# Patient Record
Sex: Female | Born: 1945 | Race: Black or African American | Hispanic: No | Marital: Married | Smoking: Former smoker
Health system: Southern US, Community
[De-identification: ages and names within clinical notes are randomized; demographics above are authoritative.]

## PROBLEM LIST (undated history)

## (undated) DIAGNOSIS — T8859XA Other complications of anesthesia, initial encounter: Secondary | ICD-10-CM

## (undated) DIAGNOSIS — R3129 Other microscopic hematuria: Secondary | ICD-10-CM

## (undated) DIAGNOSIS — C801 Malignant (primary) neoplasm, unspecified: Secondary | ICD-10-CM

## (undated) DIAGNOSIS — K635 Polyp of colon: Secondary | ICD-10-CM

## (undated) DIAGNOSIS — D3502 Benign neoplasm of left adrenal gland: Secondary | ICD-10-CM

## (undated) DIAGNOSIS — K769 Liver disease, unspecified: Secondary | ICD-10-CM

## (undated) DIAGNOSIS — I639 Cerebral infarction, unspecified: Secondary | ICD-10-CM

## (undated) DIAGNOSIS — Z87442 Personal history of urinary calculi: Secondary | ICD-10-CM

## (undated) DIAGNOSIS — N200 Calculus of kidney: Secondary | ICD-10-CM

## (undated) DIAGNOSIS — K219 Gastro-esophageal reflux disease without esophagitis: Secondary | ICD-10-CM

## (undated) HISTORY — DX: Polyp of colon: K63.5

## (undated) HISTORY — DX: Liver disease, unspecified: K76.9

## (undated) HISTORY — PX: ABDOMINAL HYSTERECTOMY: SHX81

## (undated) HISTORY — DX: Benign neoplasm of left adrenal gland: D35.02

## (undated) HISTORY — DX: Calculus of kidney: N20.0

## (undated) HISTORY — PX: APPENDECTOMY: SHX54

## (undated) HISTORY — DX: Other microscopic hematuria: R31.29

---
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
## 1998-06-21 ENCOUNTER — Ambulatory Visit (HOSPITAL_COMMUNITY): Admission: RE | Admit: 1998-06-21 | Discharge: 1998-06-21 | Payer: Self-pay | Admitting: Family Medicine

## 1998-07-05 ENCOUNTER — Encounter: Payer: Self-pay | Admitting: Family Medicine

## 1998-07-05 ENCOUNTER — Ambulatory Visit (HOSPITAL_COMMUNITY): Admission: RE | Admit: 1998-07-05 | Discharge: 1998-07-05 | Payer: Self-pay | Admitting: Family Medicine

## 1998-09-23 ENCOUNTER — Other Ambulatory Visit: Admission: RE | Admit: 1998-09-23 | Discharge: 1998-09-23 | Payer: Self-pay | Admitting: *Deleted

## 1999-12-12 ENCOUNTER — Other Ambulatory Visit: Admission: RE | Admit: 1999-12-12 | Discharge: 1999-12-12 | Payer: Self-pay | Admitting: *Deleted

## 2000-02-26 ENCOUNTER — Ambulatory Visit (HOSPITAL_COMMUNITY): Admission: RE | Admit: 2000-02-26 | Discharge: 2000-02-26 | Payer: Self-pay | Admitting: *Deleted

## 2000-02-26 ENCOUNTER — Encounter: Payer: Self-pay | Admitting: *Deleted

## 2000-03-13 ENCOUNTER — Encounter (INDEPENDENT_AMBULATORY_CARE_PROVIDER_SITE_OTHER): Payer: Self-pay

## 2000-03-13 ENCOUNTER — Ambulatory Visit (HOSPITAL_COMMUNITY): Admission: RE | Admit: 2000-03-13 | Discharge: 2000-03-13 | Payer: Self-pay | Admitting: *Deleted

## 2001-01-26 ENCOUNTER — Other Ambulatory Visit: Admission: RE | Admit: 2001-01-26 | Discharge: 2001-01-26 | Payer: Self-pay | Admitting: *Deleted

## 2001-02-03 ENCOUNTER — Encounter: Payer: Self-pay | Admitting: Emergency Medicine

## 2001-02-03 ENCOUNTER — Emergency Department (HOSPITAL_COMMUNITY): Admission: EM | Admit: 2001-02-03 | Discharge: 2001-02-03 | Payer: Self-pay | Admitting: Emergency Medicine

## 2001-09-05 ENCOUNTER — Encounter: Admission: RE | Admit: 2001-09-05 | Discharge: 2001-09-05 | Payer: Self-pay | Admitting: *Deleted

## 2002-02-09 ENCOUNTER — Other Ambulatory Visit: Admission: RE | Admit: 2002-02-09 | Discharge: 2002-02-09 | Payer: Self-pay | Admitting: *Deleted

## 2002-02-14 ENCOUNTER — Ambulatory Visit (HOSPITAL_COMMUNITY): Admission: RE | Admit: 2002-02-14 | Discharge: 2002-02-14 | Payer: Self-pay | Admitting: *Deleted

## 2002-02-14 ENCOUNTER — Encounter: Payer: Self-pay | Admitting: *Deleted

## 2002-03-27 ENCOUNTER — Encounter (INDEPENDENT_AMBULATORY_CARE_PROVIDER_SITE_OTHER): Payer: Self-pay | Admitting: Specialist

## 2002-03-27 ENCOUNTER — Inpatient Hospital Stay (HOSPITAL_COMMUNITY): Admission: RE | Admit: 2002-03-27 | Discharge: 2002-03-31 | Payer: Self-pay | Admitting: *Deleted

## 2003-03-27 ENCOUNTER — Ambulatory Visit (HOSPITAL_COMMUNITY): Admission: RE | Admit: 2003-03-27 | Discharge: 2003-03-27 | Payer: Self-pay | Admitting: Family Medicine

## 2003-03-29 ENCOUNTER — Other Ambulatory Visit: Admission: RE | Admit: 2003-03-29 | Discharge: 2003-03-29 | Payer: Self-pay | Admitting: *Deleted

## 2004-01-10 ENCOUNTER — Encounter: Admission: RE | Admit: 2004-01-10 | Discharge: 2004-01-10 | Payer: Self-pay | Admitting: Family Medicine

## 2004-07-16 ENCOUNTER — Other Ambulatory Visit: Admission: RE | Admit: 2004-07-16 | Discharge: 2004-07-16 | Payer: Self-pay | Admitting: Obstetrics and Gynecology

## 2006-01-06 ENCOUNTER — Encounter: Payer: Self-pay | Admitting: Family Medicine

## 2007-04-23 ENCOUNTER — Emergency Department (HOSPITAL_COMMUNITY): Admission: EM | Admit: 2007-04-23 | Discharge: 2007-04-23 | Payer: Self-pay | Admitting: Emergency Medicine

## 2007-09-30 ENCOUNTER — Encounter: Admission: RE | Admit: 2007-09-30 | Discharge: 2007-09-30 | Payer: Self-pay | Admitting: Obstetrics and Gynecology

## 2008-07-30 ENCOUNTER — Ambulatory Visit: Payer: Self-pay | Admitting: Internal Medicine

## 2008-08-13 ENCOUNTER — Encounter: Payer: Self-pay | Admitting: Internal Medicine

## 2008-08-13 ENCOUNTER — Ambulatory Visit: Payer: Self-pay | Admitting: Internal Medicine

## 2008-08-15 ENCOUNTER — Encounter: Payer: Self-pay | Admitting: Internal Medicine

## 2011-01-23 NOTE — Op Note (Signed)
[facility]  Patient:    Tonya [name], Tonya [name] Visit Number: [id number] MRN: [id number]          Service Type: GYN Location: 9300 [id number] Attending Physician:  Donne Hazel Dictated by:   Willey Blade, M.D. Proc. Date: 03/27/02 Admit Date:  03/27/2002                             Operative Report  PREOPERATIVE DIAGNOSIS:       Left ovarian cyst.  POSTOPERATIVE DIAGNOSIS:      Left ovarian cyst.  PROCEDURES:                   1. Laparotomy.                               2. Left salpingo-oophorectomy.                               3. Lysis of adhesions.  SURGEON:                      Willey Blade, M.D.  ASSISTANT:                    Duke Salvia. Marcelle Overlie, M.D.  ANESTHESIA:                   General.  ESTIMATED BLOOD LOSS:         100 cc.  COMPLICATIONS:                None.  FINDINGS:                     At the time of laparotomy, pelvic adhesions were identified.  The greater omentum was densely adherent to the anterior abdominal wall.  The left ovary was enlarged to about 6 cm in size and densely adherent to the left pelvic side wall and the pelvic floor.  Dense adhesions were noted between the sigmoid colon and the left ovary.  The ovarian cyst was 6 cm in size, clear and simple in appearance.  This was removed intact.  Otherwise, the uterus and right adnexa was surgical absent.  No known abnormality was identified.  DESCRIPTION OF PROCEDURE:     The patient was taken to the operating room, where general endotracheal anesthetic was administered.  The patient was placed on the operating table in the supine position.  The abdomen was prepped and draped in the usual sterile fashion with Betadine and sterile drapes.  A Foley catheter was inserted.  The patient received a preoperative antibiotic. The abdomen was entered through a Pfannenstiel incision and carried down sharply in the usual fashion.  The peritoneum was atraumatically  entered.  A self-retaining retractor was then placed and the bowel was packed away with laparotomy packs.  The retroperitoneal space was entered sharply on the left and the ovary was dissected free from the left pelvic side wall, the pelvic floor and the colon.  Careful surgical technique was undertaken with this dissection.  The infundibulopelvic ligament was isolated, clamped with a Heaney clamp and doubly ligated with two free ties of 0 Vicryl suture.  The ovary was then dissected from the pelvic floor and submitted for pathologic examination.  This did appear to  be a simple ovarian cyst.  The pelvic adhesions were quite dense between this and the pelvic side wall.  The ureter was identified during this dissection.  Hemostasis was then achieved.  The pelvis was thoroughly irrigated with copious amounts of irrigant.  Hemostasis was noted.  Attention was then turned to closure.  All abdominal instruments and packs were removed from the pelvis.  Good hemostasis was again noted.  The rectus muscle and anterior peritoneum was closed with three interrupted sutures of 0 Vicryl.  The muscle fascia was then closed with two sutures of #1 Vicryl in a running fashion.  Two sutures were placed at the periphery of the incision and, with running stitches, meeting in the midline.    The subcutaneous tissue was irrigated and made hemostatic using the Bovie cautery. The skin was reapproximated with staples and a sterile dressing applied.  Final sponge, needle and instrument counts were correct x3.  There were no perioperative complications.  The surgery went well. Dictated by:   Willey Blade, M.D. Attending Physician:  Donne Hazel DD:  03/27/02 TD:  03/29/02 Job: [id number]

## 2011-01-23 NOTE — Discharge Summary (Signed)
   NAMESTEPHAN, Tonya [name]                        ACCOUNT NO.:  [id number]   MEDICAL RECORD NO.:  [id number]                   PATIENT TYPE:   LOCATION:                                       FACILITY:   PHYSICIAN:  Tracie Harrier, M.D.              DATE OF BIRTH:   DATE OF ADMISSION:  03/27/2002  DATE OF DISCHARGE:                                 DISCHARGE SUMMARY   HISTORY OF PRESENT ILLNESS:  The patient is a 65 year old female admitted  for laparotomy and left salpingo-oophorectomy for a persistent left ovarian  cyst which has caused her great discomfort.  She is a smoker but otherwise  very healthy.  Informed consent was given on this ovarian cyst which is  simple in nature and no evidence of carcinoma.   PAST MEDICAL HISTORY:  1. History of smoking.  2. History of GERD.   PAST SURGICAL HISTORY:  1. Status post TAH/RSO.  2. Vaginal delivery x3.  3. Appendectomy.  4. Status post laparoscopy with left ovarian cystectomy.   OBSTETRICAL HISTORY:  Normal spontaneous vaginal delivery x3 at term.   CURRENT MEDICATIONS:  None.   ALLERGIES:  PENICILLIN.   PHYSICAL EXAMINATION:  Please seen clinic admission History and Physical.   ADMISSION DIAGNOSES:  1. Left ovarian cyst.  2. Pelvic pain.   HOSPITAL COURSE:  The patient was admitted on the same day of surgery, March 27, 2002, where she underwent a left salpingo-oophorectomy.  Pelvic  adhesions were encountered but the surgery went well and uneventfully.  The  patient had a slow postoperative course but it otherwise went well.  She did  have a low-grade fever during her hospitalization which was felt secondary  to atelectasis.  She resumed normal bowel and bladder function and is  discharged routinely postoperative day #4 in stable condition.  Pathology  came back benign.  She had no real complications and was discharged  routinely in stable condition on postop day #4 as mentioned.  Her hemoglobin  stabilized at 9.8.   DISCHARGE DIAGNOSIS:  Status post laparotomy with left salpingo-oophorectomy-  -stable.   PLAN:  1. Home.  2.     Routine instructions given.  3. Percocet dispense #40.  4. Followup in the office in one week for a routine postop and incision     check.                                               Tracie Harrier, M.D.    REG/MEDQ  D:  05/06/2002  T:  05/06/2002  Job:  [id number]

## 2011-01-23 NOTE — Op Note (Signed)
[facility]  Patient:    Tonya [name], Tonya [name]                     MRN: [id number] Proc. Date: 03/13/00 Adm. Date:  [id number] Attending:  Donne Hazel                           Operative Report  PREOPERATIVE DIAGNOSIS:       Symptomatic left ovarian cyst.  POSTOPERATIVE DIAGNOSIS:      Symptomatic left ovarian cyst with benign appearance.  PROCEDURE:                    1. Laparoscopic left ovarian cystectomy.                               2. Lysis of adhesions.  SURGEON:                      Dr. Alan Mulder  ANESTHESIA:                   General endotracheal.  ESTIMATED BLOOD LOSS:         Less than 20 cc.  COMPLICATIONS:                None.  FINDINGS:                     At time of surgery, a large left ovarian cyst was encountered.  This was approximately 7 cm in size.  It was benign in appearance.  The cyst was fluid filled and smooth walled without worrisome areas of malignancy.  This did appear to be completely benign.  The uterus and right adnexa were surgically absent.  The left ovary was densely adherent to the sigmoid colon.  DESCRIPTION OF PROCEDURE:     The patient was taken to the operating room where general endotracheal anesthetic was administered.  The patient was placed on the operating table in the dorsal lithotomy position.  The abdomen, perineum and vagina was prepped and draped in the usual sterile fashion with Betadine and sterile drapes.  The bladder was emptied with a red rubber catheter.  A sponge forceps was placed in the vagina to identify the vaginal cuff.  Next, a small vertical infraumbilical skin incision was made through which a Veresss needle was inserted atraumatically.  The Veres needle was then used to create a pneumoperitoneum by insufflating 2.2 liters of carbon dioxide gas.  This was done atraumatically.  The Veress needle was removed and a disposable laparoscopic trocar was inserted atraumatically  into the abdominal cavity.  Next, an accessory incision was made two fingerbreadths above the pubic symphysis and in the midline.  Through this a 5 mm trocar was placed under direct, continuous laparoscopic guidance.  Inspection of the abdomen was undertaken.  The left ovarian cyst was encountered.  This was grasped and elevated into the operative field.  The ovarian cortex was cauterized with bipolar cautery after a biopsy of the cyst wall was obtained.  The cyst wall was removed and approximately 1/3 and this was removed through the laparoscopic sheath.  The base of the cyst wall was then cauterized.  Good hemostasis was noted.  There were some pelvic adhesions encountered and these were lysed.  These were most notably between the left ovary and the  left pelvic side wall.  As mentioned, the ovary was densely adherent to the sigmoid colon.  If this ovary was to be removed, it would be best approached through an open procedure.  At the end of the cystectomy and lysis of adhesions, the pelvis was visualized with good hemostasis noted.  Attention was then turned to closure.  All the gas was allowed to escape from the abdomen and all abdominal instruments were removed.  The infraumbilical skin incision was closed, first with two interrupted sutures of #0 Vicryl on the muscle fascia, then the skin was reapproximated with interrupted sutures of 3-0 Vicryl ______.  The patient was awakened, extubated and taken to the recovery room in good condition. There were no preoperative complications.  DD:  03/13/00 TD:  03/13/00 Job: [id number]

## 2011-01-23 NOTE — H&P (Signed)
[facility]  Patient:    Tonya [name], Tonya [name] Visit Number: [id number] MRN: [id number]          Service Type: GYN Location: [phone] Attending Physician:  Donne Hazel Dictated by:   Willey Blade, M.D. Admit Date:  03/27/2002                           History and Physical  HISTORY OF PRESENT ILLNESS:   The patient is a 65 year old postmenopausal female status post TAH and RSO.  She is admitted for laparotomy with left salpingo-oophorectomy for a persistent left ovarian cyst which has caused her great discomfort.  She is otherwise pretty healthy.  She is a smoker.  The patient was given informed consent.  This large ovarian cyst is extremely simple in nature and the patient has a history of a similar right ovarian cyst.  MEDICAL HISTORY:              History of GERD.  SURGICAL HISTORY:             1. Status post TAH/RSO.                               2. Vaginal delivery x3.                               3. Appendectomy.                               4. Status post laparoscopy with left ovarian                                  cystectomy.  OBSTETRICAL HISTORY:          Normal spontaneous vaginal delivery x3 at term.  CURRENT MEDICATIONS:          None.  ALLERGIES:                    PENICILLIN.  PHYSICAL EXAMINATION:  VITAL SIGNS:                  Stable, temperature 98, pulse 79, respirations 16, blood pressure 105/68.  GENERAL:                      She is a well-developed, well-nourished female in no acute distress.  HEENT:                        Within normal limits.  NECK:                         Supple without adenopathy or thyromegaly.  HEART:                        Regular rate and rhythm without murmur, gallop, or rub.  LUNGS:                        Clear.  BREASTS:                      No masses, nipple  discharge, or axillary adenopathy.  ABDOMEN:                      Soft and benign.  PELVIC:                        Normal external female genitalia, vagina clear. The uterus and right adnexa are surgically absent.  The left adnexa is full with a left adnexal fullness appreciated.  This is tender to palpation.  The rectal confirms.  EXTREMITIES AND NEUROLOGIC:   Grossly normal.  LABORATORY DATA:              Ultrasound reveals a 6 cm left ovarian cyst which is simple in nature.  ADMITTING DIAGNOSIS:          Left ovarian cyst - simple in nature.  PLAN:                         Laparotomy with left salpingo-oophorectomy.  DISCUSSION:                   The risks and benefits of this surgery explained to the patient.  I have done laparoscopy on this patient in the past and pelvic adhesions were noted.  I have recommended laparotomy with left salpingo-oophorectomy in this situation.  The patient wishes to proceed. Questions were answered. Dictated by:   Willey Blade, M.D. Attending Physician:  Donne Hazel DD:  03/27/02 TD:  03/27/02 Job: [id number]

## 2011-02-12 ENCOUNTER — Ambulatory Visit (HOSPITAL_BASED_OUTPATIENT_CLINIC_OR_DEPARTMENT_OTHER)
Admission: RE | Admit: 2011-02-12 | Discharge: 2011-02-12 | Disposition: A | Discharge status: Home / Self Care | Payer: BC Managed Care – PPO | Source: Ambulatory Visit | Attending: Orthopedic Surgery | Admitting: Orthopedic Surgery

## 2011-02-12 DIAGNOSIS — G5620 Lesion of ulnar nerve, unspecified upper limb: Secondary | ICD-10-CM | POA: Insufficient documentation

## 2011-02-12 DIAGNOSIS — Z01812 Encounter for preprocedural laboratory examination: Secondary | ICD-10-CM | POA: Insufficient documentation

## 2011-02-12 DIAGNOSIS — F17200 Nicotine dependence, unspecified, uncomplicated: Secondary | ICD-10-CM | POA: Insufficient documentation

## 2011-02-17 NOTE — Op Note (Signed)
Tonya [name], [name]            ACCOUNT NO.:  [id number]  MEDICAL RECORD NO.:  [id number]  LOCATION:                                 FACILITY:  PHYSICIAN:  Katy Fitch. Duaa Stelzner, M.D.      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  OPERATION:  Anterior subcutaneous transposition of right ulnar nerve.  OPERATING SURGEON:  Katy Fitch. Verdis Bassette, MD  ASSISTANT:  Marveen Reeks Dasnoit, PA  ANESTHESIA:  General by LMA supplemented by a right plexus block.  SUPERVISING ANESTHESIOLOGIST:  Janetta Hora. Gelene Mink, MD  INDICATIONS:  Tonya [name] is a 65 year old woman employed as a Merchandiser, retail at [employer].  She has a history of increasing numbness and weakness of her right hand.  She was referred by her primary care physician, Alwyn Pea, for evaluation and management. Clinical examination revealed signs of profound ulnar intrinsic muscle atrophy and loss of sensibility in the ulnar nerve distribution of the right upper extremity distal to the elbow.  She was noted have a markedly positive Tinel sign at the cubital tunnel and positive elbow hyperflexion test.  Electrodiagnostic study was completed on Jan 19, 2011, documented profound ulnar neuropathy with a conduction velocity of only 22 meters per second on the right versus a normal conduction velocity on the left.  We recommended that Tonya [name] undergo exploration of her ulnar nerve anticipating an anterior transposition.  We discussed the various techniques with her.  Our first choice  will be anterior subcutaneous transposition.  After informed consent, she was brought to the operating room at this time.  Preoperatively, questions were invited and answered in detail. She has been seen by Dr. Gelene Mink in the holding area.  After informed anesthesia consent, a right plexus block was placed with ultrasound control.  This was well tolerated, leading to excellent anesthesia of the right upper extremity.  PROCEDURE:  Tonya [name] was brought to room one of the [facility] and placed in supine position on the operating table.  Following the induction of general anesthesia by LMA technique under Dr. Thornton Dales direct supervision, the right arm was prepped with Betadine soap and solution and sterilely draped.  A 1 g of vancomycin was administered as an IV prophylactic antibiotic due to a PENICILLIN allergy.  After routine surgical time-out, the right arm was exsanguinated with Esmarch bandage and arterial tourniquet to the proximal brachium inflated to 220 mmHg.  Procedure commenced with a 4-cm curvilinear incision paralleling the path of the ulnar nerve posterior to the medial epicondyle. Subcutaneous tissues were divided taking care to identify and spare the posterior branch of the medial antebrachial cutaneous nerve.  The ulnar nerve was identified approximately 6 cm above the medial epicondyle.  This was dissected and decompressed distally 4 cm below the epicondyle.  The flexor carpi ulnaris  was resected.  The medial brachial intermuscular septum was identified, neurovascular structures protected, and septum excised with cutting cautery.  With great care using a silicone vessel loop, the nerve was mobilized with a vascular pedicle transposing it anterior to the medial epicondyle into a subcutaneous pocket.  Care was taken to preserve all of the epineural vessels.  Great care was taken to assure that there no kinking vessels or other fascial loops  proximal and distally.  After the nerve was completely transposed to a proper bed of fascia and fat, a flap was created incorporating the superficial fascia which was sewn to the epicondyle creating a retaining wall not allowing the nerve to sublux posteriorly.  The ulnar nerve was noted to be swollen to approximately 60% greater diameter directly at the epicondyle.  Prior to transposition, we noted that the nerve was shifting anteriorly with double flexion beyond 90 degrees through an amplitude of approximately 1 cm.  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  After completion of transposition, bleeding points were electrocauterized with bipolar current followed by repair of the skin with subcutaneous suture of 3-0 Vicryl and intradermal 3-0 Prolene. Compressive dressing was applied with Steri-Strips, sterile gauze, Tegaderm dressing, and Ace wrap.  For aftercare, Tonya [name] will be placed in a sling.  Once her block was off, she will begin immediate range of motion and excise to the elbow and hand.  There were no apparent complications.     Katy Fitch Sala Tague, M.D.     RVS/MEDQ  D:  02/12/2011  T:  02/12/2011  Job:  [id number]  cc:   Tonya [name], M.D.  Electronically Signed by Josephine Igo M.D. on 02/17/2011 02:27:07 PM

## 2011-06-19 LAB — COMPREHENSIVE METABOLIC PANEL
AST: 14
Albumin: 3.5
BUN: 7
Chloride: 108
Creatinine, Ser: 0.55
GFR calc non Af Amer: >60

## 2011-06-19 LAB — CBC
HCT: 35.4 — ABNORMAL LOW
Hemoglobin: 12.1
MCV: 97.7
Platelets: 290
RBC: 3.63 — ABNORMAL LOW
RDW: 12.7

## 2011-06-19 LAB — POCT CARDIAC MARKERS
Myoglobin, poc: 37.7
Operator id: 196461
Troponin i, poc: <0.05

## 2011-06-19 LAB — URINALYSIS, ROUTINE W REFLEX MICROSCOPIC
Bilirubin Urine: NEGATIVE
Nitrite: NEGATIVE
Specific Gravity, Urine: 1.015
Urobilinogen, UA: 0.2

## 2011-06-19 LAB — DIFFERENTIAL
Eosinophils Relative: 2
Lymphocytes Relative: 39
Lymphs Abs: 2.9

## 2012-06-30 ENCOUNTER — Encounter (HOSPITAL_COMMUNITY): Payer: Self-pay | Admitting: *Deleted

## 2012-06-30 ENCOUNTER — Emergency Department (HOSPITAL_COMMUNITY): Payer: BC Managed Care – PPO

## 2012-06-30 ENCOUNTER — Inpatient Hospital Stay (HOSPITAL_COMMUNITY): Payer: BC Managed Care – PPO

## 2012-06-30 ENCOUNTER — Inpatient Hospital Stay (HOSPITAL_COMMUNITY)
Admission: EM | Admit: 2012-06-30 | Discharge: 2012-07-01 | DRG: 035 | Disposition: A | Discharge status: Home / Self Care | Payer: BC Managed Care – PPO | Attending: Internal Medicine | Admitting: Internal Medicine

## 2012-06-30 DIAGNOSIS — R079 Chest pain, unspecified: Secondary | ICD-10-CM

## 2012-06-30 DIAGNOSIS — K219 Gastro-esophageal reflux disease without esophagitis: Secondary | ICD-10-CM | POA: Diagnosis present

## 2012-06-30 DIAGNOSIS — Z79899 Other long term (current) drug therapy: Secondary | ICD-10-CM

## 2012-06-30 DIAGNOSIS — I6350 Cerebral infarction due to unspecified occlusion or stenosis of unspecified cerebral artery: Secondary | ICD-10-CM

## 2012-06-30 DIAGNOSIS — R0789 Other chest pain: Secondary | ICD-10-CM | POA: Diagnosis present

## 2012-06-30 DIAGNOSIS — I6340 Cerebral infarction due to embolism of unspecified cerebral artery: Secondary | ICD-10-CM

## 2012-06-30 DIAGNOSIS — G8190 Hemiplegia, unspecified affecting unspecified side: Secondary | ICD-10-CM

## 2012-06-30 DIAGNOSIS — I498 Other specified cardiac arrhythmias: Secondary | ICD-10-CM | POA: Diagnosis present

## 2012-06-30 DIAGNOSIS — E785 Hyperlipidemia, unspecified: Secondary | ICD-10-CM

## 2012-06-30 DIAGNOSIS — R202 Paresthesia of skin: Secondary | ICD-10-CM | POA: Diagnosis present

## 2012-06-30 DIAGNOSIS — R209 Unspecified disturbances of skin sensation: Principal | ICD-10-CM | POA: Diagnosis present

## 2012-06-30 DIAGNOSIS — F17200 Nicotine dependence, unspecified, uncomplicated: Secondary | ICD-10-CM | POA: Diagnosis present

## 2012-06-30 DIAGNOSIS — G459 Transient cerebral ischemic attack, unspecified: Secondary | ICD-10-CM | POA: Diagnosis present

## 2012-06-30 DIAGNOSIS — R200 Anesthesia of skin: Secondary | ICD-10-CM | POA: Diagnosis present

## 2012-06-30 HISTORY — DX: Gastro-esophageal reflux disease without esophagitis: K21.9

## 2012-06-30 LAB — RAPID URINE DRUG SCREEN, HOSP PERFORMED
Amphetamines: NOT DETECTED
Barbiturates: NOT DETECTED
Benzodiazepines: NOT DETECTED
Tetrahydrocannabinol: NOT DETECTED

## 2012-06-30 LAB — TROPONIN I: Troponin I: <0.3 ng/mL (ref <0.30)

## 2012-06-30 LAB — CBC
HCT: 37.8 % (ref 36.0–46.0)
Hemoglobin: 12.8 g/dL (ref 12.0–15.0)
MCV: 97.2 fL (ref 78.0–100.0)
RBC: 3.89 MIL/uL (ref 3.87–5.11)
WBC: 6.5 10*3/uL (ref 4.0–10.5)

## 2012-06-30 LAB — LIPID PANEL
Cholesterol: 243 mg/dL — ABNORMAL HIGH (ref 0–200)
HDL: 53 mg/dL (ref >39)
Triglycerides: 109 mg/dL (ref <150)

## 2012-06-30 LAB — BASIC METABOLIC PANEL
BUN: 9 mg/dL (ref 6–23)
Calcium: 9.2 mg/dL (ref 8.4–10.5)

## 2012-06-30 MED ORDER — ASPIRIN EC 81 MG PO TBEC
81.0000 mg | DELAYED_RELEASE_TABLET | Freq: Every day | ORAL | Status: DC
  Administered 2012-06-30 – 2012-07-01 (×2): 81 mg via ORAL
  Filled 2012-06-30 (×3): qty 1

## 2012-06-30 MED ORDER — OXYCODONE-ACETAMINOPHEN 5-325 MG PO TABS
1.0000 | ORAL_TABLET | ORAL | Status: DC | PRN
  Administered 2012-06-30 – 2012-07-01 (×4): 1 via ORAL
  Filled 2012-06-30 (×4): qty 1

## 2012-06-30 MED ORDER — ASPIRIN 81 MG PO CHEW
324.0000 mg | CHEWABLE_TABLET | Freq: Once | ORAL | Status: DC
  Filled 2012-06-30: qty 3

## 2012-06-30 MED ORDER — PANTOPRAZOLE SODIUM 40 MG PO TBEC
40.0000 mg | DELAYED_RELEASE_TABLET | Freq: Every day | ORAL | Status: DC
  Administered 2012-07-01: 40 mg via ORAL
  Filled 2012-06-30: qty 1

## 2012-06-30 MED ORDER — ATORVASTATIN CALCIUM 80 MG PO TABS
80.0000 mg | ORAL_TABLET | Freq: Every day | ORAL | Status: DC
  Administered 2012-07-01: 80 mg via ORAL
  Filled 2012-06-30: qty 1

## 2012-06-30 MED ORDER — ACETAMINOPHEN 325 MG PO TABS
650.0000 mg | ORAL_TABLET | ORAL | Status: DC | PRN
  Administered 2012-06-30: 650 mg via ORAL
  Filled 2012-06-30: qty 2

## 2012-06-30 MED ORDER — NITROGLYCERIN 0.4 MG SL SUBL
0.4000 mg | SUBLINGUAL_TABLET | SUBLINGUAL | Status: DC | PRN
  Administered 2012-06-30: 0.4 mg via SUBLINGUAL
  Filled 2012-06-30: qty 25

## 2012-06-30 NOTE — ED Notes (Signed)
Pt denies chest pain, shortness of breath, dizziness, nausea, lightheadedness. Pt c/o slight headache from not eating and from Nitro. Pt is mentating appropriately at this time.

## 2012-06-30 NOTE — ED Provider Notes (Signed)
History     CSN: [id number]  Arrival date & time 06/30/12  1350   None     Chief Complaint  Patient presents with  . Numbness    (Consider location/radiation/quality/duration/timing/severity/associated sxs/prior treatment) HPI Comments: Patient presents today with a chief complaint of numbness and tingling of the left side of her body.  She states that last evening around 11 pm she developed numbness and tingling of her left arm.  When she woke up this morning around 6 AM she had numbness and tingling of her left leg, left side of her tongue, and left side of her lips.  The numbness and tingling have been constant since that time.  She also states that approximately 7 hours ago she developed pressure of the left anterior chest.  Pain not associated with any SOB, nausea, vomiting, or diaphoresis.  No dizziness, lightheadedness, or syncope.  She denies any weakness.  Denies slurred speech.  Denies dysphagia.  Denies facial asymmetry.  Patient denies prior history of CVA, TIA, or cardiac disease.  She does have a history of Hyperlipidemia.  She also smokes daily.  No prior history of HTN or DM.    The history is provided by the patient.    History reviewed. No pertinent past medical history.  Past Surgical History  Procedure Date  . Abdominal hysterectomy   . Appendectomy     No family history on file.  History  Substance Use Topics  . Smoking status: Current Every Day Smoker  . Smokeless tobacco: Not on file  . Alcohol Use: Yes    OB History    Grav Para Term Preterm Abortions TAB SAB Ect Mult Living                  Review of Systems  Constitutional: Negative for fever and chills.  HENT: Negative for neck pain and neck stiffness.   Eyes: Negative for visual disturbance.  Respiratory: Negative for shortness of breath.   Cardiovascular: Positive for chest pain.  Gastrointestinal: Negative for nausea and vomiting.  Musculoskeletal: Negative for back pain and gait  problem.  Neurological: Positive for numbness. Negative for dizziness, syncope, facial asymmetry, speech difficulty, weakness, light-headedness and headaches.  Psychiatric/Behavioral: Negative for confusion.    Allergies  Penicillins  Home Medications   Current Outpatient Rx  Name Route Sig Dispense Refill  . CALCIUM + D PO Oral Take 1 tablet by mouth daily.    . ADULT MULTIVITAMIN W/MINERALS CH Oral Take 1 tablet by mouth daily.    . OXYCODONE-ACETAMINOPHEN 7.5-325 MG PO TABS Oral Take 1 tablet by mouth every 4 (four) hours as needed.    Marland Kitchen RABEPRAZOLE SODIUM 20 MG PO TBEC Oral Take 20 mg by mouth daily.    Marland Kitchen ROSUVASTATIN CALCIUM 10 MG PO TABS Oral Take 10 mg by mouth daily.      BP 98/70  Pulse 56  Temp 97.8 F (36.6 C) (Oral)  Resp 20  Ht 5\' 4"  (1.626 m)  Wt 148 lb (67.132 kg)  BMI 25.40 kg/m2  SpO2 98%  Physical Exam  Nursing note and vitals reviewed. Constitutional: She is oriented to person, place, and time. She appears well-developed and well-nourished. No distress.  HENT:  Head: Normocephalic and atraumatic.  Mouth/Throat: Oropharynx is clear and moist.  Eyes: EOM are normal. Pupils are equal, round, and reactive to light.  Neck: Normal range of motion. Neck supple.  Cardiovascular: Normal rate, regular rhythm and normal heart sounds.   Pulmonary/Chest: Effort normal  and breath sounds normal. No respiratory distress. She has no wheezes. She has no rales. She exhibits no tenderness.  Neurological: She is alert and oriented to person, place, and time. She has normal strength. No cranial nerve deficit or sensory deficit. Coordination and gait normal.       No pronator drift No visual field defect No ataxia Normal rapid alternating movements Normal finger to nose testing  Skin: Skin is warm and dry. She is not diaphoretic.  Psychiatric: She has a normal mood and affect.    ED Course  Procedures (including critical care time)   Labs Reviewed  CBC  POCT I-STAT  TROPONIN I  BASIC METABOLIC PANEL   No results found.   No diagnosis found.   Date: 06/30/2012  Rate: 59  Rhythm: sinus bradycardia  QRS Axis: normal  Intervals: normal  ST/T Wave abnormalities: nonspecific T wave changes  Conduction Disutrbances:none  Narrative Interpretation:   Old EKG Reviewed: changes present, new t wave inversion in leads II and III  Discussed patient with Dr. Rhunette Croft.  He recommends consulting Neurology.  3:46 PM Discussed with Dr. Thad Ranger who reports that she will come see the patient in the ED.  5:59 PM Discussed with Hospitalist.  They report that they will come see the patient in the ED.  MDM  Patient presents to the ED today with a chief complaint of numbness/tingling of the left side of her body.  Onset of symptoms last evening.  No weakness, facial asymmetry, or dysphagia.  Consulted Neurology who recommended the patient having a MRI, MRA, Echo, and Carotid Doppler.  Patient admitted to Internal Medicine service for further management and work up.  Patient also presenting with chest pain.  Patient given Aspirin and NG while in the ED, which helped with the pain somewhat.  Initial and 3 hour troponin negative.          Pascal Lux [place], PA-C 07/01/12 1255

## 2012-06-30 NOTE — Consult Note (Signed)
Referring Physician: Dr. Rhunette Croft    Chief Complaint: left sided numbness/weakness  HPI: Tonya [name] is an 66 y.o. female who noted left sided weakness/numbness of extremities around 2230 on 06/29/2012. She went to bed and awoke with the same. She then went to work and around 0800 today (06/30/2012) she noted that her symptoms were worse and that her face was now included. She did not have problems swallowing or speaking. She did not have difficulty with gait. She came home at lunch and asked her husband to bring her to the hospital. She did take a ASA 325mg  at bedtime last night and again this morning. She does not take aspirin usually.  LSN: [id number] on 06/29/2012 tPA Given: No: Out of window.  Past Medical History: Hyperlipidemia  Past Surgical History  Procedure Date  . Abdominal hysterectomy   . Appendectomy     Family history:  Maternal aunt with stroke  Social History:  reports that she has been smoking.  She does not have any smokeless tobacco history on file. She reports that she drinks alcohol. She reports that she does not use illicit drugs.  Allergies:  Allergies  Allergen Reactions  . Penicillins     REACTION: hives    Current Facility-Administered Medications  Medication Dose Route Frequency Provider Last Rate Last Dose  . aspirin chewable tablet 324 mg  324 mg Oral Once Magnus Sinning, PA-C       Current Outpatient Prescriptions  Medication Sig Dispense Refill  . Calcium Carbonate-Vitamin D (CALCIUM + D PO) Take 1 tablet by mouth daily.      . Multiple Vitamin (MULTIVITAMIN WITH MINERALS) TABS Take 1 tablet by mouth daily.      Marland Kitchen oxyCODONE-acetaminophen (PERCOCET) 7.5-325 MG per tablet Take 1 tablet by mouth every 4 (four) hours as needed.      . RABEprazole (ACIPHEX) 20 MG tablet Take 20 mg by mouth daily.      . rosuvastatin (CRESTOR) 10 MG tablet Take 10 mg by mouth daily.        ROS: History obtained from spouse and chart review  General ROS:  negative for - chills, fatigue, fever, night sweats, weight gain or weight loss Psychological ROS: negative for - behavioral disorder, hallucinations, memory difficulties, mood swings or suicidal ideation Ophthalmic ROS: negative for - blurry vision, double vision, eye pain or loss of vision ENT ROS: negative for - epistaxis, nasal discharge, oral lesions, sore throat, tinnitus or vertigo Allergy and Immunology ROS: negative for - hives or itchy/watery eyes Hematological and Lymphatic ROS: negative for - bleeding problems, bruising or swollen lymph nodes Endocrine ROS: negative for - galactorrhea, hair pattern changes, polydipsia/polyuria or temperature intolerance Respiratory ROS: negative for - cough, hemoptysis, shortness of breath or wheezing Cardiovascular ROS: negative for - chest pain, dyspnea on exertion, edema or irregular heartbeat Gastrointestinal ROS: negative for - abdominal pain, diarrhea, hematemesis, nausea/vomiting or stool incontinence Genito-Urinary ROS: negative for - dysuria, hematuria, incontinence or urinary frequency/urgency Musculoskeletal ROS: negative for - joint swelling, +muscular weakness (left body) with numbness including face Neurological ROS: as noted in HPI Dermatological ROS: negative for rash and skin lesion changes    Physical Examination: Blood pressure 98/70, pulse 56, temperature 97.8 F (36.6 C), temperature source Oral, resp. rate 20, height 5\' 4"  (1.626 m), weight 67.132 kg (148 lb), SpO2 98.00%.  Neurologic Examination: Mental Status: Alert, oriented, thought content appropriate.  Speech fluent without evidence of aphasia.  Able to follow 3 step commands without difficulty.  Cranial Nerves: II: visual fields grossly normal, pupils equal, round, reactive to light and accommodation III,IV, VI: ptosis not present, extra-ocular motions intact bilaterally V,VII: smile symmetric, facial light touch sensation abnormal, describes feeling numb on left,  splits midline. VIII: hearing normal bilaterally IX,X: gag reflex present XI: trapezius strength/neck flexion strength normal bilaterally XII: tongue strength normal  Motor: Right : Upper extremity   5/5    Left:     Upper extremity   4+/5  Lower extremity   5/5     Lower extremity   4+/5 Tone and bulk:normal tone throughout; no atrophy noted Sensory: Pinprick and light touch diminished left body including the trunk Deep Tendon Reflexes: 2+ and symmetric throughout Plantars: Right: downgoing   Left: downgoing Cerebellar: normal finger-to-nose, normal heel-to-shin test.    Results for orders placed during the hospital encounter of 06/30/12 (from the past 48 hour(s))  CBC     Status: Normal   Collection Time   06/30/12  2:12 PM      Component Value Range Comment   WBC 6.5  4.0 - 10.5 K/uL    RBC 3.89  3.87 - 5.11 MIL/uL    Hemoglobin 12.8  12.0 - 15.0 g/dL    HCT 16.1  09.6 - 04.5 %    MCV 97.2  78.0 - 100.0 fL    MCH 32.9  26.0 - 34.0 pg    MCHC 33.9  30.0 - 36.0 g/dL    RDW 40.9  81.1 - 91.4 %    Platelets 277  150 - 400 K/uL   BASIC METABOLIC PANEL     Status: Abnormal   Collection Time   06/30/12  2:12 PM      Component Value Range Comment   Sodium 137  135 - 145 mEq/L    Potassium 4.0  3.5 - 5.1 mEq/L    Chloride 103  96 - 112 mEq/L    CO2 23  19 - 32 mEq/L    Glucose, Bld 100 (*) 70 - 99 mg/dL    BUN 9  6 - 23 mg/dL    Creatinine, Ser 7.82  0.50 - 1.10 mg/dL    Calcium 9.2  8.4 - 95.6 mg/dL    GFR calc non Af Amer >90  >90 mL/min    GFR calc Af Amer >90  >90 mL/min   POCT I-STAT TROPONIN I     Status: Normal   Collection Time   06/30/12  2:28 PM      Component Value Range Comment   Troponin i, poc 0.01  0.00 - 0.08 ng/mL    Comment 3             Dg Chest 2 View 06/30/2012  Enlargement of cardiac silhouette. Minimal bronchitic and interstitial changes of uncertain acuity. No definite focal infiltrate identified.   Original Report Authenticated By: Lollie Marrow,  M.D.    Job Founds, MBA, MHA [facility] Pager [phone]   Patient seen and examined.  Clinical course and management discussed.  Necessary edits performed.  I agree with the above.   Assessment and plan discussed below.   Assessment: 66 y.o. female with left sided weakness and numbness now for 18 hours.  CT unremarkable.  Suspect right brain infarct.  Further work up recommended.  Patient on no antiplatelet therapy at home.    Stroke Risk Factors - hyperlipidemia  Plan: 1. HgbA1c, fasting lipid panel 2. MRI, MRA  of the brain without contrast 3. PT  consult, OT consult 4. Echocardiogram 5. Carotid dopplers 6. Prophylactic therapy-Antiplatelet med: Aspirin - dose 325mg  daily 7. Risk factor modification.  Smoking cessation couseling 8. Telemetry monitoring 9. Frequent neuro checks   Thana Farr, MD [facility] [phone] 06/30/2012  7:08 PM

## 2012-06-30 NOTE — H&P (Signed)
Hospital Admission Note Date: 06/30/2012  Patient name: Tonya [name] Medical record number: [id number] Date of birth: 23-Oct-1945 Age: 66 y.o. Gender: female PCP: No primary provider on file.  Medical Service: Internal Medicine Teaching Service  Attending physician:  Lars Mage, MD     1st Contact: Dr Dow Adolph   Pager: [phone] 2nd Contact: Dr Elberta Fortis   Pager:[phone] After 5 pm or weekends: 1st Contact:      Pager: [phone] 2nd Contact:      Pager: [phone]  Chief Complaint: Reduced left sided for about 18hrs and chest pain.  History of Present Illness: [name] is a 66 year old woman, with past medical history only significant for hyperlipidemia, and GERD , who presents with left-sided tingling. She reports that last night at about 10 PM, she started experiencing some tingling from the left side of her face, left and left leg. In addition, she has also noticed numbness in her tongue, but she did notice any difficulties moving her tongue or swallowing. She went to bed and when she woke up at around 6 AM, she continued to experience the same symptoms and she believed they had only slightly worsened. However, she still felt that it would improve as the day goes on. She went to work, but the symptoms did not improve. She drove home and requested her her husband to bring her to the ED. She thinks she could have driven herself. She denies history of weakness, imbalance, dizziness, visual changes, or hearing deficits. She denies history of falls. She does not report any history of slurred speech or difficulty finding words. She denies previous history of similar symptoms. She denies any symptoms on the right side - ' my body feels like is split into two; with the right side completely normal' Her only medications include Crestor started 6 months ago, and antiacids.   She also complained of chest discomfort, which she describes as pressure and tightness, that is mainly located in  the central chest and to the left side of her chest as well. The chest pain started in the morning and it progressively got worse. It did not improve with aspirin she took at home. Her pain does not radiate to the left shoulder or jaw. However, she has some area of burning sensation on the left side of her neck which started after she has taken Nitroglycerin in the ED. The Nitroglycerin improved her chest pain. She denies history of palpitations, nausea, vomiting, or shortness of breath. She denies history of orthopnea, paroxysmal dyspnea or edema. This is the first episode of such chest tightness in her life. Of note, she was surprised that her blood pressure was a bit elevated in the range of 120s over 70. Her usual BP is 90s over 60s.  She frequently sees her doctor, who is Dr. Alwyn Pea for annual physicals who is her PCP. The last time she saw her last month.   She has a remote surgical history of hysterectomy and appendicectomy. Otherwise, she has been healthy.   She smokes about 6-7 cigarettes per day since she was a teen. She denies history of recreational drug use. She drinks alcohol only about 3 times in a year.  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 She lives with her husband. She does not have health insurance. She has 3 children of 48, 45 and 44, all in good health.There is a history of a stroke in the maternal auntie. Her father passed away  at age 3 and he had a pacemaker and the mother has heart disease, and she is still living at the age of 42.   Meds: Current Outpatient Rx  Name Route Sig Dispense Refill  . CALCIUM + D PO Oral Take 1 tablet by mouth daily.    . ADULT MULTIVITAMIN W/MINERALS CH Oral Take 1 tablet by mouth daily.    . OXYCODONE-ACETAMINOPHEN 7.5-325 MG PO TABS Oral Take 1 tablet by mouth every 4 (four) hours as needed.    Marland Kitchen RABEPRAZOLE SODIUM 20 MG PO TBEC Oral Take 20 mg by mouth daily.    Marland Kitchen ROSUVASTATIN CALCIUM 10 MG PO TABS Oral Take 10 mg  by mouth daily.      Allergies: Allergies as of 06/30/2012 - Review Complete 06/30/2012  Allergen Reaction Noted  . Penicillins  07/30/2008   Past Medical History  Diagnosis Date  . GERD (gastroesophageal reflux disease)    Past Surgical History  Procedure Date  . Abdominal hysterectomy   . Appendectomy    No family history on file. History   Social History  . Marital Status: Married    Spouse Name: N/A    Number of Children: N/A  . Years of Education: N/A   Occupational History  . Not on file.   Social History Main Topics  . Smoking status: Current Every Day Smoker -- 0.3 packs/day for 60 years    Types: Cigarettes  . Smokeless tobacco: Not on file  . Alcohol Use: Yes  . Drug Use: No  . Sexually Active: Not on file   Other Topics Concern  . 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     Review of Systems: Constitutional: negative for anorexia, chills, fatigue, fevers, malaise, night sweats, sweats and weight loss Eyes: positive for contacts/glasses and visual disturbance, she has been using glasses since 66 years of age Ears, nose, mouth, throat, and face: negative for ear drainage, earaches, epistaxis, hearing loss, hoarseness, nasal congestion, sore mouth, sore throat, tinnitus and voice change Respiratory: positive for pleurisy/chest pain and she describes her chest pain as pressure and mainly located on the left side Cardiovascular: positive for chest pressure/discomfort and left sided, negative for claudication, dyspnea, exertional chest pressure/discomfort, irregular heart beat, near-syncope, orthopnea, palpitations, paroxysmal nocturnal dyspnea, syncope and tachypnea Gastrointestinal: negative Genitourinary:negative Integument/breast: negative Hematologic/lymphatic: negative for bleeding and easy bruising Musculoskeletal:positive for tingling and reduced sensation on the left side of her body from face to the toe., negative for  arthralgias, bone pain, muscle weakness, myalgias, neck pain and stiff joints Neurological: negative for seizures, speech problems, tremors, vertigo, weakness and she descibes some lightheadedness Behavioral/Psych: negative for bad mood, behavior problems, decreased appetite, depression, fatigue, good mood, loss of interest in favorite activities and sexual difficulty Endocrine: negative for temperature intolerance Allergic/Immunologic: negative  Physical Exam: Blood pressure 120/62, pulse 60, temperature 97.8 F (36.6 C), temperature source Oral, resp. rate 16, height 5\' 4"  (1.626 m), weight 148 lb (67.132 kg), SpO2 98.00%. General appearance: alert, cooperative, no distress and she is pleasant through out the interview Head: Normocephalic, without obvious abnormality, atraumatic Eyes: conjunctivae/corneas clear. PERRL, EOM's intact. Fundi benign. Throat: lips, mucosa, and tongue normal; teeth and gums normal Neck: no carotid bruit, no JVD and thyroid not enlarged, symmetric, no tenderness/mass/nodules Back: negative, symmetric, no curvature. ROM normal. No CVA tenderness. Lungs: clear to auscultation bilaterally Abdomen: soft, non-tender; bowel sounds normal; no masses,  no organomegaly Extremities: extremities normal, atraumatic,  no cyanosis or edema Pulses: 2+ and symmetric Skin: Skin color, texture, turgor normal. No rashes or lesions Neurologic: Alert and oriented X 3, normal strength and tone. Normal symmetric reflexes. Normal coordination and gait Sensory: she reports reduced sensation on the left from face to toes. She says that it feels less.  Motor: grossly normal Reflexes: 2+ and symmetric Coordination: Normal on both sides. Gait: Not tested  Lab results: Basic Metabolic Panel:  [facility] 06/30/12 1412  NA 137  K 4.0  CL 103  CO2 23  GLUCOSE 100*  BUN 9  CREATININE 0.56  CALCIUM 9.2  MG --  PHOS --   CBC:  Basename 06/30/12 1412  WBC 6.5  NEUTROABS --  HGB  12.8  HCT 37.8  MCV 97.2  PLT 277   Cardiac Enzymes:  Basename 06/30/12 1713  CKTOTAL --  CKMB --  CKMBINDEX --  TROPONINI <0.30     Imaging results:  Dg Chest 2 View  06/30/2012  *RADIOLOGY REPORT*  Clinical Data: Chest pressure, left side body numbness and tingling, smoker  CHEST - 2 VIEW  Comparison: None Correlation:  CT chest 01/10/2004  Findings: Enlargement of cardiac silhouette. Calcified aorta. Mediastinal contours and pulmonary vascularity normal. Minimal bronchitic changes and diffuse interstitial prominence. Faint curvilinear density is seen in the right upper lobe, suspect EKG lead. No definite pulmonary infiltrate, pleural effusion or pneumothorax. Levoconvex thoracic scoliosis. Osseous demineralization.  IMPRESSION: Enlargement of cardiac silhouette. Minimal bronchitic and interstitial changes of uncertain acuity. No definite focal infiltrate identified.   Original Report Authenticated By: Lollie Marrow, M.D.    Ct Head Wo Contrast  06/30/2012  *RADIOLOGY REPORT*  Clinical Data: 66 year old female numbness and tingling on the left side the body.  CT HEAD WITHOUT CONTRAST  Technique:  Contiguous axial images were obtained from the base of the skull through the vertex without contrast.  Comparison: 04/23/2007.  Findings: Visualized paranasal sinuses and mastoids are clear. Visualized orbit soft tissues are within normal limits.  Visualized scalp soft tissues are within normal limits.  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 No acute osseous abnormality identified.  Cerebral volume is stable and within normal limits for age.  No ventriculomegaly.  Mild Calcified atherosclerosis at the skull base.  No suspicious intracranial vascular hyperdensity. No evidence of cortically based acute infarction identified.  No midline shift, mass effect, or evidence of mass lesion.  No acute intracranial hemorrhage identified.  IMPRESSION: Stable and normal for age noncontrast CT appearance the  brain.   Original Report Authenticated By: Harley Hallmark, M.D.     Other results: EKG: there are no previous tracings available for comparison, nonspecific ST and T waves changes, sinus bradycardia, ST depression in V3, V4, V5, I, II, and aVF, together with IVCD  Assessment & Plan by Problem: 66 year old woman, with past medical history of hyper-lipidemia, who presents with left-sided tingling, and chest pressure.  Stroke: Ms Herbin's presentation with a history of tingling involving her left lower and upper extremity is concerning for a history of a stroke. Given this picture of pure hemianesthesia is suggestive of a lacunar stroke. Her risk factors include age, hyperlipidemia, and cigarette smoking. Physical examination is marked for on the reduced sensation on the left without associated weakness. A head CT scan was negative. Differential diagnoses include anxiety especially with history of associated chest pain. There is not enough evidence for seizures, syncope or migraine in this patient which make them very low in differential. She presented at least  18 hours since last seen normal and therefore did not qualify for any TPA. Plan - Aspirin 324mg  stat was given. To continue with Aspirin at 81mg  - Brain MRI will be obtained as this has a higher sensitity ischemic stroke compared to CT.  - MR angiongram Head to evaluate blood supply to the brain  - Other rest carotid Dopplers to assess for carotid stenosis - Echocardiogram for cardio-embolic stroke. However, she does not have a known arhythmia and cardiac exam is normal. - Lipitor. - Other tests:HbA1c, TSH, lipid profile - admit to telemetry for further monitoring. - Regular neuro checks - Swallowing evaluation - Close monitoring of her vitals.  Chest pain: The patient's description of her chest pain, being like, pressure or tightness in her chest, associated with some pain around the left side of her neck is very concerning for this being  unstable angina. Initial EKG showing nonspecific ST-T and T wave changes with sinus bradycardia, with depressions in multiple leads. There are no previous EKGs to compare with. In addition, TWI sometimes can occur in acute stroke. Troponins x1, were obtained and negative. Her risk factors for cardiac chest pain include cigarette smoking, and hyperlipidemia and age of 52. Chest x-ray was negative for any cardiopulmonary process. Other differential include GERD even though she says that her chest pain is different. Plan -Obtained cardiac enzymes. -Continue with cardiac monitor with EKG in AM - I have a low threshold for consulting cardiology for further evaluation if her chest pain continues.  - will contact her PCP for previous EKGs to compare  Hyperlipidemia: Patient does not have baseline lipid panel available here. She says, that she's been taking Crestor at 10 mg once daily for the last 6 months.  Plan. - Lipitor 80mg  once daily - Do lipid profile tomorrow   Signed: Dow Adolph 06/30/2012, 6:56 PM

## 2012-06-30 NOTE — ED Notes (Signed)
Pt to XR

## 2012-06-30 NOTE — ED Notes (Addendum)
Patient states she has tingling and numbness in the left side of her body since 11pm last night.  She denies weakness.  She denies headache.  Patient had a similar event 2 yrs ago.   She also reports onset of chest pressure/tightness for 2 hours

## 2012-06-30 NOTE — ED Notes (Signed)
Pt transported to MRI 

## 2012-06-30 NOTE — ED Notes (Signed)
Pt returned from MRI. Preparing pt for transport to floor.

## 2012-07-01 ENCOUNTER — Inpatient Hospital Stay (HOSPITAL_COMMUNITY): Payer: BC Managed Care – PPO

## 2012-07-01 DIAGNOSIS — E785 Hyperlipidemia, unspecified: Secondary | ICD-10-CM | POA: Diagnosis present

## 2012-07-01 DIAGNOSIS — R200 Anesthesia of skin: Secondary | ICD-10-CM | POA: Diagnosis present

## 2012-07-01 DIAGNOSIS — K219 Gastro-esophageal reflux disease without esophagitis: Secondary | ICD-10-CM

## 2012-07-01 DIAGNOSIS — R0789 Other chest pain: Secondary | ICD-10-CM

## 2012-07-01 DIAGNOSIS — G459 Transient cerebral ischemic attack, unspecified: Secondary | ICD-10-CM

## 2012-07-01 DIAGNOSIS — R209 Unspecified disturbances of skin sensation: Principal | ICD-10-CM

## 2012-07-01 DIAGNOSIS — R55 Syncope and collapse: Secondary | ICD-10-CM

## 2012-07-01 LAB — GLUCOSE, CAPILLARY
Glucose-Capillary: 164 mg/dL — ABNORMAL HIGH (ref 70–99)
Glucose-Capillary: 92 mg/dL (ref 70–99)
Glucose-Capillary: 133 mg/dL — ABNORMAL HIGH (ref 70–99)

## 2012-07-01 LAB — HEMOGLOBIN A1C
Hgb A1c MFr Bld: 5.6 % (ref <5.7)
Mean Plasma Glucose: 114 mg/dL (ref <117)

## 2012-07-01 LAB — TSH: TSH: 0.504 u[IU]/mL (ref 0.350–4.500)

## 2012-07-01 LAB — TROPONIN I: Troponin I: <0.3 ng/mL (ref <0.30)

## 2012-07-01 MED ORDER — ASPIRIN EC 325 MG PO TBEC: 325.0000 mg | DELAYED_RELEASE_TABLET | Freq: Every day | ORAL | Status: DC

## 2012-07-01 MED ORDER — ASPIRIN 81 MG PO TABS: 81.0000 mg | ORAL_TABLET | Freq: Every day | ORAL | Status: DC

## 2012-07-01 MED ORDER — ROSUVASTATIN CALCIUM 20 MG PO TABS: 20.0000 mg | ORAL_TABLET | Freq: Every day | ORAL | Status: DC

## 2012-07-01 MED ORDER — GADOBENATE DIMEGLUMINE 529 MG/ML IV SOLN
15.0000 mL | Freq: Once | INTRAVENOUS | Status: AC | PRN
  Administered 2012-07-01: 15 mL via INTRAVENOUS

## 2012-07-01 NOTE — Progress Notes (Signed)
Stroke Team Progress Note  HISTORY  Tonya [name] is an 66 y.o. female who noted left sided weakness/numbness of extremities around 2230 on 06/29/2012. She went to bed and awoke with the same. She then went to work and around 0800 today (06/30/2012) she noted that her symptoms were worse and that her face was now included. She did not have problems swallowing or speaking. She did not have difficulty with gait. She came home at lunch and asked her husband to bring her to the hospital. She did take a ASA 325mg  at bedtime last night and again this morning. She does not take aspirin usually.  She was admitted to the neuro floor for further evaluation and treatment.  LSN: [id number] on 06/29/2012  tPA Given: No: Out of window   SUBJECTIVE  Feels better. Feels like her strength is better and numbness is improved. She wants to go home.  OBJECTIVE Most recent Vital Signs: Filed Vitals:   06/30/12 1903 06/30/12 2024 07/01/12 0240 07/01/12 0619  BP: 148/73 119/69 97/61 108/61  Pulse: 63 99 59 61  Temp:  98.6 F (37 C) 98.3 F (36.8 C) 98.2 F (36.8 C)  TempSrc:  Oral Oral Oral  Resp: 15 18 20 18   Height:  5\' 4"  (1.626 m)    Weight:  69.8 kg (153 lb 14.1 oz)    SpO2: 100% 100% 100% 100%   CBG (last 3)   Basename 06/30/12 2212  GLUCAP 164*    IV Fluid Intake:     MEDICATIONS    . aspirin  324 mg Oral Once  . aspirin EC  81 mg Oral Daily  . atorvastatin  80 mg Oral q1800  . pantoprazole  40 mg Oral Q1200   PRN:  acetaminophen, nitroGLYCERIN, oxyCODONE-acetaminophen  Diet:  Cardiac thin liquids Activity:  Bathroom privileges with assist DVT Prophylaxis:  SCD  CLINICALLY SIGNIFICANT STUDIES Basic Metabolic Panel:  Lab 06/30/12 1610  NA 137  K 4.0  CL 103  CO2 23  GLUCOSE 100*  BUN 9  CREATININE 0.56  CALCIUM 9.2  MG --  PHOS --    CBC:  Lab 06/30/12 1412  WBC 6.5  NEUTROABS --  HGB 12.8  HCT 37.8  MCV 97.2  PLT 277   Cardiac Enzymes:  Lab 07/01/12 0630  06/30/12 2331 06/30/12 1713  CKTOTAL -- -- --  CKMB -- -- --  CKMBINDEX -- -- --  TROPONINI <0.30 <0.30 <0.30    Lipid Panel    Component Value Date/Time   CHOL 243* 06/30/2012 2058   TRIG 109 06/30/2012 2058   HDL 53 06/30/2012 2058   CHOLHDL 4.6 06/30/2012 2058   VLDL 22 06/30/2012 2058   LDLCALC 168* 06/30/2012 2058   HgbA1C  Lab Results  Component Value Date   HGBA1C 5.6 06/30/2012    Urine Drug Screen:     Component Value Date/Time   LABOPIA NONE DETECTED 06/30/2012 2218   COCAINSCRNUR NONE DETECTED 06/30/2012 2218   LABBENZ NONE DETECTED 06/30/2012 2218   AMPHETMU NONE DETECTED 06/30/2012 2218   THCU NONE DETECTED 06/30/2012 2218   LABBARB NONE DETECTED 06/30/2012 2218     Dg Chest 2 View 06/30/2012  Enlargement of cardiac silhouette. Minimal bronchitic and interstitial changes of uncertain acuity. No definite focal infiltrate identified.    Ct Head Wo Contrast 06/30/2012  Stable and normal for age noncontrast CT appearance the brain.   MRI BRAIN No acute infarct.   MRA HEAD Slightly motion degraded examination without large  vessel significant stenosis or occlusion.    2D Echocardiogram  EF 60%, systolic fxn normal. RA/LA normal size.  Carotid Doppler  No evidence of hemodynamically significant internal carotid artery stenosis. Vertebral artery flow is antegrade. Right vertebral demonstrates atypical flow with loss of diastolic component suggestive of distal obstruction.  CXR  Enlargement of cardiac silhouette. No definite focal infiltrate identified.  EKG  sinus bradycardia.   Therapy Recommendations PT - ; OT - ; ST -   Physical Exam   GENERAL EXAM: Patient is in no distress  CARDIOVASCULAR: Regular rate and rhythm, no murmurs, no carotid bruits  NEUROLOGIC: MENTAL STATUS: awake, alert, language fluent, comprehension intact, naming intact CRANIAL NERVE: pupils equal and reactive to light, visual fields full to confrontation, extraocular muscles  intact, no nystagmus, facial sensation and strength symmetric, uvula midline, shoulder shrug symmetric, tongue midline. MOTOR: normal bulk and tone, full strength in the BUE, BLE SENSORY: SUBTLE DECR SENS ON LET FACE, ARM, LEG COORDINATION: finger-nose-finger, fine finger movements normal REFLEXES: deep tendon reflexes present and symmetric GAIT/STATION: SITTING AT BEDSIDE.    ASSESSMENT [name] is a 66 y.o. female presenting with left sided weakness/numbness . Work up underway. On no anticoagulants prior to admission. Now on aspirin 325 mg orally every day for secondary stroke prevention. Patient with resultant left hemiparesis.   Left sided weakness/numbness (MRI negative stroke vs. TIA)  Hyperlipidemia, LDL 168  Long term medication use  HGB A1C  5.6   Abnormal carotid study showing right vertebral demonstrates atypical flow with loss of diastolic component suggestive of distal obstruction. MRA ordered.  Hospital day # 1  TREATMENT/PLAN  Continue aspirin 325 mg orally every day for secondary stroke prevention.  Risk Factor Modification  Goal LDL < 100, on lipitor (statin)--was on crestor before  Ambulate patient with therapy  Order MRA to review neck vessels. If this is stable, may be able to be discharged this afternoon.  Followup with primary MD in 2 weeks. (Dr. Alwyn Pea)  Guy Franco, [facility],  MBA, [facility] Pager: [phone] 07/01/2012 9:25 AM  [facility] - Stroke Team Joycelyn Schmid, MD 07/01/2012 11:00 AM  Please refer to [email or web address] for on-call Stroke MD

## 2012-07-01 NOTE — Evaluation (Signed)
Physical Therapy Evaluation Patient Details Name: Tonya [name] MRN: [id number] DOB: [birth year] Today's Date: 07/01/2012 Time: 4098-1191 PT Time Calculation (min): 7 min  PT Assessment / Plan / Recommendation Clinical Impression  Pt admitted with L sided numbness and weakness. Pt currently with no residual symptoms. Pt is at her baseline functional mobility, no acute PT needs, will not follow.    PT Assessment  Patent does not need any further PT services    Follow Up Recommendations  No PT follow up    Does the patient have the potential to tolerate intense rehabilitation      Barriers to Discharge        Equipment Recommendations  None recommended by PT    Recommendations for Other Services     Frequency      Precautions / Restrictions Precautions Precautions: None Restrictions Weight Bearing Restrictions: No   Pertinent Vitals/Pain None      Mobility  Bed Mobility Bed Mobility: Supine to Sit;Sitting - Scoot to Edge of Bed Supine to Sit: 7: Independent Sitting - Scoot to [place] of Bed: 7: Independent Transfers Transfers: Sit to Stand;Stand to Sit Sit to Stand: 7: Independent Stand to Sit: 7: Independent Ambulation/Gait Ambulation/Gait Assistance: 7: Independent Ambulation Distance (Feet): 50 Feet Assistive device: None Ambulation/Gait Assistance Details: Pt gait WFL. Pt standing on one leg, bending over to pick up objects, stepping over objects all without any loss of balance Gait Pattern: Within Functional Limits Gait velocity: normal gait speed Modified Rankin (Stroke Patients Only) Pre-Morbid Rankin Score: No symptoms Modified Rankin: No significant disability      Visit Information  Last PT Received On: 07/01/12 Assistance Needed: +1    Subjective Data  Patient Stated Goal: to go home as soon as possible   Prior Functioning  Home Living Lives With: Spouse Available Help at Discharge: Family;Available 24 hours/day (works during the  day) Type of Home: House Home Access: Stairs to enter [employer] of Steps: 4 Entrance Stairs-Rails: Left Home Layout: One level Bathroom Shower/Tub: Tub/shower unit;Door [employer] Toilet: Handicapped height Bathroom Accessibility: Yes How Accessible: Accessible via walker Home Adaptive Equipment: None Prior Function Level of Independence: Independent Able to Take Stairs?: Yes Driving: Yes Vocation: Full time employment Comments: Production designer, 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 Communication Communication: No difficulties Dominant Hand: Right    Cognition  Overall Cognitive Status: Appears within functional limits for tasks assessed/performed Arousal/Alertness: Awake/alert Orientation Level: Appears intact for tasks assessed Behavior During Session: [facility] for tasks performed    Extremity/Trunk Assessment Right Lower Extremity Assessment RLE ROM/Strength/Tone: Within functional levels RLE Sensation: WFL - Light Touch RLE Coordination: WFL - gross/fine motor Left Lower Extremity Assessment LLE ROM/Strength/Tone: Within functional levels LLE Sensation: WFL - Light Touch LLE Coordination: WFL - gross/fine motor   Balance Balance Balance Assessed:  (see above gait)  End of Session PT - End of Session Equipment Utilized During Treatment: Gait belt Activity Tolerance: Patient tolerated treatment well Patient left: Other (comment) (with transport and RN in [facility]) Nurse Communication: Mobility status    Milana Kidney 07/01/2012, 6:00 PM

## 2012-07-01 NOTE — H&P (Signed)
Internal Medicine Attending Admission Note Date: 07/01/2012  Patient name: Tonya [name] Medical record number: [id number] Date of birth: 05/31/1946 Age: 66 y.o. Gender: female  I saw and evaluated the patient. I reviewed the resident's note and I agree with the resident's findings and plan as documented in the resident's note.  Chief Complaint(s): Left-sided weakness  History - key components related to admission: Patient is a relatively healthy 66 year old woman with past medical history significant for hyperlipidemia, current smoker and reflux disease who presented with tingling and numbness in her entire left side from the face to feet. She also noticed tingling in her tongue and difficulty swallowing. Symptoms started the night prior to admission but she ignored the symptoms and went to bed. Patient was able to sleep well but noticed that she continue to experience the same symptoms when she woke up in the morning. She went on to walk but when symptoms continued without any improvement She decided to come to ER.  Patient has never had such symptoms in the past. Review of systems is also positive for chest discomfort which she describes as pressure and tightness. Patient is otherwise fairly healthy and can walk without getting short of breath or having chest pain.  She denies palpitations, nausea, vomiting, shortness of breath, orthopnea, PND, abdominal pain, lower extremity edema, change in her sleeping pattern, change in bowel movements or urinary habits.  The patient's symptoms have completely resolved at this time.    Physical Exam - key components related to admission:  Filed Vitals:   07/01/12 0240 07/01/12 0619 07/01/12 1010 07/01/12 1418  BP: 97/61 108/61 109/60 131/73  Pulse: 59 61 61 63  Temp: 98.3 F (36.8 C) 98.2 F (36.8 C) 98.3 F (36.8 C) 97.8 F (36.6 C)  TempSrc: Oral Oral Oral Oral  Resp: 20 18 18 18   Height:      Weight:      SpO2: 100% 100% 100% 100%   very pleasant and cooperative to examination. Alert oriented x3 Cardiovascular: S1-S2 normal, no murmurs rubs or gallops, Respiratory: Clear to auscultation bilaterally Abdomen: Soft nontender nondistended and no organomegaly Extremities: No edema noted Neurological exam: Nonfocal, normal strength and tone, cranial nerves II through XII intact, normal coordination, normal sensations throughout, deep tendon reflexes normal and symmetric  Lab results:   Basic Metabolic Panel:  Basename 06/30/12 1412  NA 137  K 4.0  CL 103  CO2 23  GLUCOSE 100*  BUN 9  CREATININE 0.56  CALCIUM 9.2  MG --  PHOS --   CBC:  Basename 06/30/12 1412  WBC 6.5  NEUTROABS --  HGB 12.8  HCT 37.8  MCV 97.2  PLT 277   Cardiac Enzymes:  Basename 07/01/12 0630 06/30/12 2331 06/30/12 1713  CKTOTAL -- -- --  CKMB -- -- --  CKMBINDEX -- -- --  TROPONINI <0.30 <0.30 <0.30   CBG:  Basename 07/01/12 1203 07/01/12 0617 06/30/12 2212  GLUCAP 134* 92 164*   Hemoglobin A1C:  Basename 06/30/12 2058  HGBA1C 5.6   Fasting Lipid Panel:  Basename 06/30/12 2058  CHOL 243*  HDL 53  LDLCALC 168*  TRIG 109  CHOLHDL 4.6  LDLDIRECT --   Thyroid Function Tests:  Basename 06/30/12 2058  TSH 0.504  T4TOTAL --  FREET4 --  T3FREE --  THYROIDAB --     Imaging results:  Dg Chest 2 View  06/30/2012   IMPRESSION: Enlargement of cardiac silhouette. Minimal bronchitic and interstitial changes of uncertain acuity. No definite focal  infiltrate identified.   Original Report Authenticated By: Lollie Marrow, M.D.    Ct Head Wo Contrast  06/30/2012 IMPRESSION: Stable and normal for age noncontrast CT appearance the brain.   Original Report Authenticated By: Harley Hallmark, M.D.    Mr Angiogram Head Wo Contrast  07/01/2012  IMPRESSION: Slightly motion degraded examination without large vessel significant stenosis or occlusion.   Original Report Authenticated By: Fuller Canada, M.D.    Mr Brain Wo  Contrast  07/01/2012 IMPRESSION: Slightly motion degraded examination without large vessel significant stenosis or occlusion.   Original Report Authenticated By: Fuller Canada, M.D.     Other results: EKG: Normal sinus rhythm, T-wave inversions present diffusely throughout precordial leads and inferior leads, sinus bradycardia noted, no previous EKG at this time to compare.  Assessment & Plan by Problem:  Principal Problem:  *TIA (transient ischemic attack) Active Problems:  Chest pressure  GERD (gastroesophageal reflux disease)  Cerebral embolism with cerebral infarction  Hemiplegia, unspecified, affecting nondominant side  Patient does have several risk factors which needs to be modified for secondary prevention of stroke. LDL was found to be 168 and patient is a current smoker. Patient was also not on aspirin for primary prevention of stroke. Patient needs secondary stroke prevention with aspirin, statins, smoking cessation and blood pressure reduction. I discussed in detail the benefits of each and every measure noted above. Patient promised to stop smoking at this time. She will followup with her primary care physician and neurology. Patient is ready to discharged at this time with no imaging abnormalities found on MRI and MRA. Patient also had carotid Dopplers and 2-D echocardiogram, the full results of which are pending at this time. According to neurology patient can be safely discharged home pending the full results of above tests.  Lars Mage MD Pager [phone]

## 2012-07-01 NOTE — Progress Notes (Signed)
*  PRELIMINARY RESULTS* Vascular Ultrasound Carotid Duplex (Doppler) has been completed.  Preliminary findings: Bilateral:  No evidence of hemodynamically significant internal carotid artery stenosis.   Vertebral artery flow is antegrade.  Right vertebral demonstrates atypical flow with loss of diastolic component suggestive of distal obstruction.    Farrel Demark, RDMS, RVT 07/01/2012, 10:38 AM

## 2012-07-01 NOTE — Discharge Summary (Signed)
Internal Medicine Teaching [facility] Discharge Note  Name: Tonya [name] MRN: [id number] DOB: Sep 04, 1946 66 y.o.  Date of Admission: 06/30/2012  2:10 PM Date of Discharge: 07/01/2012 Attending Physician: Lars Mage, MD  Discharge Diagnosis: Active Problems:  Numbness and tingling of left arm and leg  Chest pressure  Hyperlipidemia LDL goal < 100  GERD (gastroesophageal reflux disease)   Discharge Medications:   Medication List     As of 07/01/2012  6:02 PM    TAKE these medications         aspirin 81 MG tablet   Take 1 tablet (81 mg total) by mouth daily.      CALCIUM + D PO   Take 1 tablet by mouth daily.      multivitamin with minerals Tabs   Take 1 tablet by mouth daily.      oxyCODONE-acetaminophen 7.5-325 MG per tablet   Commonly known as: PERCOCET   Take 1 tablet by mouth every 4 (four) hours as needed.      RABEprazole 20 MG tablet   Commonly known as: ACIPHEX   Take 20 mg by mouth daily.      rosuvastatin 20 MG tablet   Commonly known as: CRESTOR   Take 1 tablet (20 mg total) by mouth daily.          Disposition and follow-up:   Tonya [name] was discharged from [facility] in stable condition.  At the hospital follow up visit please address   1. Please address the patient's risk factors for stroke, including cigarette smoking and hyperlipidemia. She is willing to quit smoking however, she was not ready to start using nicotine patches. She indicated that she would be interested in trying them in the future. 2. Please note, the patient was started on aspirin for secondary prevention , and her rosuvastatin was increased to 20 mg once daily. 3. Please evaluate this patient for cardiac disease, specifically, comparing her EKG with any previous ones that you might have in your office. The one that was performed in the ED during this admission indicated unspecific T-wave inversions, and ST segment depressions together with  sinus bradycardia. Please refer to the chest pain section in the hospital course note for further details.  Follow-up Appointments: Follow-up Information    Follow up with [place], MD. Call on 07/04/2012. (Please call the office on Monday October 28th, 2013)    Contact information:   [address] [place] [address] [phone]         Discharge Orders    Future Orders Please Complete By Expires   Diet - low sodium heart healthy      Diet - low sodium heart healthy      Increase activity slowly      Call MD for:  temperature >100.4      Call MD for:  persistant nausea and vomiting      Call MD for:  severe uncontrolled pain      Call MD for:  redness, tenderness, or signs of infection (pain, swelling, redness, odor or green/yellow discharge around incision site)      Call MD for:  persistant dizziness or light-headedness      Call MD for:  difficulty breathing, headache or visual disturbances      Increase activity slowly      Discharge instructions      Comments:   Please call Dr Shelbie Proctor office for an appointment as soon as possible  Please start taking Aspirin at 81mg  once daily Please continue with Crestor at 20mg  once daily   Call MD for:  temperature >100.4      Call MD for:  persistant dizziness or light-headedness         Consultations: Treatment Team:  Md Stroke, MD  Procedures Performed:  Dg Chest 2 View  06/30/2012  *RADIOLOGY REPORT*  Clinical Data: Chest pressure, left side body numbness and tingling, smoker  CHEST - 2 VIEW  Comparison: None Correlation:  CT chest 01/10/2004  Findings: Enlargement of cardiac silhouette. Calcified aorta. Mediastinal contours and pulmonary vascularity normal. Minimal bronchitic changes and diffuse interstitial prominence. Faint curvilinear density is seen in the right upper lobe, suspect EKG lead. No definite pulmonary infiltrate, pleural effusion or pneumothorax. Levoconvex thoracic scoliosis. Osseous  demineralization.  IMPRESSION: Enlargement of cardiac silhouette. Minimal bronchitic and interstitial changes of uncertain acuity. No definite focal infiltrate identified.   Original Report Authenticated By: Lollie Marrow, M.D.    Ct Head Wo Contrast  06/30/2012  *RADIOLOGY REPORT*  Clinical Data: 66 year old female numbness and tingling on the left side the body.  CT HEAD WITHOUT CONTRAST  Technique:  Contiguous axial images were obtained from the base of the skull through the vertex without contrast.  Comparison: 04/23/2007.  Findings: Visualized paranasal sinuses and mastoids are clear. Visualized orbit soft tissues are within normal limits.  Visualized scalp soft tissues are within normal limits.  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 No acute osseous abnormality identified.  Cerebral volume is stable and within normal limits for age.  No ventriculomegaly.  Mild Calcified atherosclerosis at the skull base.  No suspicious intracranial vascular hyperdensity. No evidence of cortically based acute infarction identified.  No midline shift, mass effect, or evidence of mass lesion.  No acute intracranial hemorrhage identified.  IMPRESSION: Stable and normal for age noncontrast CT appearance the brain.   Original Report Authenticated By: Harley Hallmark, M.D.    Mr Angiogram Head Wo Contrast  07/01/2012  *RADIOLOGY REPORT*  Clinical Data:  Left sided numbness.  Symptoms worsening.  MRI BRAIN WITHOUT CONTRAST MRA HEAD WITHOUT CONTRAST  Technique: Multiplanar, multiecho pulse sequences of the brain and surrounding structures were obtained according to standard protocol without intravenous contrast.  Angiographic images of the head were obtained using MRA technique without contrast.  Comparison: 06/30/2012 CT.  No comparison MR.  MRI HEAD  Findings:  No acute infarct.  Cerebellar tonsils minimally low-lying at the upper limits of normal.  No intracranial hemorrhage.  No hydrocephalus.  No intracranial mass  lesion detected on this unenhanced exam.  Partial opacification right mastoid air cells.  No obstructing lesion noted in the region of the posterior-superior nasopharynx causing eustachian tube dysfunction.  Mild exophthalmos.  Major intracranial vascular structures are patent.  IMPRESSION: No acute infarct.  Partial opacification right mastoid air cells.  Please see above.  MRA HEAD  Findings: Motion degraded exam.  Ectatic internal carotid arteries.  Anterior circulation without medium or large size vessel significant stenosis or occlusion.  Irregularity of medium and small size vessels may be related to motion rather than atherosclerotic type changes.  Ectatic vertebral arteries and basilar artery.  Mild narrowing distal right vertebral arteries.  Nonvisualization right AICA.  Fetal type origin of the right posterior cerebral artery.  No obvious aneurysm or vascular malformation.  Evaluation limited by motion.  IMPRESSION: Slightly motion degraded examination without large vessel significant stenosis or occlusion.   Original Report Authenticated By: Almedia Balls  Constance Goltz, M.D.    Mr Brain Wo Contrast  07/01/2012  *RADIOLOGY REPORT*  Clinical Data:  Left sided numbness.  Symptoms worsening.  MRI BRAIN WITHOUT CONTRAST MRA HEAD WITHOUT CONTRAST  Technique: Multiplanar, multiecho pulse sequences of the brain and surrounding structures were obtained according to standard protocol without intravenous contrast.  Angiographic images of the head were obtained using MRA technique without contrast.  Comparison: 06/30/2012 CT.  No comparison MR.  MRI HEAD  Findings:  No acute infarct.  Cerebellar tonsils minimally low-lying at the upper limits of normal.  No intracranial hemorrhage.  No hydrocephalus.  No intracranial mass lesion detected on this unenhanced exam.  Partial opacification right mastoid air cells.  No obstructing lesion noted in the region of the posterior-superior nasopharynx causing eustachian tube dysfunction.   Mild exophthalmos.  Major intracranial vascular structures are patent.  IMPRESSION: No acute infarct.  Partial opacification right mastoid air cells.  Please see above.  MRA HEAD  Findings: Motion degraded exam.  Ectatic internal carotid arteries.  Anterior circulation without medium or large size vessel significant stenosis or occlusion.  Irregularity of medium and small size vessels may be related to motion rather than atherosclerotic type changes.  Ectatic vertebral arteries and basilar artery.  Mild narrowing distal right vertebral arteries.  Nonvisualization right AICA.  Fetal type origin of the right posterior cerebral artery.  No obvious aneurysm or vascular malformation.  Evaluation limited by motion.  IMPRESSION: Slightly motion degraded examination without large vessel significant stenosis or occlusion.   Original Report Authenticated By: Fuller Canada, M.D.     2D Echo: Left ventricle: The cavity size was normal. Wall thickness was normal. Systolic function was normal. The estimated ejection fraction was in the range of 60% to 65%.   Admission HPI: Tonya. Tonya [name] is a 66 year old woman, with past medical history only significant for hyperlipidemia, and GERD , who presented with left-sided tingling and numbness. Symptoms started the night prior to admission but she ignored the symptoms and went to bed. Patient was able to sleep well but noticed that she continue to experience the same symptoms when she woke up in the morning. She went on to walk but when symptoms continued without any improvement She decided to come to ER. She denied history of weakness, imbalance, dizziness, visual changes, or hearing deficits. She denied history of falls. She does not report any history of slurred speech or difficulty finding words. She denied previous history of similar symptoms. She denies any symptoms on the right side - ' my body feels like is split into two; with the right side completely normal' Her only  medications include Crestor started 6 months ago, and antiacids. She also complained of chest discomfort, which she described as pressure and tightness, that is mainly located in the central chest and to the left side of her chest as well. The chest pain started in the morning and it progressively got worse. It did not improve with aspirin she took at home. Her pain did not radiate to the left shoulder or jaw. However, she had some area of burning sensation on the left side of her neck which started after she had taken Nitroglycerin in the ED. The Nitroglycerin improved her chest pain. She denied history of palpitations, nausea, vomiting, or shortness of breath. She denied history of orthopnea, paroxysmal dyspnea or edema. This was the first episode of such chest tightness in her life. She smokes about 6-7 cigarettes per day since she was a teen. She  denies history of recreational drug use. She drinks alcohol only about 3 times in a year. She was not taking aspirin at home.   Hospital Course by problem list:  Left-sided numbness and tingling: Tonya [name]'s presentation with a history of tingling and numbness involving her left lower and upper extremities was concerning for a history of a stroke, specifically, a lacunar stroke with this picture of pure hemianesthesia. Her risk factors for stroke include age, hyperlipidemia, and cigarette smoking. Physical examination was marked for educed sensation on the left without associated weakness. However, a head CT scan, brain, MRI, and MR Angiogram, are all negative. Differential diagnoses considered included anxiety especially with history of associated chest pain accompanying the numbness. There was not enough evidence for seizures, syncope or migraine in this patient which make them very low in differential. Echocardiogram showed an ejection fraction 65%. There is no evidence of cardiac thrombus. However, carotid Dopplers indicated that the right vertebral demonstrated  atypical flow with loss of diastolic component suggestive of distal obstruction. After one day admission, she reported that the symptoms have completely resolved.  She was discharged on Aspirin at 81mg  for secondary prevention. The results of MRA 2 view neck vessels was recommended by neurology but results were pending at the time of discharge. She was advised to followup with her primary care physician in 2 weeks.  Chest pain: Tonya. Tonya [name] on admission described experiencing chest tight and pressure which was mainly on the left side. It had lasted about 3-4 hours before she presented to the ED. She also reported a sensation of heat on the left side of her neck. This was initially very concerning for this being unstable angina. Initial EKG showing nonspecific ST-T and T wave changes with sinus bradycardia, with depressions in multiple leads. There are no previous EKGs to compare with. In addition, TWI sometimes can occur in acute stroke. Troponins x3, were obtained and negative. Her risk factors for cardiac chest pain include cigarette smoking, and hyperlipidemia and age of 4. Chest x-ray was negative for any cardiopulmonary process. Other differential include GERD even though she says that her chest pain is different. Overnight cardiac monitoring was uneventful. Today, the patient does not report any chest pain, and she says that in fact, she last had chest pain before taking the nitroglycerin. At this time, the possibility of this chest pain being angina is minimal. This is most likely a typical chest pains, which might be resulting from GERD. The patient will be evaluated by her primary care physician in 2 weeks to address her risk factors for coronary artery disease including smoking and hyperlipidemia.  Hyperlipidemia: Patient does not have baseline lipid panel available here. She says, that she's been taking Crestor at 10 mg once daily for the last 6 months. The dose was increased to 20mg  once daily with a  LDL goal of 100 Smoking Ceasation: She was counseled about about smoking cessation. She understands the complications of cigarette smoking. However, she was not ready to start on any nicotine patches to help her quit. This will be addressed by her primary care physician as outpatient.      Discharge Vitals:  BP 131/73  Pulse 63  Temp 97.8 F (36.6 C) (Oral)  Resp 18  Ht 5\' 4"  (1.626 m)  Wt 153 lb 14.1 oz (69.8 kg)  BMI 26.41 kg/m2  SpO2 100%  Discharge Labs:  Results for orders placed during the hospital encounter of 06/30/12 (from the past 24 hour(s))  HEMOGLOBIN A1C  Status: Normal   Collection Time   06/30/12  8:58 PM      Component Value Range   Hemoglobin A1C 5.6  <5.7 %   Mean Plasma Glucose 114  <117 mg/dL  LIPID PANEL     Status: Abnormal   Collection Time   06/30/12  8:58 PM      Component Value Range   Cholesterol 243 (*) 0 - 200 mg/dL   Triglycerides 161  <096 mg/dL   HDL 53  >04 mg/dL   Total CHOL/HDL Ratio 4.6     VLDL 22  0 - 40 mg/dL   LDL Cholesterol 540 (*) 0 - 99 mg/dL  TSH     Status: Normal   Collection Time   06/30/12  8:58 PM      Component Value Range   TSH 0.504  0.350 - 4.500 uIU/mL  GLUCOSE, CAPILLARY     Status: Abnormal   Collection Time   06/30/12 10:12 PM      Component Value Range   Glucose-Capillary 164 (*) 70 - 99 mg/dL   Comment 1 Documented in Chart     Comment 2 Notify RN    URINE RAPID DRUG SCREEN (HOSP PERFORMED)     Status: Normal   Collection Time   06/30/12 10:18 PM      Component Value Range   Opiates NONE DETECTED  NONE DETECTED   Cocaine NONE DETECTED  NONE DETECTED   Benzodiazepines NONE DETECTED  NONE DETECTED   Amphetamines NONE DETECTED  NONE DETECTED   Tetrahydrocannabinol NONE DETECTED  NONE DETECTED   Barbiturates NONE DETECTED  NONE DETECTED  TROPONIN I     Status: Normal   Collection Time   06/30/12 11:31 PM      Component Value Range   Troponin I <0.30  <0.30 ng/mL  GLUCOSE, CAPILLARY     Status:  Normal   Collection Time   07/01/12  6:17 AM      Component Value Range   Glucose-Capillary 92  70 - 99 mg/dL   Comment 1 Documented in Chart     Comment 2 Notify RN    TROPONIN I     Status: Normal   Collection Time   07/01/12  6:30 AM      Component Value Range   Troponin I <0.30  <0.30 ng/mL  GLUCOSE, CAPILLARY     Status: Abnormal   Collection Time   07/01/12 12:03 PM      Component Value Range   Glucose-Capillary 134 (*) 70 - 99 mg/dL   Comment 1 Documented in Chart     Comment 2 Notify RN    GLUCOSE, CAPILLARY     Status: Abnormal   Collection Time   07/01/12  4:57 PM      Component Value Range   Glucose-Capillary 133 (*) 70 - 99 mg/dL   Comment 1 Documented in Chart     Comment 2 Notify RN      Signed: Dow Adolph 07/01/2012, 6:02 PM   Time Spent on Discharge: 

## 2012-07-01 NOTE — Progress Notes (Signed)
Nsg Discharge Note  Admit Date:  06/30/2012 Discharge date: 07/01/2012   Tonya [name] Herbin to be D/C'd Home per MD order.  AVS completed.  Copy for chart, and copy for patient signed, and dated. Patient/caregiver able to verbalize understanding.  Discharge Medication:  [name], [name]  Home Medication Instructions [id number]   Printed on:07/01/12 1900  Medication Information                    RABEprazole (ACIPHEX) 20 MG tablet Take 20 mg by mouth daily.           oxyCODONE-acetaminophen (PERCOCET) 7.5-325 MG per tablet Take 1 tablet by mouth every 4 (four) hours as needed.           Multiple Vitamin (MULTIVITAMIN WITH MINERALS) TABS Take 1 tablet by mouth daily.           Calcium Carbonate-Vitamin D (CALCIUM + D PO) Take 1 tablet by mouth daily.           rosuvastatin (CRESTOR) 20 MG tablet Take 1 tablet (20 mg total) by mouth daily.           aspirin 81 MG tablet Take 1 tablet (81 mg total) by mouth daily.             Discharge Assessment: Filed Vitals:   07/01/12 1418  BP: 131/73  Pulse: 63  Temp: 97.8 F (36.6 C)  Resp: 18   Skin clean, dry and intact without evidence of skin break down, no evidence of skin tears noted. IV catheter discontinued intact. Site without signs and symptoms of complications - no redness or edema noted at insertion site, patient denies c/o pain - only slight tenderness at site.  Dressing with slight pressure applied.  D/c Instructions-Education: Discharge instructions given to patient/family with verbalized understanding. D/c education completed with patient/family including follow up instructions, medication list, d/c activities limitations if indicated, with other d/c instructions as indicated by MD - patient able to verbalize understanding, all questions fully answered. Patient instructed to return to ED, call 911, or call MD for any changes in condition.  Patient escorted via WC, and D/C home via private auto.  [name],  [name] [name], RN 07/01/2012 7:00 PM

## 2012-07-01 NOTE — Progress Notes (Signed)
Subjective:    Interval Events:  The patient reports that her symptoms of greatly improved. She feels that her sensation on the left side is now about 90% normal. She does not have any new complaints. No overnight events.     Objective:    Vital Signs:   Temp:  [97.8 F (36.6 C)-98.6 F (37 C)] 97.8 F (36.6 C) (10/25 1418) Pulse Rate:  [59-99] 63  (10/25 1418) Resp:  [15-20] 18  (10/25 1418) BP: (97-148)/(60-73) 131/73 mmHg (10/25 1418) SpO2:  [98 %-100 %] 100 % (10/25 1418) Weight:  [153 lb 14.1 oz (69.8 kg)] 153 lb 14.1 oz (69.8 kg) (10/24 2024) Last BM Date: 06/29/12   Physical Exam: General appearance: alert, cooperative, no distress and she is pleasant through out the interview  Head: Normocephalic, without obvious abnormality, atraumatic  Eyes: conjunctivae/corneas clear. PERRL, EOM's intact. Fundi benign.  Throat: lips, mucosa, and tongue normal; teeth and gums normal  Neck: no carotid bruit, no JVD and thyroid not enlarged, symmetric, no tenderness/mass/nodules  Back: negative, symmetric, no curvature. ROM normal. No CVA tenderness.  Lungs: clear to auscultation bilaterally  Abdomen: soft, non-tender; bowel sounds normal; no masses, no organomegaly  Extremities: extremities normal, atraumatic, no cyanosis or edema  Pulses: 2+ and symmetric  Skin: Skin color, texture, turgor normal. No rashes or lesions  Neurologic: Alert and oriented X 3, normal strength and tone. Normal symmetric reflexes. Normal coordination and gait  Sensory: she reports reduced sensation on the left from face to toes. She says that it feels less.  Motor: grossly normal  Reflexes: 2+ and symmetric  Coordination: Normal on both sides.   Labs: Basic Metabolic Panel:  Lab 06/30/12 1610  NA 137  K 4.0  CL 103  CO2 23  GLUCOSE 100*  BUN 9  CREATININE 0.56  CALCIUM 9.2  MG --  PHOS --   CBC:  Lab 06/30/12 1412  WBC 6.5  NEUTROABS --  HGB 12.8  HCT 37.8  MCV 97.2  PLT 277     Cardiac Enzymes:  Lab 07/01/12 0630 06/30/12 2331 06/30/12 1713  CKTOTAL -- -- --  CKMB -- -- --  CKMBINDEX -- -- --  TROPONINI <0.30 <0.30 <0.30     CBG:  Lab 07/01/12 1203 07/01/12 0617 06/30/12 2212  GLUCAP 134* 92 164*     Other results: EKG Results: Unchanged from yesterday with nonspecific ST and T waves changes, sinus bradycardia, ST depression in V3, V4, V5, I, II, and aVF, together with IVCD  Imaging: Dg Chest 2 View  06/30/2012  *RADIOLOGY REPORT*  Clinical Data: Chest pressure, left side body numbness and tingling, smoker  CHEST - 2 VIEW  Comparison: None Correlation:  CT chest 01/10/2004  Findings: Enlargement of cardiac silhouette. Calcified aorta. Mediastinal contours and pulmonary vascularity normal. Minimal bronchitic changes and diffuse interstitial prominence. Faint curvilinear density is seen in the right upper lobe, suspect EKG lead. No definite pulmonary infiltrate, pleural effusion or pneumothorax. Levoconvex thoracic scoliosis. Osseous demineralization.  IMPRESSION: Enlargement of cardiac silhouette. Minimal bronchitic and interstitial changes of uncertain acuity. No definite focal infiltrate identified.   Original Report Authenticated By: Lollie Marrow, M.D.    Ct Head Wo Contrast  06/30/2012  *RADIOLOGY REPORT*  Clinical Data: 66 year old female numbness and tingling on the left side the body.  CT HEAD WITHOUT CONTRAST  Technique:  Contiguous axial images were obtained from the base of the skull through the vertex without contrast.  Comparison: 04/23/2007.  Findings: Visualized  paranasal sinuses and mastoids are clear. Visualized orbit soft tissues are within normal limits.  Visualized scalp soft tissues are within normal limits.  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 No acute osseous abnormality identified.  Cerebral volume is stable and within normal limits for age.  No ventriculomegaly.  Mild Calcified atherosclerosis at the skull base.  No  suspicious intracranial vascular hyperdensity. No evidence of cortically based acute infarction identified.  No midline shift, mass effect, or evidence of mass lesion.  No acute intracranial hemorrhage identified.  IMPRESSION: Stable and normal for age noncontrast CT appearance the brain.   Original Report Authenticated By: Harley Hallmark, M.D.    Mr Angiogram Head Wo Contrast  07/01/2012  *RADIOLOGY REPORT*  Clinical Data:  Left sided numbness.  Symptoms worsening.  MRI BRAIN WITHOUT CONTRAST MRA HEAD WITHOUT CONTRAST  Technique: Multiplanar, multiecho pulse sequences of the brain and surrounding structures were obtained according to standard protocol without intravenous contrast.  Angiographic images of the head were obtained using MRA technique without contrast.  Comparison: 06/30/2012 CT.  No comparison MR.  MRI HEAD  Findings:  No acute infarct.  Cerebellar tonsils minimally low-lying at the upper limits of normal.  No intracranial hemorrhage.  No hydrocephalus.  No intracranial mass lesion detected on this unenhanced exam.  Partial opacification right mastoid air cells.  No obstructing lesion noted in the region of the posterior-superior nasopharynx causing eustachian tube dysfunction.  Mild exophthalmos.  Major intracranial vascular structures are patent.  IMPRESSION: No acute infarct.  Partial opacification right mastoid air cells.  Please see above.  MRA HEAD  Findings: Motion degraded exam.  Ectatic internal carotid arteries.  Anterior circulation without medium or large size vessel significant stenosis or occlusion.  Irregularity of medium and small size vessels may be related to motion rather than atherosclerotic type changes.  Ectatic vertebral arteries and basilar artery.  Mild narrowing distal right vertebral arteries.  Nonvisualization right AICA.  Fetal type origin of the right posterior cerebral artery.  No obvious aneurysm or vascular malformation.  Evaluation limited by motion.  IMPRESSION:  Slightly motion degraded examination without large vessel significant stenosis or occlusion.   Original Report Authenticated By: Fuller Canada, M.D.    Mr Brain Wo Contrast  07/01/2012  *RADIOLOGY REPORT*  Clinical Data:  Left sided numbness.  Symptoms worsening.  MRI BRAIN WITHOUT CONTRAST MRA HEAD WITHOUT CONTRAST  Technique: Multiplanar, multiecho pulse sequences of the brain and surrounding structures were obtained according to standard protocol without intravenous contrast.  Angiographic images of the head were obtained using MRA technique without contrast.  Comparison: 06/30/2012 CT.  No comparison MR.  MRI HEAD  Findings:  No acute infarct.  Cerebellar tonsils minimally low-lying at the upper limits of normal.  No intracranial hemorrhage.  No hydrocephalus.  No intracranial mass lesion detected on this unenhanced exam.  Partial opacification right mastoid air cells.  No obstructing lesion noted in the region of the posterior-superior nasopharynx causing eustachian tube dysfunction.  Mild exophthalmos.  Major intracranial vascular structures are patent.  IMPRESSION: No acute infarct.  Partial opacification right mastoid air cells.  Please see above.  MRA HEAD  Findings: Motion degraded exam.  Ectatic internal carotid arteries.  Anterior circulation without medium or large size vessel significant stenosis or occlusion.  Irregularity of medium and small size vessels may be related to motion rather than atherosclerotic type changes.  Ectatic vertebral arteries and basilar artery.  Mild narrowing distal right vertebral arteries.  Nonvisualization right  AICA.  Fetal type origin of the right posterior cerebral artery.  No obvious aneurysm or vascular malformation.  Evaluation limited by motion.  IMPRESSION: Slightly motion degraded examination without large vessel significant stenosis or occlusion.   Original Report Authenticated By: Fuller Canada, M.D.       Medications:    Infusions:      Scheduled Medications:    . aspirin EC  325 mg Oral Daily  . atorvastatin  80 mg Oral q1800  . pantoprazole  40 mg Oral Q1200  . DISCONTD: aspirin  324 mg Oral Once  . DISCONTD: aspirin EC  81 mg Oral Daily     PRN Medications: acetaminophen, nitroGLYCERIN, oxyCODONE-acetaminophen   Assessment/ Plan:   66 year old woman, with past medical history of hyper-lipidemia, who presents with left-sided tingling, and chest pressure.   Left-sided numbness and tingling: Ms Herbin's presentation with a history of tingling and numbness involving her left lower and upper extremities. Her presentation was concerning for a history of a stroke, specifically, a lacunar stroke this picture of pure hemianesthesia. Her risk factors for stroke include age, hyperlipidemia, and cigarette smoking. Physical examination is marked for on the reduced sensation on the left without associated weakness. However, a head CT scan, brain, MRI, and MR Angiogram, are all negative. Differential diagnoses considered included anxiety especially with history of associated chest pain accompanying the numbness. There was not enough evidence for seizures, syncope or migraine in this patient which make them very low in differential. Echocardiogram has been performed and I ejection fraction 65%. There is no evidence of cardiac thrombus. However, carotid Dopplers indicated that the right vertebral demonstrated atypical flow with loss of diastolic component suggestive of distal obstruction. After one day admission, she reported that the symptoms have completely resolved.  Plan  - To continue with Aspirin at 81mg  for secondary prevention. - MRA 2 view neck vessels was recommended by neurology. - Patient will followup with her primary care physician in 2 weeks.  Chest pain: [name] on admission described experiencing chest tight and pressure which was mainly on the left side. It had lasted about 3-4 hours before she presented to the  ED. She also reported a sensation of heat on the left side of her neck. This was initially very concerning for this being unstable angina. Initial EKG showing nonspecific ST-T and T wave changes with sinus bradycardia, with depressions in multiple leads. There are no previous EKGs to compare with. In addition, TWI sometimes can occur in acute stroke. Troponins x3, were obtained and negative. Her risk factors for cardiac chest pain include cigarette smoking, and hyperlipidemia and age of 62. Chest x-ray was negative for any cardiopulmonary process. Other differential include GERD even though she says that her chest pain is different. Overnight cardiac monitoring was uneventful. Today, the patient does not report any chest pain, and she says that in fact, she last had chest pain before taking the nitroglycerin. At this time, the possibility of this chest pain being angina is minimal. This is most likely a typical chest pains, which might be resulting from GERD. Her PCP office does not have a previous EKGs. Plan  -The patient will be evaluated by her primary care physician in 2 weeks - Her PCP will to address her risk factors for coronary artery disease including smoking and hyperlipidemia.  Hyperlipidemia: Patient does not have baseline lipid panel available here. She says, that she's been taking Crestor at 10 mg once daily for the last 6 months.  Plan.  - will be discharged on Crestor at 20mg  once daily Smoking Ceasation: I have counseled her about smoking cessation. The patient understands the complications of cigarette smoking. However, she is not ready to start on any nicotine patches to help her quit. This will be addressed by her primary care physician as outpatient.  Length of Stay: 1 days   Signed by:  Dow Adolph PGY-I, Internal Medicine Pager [phone] 07/01/2012, 4:13 PM

## 2012-07-01 NOTE — ED Provider Notes (Signed)
Medical screening examination/treatment/procedure(s) were conducted as a shared visit with non-physician practitioner(s) and myself.  I personally evaluated the patient during the encounter.  Agree with the history, exam and plan outlined by Magnus Sinning.  Derwood Kaplan, MD 07/01/12 2328

## 2012-07-01 NOTE — Progress Notes (Signed)
Echocardiogram 2D Echocardiogram has been performed.  Tonya [name] 07/01/2012, 10:53 AM

## 2012-07-04 NOTE — Care Management Note (Signed)
    Page 1 of 1   07/04/2012     8:11:59 AM   CARE MANAGEMENT NOTE 07/04/2012  Patient:  Tonya [name], Tonya [name]   Account Number:  [id number]  Date Initiated:  07/01/2012  Documentation initiated by:  [facility]  Subjective/Objective Assessment:   Admitted with TIA.     Action/Plan:   PT/OTevals   Anticipated DC Date:  07/01/2012   Anticipated DC Plan:  HOME/SELF CARE      DC Planning Services  CM consult      Choice offered to / List presented to:             Status of service:  Completed, signed off Medicare Important Message given?   (If response is "NO", the following Medicare IM given date fields will be blank) Date Medicare IM given:   Date Additional Medicare IM given:    Discharge Disposition:  HOME/SELF CARE  Per UR Regulation:  Reviewed for med. necessity/level of care/duration of stay  If discussed at Long Length of Stay Meetings, dates discussed:    Comments:

## 2012-09-14 ENCOUNTER — Other Ambulatory Visit: Payer: Self-pay | Admitting: Obstetrics and Gynecology

## 2012-09-14 DIAGNOSIS — R928 Other abnormal and inconclusive findings on diagnostic imaging of breast: Secondary | ICD-10-CM

## 2012-09-15 ENCOUNTER — Ambulatory Visit
Admission: RE | Admit: 2012-09-15 | Discharge: 2012-09-15 | Disposition: A | Discharge status: Home / Self Care | Payer: BC Managed Care – PPO | Source: Ambulatory Visit | Attending: Obstetrics and Gynecology | Admitting: Obstetrics and Gynecology

## 2012-09-15 DIAGNOSIS — R928 Other abnormal and inconclusive findings on diagnostic imaging of breast: Secondary | ICD-10-CM

## 2012-09-20 ENCOUNTER — Other Ambulatory Visit: Payer: BC Managed Care – PPO

## 2013-06-22 ENCOUNTER — Encounter: Payer: Self-pay | Admitting: Internal Medicine

## 2013-07-25 ENCOUNTER — Encounter: Payer: Self-pay | Admitting: Internal Medicine

## 2013-09-14 ENCOUNTER — Ambulatory Visit (AMBULATORY_SURGERY_CENTER): Payer: Self-pay

## 2013-09-14 VITALS — Ht 64.0 in | Wt 170.0 lb

## 2013-09-14 DIAGNOSIS — Z860100 Personal history of colon polyps, unspecified: Secondary | ICD-10-CM

## 2013-09-14 MED ORDER — MOVIPREP 100 G PO SOLR: 1.0000 | Freq: Once | ORAL | Status: DC

## 2013-09-15 ENCOUNTER — Encounter: Payer: Self-pay | Admitting: Internal Medicine

## 2013-09-28 ENCOUNTER — Encounter: Payer: BC Managed Care – PPO | Admitting: Internal Medicine

## 2013-10-11 ENCOUNTER — Ambulatory Visit (AMBULATORY_SURGERY_CENTER): Payer: 59 | Admitting: Internal Medicine

## 2013-10-11 ENCOUNTER — Encounter: Payer: Self-pay | Admitting: Internal Medicine

## 2013-10-11 VITALS — BP 101/53 | HR 61 | Temp 96.8°F | Resp 16 | Ht 64.0 in | Wt 170.0 lb

## 2013-10-11 DIAGNOSIS — Z860100 Personal history of colon polyps, unspecified: Secondary | ICD-10-CM

## 2013-10-11 DIAGNOSIS — D126 Benign neoplasm of colon, unspecified: Secondary | ICD-10-CM

## 2013-10-11 MED ORDER — SODIUM CHLORIDE 0.9 % IV SOLN: 500.0000 mL | INTRAVENOUS | Status: DC

## 2013-10-11 NOTE — Progress Notes (Signed)
Report to pacu rn, vss, bbs=clear 

## 2013-10-11 NOTE — Op Note (Signed)
[place]  [address] [place], [id number]   COLONOSCOPY PROCEDURE REPORT  PATIENT: Tonya [name], Tonya [name]  MR#: [id number] BIRTHDATE: 09/14/1945 , 67  yrs. old GENDER: Female ENDOSCOPIST: Eustace Quail, MD REFERRED [id number] Program Recall PROCEDURE DATE:  10/11/2013 PROCEDURE:   Colonoscopy with snare polypectomy x 1 First Screening Colonoscopy - Avg.  risk and is 50 yrs.  old or older - No.  Prior Negative Screening - Now for repeat screening. N/A  History of Adenoma - Now for follow-up colonoscopy & has been > or = to 3 yrs.  Yes hx of adenoma.  Has been 3 or more years since last colonoscopy.  Polyps Removed Today? Yes. ASA CLASS:   Class II INDICATIONS:Patient's personal history of adenomatous colon polyps. Index 08-2008 w/ Small TA MEDICATIONS: MAC sedation, administered by CRNA and propofol (Diprivan) 300mg  IV  DESCRIPTION OF PROCEDURE:   After the risks benefits and alternatives of the procedure were thoroughly explained, informed consent was obtained.  A digital rectal exam revealed no abnormalities of the rectum.   The LB [id number]  endoscope was introduced through the anus and advanced to the cecum, which was identified by both the appendix and ileocecal valve. No adverse events experienced.   The quality of the prep was good, using MoviPrep  The instrument was then slowly withdrawn as the colon was fully examined.  COLON FINDINGS: A diminutive polyp was found in the sigmoid colon. A polypectomy was performed with a cold snare.  The resection was complete and the polyp tissue was completely retrieved.   The colon was otherwise normal.  There was no diverticulosis, inflammation, other polyps or cancers .  Retroflexed views revealed no abnormalities. The time to cecum=7 minutes 58 seconds.  Withdrawal time=10 minutes 03 seconds.  The scope was withdrawn and the procedure completed.  COMPLICATIONS: There were no  complications.  ENDOSCOPIC IMPRESSION: 1.   Diminutive polyp was found in the sigmoid colon; polypectomy was performed with a cold snare 2.   The colon was otherwise normal  RECOMMENDATIONS: 1. Repeat colonoscopy in 5 years if polyp adenomatous; otherwise 10 years   eSigned:  Eustace Quail, MD 10/11/2013 12:23 PM   cc: Larina Earthly, MD and The Patient

## 2013-10-11 NOTE — Progress Notes (Signed)
Called to room to assist during endoscopic procedure.  Patient ID and intended procedure confirmed with present staff. Received instructions for my participation in the procedure from the performing physician.  

## 2013-10-11 NOTE — Patient Instructions (Signed)
YOU HAD AN ENDOSCOPIC PROCEDURE TODAY AT THE [facility]: Refer to the procedure report that was given to you for any specific questions about what was found during the examination.  If the procedure report does not answer your questions, please call your gastroenterologist to clarify.  If you requested that your care partner not be given the details of your procedure findings, then the procedure report has been included in a sealed envelope for you to review at your convenience later.  YOU SHOULD EXPECT: Some feelings of bloating in the abdomen. Passage of more gas than usual.  Walking can help get rid of the air that was put into your GI tract during the procedure and reduce the bloating. If you had a lower endoscopy (such as a colonoscopy or flexible sigmoidoscopy) you may notice spotting of blood in your stool or on the toilet paper. If you underwent a bowel prep for your procedure, then you may not have a normal bowel movement for a few days.  DIET: Your first meal following the procedure should be a light meal and then it is ok to progress to your normal diet.  A half-sandwich or bowl of soup is an example of a good first meal.  Heavy or fried foods are harder to digest and may make you feel nauseous or bloated.  Likewise meals heavy in dairy and vegetables can cause extra gas to form and this can also increase the bloating.  Drink plenty of fluids but you should avoid alcoholic beverages for 24 hours.  ACTIVITY: Your care partner should take you home directly after the procedure.  You should plan to take it easy, moving slowly for the rest of the day.  You can resume normal activity the day after the procedure however you should NOT DRIVE or use heavy machinery for 24 hours (because of the sedation medicines used during the test).    SYMPTOMS TO REPORT IMMEDIATELY: A gastroenterologist can be reached at any hour.  During normal business hours, 8:30 AM to 5:00 PM Monday through Friday,  call [phone].  After hours and on weekends, please call the GI answering service at [phone] who will take a message and have the physician on call contact you.   Following lower endoscopy (colonoscopy or flexible sigmoidoscopy):  Excessive amounts of blood in the stool  Significant tenderness or worsening of abdominal pains  Swelling of the abdomen that is new, acute  Fever of 100F or higher    FOLLOW UP: If any biopsies were taken you will be contacted by phone or by letter within the next 1-3 weeks.  Call your gastroenterologist if you have not heard about the biopsies in 3 weeks.  Our staff will call the home number listed on your records the next business day following your procedure to check on you and address any questions or concerns that you may have at that time regarding the information given to you following your procedure. This is a courtesy call and so if there is no answer at the home number and we have not heard from you through the emergency physician on call, we will assume that you have returned to your regular daily activities without incident.  SIGNATURES/CONFIDENTIALITY: You and/or your care partner have signed paperwork which will be entered into your electronic medical record.  These signatures attest to the fact that that the information above on your After Visit Summary has been reviewed and is understood.  Full responsibility of the confidentiality   of this discharge information lies with you and/or your care-partner.     

## 2013-10-12 ENCOUNTER — Telehealth: Payer: Self-pay

## 2013-10-12 NOTE — Telephone Encounter (Signed)
  Follow up Call-  Call back number 10/11/2013  Post procedure Call Back phone  # [phone]  Permission to leave phone message Yes     Patient questions:  Do you have a fever, pain , or abdominal swelling? no Pain Score  0 *  Have you tolerated food without any problems? yes  Have you been able to return to your normal activities? yes  Do you have any questions about your discharge instructions: Diet   no Medications  no Follow up visit  no  Do you have questions or concerns about your Care? no  Actions: * If pain score is 4 or above: No action needed, pain <4.

## 2013-10-16 ENCOUNTER — Encounter: Payer: Self-pay | Admitting: Internal Medicine

## 2015-03-20 ENCOUNTER — Encounter: Payer: Self-pay | Admitting: Internal Medicine

## 2015-03-20 ENCOUNTER — Ambulatory Visit (INDEPENDENT_AMBULATORY_CARE_PROVIDER_SITE_OTHER): Payer: 59 | Admitting: Internal Medicine

## 2015-03-20 ENCOUNTER — Other Ambulatory Visit (INDEPENDENT_AMBULATORY_CARE_PROVIDER_SITE_OTHER): Payer: 59

## 2015-03-20 VITALS — BP 110/60 | HR 64 | Ht 64.0 in | Wt 154.2 lb

## 2015-03-20 DIAGNOSIS — K839 Disease of biliary tract, unspecified: Secondary | ICD-10-CM | POA: Diagnosis not present

## 2015-03-20 DIAGNOSIS — R935 Abnormal findings on diagnostic imaging of other abdominal regions, including retroperitoneum: Secondary | ICD-10-CM | POA: Diagnosis not present

## 2015-03-20 DIAGNOSIS — K219 Gastro-esophageal reflux disease without esophagitis: Secondary | ICD-10-CM | POA: Diagnosis not present

## 2015-03-20 DIAGNOSIS — Z86010 Personal history of colonic polyps: Secondary | ICD-10-CM

## 2015-03-20 LAB — BASIC METABOLIC PANEL
BUN: 11 mg/dL (ref 6–23)
CHLORIDE: 108 meq/L (ref 96–112)
CO2: 28 mEq/L (ref 19–32)
CREATININE: 0.71 mg/dL (ref 0.40–1.20)
Calcium: 9.7 mg/dL (ref 8.4–10.5)
GFR: 105.63 mL/min (ref >60.00)
GLUCOSE: 91 mg/dL (ref 70–99)
POTASSIUM: 4.2 meq/L (ref 3.5–5.1)
Sodium: 142 mEq/L (ref 135–145)

## 2015-03-20 LAB — HEPATIC FUNCTION PANEL
ALK PHOS: 67 U/L (ref 39–117)
ALT: 12 U/L (ref 0–35)
AST: 15 U/L (ref 0–37)
Albumin: 4.1 g/dL (ref 3.5–5.2)
Bilirubin, Direct: 0 mg/dL (ref 0.0–0.3)
Total Bilirubin: 0.3 mg/dL (ref 0.2–1.2)
Total Protein: 7.1 g/dL (ref 6.0–8.3)

## 2015-03-20 NOTE — Patient Instructions (Signed)
Your physician has requested that you go to the basement for the following lab work before leaving today: BMET, LFTs  You have been scheduled for a MRCP at [facility] on 03/27/2015.   Your appointment time is 4:00pm. Please arrive 15 minutes prior to your appointment time for registration purposes. Please make certain not to have anything to eat or drink 6 hours prior to your test. In addition, if you have any metal in your body, have a pacemaker or defibrillator, please be sure to let your ordering physician know. This test typically takes 45 minutes to 1 hour to complete.

## 2015-03-20 NOTE — Progress Notes (Signed)
HISTORY OF PRESENT ILLNESS:  Tonya [name] is a 69 y.o. female with the below listed medical history who is referred by her urologist Dr. Jeffie Pollock regarding abnormal CT scan of the biliary tree. I have seen the patient previously for colonoscopy and a history of adenomatous colon polyps. Index colonoscopy 2009 with small tubular adenoma. Most recent colonoscopy February 2015 with diminutive hyperplastic colon polyp. Her current history is that of microscopic hematuria found by her primary care provider. She was subsequently referred to urology for workup (outside notes reviewed). She reports negative cystoscopy. Subsequently had a CT scan with and without contrast. This was said to show intra-and extrahepatic biliary dilation with the common bile duct measuring a maximum of 9.5 mm. There was no obvious cause for the biliary dilation such as pancreatic head mass. A small duodenal diverticulum near the pancreatic head was noted the pancreatic duct was felt to be normal. She is now referred. The patient denies abdominal pain, significant weight loss, jaundice, steatorrhea, change in stool color, or pruritus. No family history of biliary or pancreatic cancer. GI review of systems is remarkable for GERD which is managed with omeprazole 20 mg daily. She does have some slightly alternating bowel habits and trivial rectal bleeding due to hemorrhoids.  REVIEW OF SYSTEMS:  All non-GI ROS negative except for sinus and allergy, hematuria, visual change, night sweats, sleeping problems  Past Medical History  Diagnosis Date  . GERD (gastroesophageal reflux disease)   . Kidney stone   . Microhematuria   . Colon polyps     hyperplastic  . Adenoma of left adrenal gland   . Liver lesion     Past Surgical History  Procedure Laterality Date  . Abdominal hysterectomy    . Appendectomy      Social History Tonya [name]  reports that she has been smoking Cigarettes.  She has a 18 pack-year smoking history.  She has never used smokeless tobacco. She reports that she drinks alcohol. She reports that she does not use illicit drugs.  family history includes Arrhythmia in her mother; Heart attack in her father. There is no history of Colon cancer.  Allergies  Allergen Reactions  . Penicillins     REACTION: hives       PHYSICAL EXAMINATION: Vital signs: BP 110/60 mmHg  Pulse 64  Ht '5\' 4"'$  (1.626 m)  Wt 154 lb 4 oz (69.967 kg)  BMI 26.46 kg/m2  Constitutional: generally well-appearing, no acute distress Psychiatric: alert and oriented x3, cooperative Eyes: extraocular movements intact, anicteric, conjunctiva pink Mouth: oral pharynx moist, no lesions Neck: supple no lymphadenopathy Cardiovascular: heart regular rate and rhythm, no murmur Lungs: clear to auscultation bilaterally Abdomen: soft, nontender, nondistended, no obvious ascites, no peritoneal signs, normal bowel sounds, no organomegaly Rectal: Not repeated. Normal last year Extremities: no clubbing cyanosis or lower extremity edema bilaterally Skin: no lesions on visible extremities Neuro: No focal deficits. No asterixis.   ASSESSMENT:  #1. Biliary ductal dilation on CT. Felt to be abnormal. No radiographic or clinical cause apparent #2. History of adenomatous colon polyps. Surveillance up-to-date #3. Chronic GERD. Controlled with omeprazole   PLAN:  #1. Obtain liver function test today. #2. Schedule MRCP to evaluate the biliary system for obstructive lesions #3. Reflux precautions #4. Continue omeprazole #5. Surveillance colonoscopy 10 years (2025) #6. Will contact patient with the results when available and schedule follow-up if needed  ADDENDUM Liver tests today returned normal. They're unchanged from 7 years ago. Await MRCP  A  copy of this consultation on has been sent to Dr. Jeffie Pollock. Dr. Radene Knee and Dr. Ayesha Rumpf

## 2015-03-27 ENCOUNTER — Other Ambulatory Visit: Payer: Self-pay | Admitting: Internal Medicine

## 2015-03-27 ENCOUNTER — Ambulatory Visit (HOSPITAL_COMMUNITY)
Admission: RE | Admit: 2015-03-27 | Discharge: 2015-03-27 | Disposition: A | Discharge status: Home / Self Care | Payer: 59 | Source: Ambulatory Visit | Attending: Internal Medicine | Admitting: Internal Medicine

## 2015-03-27 DIAGNOSIS — R935 Abnormal findings on diagnostic imaging of other abdominal regions, including retroperitoneum: Secondary | ICD-10-CM

## 2015-03-27 DIAGNOSIS — R7989 Other specified abnormal findings of blood chemistry: Secondary | ICD-10-CM | POA: Diagnosis not present

## 2015-03-27 DIAGNOSIS — D1803 Hemangioma of intra-abdominal structures: Secondary | ICD-10-CM | POA: Diagnosis not present

## 2015-03-27 DIAGNOSIS — D3502 Benign neoplasm of left adrenal gland: Secondary | ICD-10-CM | POA: Diagnosis not present

## 2015-03-27 DIAGNOSIS — K839 Disease of biliary tract, unspecified: Secondary | ICD-10-CM

## 2015-03-27 MED ORDER — GADOBENATE DIMEGLUMINE 529 MG/ML IV SOLN
15.0000 mL | Freq: Once | INTRAVENOUS | Status: AC | PRN
  Administered 2015-03-27: 13 mL via INTRAVENOUS

## 2015-03-29 ENCOUNTER — Telehealth: Payer: Self-pay | Admitting: Internal Medicine

## 2015-03-29 NOTE — Telephone Encounter (Signed)
Results given to patient

## 2015-04-23 ENCOUNTER — Encounter: Payer: Self-pay | Admitting: Internal Medicine

## 2015-05-20 ENCOUNTER — Other Ambulatory Visit: Payer: Self-pay | Admitting: Obstetrics and Gynecology

## 2015-05-21 LAB — CYTOLOGY - PAP

## 2016-09-03 ENCOUNTER — Emergency Department (HOSPITAL_COMMUNITY): Payer: 59

## 2016-09-03 ENCOUNTER — Encounter (HOSPITAL_COMMUNITY): Payer: Self-pay | Admitting: Emergency Medicine

## 2016-09-03 ENCOUNTER — Emergency Department (HOSPITAL_COMMUNITY)
Admission: EM | Admit: 2016-09-03 | Discharge: 2016-09-03 | Disposition: A | Discharge status: Home / Self Care | Payer: 59 | Attending: Emergency Medicine | Admitting: Emergency Medicine

## 2016-09-03 DIAGNOSIS — Z7982 Long term (current) use of aspirin: Secondary | ICD-10-CM | POA: Insufficient documentation

## 2016-09-03 DIAGNOSIS — R0602 Shortness of breath: Secondary | ICD-10-CM | POA: Diagnosis not present

## 2016-09-03 DIAGNOSIS — F17210 Nicotine dependence, cigarettes, uncomplicated: Secondary | ICD-10-CM | POA: Insufficient documentation

## 2016-09-03 LAB — CBC WITH DIFFERENTIAL/PLATELET
BASOS PCT: 0 %
Basophils Absolute: 0 10*3/uL (ref 0.0–0.1)
EOS PCT: 1 %
Eosinophils Absolute: 0.1 10*3/uL (ref 0.0–0.7)
HCT: 39.8 % (ref 36.0–46.0)
HEMOGLOBIN: 13 g/dL (ref 12.0–15.0)
LYMPHS PCT: 54 %
Lymphs Abs: 6.4 10*3/uL — ABNORMAL HIGH (ref 0.7–4.0)
MCH: 32.4 pg (ref 26.0–34.0)
MCHC: 32.7 g/dL (ref 30.0–36.0)
MCV: 99.3 fL (ref 78.0–100.0)
MONOS PCT: 8 %
Monocytes Absolute: 0.9 10*3/uL (ref 0.1–1.0)
NEUTROS PCT: 37 %
Neutro Abs: 4.3 10*3/uL (ref 1.7–7.7)
Platelets: 293 10*3/uL (ref 150–400)
RBC: 4.01 MIL/uL (ref 3.87–5.11)
RDW: 13.6 % (ref 11.5–15.5)
WBC: 11.7 10*3/uL — ABNORMAL HIGH (ref 4.0–10.5)

## 2016-09-03 LAB — I-STAT CHEM 8, ED
BUN: 15 mg/dL (ref 6–20)
CALCIUM ION: 1.16 mmol/L (ref 1.15–1.40)
CREATININE: 0.9 mg/dL (ref 0.44–1.00)
Chloride: 104 mmol/L (ref 101–111)
Glucose, Bld: 100 mg/dL — ABNORMAL HIGH (ref 65–99)
HEMATOCRIT: 39 % (ref 36.0–46.0)
HEMOGLOBIN: 13.3 g/dL (ref 12.0–15.0)
Potassium: 3.9 mmol/L (ref 3.5–5.1)
SODIUM: 142 mmol/L (ref 135–145)
TCO2: 27 mmol/L (ref 0–100)

## 2016-09-03 LAB — COMPREHENSIVE METABOLIC PANEL
ALK PHOS: 67 U/L (ref 38–126)
ALT: 18 U/L (ref 14–54)
AST: 21 U/L (ref 15–41)
Albumin: 3.8 g/dL (ref 3.5–5.0)
Anion gap: 10 (ref 5–15)
BUN: 13 mg/dL (ref 6–20)
CALCIUM: 9.2 mg/dL (ref 8.9–10.3)
CO2: 25 mmol/L (ref 22–32)
CREATININE: 0.86 mg/dL (ref 0.44–1.00)
Chloride: 106 mmol/L (ref 101–111)
GFR calc Af Amer: >60 mL/min (ref >60)
Glucose, Bld: 100 mg/dL — ABNORMAL HIGH (ref 65–99)
Potassium: 3.9 mmol/L (ref 3.5–5.1)
Sodium: 141 mmol/L (ref 135–145)
Total Bilirubin: 0.3 mg/dL (ref 0.3–1.2)
Total Protein: 6.5 g/dL (ref 6.5–8.1)

## 2016-09-03 MED ORDER — IOPAMIDOL (ISOVUE-370) INJECTION 76%
INTRAVENOUS | Status: AC
Start: 2016-09-03 — End: 2016-09-03
  Administered 2016-09-03: 100 mL
  Filled 2016-09-03: qty 100

## 2016-09-03 NOTE — ED Triage Notes (Signed)
Patient received a call from [facility] urgent care and advised her to go to ER for CT angio - chest due to elevated D-dimer result from her blood test done last Friday 08/28/16 . Denies SOB at triage .

## 2016-09-03 NOTE — ED Notes (Signed)
Dr. Maryruth Bun notified on pt.'s condition , advised nurse to order blood tests .

## 2016-09-03 NOTE — ED Provider Notes (Signed)
[place] DEPT Provider Note   CSN: [id number] Arrival date & time: 09/03/16  1902     History   Chief Complaint Chief Complaint  Patient presents with  . Elevated D-dimer    HPI Tonya [name] is a 70 y.o. female.  70 yo F with a chief complaint of shortness of breath. This occurred with cough and congestion. She went to urgent care where she was found to have a URI and was sent home. They obtained a d-dimer at this time which was elevated. She was called back today and sent to the ED for a CT angios of the chest. The patient feels that her symptoms are significantly improved. She felt that her shortness of breath was mostly related to her nasal congestion. She did smoke in the past. Denies lower extremity edema. Denies history of PE or DVT.    The history is provided by the patient and the spouse.  Illness  This is a new problem. The current episode started more than 2 days ago. The problem occurs constantly. The problem has been gradually improving. Associated symptoms include shortness of breath. Pertinent negatives include no chest pain and no headaches. Nothing aggravates the symptoms. Nothing relieves the symptoms. She has tried nothing for the symptoms. The treatment provided no relief.    Past Medical History:  Diagnosis Date  . Adenoma of left adrenal gland   . Colon polyps    hyperplastic  . GERD (gastroesophageal reflux disease)   . Kidney stone   . Liver lesion   . Microhematuria     Patient Active Problem List   Diagnosis Date Noted  . Numbness and tingling of left arm and leg 07/01/2012  . Hyperlipidemia LDL goal < 100 07/01/2012  . Chest pressure 06/30/2012  . GERD (gastroesophageal reflux disease) 06/30/2012    Past Surgical History:  Procedure Laterality Date  . ABDOMINAL HYSTERECTOMY    . APPENDECTOMY      OB History    No data available       Home Medications    Prior to Admission medications   Medication Sig Start Date  End Date Taking? Authorizing Provider  aspirin 81 MG tablet Take 1 tablet (81 mg total) by mouth daily. 07/01/12   Jessee Avers, MD  Calcium Carbonate-Vitamin D (CALCIUM + D PO) Take 1 tablet by mouth daily.    Historical Provider, MD  Multiple Vitamin (MULTIVITAMIN WITH MINERALS) TABS Take 1 tablet by mouth daily.    Historical Provider, MD  omeprazole (PRILOSEC) 20 MG capsule Take 20 mg by mouth daily.    Historical Provider, MD  oxyCODONE-acetaminophen (PERCOCET) 7.5-325 MG per tablet Take 1 tablet by mouth every 4 (four) hours as needed.    Historical Provider, MD  rosuvastatin (CRESTOR) 20 MG tablet Take 1 tablet (20 mg total) by mouth daily. 07/01/12   Rosalia Hammers, MD    Family History Family History  Problem Relation Age of Onset  . Arrhythmia Mother     s/p Pacer   . Heart attack Father   . Colon cancer Neg Hx     Social History Social History  Substance Use Topics  . Smoking status: Current Every Day Smoker    Packs/day: 0.30    Years: 60.00    Types: Cigarettes  . Smokeless tobacco: Never Used  . Alcohol use Yes     Comment: 0.5 drink per month     Allergies   Penicillins   Review of Systems Review of Systems  Constitutional: Negative for chills and fever.  HENT: Positive for congestion. Negative for rhinorrhea.   Eyes: Negative for redness and visual disturbance.  Respiratory: Positive for cough and shortness of breath. Negative for wheezing.   Cardiovascular: Negative for chest pain and palpitations.  Gastrointestinal: Negative for nausea and vomiting.  Genitourinary: Negative for dysuria and urgency.  Musculoskeletal: Negative for arthralgias and myalgias.  Skin: Negative for pallor and wound.  Neurological: Negative for dizziness and headaches.     Physical Exam Updated Vital Signs BP 130/73 (BP Location: Right Arm)   Pulse (!) 55   Temp 98 F (36.7 C) (Oral)   Resp 18   Ht '5\' 4"'$  (1.626 m)   Wt 150 lb (68 kg)   SpO2 99%   BMI 25.75 kg/m    Physical Exam  Constitutional: She is oriented to person, place, and time. She appears well-developed and well-nourished. No distress.  HENT:  Head: Normocephalic and atraumatic.  Eyes: EOM are normal. Pupils are equal, round, and reactive to light.  Neck: Normal range of motion. Neck supple.  Cardiovascular: Normal rate and regular rhythm.  Exam reveals no gallop and no friction rub.   No murmur heard. Pulmonary/Chest: Effort normal. She has no wheezes. She has no rales.  Abdominal: Soft. She exhibits no distension and no mass. There is no tenderness. There is no guarding.  Musculoskeletal: She exhibits no edema or tenderness.  Neurological: She is alert and oriented to person, place, and time.  Skin: Skin is warm and dry. She is not diaphoretic.  Psychiatric: She has a normal mood and affect. Her behavior is normal.  Nursing note and vitals reviewed.    ED Treatments / Results  Labs (all labs ordered are listed, but only abnormal results are displayed) Labs Reviewed  CBC WITH DIFFERENTIAL/PLATELET - Abnormal; Notable for the following:       Result Value   WBC 11.7 (*)    Lymphs Abs 6.4 (*)    All other components within normal limits  COMPREHENSIVE METABOLIC PANEL - Abnormal; Notable for the following:    Glucose, Bld 100 (*)    All other components within normal limits  I-STAT CHEM 8, ED - Abnormal; Notable for the following:    Glucose, Bld 100 (*)    All other components within normal limits    EKG  EKG Interpretation None       Radiology Ct Angio Chest Pe W And/or Wo Contrast  Result Date: 09/03/2016 CLINICAL DATA:  Chest pressure, elevated D-dimer. EXAM: CT ANGIOGRAPHY CHEST WITH CONTRAST TECHNIQUE: Multidetector CT imaging of the chest was performed using the standard protocol during bolus administration of intravenous contrast. Multiplanar CT image reconstructions and MIPs were obtained to evaluate the vascular anatomy. CONTRAST:  100 cc Isovue 370 IV  COMPARISON:  CT from 01/10/2004 FINDINGS: Cardiovascular: The study is of quality for the evaluation of pulmonary embolism. There are no filling defects in the central, lobar, segmental or subsegmental pulmonary artery branches to suggest acute pulmonary embolism. Great vessels are normal in course and caliber. Mild cardiomegaly. No significant pericardial fluid/thickening. Aortic atherosclerosis noted without aneurysm or dissection. Mediastinum/Nodes: Thyromegaly is again noted extending to the level of the clavicular heads without focal mass. Unremarkable esophagus. No pathologically enlarged axillary, mediastinal or hilar lymph nodes. Lungs/Pleura: No pneumothorax. No pleural effusion. Mild centrilobular emphysema predominantly upper lobe distribution with scattered noncalcified pulmonary nodules noted bilaterally, the largest in the right lower lobe measuring 6.7 mm avg (Ser 6/image 60), right middle  lobe nodule measuring 5.4 mm average (6/37), left upper lobe 4.4 mm avg (6/15), and left lower lobe 3.4 mm avg (6/52). Tiny subpleural left upper lobe nodular densities also seen on the order of 3.5 mm to 3.7 mm (6/26). Right upper lobe subsegmental atelectasis noted in the periphery. Upper abdomen: 3 mm nonobstructing right upper pole renal calculus. Partially visualized left renal cyst measuring 2.8 cm. No adrenal mass. Visualized liver, spleen and pancreas are unremarkable. Musculoskeletal: No aggressive appearing focal osseous lesions. S-shaped scoliosis of the dorsal spine. Review of the MIP images confirms the above findings. IMPRESSION: 1. No acute pulmonary embolus. 2. Mild centrilobular emphysema with scattered noncalcified pulmonary nodules the largest approximately 6.7 mm in average the right lower lobe not definitely visualized on old remote CT. Non-contrast chest CT at 3-6 months is recommended. If the nodules are stable at time of repeat CT, then future CT at 18-24 months (from today's scan) is  considered optional for low-risk patients, but is recommended for high-risk patients. This recommendation follows the consensus statement: Guidelines for Management of Incidental Pulmonary Nodules Detected on CT Images: From the Fleischner Society 2017; Radiology 2017; 284:228-243. 3. Stable thyromegaly. 4. Aortic atherosclerosis. 5. S-shaped scoliotic curvature of the dorsal spine as above. 6. Right sided nephrolithiasis and partially visualized left 2.8 cm renal cyst. Electronically Signed   By: Ashley Royalty M.D.   On: 09/03/2016 21:13    Procedures Procedures (including critical care time)  Medications Ordered in ED Medications  iopamidol (ISOVUE-370) 76 % injection (100 mLs  Contrast Given 09/03/16 2038)     Initial Impression / Assessment and Plan / ED Course  I have reviewed the triage vital signs and the nursing notes.  Pertinent labs & imaging results that were available during my care of the patient were reviewed by me and considered in my medical decision making (see chart for details).  Clinical Course     70 yo F she went to urgent care and had an elevated ddimer a week ago. She was called and suggested she come to the ED.  Symptoms are significantly improved, CT angio negative for PE.  Incidental pulmonary nodules noted. Patient will follow up with their PCP with hx of smoking repeat CT in 3-6 mos.    11:21 PM:  I have discussed the diagnosis/risks/treatment options with the patient and family and believe the pt to be eligible for discharge home to follow-up with PCP. We also discussed returning to the ED immediately if new or worsening sx occur. We discussed the sx which are most concerning (e.g., sudden worsening pain, fever, inability to tolerate by mouth) that necessitate immediate return. Medications administered to the patient during their visit and any new prescriptions provided to the patient are listed below.  Medications given during this visit Medications  iopamidol  (ISOVUE-370) 76 % injection (100 mLs  Contrast Given 09/03/16 2038)     The patient appears reasonably screen and/or stabilized for discharge and I doubt any other medical condition or other [facility] requiring further screening, evaluation, or treatment in the ED at this time prior to discharge.    Final Clinical Impressions(s) / ED Diagnoses   Final diagnoses:  Shortness of breath    New Prescriptions Discharge Medication List as of 09/03/2016 10:37 PM       Deno Etienne, DO 09/03/16 2321

## 2016-09-03 NOTE — ED Notes (Signed)
Nurse will cancel d-dimer

## 2016-09-03 NOTE — Discharge Instructions (Signed)
Follow up with your family doc.  You need a repeat CT of the chest to evaluate a nodule seen.  This needs to be done in 3-6 months.

## 2016-09-04 LAB — PATHOLOGIST SMEAR REVIEW: Path Review: REACTIVE

## 2017-11-22 ENCOUNTER — Other Ambulatory Visit: Payer: Self-pay | Admitting: Family Medicine

## 2017-11-22 DIAGNOSIS — M5416 Radiculopathy, lumbar region: Secondary | ICD-10-CM

## 2017-11-24 ENCOUNTER — Other Ambulatory Visit: Payer: Self-pay | Admitting: Family Medicine

## 2017-11-24 ENCOUNTER — Ambulatory Visit: Payer: 59

## 2017-11-24 DIAGNOSIS — M5416 Radiculopathy, lumbar region: Secondary | ICD-10-CM

## 2017-11-28 ENCOUNTER — Ambulatory Visit
Admission: RE | Admit: 2017-11-28 | Discharge: 2017-11-28 | Disposition: A | Discharge status: Home / Self Care | Payer: 59 | Source: Ambulatory Visit | Attending: Family Medicine | Admitting: Family Medicine

## 2017-11-28 DIAGNOSIS — M5416 Radiculopathy, lumbar region: Secondary | ICD-10-CM

## 2019-11-09 ENCOUNTER — Ambulatory Visit: Payer: Medicare HMO | Attending: Internal Medicine

## 2019-11-09 DIAGNOSIS — Z23 Encounter for immunization: Secondary | ICD-10-CM | POA: Insufficient documentation

## 2019-11-09 NOTE — Progress Notes (Signed)
   Covid-19 Vaccination Clinic  Name:  Tonya [name]    MRN: [id number] DOB: Feb 25, 1946  11/09/2019  Tonya [name] was observed post Covid-19 immunization for 15 minutes without incident. She was provided with Vaccine Information Sheet and instruction to access the V-Safe system.   Tonya [name] was instructed to call 911 with any severe reactions post vaccine: Marland Kitchen Difficulty breathing  . Swelling of face and throat  . A fast heartbeat  . A bad rash all over body  . Dizziness and weakness   Immunizations Administered    Name Date Dose VIS Date Route   Pfizer COVID-19 Vaccine 11/09/2019  8:36 AM 0.3 mL 08/18/2019 Intramuscular   Manufacturer: [place]   Lot: [id number]   [place]: [id number]

## 2019-12-05 ENCOUNTER — Ambulatory Visit: Payer: Medicare HMO | Attending: Internal Medicine

## 2019-12-05 DIAGNOSIS — Z23 Encounter for immunization: Secondary | ICD-10-CM

## 2019-12-05 NOTE — Progress Notes (Signed)
   Covid-19 Vaccination Clinic  Name:  Tonya [name]    MRN: [id number] DOB: 1946-04-15  12/05/2019  Tonya [name] was observed post Covid-19 immunization for 15 minutes without incident. She was provided with Vaccine Information Sheet and instruction to access the V-Safe system.   Tonya [name] was instructed to call 911 with any severe reactions post vaccine: Marland Kitchen Difficulty breathing  . Swelling of face and throat  . A fast heartbeat  . A bad rash all over body  . Dizziness and weakness   Immunizations Administered    Name Date Dose VIS Date Route   Pfizer COVID-19 Vaccine 12/05/2019  1:22 PM 0.3 mL 08/18/2019 Intramuscular   Manufacturer: [place]   Lot: [id number]   [place]: [id number]

## 2020-04-01 ENCOUNTER — Emergency Department (HOSPITAL_COMMUNITY): Payer: Medicare HMO

## 2020-04-01 ENCOUNTER — Emergency Department (HOSPITAL_COMMUNITY)
Admission: EM | Admit: 2020-04-01 | Discharge: 2020-04-02 | Disposition: A | Discharge status: Home / Self Care | Payer: Medicare HMO | Attending: Emergency Medicine | Admitting: Emergency Medicine

## 2020-04-01 DIAGNOSIS — Z79899 Other long term (current) drug therapy: Secondary | ICD-10-CM | POA: Diagnosis not present

## 2020-04-01 DIAGNOSIS — F17210 Nicotine dependence, cigarettes, uncomplicated: Secondary | ICD-10-CM | POA: Diagnosis not present

## 2020-04-01 DIAGNOSIS — M542 Cervicalgia: Secondary | ICD-10-CM | POA: Diagnosis not present

## 2020-04-01 DIAGNOSIS — D3500 Benign neoplasm of unspecified adrenal gland: Secondary | ICD-10-CM | POA: Diagnosis not present

## 2020-04-01 LAB — BASIC METABOLIC PANEL
Anion gap: 9 (ref 5–15)
BUN: 6 mg/dL — ABNORMAL LOW (ref 8–23)
CO2: 22 mmol/L (ref 22–32)
Calcium: 9.3 mg/dL (ref 8.9–10.3)
Chloride: 105 mmol/L (ref 98–111)
Creatinine, Ser: 0.65 mg/dL (ref 0.44–1.00)
GFR calc Af Amer: >60 mL/min (ref >60)
GFR calc non Af Amer: >60 mL/min (ref >60)
Glucose, Bld: 98 mg/dL (ref 70–99)
Potassium: 4.1 mmol/L (ref 3.5–5.1)
Sodium: 136 mmol/L (ref 135–145)

## 2020-04-01 LAB — CBC
HCT: 41.6 % (ref 36.0–46.0)
Hemoglobin: 13.3 g/dL (ref 12.0–15.0)
MCH: 32.4 pg (ref 26.0–34.0)
MCHC: 32 g/dL (ref 30.0–36.0)
MCV: 101.5 fL — ABNORMAL HIGH (ref 80.0–100.0)
Platelets: 245 10*3/uL (ref 150–400)
RBC: 4.1 MIL/uL (ref 3.87–5.11)
RDW: 11.9 % (ref 11.5–15.5)
WBC: 5.6 10*3/uL (ref 4.0–10.5)
nRBC: 0 % (ref 0.0–0.2)

## 2020-04-01 LAB — TROPONIN I (HIGH SENSITIVITY)
Troponin I (High Sensitivity): 3 ng/L (ref <18)
Troponin I (High Sensitivity): 4 ng/L (ref <18)

## 2020-04-01 MED ORDER — SODIUM CHLORIDE 0.9% FLUSH: 3.0000 mL | Freq: Once | INTRAVENOUS | Status: DC

## 2020-04-01 NOTE — ED Triage Notes (Signed)
Pt arrives pov with reports of L sided headache, Chest pressure radiating to her L arm with some shortness of breath. Pt states initial onset 3 days ago with worsening yesterday.

## 2020-04-02 ENCOUNTER — Encounter (HOSPITAL_COMMUNITY): Payer: Self-pay | Admitting: Student

## 2020-04-02 MED ORDER — LIDOCAINE 5 % EX PTCH: 1.0000 | MEDICATED_PATCH | Freq: Every day | CUTANEOUS | 0 refills | Status: DC | PRN

## 2020-04-02 MED ORDER — NAPROXEN 375 MG PO TABS: 375.0000 mg | ORAL_TABLET | Freq: Two times a day (BID) | ORAL | 0 refills | Status: DC | PRN

## 2020-04-02 NOTE — Discharge Instructions (Addendum)
You were seen in the emergency department today for chest pain. Your work-up in the emergency department has been overall reassuring. Your labs have been fairly normal and or similar to previous blood work you have had done. Your EKG and the enzyme we use to check your heart did not show an acute heart attack at this time. Your chest x-ray did not show significant abnormalities.   We suspect that your pain may be due to a musculoskeletal issue, we are sending home with Lidoderm patches and naproxen.  Please apply Lidoderm patch to your area most significant pain in the neck once per day, remove within 12 hours.  This should help numb/soothe the muscle. Naproxen is a nonsteroidal anti-inflammatory medication that will help with pain and swelling. Be sure to take this medication as prescribed with food, 1 pill every 12 hours,  It should be taken with food, as it can cause stomach upset, and more seriously, stomach bleeding. Do not take other nonsteroidal anti-inflammatory medications with this such as Advil, Motrin, Aleve, Mobic, Goodie Powder, or Motrin.    You make take Tylenol per over the counter dosing with these medications.   We have prescribed you new medication(s) today. Discuss the medications prescribed today with your pharmacist as they can have adverse effects and interactions with your other medicines including over the counter and prescribed medications. Seek medical evaluation if you start to experience new or abnormal symptoms after taking one of these medicines, seek care immediately if you start to experience difficulty breathing, feeling of your throat closing, facial swelling, or rash as these could be indications of a more serious allergic reaction   We would like you to follow up closely with your primary care provider as well as the cardiologist provided in your discharge instructions within 1-3 days. Return to the ER immediately should you experience any new or worsening symptoms  including but not limited to return of pain, worsened pain, vomiting, shortness of breath, dizziness, lightheadedness, passing out, new numbness, weakness, or any other concerns that you may have.

## 2020-04-02 NOTE — ED Provider Notes (Signed)
Medical screening examination/treatment/procedure(s) were conducted as a shared visit with non-physician practitioner(s) and myself.  I personally evaluated the patient during the encounter. Briefly, the patient is a 74 y.o. female with history of high cholesterol who presents to the ED with neck pain.  Patient overall unremarkable vitals.  Upon my evaluation patient already with 2 troponins that are normal.  EKG does show some T wave inversions but have been there in the past.  No active chest pain.  Has been having some intermittent chest pain but mostly, from neck.  Doubt ACS.  No PE risk factors.  Chest x-ray shows no signs of pneumonia or pneumothorax.  Lab work sent shows no significant anemia, electrolyte abnormality, kidney injury.  It appears that her main concern is left-sided neck pain.  This seems musculoskeletal in origin.  She has normal neurological exam.  Normal strength and sensation of the left upper extremity.  No concern for stroke.  Recommend anti-inflammatories and follow-up with primary care doctor.  Given return precautions.  This chart was dictated using voice recognition software.  Despite best efforts to proofread,  errors can occur which can change the documentation meaning.     EKG Interpretation  Date/Time:  Monday April 01 2020 17:08:18 EDT Ventricular Rate:  55 PR Interval:  126 QRS Duration: 74 QT Interval:  400 QTC Calculation: 382 R Axis:   20 Text Interpretation: Sinus bradycardia ST & T wave abnormality, consider inferior ischemia ST & T wave abnormality, consider anterolateral ischemia Abnormal ECG Nonspecific T wave abnormality Confirmed by Ezequiel Essex [phone]) on 04/02/2020 6:22:34 AM           Lennice Sites, DO 04/02/20 9509

## 2020-04-02 NOTE — ED Provider Notes (Signed)
[facility] EMERGENCY DEPARTMENT Provider Note   CSN: [id number] Arrival date & time: 04/01/20  1638     History Chief Complaint  Patient presents with   Neck Pain    Tonya [name] is a 74 y.o. female with a history of kidney stones, GERD, & hyperlipidemia who presents to the ED with complaints of neck pain for the past few days. Patient states the pain is located to the left side of the neck/base of the skull & upper back, it is constant, feels like pressure, at time has some intermittent pressure that occurs in the chest as well also has some intermittent LUE paresthesias- chest pressure & paresthesias do not occur concomitantly. She has no active chest pain at this time. She takes oxycodone for chronic back pain which does alleviated the pain, no other alleviating/aggravating factors to her sxs. Denies visual disturbance, other areas of paresthesias, weakness, dizziness, gait disturbance, slurred speech, dyspnea, vomiting, diaphoresis, syncope, or recent injury. Patient has had similar sxs intermittently for a few months, more prominent over past 3 days  HPI     Past Medical History:  Diagnosis Date   Adenoma of left adrenal gland    Colon polyps    hyperplastic   GERD (gastroesophageal reflux disease)    Kidney stone    Liver lesion    Microhematuria     Patient Active Problem List   Diagnosis Date Noted   Numbness and tingling of left arm and leg 07/01/2012   Hyperlipidemia LDL goal < 100 07/01/2012   Chest pressure 06/30/2012   GERD (gastroesophageal reflux disease) 06/30/2012    Past Surgical History:  Procedure Laterality Date   ABDOMINAL HYSTERECTOMY     APPENDECTOMY       OB History   No obstetric history on file.     Family History  Problem Relation Age of Onset   Arrhythmia Mother        s/p Pacer    Heart attack Father    Colon cancer Neg Hx     Social History   Tobacco Use   Smoking status:  Current Every Day Smoker    Packs/day: 0.30    Years: 60.00    Pack years: 18.00    Types: Cigarettes   Smokeless tobacco: Never Used  Substance Use Topics   Alcohol use: Yes    Comment: 0.5 drink per month   Drug use: No    Home Medications Prior to Admission medications   Medication Sig Start Date End Date Taking? Authorizing Provider  aspirin 81 MG tablet Take 1 tablet (81 mg total) by mouth daily. 07/01/12   Jessee Avers, MD  Calcium Carbonate-Vitamin D (CALCIUM + D PO) Take 1 tablet by mouth daily.    [provider]  Multiple Vitamin (MULTIVITAMIN WITH MINERALS) TABS Take 1 tablet by mouth daily.    [provider]  omeprazole (PRILOSEC) 20 MG capsule Take 20 mg by mouth daily.    [provider]  oxyCODONE-acetaminophen (PERCOCET) 7.5-325 MG per tablet Take 1 tablet by mouth every 4 (four) hours as needed.    [provider]  rosuvastatin (CRESTOR) 20 MG tablet Take 1 tablet (20 mg total) by mouth daily. 07/01/12   Rosalia Hammers, MD    Allergies    Penicillins  Review of Systems   Review of Systems  Constitutional: Negative for chills and fever.  Eyes: Negative for visual disturbance.  Respiratory: Negative for shortness of breath.   Cardiovascular: Positive  for chest pain.  Gastrointestinal: Negative for abdominal pain, constipation and vomiting.  Musculoskeletal: Positive for back pain and neck pain.  Neurological: Positive for headaches (base of the skull/neck). Negative for dizziness, facial asymmetry, weakness and numbness.       Positive for intermittent paresthesias to the LUE- not at present.   All other systems reviewed and are negative.   Physical Exam Updated Vital Signs BP (!) 137/71    Pulse 57    Temp 98.5 F (36.9 C) (Oral)    Resp 20    SpO2 98%   Physical Exam Vitals and nursing note reviewed.  Constitutional:      General: She is not in acute distress.    Appearance: She is well-developed. She is not  toxic-appearing.  HENT:     Head: Normocephalic and atraumatic.  Eyes:     General: Vision grossly intact. Gaze aligned appropriately.        Right eye: No discharge.        Left eye: No discharge.     Extraocular Movements: Extraocular movements intact.     Conjunctiva/sclera: Conjunctivae normal.     Comments: PERRL. No proptosis.   Cardiovascular:     Rate and Rhythm: Normal rate and regular rhythm.     Comments: 2+ symmetric radial pulses.  Pulmonary:     Effort: Pulmonary effort is normal. No respiratory distress.     Breath sounds: Normal breath sounds. No wheezing, rhonchi or rales.  Abdominal:     General: There is no distension.     Palpations: Abdomen is soft.     Tenderness: There is no abdominal tenderness. There is no guarding or rebound.  Musculoskeletal:     Cervical back: Normal range of motion and neck supple. No rigidity. Muscular tenderness (left paraspinal muscles ) present. No spinous process tenderness.     Comments: No edema, erythema, pallor, or abnormal temperature noted x 4  Skin:    General: Skin is warm and dry.     Findings: No rash.  Neurological:     Comments: Alert. Clear speech. No facial droop. CNIII-XII grossly intact. Bilateral upper and lower extremities' sensation grossly intact. 5/5 symmetric strength with grip strength and strength with elbow/shoulder flexion/extension and with plantar and dorsi flexion bilaterally . Normal finger to nose bilaterally. Negative pronator drift. Gait is steady and intact.   Psychiatric:        Behavior: Behavior normal.    ED Results / Procedures / Treatments   Labs (all labs ordered are listed, but only abnormal results are displayed) Labs Reviewed  BASIC METABOLIC PANEL - Abnormal; Notable for the following components:      Result Value   BUN 6 (*)    All other components within normal limits  CBC - Abnormal; Notable for the following components:   MCV 101.5 (*)    All other components within normal  limits  TROPONIN I (HIGH SENSITIVITY)  TROPONIN I (HIGH SENSITIVITY)    EKG EKG Interpretation  Date/Time:  Monday April 01 2020 17:08:18 EDT Ventricular Rate:  55 PR Interval:  126 QRS Duration: 74 QT Interval:  400 QTC Calculation: 382 R Axis:   20 Text Interpretation: Sinus bradycardia ST & T wave abnormality, consider inferior ischemia ST & T wave abnormality, consider anterolateral ischemia Abnormal ECG Nonspecific T wave abnormality Confirmed by Ezequiel Essex [phone]) on 04/02/2020 6:22:34 AM   Radiology DG Chest 2 View  Result Date: 04/01/2020 CLINICAL DATA:  74 year old female with chest  pain and pressure radiating to the left arm with shortness of breath. EXAM: CHEST - 2 VIEW COMPARISON:  CTA chest 09/03/2016 and earlier. FINDINGS: Stable lung volumes and mediastinal contours with chronic tortuosity of the thoracic aorta. Visualized tracheal air column is within normal limits. Chronic increased pulmonary interstitial markings in both lungs, stable since 2013. No pneumothorax, pleural effusion or acute pulmonary opacity. No acute osseous abnormality identified. Abdominal Calcified aortic atherosclerosis. Negative visible bowel gas pattern. IMPRESSION: 1. No acute cardiopulmonary abnormality. 2.  Aortic Atherosclerosis (ICD10-I70.0). Electronically Signed   By: Genevie Ann M.D.   On: 04/01/2020 18:00    Procedures Procedures (including critical care time)  Medications Ordered in ED Medications  sodium chloride flush (NS) 0.9 % injection 3 mL (has no administration in time range)    ED Course  I have reviewed the triage vital signs and the nursing notes.  Pertinent labs & imaging results that were available during my care of the patient were reviewed by me and considered in my medical decision making (see chart for details).    MDM Rules/Calculators/A&P                          Patient presents to the ED with complaints of neck pain primarily, has had some intermittent LUE  paresthesias as well as intermittent chest pressure as well.  Additional history obtained:  Additional history obtained from chart & nursing note review.   EKG: Some t wave inversions present, have been there some in the past.  Lab Tests:  I Ordered, reviewed, and interpreted labs, which included:  CBC: No significant anemia or leukocytosis.  BMP: No significant electrolyte derangement. Renal function preserved.  Troponins: No significant elevation, flat.  Imaging Studies ordered:  I ordered imaging studies which included CXR, I independently visualized and interpreted imaging which showed  1. No acute cardiopulmonary abnormality. 2.  Aortic Atherosclerosis   Patient is nontoxic, resting comfortably, vitals WNL. No active chest pain. Troponins are flat, low suspicion for ACS. No PE risk factors. Symmetric pulses, no tearing sensation, normtensive, no widened mediastinum on CXR- doubt dissection. Neck/head pain is reproducible with left cervical paraspinal muscle palpation. Her paresthesias are intermittent and localized to LUE therefore doubt ischemic/hemorrhagic CVA. She has no current focal neuro deficits- do not suspect cord compression. Unclear definitive etiology, however given some reproducibility with left cervical paraspinal muscles been palpated suspect this is musculoskeletal in origin, will trial Lidoderm patches as well as anti-inflammatories given her renal function is preserved.  PCP follow-up. I discussed results, treatment plan, need for follow-up, and return precautions with the patient. Provided opportunity for questions, patient confirmed understanding and is in agreement with plan.   Findings and plan of care discussed with supervising physician Dr. Ronnald Nian who has evaluated the patient & is in agreement.   Portions of this note were generated with [employer]. Dictation errors may occur despite best attempts at proofreading.  Final Clinical Impression(s) / ED  Diagnoses Final diagnoses:  Neck pain    Rx / DC Orders ED Discharge Orders         Ordered    naproxen (NAPROSYN) 375 MG tablet  2 times daily PRN     Discontinue  Reprint     04/02/20 0736    lidocaine (LIDODERM) 5 %  Daily PRN     Discontinue  Reprint     04/02/20 0736  Amaryllis Dyke, PA-C 04/02/20 [place], [place], DO 04/02/20 [phone]

## 2020-05-28 DIAGNOSIS — R079 Chest pain, unspecified: Secondary | ICD-10-CM | POA: Insufficient documentation

## 2020-05-28 NOTE — Progress Notes (Signed)
Patient referred by Lin Landsman, MD for chest pain  Subjective:   Tonya [name], female    DOB: [birth year], 74 y.o.   MRN: [id number]   Chief Complaint  Patient presents with  . Chest Pain  . New Patient (Initial Visit)    HPI  74 y.o. African American female with tobacco dependence, controlled hyperlipidemia, chest pain.  Patient was seen twice in 2 days and was continuing to be ruled in July 2021 with complaints of chest pain.  Work-up on both occasions without ACS.  PE was thought less likely.  There was no pneumonia or pneumothorax on chest x-ray.  Patient describes the chest pain as pressure, radiating to left arm worse with exertion.  After her episode of chest pain retrosternal, pressure like, radiating to left arm, worse with exertion. She continues to smoke 1 stabilizing cigarettes a day, and is willing to quit.  Past Medical History:  Diagnosis Date  . Adenoma of left adrenal gland   . Colon polyps    hyperplastic  . GERD (gastroesophageal reflux disease)   . Kidney stone   . Liver lesion   . Microhematuria      Past Surgical History:  Procedure Laterality Date  . ABDOMINAL HYSTERECTOMY    . APPENDECTOMY       Social History   Tobacco Use  Smoking Status Current Every Day Smoker  . Packs/day: 0.30  . Years: 60.00  . Pack years: 18.00  . Types: Cigarettes  Smokeless Tobacco Never Used    Social History   Substance and Sexual Activity  Alcohol Use Yes   Comment: 0.5 drink per month     Family History  Problem Relation Age of Onset  . Arrhythmia Mother        s/p Pacer   . Heart attack Father   . Colon cancer Neg Hx      Current Outpatient Medications on File Prior to Visit  Medication Sig Dispense Refill  . aspirin 81 MG tablet Take 1 tablet (81 mg total) by mouth daily. 30 tablet 3  . Calcium Carbonate-Vitamin D (CALCIUM + D PO) Take 1 tablet by mouth daily.    Marland Kitchen lidocaine (LIDODERM) 5 % Place 1 patch onto the skin  daily as needed. Apply patch to area most significant pain once per day.  Remove and discard patch within 12 hours of application. 30 patch 0  . Multiple Vitamin (MULTIVITAMIN WITH MINERALS) TABS Take 1 tablet by mouth daily.    . naproxen (NAPROSYN) 375 MG tablet Take 1 tablet (375 mg total) by mouth 2 (two) times daily as needed for moderate pain. 8 tablet 0  . omeprazole (PRILOSEC) 20 MG capsule Take 20 mg by mouth daily.    Marland Kitchen oxyCODONE-acetaminophen (PERCOCET) 7.5-325 MG per tablet Take 1 tablet by mouth every 4 (four) hours as needed.    . rosuvastatin (CRESTOR) 20 MG tablet Take 1 tablet (20 mg total) by mouth daily. 30 tablet 0   No current facility-administered medications on file prior to visit.    Cardiovascular and other pertinent studies:  EKG 05/29/2020: Sinus rhythm 69 bpm Old anteroseptal infarct Anterolateral T wave inversion, consider ischemia  Echocardiogram 2013: Left ventricle: The cavity size was normal. Wall thickness  was normal. Systolic function was normal. The estimated  ejection fraction was in the range of 60% to 65%.    Recent labs: 04/01/2020: Glucose 98, BUN/Cr 6/0.65. EGFR >60. Na/K 136/4.1.  Trop HS 3-->4 H/H 13/41. MCV 101.  Platelets 245   01/11/2020: Glucose 133, BUN/Cr 11/0.66. EGFR 103. Na/K 139/4.1. Rest of the CMP normal H/H 13.5/40.3. MCV 99.0. Platelets 228 Chol 135, TG 69, HDL 47, LDL 73 TSH 1.27 normal    Review of Systems  Cardiovascular: Positive for chest pain. Negative for dyspnea on exertion, leg swelling, palpitations and syncope.        Vitals:   05/29/20 0939  BP: 121/67  Pulse: 73  Resp: 16  SpO2: 98%     Body mass index is 23.34 kg/m. Filed Weights   05/29/20 0939  Weight: 136 lb (61.7 kg)     Objective:   Physical Exam Vitals and nursing note reviewed.  Constitutional:      General: She is not in acute distress. Neck:     Vascular: No JVD.  Cardiovascular:     Rate and Rhythm: Normal rate and  regular rhythm.     Heart sounds: Normal heart sounds. No murmur heard.   Pulmonary:     Effort: Pulmonary effort is normal.     Breath sounds: Normal breath sounds. No wheezing or rales.         Assessment & Recommendations:   74 y.o. African American female with tobacco dependence, controlled hyperlipidemia, chest pain.  Chest pain: Possibly stable angina.  ACS ruled out during ED visit in July 2021.  She has baseline T wave inversions in anterolateral leads, which are unchanged compared to previous EKG several years apart.  Recommend exercise nuclear stress test.  In absence of bleeding, okay to continue aspirin, given her history of hyperlipidemia and tobacco dependence.  I will also obtain calcium score scan, given her family h/o early CAD.   Tobacco dependence: Tobacco cessation counseling:  - Currently smoking 6 cigarettes/day   - Patient was informed of the dangers of tobacco abuse including stroke, cancer, and MI, as well as benefits of tobacco cessation. - Patient is willing to quit at this time. - Approximately 5 mins were spent counseling patient cessation techniques. We discussed various methods to help quit smoking, including deciding on a date to quit, joining a support group, pharmacological agents. Patient would like to use nicotine gum. - I will reassess her progress at the next follow-up visit   Thank you for referring the patient to [place]. Please feel free to contact with any questions.   Nigel Mormon, MD Pager: [phone] Office: [phone]

## 2020-05-29 ENCOUNTER — Other Ambulatory Visit: Payer: Self-pay

## 2020-05-29 ENCOUNTER — Ambulatory Visit: Payer: Medicare HMO | Admitting: Cardiology

## 2020-05-29 ENCOUNTER — Encounter: Payer: Self-pay | Admitting: Cardiology

## 2020-05-29 VITALS — BP 121/67 | HR 73 | Resp 16 | Ht 64.0 in | Wt 136.0 lb

## 2020-05-29 DIAGNOSIS — F17200 Nicotine dependence, unspecified, uncomplicated: Secondary | ICD-10-CM | POA: Insufficient documentation

## 2020-05-29 DIAGNOSIS — R079 Chest pain, unspecified: Secondary | ICD-10-CM

## 2020-06-05 ENCOUNTER — Other Ambulatory Visit (HOSPITAL_COMMUNITY)
Admission: RE | Admit: 2020-06-05 | Discharge: 2020-06-05 | Disposition: A | Discharge status: Home / Self Care | Payer: Medicare HMO | Source: Ambulatory Visit | Attending: Cardiology | Admitting: Cardiology

## 2020-06-05 DIAGNOSIS — Z20822 Contact with and (suspected) exposure to covid-19: Secondary | ICD-10-CM | POA: Insufficient documentation

## 2020-06-05 LAB — SARS CORONAVIRUS 2 (TAT 6-24 HRS): SARS Coronavirus 2: NEGATIVE

## 2020-06-10 ENCOUNTER — Ambulatory Visit: Payer: Medicare HMO

## 2020-06-10 ENCOUNTER — Other Ambulatory Visit: Payer: Self-pay

## 2020-06-10 ENCOUNTER — Other Ambulatory Visit: Payer: Medicare HMO

## 2020-06-10 DIAGNOSIS — R079 Chest pain, unspecified: Secondary | ICD-10-CM

## 2020-06-11 NOTE — Progress Notes (Signed)
Does she have a f/u appt w/me anytime?  Thanks MJP

## 2020-06-13 ENCOUNTER — Telehealth: Payer: Self-pay

## 2020-06-13 NOTE — Telephone Encounter (Signed)
error 

## 2020-06-14 ENCOUNTER — Encounter: Payer: Self-pay | Admitting: Cardiology

## 2020-06-14 ENCOUNTER — Ambulatory Visit: Payer: Medicare HMO | Admitting: Cardiology

## 2020-06-14 ENCOUNTER — Other Ambulatory Visit: Payer: Self-pay

## 2020-06-14 VITALS — BP 129/64 | HR 69 | Resp 16 | Ht 64.0 in | Wt 135.0 lb

## 2020-06-14 DIAGNOSIS — F17200 Nicotine dependence, unspecified, uncomplicated: Secondary | ICD-10-CM

## 2020-06-14 DIAGNOSIS — I498 Other specified cardiac arrhythmias: Secondary | ICD-10-CM

## 2020-06-14 DIAGNOSIS — R911 Solitary pulmonary nodule: Secondary | ICD-10-CM | POA: Insufficient documentation

## 2020-06-14 NOTE — Progress Notes (Signed)
Patient referred by Lin Landsman, MD for chest pain  Subjective:   Tonya [name], female    DOB: [birth year], 74 y.o.   MRN: [id number]   Chief Complaint  Patient presents with  . Chest Pain  . Results  . Follow-up    HPI  74 y.o. African American female with tobacco dependence, controlled hyperlipidemia, chest pain, ventricular bigeminy, lung nodule  Patient underwent CT cardiac scoring, and exercise nuclear stress test, details below.  She is not had any recurrent chest pain.  However, there were significant findings on the above testing.  Both stress test did not show ischemia, she had ventricular bigeminy with exercise.  CT cardiac scoring showed calcium score of 0.  However, it showed incidental finding of 1.8 cm spiculated nodule in the right upper lobe lung.  Patient has quit smoking as of 3 days ago.   Current Outpatient Medications on File Prior to Visit  Medication Sig Dispense Refill  . albuterol (VENTOLIN HFA) 108 (90 Base) MCG/ACT inhaler as needed.    . ALPRAZolam (XANAX) 1 MG tablet Take 1 tablet by mouth daily.    Marland Kitchen aspirin 81 MG tablet Take 1 tablet (81 mg total) by mouth daily. 30 tablet 3  . Calcium Carbonate-Vitamin D (CALCIUM + D PO) Take 1 tablet by mouth daily.    Marland Kitchen lidocaine (LIDODERM) 5 % Place 1 patch onto the skin daily as needed. Apply patch to area most significant pain once per day.  Remove and discard patch within 12 hours of application. 30 patch 0  . Multiple Vitamin (MULTIVITAMIN WITH MINERALS) TABS Take 1 tablet by mouth daily.    . naproxen (NAPROSYN) 375 MG tablet Take 1 tablet (375 mg total) by mouth 2 (two) times daily as needed for moderate pain. 8 tablet 0  . omeprazole (PRILOSEC) 40 MG capsule Take 20 mg by mouth 2 (two) times daily before a meal.     . oxyCODONE-acetaminophen (PERCOCET) 7.5-325 MG per tablet Take 1 tablet by mouth every 4 (four) hours as needed.    . rosuvastatin (CRESTOR) 20 MG tablet Take 1 tablet (20 mg  total) by mouth daily. 30 tablet 0   No current facility-administered medications on file prior to visit.    Cardiovascular and other pertinent studies:  CT Cardiac scoring 06/11/2020: IMPRESSION: The total coronary calcium score is 0.  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 Recommend full CT scan of the chest with contrast to better evaluate.    EKG 05/29/2020: Sinus rhythm 69 bpm Old anteroseptal infarct Anterolateral T wave inversion, consider ischemia  Echocardiogram 2013: Left ventricle: The cavity size was normal. Wall thickness  was normal. Systolic function was normal. The estimated  ejection fraction was in the range of 60% to 65%.    Recent labs: 04/01/2020: Glucose 98, BUN/Cr 6/0.65. EGFR >60. Na/K 136/4.1.  Trop HS 3-->4 H/H 13/41. MCV 101. Platelets 245   01/11/2020: Glucose 133, BUN/Cr 11/0.66. EGFR 103. Na/K 139/4.1. Rest of the CMP normal H/H 13.5/40.3. MCV 99.0. Platelets 228 Chol 135, TG 69, HDL 47, LDL 73 TSH 1.27 normal    Review of Systems  Cardiovascular: Positive for chest pain. Negative for dyspnea on exertion, leg swelling, palpitations and syncope.        Vitals:   06/14/20 1139  BP: 129/64  Pulse: 69  Resp: 16  SpO2: 97%     Body mass index  is 23.17 kg/m. Filed Weights   06/14/20 1139  Weight: 135 lb (61.2 kg)     Objective:   Physical Exam Vitals and nursing note reviewed.  Constitutional:      General: She is not in acute distress. Neck:     Vascular: No JVD.  Cardiovascular:     Rate and Rhythm: Normal rate and regular rhythm.     Heart sounds: Normal heart sounds. No murmur heard.   Pulmonary:     Effort: Pulmonary effort is normal.     Breath sounds: Normal breath sounds. No wheezing or rales.         Assessment & Recommendations:   74 y.o. African American female with tobacco dependence, controlled hyperlipidemia,  chest pain, ventricular bigeminy, lung nodule  Chest pain: Likely nonanginal. No ischemia on stress testing.  Calcium score 0.  Continue statin, as it has controlled her lipids well.  Ventricular bigeminy: Present during exercise nuclear stress test, asymptomatic. Will obtain echocardiogram to rule out structural abnormality.  No other treatment necessary at this time.  Lung nodule: Incidental finding of 1.8 cm spiculated lung nodule in RUL, concerning for primary malignancy.  I made a stat referral to [facility] pulmonology.  Patient will see Dr. Katy Fitch next week.  Congratulated on quitting smoking.  I hope she is able to sustain this.  I will see her on as-needed basis, unless echocardiogram shows any structural abnormality.   Nigel Mormon, MD Pager: [phone] Office: [phone]

## 2020-06-18 ENCOUNTER — Other Ambulatory Visit: Payer: Self-pay

## 2020-06-18 ENCOUNTER — Ambulatory Visit: Payer: Medicare HMO

## 2020-06-18 DIAGNOSIS — I498 Other specified cardiac arrhythmias: Secondary | ICD-10-CM

## 2020-06-19 ENCOUNTER — Encounter: Payer: Self-pay | Admitting: Pulmonary Disease

## 2020-06-19 ENCOUNTER — Other Ambulatory Visit: Payer: Self-pay

## 2020-06-19 ENCOUNTER — Ambulatory Visit: Payer: Medicare HMO | Admitting: Pulmonary Disease

## 2020-06-19 VITALS — BP 110/62 | HR 64 | Temp 98.0°F | Ht 64.0 in | Wt 132.8 lb

## 2020-06-19 DIAGNOSIS — Z87891 Personal history of nicotine dependence: Secondary | ICD-10-CM | POA: Diagnosis not present

## 2020-06-19 DIAGNOSIS — R911 Solitary pulmonary nodule: Secondary | ICD-10-CM | POA: Diagnosis not present

## 2020-06-19 DIAGNOSIS — J432 Centrilobular emphysema: Secondary | ICD-10-CM | POA: Diagnosis not present

## 2020-06-19 MED ORDER — STIOLTO RESPIMAT 2.5-2.5 MCG/ACT IN AERS: 2.0000 | INHALATION_SPRAY | Freq: Every day | RESPIRATORY_TRACT | 0 refills | Status: DC

## 2020-06-19 NOTE — Progress Notes (Signed)
Synopsis: Referred in Oct 2021 for lung nodule by Lin Landsman, MD  Subjective:   PATIENT ID: Tonya [name] GENDER: female DOB: [birth year], MRN: [id number]  Chief Complaint  Patient presents with  . Consult    referred for abnormal CT, RUL nodule    This is a 75 year old female, former smoker, quit last week, smoked for 60+ years at approximately 1/3 to 1/2 pack/day.  She recently went to the emergency department for evaluation for chest pain.  She was referred to cardiology.  She had a coronary calcium CT scan that was completed at [facility] imaging.  Record available for review in epic care everywhere.  This was completed on 06/11/2020.  This revealed a 1.8 cm spiculated nodule in the right upper lobe concerning for primary bronchogenic carcinoma.  I am unable to review the images today but I can review the report.  I discussed this with the patient today in the office in detail.  As for her symptoms her chest pain has since resolved.  She also had an echocardiogram completed recently with grade 1 diastolic dysfunction.  She is following with Dr. Fraser Din from [facility] cardiology.  From a respiratory standpoint she does have some dyspnea on exertion.  She is not had any previous maintenance inhalers for COPD.  She does have evidence emphysema on her coronary CT scan as per her report.  No family history of lung cancer.   Past Medical History:  Diagnosis Date  . Adenoma of left adrenal gland   . Colon polyps    hyperplastic  . GERD (gastroesophageal reflux disease)   . Kidney stone   . Liver lesion   . Microhematuria      Family History  Problem Relation Age of Onset  . Arrhythmia Mother        s/p Pacer   . Heart attack Father   . Heart disease Sister   . Colon cancer Neg Hx      Past Surgical History:  Procedure Laterality Date  . ABDOMINAL HYSTERECTOMY    . APPENDECTOMY      Social History   Socioeconomic History  . Marital status: Married    Spouse name: Not on  file  . Number of children: 3  . Years of education: Not on file  . Highest education level: Not on file  Occupational History  . Not on file  Tobacco Use  . Smoking status: Former Smoker    Packs/day: 0.30    Years: 60.00    Pack years: 18.00    Types: Cigarettes    Quit date: 06/11/2020    Years since quitting: 0.0  . Smokeless tobacco: Never Used  Vaping Use  . Vaping Use: Never used  Substance and Sexual Activity  . Alcohol use: Yes    Comment: 0.5 drink per month  . Drug use: No  . Sexual activity: Not on file  Other Topics Concern  . 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    Social Determinants of Health   Financial Resource Strain:   . Difficulty of Paying Living Expenses: Not on file  Food Insecurity:   . Worried About Charity fundraiser in the Last Year: Not on file  . Ran Out of Food in the Last Year: Not on file  Transportation Needs:   . Lack of Transportation (Medical): Not on file  . Lack of Transportation (Non-Medical): Not on file  Physical Activity:   . Days  of Exercise per Week: Not on file  . Minutes of Exercise per Session: Not on file  Stress:   . Feeling of Stress : Not on file  Social Connections:   . Frequency of Communication with Friends and Family: Not on file  . Frequency of Social Gatherings with Friends and Family: Not on file  . Attends Religious Services: Not on file  . Active Member of Clubs or Organizations: Not on file  . Attends Archivist Meetings: Not on file  . Marital Status: Not on file  Intimate Partner Violence:   . Fear of Current or Ex-Partner: Not on file  . Emotionally Abused: Not on file  . Physically Abused: Not on file  . Sexually Abused: Not on file     Allergies  Allergen Reactions  . Penicillins     REACTION: hives     Outpatient Medications Prior to Visit  Medication Sig Dispense Refill  . albuterol (VENTOLIN HFA) 108 (90 Base) MCG/ACT inhaler as needed.    .  ALPRAZolam (XANAX) 1 MG tablet Take 1 tablet by mouth daily.    Marland Kitchen aspirin 81 MG tablet Take 1 tablet (81 mg total) by mouth daily. 30 tablet 3  . lidocaine (LIDODERM) 5 % Place 1 patch onto the skin daily as needed. Apply patch to area most significant pain once per day.  Remove and discard patch within 12 hours of application. 30 patch 0  . Multiple Vitamin (MULTIVITAMIN WITH MINERALS) TABS Take 1 tablet by mouth daily.    . naproxen (NAPROSYN) 375 MG tablet Take 1 tablet (375 mg total) by mouth 2 (two) times daily as needed for moderate pain. 8 tablet 0  . omeprazole (PRILOSEC) 40 MG capsule Take 20 mg by mouth 2 (two) times daily before a meal.     . oxyCODONE-acetaminophen (PERCOCET) 7.5-325 MG per tablet Take 1 tablet by mouth every 4 (four) hours as needed.    . rosuvastatin (CRESTOR) 20 MG tablet Take 1 tablet (20 mg total) by mouth daily. 30 tablet 0  . Calcium Carbonate-Vitamin D (CALCIUM + D PO) Take 1 tablet by mouth daily. (Patient not taking: Reported on 06/19/2020)     No facility-administered medications prior to visit.    Review of Systems  Constitutional: Negative for chills, fever, malaise/fatigue and weight loss.  HENT: Negative for hearing loss, sore throat and tinnitus.   Eyes: Negative for blurred vision and double vision.  Respiratory: Positive for shortness of breath. Negative for cough, hemoptysis, sputum production, wheezing and stridor.   Cardiovascular: Negative for chest pain, palpitations, orthopnea, leg swelling and PND.  Gastrointestinal: Negative for abdominal pain, constipation, diarrhea, heartburn, nausea and vomiting.  Genitourinary: Negative for dysuria, hematuria and urgency.  Musculoskeletal: Negative for joint pain and myalgias.  Skin: Negative for itching and rash.  Neurological: Negative for dizziness, tingling, weakness and headaches.  Endo/Heme/Allergies: Negative for environmental allergies. Does not bruise/bleed easily.  Psychiatric/Behavioral:  Negative for depression. The patient is not nervous/anxious and does not have insomnia.   All other systems reviewed and are negative.    Objective:  Physical Exam Vitals reviewed.  Constitutional:      General: She is not in acute distress.    Appearance: She is well-developed.  HENT:     Head: Normocephalic and atraumatic.     Mouth/Throat:     Pharynx: No oropharyngeal exudate.  Eyes:     Conjunctiva/sclera: Conjunctivae normal.     Pupils: Pupils are equal, round, and reactive  to light.  Neck:     Vascular: No JVD.     Trachea: No tracheal deviation.     Comments: Loss of supraclavicular fat Cardiovascular:     Rate and Rhythm: Normal rate and regular rhythm.     Heart sounds: S1 normal and S2 normal.     Comments: Distant heart tones Pulmonary:     Effort: No tachypnea or accessory muscle usage.     Breath sounds: No stridor. Decreased breath sounds (throughout all lung fields) present. No wheezing, rhonchi or rales.  Abdominal:     General: Bowel sounds are normal. There is no distension.     Palpations: Abdomen is soft.     Tenderness: There is no abdominal tenderness.  Musculoskeletal:        General: No deformity.  Skin:    General: Skin is warm and dry.     Capillary Refill: Capillary refill takes less than 2 seconds.     Findings: No rash.  Neurological:     Mental Status: She is alert and oriented to person, place, and time.  Psychiatric:        Behavior: Behavior normal.      Vitals:   06/19/20 1505  BP: 110/62  Pulse: 64  Temp: 98 F (36.7 C)  TempSrc: Skin  SpO2: 97%  Weight: 132 lb 12.8 oz (60.2 kg)  Height: 5\' 4"  (1.626 m)   97% on RA BMI Readings from Last 3 Encounters:  06/19/20 22.80 kg/m  06/14/20 23.17 kg/m  05/29/20 23.34 kg/m   Wt Readings from Last 3 Encounters:  06/19/20 132 lb 12.8 oz (60.2 kg)  06/14/20 135 lb (61.2 kg)  05/29/20 136 lb (61.7 kg)     CBC    Component Value Date/Time   WBC 5.6 04/01/2020 1706    RBC 4.10 04/01/2020 1706   HGB 13.3 04/01/2020 1706   HCT 41.6 04/01/2020 1706   PLT 245 04/01/2020 1706   MCV 101.5 (H) 04/01/2020 1706   MCH 32.4 04/01/2020 1706   MCHC 32.0 04/01/2020 1706   RDW 11.9 04/01/2020 1706   LYMPHSABS 6.4 (H) 09/03/2016 1950   MONOABS 0.9 09/03/2016 1950   EOSABS 0.1 09/03/2016 1950   BASOSABS 0.0 09/03/2016 1950     Chest Imaging: 06/11/2020 coronary calcium CT scan images: Report reviewed in epic Care Everywhere A right upper lobe 1.8 cm lung nodule concerning for primary bronchogenic carcinoma. The patient's images have been independently reviewed by me.    Pulmonary Functions Testing Results: No flowsheet data found.  FeNO:   Pathology:   Echocardiogram:   Heart Catheterization:     Assessment & Plan:     ICD-10-CM   1. Right upper lobe pulmonary nodule  R91.1 CT Super D Chest Wo Contrast    NM PET Image Initial (PI) Skull Base To Thigh    Pulmonary Function Test  2. Former smoker  Z87.891   3. Centrilobular emphysema ([place])  J43.2     Discussion:  This is a 74 year old female, former smoker quit last week, 60+ years of smoking at 1/3 to 1/2 pack/day.  She had a coronary calcium CT scan with evidence of centrilobular emphysema and a right upper lobe pulmonary nodule 1.8 cm concerning for primary bronchogenic carcinoma.  Per patient's age size and location and associated smoking history carries approximately a 70% chance of malignancy per risk calculator.  Plan: We discussed all of the imaging results today in detail. Recommend a dedicated noncontrasted CT of the chest  for evaluation of the entire lung parenchyma. I have ordered a super D noncontrasted CT in case we need to plan for navigational bronchoscopy. We also discussed the risk benefits and alternatives of proceeding with tissue sampling today in the office if that was needed versus direct referral to cardiothoracic surgery for resection. If a candidate patient would like to  have resection for a lesion if possible. This can be determined with work-up. We will recommend a PET scan as well, orders have been placed She also need full pulmonary function test.  Orders have been placed.  We will need to try to coordinate getting this done ASAP so we can expedite her decision for resection. She also needs to remain smoke-free.  She says there is nothing to worry about this. As for her centrilobular emphysema and likely COPD, PFTs pending We will start Stiolto samples today to see if this helps her breathing.  She can continue use of albuterol as needed as needed  Return to clinic in 3 weeks once testing results are complete to discuss next steps   Current Outpatient Medications:  .  albuterol (VENTOLIN HFA) 108 (90 Base) MCG/ACT inhaler, as needed., Disp: , Rfl:  .  ALPRAZolam (XANAX) 1 MG tablet, Take 1 tablet by mouth daily., Disp: , Rfl:  .  aspirin 81 MG tablet, Take 1 tablet (81 mg total) by mouth daily., Disp: 30 tablet, Rfl: 3 .  lidocaine (LIDODERM) 5 %, Place 1 patch onto the skin daily as needed. Apply patch to area most significant pain once per day.  Remove and discard patch within 12 hours of application., Disp: 30 patch, Rfl: 0 .  Multiple Vitamin (MULTIVITAMIN WITH MINERALS) TABS, Take 1 tablet by mouth daily., Disp: , Rfl:  .  naproxen (NAPROSYN) 375 MG tablet, Take 1 tablet (375 mg total) by mouth 2 (two) times daily as needed for moderate pain., Disp: 8 tablet, Rfl: 0 .  omeprazole (PRILOSEC) 40 MG capsule, Take 20 mg by mouth 2 (two) times daily before a meal. , Disp: , Rfl:  .  oxyCODONE-acetaminophen (PERCOCET) 7.5-325 MG per tablet, Take 1 tablet by mouth every 4 (four) hours as needed., Disp: , Rfl:  .  rosuvastatin (CRESTOR) 20 MG tablet, Take 1 tablet (20 mg total) by mouth daily., Disp: 30 tablet, Rfl: 0 .  Calcium Carbonate-Vitamin D (CALCIUM + D PO), Take 1 tablet by mouth daily. (Patient not taking: Reported on 06/19/2020), Disp: , Rfl:   I  spent 62 minutes dedicated to the care of this patient on the date of this encounter to include pre-visit review of records, face-to-face time with the patient discussing conditions above, post visit ordering of testing, clinical documentation with the electronic health record, making appropriate referrals as documented, and communicating necessary findings to members of the patients care team.   [name], DO [facility] 06/19/2020 3:38 PM

## 2020-06-19 NOTE — Patient Instructions (Addendum)
Thank you for visiting Dr. Valeta Harms at [facility]. Today we recommend the following:  Orders Placed This Encounter  Procedures  . CT Super D Chest Wo Contrast  . NM PET Image Initial (PI) Skull Base To Thigh  . Pulmonary Function Test   Samples of stioloto inhaler  Albuterol as needed.   Return in about 3 weeks (around 07/10/2020).    Please do your part to reduce the spread of COVID-19.

## 2020-06-21 ENCOUNTER — Other Ambulatory Visit (HOSPITAL_COMMUNITY)
Admission: RE | Admit: 2020-06-21 | Discharge: 2020-06-21 | Disposition: A | Discharge status: Home / Self Care | Payer: Medicare HMO | Source: Ambulatory Visit | Attending: Pulmonary Disease | Admitting: Pulmonary Disease

## 2020-06-21 DIAGNOSIS — Z20822 Contact with and (suspected) exposure to covid-19: Secondary | ICD-10-CM | POA: Diagnosis not present

## 2020-06-21 DIAGNOSIS — Z01812 Encounter for preprocedural laboratory examination: Secondary | ICD-10-CM | POA: Insufficient documentation

## 2020-06-21 LAB — SARS CORONAVIRUS 2 (TAT 6-24 HRS): SARS Coronavirus 2: NEGATIVE

## 2020-06-24 ENCOUNTER — Other Ambulatory Visit: Payer: Self-pay

## 2020-06-24 ENCOUNTER — Ambulatory Visit (HOSPITAL_COMMUNITY)
Admission: RE | Admit: 2020-06-24 | Discharge: 2020-06-24 | Disposition: A | Discharge status: Home / Self Care | Payer: Medicare HMO | Source: Ambulatory Visit | Attending: Pulmonary Disease | Admitting: Pulmonary Disease

## 2020-06-24 DIAGNOSIS — J449 Chronic obstructive pulmonary disease, unspecified: Secondary | ICD-10-CM | POA: Insufficient documentation

## 2020-06-24 DIAGNOSIS — R911 Solitary pulmonary nodule: Secondary | ICD-10-CM | POA: Insufficient documentation

## 2020-06-24 LAB — PULMONARY FUNCTION TEST
DL/VA % pred: 85 %
DL/VA: 3.52 ml/min/mmHg/L
DLCO unc % pred: 53 %
DLCO unc: 10.29 ml/min/mmHg
FEF 25-75 Post: 1.11 L/sec
FEF 25-75 Pre: 0.63 L/sec
FEF2575-%Change-Post: 75 %
FEF2575-%Pred-Post: 68 %
FEF2575-%Pred-Pre: 39 %
FEV1-%Change-Post: 21 %
FEV1-%Pred-Post: 71 %
FEV1-%Pred-Pre: 59 %
FEV1-Post: 1.28 L
FEV1-Pre: 1.06 L
FEV1FVC-%Change-Post: 1 %
FEV1FVC-%Pred-Pre: 86 %
FEV6-%Change-Post: 17 %
FEV6-%Pred-Post: 84 %
FEV6-%Pred-Pre: 72 %
FEV6-Post: 1.87 L
FEV6-Pre: 1.59 L
FEV6FVC-%Change-Post: -1 %
FEV6FVC-%Pred-Post: 103 %
FEV6FVC-%Pred-Pre: 104 %
FVC-%Change-Post: 18 %
FVC-%Pred-Post: 82 %
FVC-%Pred-Pre: 69 %
FVC-Post: 1.89 L
FVC-Pre: 1.59 L
Post FEV1/FVC ratio: 68 %
Post FEV6/FVC ratio: 99 %
Pre FEV1/FVC ratio: 67 %
Pre FEV6/FVC Ratio: 100 %
RV % pred: 142 %
RV: 3.18 L
TLC % pred: 94 %
TLC: 4.78 L

## 2020-06-24 MED ORDER — ALBUTEROL SULFATE (2.5 MG/3ML) 0.083% IN NEBU
2.5000 mg | INHALATION_SOLUTION | Freq: Once | RESPIRATORY_TRACT | Status: AC
  Administered 2020-06-24: 2.5 mg via RESPIRATORY_TRACT

## 2020-06-25 ENCOUNTER — Ambulatory Visit (HOSPITAL_COMMUNITY): Payer: Medicare HMO

## 2020-06-25 ENCOUNTER — Telehealth: Payer: Self-pay | Admitting: Pulmonary Disease

## 2020-06-25 NOTE — Telephone Encounter (Signed)
Spoke to [employer] is approved and ct is pending they will notify us as soon as they get it finished and I spoke to pt and she is aware of this Tonya [name]

## 2020-06-26 NOTE — Telephone Encounter (Signed)
Tonya [name] has been received PET [id number] same auth# for the CHEST CT pt is aware Tonya [name]

## 2020-07-03 ENCOUNTER — Telehealth: Payer: Self-pay | Admitting: Pulmonary Disease

## 2020-07-03 ENCOUNTER — Ambulatory Visit (HOSPITAL_COMMUNITY)
Admission: RE | Admit: 2020-07-03 | Discharge: 2020-07-03 | Disposition: A | Discharge status: Home / Self Care | Payer: Medicare HMO | Source: Ambulatory Visit | Attending: Pulmonary Disease | Admitting: Pulmonary Disease

## 2020-07-03 ENCOUNTER — Encounter (HOSPITAL_COMMUNITY): Payer: Self-pay

## 2020-07-03 ENCOUNTER — Encounter (HOSPITAL_COMMUNITY)
Admission: RE | Admit: 2020-07-03 | Discharge: 2020-07-03 | Disposition: A | Discharge status: Home / Self Care | Payer: Medicare HMO | Source: Ambulatory Visit | Attending: Pulmonary Disease | Admitting: Pulmonary Disease

## 2020-07-03 ENCOUNTER — Other Ambulatory Visit: Payer: Self-pay

## 2020-07-03 DIAGNOSIS — K8020 Calculus of gallbladder without cholecystitis without obstruction: Secondary | ICD-10-CM | POA: Diagnosis not present

## 2020-07-03 DIAGNOSIS — R911 Solitary pulmonary nodule: Secondary | ICD-10-CM

## 2020-07-03 DIAGNOSIS — N200 Calculus of kidney: Secondary | ICD-10-CM | POA: Diagnosis not present

## 2020-07-03 DIAGNOSIS — J439 Emphysema, unspecified: Secondary | ICD-10-CM | POA: Diagnosis not present

## 2020-07-03 DIAGNOSIS — I700 Atherosclerosis of aorta: Secondary | ICD-10-CM | POA: Diagnosis not present

## 2020-07-03 LAB — GLUCOSE, CAPILLARY: Glucose-Capillary: 91 mg/dL (ref 70–99)

## 2020-07-03 MED ORDER — FLUDEOXYGLUCOSE F - 18 (FDG) INJECTION
6.6000 | Freq: Once | INTRAVENOUS | Status: AC
  Administered 2020-07-03: 6.6 via INTRAVENOUS

## 2020-07-03 NOTE — Telephone Encounter (Signed)
Thank you. She has an appt with me next week to discuss next steps.  [name], DO [place] 07/03/2020 12:26 PM

## 2020-07-03 NOTE — Telephone Encounter (Signed)
[facility] and spoke with Malachy Mood in regards to the results of pt's CT Super D Chest.  IMPRESSION: 1. Enlarging spiculated solid nodule in the left upper lobe and enlarging part solid nodules in the right upper lobe, suspicious for malignancy. Referral to thoracic multi disciplinary clinic for further evaluation recommended. PET-CT may be helpful for further characterization of these nodules. 2. No evidence of metastatic disease. 3. Aortic Atherosclerosis (ICD10-I70.0) and Emphysema (ICD10-J43.9). 4. These results will be called to the ordering clinician or representative by the Radiologist Assistant, and communication documented in the PACS or [employer].  Stated to Henrietta that I was able to see the results from the impression and also stated to her that we were able to see that pt was currently having PET performed and she verbalized understanding. Routing this to Dr. Valeta Harms as an Juluis Rainier as it was a call report.

## 2020-07-03 NOTE — Telephone Encounter (Signed)
Noted. Nothing further needed. 

## 2020-07-11 ENCOUNTER — Encounter: Payer: Self-pay | Admitting: Pulmonary Disease

## 2020-07-11 ENCOUNTER — Other Ambulatory Visit: Payer: Self-pay

## 2020-07-11 ENCOUNTER — Ambulatory Visit: Payer: Medicare HMO | Admitting: Pulmonary Disease

## 2020-07-11 ENCOUNTER — Telehealth: Payer: Self-pay | Admitting: Pulmonary Disease

## 2020-07-11 VITALS — BP 104/60 | HR 54 | Wt 135.5 lb

## 2020-07-11 DIAGNOSIS — Z87891 Personal history of nicotine dependence: Secondary | ICD-10-CM | POA: Diagnosis not present

## 2020-07-11 DIAGNOSIS — R911 Solitary pulmonary nodule: Secondary | ICD-10-CM | POA: Diagnosis not present

## 2020-07-11 DIAGNOSIS — R942 Abnormal results of pulmonary function studies: Secondary | ICD-10-CM | POA: Diagnosis not present

## 2020-07-11 DIAGNOSIS — R918 Other nonspecific abnormal finding of lung field: Secondary | ICD-10-CM | POA: Diagnosis not present

## 2020-07-11 LAB — BASIC METABOLIC PANEL
BUN: 10 mg/dL (ref 6–23)
CO2: 27 mEq/L (ref 19–32)
Calcium: 9.3 mg/dL (ref 8.4–10.5)
Chloride: 106 mEq/L (ref 96–112)
Creatinine, Ser: 0.68 mg/dL (ref 0.40–1.20)
GFR: 87.14 mL/min (ref >60.00)
Glucose, Bld: 77 mg/dL (ref 70–99)
Potassium: 3.9 mEq/L (ref 3.5–5.1)
Sodium: 141 mEq/L (ref 135–145)

## 2020-07-11 LAB — CBC
HCT: 38 % (ref 36.0–46.0)
Hemoglobin: 12.6 g/dL (ref 12.0–15.0)
MCHC: 33.2 g/dL (ref 30.0–36.0)
MCV: 98.7 fl (ref 78.0–100.0)
Platelets: 200 10*3/uL (ref 150.0–400.0)
RBC: 3.85 Mil/uL — ABNORMAL LOW (ref 3.87–5.11)
RDW: 13.1 % (ref 11.5–15.5)
WBC: 4.9 10*3/uL (ref 4.0–10.5)

## 2020-07-11 NOTE — Patient Instructions (Addendum)
Thank you for visiting Dr. Valeta Harms at [facility]. Today we recommend the following:  Orders Placed This Encounter  Procedures  . CBC  . Basic Metabolic Panel (BMET)  . Ambulatory referral to Pulmonology   Please expect a call from pre-operative services.  Planning for your bronchoscopy on 07/18/2020  Return in about 6 weeks (around 08/22/2020) for Dr. Valeta Harms .    Please do your part to reduce the spread of COVID-19.

## 2020-07-11 NOTE — H&P (View-Only) (Signed)
Synopsis: Referred in Oct 2021 for lung nodule by Lin Landsman, MD  Subjective:   PATIENT ID: Tonya [name] GENDER: female DOB: 1946-03-01, MRN: [id number]  Chief Complaint  Patient presents with   Follow-up    follow up after CT and PET scan     This is a 74 year old female, former smoker, quit last week, smoked for 60+ years at approximately 1/3 to 1/2 pack/day.  She recently went to the emergency department for evaluation for chest pain.  She was referred to cardiology.  She had a coronary calcium CT scan that was completed at [facility] imaging.  Record available for review in epic care everywhere.  This was completed on 06/11/2020.  This revealed a 1.8 cm spiculated nodule in the right upper lobe concerning for primary bronchogenic carcinoma.  I am unable to review the images today but I can review the report.  I discussed this with the patient today in the office in detail.  As for her symptoms her chest pain has since resolved.  She also had an echocardiogram completed recently with grade 1 diastolic dysfunction.  She is following with Dr. Fraser Din from [facility] cardiology.  From a respiratory standpoint she does have some dyspnea on exertion.  She is not had any previous maintenance inhalers for COPD.  She does have evidence emphysema on her coronary CT scan as per her report.  No family history of lung cancer..  OV 07/11/2020: Patient here today for follow-up after recent nuclear medicine pet imaging.  As well as super D CT imaging.  Patient found to have a new left upper lobe lesion as well as a right upper lobe lesion.  The new left upper lobe lesion was found in this process of work-up.  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  We discussed this today in the office.  Patient is very anxious about all of this new information.  Denies fevers night sweats weight loss.   Past Medical History:  Diagnosis Date   Adenoma of left adrenal gland    Colon polyps    hyperplastic    GERD (gastroesophageal reflux disease)    Kidney stone    Liver lesion    Microhematuria      Family History  Problem Relation Age of Onset   Arrhythmia Mother        s/p Pacer    Heart attack Father    Heart disease Sister    Colon cancer Neg Hx      Past Surgical History:  Procedure Laterality Date   ABDOMINAL HYSTERECTOMY     APPENDECTOMY      Social History   Socioeconomic History   Marital status: Married    Spouse name: Not on file   Number of children: 3   Years of education: Not on file   Highest education level: Not on file  Occupational History   Not on file  Tobacco Use   Smoking status: Former Smoker    Packs/day: 0.30    Years: 60.00    Pack years: 18.00    Types: Cigarettes    Quit date: 06/11/2020    Years since quitting: 0.0   Smokeless tobacco: Never Used  Vaping Use   Vaping Use: Never used  Substance and Sexual Activity   Alcohol use: Yes    Comment: 0.5 drink per month   Drug use: No   Sexual activity: Not on file  Other Topics Concern   Not on file  Social History Narrative  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    Social Determinants of Health   Financial Resource Strain:    Difficulty of Paying Living Expenses: Not on file  Food Insecurity:    Worried About Charity fundraiser in the Last Year: Not on file   [employer] of Food in the Last Year: Not on file  Transportation Needs:    Lack of Transportation (Medical): Not on file   Lack of Transportation (Non-Medical): Not on file  Physical Activity:    Days of Exercise per Week: Not on file   Minutes of Exercise per Session: Not on file  Stress:    Feeling of Stress : Not on file  Social Connections:    Frequency of Communication with Friends and Family: Not on file   Frequency of Social Gatherings with Friends and Family: Not on file   Attends Religious Services: Not on file   Active Member of Clubs or Organizations: Not on file   Attends Theatre manager Meetings: Not on file   Marital Status: Not on file  Intimate Partner Violence:    Fear of Current or Ex-Partner: Not on file   Emotionally Abused: Not on file   Physically Abused: Not on file   Sexually Abused: Not on file     Allergies  Allergen Reactions   Penicillins     REACTION: hives     Outpatient Medications Prior to Visit  Medication Sig Dispense Refill   albuterol (VENTOLIN HFA) 108 (90 Base) MCG/ACT inhaler as needed.     alendronate (FOSAMAX) 70 MG tablet      ALPRAZolam (XANAX) 1 MG tablet Take 1 tablet by mouth daily.     aspirin 81 MG tablet Take 1 tablet (81 mg total) by mouth daily. 30 tablet 3   Calcium Carbonate-Vitamin D (CALCIUM + D PO) Take 1 tablet by mouth daily.      cyclobenzaprine (FLEXERIL) 5 MG tablet      lidocaine (LIDODERM) 5 % Place 1 patch onto the skin daily as needed. Apply patch to area most significant pain once per day.  Remove and discard patch within 12 hours of application. 30 patch 0   Multiple Vitamin (MULTIVITAMIN WITH MINERALS) TABS Take 1 tablet by mouth daily.     naproxen (NAPROSYN) 375 MG tablet Take 1 tablet (375 mg total) by mouth 2 (two) times daily as needed for moderate pain. 8 tablet 0   omeprazole (PRILOSEC) 40 MG capsule Take 20 mg by mouth 2 (two) times daily before a meal.      oxyCODONE-acetaminophen (PERCOCET) 7.5-325 MG per tablet Take 1 tablet by mouth every 4 (four) hours as needed.     rosuvastatin (CRESTOR) 20 MG tablet Take 1 tablet (20 mg total) by mouth daily. 30 tablet 0   Tiotropium Bromide-Olodaterol (STIOLTO RESPIMAT) 2.5-2.5 MCG/ACT AERS Inhale 2 puffs into the lungs daily. 4 g 0   No facility-administered medications prior to visit.    Review of Systems  Constitutional: Negative for chills, fever, malaise/fatigue and weight loss.  HENT: Negative for hearing loss, sore throat and tinnitus.   Eyes: Negative for blurred vision and double vision.  Respiratory: Negative for  cough, hemoptysis, sputum production, shortness of breath, wheezing and stridor.   Cardiovascular: Negative for chest pain, palpitations, orthopnea, leg swelling and PND.  Gastrointestinal: Negative for abdominal pain, constipation, diarrhea, heartburn, nausea and vomiting.  Genitourinary: Negative for dysuria, hematuria and urgency.  Musculoskeletal: Negative for joint pain and myalgias.  Skin:  Negative for itching and rash.  Neurological: Negative for dizziness, tingling, weakness and headaches.  Endo/Heme/Allergies: Negative for environmental allergies. Does not bruise/bleed easily.  Psychiatric/Behavioral: Negative for depression. The patient is not nervous/anxious and does not have insomnia.   All other systems reviewed and are negative.    Objective:  Physical Exam Vitals reviewed.  Constitutional:      General: She is not in acute distress.    Appearance: She is well-developed.  HENT:     Head: Normocephalic and atraumatic.  Eyes:     General: No scleral icterus.    Conjunctiva/sclera: Conjunctivae normal.     Pupils: Pupils are equal, round, and reactive to light.  Neck:     Vascular: No JVD.     Trachea: No tracheal deviation.  Cardiovascular:     Rate and Rhythm: Normal rate and regular rhythm.     Heart sounds: Normal heart sounds. No murmur heard.   Pulmonary:     Effort: Pulmonary effort is normal. No tachypnea, accessory muscle usage or respiratory distress.     Breath sounds: No stridor. No wheezing, rhonchi or rales.  Abdominal:     General: Bowel sounds are normal. There is no distension.     Palpations: Abdomen is soft.     Tenderness: There is no abdominal tenderness.  Musculoskeletal:        General: No tenderness.     Cervical back: Neck supple.  Lymphadenopathy:     Cervical: No cervical adenopathy.  Skin:    General: Skin is warm and dry.     Capillary Refill: Capillary refill takes less than 2 seconds.     Findings: No rash.  Neurological:      Mental Status: She is alert and oriented to person, place, and time.  Psychiatric:        Behavior: Behavior normal.      Vitals:   07/11/20 0910  BP: 104/60  Pulse: (!) 54  SpO2: 99%  Weight: 135 lb 8 oz (61.5 kg)   99% on RA BMI Readings from Last 3 Encounters:  07/11/20 23.26 kg/m  06/19/20 22.80 kg/m  06/14/20 23.17 kg/m   Wt Readings from Last 3 Encounters:  07/11/20 135 lb 8 oz (61.5 kg)  06/19/20 132 lb 12.8 oz (60.2 kg)  06/14/20 135 lb (61.2 kg)     CBC    Component Value Date/Time   WBC 5.6 04/01/2020 1706   RBC 4.10 04/01/2020 1706   HGB 13.3 04/01/2020 1706   HCT 41.6 04/01/2020 1706   PLT 245 04/01/2020 1706   MCV 101.5 (H) 04/01/2020 1706   MCH 32.4 04/01/2020 1706   MCHC 32.0 04/01/2020 1706   RDW 11.9 04/01/2020 1706   LYMPHSABS 6.4 (H) 09/03/2016 1950   MONOABS 0.9 09/03/2016 1950   EOSABS 0.1 09/03/2016 1950   BASOSABS 0.0 09/03/2016 1950     Chest Imaging: 06/11/2020 coronary calcium CT scan images: Report reviewed in epic Care Everywhere A right upper lobe 1.8 cm lung nodule concerning for primary bronchogenic carcinoma. The patient's images have been independently reviewed by me.    Pulmonary Functions Testing Results: PFT Results Latest Ref Rng & Units 06/24/2020  FVC-Pre L 1.59  FVC-Predicted Pre % 69  FVC-Post L 1.89  FVC-Predicted Post % 82  Pre FEV1/FVC % % 67  Post FEV1/FCV % % 68  FEV1-Pre L 1.06  FEV1-Predicted Pre % 59  FEV1-Post L 1.28  DLCO uncorrected ml/min/mmHg 10.29  DLCO UNC% % 53  DLVA  Predicted % 85  TLC L 4.78  TLC % Predicted % 94  RV % Predicted % 142    FeNO:   Pathology:   Echocardiogram:   Heart Catheterization:     Assessment & Plan:     ICD-10-CM   1. Multiple lung nodules  R91.8 Ambulatory referral to Pulmonology    CBC    Basic Metabolic Panel (BMET)    Basic Metabolic Panel (BMET)    CBC  2. Right upper lobe pulmonary nodule  R91.1   3. Nodule of upper lobe of left lung  R91.1     4. Abnormal PET scan of lung  R94.2   5. Former smoker  Z87.891     Discussion:  This is a 74 year old female, former smoker, quit at the beginning of the month, 60+ pack year history.  She had a initial coronary calcium CT which revealed centrilobular emphysema and a right upper lobe pulmonary nodule that was 1.8 cm in size concerning for primary bronchogenic carcinoma.  She underwent additional work-up to include nuclear medicine pet imaging which revealed an additional new left upper lobe pulmonary nodule.  Both locations were PET avid on recent nuclear medicine pet imaging.  The nuclear medicine pet imaging revealed a SUV of 2.7 in the 0.8 cm left upper lobe nodule and an SUV of 3.3 in the right upper lobe nodule.  Plan: In addition she had a super D CT already completed. We discussed risk benefits and alternatives of proceeding with navigational bronchoscopy and tissue sampling since she has 2 separate locations. We discussed alternatives to include percutaneous needle biopsy. Patient is agreeable to proceed with scheduling of navigational bronchoscopy. We will also plan for fiducial placement. She may be a candidate for consider of wedge resection or lobectomy on 1 side versus radiation for the other. I will reach out to one of our thoracic surgery colleagues to discuss. In the meantime we will tentatively plan for her bronchoscopy to be completed on 07/18/2020.  Case discussed via phone with Dr. Kipp Brood today from thoracic surgery.    Current Outpatient Medications:    albuterol (VENTOLIN HFA) 108 (90 Base) MCG/ACT inhaler, as needed., Disp: , Rfl:    alendronate (FOSAMAX) 70 MG tablet, , Disp: , Rfl:    ALPRAZolam (XANAX) 1 MG tablet, Take 1 tablet by mouth daily., Disp: , Rfl:    aspirin 81 MG tablet, Take 1 tablet (81 mg total) by mouth daily., Disp: 30 tablet, Rfl: 3   Calcium Carbonate-Vitamin D (CALCIUM + D PO), Take 1 tablet by mouth daily. , Disp: , Rfl:     cyclobenzaprine (FLEXERIL) 5 MG tablet, , Disp: , Rfl:    lidocaine (LIDODERM) 5 %, Place 1 patch onto the skin daily as needed. Apply patch to area most significant pain once per day.  Remove and discard patch within 12 hours of application., Disp: 30 patch, Rfl: 0   Multiple Vitamin (MULTIVITAMIN WITH MINERALS) TABS, Take 1 tablet by mouth daily., Disp: , Rfl:    naproxen (NAPROSYN) 375 MG tablet, Take 1 tablet (375 mg total) by mouth 2 (two) times daily as needed for moderate pain., Disp: 8 tablet, Rfl: 0   omeprazole (PRILOSEC) 40 MG capsule, Take 20 mg by mouth 2 (two) times daily before a meal. , Disp: , Rfl:    oxyCODONE-acetaminophen (PERCOCET) 7.5-325 MG per tablet, Take 1 tablet by mouth every 4 (four) hours as needed., Disp: , Rfl:    rosuvastatin (CRESTOR) 20 MG tablet, Take  1 tablet (20 mg total) by mouth daily., Disp: 30 tablet, Rfl: 0   Tiotropium Bromide-Olodaterol (STIOLTO RESPIMAT) 2.5-2.5 MCG/ACT AERS, Inhale 2 puffs into the lungs daily., Disp: 4 g, Rfl: 0  I spent 30 minutes dedicated to the care of this patient on the date of this encounter to include pre-visit review of records, face-to-face time with the patient discussing conditions above, post visit ordering of testing, clinical documentation with the electronic health record, making appropriate referrals as documented, and communicating necessary findings to members of the patients care team.    [name], DO [facility] 07/11/2020 9:25 AM

## 2020-07-11 NOTE — Progress Notes (Signed)
Synopsis: Referred in Oct 2021 for lung nodule by Lin Landsman, MD  Subjective:   PATIENT ID: Tonya [name] GENDER: female DOB: 1946-02-22, MRN: [id number]  Chief Complaint  Patient presents with  . Follow-up    follow up after CT and PET scan     This is a 74 year old female, former smoker, quit last week, smoked for 60+ years at approximately 1/3 to 1/2 pack/day.  She recently went to the emergency department for evaluation for chest pain.  She was referred to cardiology.  She had a coronary calcium CT scan that was completed at [facility] imaging.  Record available for review in epic care everywhere.  This was completed on 06/11/2020.  This revealed a 1.8 cm spiculated nodule in the right upper lobe concerning for primary bronchogenic carcinoma.  I am unable to review the images today but I can review the report.  I discussed this with the patient today in the office in detail.  As for her symptoms her chest pain has since resolved.  She also had an echocardiogram completed recently with grade 1 diastolic dysfunction.  She is following with Dr. Fraser Din from [facility] cardiology.  From a respiratory standpoint she does have some dyspnea on exertion.  She is not had any previous maintenance inhalers for COPD.  She does have evidence emphysema on her coronary CT scan as per her report.  No family history of lung cancer..  OV 07/11/2020: Patient here today for follow-up after recent nuclear medicine pet imaging.  As well as super D CT imaging.  Patient found to have a new left upper lobe lesion as well as a right upper lobe lesion.  The new left upper lobe lesion was found in this process of work-up.  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  We discussed this today in the office.  Patient is very anxious about all of this new information.  Denies fevers night sweats weight loss.   Past Medical History:  Diagnosis Date  . Adenoma of left adrenal gland   . Colon polyps    hyperplastic  .  GERD (gastroesophageal reflux disease)   . Kidney stone   . Liver lesion   . Microhematuria      Family History  Problem Relation Age of Onset  . Arrhythmia Mother        s/p Pacer   . Heart attack Father   . Heart disease Sister   . Colon cancer Neg Hx      Past Surgical History:  Procedure Laterality Date  . ABDOMINAL HYSTERECTOMY    . APPENDECTOMY      Social History   Socioeconomic History  . Marital status: Married    Spouse name: Not on file  . Number of children: 3  . Years of education: Not on file  . Highest education level: Not on file  Occupational History  . Not on file  Tobacco Use  . Smoking status: Former Smoker    Packs/day: 0.30    Years: 60.00    Pack years: 18.00    Types: Cigarettes    Quit date: 06/11/2020    Years since quitting: 0.0  . Smokeless tobacco: Never Used  Vaping Use  . Vaping Use: Never used  Substance and Sexual Activity  . Alcohol use: Yes    Comment: 0.5 drink per month  . Drug use: No  . Sexual activity: Not on file  Other Topics Concern  . 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    Social Determinants of Health   Financial Resource Strain:   . Difficulty of Paying Living Expenses: Not on file  Food Insecurity:   . Worried About Charity fundraiser in the Last Year: Not on file  . Ran Out of Food in the Last Year: Not on file  Transportation Needs:   . Lack of Transportation (Medical): Not on file  . Lack of Transportation (Non-Medical): Not on file  Physical Activity:   . Days of Exercise per Week: Not on file  . Minutes of Exercise per Session: Not on file  Stress:   . Feeling of Stress : Not on file  Social Connections:   . Frequency of Communication with Friends and Family: Not on file  . Frequency of Social Gatherings with Friends and Family: Not on file  . Attends Religious Services: Not on file  . Active Member of Clubs or Organizations: Not on file  . Attends Theatre manager Meetings: Not on file  . Marital Status: Not on file  Intimate Partner Violence:   . Fear of Current or Ex-Partner: Not on file  . Emotionally Abused: Not on file  . Physically Abused: Not on file  . Sexually Abused: Not on file     Allergies  Allergen Reactions  . Penicillins     REACTION: hives     Outpatient Medications Prior to Visit  Medication Sig Dispense Refill  . albuterol (VENTOLIN HFA) 108 (90 Base) MCG/ACT inhaler as needed.    Marland Kitchen alendronate (FOSAMAX) 70 MG tablet     . ALPRAZolam (XANAX) 1 MG tablet Take 1 tablet by mouth daily.    Marland Kitchen aspirin 81 MG tablet Take 1 tablet (81 mg total) by mouth daily. 30 tablet 3  . Calcium Carbonate-Vitamin D (CALCIUM + D PO) Take 1 tablet by mouth daily.     . cyclobenzaprine (FLEXERIL) 5 MG tablet     . lidocaine (LIDODERM) 5 % Place 1 patch onto the skin daily as needed. Apply patch to area most significant pain once per day.  Remove and discard patch within 12 hours of application. 30 patch 0  . Multiple Vitamin (MULTIVITAMIN WITH MINERALS) TABS Take 1 tablet by mouth daily.    . naproxen (NAPROSYN) 375 MG tablet Take 1 tablet (375 mg total) by mouth 2 (two) times daily as needed for moderate pain. 8 tablet 0  . omeprazole (PRILOSEC) 40 MG capsule Take 20 mg by mouth 2 (two) times daily before a meal.     . oxyCODONE-acetaminophen (PERCOCET) 7.5-325 MG per tablet Take 1 tablet by mouth every 4 (four) hours as needed.    . rosuvastatin (CRESTOR) 20 MG tablet Take 1 tablet (20 mg total) by mouth daily. 30 tablet 0  . Tiotropium Bromide-Olodaterol (STIOLTO RESPIMAT) 2.5-2.5 MCG/ACT AERS Inhale 2 puffs into the lungs daily. 4 g 0   No facility-administered medications prior to visit.    Review of Systems  Constitutional: Negative for chills, fever, malaise/fatigue and weight loss.  HENT: Negative for hearing loss, sore throat and tinnitus.   Eyes: Negative for blurred vision and double vision.  Respiratory: Negative for  cough, hemoptysis, sputum production, shortness of breath, wheezing and stridor.   Cardiovascular: Negative for chest pain, palpitations, orthopnea, leg swelling and PND.  Gastrointestinal: Negative for abdominal pain, constipation, diarrhea, heartburn, nausea and vomiting.  Genitourinary: Negative for dysuria, hematuria and urgency.  Musculoskeletal: Negative for joint pain and myalgias.  Skin:  Negative for itching and rash.  Neurological: Negative for dizziness, tingling, weakness and headaches.  Endo/Heme/Allergies: Negative for environmental allergies. Does not bruise/bleed easily.  Psychiatric/Behavioral: Negative for depression. The patient is not nervous/anxious and does not have insomnia.   All other systems reviewed and are negative.    Objective:  Physical Exam Vitals reviewed.  Constitutional:      General: She is not in acute distress.    Appearance: She is well-developed.  HENT:     Head: Normocephalic and atraumatic.  Eyes:     General: No scleral icterus.    Conjunctiva/sclera: Conjunctivae normal.     Pupils: Pupils are equal, round, and reactive to light.  Neck:     Vascular: No JVD.     Trachea: No tracheal deviation.  Cardiovascular:     Rate and Rhythm: Normal rate and regular rhythm.     Heart sounds: Normal heart sounds. No murmur heard.   Pulmonary:     Effort: Pulmonary effort is normal. No tachypnea, accessory muscle usage or respiratory distress.     Breath sounds: No stridor. No wheezing, rhonchi or rales.  Abdominal:     General: Bowel sounds are normal. There is no distension.     Palpations: Abdomen is soft.     Tenderness: There is no abdominal tenderness.  Musculoskeletal:        General: No tenderness.     Cervical back: Neck supple.  Lymphadenopathy:     Cervical: No cervical adenopathy.  Skin:    General: Skin is warm and dry.     Capillary Refill: Capillary refill takes less than 2 seconds.     Findings: No rash.  Neurological:      Mental Status: She is alert and oriented to person, place, and time.  Psychiatric:        Behavior: Behavior normal.      Vitals:   07/11/20 0910  BP: 104/60  Pulse: (!) 54  SpO2: 99%  Weight: 135 lb 8 oz (61.5 kg)   99% on RA BMI Readings from Last 3 Encounters:  07/11/20 23.26 kg/m  06/19/20 22.80 kg/m  06/14/20 23.17 kg/m   Wt Readings from Last 3 Encounters:  07/11/20 135 lb 8 oz (61.5 kg)  06/19/20 132 lb 12.8 oz (60.2 kg)  06/14/20 135 lb (61.2 kg)     CBC    Component Value Date/Time   WBC 5.6 04/01/2020 1706   RBC 4.10 04/01/2020 1706   HGB 13.3 04/01/2020 1706   HCT 41.6 04/01/2020 1706   PLT 245 04/01/2020 1706   MCV 101.5 (H) 04/01/2020 1706   MCH 32.4 04/01/2020 1706   MCHC 32.0 04/01/2020 1706   RDW 11.9 04/01/2020 1706   LYMPHSABS 6.4 (H) 09/03/2016 1950   MONOABS 0.9 09/03/2016 1950   EOSABS 0.1 09/03/2016 1950   BASOSABS 0.0 09/03/2016 1950     Chest Imaging: 06/11/2020 coronary calcium CT scan images: Report reviewed in epic Care Everywhere A right upper lobe 1.8 cm lung nodule concerning for primary bronchogenic carcinoma. The patient's images have been independently reviewed by me.    Pulmonary Functions Testing Results: PFT Results Latest Ref Rng & Units 06/24/2020  FVC-Pre L 1.59  FVC-Predicted Pre % 69  FVC-Post L 1.89  FVC-Predicted Post % 82  Pre FEV1/FVC % % 67  Post FEV1/FCV % % 68  FEV1-Pre L 1.06  FEV1-Predicted Pre % 59  FEV1-Post L 1.28  DLCO uncorrected ml/min/mmHg 10.29  DLCO UNC% % 53  DLVA  Predicted % 85  TLC L 4.78  TLC % Predicted % 94  RV % Predicted % 142    FeNO:   Pathology:   Echocardiogram:   Heart Catheterization:     Assessment & Plan:     ICD-10-CM   1. Multiple lung nodules  R91.8 Ambulatory referral to Pulmonology    CBC    Basic Metabolic Panel (BMET)    Basic Metabolic Panel (BMET)    CBC  2. Right upper lobe pulmonary nodule  R91.1   3. Nodule of upper lobe of left lung  R91.1    4. Abnormal PET scan of lung  R94.2   5. Former smoker  Z87.891     Discussion:  This is a 74 year old female, former smoker, quit at the beginning of the month, 60+ pack year history.  She had a initial coronary calcium CT which revealed centrilobular emphysema and a right upper lobe pulmonary nodule that was 1.8 cm in size concerning for primary bronchogenic carcinoma.  She underwent additional work-up to include nuclear medicine pet imaging which revealed an additional new left upper lobe pulmonary nodule.  Both locations were PET avid on recent nuclear medicine pet imaging.  The nuclear medicine pet imaging revealed a SUV of 2.7 in the 0.8 cm left upper lobe nodule and an SUV of 3.3 in the right upper lobe nodule.  Plan: In addition she had a super D CT already completed. We discussed risk benefits and alternatives of proceeding with navigational bronchoscopy and tissue sampling since she has 2 separate locations. We discussed alternatives to include percutaneous needle biopsy. Patient is agreeable to proceed with scheduling of navigational bronchoscopy. We will also plan for fiducial placement. She may be a candidate for consider of wedge resection or lobectomy on 1 side versus radiation for the other. I will reach out to one of our thoracic surgery colleagues to discuss. In the meantime we will tentatively plan for her bronchoscopy to be completed on 07/18/2020.  Case discussed via phone with Dr. Kipp Brood today from thoracic surgery.    Current Outpatient Medications:  .  albuterol (VENTOLIN HFA) 108 (90 Base) MCG/ACT inhaler, as needed., Disp: , Rfl:  .  alendronate (FOSAMAX) 70 MG tablet, , Disp: , Rfl:  .  ALPRAZolam (XANAX) 1 MG tablet, Take 1 tablet by mouth daily., Disp: , Rfl:  .  aspirin 81 MG tablet, Take 1 tablet (81 mg total) by mouth daily., Disp: 30 tablet, Rfl: 3 .  Calcium Carbonate-Vitamin D (CALCIUM + D PO), Take 1 tablet by mouth daily. , Disp: , Rfl:  .   cyclobenzaprine (FLEXERIL) 5 MG tablet, , Disp: , Rfl:  .  lidocaine (LIDODERM) 5 %, Place 1 patch onto the skin daily as needed. Apply patch to area most significant pain once per day.  Remove and discard patch within 12 hours of application., Disp: 30 patch, Rfl: 0 .  Multiple Vitamin (MULTIVITAMIN WITH MINERALS) TABS, Take 1 tablet by mouth daily., Disp: , Rfl:  .  naproxen (NAPROSYN) 375 MG tablet, Take 1 tablet (375 mg total) by mouth 2 (two) times daily as needed for moderate pain., Disp: 8 tablet, Rfl: 0 .  omeprazole (PRILOSEC) 40 MG capsule, Take 20 mg by mouth 2 (two) times daily before a meal. , Disp: , Rfl:  .  oxyCODONE-acetaminophen (PERCOCET) 7.5-325 MG per tablet, Take 1 tablet by mouth every 4 (four) hours as needed., Disp: , Rfl:  .  rosuvastatin (CRESTOR) 20 MG tablet, Take 1  tablet (20 mg total) by mouth daily., Disp: 30 tablet, Rfl: 0 .  Tiotropium Bromide-Olodaterol (STIOLTO RESPIMAT) 2.5-2.5 MCG/ACT AERS, Inhale 2 puffs into the lungs daily., Disp: 4 g, Rfl: 0  I spent 30 minutes dedicated to the care of this patient on the date of this encounter to include pre-visit review of records, face-to-face time with the patient discussing conditions above, post visit ordering of testing, clinical documentation with the electronic health record, making appropriate referrals as documented, and communicating necessary findings to members of the patients care team.    [name], DO [facility] 07/11/2020 9:25 AM

## 2020-07-11 NOTE — Telephone Encounter (Signed)
Pt has been scheduled for ENB on 11/11at 11:20 at [facility], covid test 11/8 at 11:10.  Pt had Super D at [facility] on 10/27 and Jinny Blossom is going to have disk sent to [facility].  Gave appt info to pt.

## 2020-07-15 ENCOUNTER — Other Ambulatory Visit (HOSPITAL_COMMUNITY)
Admission: RE | Admit: 2020-07-15 | Discharge: 2020-07-15 | Disposition: A | Discharge status: Home / Self Care | Payer: Medicare HMO | Source: Ambulatory Visit | Attending: Pulmonary Disease | Admitting: Pulmonary Disease

## 2020-07-15 DIAGNOSIS — Z20822 Contact with and (suspected) exposure to covid-19: Secondary | ICD-10-CM | POA: Diagnosis not present

## 2020-07-15 DIAGNOSIS — Z01812 Encounter for preprocedural laboratory examination: Secondary | ICD-10-CM | POA: Diagnosis present

## 2020-07-15 LAB — SARS CORONAVIRUS 2 (TAT 6-24 HRS): SARS Coronavirus 2: NEGATIVE

## 2020-07-17 ENCOUNTER — Encounter (HOSPITAL_COMMUNITY): Payer: Self-pay | Admitting: Pulmonary Disease

## 2020-07-17 NOTE — Progress Notes (Signed)
Patient denies shortness of breath, fever, cough or chest pain.  PCP - Dr Lin Landsman Cardiologist - Dr Virgina Jock  Chest x-ray - 04/01/20 (2V) EKG - 05/29/20 Stress Test - 06/10/20 ECHO - 06/18/20 Cardiac Cath - n/a  Anesthesia review: Yes  STOP now taking any Aspirin (unless otherwise instructed by your surgeon), Aleve, Naproxen, Ibuprofen, Motrin, Advil, Goody's, BC's, all herbal medications, fish oil, and all vitamins.   Coronavirus Screening Covid test on 07/15/20 was negative.  Patient verbalized understanding of instructions that were given via phone.

## 2020-07-17 NOTE — Progress Notes (Signed)
Anesthesia Chart Review: SAME DAY WORK-UP    Case: [id number] Date/Time: 07/18/20 1100   Procedure: VIDEO BRONCHOSCOPY WITH ENDOBRONCHIAL NAVIGATION (N/A )   Anesthesia type: General   Pre-op diagnosis: MULTIPLE LUNG NODULES   Location: [place] [facility] [place] ENDOSCOPY   Surgeons: [name], DO      DISCUSSION: Patient is a 74 year old female scheduled for the above procedure.  History includes former smoker (quit 06/11/20), GERD, lung nodules.  Last evaluation by cardiologist Dr. Virgina Jock on 06/14/20 for follow-up chest pain with recent intermediate risk stress test followed by limited Cardiac CT with Calcium score of 0. There was in incidental finding of spiculated RUL lung nodule. Given ventricular bigeminy, he ordered an echo to rule out structural abnormality, with "No other treatment necessary at this time." Echo showed normal LVEF with normal wall motion, mild MR. He referred patient urgently to [facility] for work-up of lung lesion, and recommended as needed cardiology follow-up.    Second Pfizer COVID-19 vaccine 12/05/19.  07/15/2020 presurgical COVID-19 test negative.  Anesthesia team to evaluate on the day of surgery.   VS:  BP Readings from Last 3 Encounters:  07/11/20 104/60  06/19/20 110/62  06/14/20 129/64   Pulse Readings from Last 3 Encounters:  07/11/20 (!) 54  06/19/20 64  06/14/20 69    PROVIDERS: Lin Landsman, MD is PCP  Vernell Leep, MD is cardiologist   LABS: Lab results on 07/11/20 included:  Lab Results  Component Value Date   WBC 4.9 07/11/2020   HGB 12.6 07/11/2020   HCT 38.0 07/11/2020   PLT 200.0 07/11/2020   GLUCOSE 77 07/11/2020   NA 141 07/11/2020   K 3.9 07/11/2020   CL 106 07/11/2020   CREATININE 0.68 07/11/2020   BUN 10 07/11/2020   CO2 27 07/11/2020     PFTs 06/24/20: PFT Results Latest Ref Rng & Units 06/24/2020  FVC-Pre L 1.59  FVC-Predicted Pre % 69  FVC-Post L 1.89  FVC-Predicted Post % 82  Pre FEV1/FVC %  % 67  Post FEV1/FCV % % 68  FEV1-Pre L 1.06  FEV1-Predicted Pre % 59  FEV1-Post L 1.28  DLCO uncorrected ml/min/mmHg 10.29  DLCO UNC% % 53  DLVA Predicted % 85  TLC L 4.78  TLC % Predicted % 94  RV % Predicted % 142     IMAGES: PET Scan 07/03/20: IMPRESSION: 1. Abnormal hypermetabolic activity in the 8 mm left upper lobe pulmonary nodule and in the 1.7 by 1.0 cm sub solid right pulmonary nodule, concerning for multifocal malignancy. The other pulmonary nodules are not appreciably hypermetabolic, but are below sensitive PET-CT size thresholds. 2. Small but borderline hypermetabolic mediastinal lymph nodes along the left hilum and anterior to the SVC. 3. Other imaging findings of potential clinical significance: Aortic Atherosclerosis (ICD10-I70.0) and Emphysema (ICD10-J43.9). Sludge or gallstones in the gallbladder. Nonobstructive left nephrolithiasis.  CT Super D Chest 07/03/20: IMPRESSION: 1. Enlarging spiculated solid nodule in the left upper lobe and enlarging part solid nodules in the right upper lobe, suspicious for malignancy. Referral to thoracic multi disciplinary clinic for further evaluation recommended. PET-CT may be helpful for further characterization of these nodules. 2. No evidence of metastatic disease. 3. Aortic Atherosclerosis (ICD10-I70.0) and Emphysema (ICD10-J43.9).   EKG: EKG 05/29/2020: Sinus rhythm 69 bpm Old anteroseptal infarct Anterolateral T wave inversion, consider ischemia   CV: Echocardiogram 06/18/2020:  Left ventricle cavity is normal in size. Mild concentric hypertrophy of  the left ventricle. Normal LV systolic function  with EF 57%. Normal global  wall motion. Normal diastolic filling pattern.  Left atrial cavity is mildly dilated.  Mild (Grade I) mitral regurgitation.Estimated RA pressure 8 mmHg.   CT Cardiac Calium Scoring 06/11/2020 ([facility]): EXAM: Limited Cardiac CT for calcium scoring.  FINDINGS:  TOTAL CALCIUM SCORE  :  0  CARDIAC ANATOMY:Normal.  VISUALIZED THORAX:Emphysema. There is a 1.8 cm spiculated nodule in the lateral right upper lobe.  A punctate nonobstructing left renal calyceal calculus is noted. Partially imaged left renal cyst also seen.  IMPRESSION:  - The total coronary calcium score is 0.  - Suspicious 1.8 cm spiculated nodule in the right upper lobe is concerning for primary lung malignancy in the setting of emphysema. Recommend full CT scan of the chest with contrast to better evaluate.     Exercise Myoview stress test 06/10/2020: Exercise nuclear stress test was performed using Bruce protocol. Patient reached 7 METS, and 103% of age predicted maximum heart rate. Exercise capacity was fair. Chest pain not reported. Heart rate and hemodynamic response were normal. Stress EKG showed sinus tachycardia, ventricular bigeminy for 28 sec in stage 1/2, 1.5-2 mm horizontal ST depression in leads II, V4-V6, normalizing 2 min into recovery.   SPECT Images showed small sized mild intensity, reversible perfusion defect in basal anteroseptal myocardium. Stress LVEF 74%.  Intermediate risk study.    Carotid US 07/01/2012: Summary:  No significant extracranial carotid artery stenosis  demonstrated. Vertebrals demonstrate antegrade flow. Right  vertebral is atypical with loss of diastolic component,  suggestive of distal stenosis.    Past Medical History:  Diagnosis Date  . Adenoma of left adrenal gland   . Colon polyps    hyperplastic  . GERD (gastroesophageal reflux disease)   . Kidney stone   . Liver lesion   . Microhematuria     Past Surgical History:  Procedure Laterality Date  . ABDOMINAL HYSTERECTOMY    . APPENDECTOMY      MEDICATIONS: No current facility-administered medications for this encounter.   Marland Kitchen albuterol (VENTOLIN HFA) 108 (90 Base) MCG/ACT inhaler  . alendronate (FOSAMAX) 70 MG tablet  . ALPRAZolam (XANAX) 1 MG tablet  . Calcium Carbonate-Vitamin D (CALCIUM + D PO)   . cyclobenzaprine (FLEXERIL) 5 MG tablet  . lidocaine (LIDODERM) 5 %  . Multiple Vitamin (MULTIVITAMIN WITH MINERALS) TABS  . naproxen (NAPROSYN) 375 MG tablet  . omeprazole (PRILOSEC) 40 MG capsule  . oxyCODONE-acetaminophen (PERCOCET) 10-325 MG tablet  . rosuvastatin (CRESTOR) 20 MG tablet  . Tiotropium Bromide-Olodaterol (STIOLTO RESPIMAT) 2.5-2.5 MCG/ACT AERS  . gabapentin (NEURONTIN) 100 MG capsule    Myra Gianotti, PA-C Surgical Short Stay/Anesthesiology [facility] Phone [phone] [facility] Phone [phone] 07/17/2020 10:33 AM

## 2020-07-17 NOTE — Anesthesia Preprocedure Evaluation (Addendum)
Anesthesia Evaluation  Patient identified by MRN, date of birth, ID band Patient awake    Reviewed: Allergy & Precautions, NPO status , Patient's Chart, lab work & pertinent test results  Airway Mallampati: II  TM Distance: >3 FB     Dental   Pulmonary former smoker,    breath sounds clear to auscultation       Cardiovascular negative cardio ROS   Rhythm:Regular Rate:Normal     Neuro/Psych negative psych ROS   GI/Hepatic Neg liver ROS, GERD  ,  Endo/Other    Renal/GU Renal disease     Musculoskeletal   Abdominal   Peds  Hematology   Anesthesia Other Findings   Reproductive/Obstetrics                             Anesthesia Physical Anesthesia Plan  ASA: III  Anesthesia Plan: General   Post-op Pain Management:    Induction: Intravenous  PONV Risk Score and Plan: 3 and Ondansetron, Midazolam and Dexamethasone  Airway Management Planned: Oral ETT  Additional Equipment:   Intra-op Plan:   Post-operative Plan: Possible Post-op intubation/ventilation  Informed Consent: I have reviewed the patients History and Physical, chart, labs and discussed the procedure including the risks, benefits and alternatives for the proposed anesthesia with the patient or authorized representative who has indicated his/her understanding and acceptance.     Dental advisory given  Plan Discussed with: CRNA and Anesthesiologist  Anesthesia Plan Comments: (PAT note written 07/17/2020 by Myra Gianotti, PA-C. )       Anesthesia Quick Evaluation

## 2020-07-18 ENCOUNTER — Ambulatory Visit (HOSPITAL_COMMUNITY): Payer: Medicare HMO | Admitting: Vascular Surgery

## 2020-07-18 ENCOUNTER — Other Ambulatory Visit: Payer: Self-pay

## 2020-07-18 ENCOUNTER — Encounter (HOSPITAL_COMMUNITY): Payer: Self-pay | Admitting: Pulmonary Disease

## 2020-07-18 ENCOUNTER — Ambulatory Visit (HOSPITAL_COMMUNITY)
Admission: RE | Admit: 2020-07-18 | Discharge: 2020-07-18 | Disposition: A | Discharge status: Home / Self Care | Payer: Medicare HMO | Attending: Pulmonary Disease | Admitting: Pulmonary Disease

## 2020-07-18 ENCOUNTER — Ambulatory Visit (HOSPITAL_COMMUNITY): Payer: Medicare HMO

## 2020-07-18 ENCOUNTER — Encounter (HOSPITAL_COMMUNITY)
Admission: RE | Disposition: A | Discharge status: Home / Self Care | Payer: Self-pay | Source: Home / Self Care | Attending: Pulmonary Disease

## 2020-07-18 DIAGNOSIS — Z7983 Long term (current) use of bisphosphonates: Secondary | ICD-10-CM | POA: Insufficient documentation

## 2020-07-18 DIAGNOSIS — R590 Localized enlarged lymph nodes: Secondary | ICD-10-CM | POA: Diagnosis not present

## 2020-07-18 DIAGNOSIS — C3412 Malignant neoplasm of upper lobe, left bronchus or lung: Secondary | ICD-10-CM | POA: Insufficient documentation

## 2020-07-18 DIAGNOSIS — Z79899 Other long term (current) drug therapy: Secondary | ICD-10-CM | POA: Diagnosis not present

## 2020-07-18 DIAGNOSIS — R911 Solitary pulmonary nodule: Secondary | ICD-10-CM | POA: Diagnosis present

## 2020-07-18 DIAGNOSIS — Z87891 Personal history of nicotine dependence: Secondary | ICD-10-CM | POA: Diagnosis not present

## 2020-07-18 DIAGNOSIS — R918 Other nonspecific abnormal finding of lung field: Secondary | ICD-10-CM

## 2020-07-18 DIAGNOSIS — J432 Centrilobular emphysema: Secondary | ICD-10-CM | POA: Insufficient documentation

## 2020-07-18 DIAGNOSIS — Z7982 Long term (current) use of aspirin: Secondary | ICD-10-CM | POA: Diagnosis not present

## 2020-07-18 DIAGNOSIS — Z9889 Other specified postprocedural states: Secondary | ICD-10-CM

## 2020-07-18 DIAGNOSIS — C3411 Malignant neoplasm of upper lobe, right bronchus or lung: Secondary | ICD-10-CM | POA: Diagnosis not present

## 2020-07-18 DIAGNOSIS — Z01818 Encounter for other preprocedural examination: Secondary | ICD-10-CM

## 2020-07-18 HISTORY — PX: BRONCHIAL NEEDLE ASPIRATION BIOPSY: SHX5106

## 2020-07-18 HISTORY — PX: FIDUCIAL MARKER PLACEMENT: SHX6858

## 2020-07-18 HISTORY — PX: BRONCHIAL BIOPSY: SHX5109

## 2020-07-18 HISTORY — PX: VIDEO BRONCHOSCOPY WITH ENDOBRONCHIAL ULTRASOUND: SHX6177

## 2020-07-18 HISTORY — PX: VIDEO BRONCHOSCOPY WITH ENDOBRONCHIAL NAVIGATION: SHX6175

## 2020-07-18 HISTORY — PX: BRONCHIAL BRUSHINGS: SHX5108

## 2020-07-18 HISTORY — PX: BRONCHIAL WASHINGS: SHX5105

## 2020-07-18 SURGERY — VIDEO BRONCHOSCOPY WITH ENDOBRONCHIAL NAVIGATION
Anesthesia: General

## 2020-07-18 MED ORDER — FENTANYL CITRATE (PF) 250 MCG/5ML IJ SOLN
INTRAMUSCULAR | Status: DC | PRN
  Administered 2020-07-18: 100 ug via INTRAVENOUS

## 2020-07-18 MED ORDER — ROCURONIUM BROMIDE 10 MG/ML (PF) SYRINGE
PREFILLED_SYRINGE | INTRAVENOUS | Status: DC | PRN
  Administered 2020-07-18: 20 mg via INTRAVENOUS
  Administered 2020-07-18: 50 mg via INTRAVENOUS

## 2020-07-18 MED ORDER — PROPOFOL 10 MG/ML IV BOLUS
INTRAVENOUS | Status: DC | PRN
  Administered 2020-07-18: 30 mg via INTRAVENOUS
  Administered 2020-07-18: 150 mg via INTRAVENOUS

## 2020-07-18 MED ORDER — SUGAMMADEX SODIUM 200 MG/2ML IV SOLN
INTRAVENOUS | Status: DC | PRN
  Administered 2020-07-18: 80 mg via INTRAVENOUS
  Administered 2020-07-18: 120 mg via INTRAVENOUS

## 2020-07-18 MED ORDER — PHENYLEPHRINE HCL-NACL 10-0.9 MG/250ML-% IV SOLN
INTRAVENOUS | Status: DC | PRN
  Administered 2020-07-18: 25 ug/min via INTRAVENOUS

## 2020-07-18 MED ORDER — CHLORHEXIDINE GLUCONATE 0.12 % MT SOLN
15.0000 mL | Freq: Once | OROMUCOSAL | Status: AC
  Administered 2020-07-18: 15 mL via OROMUCOSAL
  Filled 2020-07-18: qty 15

## 2020-07-18 MED ORDER — CHLORHEXIDINE GLUCONATE 0.12 % MT SOLN
OROMUCOSAL | Status: AC
  Filled 2020-07-18: qty 15

## 2020-07-18 MED ORDER — DEXAMETHASONE SODIUM PHOSPHATE 10 MG/ML IJ SOLN
INTRAMUSCULAR | Status: DC | PRN
  Administered 2020-07-18: 5 mg via INTRAVENOUS

## 2020-07-18 MED ORDER — LACTATED RINGERS IV SOLN: INTRAVENOUS | Status: DC

## 2020-07-18 MED ORDER — LIDOCAINE 2% (20 MG/ML) 5 ML SYRINGE
INTRAMUSCULAR | Status: DC | PRN
  Administered 2020-07-18: 100 mg via INTRAVENOUS

## 2020-07-18 MED ORDER — ONDANSETRON HCL 4 MG/2ML IJ SOLN
INTRAMUSCULAR | Status: DC | PRN
  Administered 2020-07-18 (×2): 4 mg via INTRAVENOUS

## 2020-07-18 SURGICAL SUPPLY — 48 items

## 2020-07-18 NOTE — Transfer of Care (Signed)
Immediate Anesthesia Transfer of Care Note  Patient: Tonya [name]  Procedure(s) Performed: VIDEO BRONCHOSCOPY WITH ENDOBRONCHIAL NAVIGATION (N/A ) VIDEO BRONCHOSCOPY WITH ENDOBRONCHIAL ULTRASOUND BRONCHIAL BIOPSIES BRONCHIAL WASHINGS BRONCHIAL NEEDLE ASPIRATION BIOPSIES BRONCHIAL BRUSHINGS FIDUCIAL MARKER PLACEMENT  Patient Location: Endoscopy Unit  Anesthesia Type:General  Level of Consciousness: awake, alert , oriented and drowsy  Airway & Oxygen Therapy: Patient Spontanous Breathing and Patient connected to face mask oxygen  Post-op Assessment: Report given to RN, Post -op Vital signs reviewed and stable and Patient moving all extremities X 4  Post vital signs: Reviewed and stable  Last Vitals:  Vitals Value Taken Time  BP 158/141 07/18/20 1241  Temp    Pulse 68 07/18/20 1242  Resp 15 07/18/20 1242  SpO2 86 % 07/18/20 1242  Vitals shown include unvalidated device data.  Last Pain:  Vitals:   07/18/20 0939  TempSrc:   PainSc: 0-No pain         Complications: No complications documented.

## 2020-07-18 NOTE — Op Note (Addendum)
Video Bronchoscopy with Electromagnetic Navigation, fiducial marking, endobronchial ultrasound procedure Note  Date of Operation: 07/18/2020  Pre-op Diagnosis: Bilateral pulmonary nodules  Post-op Diagnosis: Bilateral pulmonary nodules  Surgeon: [name], DO   Assistants: None   Anesthesia: General endotracheal anesthesia  Operation: Flexible video fiberoptic bronchoscopy with electromagnetic navigation and biopsies.  Estimated Blood Loss: Minimal, <5HG  Complications: None   Indications and History: Tonya [name] is a 74 y.o. female with bilateral right upper lobe and left upper lobe pulmonary nodules, small hypermetabolic focus anterior to the SVC and small focus within the left hilum.  The risks, benefits, complications, treatment options and expected outcomes were discussed with the patient.  The possibilities of pneumothorax, pneumonia, reaction to medication, pulmonary aspiration, perforation of a viscus, bleeding, failure to diagnose a condition and creating a complication requiring transfusion or operation were discussed with the patient who freely signed the consent.    Description of Procedure: The patient was seen in the Preoperative Area, was examined and was deemed appropriate to proceed.  The patient was taken to [facility] endoscopy room 2, identified as [name] and the procedure verified as Flexible Video Fiberoptic Bronchoscopy.  A Time Out was held and the above information confirmed.   Prior to the date of the procedure a high-resolution CT scan of the chest was performed. Utilizing [place] a virtual tracheobronchial tree was generated to allow the creation of distinct navigation pathways to the patient's parenchymal abnormalities. After being taken to the operating room general anesthesia was initiated and the patient  was orally intubated. The video fiberoptic bronchoscope was introduced via the endotracheal tube and a general  inspection was performed which showed normal right and left lung anatomy, distal scattered pitting and striations.  No evidence of endobronchial lesion.   Target #1 right upper lobe: The extendable working channel and locator guide were introduced into the bronchoscope.  A full fluoroscopic sweep from RAO 25 degrees to LAO 25 degrees was completed with an inspiratory breath-hold with APL at 20 cm water.  This was completed for local registration and enhanced fluoroscopic navigation. The distinct navigation pathways prepared prior to this procedure were then utilized to navigate to within 0.8 cm of patient's lesion(s) identified on CT scan. The extendable working channel was secured into place and the locator guide was withdrawn. Under fluoroscopic guidance transbronchial needle brushings, transbronchial Wang needle biopsies, and transbronchial forceps biopsies were performed to be sent for cytology and pathology.  At the end of specimen collection 3 fiducial markers were placed in separate axial planes using the fiducial guide delivery kit and wire advancement. A bronchioalveolar lavage was performed in the right upper lobe and sent for cytology.  Target #2 left upper lobe: The extendable working channel and locator guide were introduced into the bronchoscope.  A full fluoroscopic sweep from RAO 25 degrees to LAO 25 degrees was completed with an inspiratory breath-hold with APL at 20 cm water.  This was completed for local registration and enhanced fluoroscopic navigation. The distinct navigation pathways prepared prior to this procedure were then utilized to navigate to within 1.8 cm of patient's lesion(s) identified on CT scan. The extendable working channel was secured into place and the locator guide was withdrawn. Under fluoroscopic guidance transbronchial needle brushings, transbronchial Wang needle biopsies, and transbronchial forceps biopsies were performed to be sent for cytology and pathology.  At  the end of specimen collection 2 fiducial markers were placed in separate axial planes using the fiducial guide  delivery kit and wire advancement. A bronchioalveolar lavage was performed in the left upper lobe and sent for cytology.  Endobronchial ultrasound: At the completion of the navigational portion of the procedure the endobronchial ultrasound Olympus scope was inserted into the patient's airway.  Each station pretracheal bilateral hilar and subcarinal regions were ultrasounded.  Special attention was focused to the area along the right anterior mainstem to see if we could find anything that correlated with the lesion found on PET scan.  Per pet imaging was only documented at 4 mm.  There was no identified lesion.  Additional time spent looking within the left hilum.  There was a small section of nodal structure within the left hilum with its largest cross-section at being 5 mm.  Due to its size and adjacent to the pulmonary vasculature this was not sampled.  There was only visible connective tissue present within the subcarinal space.  At the end of the procedure a general airway inspection was performed and there was no evidence of active bleeding.  Therapeutic bronchoscope was inserted into the patient's airway and aspiration of the bilateral mainstem's for removal of any blood clots, debris or secretions.  Both bilateral mainstem's and distal airways were patent.  There was no evidence of active bleeding and the scope was brought to just above the main carina.  The bronchoscope was removed.  The patient tolerated the procedure well. There was no significant blood loss and there were no obvious complications. A post-procedural chest x-ray is pending.  Samples target #1 right upper lobe: 1. Transbronchial needle brushings from right upper lobe 2. Transbronchial Wang needle biopsies from right upper lobe 3. Transbronchial forceps biopsies from right upper lobe 4. Bronchoalveolar lavage from right  upper lobe  Samples target #2 left upper lobe: 1. Transbronchial needle brushings from left upper lobe 2. Transbronchial Wang needle biopsies from left upper lobe 3. Transbronchial forceps biopsies from left upper lobe 4. Bronchoalveolar lavage from left upper lobe  Plans:  The patient will be discharged from the PACU to home when recovered from anesthesia and after chest x-ray is reviewed. We will review the cytology, pathology and microbiology results with the patient when they become available. Outpatient followup will be with [name], DO.   [name], DO [facility] 07/18/2020 12:51 PM

## 2020-07-18 NOTE — Interval H&P Note (Signed)
History and Physical Interval Note:  07/18/2020 9:00 AM  Tonya [name]  has presented today for surgery, with the diagnosis of Dean.  The various methods of treatment have been discussed with the patient and family. After consideration of risks, benefits and other options for treatment, the patient has consented to  Procedure(s): [place] (N/A) as a surgical intervention.  The patient's history has been reviewed, patient examined, no change in status, stable for surgery.  I have reviewed the patient's chart and labs.  Questions were answered to the patient's satisfaction.    We discussed possible need for endobronchial ultrasound. We discussed risks, benefits and alternatives to procedure. We dicussed risks of bleeding and pneumothorax.   [place]

## 2020-07-18 NOTE — Anesthesia Postprocedure Evaluation (Signed)
Anesthesia Post Note  Patient: Tonya [name]  Procedure(s) Performed: VIDEO BRONCHOSCOPY WITH ENDOBRONCHIAL NAVIGATION (N/A ) VIDEO BRONCHOSCOPY WITH ENDOBRONCHIAL ULTRASOUND BRONCHIAL BIOPSIES BRONCHIAL WASHINGS BRONCHIAL NEEDLE ASPIRATION BIOPSIES BRONCHIAL BRUSHINGS FIDUCIAL MARKER PLACEMENT     Patient location during evaluation: PACU Anesthesia Type: General Level of consciousness: awake Pain management: pain level controlled Vital Signs Assessment: post-procedure vital signs reviewed and stable Respiratory status: spontaneous breathing Cardiovascular status: stable Postop Assessment: no apparent nausea or vomiting Anesthetic complications: no   No complications documented.  Last Vitals:  Vitals:   07/18/20 1330 07/18/20 1345  BP: (!) 145/68   Pulse: 69 79  Resp: 15 12  Temp:    SpO2: 93% 97%    Last Pain:  Vitals:   07/18/20 1315  TempSrc:   PainSc: 3                  [name]

## 2020-07-18 NOTE — Discharge Instructions (Signed)
Flexible Bronchoscopy, Care After This sheet gives you information about how to care for yourself after your procedure. Your health care provider may also give you more specific instructions. If you have problems or questions, contact your health care provider. What can I expect after the procedure? After the procedure, it is common to have the following symptoms for 24-48 hours:  A cough that is worse than it was before the procedure.  A low-grade fever.  A sore throat or hoarse voice.  Small streaks of blood in the mucus from your lungs (sputum), if tissue samples were removed (biopsy). Follow these instructions at home: Eating and drinking  Do not eat or drink anything (including water) for 2 hours after your procedure, or until your numbing medicine (local anesthetic) has worn off. Having a numb throat increases your risk of burning yourself or choking.  After your numbness is gone and your cough and gag reflexes have returned, you may start eating only soft foods and slowly drinking liquids.  The day after the procedure, return to your normal diet. Driving  Do not drive for 24 hours if you were given a medicine to help you relax (sedative).  Do not drive or use heavy machinery while taking prescription pain medicine. General instructions   Take over-the-counter and prescription medicines only as told by your health care provider.  Return to your normal activities as told by your health care provider. Ask your health care provider what activities are safe for you.  Do not use any products that contain nicotine or tobacco, such as cigarettes and e-cigarettes. If you need help quitting, ask your health care provider.  Keep all follow-up visits as told by your health care provider. This is important, especially if you had a biopsy taken. Get help right away if:  You have shortness of breath that gets worse.  You become light-headed or feel like you might faint.  You have  chest pain.  You cough up more than a small amount of blood.  The amount of blood you cough up increases. Summary  Common symptoms in the 24-48 hours following a flexible bronchoscopy include cough, low-grade fever, sore throat or hoarse voice, and blood-streaked mucus from the lungs (if you had a biopsy).  Do not eat or drink anything (including water) for 2 hours after your procedure, or until your local anesthetic has worn off. You can return to your normal diet the day after the procedure.  Get help right away if you develop worsening shortness of breath, have chest pain, become light-headed, or cough up more than a small amount of blood. This information is not intended to replace advice given to you by your health care provider. Make sure you discuss any questions you have with your health care provider. Document Revised: 08/06/2017 Document Reviewed: 09/11/2016 Elsevier Patient Education  2020 [employer].

## 2020-07-18 NOTE — Anesthesia Procedure Notes (Signed)
Procedure Name: Intubation Date/Time: 07/18/2020 10:42 AM Performed by: Lance Coon, CRNA Pre-anesthesia Checklist: Patient identified, Emergency Drugs available, Suction available, Patient being monitored and Timeout performed Patient Re-evaluated:Patient Re-evaluated prior to induction Oxygen Delivery Method: Circle system utilized Preoxygenation: Pre-oxygenation with 100% oxygen Induction Type: IV induction Ventilation: Mask ventilation without difficulty Laryngoscope Size: Miller and 2 Grade View: Grade I Tube type: Oral Tube size: 8.5 mm Number of attempts: 1 Airway Equipment and Method: Stylet Placement Confirmation: ETT inserted through vocal cords under direct vision,  positive ETCO2 and breath sounds checked- equal and bilateral Secured at: 21 cm Tube secured with: Tape Dental Injury: Teeth and Oropharynx as per pre-operative assessment

## 2020-07-21 ENCOUNTER — Encounter (HOSPITAL_COMMUNITY): Payer: Self-pay | Admitting: Pulmonary Disease

## 2020-07-23 LAB — CYTOLOGY - NON PAP

## 2020-07-24 ENCOUNTER — Telehealth: Payer: Self-pay | Admitting: Pulmonary Disease

## 2020-07-24 NOTE — Telephone Encounter (Signed)
PCCM:  I attempted to call the patient regarding her recent bronchoscopy results. Both lesions within the right and left lung were positive for malignancy.  I left a voicemail for her to give [place] a call back.  If she calls back tell her that I will call her tomorrow on 07/25/2020.  If she calls back midmorning we can always add her on as a telemetry visit tomorrow to discuss next steps.  [name], DO [facility] 07/24/2020 5:49 PM

## 2020-07-25 ENCOUNTER — Telehealth: Payer: Self-pay | Admitting: Pulmonary Disease

## 2020-07-25 DIAGNOSIS — C3491 Malignant neoplasm of unspecified part of right bronchus or lung: Secondary | ICD-10-CM

## 2020-07-25 DIAGNOSIS — C3492 Malignant neoplasm of unspecified part of left bronchus or lung: Secondary | ICD-10-CM

## 2020-07-25 NOTE — Telephone Encounter (Signed)
PCCM:  I spoke with patient regarding path results.   Case discussed in [place] this morning.   Referral to [facility] to discuss wedge resections.   [name], DO [facility] 07/25/2020 8:02 AM

## 2020-08-08 ENCOUNTER — Other Ambulatory Visit: Payer: Self-pay | Admitting: *Deleted

## 2020-08-08 NOTE — Progress Notes (Signed)
The proposed treatment discussed in cancer conference 12/2 is for discussion purpose only and is not a binding recommendation.  The patient was not physically examined nor present for their treatment options.  Therefore, final treatment plans cannot be decided.

## 2020-08-09 ENCOUNTER — Institutional Professional Consult (permissible substitution): Payer: Medicare HMO | Admitting: Thoracic Surgery (Cardiothoracic Vascular Surgery)

## 2020-08-09 ENCOUNTER — Other Ambulatory Visit: Payer: Self-pay

## 2020-08-09 ENCOUNTER — Encounter: Payer: Self-pay | Admitting: Thoracic Surgery (Cardiothoracic Vascular Surgery)

## 2020-08-09 VITALS — BP 118/65 | HR 72 | Resp 18 | Ht 64.0 in | Wt 135.0 lb

## 2020-08-09 DIAGNOSIS — R918 Other nonspecific abnormal finding of lung field: Secondary | ICD-10-CM

## 2020-08-09 NOTE — Progress Notes (Signed)
[address]       [place],Tonya [name]             [phone]                    Tonya [name] Medical Record #[id number] Date of Birth: December 12, 1945  Referring: [name], DO Primary Care: Lin Landsman, MD Primary Cardiologist: No primary care provider on file.  Chief Complaint:    Chief Complaint  Patient presents with  . Lung Mass    Initial surgical consult to discuss surgical options, s/p bronch nav 11/11    History of Present Illness:    Tonya [name] 74 y.o. female referred by Dr. Valeta Harms for surgical evaluation of bilateral upper lobe pulmonary nodules. These were found incidentally when she presented to the emergency department in the summer 2021 with left-sided chest pain with radiation down her left arm.  She was ruled out for an MI and followed up with her cardiologist. She underwent a calcium scoring CT which identified the right upper lobe nodule. She subsequently followed up with Dr. Valeta Harms for further evaluation. She underwent a navigational bronchoscopy with biopsy of bilateral nodules, and both were consistent with non-small cell lung cancer.  She admits to a 35 pound weight loss over the past year but attributes most of this to stress and depression after caring for her ailing mother who died last year. She denies any neurologic symptoms.    Smoking Hx: Quit smoking 5 months ago. Previously smoked about a half a pack a day   Zubrod Score: At the time of surgery this patient's most appropriate activity status/level should be described as: [x]     0    Normal activity, no symptoms []     1    Restricted in physical strenuous activity but ambulatory, able to do out light work []     2    Ambulatory and capable of self care, unable to do work activities, up and about               >50 % of waking hours                              []     3    Only limited self care, in bed greater than 50% of waking hours []      4    Completely disabled, no self care, confined to bed or chair []     5    Moribund   Past Medical History:  Diagnosis Date  . Adenoma of left adrenal gland   . Colon polyps    hyperplastic  . GERD (gastroesophageal reflux disease)   . History of kidney stones   . Kidney stone   . Liver lesion   . Microhematuria     Past Surgical History:  Procedure Laterality Date  . ABDOMINAL HYSTERECTOMY    . APPENDECTOMY    . BRONCHIAL BIOPSY  07/18/2020   Procedure: BRONCHIAL BIOPSIES;  Surgeon: [name], DO;  Location: [facility];  Service: Pulmonary;;  . BRONCHIAL BRUSHINGS  07/18/2020   Procedure: BRONCHIAL BRUSHINGS;  Surgeon: [name], DO;  Location: [facility];  Service: Pulmonary;;  . BRONCHIAL NEEDLE ASPIRATION BIOPSY  07/18/2020   Procedure: BRONCHIAL NEEDLE ASPIRATION BIOPSIES;  Surgeon: [name], DO;  Location: [facility];  Service: Pulmonary;;  . BRONCHIAL WASHINGS  07/18/2020   Procedure: BRONCHIAL WASHINGS;  Surgeon: [name], DO;  Location: [facility];  Service: Pulmonary;;  . FIDUCIAL MARKER PLACEMENT  07/18/2020   Procedure: FIDUCIAL MARKER PLACEMENT;  Surgeon: [name], DO;  Location: [facility];  Service: Pulmonary;;  . VIDEO BRONCHOSCOPY WITH ENDOBRONCHIAL NAVIGATION N/A 07/18/2020   Procedure: VIDEO BRONCHOSCOPY WITH ENDOBRONCHIAL NAVIGATION;  Surgeon: [name], DO;  Location: [facility];  Service: Pulmonary;  Laterality: N/A;  . VIDEO BRONCHOSCOPY WITH ENDOBRONCHIAL ULTRASOUND  07/18/2020   Procedure: VIDEO BRONCHOSCOPY WITH ENDOBRONCHIAL ULTRASOUND;  Surgeon: [name], DO;  Location: [facility];  Service: Pulmonary;;    Family History  Problem Relation Age of Onset  . Arrhythmia Mother        s/p Pacer   . Heart attack Father   . Heart disease Sister   . Colon cancer Neg Hx      Social History   Tobacco Use  Smoking Status Former Smoker  . Packs/day: 0.30  . Years: 60.00  . Pack  years: 18.00  . Types: Cigarettes  . Quit date: 06/11/2020  . Years since quitting: 0.1  Smokeless Tobacco Never Used    Social History   Substance and Sexual Activity  Alcohol Use Yes   Comment: 0.5 drink per month     Allergies  Allergen Reactions  . Penicillins     REACTION: hives    Current Outpatient Medications  Medication Sig Dispense Refill  . albuterol (VENTOLIN HFA) 108 (90 Base) MCG/ACT inhaler Inhale 1-2 puffs into the lungs every 4 (four) hours as needed for wheezing or shortness of breath.     Marland Kitchen alendronate (FOSAMAX) 70 MG tablet Take 70 mg by mouth once a week.     . ALPRAZolam (XANAX) 1 MG tablet Take 1 tablet by mouth at bedtime. Can be used TID but only takes at night to sleep    . Calcium Carbonate-Vitamin D (CALCIUM + D PO) Take 600 mg by mouth daily.     . cyclobenzaprine (FLEXERIL) 5 MG tablet Take 5 mg by mouth 3 (three) times daily as needed for muscle spasms (sciatic nerve).     . gabapentin (NEURONTIN) 100 MG capsule Take 100 mg by mouth at bedtime.    . lidocaine (LIDODERM) 5 % Place 1 patch onto the skin daily as needed. Apply patch to area most significant pain once per day.  Remove and discard patch within 12 hours of application. (Patient taking differently: Place 1 patch onto the skin daily as needed (Pain). Apply patch to area most significant pain once per day.  Remove and discard patch within 12 hours of application.) 30 patch 0  . Multiple Vitamin (MULTIVITAMIN WITH MINERALS) TABS Take 1 tablet by mouth daily.    . naproxen (NAPROSYN) 375 MG tablet Take 1 tablet (375 mg total) by mouth 2 (two) times daily as needed for moderate pain. 8 tablet 0  . omeprazole (PRILOSEC) 40 MG capsule Take 40 mg by mouth at bedtime.     Marland Kitchen oxyCODONE-acetaminophen (PERCOCET) 10-325 MG tablet Take 1 tablet by mouth every 4 (four) hours as needed for moderate pain.     . rosuvastatin (CRESTOR) 20 MG tablet Take 1 tablet (20 mg total) by mouth daily. (Patient taking  differently: Take 10 mg by mouth at bedtime. ) 30 tablet 0  . Tiotropium Bromide-Olodaterol (STIOLTO RESPIMAT) 2.5-2.5 MCG/ACT AERS Inhale 2 puffs into the lungs daily. (Patient taking differently: Inhale 2 puffs into the lungs daily as  needed (Shortness of breath). ) 4 g 0   No current facility-administered medications for this visit.    Review of Systems  Constitutional: Positive for malaise/fatigue and weight loss.  Respiratory: Negative.   Cardiovascular: Negative.   Musculoskeletal: Positive for back pain, joint pain and myalgias.  Neurological: Negative.      PHYSICAL EXAMINATION: BP 118/65 (BP Location: Right Arm, Patient Position: Sitting)   Pulse 72   Resp 18   Ht 5\' 4"  (1.626 m)   Wt 135 lb (61.2 kg)   SpO2 93% Comment: RA with mask on  BMI 23.17 kg/m  Physical Exam Constitutional:      General: She is not in acute distress.    Appearance: Normal appearance. She is normal weight. She is not ill-appearing.  HENT:     Head: Normocephalic and atraumatic.  Eyes:     Extraocular Movements: Extraocular movements intact.  Cardiovascular:     Rate and Rhythm: Normal rate.  Pulmonary:     Effort: Pulmonary effort is normal. No respiratory distress.  Abdominal:     General: Abdomen is flat. There is no distension.  Musculoskeletal:        General: Normal range of motion.     Cervical back: Normal range of motion.  Skin:    General: Skin is warm and dry.  Neurological:     General: No focal deficit present.     Mental Status: She is alert and oriented to person, place, and time.     Diagnostic Studies & Laboratory data:     Recent Radiology Findings:   DG CHEST PORT 1 VIEW  Result Date: 07/18/2020 CLINICAL DATA:  Post bronchoscopy with BILATERAL lung biopsy. EXAM: PORTABLE CHEST 1 VIEW COMPARISON:  CT chest 07/03/2020.  Chest x-rays 04/01/2020. FINDINGS: No evidence of pneumothorax or pneumomediastinum post bronchoscopy. Opaque tissue markers in the upper lobes  bilaterally. Patchy opacities in the upper lobes, likely due to bronchial washings obtained at the time of bronchoscopy. Lungs otherwise clear. Heart size normal for AP portable technique. IMPRESSION: 1. No evidence of pneumothorax or pneumomediastinum post bronchoscopy. 2. Patchy opacities involving the upper lobes, likely due to bronchial washings obtained at the time of bronchoscopy. Electronically Signed   By: Evangeline Dakin M.D.   On: 07/18/2020 13:11   DG C-ARM BRONCHOSCOPY  Result Date: 07/18/2020 C-ARM BRONCHOSCOPY: Fluoroscopy was utilized by the requesting physician.  No radiographic interpretation.       I have independently reviewed the above radiology studies  and reviewed the findings with the patient.   Recent Lab Findings: Lab Results  Component Value Date   WBC 4.9 07/11/2020   HGB 12.6 07/11/2020   HCT 38.0 07/11/2020   PLT 200.0 07/11/2020   GLUCOSE 77 07/11/2020   CHOL 243 (H) 06/30/2012   TRIG 109 06/30/2012   HDL 53 06/30/2012   LDLCALC 168 (H) 06/30/2012   ALT 18 09/03/2016   AST 21 09/03/2016   NA 141 07/11/2020   K 3.9 07/11/2020   CL 106 07/11/2020   CREATININE 0.68 07/11/2020   BUN 10 07/11/2020   CO2 27 07/11/2020   TSH 0.504 06/30/2012   HGBA1C 5.6 06/30/2012     PFTs: - FVC: 69% - FEV1: 59% -DLCO: 53%  Problem List: Left upper lobe 0.8 cm pulmonary nodule with SUV of 2.7 Right upper lobe 1.7 cm pulmonary nodule with SUV of 3.3 Right upper lobe 0.7 cm pulmonary nodule with SUV of 0.8 concerning for satellite lesion. Faint uptake and  a 0.4 cm lymph node anterior to the SVC. SUV 3.9 Small left hilar lymph node with SUV of 3.6 Marginal pulmonary function   Assessment / Plan:   This is a 73 year old female that presents with biopsy-proven  non-small cell lung cancers in bilateral upper lobes. She also has a 0.7 cm right upper lobe nodule concerning for a satellite lesion, and faint uptake in a left hilar lymph node, and mediastinal lymph  node on the right. Based off of her imaging I think this is more consistent with synchronous primaries as opposed to stage IV disease. She does not have significant uptake in either the mediastinal lymph nodes or either hilar nodes.   We discussed several options for management of these lung cancers which included SBRT to both lesions versus surgical resection of the right upper lobe and SBRT to left upper lobe. Based off of her lung function I think that she can tolerate the right upper lobectomy, but she would not be able to tolerate bilateral upper lobe resections. She is agreeable to proceed with this plan. I will obtain an MRI brain for completion, and speak with her cardiologist in regards to preop clearance. She is tentatively scheduled for August 29, 2020. She will undergo a right robotic assisted right upper lobectomy.   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     I  spent 55 minutes with  the patient face to face and greater then 50% of the time was spent in counseling and coordination of care.    Tonya [name] 08/09/2020 4:59 PM

## 2020-08-12 ENCOUNTER — Encounter: Payer: Self-pay | Admitting: *Deleted

## 2020-08-12 ENCOUNTER — Other Ambulatory Visit: Payer: Self-pay | Admitting: *Deleted

## 2020-08-12 ENCOUNTER — Encounter: Payer: Self-pay | Admitting: Thoracic Surgery (Cardiothoracic Vascular Surgery)

## 2020-08-12 ENCOUNTER — Telehealth (INDEPENDENT_AMBULATORY_CARE_PROVIDER_SITE_OTHER): Payer: Medicare HMO | Admitting: Thoracic Surgery (Cardiothoracic Vascular Surgery)

## 2020-08-12 ENCOUNTER — Other Ambulatory Visit: Payer: Self-pay

## 2020-08-12 DIAGNOSIS — C3411 Malignant neoplasm of upper lobe, right bronchus or lung: Secondary | ICD-10-CM

## 2020-08-12 NOTE — Progress Notes (Signed)
     [address]       [place] [address]             [phone]       Virtual phone visit lasting lasting 6min I had a conversation with Tonya [name] and her husband about the clinical plan.  They are agreeable to proceed.  Aureliano Oshields Bary Leriche

## 2020-08-12 NOTE — H&P (View-Only) (Signed)
     [address]       [place] [address]             [phone]       Virtual phone visit lasting lasting 19min I had a conversation with [name] and her husband about the clinical plan.  They are agreeable to proceed.  Tonya [name] Bary Leriche

## 2020-08-15 ENCOUNTER — Telehealth: Payer: Self-pay

## 2020-08-15 NOTE — Telephone Encounter (Signed)
Hey Dr. Kipp Brood,  I reviewed her chart and stress test again. Very small perfusion defect seen on stress test likely attenuation. Calcium score is 0, making obstructive CAD exceedingly unlikely. Echocardiogram is unremarkable. I feel her perioperative cardiac risk is low. Okay to proceed with surgery as scheduled. Feel free to call me if you have any questions.  Thanks [employer] [phone]

## 2020-08-15 NOTE — Telephone Encounter (Signed)
Tonya [name] called from [facility] wanting to know due to scan results/lexi, does she need any further workup, card clearance prior to procedure on 12/20. Would like for you to call him

## 2020-08-16 ENCOUNTER — Other Ambulatory Visit: Payer: Self-pay

## 2020-08-16 ENCOUNTER — Ambulatory Visit (HOSPITAL_COMMUNITY)
Admission: RE | Admit: 2020-08-16 | Discharge: 2020-08-16 | Disposition: A | Discharge status: Home / Self Care | Payer: Medicare HMO | Source: Ambulatory Visit | Attending: Thoracic Surgery (Cardiothoracic Vascular Surgery) | Admitting: Thoracic Surgery (Cardiothoracic Vascular Surgery)

## 2020-08-16 DIAGNOSIS — R918 Other nonspecific abnormal finding of lung field: Secondary | ICD-10-CM

## 2020-08-16 MED ORDER — GADOBUTROL 1 MMOL/ML IV SOLN
6.0000 mL | Freq: Once | INTRAVENOUS | Status: AC | PRN
  Administered 2020-08-16: 6 mL via INTRAVENOUS

## 2020-08-22 ENCOUNTER — Other Ambulatory Visit (HOSPITAL_COMMUNITY)
Admission: RE | Admit: 2020-08-22 | Discharge: 2020-08-22 | Disposition: A | Discharge status: Home / Self Care | Payer: Medicare HMO | Source: Ambulatory Visit | Attending: Thoracic Surgery (Cardiothoracic Vascular Surgery) | Admitting: Thoracic Surgery (Cardiothoracic Vascular Surgery)

## 2020-08-22 ENCOUNTER — Encounter: Payer: Self-pay | Admitting: Pulmonary Disease

## 2020-08-22 ENCOUNTER — Encounter (HOSPITAL_COMMUNITY)
Admission: RE | Admit: 2020-08-22 | Discharge: 2020-08-22 | Disposition: A | Discharge status: Home / Self Care | Payer: Medicare HMO | Source: Ambulatory Visit | Attending: Thoracic Surgery (Cardiothoracic Vascular Surgery) | Admitting: Thoracic Surgery (Cardiothoracic Vascular Surgery)

## 2020-08-22 ENCOUNTER — Ambulatory Visit: Payer: Medicare HMO | Admitting: Pulmonary Disease

## 2020-08-22 ENCOUNTER — Other Ambulatory Visit: Payer: Self-pay

## 2020-08-22 ENCOUNTER — Encounter (HOSPITAL_COMMUNITY): Payer: Self-pay

## 2020-08-22 VITALS — BP 110/62 | HR 61 | Temp 98.0°F | Ht 64.0 in | Wt 135.5 lb

## 2020-08-22 DIAGNOSIS — C3411 Malignant neoplasm of upper lobe, right bronchus or lung: Secondary | ICD-10-CM

## 2020-08-22 DIAGNOSIS — Z79899 Other long term (current) drug therapy: Secondary | ICD-10-CM | POA: Diagnosis not present

## 2020-08-22 DIAGNOSIS — R918 Other nonspecific abnormal finding of lung field: Secondary | ICD-10-CM | POA: Diagnosis not present

## 2020-08-22 DIAGNOSIS — C3491 Malignant neoplasm of unspecified part of right bronchus or lung: Secondary | ICD-10-CM | POA: Diagnosis not present

## 2020-08-22 DIAGNOSIS — Z87891 Personal history of nicotine dependence: Secondary | ICD-10-CM | POA: Diagnosis not present

## 2020-08-22 DIAGNOSIS — C3492 Malignant neoplasm of unspecified part of left bronchus or lung: Secondary | ICD-10-CM

## 2020-08-22 DIAGNOSIS — R942 Abnormal results of pulmonary function studies: Secondary | ICD-10-CM | POA: Diagnosis not present

## 2020-08-22 DIAGNOSIS — J432 Centrilobular emphysema: Secondary | ICD-10-CM | POA: Diagnosis not present

## 2020-08-22 DIAGNOSIS — Z01818 Encounter for other preprocedural examination: Secondary | ICD-10-CM | POA: Diagnosis not present

## 2020-08-22 DIAGNOSIS — K219 Gastro-esophageal reflux disease without esophagitis: Secondary | ICD-10-CM | POA: Diagnosis not present

## 2020-08-22 DIAGNOSIS — Z20822 Contact with and (suspected) exposure to covid-19: Secondary | ICD-10-CM | POA: Insufficient documentation

## 2020-08-22 DIAGNOSIS — Z01812 Encounter for preprocedural laboratory examination: Secondary | ICD-10-CM | POA: Insufficient documentation

## 2020-08-22 HISTORY — DX: Malignant (primary) neoplasm, unspecified: C80.1

## 2020-08-22 HISTORY — DX: Other complications of anesthesia, initial encounter: T88.59XA

## 2020-08-22 LAB — COMPREHENSIVE METABOLIC PANEL
ALT: 11 U/L (ref 0–44)
AST: 17 U/L (ref 15–41)
Albumin: 3.7 g/dL (ref 3.5–5.0)
Alkaline Phosphatase: 73 U/L (ref 38–126)
Anion gap: 12 (ref 5–15)
BUN: 8 mg/dL (ref 8–23)
CO2: 22 mmol/L (ref 22–32)
Calcium: 9.3 mg/dL (ref 8.9–10.3)
Chloride: 105 mmol/L (ref 98–111)
Creatinine, Ser: 0.66 mg/dL (ref 0.44–1.00)
GFR, Estimated: >60 mL/min (ref >60)
Glucose, Bld: 90 mg/dL (ref 70–99)
Potassium: 4.2 mmol/L (ref 3.5–5.1)
Sodium: 139 mmol/L (ref 135–145)
Total Bilirubin: 0.6 mg/dL (ref 0.3–1.2)
Total Protein: 6.6 g/dL (ref 6.5–8.1)

## 2020-08-22 LAB — BLOOD GAS, ARTERIAL
Acid-base deficit: 0.4 mmol/L (ref 0.0–2.0)
Bicarbonate: 23.9 mmol/L (ref 20.0–28.0)
Drawn by: 602861
FIO2: 21
O2 Saturation: 96.8 %
Patient temperature: 37
pCO2 arterial: 40.1 mmHg (ref 32.0–48.0)
pH, Arterial: 7.393 (ref 7.350–7.450)
pO2, Arterial: 87.4 mmHg (ref 83.0–108.0)

## 2020-08-22 LAB — CBC
HCT: 37.7 % (ref 36.0–46.0)
Hemoglobin: 12.5 g/dL (ref 12.0–15.0)
MCH: 33.3 pg (ref 26.0–34.0)
MCHC: 33.2 g/dL (ref 30.0–36.0)
MCV: 100.5 fL — ABNORMAL HIGH (ref 80.0–100.0)
Platelets: 242 10*3/uL (ref 150–400)
RBC: 3.75 MIL/uL — ABNORMAL LOW (ref 3.87–5.11)
RDW: 12.2 % (ref 11.5–15.5)
WBC: 5.5 10*3/uL (ref 4.0–10.5)
nRBC: 0 % (ref 0.0–0.2)

## 2020-08-22 LAB — URINALYSIS, ROUTINE W REFLEX MICROSCOPIC
Bacteria, UA: NONE SEEN
Bilirubin Urine: NEGATIVE
Glucose, UA: NEGATIVE mg/dL
Ketones, ur: NEGATIVE mg/dL
Leukocytes,Ua: NEGATIVE
Nitrite: NEGATIVE
Protein, ur: NEGATIVE mg/dL
Specific Gravity, Urine: 1.02 (ref 1.005–1.030)
pH: 5 (ref 5.0–8.0)

## 2020-08-22 LAB — SURGICAL PCR SCREEN
MRSA, PCR: NEGATIVE
Staphylococcus aureus: NEGATIVE

## 2020-08-22 LAB — TYPE AND SCREEN
ABO/RH(D): O POS
Antibody Screen: NEGATIVE

## 2020-08-22 LAB — PROTIME-INR
INR: 1 (ref 0.8–1.2)
Prothrombin Time: 13 seconds (ref 11.4–15.2)

## 2020-08-22 LAB — SARS CORONAVIRUS 2 (TAT 6-24 HRS): SARS Coronavirus 2: NEGATIVE

## 2020-08-22 LAB — APTT: aPTT: 24 seconds (ref 24–36)

## 2020-08-22 NOTE — Progress Notes (Addendum)
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 She is s/p video bronchoscopy 07/18/20 with RUL FNA positive for non-small call carcinoma and LUL FNA consistent with adenocarcinoma. She was referred to CT surgery for lung cancer in bilateral upper lobes. Based on her lung function, Dr. Kipp Brood did not think she would tolerate bilateral upper lobectomies, so discussed RU lobectomy and SBRT to LUL.   History includes former smoker (quit 06/11/20), GERD, lung nodules.  Last evaluation by cardiologist Dr. Virgina Jock on 06/14/20 for follow-up chest pain with recent intermediate risk stress test followed by limited Cardiac CT with Calcium score of 0. There was in incidental finding of spiculated RUL lung nodule. Given ventricular bigeminy, he ordered an echo to rule out structural abnormality, with "No other treatment necessary at this time." Echo showed normal LVEF with normal wall motion, mild MR. He referred patient urgently to [place] for work-up of lung lesion, and recommended as needed cardiology follow-up.   - Given plans for lobectomy, Dr. Kipp Brood reached out to Dr. Virgina Jock. On 08/15/20 he wrote, "I reviewed her chart and stress test again. Very small perfusion defect seen on stress test likely attenuation. Calcium score is 0, making obstructive CAD exceedingly unlikely. Echocardiogram is unremarkable. I feel her perioperative cardiac risk is low. Okay to proceed with surgery as scheduled. Feel free to call me if you have any questions."  Second Pfizer COVID-19 vaccine 12/05/19.  08/22/20 presurgical COVID-19 test  negative.  Anesthesia team to evaluate on the day of surgery.  She is for chest x-ray on the day of surgery.   VS: BP 129/65   Pulse (!) 55   Temp 36.9 C (Oral)   Resp 18   Ht 5\' 4"  (1.626 m)   Wt 61.2 kg   SpO2 100%   BMI 23.17 kg/m   PROVIDERS: Lin Landsman, MD is PCP  June Leap, DO is pulmonologist   LABS: Labs reviewed: Acceptable for surgery. (all labs ordered are listed, but only abnormal results are displayed)  Labs Reviewed  CBC - Abnormal; Notable for the following components:      Result Value   RBC 3.75 (*)    MCV 100.5 (*)    All other components within normal limits  URINALYSIS, ROUTINE W REFLEX MICROSCOPIC - Abnormal; Notable for the following components:   Hgb urine dipstick SMALL (*)    All other components within normal limits  SURGICAL PCR SCREEN  COMPREHENSIVE METABOLIC PANEL  BLOOD GAS, ARTERIAL  PROTIME-INR  APTT  TYPE AND SCREEN    Pulmonary Functions Testing Results: PFT Results Latest Ref Rng & Units 06/24/2020  FVC-Pre L 1.59  FVC-Predicted Pre % 69  FVC-Post L 1.89  FVC-Predicted Post % 82  Pre FEV1/FVC % % 67  Post FEV1/FCV % % 68  FEV1-Pre L 1.06  FEV1-Predicted Pre % 59  FEV1-Post L 1.28  DLCO uncorrected ml/min/mmHg 10.29  DLCO UNC% % 53  DLVA Predicted % 85  TLC L 4.78  TLC % Predicted % 94  RV % Predicted % 142     IMAGES: For CXR on the day of surgery.  MRI Brain 08/16/20: IMPRESSION: Unremarkable appearance of the brain. No evidence of intracranial metastases.  PET Scan 07/03/20: IMPRESSION: 1. Abnormal hypermetabolic activity in the 8 mm left upper lobe pulmonary nodule and in the 1.7 by 1.0 cm sub solid right pulmonary nodule, concerning for multifocal malignancy. The other pulmonary nodules are not appreciably hypermetabolic, but are below sensitive PET-CT size thresholds. 2. Small but borderline hypermetabolic mediastinal lymph nodes along the left hilum and anterior to the SVC. 3. Other imaging  findings of potential clinical significance: Aortic Atherosclerosis (ICD10-I70.0) and Emphysema (ICD10-J43.9). Sludge or gallstones in the gallbladder. Nonobstructive left nephrolithiasis.  CT Super D Chest 07/03/20: IMPRESSION: 1. Enlarging spiculated solid nodule in the left upper lobe and enlarging part solid nodules in the right upper lobe, suspicious for malignancy. Referral to thoracic multi disciplinary clinic for further evaluation recommended. PET-CT may be helpful for further characterization of these nodules. 2. No evidence of metastatic disease. 3. Aortic Atherosclerosis (ICD10-I70.0) and Emphysema (ICD10-J43.9).   EKG: EKG 08/22/2020: Sinus bradycardia at 59 bpm ST & T wave abnormality, consider anterolateral ischemia Abnormal ECG - EKG appears stable when compared to 05/29/2020 tracing  EKG 05/29/2020: Sinus rhythm 69 bpm Old anteroseptal infarct Anterolateral T wave inversion, consider ischemia   CV: Echocardiogram 06/18/2020:  Left ventricle cavity is normal in size. Mild concentric hypertrophy of  the left ventricle. Normal LV systolic function with EF 57%. Normal global  wall motion. Normal diastolic filling pattern.  Left atrial cavity is mildly dilated.  Mild (Grade I) mitral regurgitation.Estimated RA pressure 8 mmHg.   CT Cardiac Calium Scoring 06/11/2020 ([facility]): EXAM: Limited Cardiac CT for calcium scoring.  FINDINGS:  TOTAL CALCIUM SCORE :  0  CARDIAC ANATOMY:Normal.  VISUALIZED THORAX:Emphysema. There is a 1.8 cm spiculated nodule in the lateral right upper lobe.  A punctate nonobstructing left renal calyceal calculus is noted. Partially imaged left renal cyst also seen.  IMPRESSION:  - The total coronary calcium score is 0.  - Suspicious 1.8 cm spiculated nodule in the right upper lobe is concerning for primary lung malignancy in the setting of emphysema. Recommend full CT scan of the chest with contrast to better evaluate.     Exercise Myoview stress test 06/10/2020: Exercise nuclear stress test was performed using Bruce protocol. Patient reached 7 METS, and 103% of age predicted maximum heart rate. Exercise capacity was fair. Chest pain not reported. Heart rate and hemodynamic response were normal. Stress EKG showed sinus tachycardia, ventricular bigeminy for 28 sec in stage 1/2, 1.5-2 mm horizontal ST depression in leads II, V4-V6, normalizing 2 min into recovery.  SPECT Images showed small sized mild intensity, reversible perfusion defect in basal anteroseptal myocardium. Stress LVEF 74%.  Intermediate risk study.    Carotid US 07/01/2012: Summary:  No significant extracranial carotid artery stenosis  demonstrated. Vertebrals demonstrate antegrade flow. Right  vertebral is atypical with loss of diastolic component,  suggestive of distal stenosis.    Past Medical History:  Diagnosis Date  . Adenoma of left adrenal gland   . Cancer ([place])    Lung  . Colon polyps    hyperplastic  . Complication of anesthesia    Very emotional and cries after anesthesia  . GERD (gastroesophageal reflux disease)   . Kidney stone   . Liver lesion   . Microhematuria     Past Surgical History:  Procedure Laterality Date  . ABDOMINAL HYSTERECTOMY    . APPENDECTOMY    . BRONCHIAL BIOPSY  07/18/2020   Procedure:  BRONCHIAL BIOPSIES;  Surgeon: [name], DO;  Location: [facility];  Service: Pulmonary;;  . BRONCHIAL BRUSHINGS  07/18/2020   Procedure: BRONCHIAL BRUSHINGS;  Surgeon: [name], DO;  Location: [facility];  Service: Pulmonary;;  . BRONCHIAL NEEDLE ASPIRATION BIOPSY  07/18/2020   Procedure: BRONCHIAL NEEDLE ASPIRATION BIOPSIES;  Surgeon: [name], DO;  Location: [facility];  Service: Pulmonary;;  . BRONCHIAL WASHINGS  07/18/2020   Procedure: BRONCHIAL WASHINGS;  Surgeon: [name], DO;  Location: [facility];  Service: Pulmonary;;  . FIDUCIAL MARKER PLACEMENT   07/18/2020   Procedure: FIDUCIAL MARKER PLACEMENT;  Surgeon: [name], DO;  Location: [facility];  Service: Pulmonary;;  . VIDEO BRONCHOSCOPY WITH ENDOBRONCHIAL NAVIGATION N/A 07/18/2020   Procedure: VIDEO BRONCHOSCOPY WITH ENDOBRONCHIAL NAVIGATION;  Surgeon: [name], DO;  Location: [facility];  Service: Pulmonary;  Laterality: N/A;  . VIDEO BRONCHOSCOPY WITH ENDOBRONCHIAL ULTRASOUND  07/18/2020   Procedure: VIDEO BRONCHOSCOPY WITH ENDOBRONCHIAL ULTRASOUND;  Surgeon: [name], DO;  Location: [facility];  Service: Pulmonary;;    MEDICATIONS: . albuterol (VENTOLIN HFA) 108 (90 Base) MCG/ACT inhaler  . alendronate (FOSAMAX) 70 MG tablet  . ALPRAZolam (XANAX) 1 MG tablet  . Calcium Carbonate-Vitamin D (CALCIUM + D PO)  . cyclobenzaprine (FLEXERIL) 5 MG tablet  . gabapentin (NEURONTIN) 100 MG capsule  . lidocaine (LIDODERM) 5 %  . Multiple Vitamin (MULTIVITAMIN WITH MINERALS) TABS  . naproxen (NAPROSYN) 375 MG tablet  . omeprazole (PRILOSEC) 40 MG capsule  . oxyCODONE-acetaminophen (PERCOCET) 10-325 MG tablet  . rosuvastatin (CRESTOR) 20 MG tablet  . Tiotropium Bromide-Olodaterol (STIOLTO RESPIMAT) 2.5-2.5 MCG/ACT AERS   No current facility-administered medications for this encounter.    Myra Gianotti, PA-C Surgical Short Stay/Anesthesiology [facility] Phone [phone] [facility] Phone [phone] 08/22/2020 6:41 PM

## 2020-08-22 NOTE — Progress Notes (Signed)
PCP - Dr. Lin Landsman  Cardiologist - Dr. Eyvonne Left. 08/15/20  Chest x-ray - DOS- 08/26/20  EKG - 08/22/20  Stress Test - 06/10/20 (E)  ECHO - 06/18/20 (E)  Cardiac Cath - Denies  AICD-na PM-na LOOP-na  Sleep Study - Denies CPAP - Denies  LABS- 08/22/20: CBC, CMP, PT, PTT, ABG, T/S, UA, PCR, COVID  ASA- Denies  ERAS- No  HA1C- Denies  Anesthesia- Yes- C.C. 08/15/20  Pt denies having chest pain, sob, or fever at this time. All instructions explained to the pt, with a verbal understanding of the material. Pt agrees to go over the instructions while at home for a better understanding. Pt also instructed to self quarantine after being tested for COVID-19. The opportunity to ask questions was provided.   Coronavirus Screening  Have you experienced the following symptoms:  Cough yes/no: No Fever (>100.80F)  yes/no: No Runny nose yes/no: No Sore throat yes/no: No Difficulty breathing/shortness of breath  yes/no: No  Have you or a family member traveled in the last 14 days and where? yes/no: No   If the patient indicates "YES" to the above questions, their PAT will be rescheduled to limit the exposure to others and, the surgeon will be notified. THE PATIENT WILL NEED TO BE ASYMPTOMATIC FOR 14 DAYS.   If the patient is not experiencing any of these symptoms, the PAT nurse will instruct them to NOT bring anyone with them to their appointment since they may have these symptoms or traveled as well.   Please remind your patients and families that hospital visitation restrictions are in effect and the importance of the restrictions.

## 2020-08-22 NOTE — Progress Notes (Signed)
Synopsis: Referred in Oct 2021 for lung nodule by Lin Landsman, MD  Subjective:   PATIENT ID: Tonya [name] GENDER: female DOB: 1945/10/17, MRN: [id number]  Chief Complaint  Patient presents with  . Follow-up    Pt is scheduled for surgery on 12/20.  Pt stated that she is doing ok    This is a 74 year old female, former smoker, quit last week, smoked for 60+ years at approximately 1/3 to 1/2 pack/day.  She recently went to the emergency department for evaluation for chest pain.  She was referred to cardiology.  She had a coronary calcium CT scan that was completed at [facility] imaging.  Record available for review in epic care everywhere.  This was completed on 06/11/2020.  This revealed a 1.8 cm spiculated nodule in the right upper lobe concerning for primary bronchogenic carcinoma.  I am unable to review the images today but I can review the report.  I discussed this with the patient today in the office in detail.  As for her symptoms her chest pain has since resolved.  She also had an echocardiogram completed recently with grade 1 diastolic dysfunction.  She is following with Dr. Fraser Din from [facility] cardiology.  From a respiratory standpoint she does have some dyspnea on exertion.  She is not had any previous maintenance inhalers for COPD.  She does have evidence emphysema on her coronary CT scan as per her report.  No family history of lung cancer..  OV 07/11/2020: Patient here today for follow-up after recent nuclear medicine pet imaging.  As well as super D CT imaging.  Patient found to have a new left upper lobe lesion as well as a right upper lobe lesion.  The new left upper lobe lesion was found in this process of work-up.  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  We discussed this today in the office.  Patient is very anxious about all of this new information.  Denies fevers night sweats weight loss.  OV 08/22/2020: Patient here today for follow-up after bronchoscopy and  surgery consultation.  Surgery consultation reviewed and discussed with Dr. Kipp Brood.  Actually spoke with Dr. Kipp Brood this morning to discuss case.  Patient has plans for resection of right-sided malignancy.  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  MRI of the brain negative results reviewed today in the office.  Patient has plans for resection of the right side and likely SBRT to the left.  Patient is somewhat anxious for her planned procedure but is ready to get this process moving forward and over with.   Past Medical History:  Diagnosis Date  . Adenoma of left adrenal gland   . Colon polyps    hyperplastic  . GERD (gastroesophageal reflux disease)   . History of kidney stones   . Kidney stone   . Liver lesion   . Microhematuria      Family History  Problem Relation Age of Onset  . Arrhythmia Mother        s/p Pacer   . Heart attack Father   . Heart disease Sister   . Colon cancer Neg Hx      Past Surgical History:  Procedure Laterality Date  . ABDOMINAL HYSTERECTOMY    . APPENDECTOMY    . BRONCHIAL BIOPSY  07/18/2020   Procedure: BRONCHIAL BIOPSIES;  Surgeon: [name], DO;  Location: [facility];  Service: Pulmonary;;  . BRONCHIAL BRUSHINGS  07/18/2020   Procedure: BRONCHIAL BRUSHINGS;  Surgeon: [name],  DO;  Location: [facility];  Service: Pulmonary;;  . BRONCHIAL NEEDLE ASPIRATION BIOPSY  07/18/2020   Procedure: BRONCHIAL NEEDLE ASPIRATION BIOPSIES;  Surgeon: [name], DO;  Location: [facility];  Service: Pulmonary;;  . BRONCHIAL WASHINGS  07/18/2020   Procedure: BRONCHIAL WASHINGS;  Surgeon: [name], DO;  Location: [facility];  Service: Pulmonary;;  . FIDUCIAL MARKER PLACEMENT  07/18/2020   Procedure: FIDUCIAL MARKER PLACEMENT;  Surgeon: [name], DO;  Location: [facility];  Service: Pulmonary;;  . VIDEO BRONCHOSCOPY WITH ENDOBRONCHIAL NAVIGATION N/A  07/18/2020   Procedure: VIDEO BRONCHOSCOPY WITH ENDOBRONCHIAL NAVIGATION;  Surgeon: [name], DO;  Location: [facility];  Service: Pulmonary;  Laterality: N/A;  . VIDEO BRONCHOSCOPY WITH ENDOBRONCHIAL ULTRASOUND  07/18/2020   Procedure: VIDEO BRONCHOSCOPY WITH ENDOBRONCHIAL ULTRASOUND;  Surgeon: [name], DO;  Location: [place];  Service: Pulmonary;;    Social History   Socioeconomic History  . Marital status: Married    Spouse name: Not on file  . Number of children: 3  . Years of education: Not on file  . Highest education level: Not on file  Occupational History  . Not on file  Tobacco Use  . Smoking status: Former Smoker    Packs/day: 0.30    Years: 60.00    Pack years: 18.00    Types: Cigarettes    Quit date: 06/11/2020    Years since quitting: 0.1  . Smokeless tobacco: Never Used  Vaping Use  . Vaping Use: Never used  Substance and Sexual Activity  . Alcohol use: Yes    Comment: 0.5 drink per month  . Drug use: No  . Sexual activity: Not Currently    Birth control/protection: Surgical    Comment: Hysterectomy  Other Topics Concern  . 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    Social Determinants of Health   Financial Resource Strain: Not on file  Food Insecurity: Not on file  Transportation Needs: Not on file  Physical Activity: Not on file  Stress: Not on file  Social Connections: Not on file  Intimate Partner Violence: Not on file     Allergies  Allergen Reactions  . Penicillins Hives    REACTION: 20 years     Outpatient Medications Prior to Visit  Medication Sig Dispense Refill  . albuterol (VENTOLIN HFA) 108 (90 Base) MCG/ACT inhaler Inhale 1-2 puffs into the lungs every 4 (four) hours as needed for wheezing or shortness of breath.     Marland Kitchen alendronate (FOSAMAX) 70 MG tablet Take 70 mg by mouth every Saturday.    . ALPRAZolam (XANAX) 1 MG tablet Take 1 mg by mouth 3 (three) times daily as needed for  anxiety or sleep.    . Calcium Carbonate-Vitamin D (CALCIUM + D PO) Take 600 mg by mouth 2 (two) times daily.    . cyclobenzaprine (FLEXERIL) 5 MG tablet Take 5 mg by mouth 3 (three) times daily as needed for muscle spasms (sciatic nerve).     . gabapentin (NEURONTIN) 100 MG capsule Take 100 mg by mouth at bedtime.    . lidocaine (LIDODERM) 5 % Place 1 patch onto the skin daily as needed. Apply patch to area most significant pain once per day.  Remove and discard patch within 12 hours of application. (Patient taking differently: Place 1 patch onto the skin daily as needed (Pain). Apply patch to area most significant pain once per day.  Remove and discard patch  within 12 hours of application.) 30 patch 0  . Multiple Vitamin (MULTIVITAMIN WITH MINERALS) TABS Take 1 tablet by mouth daily.    . naproxen (NAPROSYN) 375 MG tablet Take 1 tablet (375 mg total) by mouth 2 (two) times daily as needed for moderate pain. 8 tablet 0  . omeprazole (PRILOSEC) 40 MG capsule Take 40 mg by mouth 2 (two) times daily.    Marland Kitchen oxyCODONE-acetaminophen (PERCOCET) 10-325 MG tablet Take 1 tablet by mouth every 4 (four) hours as needed for moderate pain.     . rosuvastatin (CRESTOR) 20 MG tablet Take 1 tablet (20 mg total) by mouth daily. (Patient taking differently: Take 10 mg by mouth at bedtime.) 30 tablet 0  . Tiotropium Bromide-Olodaterol (STIOLTO RESPIMAT) 2.5-2.5 MCG/ACT AERS Inhale 2 puffs into the lungs daily. (Patient taking differently: Inhale 2 puffs into the lungs daily as needed (Shortness of breath).) 4 g 0   No facility-administered medications prior to visit.    Review of Systems  Constitutional: Negative for chills, fever, malaise/fatigue and weight loss.  HENT: Negative for hearing loss, sore throat and tinnitus.   Eyes: Negative for blurred vision and double vision.  Respiratory: Negative for cough, hemoptysis, sputum production, shortness of breath, wheezing and stridor.   Cardiovascular: Negative for  chest pain, palpitations, orthopnea, leg swelling and PND.  Gastrointestinal: Negative for abdominal pain, constipation, diarrhea, heartburn, nausea and vomiting.  Genitourinary: Negative for dysuria, hematuria and urgency.  Musculoskeletal: Negative for joint pain and myalgias.  Skin: Negative for itching and rash.  Neurological: Negative for dizziness, tingling, weakness and headaches.  Endo/Heme/Allergies: Negative for environmental allergies. Does not bruise/bleed easily.  Psychiatric/Behavioral: Negative for depression. The patient is not nervous/anxious and does not have insomnia.   All other systems reviewed and are negative.    Objective:  Physical Exam Vitals reviewed.  Constitutional:      General: She is not in acute distress.    Appearance: She is well-developed and well-nourished.  HENT:     Head: Normocephalic and atraumatic.     Mouth/Throat:     Mouth: Oropharynx is clear and moist.  Eyes:     General: No scleral icterus.    Conjunctiva/sclera: Conjunctivae normal.     Pupils: Pupils are equal, round, and reactive to light.  Neck:     Vascular: No JVD.     Trachea: No tracheal deviation.  Cardiovascular:     Rate and Rhythm: Normal rate and regular rhythm.     Pulses: Intact distal pulses.     Heart sounds: Normal heart sounds. No murmur heard.   Pulmonary:     Effort: Pulmonary effort is normal. No tachypnea, accessory muscle usage or respiratory distress.     Breath sounds: Normal breath sounds. No stridor. No wheezing, rhonchi or rales.  Abdominal:     General: Bowel sounds are normal. There is no distension.     Palpations: Abdomen is soft.     Tenderness: There is no abdominal tenderness.  Musculoskeletal:        General: No tenderness or edema.     Cervical back: Neck supple.  Lymphadenopathy:     Cervical: No cervical adenopathy.  Skin:    General: Skin is warm and dry.     Capillary Refill: Capillary refill takes less than 2 seconds.      Findings: No rash.  Neurological:     Mental Status: She is alert and oriented to person, place, and time.  Psychiatric:  Mood and Affect: Mood and affect normal.        Behavior: Behavior normal.      Vitals:   08/22/20 0940  BP: 110/62  Pulse: 61  Temp: 98 F (36.7 C)  TempSrc: Tympanic  SpO2: 95%  Weight: 135 lb 8 oz (61.5 kg)  Height: _0  (1.626 m)   95% on RA BMI Readings from Last 3 Encounters:  08/22/20 23.26 kg/m  08/09/20 23.17 kg/m  07/17/20 22.83 kg/m   Wt Readings from Last 3 Encounters:  08/22/20 135 lb 8 oz (61.5 kg)  08/09/20 135 lb (61.2 kg)  07/17/20 133 lb (60.3 kg)     CBC    Component Value Date/Time   WBC 4.9 07/11/2020 0958   RBC 3.85 (L) 07/11/2020 0958   HGB 12.6 07/11/2020 0958   HCT 38.0 07/11/2020 0958   PLT 200.0 07/11/2020 0958   MCV 98.7 07/11/2020 0958   MCH 32.4 04/01/2020 1706   MCHC 33.2 07/11/2020 0958   RDW 13.1 07/11/2020 0958   LYMPHSABS 6.4 (H) 09/03/2016 1950   MONOABS 0.9 09/03/2016 1950   EOSABS 0.1 09/03/2016 1950   BASOSABS 0.0 09/03/2016 1950     Chest Imaging: 06/11/2020 coronary calcium CT scan images: Report reviewed in epic Care Everywhere A right upper lobe 1.8 cm lung nodule concerning for primary bronchogenic carcinoma. The patient's images have been independently reviewed by me.    Pulmonary Functions Testing Results: PFT Results Latest Ref Rng & Units 06/24/2020  FVC-Pre L 1.59  FVC-Predicted Pre % 69  FVC-Post L 1.89  FVC-Predicted Post % 82  Pre FEV1/FVC % % 67  Post FEV1/FCV % % 68  FEV1-Pre L 1.06  FEV1-Predicted Pre % 59  FEV1-Post L 1.28  DLCO uncorrected ml/min/mmHg 10.29  DLCO UNC% % 53  DLVA Predicted % 85  TLC L 4.78  TLC % Predicted % 94  RV % Predicted % 142    FeNO:   Pathology:   Echocardiogram:   Heart Catheterization:     Assessment & Plan:     ICD-10-CM   1. Adenocarcinoma of lung, stage 1, right (HCC)  C34.91   2. Abnormal PET scan of lung  R94.2    3. Former smoker  Z87.891   4. Centrilobular emphysema ([place])  J43.2   5. Adenocarcinoma of left lung, stage 1 (HCC)  C34.92     Discussion:  This is a 74 year old female, former smoker, 60+ pack year history, bilateral right upper lobe and left upper lobe abnormal pet imaging, recent navigational bronchoscopy with positive tissue diagnosis for adenocarcinoma.  Both with stage I malignancies in the bilateral upper lobes.  Plan: Patient has recently met with thoracic surgery Dr. Kipp Brood with plans for resection of the right side. This is planned for Monday of next week.  Patient is little bit anxious regarding this but overall looking forward to having this process started and over with.  Referral placed to radiation oncology for SBRT to left adenocarcinoma.  We will see patient postoperatively. Continue bronchodilators postoperatively for COPD management. Continue as needed albuterol for shortness of breath and wheezing.     Current Outpatient Medications:  .  albuterol (VENTOLIN HFA) 108 (90 Base) MCG/ACT inhaler, Inhale 1-2 puffs into the lungs every 4 (four) hours as needed for wheezing or shortness of breath. , Disp: , Rfl:  .  alendronate (FOSAMAX) 70 MG tablet, Take 70 mg by mouth every Saturday., Disp: , Rfl:  .  ALPRAZolam (XANAX) 1 MG tablet,  Take 1 mg by mouth 3 (three) times daily as needed for anxiety or sleep., Disp: , Rfl:  .  Calcium Carbonate-Vitamin D (CALCIUM + D PO), Take 600 mg by mouth 2 (two) times daily., Disp: , Rfl:  .  cyclobenzaprine (FLEXERIL) 5 MG tablet, Take 5 mg by mouth 3 (three) times daily as needed for muscle spasms (sciatic nerve). , Disp: , Rfl:  .  gabapentin (NEURONTIN) 100 MG capsule, Take 100 mg by mouth at bedtime., Disp: , Rfl:  .  lidocaine (LIDODERM) 5 %, Place 1 patch onto the skin daily as needed. Apply patch to area most significant pain once per day.  Remove and discard patch within 12 hours of application. (Patient taking  differently: Place 1 patch onto the skin daily as needed (Pain). Apply patch to area most significant pain once per day.  Remove and discard patch within 12 hours of application.), Disp: 30 patch, Rfl: 0 .  Multiple Vitamin (MULTIVITAMIN WITH MINERALS) TABS, Take 1 tablet by mouth daily., Disp: , Rfl:  .  naproxen (NAPROSYN) 375 MG tablet, Take 1 tablet (375 mg total) by mouth 2 (two) times daily as needed for moderate pain., Disp: 8 tablet, Rfl: 0 .  omeprazole (PRILOSEC) 40 MG capsule, Take 40 mg by mouth 2 (two) times daily., Disp: , Rfl:  .  oxyCODONE-acetaminophen (PERCOCET) 10-325 MG tablet, Take 1 tablet by mouth every 4 (four) hours as needed for moderate pain. , Disp: , Rfl:  .  rosuvastatin (CRESTOR) 20 MG tablet, Take 1 tablet (20 mg total) by mouth daily. (Patient taking differently: Take 10 mg by mouth at bedtime.), Disp: 30 tablet, Rfl: 0 .  Tiotropium Bromide-Olodaterol (STIOLTO RESPIMAT) 2.5-2.5 MCG/ACT AERS, Inhale 2 puffs into the lungs daily. (Patient taking differently: Inhale 2 puffs into the lungs daily as needed (Shortness of breath).), Disp: 4 g, Rfl: 0    [name], DO [facility] 08/22/2020 9:54 AM

## 2020-08-22 NOTE — Anesthesia Preprocedure Evaluation (Addendum)
Anesthesia Evaluation  Patient identified by MRN, date of birth, ID band Patient awake    Reviewed: Allergy & Precautions, NPO status , Patient's Chart, lab work & pertinent test results  Airway Mallampati: II  TM Distance: >3 FB Neck ROM: Full    Dental  (+) Partial Upper, Partial Lower   Pulmonary former smoker,  RUL Cancer    Pulmonary exam normal breath sounds clear to auscultation       Cardiovascular negative cardio ROS Normal cardiovascular exam Rhythm:Regular Rate:Normal  Echocardiogram 06/18/2020: Left ventricle cavity is normal in size. Mild concentric hypertrophy of  the left ventricle. Normal LV systolic function with EF 57%. Normal global  wall motion. Normal diastolic filling pattern.  Left atrial cavity is mildly dilated.  Mild (Grade I) mitral regurgitation.Estimated RA pressure 8 mmHg.   Neuro/Psych Sciatica requiring narcotic use negative neurological ROS  negative psych ROS   GI/Hepatic Neg liver ROS, GERD  Medicated,  Endo/Other  negative endocrine ROS  Renal/GU negative Renal ROS     Musculoskeletal negative musculoskeletal ROS (+)   Abdominal   Peds  Hematology negative hematology ROS (+)   Anesthesia Other Findings   Reproductive/Obstetrics                          Anesthesia Physical Anesthesia Plan  ASA: II  Anesthesia Plan: General   Post-op Pain Management:    Induction: Intravenous  PONV Risk Score and Plan: 3 and Midazolam, Dexamethasone and Ondansetron  Airway Management Planned: Double Lumen EBT  Additional Equipment: Arterial line, CVP and Ultrasound Guidance Line Placement  Intra-op Plan:   Post-operative Plan: Extubation in OR  Informed Consent: I have reviewed the patients History and Physical, chart, labs and discussed the procedure including the risks, benefits and alternatives for the proposed anesthesia with the patient or authorized  representative who has indicated his/her understanding and acceptance.       Plan Discussed with: CRNA  Anesthesia Plan Comments: (PAT note written 08/22/2020 by Myra Gianotti, PA-C. )     Anesthesia Quick Evaluation

## 2020-08-22 NOTE — Patient Instructions (Signed)
Thank you for visiting Dr. Valeta Harms at [facility]. Today we recommend the following:  Goodluck on your surgery next week!   Return in about 8 weeks (around 10/17/2020) for w/ Dr. Valeta Harms .    Please do your part to reduce the spread of COVID-19.

## 2020-08-22 NOTE — Pre-Procedure Instructions (Signed)
Your procedure is scheduled on Mon., Dec. 20, 2021 from 7:30AM-11:00AM.  Report to [facility] Entrance "A" at 5:30AM  Call this number if you have problems the morning of surgery:  [phone]   Remember:  Do not eat or drink after midnight on Dec. 19th    Take these medicines the morning of surgery with A SIP OF WATER: Omeprazole (PRILOSEC)   If Needed:  ALPRAZolam Duanne Moron) OxyCODONE-acetaminophen (PERCOCET) Albuterol (VENTOLIN HFA)- bring day of surgery Tiotropium Bromide-Olodaterol (STIOLTO RESPIMAT)-  bring day of surgery  As of today, STOP taking all Aspirin (unless instructed by your doctor) and Other Aspirin containing products, Vitamins, Fish oils, and Herbal medications. Also stop all NSAIDS i.e. Advil, Ibuprofen, Motrin, Aleve, Anaprox, Naproxen, BC, Goody Powders, and all Supplements.   No Smoking of any kind, Tobacco/Vaping, or Alcohol products 24 hours prior to your procedure. If you use a Cpap at night, you may bring all equipment for your overnight stay.   Special instructions:   [facility]- Preparing For Surgery  Before surgery, you can play an important role. Because skin is not sterile, your skin needs to be as free of germs as possible. You can reduce the number of germs on your skin by washing with CHG (chlorahexidine gluconate) Soap before surgery.  CHG is an antiseptic cleaner which kills germs and bonds with the skin to continue killing germs even after washing.    Please do not use if you have an allergy to CHG or antibacterial soaps. If your skin becomes reddened/irritated stop using the CHG.  Do not shave (including legs and underarms) for at least 48 hours prior to first CHG shower. It is OK to shave your face.  Please follow these instructions carefully.   1. Shower the NIGHT BEFORE SURGERY and the MORNING OF SURGERY with CHG.   2. If you chose to wash your hair, wash your hair first as usual with your normal shampoo.  3. After you  shampoo, rinse your hair and body thoroughly to remove the shampoo.  4. Use CHG as you would any other liquid soap. You can apply CHG directly to the skin and wash gently with a scrungie or a clean washcloth.   5. Apply the CHG Soap to your body ONLY FROM THE NECK DOWN.  Do not use on open wounds or open sores. Avoid contact with your eyes, ears, mouth and genitals (private parts). Wash Face and genitals (private parts)  with your normal soap.  6. Wash thoroughly, paying special attention to the area where your surgery will be performed.  7. Thoroughly rinse your body with warm water from the neck down.  8. DO NOT shower/wash with your normal soap after using and rinsing off the CHG Soap.  9. Pat yourself dry with a CLEAN TOWEL.  10. Wear CLEAN PAJAMAS to bed the night before surgery, wear comfortable clothes the morning of surgery  11. Place CLEAN SHEETS on your bed the night of your first shower and DO NOT SLEEP WITH PETS.   Day of Surgery:             Remember to brush your teeth WITH YOUR REGULAR TOOTHPASTE.  Do not wear jewelry, make-up or nail polish.  Do not wear lotions, powders, or perfumes, or deodorant.  Do not shave 48 hours prior to surgery.    Do not bring valuables to the hospital.  [facility] is not responsible for any belongings or valuables.  Contacts, dentures or bridgework may not  be worn into surgery.    For patients admitted to the hospital, discharge time will be determined by your treatment team.  Patients discharged the day of surgery will not be allowed to drive home, and someone age 72 and over needs to stay with them for 24 hours.  Please wear clean clothes to the hospital/surgery center.    Please read over the following fact sheets that you were given.

## 2020-08-23 ENCOUNTER — Encounter: Payer: Self-pay | Admitting: *Deleted

## 2020-08-23 NOTE — Progress Notes (Signed)
I received a call from rad onc scheduling team.  They were confused about when to schedule due to her surgery on 12/20.  I explained and asked that they call and schedule her to be seen approximately 3 weeks after surgery.

## 2020-08-26 ENCOUNTER — Inpatient Hospital Stay (HOSPITAL_COMMUNITY): Payer: Medicare HMO

## 2020-08-26 ENCOUNTER — Inpatient Hospital Stay (HOSPITAL_COMMUNITY): Payer: Medicare HMO | Admitting: Vascular Surgery

## 2020-08-26 ENCOUNTER — Encounter (HOSPITAL_COMMUNITY)
Admission: RE | Disposition: A | Discharge status: Home / Self Care | Payer: Self-pay | Source: Home / Self Care | Attending: Thoracic Surgery (Cardiothoracic Vascular Surgery)

## 2020-08-26 ENCOUNTER — Inpatient Hospital Stay (HOSPITAL_COMMUNITY): Payer: Medicare HMO | Admitting: Anesthesiology

## 2020-08-26 ENCOUNTER — Inpatient Hospital Stay (HOSPITAL_COMMUNITY)
Admission: RE | Admit: 2020-08-26 | Discharge: 2020-08-29 | DRG: 164 | Disposition: A | Discharge status: Home / Self Care | Payer: Medicare HMO | Attending: Thoracic Surgery (Cardiothoracic Vascular Surgery) | Admitting: Thoracic Surgery (Cardiothoracic Vascular Surgery)

## 2020-08-26 DIAGNOSIS — I4891 Unspecified atrial fibrillation: Secondary | ICD-10-CM | POA: Diagnosis not present

## 2020-08-26 DIAGNOSIS — Z7983 Long term (current) use of bisphosphonates: Secondary | ICD-10-CM

## 2020-08-26 DIAGNOSIS — Z8249 Family history of ischemic heart disease and other diseases of the circulatory system: Secondary | ICD-10-CM | POA: Diagnosis not present

## 2020-08-26 DIAGNOSIS — Z880 Allergy status to penicillin: Secondary | ICD-10-CM

## 2020-08-26 DIAGNOSIS — K219 Gastro-esophageal reflux disease without esophagitis: Secondary | ICD-10-CM | POA: Diagnosis present

## 2020-08-26 DIAGNOSIS — Z87891 Personal history of nicotine dependence: Secondary | ICD-10-CM | POA: Diagnosis not present

## 2020-08-26 DIAGNOSIS — J90 Pleural effusion, not elsewhere classified: Secondary | ICD-10-CM | POA: Diagnosis not present

## 2020-08-26 DIAGNOSIS — Z902 Acquired absence of lung [part of]: Secondary | ICD-10-CM

## 2020-08-26 DIAGNOSIS — Z01818 Encounter for other preprocedural examination: Secondary | ICD-10-CM

## 2020-08-26 DIAGNOSIS — C3412 Malignant neoplasm of upper lobe, left bronchus or lung: Secondary | ICD-10-CM | POA: Diagnosis present

## 2020-08-26 DIAGNOSIS — Z4682 Encounter for fitting and adjustment of non-vascular catheter: Secondary | ICD-10-CM

## 2020-08-26 DIAGNOSIS — R918 Other nonspecific abnormal finding of lung field: Secondary | ICD-10-CM | POA: Insufficient documentation

## 2020-08-26 DIAGNOSIS — Z79899 Other long term (current) drug therapy: Secondary | ICD-10-CM

## 2020-08-26 DIAGNOSIS — C3411 Malignant neoplasm of upper lobe, right bronchus or lung: Secondary | ICD-10-CM | POA: Diagnosis present

## 2020-08-26 HISTORY — PX: NODE DISSECTION: SHX5269

## 2020-08-26 HISTORY — PX: INTERCOSTAL NERVE BLOCK: SHX5021

## 2020-08-26 LAB — ABO/RH: ABO/RH(D): O POS

## 2020-08-26 SURGERY — LOBECTOMY, LUNG, ROBOT-ASSISTED, USING VATS
Anesthesia: General | Site: Chest | Laterality: Right

## 2020-08-26 MED ORDER — KETOROLAC TROMETHAMINE 30 MG/ML IJ SOLN
30.0000 mg | Freq: Once | INTRAMUSCULAR | Status: AC
  Administered 2020-08-26: 30 mg via INTRAVENOUS

## 2020-08-26 MED ORDER — ROCURONIUM BROMIDE 10 MG/ML (PF) SYRINGE
PREFILLED_SYRINGE | INTRAVENOUS | Status: AC
  Filled 2020-08-26: qty 10

## 2020-08-26 MED ORDER — ACETAMINOPHEN 500 MG PO TABS
1000.0000 mg | ORAL_TABLET | Freq: Once | ORAL | Status: AC
  Administered 2020-08-26: 1000 mg via ORAL
  Filled 2020-08-26: qty 2

## 2020-08-26 MED ORDER — HEMOSTATIC AGENTS (NO CHARGE) OPTIME
TOPICAL | Status: DC | PRN
  Administered 2020-08-26: 1 via TOPICAL

## 2020-08-26 MED ORDER — LIDOCAINE 2% (20 MG/ML) 5 ML SYRINGE
INTRAMUSCULAR | Status: DC | PRN
  Administered 2020-08-26: 60 mg via INTRAVENOUS

## 2020-08-26 MED ORDER — ROSUVASTATIN CALCIUM 5 MG PO TABS
10.0000 mg | ORAL_TABLET | Freq: Every day | ORAL | Status: DC
  Administered 2020-08-26 – 2020-08-28 (×3): 10 mg via ORAL
  Filled 2020-08-26 (×3): qty 2

## 2020-08-26 MED ORDER — KETOROLAC TROMETHAMINE 30 MG/ML IJ SOLN
30.0000 mg | Freq: Four times a day (QID) | INTRAMUSCULAR | Status: DC | PRN
  Administered 2020-08-26 – 2020-08-27 (×2): 30 mg via INTRAVENOUS
  Filled 2020-08-26 (×2): qty 1

## 2020-08-26 MED ORDER — VANCOMYCIN HCL IN DEXTROSE 1-5 GM/200ML-% IV SOLN
1000.0000 mg | INTRAVENOUS | Status: AC
  Administered 2020-08-26: 1000 mg via INTRAVENOUS
  Filled 2020-08-26: qty 200

## 2020-08-26 MED ORDER — ALPRAZOLAM 0.5 MG PO TABS
1.0000 mg | ORAL_TABLET | Freq: Three times a day (TID) | ORAL | Status: DC | PRN
  Administered 2020-08-26 – 2020-08-28 (×3): 1 mg via ORAL
  Filled 2020-08-26 (×3): qty 2

## 2020-08-26 MED ORDER — FENTANYL CITRATE (PF) 100 MCG/2ML IJ SOLN
25.0000 ug | INTRAMUSCULAR | Status: DC | PRN
  Administered 2020-08-26: 25 ug via INTRAVENOUS

## 2020-08-26 MED ORDER — FENTANYL CITRATE (PF) 250 MCG/5ML IJ SOLN
INTRAMUSCULAR | Status: AC
  Filled 2020-08-26: qty 5

## 2020-08-26 MED ORDER — VANCOMYCIN HCL IN DEXTROSE 1-5 GM/200ML-% IV SOLN
1000.0000 mg | Freq: Two times a day (BID) | INTRAVENOUS | Status: AC
  Administered 2020-08-26: 1000 mg via INTRAVENOUS
  Filled 2020-08-26: qty 200

## 2020-08-26 MED ORDER — 0.9 % SODIUM CHLORIDE (POUR BTL) OPTIME
TOPICAL | Status: DC | PRN
  Administered 2020-08-26: 2000 mL

## 2020-08-26 MED ORDER — CHLORHEXIDINE GLUCONATE 0.12 % MT SOLN
15.0000 mL | Freq: Once | OROMUCOSAL | Status: AC
  Administered 2020-08-26: 15 mL via OROMUCOSAL
  Filled 2020-08-26: qty 15

## 2020-08-26 MED ORDER — METOCLOPRAMIDE HCL 5 MG/ML IJ SOLN
10.0000 mg | Freq: Four times a day (QID) | INTRAMUSCULAR | Status: AC
  Administered 2020-08-26 – 2020-08-27 (×4): 10 mg via INTRAVENOUS
  Filled 2020-08-26 (×3): qty 2

## 2020-08-26 MED ORDER — MIDAZOLAM HCL 2 MG/2ML IJ SOLN
INTRAMUSCULAR | Status: DC | PRN
  Administered 2020-08-26: 2 mg via INTRAVENOUS

## 2020-08-26 MED ORDER — PANTOPRAZOLE SODIUM 40 MG PO TBEC
80.0000 mg | DELAYED_RELEASE_TABLET | Freq: Every day | ORAL | Status: DC
  Administered 2020-08-27 – 2020-08-28 (×2): 80 mg via ORAL
  Filled 2020-08-26 (×2): qty 2

## 2020-08-26 MED ORDER — DEXAMETHASONE SODIUM PHOSPHATE 10 MG/ML IJ SOLN
INTRAMUSCULAR | Status: DC | PRN
  Administered 2020-08-26: 5 mg via INTRAVENOUS

## 2020-08-26 MED ORDER — LACTATED RINGERS IV SOLN: INTRAVENOUS | Status: DC | PRN

## 2020-08-26 MED ORDER — BISACODYL 5 MG PO TBEC
10.0000 mg | DELAYED_RELEASE_TABLET | Freq: Every day | ORAL | Status: DC
  Filled 2020-08-26: qty 2

## 2020-08-26 MED ORDER — SODIUM CHLORIDE FLUSH 0.9 % IV SOLN
INTRAVENOUS | Status: DC | PRN
  Administered 2020-08-26: 100 mL

## 2020-08-26 MED ORDER — BUPIVACAINE LIPOSOME 1.3 % IJ SUSP
20.0000 mL | Freq: Once | INTRAMUSCULAR | Status: DC
  Filled 2020-08-26: qty 20

## 2020-08-26 MED ORDER — ACETAMINOPHEN 500 MG PO TABS
1000.0000 mg | ORAL_TABLET | Freq: Four times a day (QID) | ORAL | Status: DC
  Administered 2020-08-26 – 2020-08-28 (×5): 1000 mg via ORAL
  Filled 2020-08-26 (×6): qty 2

## 2020-08-26 MED ORDER — GABAPENTIN 100 MG PO CAPS
100.0000 mg | ORAL_CAPSULE | Freq: Every day | ORAL | Status: DC
  Administered 2020-08-26 – 2020-08-28 (×3): 100 mg via ORAL
  Filled 2020-08-26 (×3): qty 1

## 2020-08-26 MED ORDER — KETOROLAC TROMETHAMINE 30 MG/ML IJ SOLN
INTRAMUSCULAR | Status: AC
  Filled 2020-08-26: qty 1

## 2020-08-26 MED ORDER — ROCURONIUM BROMIDE 10 MG/ML (PF) SYRINGE
PREFILLED_SYRINGE | INTRAVENOUS | Status: DC | PRN
  Administered 2020-08-26: 30 mg via INTRAVENOUS
  Administered 2020-08-26: 20 mg via INTRAVENOUS
  Administered 2020-08-26: 60 mg via INTRAVENOUS

## 2020-08-26 MED ORDER — ENOXAPARIN SODIUM 30 MG/0.3ML ~~LOC~~ SOLN
30.0000 mg | SUBCUTANEOUS | Status: DC
  Administered 2020-08-26 – 2020-08-28 (×3): 30 mg via SUBCUTANEOUS
  Filled 2020-08-26 (×3): qty 0.3

## 2020-08-26 MED ORDER — ALBUTEROL SULFATE HFA 108 (90 BASE) MCG/ACT IN AERS
1.0000 | INHALATION_SPRAY | RESPIRATORY_TRACT | Status: DC | PRN
  Filled 2020-08-26: qty 6.7

## 2020-08-26 MED ORDER — OXYCODONE-ACETAMINOPHEN 5-325 MG PO TABS
1.0000 | ORAL_TABLET | ORAL | Status: DC | PRN
  Administered 2020-08-26 – 2020-08-29 (×6): 1 via ORAL
  Filled 2020-08-26 (×6): qty 1

## 2020-08-26 MED ORDER — ONDANSETRON HCL 4 MG/2ML IJ SOLN: 4.0000 mg | Freq: Once | INTRAMUSCULAR | Status: DC | PRN

## 2020-08-26 MED ORDER — SENNOSIDES-DOCUSATE SODIUM 8.6-50 MG PO TABS
1.0000 | ORAL_TABLET | Freq: Every day | ORAL | Status: DC
  Administered 2020-08-26 – 2020-08-28 (×3): 1 via ORAL
  Filled 2020-08-26 (×3): qty 1

## 2020-08-26 MED ORDER — ORAL CARE MOUTH RINSE: 15.0000 mL | Freq: Once | OROMUCOSAL | Status: AC

## 2020-08-26 MED ORDER — CYCLOBENZAPRINE HCL 10 MG PO TABS
5.0000 mg | ORAL_TABLET | Freq: Three times a day (TID) | ORAL | Status: DC | PRN
  Administered 2020-08-26 – 2020-08-28 (×4): 5 mg via ORAL
  Filled 2020-08-26 (×5): qty 1

## 2020-08-26 MED ORDER — ONDANSETRON HCL 4 MG/2ML IJ SOLN: 4.0000 mg | Freq: Four times a day (QID) | INTRAMUSCULAR | Status: DC | PRN

## 2020-08-26 MED ORDER — PHENYLEPHRINE 40 MCG/ML (10ML) SYRINGE FOR IV PUSH (FOR BLOOD PRESSURE SUPPORT)
PREFILLED_SYRINGE | INTRAVENOUS | Status: DC | PRN
  Administered 2020-08-26: 120 ug via INTRAVENOUS
  Administered 2020-08-26: 80 ug via INTRAVENOUS

## 2020-08-26 MED ORDER — PROPOFOL 10 MG/ML IV BOLUS
INTRAVENOUS | Status: AC
  Filled 2020-08-26: qty 20

## 2020-08-26 MED ORDER — OXYCODONE-ACETAMINOPHEN 10-325 MG PO TABS: 1.0000 | ORAL_TABLET | ORAL | Status: DC | PRN

## 2020-08-26 MED ORDER — ONDANSETRON HCL 4 MG/2ML IJ SOLN
INTRAMUSCULAR | Status: DC | PRN
  Administered 2020-08-26: 4 mg via INTRAVENOUS

## 2020-08-26 MED ORDER — SUGAMMADEX SODIUM 200 MG/2ML IV SOLN
INTRAVENOUS | Status: DC | PRN
  Administered 2020-08-26: 200 mg via INTRAVENOUS

## 2020-08-26 MED ORDER — FENTANYL CITRATE (PF) 100 MCG/2ML IJ SOLN
INTRAMUSCULAR | Status: AC
  Filled 2020-08-26: qty 2

## 2020-08-26 MED ORDER — OXYCODONE HCL 5 MG PO TABS
5.0000 mg | ORAL_TABLET | ORAL | Status: DC | PRN
  Administered 2020-08-26 – 2020-08-29 (×8): 5 mg via ORAL
  Filled 2020-08-26 (×8): qty 1

## 2020-08-26 MED ORDER — ACETAMINOPHEN 160 MG/5ML PO SOLN
1000.0000 mg | Freq: Four times a day (QID) | ORAL | Status: DC
  Administered 2020-08-27 (×2): 1000 mg via ORAL
  Filled 2020-08-26 (×2): qty 40.6

## 2020-08-26 MED ORDER — FENTANYL CITRATE (PF) 250 MCG/5ML IJ SOLN
INTRAMUSCULAR | Status: DC | PRN
  Administered 2020-08-26: 50 ug via INTRAVENOUS
  Administered 2020-08-26: 100 ug via INTRAVENOUS
  Administered 2020-08-26 (×4): 50 ug via INTRAVENOUS

## 2020-08-26 MED ORDER — LACTATED RINGERS IV SOLN: INTRAVENOUS | Status: DC

## 2020-08-26 MED ORDER — PROPOFOL 10 MG/ML IV BOLUS
INTRAVENOUS | Status: DC | PRN
  Administered 2020-08-26: 120 mg via INTRAVENOUS

## 2020-08-26 MED ORDER — PANTOPRAZOLE SODIUM 40 MG PO TBEC: 80.0000 mg | DELAYED_RELEASE_TABLET | Freq: Every day | ORAL | Status: DC

## 2020-08-26 MED ORDER — MIDAZOLAM HCL 2 MG/2ML IJ SOLN
INTRAMUSCULAR | Status: AC
  Filled 2020-08-26: qty 2

## 2020-08-26 SURGICAL SUPPLY — 136 items
ADH SKN CLS APL DERMABOND .7 (GAUZE/BANDAGES/DRESSINGS) ×1
APL PRP STRL LF DISP 70% ISPRP (MISCELLANEOUS) ×1
BAG SPEC RTRVL C1550 15 (MISCELLANEOUS) ×1
BAG SPEC RTRVL LRG 6X4 10 (ENDOMECHANICALS)
BLADE CLIPPER SURG (BLADE) ×1 IMPLANT
BLADE SURG 11 STRL SS (BLADE) ×3 IMPLANT
BNDG COHESIVE 6X5 TAN STRL LF (GAUZE/BANDAGES/DRESSINGS) ×1 IMPLANT
CANISTER SUCT 3000ML PPV (MISCELLANEOUS) ×6 IMPLANT
CANNULA REDUC XI 12-8 STAPL (CANNULA) ×4
CANNULA REDUC XI 12-8MM STAPL (CANNULA) ×2
CANNULA REDUCER 12-8 DVNC XI (CANNULA) ×2 IMPLANT
CATH THORACIC 28FR (CATHETERS) IMPLANT
CATH THORACIC 28FR RT ANG (CATHETERS) IMPLANT
CATH THORACIC 36FR (CATHETERS) IMPLANT
CATH THORACIC 36FR RT ANG (CATHETERS) IMPLANT
CATH TROCAR 20FR (CATHETERS) IMPLANT
CHLORAPREP W/TINT 26 (MISCELLANEOUS) ×3 IMPLANT
CLIP VESOCCLUDE MED 6/CT (CLIP) IMPLANT
CNTNR URN SCR LID CUP LEK RST (MISCELLANEOUS) ×5 IMPLANT
CONN ST 1/4X3/8  BEN (MISCELLANEOUS)
CONN ST 1/4X3/8 BEN (MISCELLANEOUS) IMPLANT
CONN Y 3/8X3/8X3/8  BEN (MISCELLANEOUS)
CONN Y 3/8X3/8X3/8 BEN (MISCELLANEOUS) IMPLANT
CONT SPEC 4OZ STRL OR WHT (MISCELLANEOUS) ×36
COVER SURGICAL LIGHT HANDLE (MISCELLANEOUS) IMPLANT
DEFOGGER SCOPE WARMER CLEARIFY (MISCELLANEOUS) ×3 IMPLANT
DERMABOND ADVANCED (GAUZE/BANDAGES/DRESSINGS) ×2
DERMABOND ADVANCED .7 DNX12 (GAUZE/BANDAGES/DRESSINGS) ×1 IMPLANT
DISSECTOR BLUNT TIP ENDO 5MM (MISCELLANEOUS) IMPLANT
DRAIN CHANNEL 28F RND 3/8 FF (WOUND CARE) IMPLANT
DRAIN CHANNEL 32F RND 10.7 FF (WOUND CARE) IMPLANT
DRAPE ARM DVNC X/XI (DISPOSABLE) ×4 IMPLANT
DRAPE COLUMN DVNC XI (DISPOSABLE) ×1 IMPLANT
DRAPE CV SPLIT W-CLR ANES SCRN (DRAPES) ×3 IMPLANT
DRAPE DA VINCI XI ARM (DISPOSABLE) ×12
DRAPE DA VINCI XI COLUMN (DISPOSABLE) ×3
DRAPE ORTHO SPLIT 77X108 STRL (DRAPES) ×3
DRAPE SURG ORHT 6 SPLT 77X108 (DRAPES) ×1 IMPLANT
DRAPE WARM FLUID 44X44 (DRAPES) ×3 IMPLANT
ELECT BLADE 6.5 EXT (BLADE) IMPLANT
ELECT REM PT RETURN 9FT ADLT (ELECTROSURGICAL) ×3
ELECTRODE REM PT RTRN 9FT ADLT (ELECTROSURGICAL) ×1 IMPLANT
GAUZE KITTNER 4X10 (MISCELLANEOUS) ×3 IMPLANT
GAUZE KITTNER 4X5 RF (MISCELLANEOUS) ×4 IMPLANT
GAUZE KITTNER 4X8 (MISCELLANEOUS) IMPLANT
GAUZE SPONGE 4X4 12PLY STRL (GAUZE/BANDAGES/DRESSINGS) ×3 IMPLANT
GLOVE BIO SURGEON STRL SZ7 (GLOVE) ×2 IMPLANT
GLOVE BIO SURGEON STRL SZ7.5 (GLOVE) ×6 IMPLANT
GLOVE SURG SS PI 8.0 STRL IVOR (GLOVE) ×2 IMPLANT
GLOVE SURG UNDER POLY LF SZ7 (GLOVE) ×2 IMPLANT
GOWN STRL REUS W/ TWL LRG LVL3 (GOWN DISPOSABLE) ×2 IMPLANT
GOWN STRL REUS W/ TWL XL LVL3 (GOWN DISPOSABLE) ×3 IMPLANT
GOWN STRL REUS W/TWL 2XL LVL3 (GOWN DISPOSABLE) ×3 IMPLANT
GOWN STRL REUS W/TWL LRG LVL3 (GOWN DISPOSABLE) ×3
GOWN STRL REUS W/TWL XL LVL3 (GOWN DISPOSABLE) ×15
HEMOSTAT SURGICEL 2X14 (HEMOSTASIS) ×5 IMPLANT
IRRIGATION STRYKERFLOW (MISCELLANEOUS) IMPLANT
IRRIGATOR STRYKERFLOW (MISCELLANEOUS)
KIT BASIN OR (CUSTOM PROCEDURE TRAY) ×3 IMPLANT
KIT SUCTION CATH 14FR (SUCTIONS) IMPLANT
KIT TURNOVER KIT B (KITS) ×3 IMPLANT
LOOP VESSEL SUPERMAXI WHITE (MISCELLANEOUS) IMPLANT
NEEDLE 22X1 1/2 (OR ONLY) (NEEDLE) ×3 IMPLANT
NS IRRIG 1000ML POUR BTL (IV SOLUTION) ×9 IMPLANT
PACK CHEST (CUSTOM PROCEDURE TRAY) ×3 IMPLANT
PAD ARMBOARD 7.5X6 YLW CONV (MISCELLANEOUS) ×15 IMPLANT
PORT ACCESS TROCAR AIRSEAL 12 (TROCAR) ×1 IMPLANT
PORT ACCESS TROCAR AIRSEAL 5M (TROCAR) ×2
POUCH ENDO CATCH II 15MM (MISCELLANEOUS) IMPLANT
POUCH SPECIMEN RETRIEVAL 10MM (ENDOMECHANICALS) IMPLANT
RELOAD STAPLE 45 2.0 GRY DVNC (STAPLE) IMPLANT
RELOAD STAPLE 45 2.5 WHT DVNC (STAPLE) IMPLANT
RELOAD STAPLE 45 3.5 BLU DVNC (STAPLE) IMPLANT
RELOAD STAPLE 45 4.3 GRN DVNC (STAPLE) IMPLANT
RELOAD STAPLER 2.5X45 WHT DVNC (STAPLE) ×2 IMPLANT
RELOAD STAPLER 3.5X45 BLU DVNC (STAPLE) ×8 IMPLANT
RELOAD STAPLER 4.3X45 GRN DVNC (STAPLE) ×1 IMPLANT
RETRACTOR WOUND ALXS 19CM XSML (INSTRUMENTS) IMPLANT
RTRCTR WOUND ALEXIS 19CM XSML (INSTRUMENTS)
SCISSORS LAP 5X35 DISP (ENDOMECHANICALS) IMPLANT
SEAL CANN UNIV 5-8 DVNC XI (MISCELLANEOUS) ×2 IMPLANT
SEAL XI 5MM-8MM UNIVERSAL (MISCELLANEOUS) ×6
SEALANT PROGEL (MISCELLANEOUS) IMPLANT
SEALANT SURG COSEAL 4ML (VASCULAR PRODUCTS) IMPLANT
SEALANT SURG COSEAL 8ML (VASCULAR PRODUCTS) IMPLANT
SEALER LIGASURE MARYLAND 30 (ELECTROSURGICAL) IMPLANT
SET TRI-LUMEN FLTR TB AIRSEAL (TUBING) ×3 IMPLANT
SOLUTION ELECTROLUBE (MISCELLANEOUS) ×2 IMPLANT
SPONGE INTESTINAL PEANUT (DISPOSABLE) IMPLANT
SPONGE TONSIL TAPE 1 RFD (DISPOSABLE) IMPLANT
STAPLE RELOAD 45 2.0 GRAY (STAPLE) ×3
STAPLE RELOAD 45 2.0 GRAY DVNC (STAPLE) ×1 IMPLANT
STAPLER 45 SUREFORM CVD (STAPLE) ×3
STAPLER 45 SUREFORM CVD DVNC (STAPLE) IMPLANT
STAPLER CANNULA SEAL DVNC XI (STAPLE) ×2 IMPLANT
STAPLER CANNULA SEAL XI (STAPLE) ×6
STAPLER RELOAD 2.5X45 WHITE (STAPLE) ×6
STAPLER RELOAD 2.5X45 WHT DVNC (STAPLE) ×2
STAPLER RELOAD 3.5X45 BLU DVNC (STAPLE) ×8
STAPLER RELOAD 3.5X45 BLUE (STAPLE) ×24
STAPLER RELOAD 4.3X45 GREEN (STAPLE) ×3
STAPLER RELOAD 4.3X45 GRN DVNC (STAPLE) ×1
STOPCOCK 4 WAY LG BORE MALE ST (IV SETS) ×3 IMPLANT
SUT MNCRL AB 3-0 PS2 18 (SUTURE) IMPLANT
SUT MON AB 2-0 CT1 36 (SUTURE) IMPLANT
SUT PDS AB 1 CTX 36 (SUTURE) IMPLANT
SUT PROLENE 4 0 RB 1 (SUTURE)
SUT PROLENE 4-0 RB1 .5 CRCL 36 (SUTURE) IMPLANT
SUT SILK  1 MH (SUTURE) ×6
SUT SILK 1 MH (SUTURE) ×1 IMPLANT
SUT SILK 1 TIES 10X30 (SUTURE) IMPLANT
SUT SILK 2 0 SH (SUTURE) ×2 IMPLANT
SUT SILK 2 0SH CR/8 30 (SUTURE) IMPLANT
SUT VIC AB 1 CTX 36 (SUTURE)
SUT VIC AB 1 CTX36XBRD ANBCTR (SUTURE) IMPLANT
SUT VIC AB 2-0 CT1 27 (SUTURE) ×6
SUT VIC AB 2-0 CT1 TAPERPNT 27 (SUTURE) ×1 IMPLANT
SUT VIC AB 3-0 SH 27 (SUTURE) ×9
SUT VIC AB 3-0 SH 27X BRD (SUTURE) ×2 IMPLANT
SUT VICRYL 0 TIES 12 18 (SUTURE) ×3 IMPLANT
SUT VICRYL 0 UR6 27IN ABS (SUTURE) ×6 IMPLANT
SUT VICRYL 2 TP 1 (SUTURE) IMPLANT
SYR 10ML LL (SYRINGE) ×3 IMPLANT
SYR 20ML LL LF (SYRINGE) ×3 IMPLANT
SYR 50ML LL SCALE MARK (SYRINGE) ×3 IMPLANT
SYR BULB IRRIG 60ML STRL (SYRINGE) ×2 IMPLANT
SYSTEM RETRIEVAL ANCHOR 15 (MISCELLANEOUS) ×2 IMPLANT
SYSTEM SAHARA CHEST DRAIN ATS (WOUND CARE) ×3 IMPLANT
TAPE CLOTH 4X10 WHT NS (GAUZE/BANDAGES/DRESSINGS) ×3 IMPLANT
TAPE CLOTH SURG 4X10 WHT LF (GAUZE/BANDAGES/DRESSINGS) ×2 IMPLANT
TIP APPLICATOR SPRAY EXTEND 16 (VASCULAR PRODUCTS) IMPLANT
TOWEL GREEN STERILE (TOWEL DISPOSABLE) ×3 IMPLANT
TRAY FOL W/BAG SLVR 16FR STRL (SET/KITS/TRAYS/PACK) IMPLANT
TRAY FOLEY MTR SLVR 16FR STAT (SET/KITS/TRAYS/PACK) ×1 IMPLANT
TRAY FOLEY W/BAG SLVR 16FR LF (SET/KITS/TRAYS/PACK) ×3
TUBING EXTENTION W/L.L. (IV SETS) ×3 IMPLANT

## 2020-08-26 NOTE — Hospital Course (Signed)
History of Present Illness:  Mrs. Tonya [name] is a 74 yo AA female referred to Dr. Kipp Brood for evaluation of bilateral upper lobe pulmonary nodules.  The patient presented to the ED this past summer with complaints of chest pain with radiation down her left arm.  Workup was negative for MI.  She followed up with her Cardiologist who felt calcium scoring would be beneficial.  This was performed and she was found to have a right upper lobe nodule.  She was referred to Dr. Valeta Harms for further evaluation.  PET CT scan was obtained and showed the nodules to be hypermetabolic.  She underwent navigational bronchoscopy with biopsy of bilateral nodules, both of which were found to be non-small cell lung cancer.  Upon evaluation with Dr. Kipp Brood she admitted to a half pack a day smoking history.  However she quit earlier this year.  She admits to a 35 lbs weight loss which she attributed to the stress of caring for her ailing mother over the past year.  She denied neurologic symptoms.  It was felt the patient should undergo surgical resection of one of the lung nodules with SBRT on the other.  It was not felt she would tolerate bilateral lobectomies based on her PFTs.  It was decided to proceed with a Right Upper Lobectomy with SBRT to the left upper lobe nodule.  The risks and benefits of the procedure were explained to the patient and she was agreeable to proceed.   Hospital Course:   Mrs. Tonya [name] presented to [facility] on [facility]/26/2020.  She was taken to the operating room and underwent Robotic Assisted Video Thoracoscopy with Right Upper Lobectomy, Lymph Node Dissection, and Intercostal Nerve Block.  She tolerated the procedure without difficulty, was extubated and taken to the PACU in stable condition.

## 2020-08-26 NOTE — Anesthesia Postprocedure Evaluation (Signed)
Anesthesia Post Note  Patient: Tonya [name]  Procedure(s) Performed: XI ROBOTIC ASSISTED THORASCOPY-LOBECTOMY Right Upper Lobectomy (Right Chest) INTERCOSTAL NERVE BLOCK (Right Chest) NODE DISSECTION (Right Chest)     Patient location during evaluation: PACU Anesthesia Type: General Level of consciousness: awake and alert Pain management: pain level controlled Vital Signs Assessment: post-procedure vital signs reviewed and stable Respiratory status: spontaneous breathing, nonlabored ventilation, respiratory function stable and patient connected to nasal cannula oxygen Cardiovascular status: blood pressure returned to baseline and stable Postop Assessment: no apparent nausea or vomiting Anesthetic complications: no   No complications documented.  Last Vitals:  Vitals:   08/26/20 1200 08/26/20 1207  BP:  126/68  Pulse: 61 62  Resp: 17 (!) 22  Temp:    SpO2: 93% 92%    Last Pain:  Vitals:   08/26/20 1200  TempSrc:   PainSc: Asleep                 Catalina Gravel

## 2020-08-26 NOTE — Transfer of Care (Signed)
Immediate Anesthesia Transfer of Care Note  Patient: ANARELY NICHOLLS  Procedure(s) Performed: XI ROBOTIC ASSISTED THORASCOPY-LOBECTOMY Right Upper Lobectomy (Right Chest) INTERCOSTAL NERVE BLOCK (Right Chest) NODE DISSECTION (Right Chest)  Patient Location: PACU  Anesthesia Type:General  Level of Consciousness: drowsy, patient cooperative and responds to stimulation  Airway & Oxygen Therapy: Patient connected to face mask oxygen  Post-op Assessment: Report given to RN and Post -op Vital signs reviewed and stable  Post vital signs: Reviewed and stable  Last Vitals:  Vitals Value Taken Time  BP    Temp    Pulse 67 08/26/20 1052  Resp 28 08/26/20 1052  SpO2 100 % 08/26/20 1052  Vitals shown include unvalidated device data.  Last Pain:  Vitals:   08/26/20 0633  TempSrc:   PainSc: 4       Patients Stated Pain Goal: 3 (78/71/83 6725)  Complications: No complications documented.

## 2020-08-26 NOTE — Brief Op Note (Signed)
08/26/2020  10:32 AM  PATIENT:  Tonya [name]  74 y.o. female  PRE-OPERATIVE DIAGNOSIS:  Right upper lobe lung cancer  POST-OPERATIVE DIAGNOSIS:  Right upper lobe lung cancer  PROCEDURE:  Procedure(s):  XI ROBOTIC ASSISTED THORASCOPY-LOBECTOMY Right Upper Lobectomy (Right) INTERCOSTAL NERVE BLOCK (Right) NODE DISSECTION (Right)  SURGEON:  Surgeon(s) and Role:    * Lightfoot, Lucile Crater, MD - Primary  PHYSICIAN ASSISTANT: Ellwood Handler PA-C  ANESTHESIA:   general  EBL: minimal  BLOOD ADMINISTERED:none  DRAINS: 28 Straight Chest Tube   LOCAL MEDICATIONS USED:  BUPIVICAINE   SPECIMEN:  Source of Specimen:  Right Upper Lobe Lung, Lymph Nodes  DISPOSITION OF SPECIMEN:  PATHOLOGY  COUNTS:  YES  TOURNIQUET:  * No tourniquets in log *  DICTATION: .Dragon Dictation  PLAN OF CARE: Admit to inpatient   PATIENT DISPOSITION:  PACU - hemodynamically stable.   Delay start of Pharmacological VTE agent (>24hrs) due to surgical blood loss or risk of bleeding: no

## 2020-08-26 NOTE — Anesthesia Procedure Notes (Signed)
Arterial Line Insertion Start/End12/20/2021 7:00 AM, 08/26/2020 7:10 AM Performed by: Catalina Gravel, MD, anesthesiologist  Patient location: Pre-op. Preanesthetic checklist: patient identified, IV checked, site marked, risks and benefits discussed, surgical consent, monitors and equipment checked, pre-op evaluation, timeout performed and anesthesia consent Lidocaine 1% used for infiltration Right, radial was placed Catheter size: 20 Fr Hand hygiene performed  and maximum sterile barriers used   Attempts: 1 Procedure performed using ultrasound guided technique. Ultrasound Notes:anatomy identified, needle tip was noted to be adjacent to the nerve/plexus identified, no ultrasound evidence of intravascular and/or intraneural injection and image(s) printed for medical record Following insertion, dressing applied and Biopatch. Post procedure assessment: normal and unchanged    Patient tolerated the procedure well with no immediate complications.

## 2020-08-26 NOTE — Op Note (Signed)
[address]       [place] [address]             [phone]        08/26/2020  Patient:  Tonya [name] Pre-Op Dx: Right upper lobe NSCLC   Post-op Dx:  same Procedure: - Robotic assisted right video thoracoscopy - Right upper lobectomy - Mediastinal lymph node sampling - Intercostal nerve block  Surgeon and Role:      * Makeila Yamaguchi, Lucile Crater, MD - Primary    * Karsten Ro, PA-C - assisting Anesthesia  general EBL:  50 ml Blood Administration: none Specimen:  Right upper lobe.  Level 7, 9, and hilar lymph node  Drains: 28 F argyle chest tube in right chest Counts: correct   Indications: This is a 74 year old female that presents with biopsy-proven  non-small cell lung cancers in bilateral upper lobes. She also has a 0.7 cm right upper lobe nodule concerning for a satellite lesion, and faint uptake in a left hilar lymph node, and mediastinal lymph node on the right. Based off of her imaging I think this is more consistent with synchronous primaries as opposed to stage IV disease. She does not have significant uptake in either the mediastinal lymph nodes or either hilar nodes.   We discussed several options for management of these lung cancers which included SBRT to both lesions versus surgical resection of the right upper lobe and SBRT to left upper lobe. Based off of her lung function I think that she can tolerate the right upper lobectomy, but she would not be able to tolerate bilateral upper lobe resections. She is agreeable to proceed with this plan. I will obtain an MRI brain for completion, and speak with her cardiologist in regards to preop clearance. She is tentatively scheduled for August 29, 2020. She will undergo a right robotic assisted right upper lobectomy  Findings: Large level 9 and 7 lymph node.  No obvious nodes at level 4.    Operative Technique: After the risks, benefits and alternatives were thoroughly discussed, the  patient was brought to the operative theatre.  Anesthesia was induced, and the patient was then placed in a left lateral decubitus position and was prepped and draped in normal sterile fashion.  An appropriate surgical pause was performed, and pre-operative antibiotics were dosed accordingly.  We began by placing our 4 robotic ports in the the 7th intercostal space targeting the hilum of the lung.  A 15mm assistant port was placed in the 9th intercostal space in the anterior axillary line.  The robot was then docked and all instruments were passed under direct visualization.    The lung was then retracted superiorly, and the inferior pulmonary ligament was divided.  The hilum was mobilized anteriorly and posteriorly.  We identified the upper lobe pulmonary vein, and after careful isolation, it was divided with a vascular stapler.  We next moved to the  anterior truncal branches.  The artery was then divided with a vascular load stapler.  The bronchus to the upper lobe was then isolated.  After a test clamp, with good ventilation of the middle and lower lobes, the bronchus was then divided.  The fissure was completed, and the middle lobe was pexied to the lower lobe with another firing of the stappler.  The specimen was passed into an endocatch bag.  It was removed from the anterior access site.    Lymph nodes were then sampled at levels 9  and 7.  The chest was irrigated, and an air leak test was performed.  An intercostal nerve block was performed under direct visualization.  A 5F chest with then placed, and we watch the remaining lobes re-expand.  The skin and soft tissue were closed with absorbable suture    The patient tolerated the procedure without any immediate complications, and was transferred to the PACU in stable condition.  Cielle Aguila Bary Leriche

## 2020-08-26 NOTE — Interval H&P Note (Signed)
History and Physical Interval Note:  08/26/2020 7:32 AM  Tonya [name]  has presented today for surgery, with the diagnosis of Right upper lobe lung cancer.  The various methods of treatment have been discussed with the patient and family. After consideration of risks, benefits and other options for treatment, the patient has consented to  Procedure(s): XI ROBOTIC ASSISTED THORASCOPY-LOBECTOMY Right Upper Lobectomy (Right) as a surgical intervention.  The patient's history has been reviewed, patient examined, no change in status, stable for surgery.  I have reviewed the patient's chart and labs.  Questions were answered to the patient's satisfaction.     [name]

## 2020-08-26 NOTE — Progress Notes (Signed)
Received patient from PACU to room 4E09.  VSS, CT to waterseal, oriented to unit.  Telemetry set up and CHG completed.

## 2020-08-26 NOTE — Anesthesia Procedure Notes (Addendum)
Procedure Name: Intubation Date/Time: 08/26/2020 7:48 AM Performed by: Catalina Gravel, MD Pre-anesthesia Checklist: Patient identified, Emergency Drugs available, Suction available and Patient being monitored Patient Re-evaluated:Patient Re-evaluated prior to induction Oxygen Delivery Method: Circle system utilized Preoxygenation: Pre-oxygenation with 100% oxygen Induction Type: IV induction Ventilation: Mask ventilation without difficulty Laryngoscope Size: Mac and 4 Grade View: Grade I Tube type: Oral Endobronchial tube: Double lumen EBT, EBT position confirmed by auscultation and EBT position confirmed by fiberoptic bronchoscope and 37 Fr Number of attempts: 2 (x1 by SRNA, x1 by MDA with successful placement) Airway Equipment and Method: Stylet and Oral airway Placement Confirmation: ETT inserted through vocal cords under direct vision,  positive ETCO2 and breath sounds checked- equal and bilateral Secured at: 29 cm Tube secured with: Tape Dental Injury: Teeth and Oropharynx as per pre-operative assessment  Comments: Dl x 1 by SRNA, unable to pass through cords. DL x2 by Dr. Gifford Shave.

## 2020-08-26 NOTE — Discharge Instructions (Signed)
Robot-Assisted Thoracic Surgery, Care After This sheet gives you information about how to care for yourself after your procedure. Your health care provider may also give you more specific instructions. If you have problems or questions, contact your health care provider. What can I expect after the procedure? After the procedure, it is common to have:  Some pain and aches in the area of your surgical cuts (incisions).  Pain when breathing in (inhaling) and coughing.  Tiredness (fatigue).  Trouble sleeping.  Constipation. Follow these instructions at home: Medicines  Take over-the-counter and prescription medicines only as told by your health care provider.  If you were prescribed an antibiotic medicine, take it as told by your health care provider. Do not stop taking the antibiotic even if you start to feel better.  Talk with your health care provider about safe and effective ways to manage pain after your procedure. Pain management should fit your specific health needs.  Take prescription pain medicine before pain becomes severe. Relieving and controlling your pain will make breathing easier for you. Activity  Return to your normal activities as told by your health care provider. Ask your health care provider what activities are safe for you.  Do not lift anything that is heavier than 10 lb (4.5 kg), or the limit that you are told, until your health care provider says that it is safe.  Avoid sitting for a long time without moving. Get up and move around one or more times every few hours. Bathing  Do not take baths, swim, or use a hot tub until your health care provider approves. You may take showers. Incision care  Follow instructions from your health care provider about how to take care of your incision(s). Make sure you: ? Wash your hands with soap and water before you change your bandage (dressing). If soap and water are not available, use hand sanitizer. ? Change your  dressing as told by your health care provider. ? Leave stitches (sutures), skin glue, or adhesive strips in place. These skin closures may need to stay in place for 2 weeks or longer. If adhesive strip edges start to loosen and curl up, you may trim the loose edges. Do not remove adhesive strips completely unless your health care provider tells you to do that.  Check your incision area every day for signs of infection. Check for: ? Redness, swelling, or pain. ? Fluid or blood. ? Warmth. ? Pus or a bad smell. Driving  Ask your health care provider when it is safe for you to drive.  Do not drive or use heavy machinery while taking prescription pain medicine. Eating and drinking  Follow instructions from your health care provider about eating or drinking restrictions. These will vary depending on what procedure you had. Your health care provider may recommend: ? A liquid diet or soft diet for the first few days. ? Meals that are smaller and more frequent. ? A diet of fruits, vegetables, whole grains, and low-fat proteins. ? Limiting foods that are high in processed sugar and fat, including fried and sweet foods. Pneumonia prevention   Do not use any products that contain nicotine or tobacco, such as cigarettes and e-cigarettes. If you need help quitting, ask your health care provider.  Avoid secondhand smoke.  Do deep breathing exercises and cough regularly as directed. This helps to clear mucus and prevent pneumonia. If it hurts to cough, try one of these methods to ease your pain when you cough: ? Hold a  pillow against your chest. ? Place the palms of both hands over your incisions (use splinting).  Use an incentive spirometer as directed. This device measures how much air your lungs are getting with each breath. Using this will improve your breathing.  Do pulmonary rehabilitation as directed. This is a program that includes exercise, education, and support. General  instructions  Wear compression stockings as told by your health care provider. These stockings help to prevent blood clots and reduce swelling in your legs.  If you have a drainage tube: ? Follow instructions from your health care provider about how to take care of it. ? Do not travel by airplane after your tube is removed until your health care provider tells you it is safe.  To prevent or treat constipation while you are taking prescription pain medicine, your health care provider may recommend that you: ? Drink enough fluid to keep your urine pale yellow. ? Take over-the-counter or prescription medicines. ? Eat foods that are high in fiber, such as fresh fruits and vegetables, whole grains, and beans. ? Limit foods that are high in fat and processed sugars, such as fried and sweet foods.  Keep all follow-up visits as told by your health care provider. This is important. Contact a health care provider if:  You have redness, swelling, or pain around an incision.  You have fluid or blood coming from an incision.  An incision feels warm to the touch.  You have pus or a bad smell coming from an incision.  You have a fever.  You cannot eat or drink without vomiting.  Your prescription pain medicine is not controlling your pain. Get help right away if:  You have chest pain.  Your heart is beating quickly.  You have trouble breathing.  You have trouble speaking.  You are confused.  You feel weak or dizzy, or you faint. These symptoms may represent a serious problem that is an emergency. Do not wait to see if the symptoms will go away. Get medical help right away. Call your local emergency services (911 in the [place] Do not drive yourself to the hospital. Summary  Talk with your health care provider about safe and effective ways to manage pain after your procedure. Pain management should fit your specific health needs.  Return to your normal activities as told by your health  care provider. Ask your health care provider what activities are safe for you.  Do deep breathing exercises and cough regularly as directed. This helps to clear mucus and prevent pneumonia. If it hurts to cough, ease pain by holding a pillow against your chest or by placing the palms of both hands over your incisions (splinting). This information is not intended to replace advice given to you by your health care provider. Make sure you discuss any questions you have with your health care provider. Document Revised: 06/23/2019 Document Reviewed: 12/28/2016 Elsevier Patient Education  [place].

## 2020-08-26 NOTE — Anesthesia Procedure Notes (Addendum)
Central Venous Catheter Insertion Performed by: Catalina Gravel, MD, anesthesiologist Start/End12/20/2021 6:50 AM, 08/26/2020 7:00 AM Preanesthetic checklist: patient identified, IV checked, site marked, risks and benefits discussed, surgical consent, monitors and equipment checked, pre-op evaluation, timeout performed and anesthesia consent Position: Trendelenburg Lidocaine 1% used for infiltration and patient sedated Hand hygiene performed , maximum sterile barriers used  and Seldinger technique used Catheter size: 8 Fr Total catheter length 16. Central line was placed.Double lumen Procedure performed using ultrasound guided technique. Ultrasound Notes:anatomy identified, needle tip was noted to be adjacent to the nerve/plexus identified, no ultrasound evidence of intravascular and/or intraneural injection and image(s) printed for medical record Attempts: 1 Following insertion, line sutured, dressing applied and Biopatch. Post procedure assessment: blood return through all ports, free fluid flow and no air  Patient tolerated the procedure well with no immediate complications.

## 2020-08-27 ENCOUNTER — Inpatient Hospital Stay (HOSPITAL_COMMUNITY): Payer: Medicare HMO

## 2020-08-27 ENCOUNTER — Encounter (HOSPITAL_COMMUNITY): Payer: Self-pay | Admitting: Thoracic Surgery (Cardiothoracic Vascular Surgery)

## 2020-08-27 LAB — BASIC METABOLIC PANEL
Anion gap: 10 (ref 5–15)
BUN: 10 mg/dL (ref 8–23)
CO2: 23 mmol/L (ref 22–32)
Calcium: 8.7 mg/dL — ABNORMAL LOW (ref 8.9–10.3)
Chloride: 107 mmol/L (ref 98–111)
Creatinine, Ser: 0.83 mg/dL (ref 0.44–1.00)
GFR, Estimated: >60 mL/min (ref >60)
Glucose, Bld: 129 mg/dL — ABNORMAL HIGH (ref 70–99)
Potassium: 3.6 mmol/L (ref 3.5–5.1)
Sodium: 140 mmol/L (ref 135–145)

## 2020-08-27 LAB — CBC
HCT: 33.6 % — ABNORMAL LOW (ref 36.0–46.0)
Hemoglobin: 10.7 g/dL — ABNORMAL LOW (ref 12.0–15.0)
MCH: 32.1 pg (ref 26.0–34.0)
MCHC: 31.8 g/dL (ref 30.0–36.0)
MCV: 100.9 fL — ABNORMAL HIGH (ref 80.0–100.0)
Platelets: 197 10*3/uL (ref 150–400)
RBC: 3.33 MIL/uL — ABNORMAL LOW (ref 3.87–5.11)
RDW: 12.1 % (ref 11.5–15.5)
WBC: 9.5 10*3/uL (ref 4.0–10.5)
nRBC: 0 % (ref 0.0–0.2)

## 2020-08-27 LAB — SURGICAL PATHOLOGY

## 2020-08-27 MED ORDER — KETOROLAC TROMETHAMINE 30 MG/ML IJ SOLN
30.0000 mg | Freq: Four times a day (QID) | INTRAMUSCULAR | Status: DC
  Administered 2020-08-27 (×2): 30 mg via INTRAVENOUS
  Filled 2020-08-27 (×2): qty 1

## 2020-08-27 MED ORDER — ACETAMINOPHEN 500 MG PO TABS: 1000.0000 mg | ORAL_TABLET | Freq: Four times a day (QID) | ORAL | 0 refills | Status: DC | PRN

## 2020-08-27 NOTE — Progress Notes (Signed)
Pt asked if she would allow [place] to replace PIV to allow for medication administration. She stated no. She said she would just take oral medications for pain. She stated she does not want to be stuck again.

## 2020-08-27 NOTE — Progress Notes (Signed)
Pt IV infiltrated and was removed. Pt request not to have a new one placed.Pt educated on IV scheduled medication pt stated she will do without she doesn't want to get stuck again if she may be going home tomorrow.   Phoebe Sharps, RN

## 2020-08-27 NOTE — Progress Notes (Addendum)
      [address]       [place] [address]             [phone]      1 Day Post-Op Procedure(s) (LRB): XI ROBOTIC ASSISTED THORASCOPY-LOBECTOMY Right Upper Lobectomy (Right) INTERCOSTAL NERVE BLOCK (Right) NODE DISSECTION (Right)   Subjective: The patient is doing okay.  She states her pain is controlled.  Denies N/V  Objective: Vital signs in last 24 hours: Temp:  [97 F (36.1 C)-99.8 F (37.7 C)] 99 F (37.2 C) (12/21 0444) Pulse Rate:  [36-69] 58 (12/21 0400) Cardiac Rhythm: Normal sinus rhythm (12/20 2100) Resp:  [17-29] 20 (12/21 0444) BP: (101-162)/(56-87) 101/87 (12/21 0400) SpO2:  [92 %-100 %] 96 % (12/21 0400) Arterial Line BP: (143-161)/(61-79) 146/61 (12/20 1145)  Intake/Output from previous day: 12/20 0701 - 12/21 0700 In: 1100 [I.V.:900; IV Piggyback:200] Out: 785 [Urine:350; Blood:25; Chest Tube:410]  General appearance: alert, cooperative and no distress Heart: regular rate and rhythm Lungs: clear to auscultation bilaterally Abdomen: soft, non-tender; bowel sounds normal; no masses,  no organomegaly Extremities: extremities normal, atraumatic, no cyanosis or edema Wound: clean and dry  Lab Results: No results for input(s): WBC, HGB, HCT, PLT in the last 72 hours. BMET: No results for input(s): NA, K, CL, CO2, GLUCOSE, BUN, CREATININE, CALCIUM in the last 72 hours.  PT/INR: No results for input(s): LABPROT, INR in the last 72 hours. ABG    Component Value Date/Time   PHART 7.393 08/22/2020 1142   HCO3 23.9 08/22/2020 1142   TCO2 27 09/03/2016 2007   ACIDBASEDEF 0.4 08/22/2020 1142   O2SAT 96.8 08/22/2020 1142   CBG (last 3)  No results for input(s): GLUCAP in the last 72 hours.  Assessment/Plan: S/P Procedure(s) (LRB): XI ROBOTIC ASSISTED THORASCOPY-LOBECTOMY Right Upper Lobectomy (Right) INTERCOSTAL NERVE BLOCK (Right) NODE DISSECTION (Right)  1. CV- Some A. Fib overnight, brief and intermittent, currently in NSR 2.  Pulm- no air leak on water seal, CXR free from pneumothorax... 500 cc output since surgery... will discuss removal with Dr. Kipp Brood, output may be too high 3. D/C Central Line 4. Lovenox for DVT prophylaxis 5. dispo- patient stable, no air leak on water seal, CXR free from pneumothorax, will discuss CT removal with Dr. Kipp Brood as output was 500 since surgery, possibly for d/c in AM   LOS: 1 day    Ellwood Handler, PA-C 08/27/2020  Agree with above Will schedule toradol Ambulation and pulm toilet Will keep chest tube today Likely home tomorrow  [name]

## 2020-08-27 NOTE — Progress Notes (Signed)
Mobility Specialist: Progress Note   08/27/20 1402  Mobility  Activity Ambulated in hall  Level of Assistance Modified independent, requires aide device or extra time  Assistive Device Front wheel walker  Distance Ambulated (ft) 270 ft  Mobility Response Tolerated well  Mobility performed by Mobility specialist  Bed Position Chair  $Mobility charge 1 Mobility   Pre-Mobility: 74 HR, 118/68 BP, 97% SpO2 During Mobility:            After BR: 123 HR           During hallway ambulation: 99 HR Post-Mobility: 78 HR, 135/70 BP, 98% SpO2  Pt to BR upon entering room. Pt asx during ambulation. Encouraged pt to continue ambulation with staff.   [facility] Tonya [name] Mobility Specialist

## 2020-08-27 NOTE — Discharge Summary (Signed)
Physician Discharge Summary  Patient ID: Tonya [name] MRN: [id number] DOB/AGE: 1946/03/15 74 y.o.  Admit date: 08/26/2020 Discharge date: 08/28/2020  Admission Diagnoses:  Patient Active Problem List   Diagnosis Date Noted  . Mediastinal adenopathy 07/18/2020  . Lung nodules   . Lung nodule 06/14/2020  . 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  . Tobacco dependence 05/29/2020  . Exertional chest pain 05/28/2020  . Numbness and tingling of left arm and leg 07/01/2012  . Hyperlipidemia LDL goal < 100 07/01/2012  . Chest pressure 06/30/2012  . GERD (gastroesophageal reflux disease) 06/30/2012    Discharge Diagnoses:  Patient Active Problem List   Diagnosis Date Noted  . S/P Robotic Assited Video Thoracoscopy with Right Upper Lobectomy Lung 08/26/2020  . Mediastinal adenopathy 07/18/2020  . Lung nodules   . Lung nodule 06/14/2020  . 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  . Tobacco dependence 05/29/2020  . Exertional chest pain 05/28/2020  . Numbness and tingling of left arm and leg 07/01/2012  . Hyperlipidemia LDL goal < 100 07/01/2012  . Chest pressure 06/30/2012  . GERD (gastroesophageal reflux disease) 06/30/2012   Discharged Condition: good  History of Present Illness:  Tonya [name] is a 74 yo AA female referred to Dr. Kipp Brood for evaluation of bilateral upper lobe pulmonary nodules.  The patient presented to the ED this past summer with complaints of chest pain with radiation down her left arm.  Workup was negative for MI.  She followed up with her Cardiologist who felt calcium scoring would be beneficial.  This was performed and she was found to have a right upper lobe nodule.  She was referred to Dr. Valeta Harms for further evaluation.  PET CT scan was obtained and showed the nodules to be hypermetabolic.  She underwent navigational bronchoscopy with biopsy of bilateral nodules, both of which were found to be non-small cell lung cancer.  Upon evaluation with Dr. Kipp Brood  she admitted to a half pack a day smoking history.  However she quit earlier this year.  She admits to a 35 lbs weight loss which she attributed to the stress of caring for her ailing mother over the past year.  She denied neurologic symptoms.  It was felt the patient should undergo surgical resection of one of the lung nodules with SBRT on the other.  It was not felt she would tolerate bilateral lobectomies based on her PFTs.  It was decided to proceed with a Right Upper Lobectomy with SBRT to the left upper lobe nodule.  The risks and benefits of the procedure were explained to the patient and she was agreeable to proceed.   Hospital Course:   Tonya [name] presented to [facility] on 08/26/2020.  She was taken to the operating room and underwent Robotic Assisted Video Thoracoscopy with Right Upper Lobectomy, Lymph Node Dissection, and Intercostal Nerve Block.  She tolerated the procedure without difficulty, was extubated and taken to the PACU in stable condition.  The patient did well post operatively.  Her chest tube showed no evidence of air leak on water seal.  CXR was free from pneumothorax.  Her chest tube output was 500 cc since surgery and due to this her chest tube was left in place.  She was instructed to get up and move around the unit.  There was no further significant chest tube output and her chest tube was removed on 08/28/2020. Follow up CXR showed a right sided pleural effusion/elevation of her diaphragm.  There was no pneumothorax.  Her central line was  removed without difficulty.  Her pain was well controlled during her stay on her home regimen of chronic pain medications.  Due to home use of Percocet and last fill of earlier this month she will not be given a prescription for pain medication at discharge.  Her surgical incisions are healing without evidence of infection.  She is felt to be medically stable for discharge home today.  Significant Diagnostic Studies: nuclear  medicine:   1. Abnormal hypermetabolic activity in the 8 mm left upper lobe pulmonary nodule and in the 1.7 by 1.0 cm sub solid right pulmonary nodule, concerning for multifocal malignancy. The other pulmonary nodules are not appreciably hypermetabolic, but are below sensitive PET-CT size thresholds. 2. Small but borderline hypermetabolic mediastinal lymph nodes along the left hilum and anterior to the SVC. 3. Other imaging findings of potential clinical significance: Aortic Atherosclerosis (ICD10-I70.0) and Emphysema (ICD10-J43.9). Sludge or gallstones in the gallbladder. Nonobstructive left nephrolithiasis.  Treatments: surgery:   Pre-Op Dx: Right upper lobe NSCLC   Post-op Dx:  same Procedure: - Robotic assisted right video thoracoscopy - Right upper lobectomy - Mediastinal lymph node sampling - Intercostal nerve block  Surgeon and Role:      * Lightfoot, Lucile Crater, MD - Primary    * E. Barret, PA-C - assisting   PATHOLOGY: Remains pending  Discharge Exam: Blood pressure 127/70, pulse 81, temperature 98.5 F (36.9 C), temperature source Oral, resp. rate 20, height 5\' 4"  (1.626 m), weight 61.2 kg, SpO2 94 %.  General appearance: alert, cooperative and no distress Heart: regular rate and rhythm Lungs: diminished breath sounds on right Abdomen: soft, non-tender; bowel sounds normal; no masses,  no organomegaly Extremities: extremities normal, atraumatic, no cyanosis or edema Wound: clean and dry   Allergies as of 08/28/2020      Reactions   Penicillins Hives   REACTION: 20 years      Medication List    TAKE these medications   acetaminophen 500 MG tablet Commonly known as: TYLENOL Take 2 tablets (1,000 mg total) by mouth every 6 (six) hours as needed.   albuterol 108 (90 Base) MCG/ACT inhaler Commonly known as: VENTOLIN HFA Inhale 1-2 puffs into the lungs every 4 (four) hours as needed for wheezing or shortness of breath.   alendronate 70 MG  tablet Commonly known as: FOSAMAX Take 70 mg by mouth every Saturday.   ALPRAZolam 1 MG tablet Commonly known as: XANAX Take 1 mg by mouth 3 (three) times daily as needed for anxiety or sleep.   CALCIUM + D PO Take 600 mg by mouth 2 (two) times daily.   cyclobenzaprine 5 MG tablet Commonly known as: FLEXERIL Take 5 mg by mouth 3 (three) times daily as needed for muscle spasms (sciatic nerve).   gabapentin 100 MG capsule Commonly known as: NEURONTIN Take 100 mg by mouth at bedtime.   lidocaine 5 % Commonly known as: Lidoderm Place 1 patch onto the skin daily as needed. Apply patch to area most significant pain once per day.  Remove and discard patch within 12 hours of application. What changed: reasons to take this   multivitamin with minerals Tabs tablet Take 1 tablet by mouth daily.   naproxen 375 MG tablet Commonly known as: NAPROSYN Take 1 tablet (375 mg total) by mouth 2 (two) times daily as needed for moderate pain.   omeprazole 40 MG capsule Commonly known as: PRILOSEC Take 40 mg by mouth 2 (two) times daily.   oxyCODONE-acetaminophen 10-325 MG tablet Commonly  known as: PERCOCET Take 1 tablet by mouth every 4 (four) hours as needed for moderate pain.   rosuvastatin 20 MG tablet Commonly known as: CRESTOR Take 1 tablet (20 mg total) by mouth daily. What changed:   how much to take  when to take this   Stiolto Respimat 2.5-2.5 MCG/ACT Aers Generic drug: Tiotropium Bromide-Olodaterol Inhale 2 puffs into the lungs daily. What changed:   when to take this  reasons to take this       Follow-up Information    [name], MD Follow up on 09/03/2020.   Specialty: Cardiothoracic Surgery Why: Appointment is at 2:30 Contact information: [address] [place] [address] [phone]               Signed: Ellwood Handler, PA-C  08/28/2020, 8:10 AM

## 2020-08-28 ENCOUNTER — Inpatient Hospital Stay (HOSPITAL_COMMUNITY): Payer: Medicare HMO

## 2020-08-28 LAB — CBC
HCT: 36.8 % (ref 36.0–46.0)
Hemoglobin: 11.5 g/dL — ABNORMAL LOW (ref 12.0–15.0)
MCH: 32.4 pg (ref 26.0–34.0)
MCHC: 31.3 g/dL (ref 30.0–36.0)
MCV: 103.7 fL — ABNORMAL HIGH (ref 80.0–100.0)
Platelets: 189 10*3/uL (ref 150–400)
RBC: 3.55 MIL/uL — ABNORMAL LOW (ref 3.87–5.11)
RDW: 12.1 % (ref 11.5–15.5)
WBC: 10.8 10*3/uL — ABNORMAL HIGH (ref 4.0–10.5)
nRBC: 0 % (ref 0.0–0.2)

## 2020-08-28 LAB — COMPREHENSIVE METABOLIC PANEL
ALT: 17 U/L (ref 0–44)
AST: 24 U/L (ref 15–41)
Albumin: 3.1 g/dL — ABNORMAL LOW (ref 3.5–5.0)
Alkaline Phosphatase: 62 U/L (ref 38–126)
Anion gap: 10 (ref 5–15)
BUN: 11 mg/dL (ref 8–23)
CO2: 21 mmol/L — ABNORMAL LOW (ref 22–32)
Calcium: 9 mg/dL (ref 8.9–10.3)
Chloride: 107 mmol/L (ref 98–111)
Creatinine, Ser: 0.66 mg/dL (ref 0.44–1.00)
GFR, Estimated: >60 mL/min (ref >60)
Glucose, Bld: 98 mg/dL (ref 70–99)
Potassium: 4 mmol/L (ref 3.5–5.1)
Sodium: 138 mmol/L (ref 135–145)
Total Bilirubin: 0.7 mg/dL (ref 0.3–1.2)
Total Protein: 6 g/dL — ABNORMAL LOW (ref 6.5–8.1)

## 2020-08-28 MED ORDER — KETOROLAC TROMETHAMINE 10 MG PO TABS
10.0000 mg | ORAL_TABLET | Freq: Four times a day (QID) | ORAL | Status: DC | PRN
  Administered 2020-08-28 (×2): 10 mg via ORAL
  Filled 2020-08-28 (×4): qty 1

## 2020-08-28 NOTE — Progress Notes (Addendum)
      [address]       [name],Tonya [address]             [phone]      2 Days Post-Op Procedure(s) (LRB): XI ROBOTIC ASSISTED THORASCOPY-LOBECTOMY Right Upper Lobectomy (Right) INTERCOSTAL NERVE BLOCK (Right) NODE DISSECTION (Right)   Subjective:  Patient states she isn't feeling great this morning. She states she didn't sleep last night.  She also continues to have some discomfort at her incision sites and hasn't been able to move her bowels yet, + passing gas  Objective: Vital signs in last 24 hours: Temp:  [97.5 F (36.4 C)-98.9 F (37.2 C)] 98.5 F (36.9 C) (12/22 0415) Pulse Rate:  [63-84] 81 (12/22 0415) Cardiac Rhythm: Supraventricular tachycardia (12/22 0128) Resp:  [18-27] 20 (12/22 0415) BP: (108-129)/(55-77) 127/70 (12/22 0415) SpO2:  [94 %-98 %] 94 % (12/22 0415)  Intake/Output from previous day: 12/21 0701 - 12/22 0700 In: 120 [P.O.:120] Out: 290 [Chest Tube:290]  General appearance: alert, cooperative and no distress Heart: regular rate and rhythm Lungs: clear to auscultation bilaterally Abdomen: soft, non-tender; bowel sounds normal; no masses,  no organomegaly Extremities: extremities normal, atraumatic, no cyanosis or edema Wound: clean and dry  Lab Results: Recent Labs    08/27/20 0500 08/28/20 0140  WBC 9.5 10.8*  HGB 10.7* 11.5*  HCT 33.6* 36.8  PLT 197 189   BMET:  Recent Labs    08/27/20 0500 08/28/20 0140  NA 140 138  K 3.6 4.0  CL 107 107  CO2 23 21*  GLUCOSE 129* 98  BUN 10 11  CREATININE 0.83 0.66  CALCIUM 8.7* 9.0    PT/INR: No results for input(s): LABPROT, INR in the last 72 hours. ABG    Component Value Date/Time   PHART 7.393 08/22/2020 1142   HCO3 23.9 08/22/2020 1142   TCO2 27 09/03/2016 2007   ACIDBASEDEF 0.4 08/22/2020 1142   O2SAT 96.8 08/22/2020 1142   CBG (last 3)  No results for input(s): GLUCAP in the last 72 hours.  Assessment/Plan: S/P Procedure(s) (LRB): XI ROBOTIC ASSISTED  THORASCOPY-LOBECTOMY Right Upper Lobectomy (Right) INTERCOSTAL NERVE BLOCK (Right) NODE DISSECTION (Right)  1. CV- NSR, stable 2. Pulm- no air leak on water seal, CXR remains stable- will d/c chest tube 3. GI- no BM, continue stool softners, add miralax daily 4. Dispo- patient stable, will d/c chest tube this morning, check on patient this afternoon if she is feeling better will plan to d/c today, however she doesn't know if she will feel up to it   LOS: 2 days    Ellwood Handler, PA-C 08/28/2020   Agree with above Will remove CT today Ok for discharge if she feels well today  [name]

## 2020-08-28 NOTE — Progress Notes (Signed)
Pt chest tube removed . Pt tolerated well . Will continue to monitor   Phoebe Sharps, RN

## 2020-08-29 ENCOUNTER — Other Ambulatory Visit: Payer: Self-pay

## 2020-08-29 ENCOUNTER — Inpatient Hospital Stay (HOSPITAL_COMMUNITY): Payer: Medicare HMO

## 2020-08-29 ENCOUNTER — Encounter (HOSPITAL_COMMUNITY): Payer: Self-pay | Admitting: Thoracic Surgery (Cardiothoracic Vascular Surgery)

## 2020-08-29 MED ORDER — GUAIFENESIN ER 600 MG PO TB12: 1200.0000 mg | ORAL_TABLET | Freq: Two times a day (BID) | ORAL | Status: DC | PRN

## 2020-08-29 MED ORDER — IBUPROFEN 800 MG PO TABS: 800.0000 mg | ORAL_TABLET | Freq: Three times a day (TID) | ORAL | 0 refills | Status: DC | PRN

## 2020-08-29 NOTE — Progress Notes (Signed)
Patient discharged, AVS given with patient education provided.  All questions addressed, transported by wheelchair to [facility].

## 2020-08-29 NOTE — Progress Notes (Signed)
      [address]       [place] [address]             [phone]      3 Days Post-Op Procedure(s) (LRB): XI ROBOTIC ASSISTED THORASCOPY-LOBECTOMY Right Upper Lobectomy (Right) INTERCOSTAL NERVE BLOCK (Right) NODE DISSECTION (Right)   Subjective:  The patient is doing well.  She is having some trouble coughing up sputum.  She gets some up but no all of it.  She had several questions in regards to care/showering at home.  All addressed with patient.   Objective: Vital signs in last 24 hours: Temp:  [98.5 F (36.9 C)-99.5 F (37.5 C)] 98.5 F (36.9 C) (12/23 0358) Pulse Rate:  [67-81] 76 (12/23 0358) Cardiac Rhythm: Normal sinus rhythm (12/22 1900) Resp:  [16-24] 16 (12/23 0358) BP: (107-119)/(57-71) 119/67 (12/23 0358) SpO2:  [93 %-98 %] 96 % (12/23 0358)  Intake/Output from previous day: 12/22 0701 - 12/23 0700 In: 480 [P.O.:480] Out: -   General appearance: alert, cooperative and no distress Heart: regular rate and rhythm Lungs: diminished breath sounds right base Abdomen: soft, non-tender; bowel sounds normal; no masses,  no organomegaly Extremities: extremities normal, atraumatic, no cyanosis or edema Wound: clean and dry  Lab Results: Recent Labs    08/27/20 0500 08/28/20 0140  WBC 9.5 10.8*  HGB 10.7* 11.5*  HCT 33.6* 36.8  PLT 197 189   BMET:  Recent Labs    08/27/20 0500 08/28/20 0140  NA 140 138  K 3.6 4.0  CL 107 107  CO2 23 21*  GLUCOSE 129* 98  BUN 10 11  CREATININE 0.83 0.66  CALCIUM 8.7* 9.0    PT/INR: No results for input(s): LABPROT, INR in the last 72 hours. ABG    Component Value Date/Time   PHART 7.393 08/22/2020 1142   HCO3 23.9 08/22/2020 1142   TCO2 27 09/03/2016 2007   ACIDBASEDEF 0.4 08/22/2020 1142   O2SAT 96.8 08/22/2020 1142   CBG (last 3)  No results for input(s): GLUCAP in the last 72 hours.  Assessment/Plan: S/P Procedure(s) (LRB): XI ROBOTIC ASSISTED THORASCOPY-LOBECTOMY Right Upper  Lobectomy (Right) INTERCOSTAL NERVE BLOCK (Right) NODE DISSECTION (Right)  1. CV- hemodynamically stable 2. Pulm- no acute issues, CT removed yesterday, CXR with right sided pleural effusion, some elevation of diaphragm.. instructed patient to use Mucinex to help with sputum removal 3. Dispo- patient stable, pleural effusion/mild elevation of right side diaphragm, will plan to d/c patient home today   LOS: 3 days    Ellwood Handler, PA-C 08/29/2020

## 2020-09-03 ENCOUNTER — Ambulatory Visit (INDEPENDENT_AMBULATORY_CARE_PROVIDER_SITE_OTHER): Payer: Self-pay | Admitting: Thoracic Surgery (Cardiothoracic Vascular Surgery)

## 2020-09-03 ENCOUNTER — Other Ambulatory Visit: Payer: Self-pay

## 2020-09-03 ENCOUNTER — Encounter: Payer: Self-pay | Admitting: Thoracic Surgery (Cardiothoracic Vascular Surgery)

## 2020-09-03 VITALS — BP 127/81 | HR 82 | Resp 20 | Ht 64.0 in | Wt 137.0 lb

## 2020-09-03 DIAGNOSIS — Z902 Acquired absence of lung [part of]: Secondary | ICD-10-CM

## 2020-09-03 NOTE — Progress Notes (Signed)
[address]       [place] [address]             [phone]        Tonya [name] [place] Medical Record #[id number] Date of Birth: 15-Oct-1945  Referring: [name], DO Primary Care: Lin Landsman, MD Primary Cardiologist:No primary care provider on file.  Reason for visit:   follow-up  History of Present Illness:     [name] comes in for her 1 week follow-up appointment.  Overall she is doing well.  She does complain of some incisional pain along with some paresthesias along the access incision.  She continues using her incentive spirometer.  Physical Exam: BP 127/81 (BP Location: Left Arm, Patient Position: Sitting)   Pulse 82   Resp 20   Ht 5\' 4"  (1.626 m)   Wt 137 lb (62.1 kg)   SpO2 91% Comment: RA with mask on  BMI 23.52 kg/m   Alert NAD Incision clean.  Abdomen soft, ND No peripheral edema   Diagnostic Studies & Laboratory data:  Path: FINAL MICROSCOPIC DIAGNOSIS:   A. LYMPH NODE, LEVEL 9, EXCISION:  - No carcinoma identified in one lymph node (0/1)   B. LYMPH NODE, LEVEL 7, EXCISION:  - No carcinoma identified in one lymph node (0/1)   C. LYMPH NODE, LEVEL 7 #2, EXCISION:  - No carcinoma identified in one lymph node (0/1)   D. LYMPH NODE, HILAR, EXCISION:  - No carcinoma identified in one lymph node (0/1)   E. LYMPH NODE, HILAR #2, EXCISION:  - No carcinoma identified in one lymph node (0/1)   F. LYMPH NODE, HILAR #3, EXCISION:  - No carcinoma identified in one lymph node (0/1)   G. LUNG, RIGHT UPPER LOBE, LOBECTOMY:  - Multifocal invasive adenocarcinoma, moderately differentiated, 1.4 cm  - Carcinoma involves the pleura  - Margins uninvolved by adenocarcinoma  - See oncology table and comment below   ONCOLOGY TABLE:   LUNG: Resection   Synchronous Tumors: Not applicable  Total Number of Primary Tumors: 1  Procedure: Lobectomy  Specimen Laterality: Right  Tumor Focality: Separate  tumor nodules in same lobe  Tumor Site: Right upper  Tumor Size:    Total Tumor Size: 1.4 cm    Invasive Tumor Size (applies only to invasive nonmucinous  adenocarcinoma with a lepidic       component): 1.2 cm  Histologic Type: Invasive acinar adenocarcinoma (acinar 75%; lepidic  25%)  Visceral Pleura Invasion: Present  Direct Invasion of Adjacent Structures: Not identified  Lymphovascular Invasion: Not identified  Margins: All margins negative for invasive carcinoma    Closest Margin(s) to Invasive Carcinoma: Bronchial resection margin  Treatment Effect: No known presurgical therapy  Regional Lymph Nodes:    Number of Lymph Nodes Involved: 0               Nodal Sites with Tumor: 0    Number of Lymph Nodes Examined: 7            Nodal Sites Examined: 3  Distant Metastasis:    Distant Site(s) Involved: Not applicable  Pathologic Stage Classification (pTNM, AJCC 8th Edition): pT3, pN0    Assessment / Plan:   74 year old female with a 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  She additionally has a left upper lobe adenocarcinoma and is awaiting treatment.  Prior to surgery we discussed the the need for radiation to the left  upper lobe lesion.  I will also make referral to medical oncology for adjuvant therapy.  I will see her back in 1 month with a chest x-ray.   Tonya [name] 09/03/2020 3:50 PM

## 2020-09-04 ENCOUNTER — Encounter: Payer: Self-pay | Admitting: *Deleted

## 2020-09-04 DIAGNOSIS — R590 Localized enlarged lymph nodes: Secondary | ICD-10-CM

## 2020-09-04 NOTE — Progress Notes (Signed)
I received referral on Tonya [name] yesterday.  I updated new patient coordinator to call and schedule her to be seen on 09/11/20 labs at 1:45 and to see Dr. Julien Nordmann at 2:15.

## 2020-09-05 ENCOUNTER — Telehealth: Payer: Self-pay | Admitting: Internal Medicine

## 2020-09-05 NOTE — Telephone Encounter (Signed)
Our office has received a new pt referral from Dr. Kipp Brood for lung cancer. Mrs. Tonya [name] has been cld and scheduled to see Dr. Julien Nordmann on 1/5 at 2:15pm w/labs at 1:45pm. Pt aware to arrive 30 minutes early.

## 2020-09-09 ENCOUNTER — Telehealth: Payer: Self-pay | Admitting: *Deleted

## 2020-09-09 NOTE — Telephone Encounter (Signed)
The patient called the office stating she has not had a BM in 2 weeks s/p right robotic lobectomy 08/26/20 with Dr. Kipp Brood. Pt states she has taken Miralax three times in addition to a stool softener daily for four days. Pt states her appetite has increased and she is consuming water throughout the day. Pt states she has been increasing her physical activity daily and is currently walking 30 mins a day. Pt denies any abd pain or bloating. Pt states she is passing gas. Advised pt to continue physical activity as tolerated, eat fiber rich foods, and consume plenty of water. If unable to move bowels advised pt to reach out to her PCP. No further questions at this time.

## 2020-09-11 ENCOUNTER — Inpatient Hospital Stay: Payer: Medicare HMO | Attending: Internal Medicine | Admitting: Internal Medicine

## 2020-09-11 ENCOUNTER — Telehealth: Payer: Self-pay | Admitting: Internal Medicine

## 2020-09-11 ENCOUNTER — Encounter: Payer: Self-pay | Admitting: Emergency Medicine

## 2020-09-11 ENCOUNTER — Other Ambulatory Visit: Payer: Self-pay

## 2020-09-11 ENCOUNTER — Encounter: Payer: Self-pay | Admitting: Internal Medicine

## 2020-09-11 ENCOUNTER — Inpatient Hospital Stay: Payer: Medicare HMO

## 2020-09-11 VITALS — BP 123/73 | HR 62 | Temp 97.8°F | Resp 18 | Ht 64.0 in | Wt 134.2 lb

## 2020-09-11 DIAGNOSIS — C3491 Malignant neoplasm of unspecified part of right bronchus or lung: Secondary | ICD-10-CM

## 2020-09-11 DIAGNOSIS — C3412 Malignant neoplasm of upper lobe, left bronchus or lung: Secondary | ICD-10-CM | POA: Insufficient documentation

## 2020-09-11 DIAGNOSIS — F17200 Nicotine dependence, unspecified, uncomplicated: Secondary | ICD-10-CM

## 2020-09-11 DIAGNOSIS — C3411 Malignant neoplasm of upper lobe, right bronchus or lung: Secondary | ICD-10-CM | POA: Diagnosis not present

## 2020-09-11 DIAGNOSIS — R590 Localized enlarged lymph nodes: Secondary | ICD-10-CM

## 2020-09-11 DIAGNOSIS — Z5111 Encounter for antineoplastic chemotherapy: Secondary | ICD-10-CM | POA: Insufficient documentation

## 2020-09-11 LAB — CBC WITH DIFFERENTIAL (CANCER CENTER ONLY)
Abs Immature Granulocytes: 0.01 10*3/uL (ref 0.00–0.07)
Basophils Absolute: 0.1 10*3/uL (ref 0.0–0.1)
Basophils Relative: 1 %
Eosinophils Absolute: 0.5 10*3/uL (ref 0.0–0.5)
Eosinophils Relative: 7 %
HCT: 33.8 % — ABNORMAL LOW (ref 36.0–46.0)
Hemoglobin: 10.8 g/dL — ABNORMAL LOW (ref 12.0–15.0)
Immature Granulocytes: 0 %
Lymphocytes Relative: 25 %
Lymphs Abs: 1.7 10*3/uL (ref 0.7–4.0)
MCH: 32.1 pg (ref 26.0–34.0)
MCHC: 32 g/dL (ref 30.0–36.0)
MCV: 100.6 fL — ABNORMAL HIGH (ref 80.0–100.0)
Monocytes Absolute: 0.7 10*3/uL (ref 0.1–1.0)
Monocytes Relative: 10 %
Neutro Abs: 3.8 10*3/uL (ref 1.7–7.7)
Neutrophils Relative %: 57 %
Platelet Count: 339 10*3/uL (ref 150–400)
RBC: 3.36 MIL/uL — ABNORMAL LOW (ref 3.87–5.11)
RDW: 12 % (ref 11.5–15.5)
WBC Count: 6.6 10*3/uL (ref 4.0–10.5)
nRBC: 0 % (ref 0.0–0.2)

## 2020-09-11 LAB — CMP (CANCER CENTER ONLY)
ALT: 11 U/L (ref 0–44)
AST: 19 U/L (ref 15–41)
Albumin: 3.3 g/dL — ABNORMAL LOW (ref 3.5–5.0)
Alkaline Phosphatase: 86 U/L (ref 38–126)
Anion gap: 9 (ref 5–15)
BUN: 8 mg/dL (ref 8–23)
CO2: 25 mmol/L (ref 22–32)
Calcium: 9.5 mg/dL (ref 8.9–10.3)
Chloride: 108 mmol/L (ref 98–111)
Creatinine: 0.73 mg/dL (ref 0.44–1.00)
GFR, Estimated: >60 mL/min (ref >60)
Glucose, Bld: 93 mg/dL (ref 70–99)
Potassium: 4.1 mmol/L (ref 3.5–5.1)
Sodium: 142 mmol/L (ref 135–145)
Total Bilirubin: 0.4 mg/dL (ref 0.3–1.2)
Total Protein: 6.9 g/dL (ref 6.5–8.1)

## 2020-09-11 NOTE — Research (Signed)
[id number] - Customer service manager for the Discovery and Validation of Biomarkers for the Prediction, Diagnosis, and Management of Disease  09/11/20  Study Introduction:  [name] presented to the clinic for a new patient appointment. This Research officer, political party met withthe patientin a private exam room. She waspresented with the informed consent (version 2, revised 03/12/2020) and HIPAA (version 5, revised 08/23/2019) forms for the [facility] NSCLC specimen collection study. The patient stated she would be interested in the study, but did not want to make a decision today.    She agreed to be contacted via phone on Monday of next week.    Plan: I will plan to call her on Monday to follow up concerning the research study.    The patient was thanked for her time.  Clabe Seal Clinical Research Coordinator I  09/11/20 4:39 PM

## 2020-09-11 NOTE — Telephone Encounter (Signed)
Scheduled appointments per 1/5 los. Spoke to patient who is aware of appointments date and times.

## 2020-09-11 NOTE — Progress Notes (Signed)
[place] [phone]   [phone]  CONSULT NOTE  REFERRING PHYSICIAN: Dr. Melodie Bouillon  REASON FOR CONSULTATION:  75 years old African-American female recently diagnosed with lung cancer.  HPI Tonya [name] is a 75 y.o. female with past medical history significant for GERD, dyslipidemia, kidney stone and long history of smoking. The patient mentioned that in July 2021 she was complaining of chest pain. She was seen at the emergency department at that time and no concerning findings were found. She was referred by her primary care physician to cardiology for further evaluation and she had CT calcium score performed on June 11, 2020 and it showed suspicious nodule in the right upper lobe measuring 1.8 cm. The patient was referred to Dr. Valeta Harms and she had CT super D of the chest as well as PET scan on July 03, 2020. The scan of the chest showed multiple pulmonary nodules present. There is an enlarging spiculated solid nodule in the left upper lobe measuring 1.2 x 1.1 cm suspicious for malignancy. There was also enlarging partially solid nodule in the right upper lobe measuring 1.8 x 1.1 cm. There was another enlarging partially solid nodule more inferiorly in the right upper lobe as well as other scattered small solid and groundglass nodules measuring less than 5 mm in diameter that did not significantly change. The PET scan showed abnormal hypermetabolic activity in the 0.8 cm left upper lobe pulmonary nodule as well as a 1.7 x 1.1 cm subsolid right pulmonary nodule concerning for multifocal malignancy. The other pulmonary nodules were not appreciably hypermetabolic but they were below the sensitive PET CT size thresholds. There was also small but borderline hypermetabolic mediastinal lymph nodes along the left hilum and anterior to the SVC. There was no evidence for extrathoracic metastatic disease. On July 18, 2020 the patient underwent  bronchoscopy with endobronchial ultrasound as well as electromagnetic navigational bronchoscopy under the care of Dr. Valeta Harms. The biopsy of the right upper lobe lung nodule as well as the left upper lobe lung nodule ([id number]) and ([id number]) showed malignant cells consistent with non-small cell carcinoma, adenocarcinoma. The patient had MRI of the brain on August 16, 2020 and it was negative for malignancy. She was referred to Dr. Kipp Brood and on August 26, 2020 she underwent right upper lobectomy with lymph node sampling. The final pathology [id number]) showed multifocal invasive adenocarcinoma moderately differentiated measuring 1.4 cm invasive nonmucinous adenocarcinoma with lipidic component of 1.2 cm. There was evidence for visceral pleural invasion but the resection margins were negative for malignancy. Dr. Kipp Brood kindly referred the patient to me today for evaluation and recommendation regarding treatment of her condition. When seen today she continues to have soreness on the right side of the chest. She is currently on oxycodone, Tylenol and ibuprofen by Dr. Kipp Brood. She denied having any current shortness of breath, cough or hemoptysis. She denied having any nausea, vomiting, diarrhea but has constipation for more than a week. She denied having any headache or visual changes. Family history significant for mother with heart disease and had pacemaker. Father had heart disease. Sister also had heart disease and another sister had breast cancer. The patient is married and has 3 children. She was accompanied today by her husband [name]. 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 She has a history for smoking 1 pack/day for around 40 years and quit in July 2021. She drinks alcohol occasionally and no history of drug abuse. HPI  Past Medical History:  Diagnosis Date  . Adenoma of left adrenal gland   . Cancer (HCC)    Lung  . Colon polyps    hyperplastic  .  Complication of anesthesia    Very emotional and cries after anesthesia  . GERD (gastroesophageal reflux disease)   . Kidney stone   . Liver lesion   . Microhematuria     Past Surgical History:  Procedure Laterality Date  . ABDOMINAL HYSTERECTOMY    . APPENDECTOMY    . BRONCHIAL BIOPSY  07/18/2020   Procedure: BRONCHIAL BIOPSIES;  Surgeon: Josephine Igo, DO;  Location: [facility];  Service: Pulmonary;;  . BRONCHIAL BRUSHINGS  07/18/2020   Procedure: BRONCHIAL BRUSHINGS;  Surgeon: Josephine Igo, DO;  Location: [facility];  Service: Pulmonary;;  . BRONCHIAL NEEDLE ASPIRATION BIOPSY  07/18/2020   Procedure: BRONCHIAL NEEDLE ASPIRATION BIOPSIES;  Surgeon: Josephine Igo, DO;  Location: [facility];  Service: Pulmonary;;  . BRONCHIAL WASHINGS  07/18/2020   Procedure: BRONCHIAL WASHINGS;  Surgeon: Josephine Igo, DO;  Location: [facility];  Service: Pulmonary;;  . FIDUCIAL MARKER PLACEMENT  07/18/2020   Procedure: FIDUCIAL MARKER PLACEMENT;  Surgeon: Josephine Igo, DO;  Location: [facility];  Service: Pulmonary;;  . INTERCOSTAL NERVE BLOCK Right 08/26/2020   Procedure: INTERCOSTAL NERVE BLOCK;  Surgeon: Corliss Skains, MD;  Location: [facility];  Service: Thoracic;  Laterality: Right;  . NODE DISSECTION Right 08/26/2020   Procedure: NODE DISSECTION;  Surgeon: Corliss Skains, MD;  Location: [facility];  Service: Thoracic;  Laterality: Right;  Marland Kitchen VIDEO BRONCHOSCOPY WITH ENDOBRONCHIAL NAVIGATION N/A 07/18/2020   Procedure: VIDEO BRONCHOSCOPY WITH ENDOBRONCHIAL NAVIGATION;  Surgeon: Josephine Igo, DO;  Location: [facility];  Service: Pulmonary;  Laterality: N/A;  . VIDEO BRONCHOSCOPY WITH ENDOBRONCHIAL ULTRASOUND  07/18/2020   Procedure: VIDEO BRONCHOSCOPY WITH ENDOBRONCHIAL ULTRASOUND;  Surgeon: Josephine Igo, DO;  Location: [facility];  Service: Pulmonary;;    Family History  Problem Relation Age of Onset  . Arrhythmia Mother        s/p Pacer   . Heart  attack Father   . Heart disease Sister   . Colon cancer Neg Hx     Social History Social History   Tobacco Use  . Smoking status: Former Smoker    Packs/day: 0.30    Years: 60.00    Pack years: 18.00    Types: Cigarettes    Quit date: 06/11/2020    Years since quitting: 0.2  . Smokeless tobacco: Never Used  Vaping Use  . Vaping Use: Never used  Substance Use Topics  . Alcohol use: Yes    Comment: 0.5 drink per month  . Drug use: No    Allergies  Allergen Reactions  . Penicillins Hives    REACTION: 20 years    Current Outpatient Medications  Medication Sig Dispense Refill  . acetaminophen (TYLENOL) 500 MG tablet Take 2 tablets (1,000 mg total) by mouth every 6 (six) hours as needed. 30 tablet 0  . albuterol (VENTOLIN HFA) 108 (90 Base) MCG/ACT inhaler Inhale 1-2 puffs into the lungs every 4 (four) hours as needed for wheezing or shortness of breath.     Marland Kitchen alendronate (FOSAMAX) 70 MG tablet Take 70 mg by mouth every Saturday.    . ALPRAZolam (XANAX) 1 MG tablet Take 1 mg by mouth 3 (three) times daily as needed for anxiety or sleep.    . Calcium Carbonate-Vitamin D (CALCIUM + D PO) Take 600 mg  by mouth 2 (two) times daily.    . cyclobenzaprine (FLEXERIL) 5 MG tablet Take 5 mg by mouth 3 (three) times daily as needed for muscle spasms (sciatic nerve).     . gabapentin (NEURONTIN) 100 MG capsule Take 100 mg by mouth at bedtime.    Marland Kitchen guaiFENesin (MUCINEX) 600 MG 12 hr tablet Take 2 tablets (1,200 mg total) by mouth 2 (two) times daily as needed.    Marland Kitchen ibuprofen (ADVIL) 800 MG tablet Take 1 tablet (800 mg total) by mouth every 8 (eight) hours as needed for moderate pain. 30 tablet 0  . lidocaine (LIDODERM) 5 % Place 1 patch onto the skin daily as needed. Apply patch to area most significant pain once per day.  Remove and discard patch within 12 hours of application. (Patient taking differently: Place 1 patch onto the skin daily as needed (Pain). Apply patch to area most significant  pain once per day.  Remove and discard patch within 12 hours of application.) 30 patch 0  . Multiple Vitamin (MULTIVITAMIN WITH MINERALS) TABS Take 1 tablet by mouth daily.    . naproxen (NAPROSYN) 375 MG tablet Take 1 tablet (375 mg total) by mouth 2 (two) times daily as needed for moderate pain. 8 tablet 0  . omeprazole (PRILOSEC) 40 MG capsule Take 40 mg by mouth 2 (two) times daily.    Marland Kitchen oxyCODONE-acetaminophen (PERCOCET) 10-325 MG tablet Take 1 tablet by mouth every 4 (four) hours as needed for moderate pain.     . rosuvastatin (CRESTOR) 20 MG tablet Take 1 tablet (20 mg total) by mouth daily. (Patient taking differently: Take 10 mg by mouth at bedtime.) 30 tablet 0  . Tiotropium Bromide-Olodaterol (STIOLTO RESPIMAT) 2.5-2.5 MCG/ACT AERS Inhale 2 puffs into the lungs daily. (Patient taking differently: Inhale 2 puffs into the lungs daily as needed (Shortness of breath).) 4 g 0   No current facility-administered medications for this visit.    Review of Systems  Constitutional: positive for fatigue Eyes: negative Ears, nose, mouth, throat, and face: negative Respiratory: positive for pleurisy/chest pain Cardiovascular: negative Gastrointestinal: positive for constipation Genitourinary:negative Integument/breast: negative Hematologic/lymphatic: negative Musculoskeletal:negative Neurological: negative Behavioral/Psych: negative Endocrine: negative Allergic/Immunologic: negative  Physical Exam  ZCK:ICHTV, healthy, no distress, well nourished, well developed and anxious SKIN: skin color, texture, turgor are normal, no rashes or significant lesions HEAD: Normocephalic, No masses, lesions, tenderness or abnormalities EYES: normal, PERRLA, Conjunctiva are pink and non-injected EARS: External ears normal, Canals clear OROPHARYNX:no exudate, no erythema and lips, buccal mucosa, and tongue normal  NECK: supple, no adenopathy, no JVD LYMPH:  no palpable lymphadenopathy, no  hepatosplenomegaly BREAST:not examined LUNGS: clear to auscultation , and palpation HEART: regular rate & rhythm, no murmurs and no gallops ABDOMEN:abdomen soft, non-tender, normal bowel sounds and no masses or organomegaly BACK: No CVA tenderness, Range of motion is normal EXTREMITIES:no joint deformities, effusion, or inflammation, no edema  NEURO: alert & oriented x 3 with fluent speech, no focal motor/sensory deficits  PERFORMANCE STATUS: ECOG 1  LABORATORY DATA: Lab Results  Component Value Date   WBC 6.6 09/11/2020   HGB 10.8 (L) 09/11/2020   HCT 33.8 (L) 09/11/2020   MCV 100.6 (H) 09/11/2020   PLT 339 09/11/2020      Chemistry      Component Value Date/Time   NA 142 09/11/2020 1401   K 4.1 09/11/2020 1401   CL 108 09/11/2020 1401   CO2 25 09/11/2020 1401   BUN 8 09/11/2020 1401   CREATININE  0.73 09/11/2020 1401      Component Value Date/Time   CALCIUM 9.5 09/11/2020 1401   ALKPHOS 86 09/11/2020 1401   AST 19 09/11/2020 1401   ALT 11 09/11/2020 1401   BILITOT 0.4 09/11/2020 1401       RADIOGRAPHIC STUDIES: DG Chest 2 View  Result Date: 08/29/2020 CLINICAL DATA:  Follow-up chest tube removal EXAM: CHEST - 2 VIEW COMPARISON:  Radiograph 08/28/2020 FINDINGS: Normal cardiac silhouette. Interval removal of RIGHT chest tube. Trace RIGHT apical pneumothorax is present measuring 2 mm from the superolateral RIGHT chest wall. Surgical clips about the RIGHT hilum. RIGHT basilar atelectasis. LEFT lung clear. Fiducial markers noted in the LEFT upper lobe. IMPRESSION: 1. Trace RIGHT apical pneumothorax following chest tube removal. 2. Postsurgical change in the RIGHT hemithorax. These results will be called to the ordering clinician or representative by the Radiologist Assistant, and communication documented in the PACS or [employer]. Electronically Signed   By: Suzy Bouchard M.D.   On: 08/29/2020 09:21   DG Chest 2 View  Result Date: 08/26/2020 CLINICAL DATA:   Preop for lobectomy EXAM: CHEST - 2 VIEW COMPARISON:  07/18/2020 FINDINGS: Fiducial markers over the bilateral upper lobes. There is no edema, consolidation, effusion, or pneumothorax. Aortic tortuosity. Normal heart size. Scoliosis of the thoracic and lumbar spine. IMPRESSION: No evidence of acute disease. Electronically Signed   By: Monte Fantasia M.D.   On: 08/26/2020 06:41   MR Brain W Wo Contrast  Result Date: 08/16/2020 CLINICAL DATA:  Non-small cell lung cancer. EXAM: MRI HEAD WITHOUT AND WITH CONTRAST TECHNIQUE: Multiplanar, multiecho pulse sequences of the brain and surrounding structures were obtained without and with intravenous contrast. CONTRAST:  42mL GADAVIST GADOBUTROL 1 MMOL/ML IV SOLN COMPARISON:  06/30/2012 FINDINGS: Brain: There is no evidence of an acute infarct, intracranial hemorrhage, mass, midline shift, or extra-axial fluid collection. The ventricles and sulci are normal. The brain is normal in signal. No abnormal enhancement is identified. Vascular: Major intracranial vascular flow voids are preserved. Skull and upper cervical spine: Unremarkable bone marrow signal. Sinuses/Orbits: Unremarkable orbits. Clear paranasal sinuses. Small chronic right mastoid effusion. Other: None. IMPRESSION: Unremarkable appearance of the brain. No evidence of intracranial metastases. Electronically Signed   By: Logan Bores M.D.   On: 08/16/2020 08:47   DG CHEST PORT 1 VIEW  Result Date: 08/28/2020 CLINICAL DATA:  Status post right lobectomy with chest tube in place EXAM: PORTABLE CHEST 1 VIEW COMPARISON:  August 27, 2020 FINDINGS: Postoperative change noted on the right with volume loss. Chest tube present on the right without pneumothorax. Central catheter no longer present. There is atelectatic change in the right mid lung and right base regions. Left lung clear. Heart is mildly enlarged with pulmonary vascularity normal, stable. There is aortic atherosclerosis. No adenopathy. There is lower  thoracic levoscoliosis. IMPRESSION: Postoperative change on the right with volume loss in the right mid and lower lung atelectatic change. No pneumothorax. Chest tube position unchanged. Left lung clear.  Stable cardiac silhouette. Aortic Atherosclerosis (ICD10-I70.0). Electronically Signed   By: Lowella Grip III M.D.   On: 08/28/2020 08:11   DG CHEST PORT 1 VIEW  Result Date: 08/27/2020 CLINICAL DATA:  Lobectomy and chest tube on the right EXAM: PORTABLE CHEST 1 VIEW COMPARISON:  Yesterday FINDINGS: Stable postoperative changes on the right with volume loss. Unchanged right-sided chest tube with tip at the apex. Right IJ line with tip at the upper cavoatrial junction. Stable heart size and aortic contours. Clear left  lung with fiducial markers over the apex. IMPRESSION: Stable postoperative chest.  No visible pneumothorax. Electronically Signed   By: Monte Fantasia M.D.   On: 08/27/2020 06:18   DG Chest Port 1 View  Result Date: 08/26/2020 CLINICAL DATA:  RIGHT upper lobectomy EXAM: PORTABLE CHEST 1 VIEW COMPARISON:  Radiograph 08/26/2020 FINDINGS: RIGHT chest tube in place without pneumothorax. Volume loss in the RIGHT hemithorax. RIGHT hilar density following surgery is favored atelectasis. Central venous line with tip in distal SVC. IMPRESSION: RIGHT upper lobectomy without complication. Electronically Signed   By: Suzy Bouchard M.D.   On: 08/26/2020 11:18    ASSESSMENT: This is a very pleasant 75 years old African-American female recently diagnosed with at least stage IIB (T3, N0, M0) non-small cell lung cancer, adenocarcinoma involving the right upper lobe. The patient also has evidence for adenocarcinoma of the left upper lobe lung nodule as well as subcentimeter pulmonary nodules in the right lung concerning for stage IV lung cancer with multifocal disease bilaterally. She is status post right upper lobectomy with lymph node sampling on August 26, 2020 under the care of Dr.  Kipp Brood.   PLAN: I had a lengthy discussion with the patient and her husband today about her current condition as well as treatment options. I personally and independently reviewed the scan images and discussed the result and showed the images to the patient and her husband. Unfortunately the patient has multifocal disease and she likely has a stage IV lung cancer involving the right upper lobe, right middle and lower lobes as well as left upper lobe. She has surgical resection with right upper lobectomy that showed at least stage IIb non-small cell lung cancer, adenocarcinoma. I recommended for the patient to send her tissue block for molecular studies to see if the patient has any actionable mutations. If the patient has an actionable mutations, I would consider her for treatment with targeted therapy with close monitoring of the other pulmonary nodules. If the patient has no actionable mutations I would consider referring her to radiation oncology for SBRT of the left upper lobe lung nodule that has been confirmed to be non-small cell lung cancer, adenocarcinoma. I would also consider the patient for adjuvant systemic chemotherapy with or without immunotherapy depending on the PD-L1 expression. I will see the patient back for follow-up visit in around 3 weeks for evaluation and discussion of her treatment options in more details based on the molecular studies. I advised the patient to continue with the smoking cessation. She was advised to call immediately if she has any other concerning symptoms in the interval.  The patient voices understanding of current disease status and treatment options and is in agreement with the current care plan.  All questions were answered. The patient knows to call the clinic with any problems, questions or concerns. We can certainly see the patient much sooner if necessary.  Thank you so much for allowing me to participate in the care of Avni Traore Brown-Herbin.  I will continue to follow up the patient with you and assist in her care.  The total time spent in the appointment was 90 minutes.  Disclaimer: This note was dictated with voice recognition software. Similar sounding words can inadvertently be transcribed and may not be corrected upon review.   Eilleen Kempf September 11, 2020, 2:53 PM

## 2020-09-13 ENCOUNTER — Encounter: Payer: Self-pay | Admitting: *Deleted

## 2020-09-13 NOTE — Progress Notes (Signed)
Per Dr. Julien Nordmann, I notified pathology dept to send molecular and PDL 1 testing on Mr. Coletta recent surgery to [facility].

## 2020-09-16 ENCOUNTER — Telehealth: Payer: Self-pay | Admitting: Emergency Medicine

## 2020-09-16 NOTE — Telephone Encounter (Signed)
[id number] - Customer service manager for the Discovery and Validation of Biomarkers for the Prediction, Diagnosis, and Management of Disease  09/16/20   Called patient to follow up concerning the research study. The patient states she has not had time to look over the forms she was given.  She requested I call back on Thursday or Friday of this week.  She was thanked for her time.  Clabe Seal Clinical Research Coordinator I  09/16/20  4:46 PM

## 2020-09-18 ENCOUNTER — Encounter: Payer: Self-pay | Admitting: *Deleted

## 2020-09-18 NOTE — Progress Notes (Signed)
I followed up on Tonya [name] [facility] report. I was unable to find they have received tissue.  I contacted pathology dept and waiting for response.

## 2020-09-19 NOTE — Progress Notes (Incomplete)
Thoracic Location of Tumor / Histology: Right Lung  Patient presented in October 2021 after workup for chest pain by cardiologist.  Incidental finding of nodule on right lung.  Biopsies of right lung and lymph nodes 08/28/4113  (if applicable) revealed:     Tobacco/Marijuana/Snuff/ETOH use: {:18581}  Past/Anticipated interventions by cardiothoracic surgery, if any: Bronchoscopy, bronchial washings/biopsy/ fiducial markers placed and node dissection.  Past/Anticipated interventions by medical oncology, if any: Per Dr. Julien Nordmann consider the patient for adjuvant systemic chemotherapy with or without immunotherapy depending on the PD-L1 expression.  Signs/Symptoms  Weight changes, if any: {:18581}  Respiratory complaints, if any: {:18581}  Hemoptysis, if any: {:18581}  Pain issues, if any:  {:18581}  SAFETY ISSUES:  Prior radiation? {:18581}  Pacemaker/ICD? {:18581}   Possible current pregnancy?{:18581}  Is the patient on methotrexate? {:18581}  Current Complaints / other details:  ***

## 2020-09-20 ENCOUNTER — Telehealth: Payer: Self-pay | Admitting: Emergency Medicine

## 2020-09-20 NOTE — Telephone Encounter (Signed)
[id number] - Customer service manager for the Discovery and Validation of Biomarkers for the Prediction, Diagnosis, and Management of Disease  09/20/20   Outgoing Call: Patient states she has had time to ready the informed consent form and HIPAA authorization for this study.  She is interested in participating, but does not want to schedule an extra lab appointment for research labs only.  Will plan to follow up with her on Monday after her radiation oncology appointment to review the consent form.     She was thanked for her time.  Clabe Seal Clinical Research Coordinator I  09/20/20  2:28 PM

## 2020-09-23 ENCOUNTER — Ambulatory Visit
Admission: RE | Admit: 2020-09-23 | Discharge: 2020-09-23 | Disposition: A | Discharge status: Home / Self Care | Payer: Medicare HMO | Source: Ambulatory Visit | Attending: Radiation Oncology | Admitting: Radiation Oncology

## 2020-09-23 ENCOUNTER — Ambulatory Visit
Admit: 2020-09-23 | Discharge: 2020-09-23 | Disposition: A | Discharge status: Home / Self Care | Payer: Medicare HMO | Attending: Radiation Oncology | Admitting: Radiation Oncology

## 2020-09-23 ENCOUNTER — Encounter: Payer: Self-pay | Admitting: Emergency Medicine

## 2020-09-23 ENCOUNTER — Other Ambulatory Visit: Payer: Self-pay

## 2020-09-23 VITALS — BP 115/68 | HR 55 | Temp 96.8°F | Resp 18 | Ht 64.0 in | Wt 132.5 lb

## 2020-09-23 DIAGNOSIS — C3491 Malignant neoplasm of unspecified part of right bronchus or lung: Secondary | ICD-10-CM

## 2020-09-23 NOTE — Progress Notes (Signed)
Radiation Oncology         [phone] ________________________________  Initial Outpatient Consultation  Name: Tonya [name] MRN: [id number]  Date: 09/23/2020  DOB: 1946/05/11  NW:GNFAO, Zadie Cleverly, MD  [name], DO   REFERRING PHYSICIAN: [name], DO  DIAGNOSIS: The encounter diagnosis was Adenocarcinoma of right lung, stage 2 ([place]).  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 The patient also has evidence of adenocarcinoma of a left upper lobe lung nodule in addition to subcentimeter pulmonary nodules in the right lung, concerning for stage IV lung cancer with multifocal disease bilaterally.  HISTORY OF PRESENT ILLNESS::Tonya [name] is a 75 y.o. female who is seen as a courtesy of Dr. Valeta Harms for an opinion concerning radiation therapy as part of management for her recently diagnosed lung cancer. The patient underwent a chest CT scan (CT cardiac calcium scoring) on 06/12/2020 for chest pain and increased cholesterol levels. Results showed a suspicious 1.8 cm spiculated nodule in the right upper lobe that was concerning for primary lung malignancy in the setting of emphysema.     Super D chest CT scan on 07/03/2020 showed an enlarging spiculated solid nodule in the left upper lobe and enlarging part solid nodules in the right upper lobe, suspicious for malignancy. There was no evidence of metastatic disease.  PET scan on 07/03/2020 showed abnormal hypermetabolic activity in the 8 mm left upper lobe pulmonary nodule and in the 1.7 x 1.0 cm sub solid right pulmonary nodule, concerning for multifocal malignancy. The other pulmonary nodules were not appreciably hypermetabolic, but were below sensitive PET-CT size thresholds. There were also noted to be small, but borderline, hypermetabolic mediastinal lymph nodes along the left hilum and anterior to the SVC.  Subsequently, the patient was seen by Dr. Valeta Harms, who  performed a video bronchoscopy with electromagnetic navigation on 07/18/2020. Cytology from the procedure revealed malignant cells consistent with non-small cell carcinoma of the right upper lobe brushings and needle aspiration. Left upper lobe needle aspirations showed malignant cells consistent with adenocarcinoma and left upper lobe brushing was suspicious for malignancy.  MRI of brain on 08/16/2020 did not show any evidence of intracranial metastases.  The patient underwent a robotic assisted right video thoracoscopy, right upper lobectomy, and mediastinal lymph node sampling on 08/26/2020 under the care of Dr. Kipp Brood. Pathology from the procedure revealed multifocal invasive adenocarcinoma, moderately differentiated, 1.4 cm. Carcinoma involved the pleura, but margins were uninvolved by adenocarcinoma. Six lymph nodes were excised in total, all of which were negative for carcinoma.  The patient was seen by Dr. Julien Nordmann on 09/21/2020. It was recommended that tissue be sent for molecular studies. Results would determinate whether the patient would undergo treatment with targeted therapy and close monitoring vs SBRT of the confirmed left upper lobe nodule. Adjuvant systemic therapy with or without immunotherapy was also discussed.  PREVIOUS RADIATION THERAPY: No  PAST MEDICAL HISTORY:  Past Medical History:  Diagnosis Date  . Adenoma of left adrenal gland   . Cancer ([place])    Lung  . Colon polyps    hyperplastic  . Complication of anesthesia    Very emotional and cries after anesthesia  . GERD (gastroesophageal reflux disease)   . Kidney stone   . Liver lesion   . Microhematuria     PAST SURGICAL HISTORY: Past Surgical History:  Procedure Laterality Date  . ABDOMINAL HYSTERECTOMY    . APPENDECTOMY    . BRONCHIAL BIOPSY  07/18/2020   Procedure: BRONCHIAL BIOPSIES;  Surgeon: [name], DO;  Location: [facility];  Service: Pulmonary;;  . BRONCHIAL BRUSHINGS  07/18/2020    Procedure: BRONCHIAL BRUSHINGS;  Surgeon: [name], DO;  Location: [facility];  Service: Pulmonary;;  . BRONCHIAL NEEDLE ASPIRATION BIOPSY  07/18/2020   Procedure: BRONCHIAL NEEDLE ASPIRATION BIOPSIES;  Surgeon: [name], DO;  Location: [facility];  Service: Pulmonary;;  . BRONCHIAL WASHINGS  07/18/2020   Procedure: BRONCHIAL WASHINGS;  Surgeon: [name], DO;  Location: [facility];  Service: Pulmonary;;  . FIDUCIAL MARKER PLACEMENT  07/18/2020   Procedure: FIDUCIAL MARKER PLACEMENT;  Surgeon: [name], DO;  Location: [facility];  Service: Pulmonary;;  . INTERCOSTAL NERVE BLOCK Right 08/26/2020   Procedure: INTERCOSTAL NERVE BLOCK;  Surgeon: [name], MD;  Location: [facility];  Service: Thoracic;  Laterality: Right;  . NODE DISSECTION Right 08/26/2020   Procedure: NODE DISSECTION;  Surgeon: [name], MD;  Location: [facility];  Service: Thoracic;  Laterality: Right;  Marland Kitchen VIDEO BRONCHOSCOPY WITH ENDOBRONCHIAL NAVIGATION N/A 07/18/2020   Procedure: VIDEO BRONCHOSCOPY WITH ENDOBRONCHIAL NAVIGATION;  Surgeon: [name], DO;  Location: [facility];  Service: Pulmonary;  Laterality: N/A;  . VIDEO BRONCHOSCOPY WITH ENDOBRONCHIAL ULTRASOUND  07/18/2020   Procedure: VIDEO BRONCHOSCOPY WITH ENDOBRONCHIAL ULTRASOUND;  Surgeon: [name], DO;  Location: [facility];  Service: Pulmonary;;    FAMILY HISTORY:  Family History  Problem Relation Age of Onset  . Arrhythmia Mother        s/p Pacer   . Heart attack Father   . Heart disease Sister   . Colon cancer Neg Hx     SOCIAL HISTORY:  Social History   Tobacco Use  . Smoking status: Former Smoker    Packs/day: 0.30    Years: 60.00    Pack years: 18.00    Types: Cigarettes    Quit date: 06/11/2020    Years since quitting: 0.2  . Smokeless tobacco: Never Used  Vaping Use  . Vaping Use: Never used  Substance Use Topics  . Alcohol use: Yes    Comment: 0.5 drink per month  .  Drug use: No    ALLERGIES:  Allergies  Allergen Reactions  . Penicillins Hives    REACTION: 20 years    MEDICATIONS:  Current Outpatient Medications  Medication Sig Dispense Refill  . acetaminophen (TYLENOL) 500 MG tablet Take 2 tablets (1,000 mg total) by mouth every 6 (six) hours as needed. 30 tablet 0  . albuterol (VENTOLIN HFA) 108 (90 Base) MCG/ACT inhaler Inhale 1-2 puffs into the lungs every 4 (four) hours as needed for wheezing or shortness of breath.     Marland Kitchen alendronate (FOSAMAX) 70 MG tablet Take 70 mg by mouth every Saturday.    . ALPRAZolam (XANAX) 1 MG tablet Take 1 mg by mouth 3 (three) times daily as needed for anxiety or sleep.    . Calcium Carbonate-Vitamin D (CALCIUM + D PO) Take 600 mg by mouth 2 (two) times daily.    . cyclobenzaprine (FLEXERIL) 5 MG tablet Take 5 mg by mouth 3 (three) times daily as needed for muscle spasms (sciatic nerve).     . gabapentin (NEURONTIN) 100 MG capsule Take 100 mg by mouth at bedtime.    Marland Kitchen guaiFENesin (MUCINEX) 600 MG 12 hr tablet Take 2 tablets (1,200 mg total) by mouth 2 (two) times daily as needed.    Marland Kitchen ibuprofen (ADVIL) 800 MG tablet Take  1 tablet (800 mg total) by mouth every 8 (eight) hours as needed for moderate pain. 30 tablet 0  . lidocaine (LIDODERM) 5 % Place 1 patch onto the skin daily as needed. Apply patch to area most significant pain once per day.  Remove and discard patch within 12 hours of application. (Patient taking differently: Place 1 patch onto the skin daily as needed (Pain). Apply patch to area most significant pain once per day.  Remove and discard patch within 12 hours of application.) 30 patch 0  . Multiple Vitamin (MULTIVITAMIN WITH MINERALS) TABS Take 1 tablet by mouth daily.    . naproxen (NAPROSYN) 375 MG tablet Take 1 tablet (375 mg total) by mouth 2 (two) times daily as needed for moderate pain. 8 tablet 0  . omeprazole (PRILOSEC) 40 MG capsule Take 40 mg by mouth 2 (two) times daily.    Marland Kitchen  oxyCODONE-acetaminophen (PERCOCET) 10-325 MG tablet Take 1 tablet by mouth every 4 (four) hours as needed for moderate pain.     . rosuvastatin (CRESTOR) 20 MG tablet Take 1 tablet (20 mg total) by mouth daily. (Patient taking differently: Take 10 mg by mouth at bedtime.) 30 tablet 0  . Tiotropium Bromide-Olodaterol (STIOLTO RESPIMAT) 2.5-2.5 MCG/ACT AERS Inhale 2 puffs into the lungs daily. (Patient taking differently: Inhale 2 puffs into the lungs daily as needed (Shortness of breath).) 4 g 0   No current facility-administered medications for this encounter.    REVIEW OF SYSTEMS:  A 10+ POINT REVIEW OF SYSTEMS WAS OBTAINED including neurology, dermatology, psychiatry, cardiac, respiratory, lymph, extremities, GI, GU, musculoskeletal, constitutional, reproductive, HEENT.  Patient has soreness along the right chest wall area from her surgery and some fatigue but overall is making good progress since her thoracotomy.   PHYSICAL EXAM:  height is 5\' 4"  (1.626 m) and weight is 132 lb 8 oz (60.1 kg). Her temporal temperature is 96.8 F (36 C) (abnormal). Her blood pressure is 115/68 and her pulse is 55 (abnormal). Her respiration is 18 and oxygen saturation is 97%.   General: Alert and oriented, in no acute distress HEENT: Head is normocephalic. Extraocular movements are intact.  Neck: Neck is supple, no palpable cervical or supraclavicular lymphadenopathy. Heart: Regular in rate and rhythm with no murmurs, rubs, or gallops. Chest: Clear to auscultation bilaterally, with no rhonchi, wheezes, or rales.  Small thoracotomy scar present along the right lower lateral chest region without signs of drainage or infection.  The patient also has some smaller scars from her drain sites all healing well Abdomen: Soft, nontender, nondistended, with no rigidity or guarding. Extremities: No cyanosis or edema. Lymphatics: see Neck Exam Skin: No concerning lesions. Musculoskeletal: symmetric strength and muscle tone  throughout. Neurologic: Cranial nerves II through XII are grossly intact. No obvious focalities. Speech is fluent. Coordination is intact. Psychiatric: Judgment and insight are intact. Affect is appropriate.   ECOG = 1  0 - Asymptomatic (Fully active, able to carry on all predisease activities without restriction)  1 - Symptomatic but completely ambulatory (Restricted in physically strenuous activity but ambulatory and able to carry out work of a light or sedentary nature. For example, light housework, office work)  2 - Symptomatic, <50% in bed during the day (Ambulatory and capable of all self care but unable to carry out any work activities. Up and about more than 50% of waking hours)  3 - Symptomatic, >50% in bed, but not bedbound (Capable of only limited self-care, confined to bed or chair 50%  or more of waking hours)  4 - Bedbound (Completely disabled. Cannot carry on any self-care. Totally confined to bed or chair)  5 - Death   Eustace Pen MM, Creech RH, Tormey DC, et al. [phone]). "Toxicity and response criteria of the [facility]". [place] Oncol. 5 (6): 649-55  LABORATORY DATA:  Lab Results  Component Value Date   WBC 6.6 09/11/2020   HGB 10.8 (L) 09/11/2020   HCT 33.8 (L) 09/11/2020   MCV 100.6 (H) 09/11/2020   PLT 339 09/11/2020   NEUTROABS 3.8 09/11/2020   Lab Results  Component Value Date   NA 142 09/11/2020   K 4.1 09/11/2020   CL 108 09/11/2020   CO2 25 09/11/2020   GLUCOSE 93 09/11/2020   CREATININE 0.73 09/11/2020   CALCIUM 9.5 09/11/2020      RADIOGRAPHY: DG Chest 2 View  Result Date: 08/29/2020 CLINICAL DATA:  Follow-up chest tube removal EXAM: CHEST - 2 VIEW COMPARISON:  Radiograph 08/28/2020 FINDINGS: Normal cardiac silhouette. Interval removal of RIGHT chest tube. Trace RIGHT apical pneumothorax is present measuring 2 mm from the superolateral RIGHT chest wall. Surgical clips about the RIGHT hilum. RIGHT basilar atelectasis. LEFT  lung clear. Fiducial markers noted in the LEFT upper lobe. IMPRESSION: 1. Trace RIGHT apical pneumothorax following chest tube removal. 2. Postsurgical change in the RIGHT hemithorax. These results will be called to the ordering clinician or representative by the Radiologist Assistant, and communication documented in the PACS or [employer]. Electronically Signed   By: Suzy Bouchard M.D.   On: 08/29/2020 09:21   DG Chest 2 View  Result Date: 08/26/2020 CLINICAL DATA:  Preop for lobectomy EXAM: CHEST - 2 VIEW COMPARISON:  07/18/2020 FINDINGS: Fiducial markers over the bilateral upper lobes. There is no edema, consolidation, effusion, or pneumothorax. Aortic tortuosity. Normal heart size. Scoliosis of the thoracic and lumbar spine. IMPRESSION: No evidence of acute disease. Electronically Signed   By: Monte Fantasia M.D.   On: 08/26/2020 06:41   DG CHEST PORT 1 VIEW  Result Date: 08/28/2020 CLINICAL DATA:  Status post right lobectomy with chest tube in place EXAM: PORTABLE CHEST 1 VIEW COMPARISON:  August 27, 2020 FINDINGS: Postoperative change noted on the right with volume loss. Chest tube present on the right without pneumothorax. Central catheter no longer present. There is atelectatic change in the right mid lung and right base regions. Left lung clear. Heart is mildly enlarged with pulmonary vascularity normal, stable. There is aortic atherosclerosis. No adenopathy. There is lower thoracic levoscoliosis. IMPRESSION: Postoperative change on the right with volume loss in the right mid and lower lung atelectatic change. No pneumothorax. Chest tube position unchanged. Left lung clear.  Stable cardiac silhouette. Aortic Atherosclerosis (ICD10-I70.0). Electronically Signed   By: Lowella Grip III M.D.   On: 08/28/2020 08:11   DG CHEST PORT 1 VIEW  Result Date: 08/27/2020 CLINICAL DATA:  Lobectomy and chest tube on the right EXAM: PORTABLE CHEST 1 VIEW COMPARISON:  Yesterday FINDINGS:  Stable postoperative changes on the right with volume loss. Unchanged right-sided chest tube with tip at the apex. Right IJ line with tip at the upper cavoatrial junction. Stable heart size and aortic contours. Clear left lung with fiducial markers over the apex. IMPRESSION: Stable postoperative chest.  No visible pneumothorax. Electronically Signed   By: Monte Fantasia M.D.   On: 08/27/2020 06:18   DG Chest Port 1 View  Result Date: 08/26/2020 CLINICAL DATA:  RIGHT upper lobectomy EXAM: PORTABLE CHEST  1 VIEW COMPARISON:  Radiograph 08/26/2020 FINDINGS: RIGHT chest tube in place without pneumothorax. Volume loss in the RIGHT hemithorax. RIGHT hilar density following surgery is favored atelectasis. Central venous line with tip in distal SVC. IMPRESSION: RIGHT upper lobectomy without complication. Electronically Signed   By: Suzy Bouchard M.D.   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  She denies being on oxygen after her surgery or at home.  The patient has at least stage IIb adenocarcinoma of the lung.  I spoke with Dr. Julien Nordmann today and results from targeted testing are pending at this time.  If the patient is a candidate for targeted therapy (has actionable mutations) then she will likely proceed with this therapy and we will hold off on SBRT  to the biopsy-proven left upper lobe lesion.  If however the patient is not a candidate for targeted therapy then we will plan on treating the left upper lobe lesion with stereotactic body radiation therapy followed by observation, immunotherapy or possibly systemic chemotherapy.  Today, I talked to the patient  about the findings and work-up thus far.  We discussed the natural history of lung cancer and general treatment, highlighting the role of radiotherapy (SBRT) in the management.  We discussed the  available radiation techniques, and focused on the details of logistics and delivery.  We reviewed the anticipated acute and late sequelae associated with radiation in this setting.  The patient was encouraged to ask questions that I answered to the best of my ability.   PLAN: Overall treatment plan is pending results from targeted therapy evaluation.  I have spoken with Dr. Julien Nordmann and he will let me know results of this testing.  As above she will undergo SBRT directed to the left upper lobe lesion if the patient does not have any actionable mutations.  Total time spent in this encounter was 60 minutes which included reviewing the patient's most recent chest CT scans, PET scan, brain MRI, bronchoscopy, lobectomy, thoracoscopy, physical examination, cytology/pathology reports, consultations, follow-ups, and documentation.  ------------------------------------------------  Blair Promise, PhD, MD  This document serves as a record of services personally performed by Gery Pray, MD. It was created on his behalf by Clerance Lav, a trained medical scribe. The creation of this record is based on the scribe's personal observations and the provider's statements to them. This document has been checked and approved by the attending provider.

## 2020-09-23 NOTE — Research (Signed)
Tonya [name] 1694 NSCLC - Customer service manager for the Discovery and Validation of Biomarkers for the Prediction, Diagnosis, and Management of Disease  09/23/20   [name] presented to the clinic for a consult with Dr. Sondra Come in radiation oncology. This Research officer, political party met withthe patientin a private exam room. The patient had some questions about the informed consent form and HIPAA authorization which were answered by this research coordinator.  The patient stated she was still interested in the study, but wanted to wait to sign the forms until the day she was having labs drawn.  She wanted to have research labs drawn only on a day that she is already having routine labs.  She agreed to come in at 8:45am on Thursday 10/10/2020 prior to her lab appointment at 9:15am to review and sign the forms.  Plan: Will call patient on Monday 10/07/2020 to confirm she is still interested, or sooner if patient's schedule changes.  She is aware that research labs would need to be drawn prior to any cancer treatment except for surgery.  The patient was thanked for her time.  Clabe Seal Clinical Research Coordinator I  09/23/20 1:22 PM

## 2020-10-02 ENCOUNTER — Encounter (HOSPITAL_COMMUNITY): Payer: Self-pay

## 2020-10-03 ENCOUNTER — Other Ambulatory Visit: Payer: Self-pay | Admitting: Thoracic Surgery (Cardiothoracic Vascular Surgery)

## 2020-10-03 DIAGNOSIS — C3491 Malignant neoplasm of unspecified part of right bronchus or lung: Secondary | ICD-10-CM

## 2020-10-04 ENCOUNTER — Encounter: Payer: Self-pay | Admitting: Thoracic Surgery (Cardiothoracic Vascular Surgery)

## 2020-10-04 ENCOUNTER — Ambulatory Visit
Admission: RE | Admit: 2020-10-04 | Discharge: 2020-10-04 | Disposition: A | Discharge status: Home / Self Care | Payer: Medicare HMO | Source: Ambulatory Visit | Attending: Thoracic Surgery (Cardiothoracic Vascular Surgery) | Admitting: Thoracic Surgery (Cardiothoracic Vascular Surgery)

## 2020-10-04 ENCOUNTER — Other Ambulatory Visit: Payer: Self-pay | Admitting: *Deleted

## 2020-10-04 ENCOUNTER — Other Ambulatory Visit: Payer: Self-pay

## 2020-10-04 ENCOUNTER — Ambulatory Visit (INDEPENDENT_AMBULATORY_CARE_PROVIDER_SITE_OTHER): Payer: Self-pay | Admitting: Thoracic Surgery (Cardiothoracic Vascular Surgery)

## 2020-10-04 VITALS — BP 119/78 | HR 57 | Temp 97.6°F | Resp 20 | Ht 64.0 in | Wt 136.0 lb

## 2020-10-04 DIAGNOSIS — C3491 Malignant neoplasm of unspecified part of right bronchus or lung: Secondary | ICD-10-CM

## 2020-10-04 DIAGNOSIS — J90 Pleural effusion, not elsewhere classified: Secondary | ICD-10-CM

## 2020-10-04 DIAGNOSIS — Z902 Acquired absence of lung [part of]: Secondary | ICD-10-CM

## 2020-10-04 NOTE — Progress Notes (Signed)
      [address]       [place] [address]             [phone]        Tonya [name] [facility] Medical Record #[id number] Date of Birth: 1946/05/08  Referring: [name], DO Primary Care: Lin Landsman, MD Primary Cardiologist:No primary care provider on file.  Reason for visit:   follow-up  History of Present Illness:     Mrs. Tonya [name] presents for her 1 month follow-up.  She complains of band-like discomfort along her right costal margin.  She denies any shortness of breath, but states that she has been unable to get past 1244mls on her spirometer.  Physical Exam: BP 119/78   Pulse (!) 57   Temp 97.6 F (36.4 C) (Skin)   Resp 20   Ht 5\' 4"  (1.626 m)   Wt 136 lb (61.7 kg)   SpO2 97% Comment: RA  BMI 23.34 kg/m   Alert NAD Incision clean.   Abdomen soft, ND trace peripheral edema   Diagnostic Studies & Laboratory data: CXR: Left effusion     Assessment / Plan:   75 year old female with a 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  She additionally has a left upper lobe biopsy proven adenocarcinoma which is likely a synchronous primary.  She is awaiting tumor marker results to see if she is a candidate for targeted therapy.    In regards to her effusion, I have orders a US guided thoracentesis I will see her back in 1 months with a CXR    Tonya [name] 10/04/2020 12:32 PM

## 2020-10-07 ENCOUNTER — Ambulatory Visit (HOSPITAL_COMMUNITY)
Admission: RE | Admit: 2020-10-07 | Discharge: 2020-10-07 | Disposition: A | Discharge status: Home / Self Care | Payer: Medicare HMO | Source: Ambulatory Visit | Attending: Thoracic Surgery (Cardiothoracic Vascular Surgery) | Admitting: Thoracic Surgery (Cardiothoracic Vascular Surgery)

## 2020-10-07 ENCOUNTER — Telehealth: Payer: Self-pay | Admitting: Emergency Medicine

## 2020-10-07 ENCOUNTER — Encounter (HOSPITAL_COMMUNITY): Payer: Self-pay | Admitting: Internal Medicine

## 2020-10-07 DIAGNOSIS — Z01812 Encounter for preprocedural laboratory examination: Secondary | ICD-10-CM | POA: Insufficient documentation

## 2020-10-07 DIAGNOSIS — Z20822 Contact with and (suspected) exposure to covid-19: Secondary | ICD-10-CM | POA: Insufficient documentation

## 2020-10-07 LAB — SARS CORONAVIRUS 2 (TAT 6-24 HRS): SARS Coronavirus 2: NEGATIVE

## 2020-10-07 NOTE — Telephone Encounter (Signed)
[facility] [facility] - Customer service manager for the Discovery and Validation of Biomarkers for the Prediction, Diagnosis, and Management of Disease  10/07/20   Outgoing Call: Called to confirm research appointment for this Thursday 10/10/2020 at 8:45am.  Patient stated she was still interested in participating in the study.  She denied any questions at this time.   Clabe Seal Clinical Research Coordinator I  10/07/20  10:48 AM

## 2020-10-09 ENCOUNTER — Other Ambulatory Visit: Payer: Self-pay | Admitting: Physician Assistant

## 2020-10-09 DIAGNOSIS — C3491 Malignant neoplasm of unspecified part of right bronchus or lung: Secondary | ICD-10-CM

## 2020-10-10 ENCOUNTER — Encounter: Payer: Self-pay | Admitting: Internal Medicine

## 2020-10-10 ENCOUNTER — Other Ambulatory Visit (HOSPITAL_COMMUNITY): Payer: Self-pay | Admitting: Radiology

## 2020-10-10 ENCOUNTER — Inpatient Hospital Stay: Payer: Medicare HMO | Attending: Internal Medicine

## 2020-10-10 ENCOUNTER — Inpatient Hospital Stay: Payer: Medicare HMO | Admitting: Internal Medicine

## 2020-10-10 ENCOUNTER — Other Ambulatory Visit: Payer: Self-pay

## 2020-10-10 ENCOUNTER — Other Ambulatory Visit: Payer: Self-pay | Admitting: Radiology

## 2020-10-10 ENCOUNTER — Encounter: Payer: Self-pay | Admitting: *Deleted

## 2020-10-10 ENCOUNTER — Ambulatory Visit (HOSPITAL_COMMUNITY)
Admission: RE | Admit: 2020-10-10 | Discharge: 2020-10-10 | Disposition: A | Discharge status: Home / Self Care | Payer: Medicare HMO | Source: Ambulatory Visit | Attending: Thoracic Surgery (Cardiothoracic Vascular Surgery) | Admitting: Thoracic Surgery (Cardiothoracic Vascular Surgery)

## 2020-10-10 ENCOUNTER — Inpatient Hospital Stay: Payer: Medicare HMO | Admitting: Emergency Medicine

## 2020-10-10 ENCOUNTER — Ambulatory Visit (HOSPITAL_COMMUNITY)
Admission: RE | Admit: 2020-10-10 | Discharge: 2020-10-10 | Disposition: A | Discharge status: Home / Self Care | Payer: Medicare HMO | Source: Ambulatory Visit | Attending: Radiology | Admitting: Radiology

## 2020-10-10 VITALS — BP 129/71 | HR 52 | Temp 97.7°F | Resp 18 | Ht 64.0 in | Wt 139.2 lb

## 2020-10-10 DIAGNOSIS — Z5111 Encounter for antineoplastic chemotherapy: Secondary | ICD-10-CM | POA: Insufficient documentation

## 2020-10-10 DIAGNOSIS — K769 Liver disease, unspecified: Secondary | ICD-10-CM | POA: Diagnosis not present

## 2020-10-10 DIAGNOSIS — R3129 Other microscopic hematuria: Secondary | ICD-10-CM | POA: Insufficient documentation

## 2020-10-10 DIAGNOSIS — N200 Calculus of kidney: Secondary | ICD-10-CM | POA: Insufficient documentation

## 2020-10-10 DIAGNOSIS — C3411 Malignant neoplasm of upper lobe, right bronchus or lung: Secondary | ICD-10-CM | POA: Diagnosis present

## 2020-10-10 DIAGNOSIS — K635 Polyp of colon: Secondary | ICD-10-CM | POA: Diagnosis not present

## 2020-10-10 DIAGNOSIS — K219 Gastro-esophageal reflux disease without esophagitis: Secondary | ICD-10-CM | POA: Insufficient documentation

## 2020-10-10 DIAGNOSIS — J90 Pleural effusion, not elsewhere classified: Secondary | ICD-10-CM | POA: Insufficient documentation

## 2020-10-10 DIAGNOSIS — R110 Nausea: Secondary | ICD-10-CM | POA: Insufficient documentation

## 2020-10-10 DIAGNOSIS — C3491 Malignant neoplasm of unspecified part of right bronchus or lung: Secondary | ICD-10-CM

## 2020-10-10 HISTORY — PX: IR THORACENTESIS RIGHT ASP PLEURAL SPACE W/IMG GUIDE: IMG5380

## 2020-10-10 LAB — CBC WITH DIFFERENTIAL (CANCER CENTER ONLY)
Abs Immature Granulocytes: 0.01 10*3/uL (ref 0.00–0.07)
Basophils Absolute: 0 10*3/uL (ref 0.0–0.1)
Basophils Relative: 1 %
Eosinophils Absolute: 0.3 10*3/uL (ref 0.0–0.5)
Eosinophils Relative: 7 %
HCT: 36.8 % (ref 36.0–46.0)
Hemoglobin: 11.6 g/dL — ABNORMAL LOW (ref 12.0–15.0)
Immature Granulocytes: 0 %
Lymphocytes Relative: 34 %
Lymphs Abs: 1.7 10*3/uL (ref 0.7–4.0)
MCH: 31.5 pg (ref 26.0–34.0)
MCHC: 31.5 g/dL (ref 30.0–36.0)
MCV: 100 fL (ref 80.0–100.0)
Monocytes Absolute: 0.5 10*3/uL (ref 0.1–1.0)
Monocytes Relative: 11 %
Neutro Abs: 2.4 10*3/uL (ref 1.7–7.7)
Neutrophils Relative %: 47 %
Platelet Count: 243 10*3/uL (ref 150–400)
RBC: 3.68 MIL/uL — ABNORMAL LOW (ref 3.87–5.11)
RDW: 11.7 % (ref 11.5–15.5)
WBC Count: 5.1 10*3/uL (ref 4.0–10.5)
nRBC: 0 % (ref 0.0–0.2)

## 2020-10-10 LAB — CMP (CANCER CENTER ONLY)
ALT: 22 U/L (ref 0–44)
AST: 22 U/L (ref 15–41)
Albumin: 3.5 g/dL (ref 3.5–5.0)
Alkaline Phosphatase: 91 U/L (ref 38–126)
Anion gap: 7 (ref 5–15)
BUN: 7 mg/dL — ABNORMAL LOW (ref 8–23)
CO2: 30 mmol/L (ref 22–32)
Calcium: 9.3 mg/dL (ref 8.9–10.3)
Chloride: 106 mmol/L (ref 98–111)
Creatinine: 0.76 mg/dL (ref 0.44–1.00)
GFR, Estimated: >60 mL/min (ref >60)
Glucose, Bld: 76 mg/dL (ref 70–99)
Potassium: 4.3 mmol/L (ref 3.5–5.1)
Sodium: 143 mmol/L (ref 135–145)
Total Bilirubin: 0.3 mg/dL (ref 0.3–1.2)
Total Protein: 6.8 g/dL (ref 6.5–8.1)

## 2020-10-10 LAB — RESEARCH LABS

## 2020-10-10 MED ORDER — LIDOCAINE HCL 1 % IJ SOLN
INTRAMUSCULAR | Status: DC | PRN
  Administered 2020-10-10: 10 mL

## 2020-10-10 MED ORDER — CYANOCOBALAMIN 1000 MCG/ML IJ SOLN
1000.0000 ug | Freq: Once | INTRAMUSCULAR | Status: AC
  Administered 2020-10-10: 1000 ug via INTRAMUSCULAR

## 2020-10-10 MED ORDER — LIDOCAINE HCL 1 % IJ SOLN
INTRAMUSCULAR | Status: AC
  Filled 2020-10-10: qty 20

## 2020-10-10 MED ORDER — PROCHLORPERAZINE MALEATE 10 MG PO TABS: 10.0000 mg | ORAL_TABLET | Freq: Four times a day (QID) | ORAL | 0 refills | Status: DC | PRN

## 2020-10-10 MED ORDER — DEXAMETHASONE 4 MG PO TABS: ORAL_TABLET | ORAL | 0 refills | Status: DC

## 2020-10-10 MED ORDER — FOLIC ACID 1 MG PO TABS: 1.0000 mg | ORAL_TABLET | Freq: Every day | ORAL | 4 refills | Status: DC

## 2020-10-10 MED ORDER — CYANOCOBALAMIN 1000 MCG/ML IJ SOLN
INTRAMUSCULAR | Status: AC
  Filled 2020-10-10: qty 1

## 2020-10-10 NOTE — Research (Signed)
[facility] - Customer service manager for the Discovery and Validation of Biomarkers for the Prediction, Diagnosis, and Management of Disease  10/10/20   Eligibility: Eligibility criteria for this study were reviewed by this research coordinator and the patient is eligible for this study.  Second eligibility review was performed by Hendricks Limes, Clinical Research Nurse.  Consent: Patient Tonya [name] was identified by Dr. Julien Nordmann as a potential candidate for the above listed study.  This Clinical Research Coordinator met with Tonya [name], [id number] on 10/10/20 in a manner and location that ensures patient privacy to discuss participation in the above listed research study.  Patient is Accompanied by her husband.  Patient was previously provided with informed consent documents.  Patient confirmed they have read the informed consent documents.  As outlined in the informed consent form, this Coordinator and Tonya [name] discussed the purpose of the research study, the investigational nature of the study, study procedures and requirements for study participation, potential risks and benefits of study participation, as well as alternatives to participation.  The patient understands participation is voluntary and they may withdraw from study participation at any time.  This study does not involve randomization.  This study does not involve an investigational drug or device. This study does not involve a placebo. Patient understands enrollment is pending full eligibility review.   Confidentiality and how the patient's information will be used as part of study participation were discussed.  Patient was informed there is reimbursement provided for their time and effort spent on trial participation.  The patient is encouraged to discuss research study participation with their insurance provider to determine what costs they may incur as part of study  participation, including research related injury.    All questions were answered to patient's satisfaction.  The informed consent and separate HIPAA Authorization was reviewed page by page.  The patient's mental and emotional status is appropriate to provide informed consent, and the patient verbalizes an understanding of study participation.  Patient has agreed to participate in the above listed research study and has voluntarily signed the informed consent version 2 and separate HIPAA Authorization, version 5 on 10/10/20 at 0929 AM.  The patient was provided with a copy of the signed informed consent form and separate HIPAA Authorization for their reference.  No study specific procedures were obtained prior to the signing of the informed consent document.  Approximately 20 minutes were spent with the patient reviewing the informed consent documents.  Labs: Research labs were drawn at 0950 along with her routine lab appointment.    Gift Card: A $50 gift card was given to patient after research labs were collected.  She was thanked for her time and participation and urged to call with any questions.   Clabe Seal Clinical Research Coordinator I  10/10/20 10:01 AM

## 2020-10-10 NOTE — Progress Notes (Signed)
  Oncology Nurse Navigator Documentation  Oncology Nurse Navigator Flowsheets 10/10/2020  Abnormal Finding Date 07/03/2020  Confirmed Diagnosis Date 09/17/2020  Diagnosis Status Confirmed Diagnosis Complete  Planned Course of Treatment Chemotherapy  Phase of Treatment Chemo  Surgery Actual Start Date: 08/26/2020  Navigator Follow Up Date: 10/10/2020  Navigator Follow Up Reason: Follow-up Appointment  Navigator Location [facility]  Navigator Encounter Type Clinic/MDC  Treatment Initiated Date 08/26/2020  Patient Visit Type MedOnc  Treatment Phase Pre-Tx/Tx Discussion  Barriers/Navigation Needs Education  Education Newly Diagnosed Cancer Education;Other  Interventions Education;Psycho-Social Support  Acuity Level 2-Minimal Needs (1-2 Barriers Identified)  Education Method Verbal;Written  Time Spent with Patient 30

## 2020-10-10 NOTE — Procedures (Signed)
Ultrasound-guided diagnostic and therapeutic right thoracentesis performed yielding  550 cc of hazy, amber fluid. No immediate complications. Follow-up chest x-ray pending. A portion of the fluid was sent to the lab for cytology. EBL < 2 cc.

## 2020-10-10 NOTE — Progress Notes (Signed)
[facility] [phone]) [phone]   Fax:(336) [place], [place] [address]  DIAGNOSIS: Stage IIB (T3, N0, M0) non-small cell lung cancer, adenocarcinoma involving the right upper lobe. The patient also has evidence for adenocarcinoma of the left upper lobe lung nodule as well as subcentimeter pulmonary nodules in the right lung concerning for stage IV lung cancer with multifocal disease bilaterally.  Biomarker Findings  Microsatellite status - MS-Equivocal ? Tumor Mutational Burden - Cannot Be Determined Genomic Findings For a complete list of the genes assayed, please refer to the Appendix. MET [id number]*2 AXL T343M TP53 I254V 7 Disease relevant genes with no reportable alterations: ALK, BRAF, EGFR, ERBB2, KRAS, RET, ROS1  PDL1 Expression 20 %.  PRIOR THERAPY: status post right upper lobectomy with lymph node sampling on August 26, 2020 under the care of Dr. LKipp Brood  CURRENT THERAPY: Adjuvant systemic chemotherapy with cisplatin 75 mg/M2 and Alimta 550/M2 every 3 weeks. First dose October 22, 2020.  INTERVAL HISTORY: Tonya PASSARELLI89y.o. female returns to the clinic today for follow-up visit accompanied by her husband. The patient is feeling fine today with no concerning complaints. She denied having any current chest pain except for a bandlike discomfort in the right lower rib cage and she was found recently by Dr. LKipp Broodto have right pleural effusion and expected to have ultrasound-guided thoracentesis later today. She denied having any shortness of breath, cough or hemoptysis. She denied having any fever or chills. She has no nausea, vomiting, diarrhea or constipation. She denied having any headache or visual changes. She had molecular studies by [facility] 1 that showed no actionable mutations and PD-L1 expression was 20%. The patient is here today for evaluation and discussion of  her treatment options based on the new findings.   MEDICAL HISTORY: Past Medical History:  Diagnosis Date  . Adenoma of left adrenal gland   . [place]  . Colon polyps    hyperplastic  . Complication of anesthesia    Very emotional and cries after anesthesia  . GERD (gastroesophageal reflux disease)   . Kidney stone   . Liver lesion   . Microhematuria     ALLERGIES:  is allergic to penicillins.  MEDICATIONS:  Current Outpatient Medications  Medication Sig Dispense Refill  . acetaminophen (TYLENOL) 500 MG tablet Take 2 tablets (1,000 mg total) by mouth every 6 (six) hours as needed. 30 tablet 0  . albuterol (VENTOLIN HFA) 108 (90 Base) MCG/ACT inhaler Inhale 1-2 puffs into the lungs every 4 (four) hours as needed for wheezing or shortness of breath.  (Patient not taking: Reported on 10/04/2020)    . alendronate (FOSAMAX) 70 MG tablet Take 70 mg by mouth every Saturday.    . ALPRAZolam (XANAX) 1 MG tablet Take 1 mg by mouth 3 (three) times daily as needed for anxiety or sleep.    . Calcium Carbonate-Vitamin D (CALCIUM + D PO) Take 600 mg by mouth 2 (two) times daily.    . cyclobenzaprine (FLEXERIL) 5 MG tablet Take 5 mg by mouth 3 (three) times daily as needed for muscle spasms (sciatic nerve).     . gabapentin (NEURONTIN) 100 MG capsule Take 100 mg by mouth at bedtime.    .Marland KitchenguaiFENesin (MUCINEX) 600 MG 12 hr tablet Take 2 tablets (1,200 mg total) by mouth 2 (two) times daily as needed.    .Marland Kitchenibuprofen (ADVIL)  800 MG tablet Take 1 tablet (800 mg total) by mouth every 8 (eight) hours as needed for moderate pain. 30 tablet 0  . lidocaine (LIDODERM) 5 % Place 1 patch onto the skin daily as needed. Apply patch to area most significant pain once per day.  Remove and discard patch within 12 hours of application. (Patient taking differently: Place 1 patch onto the skin daily as needed (Pain). Apply patch to area most significant pain once per day.  Remove and discard patch within 12  hours of application.) 30 patch 0  . Multiple Vitamin (MULTIVITAMIN WITH MINERALS) TABS Take 1 tablet by mouth daily.    . naproxen (NAPROSYN) 375 MG tablet Take 1 tablet (375 mg total) by mouth 2 (two) times daily as needed for moderate pain. 8 tablet 0  . omeprazole (PRILOSEC) 40 MG capsule Take 40 mg by mouth 2 (two) times daily.    Marland Kitchen oxyCODONE-acetaminophen (PERCOCET) 10-325 MG tablet Take 1 tablet by mouth every 4 (four) hours as needed for moderate pain.     . rosuvastatin (CRESTOR) 20 MG tablet Take 1 tablet (20 mg total) by mouth daily. (Patient taking differently: Take 10 mg by mouth at bedtime.) 30 tablet 0  . Tiotropium Bromide-Olodaterol (STIOLTO RESPIMAT) 2.5-2.5 MCG/ACT AERS Inhale 2 puffs into the lungs daily. (Patient not taking: Reported on 10/04/2020) 4 g 0   No current facility-administered medications for this visit.    SURGICAL HISTORY:  Past Surgical History:  Procedure Laterality Date  . ABDOMINAL HYSTERECTOMY    . APPENDECTOMY    . BRONCHIAL BIOPSY  07/18/2020   Procedure: BRONCHIAL BIOPSIES;  Surgeon: [name], DO;  Location: [facility];  Service: Pulmonary;;  . BRONCHIAL BRUSHINGS  07/18/2020   Procedure: BRONCHIAL BRUSHINGS;  Surgeon: [name], DO;  Location: [facility];  Service: Pulmonary;;  . BRONCHIAL NEEDLE ASPIRATION BIOPSY  07/18/2020   Procedure: BRONCHIAL NEEDLE ASPIRATION BIOPSIES;  Surgeon: [name], DO;  Location: [facility];  Service: Pulmonary;;  . BRONCHIAL WASHINGS  07/18/2020   Procedure: BRONCHIAL WASHINGS;  Surgeon: [name], DO;  Location: [facility];  Service: Pulmonary;;  . FIDUCIAL MARKER PLACEMENT  07/18/2020   Procedure: FIDUCIAL MARKER PLACEMENT;  Surgeon: [name], DO;  Location: [facility];  Service: Pulmonary;;  . INTERCOSTAL NERVE BLOCK Right 08/26/2020   Procedure: INTERCOSTAL NERVE BLOCK;  Surgeon: [name], MD;  Location: [facility];  Service: Thoracic;  Laterality: Right;   . NODE DISSECTION Right 08/26/2020   Procedure: NODE DISSECTION;  Surgeon: [name], MD;  Location: [facility];  Service: Thoracic;  Laterality: Right;  Marland Kitchen VIDEO BRONCHOSCOPY WITH ENDOBRONCHIAL NAVIGATION N/A 07/18/2020   Procedure: VIDEO BRONCHOSCOPY WITH ENDOBRONCHIAL NAVIGATION;  Surgeon: [name], DO;  Location: [facility];  Service: Pulmonary;  Laterality: N/A;  . VIDEO BRONCHOSCOPY WITH ENDOBRONCHIAL ULTRASOUND  07/18/2020   Procedure: VIDEO BRONCHOSCOPY WITH ENDOBRONCHIAL ULTRASOUND;  Surgeon: [name], DO;  Location: [facility];  Service: Pulmonary;;    REVIEW OF SYSTEMS:  Constitutional: positive for fatigue Eyes: negative Ears, nose, mouth, throat, and face: negative Respiratory: positive for pleurisy/chest pain Cardiovascular: negative Gastrointestinal: negative Genitourinary:negative Integument/breast: negative Hematologic/lymphatic: negative Musculoskeletal:negative Neurological: negative Behavioral/Psych: negative Endocrine: negative Allergic/Immunologic: negative   PHYSICAL EXAMINATION: General appearance: alert, cooperative and no distress Head: Normocephalic, without obvious abnormality, atraumatic Neck: no adenopathy, no JVD, supple, symmetrical, trachea midline and thyroid not enlarged, symmetric, no tenderness/mass/nodules Lymph nodes: Cervical, supraclavicular, and axillary nodes normal. Resp: diminished breath  sounds RLL and dullness to percussion RLL Back: symmetric, no curvature. ROM normal. No CVA tenderness. Cardio: regular rate and rhythm, S1, S2 normal, no murmur, click, rub or gallop GI: soft, non-tender; bowel sounds normal; no masses,  no organomegaly Extremities: extremities normal, atraumatic, no cyanosis or edema Neurologic: Alert and oriented X 3, normal strength and tone. Normal symmetric reflexes. Normal coordination and gait  ECOG PERFORMANCE STATUS: 1 - Symptomatic but completely ambulatory  Blood pressure 129/71,  pulse (!) 52, temperature 97.7 F (36.5 C), temperature source Tympanic, resp. rate 18, height 5' 4" (1.626 m), weight 139 lb 3.2 oz (63.1 kg), SpO2 100 %.  LABORATORY DATA: Lab Results  Component Value Date   WBC 6.6 09/11/2020   HGB 10.8 (L) 09/11/2020   HCT 33.8 (L) 09/11/2020   MCV 100.6 (H) 09/11/2020   PLT 339 09/11/2020      Chemistry      Component Value Date/Time   NA 142 09/11/2020 1401   K 4.1 09/11/2020 1401   CL 108 09/11/2020 1401   CO2 25 09/11/2020 1401   BUN 8 09/11/2020 1401   CREATININE 0.73 09/11/2020 1401      Component Value Date/Time   CALCIUM 9.5 09/11/2020 1401   ALKPHOS 86 09/11/2020 1401   AST 19 09/11/2020 1401   ALT 11 09/11/2020 1401   BILITOT 0.4 09/11/2020 1401       RADIOGRAPHIC STUDIES: DG Chest 2 View  Result Date: 10/04/2020 CLINICAL DATA:  Lung cancer, prior RIGHT partial lung resection in December, RIGHT upper lobectomy. EXAM: CHEST - 2 VIEW COMPARISON:  August 29, 2020 FINDINGS: Mediastinal contours are stable with changes of RIGHT upper lobectomy as on the prior study. Since the previous exam there is some obscuration of the RIGHT hemidiaphragm. No visible pneumothorax. Fiducial markers in the LEFT lung apex. Blunting of the "RIGHT costodiaphragmatic sulcus". No lobar consolidative changes. On limited assessment no acute skeletal process. IMPRESSION: 1. Interval development of small to moderate RIGHT-sided pleural effusion with elevation of the RIGHT hemidiaphragm which is otherwise similar following RIGHT upper lobectomy. 2. Fiducial markers in the area of known LEFT upper lobe lesion. Electronically Signed   By: Zetta Bills M.D.   On: 10/04/2020 09:00    ASSESSMENT AND PLAN: This is a very pleasant 75 years old African-American female recently diagnosed with a stage IIb (T3, N0, M0) non-small cell lung cancer, adenocarcinoma involving the right upper lobe with 2 nodules. The patient also has evidence for adenocarcinoma of the left  upper lobe and subcentimeter nodules in the right lung concerning for stage IV with multifocal disease bilaterally. She is status post right upper lobectomy with lymph node dissection on August 26, 2020 under the care of Dr. Kipp Brood. Her molecular studies by [facility] showed no actionable mutations. She has PD-L1 expression of 20%. I had a lengthy discussion with the patient today about her current condition and treatment options. The patient is not a candidate for target adjuvant therapy with Tagrisso in the absence of EGFR mutation. I discussed with her the option of adjuvant systemic chemotherapy and she is interested in this treatment. She will be treated with cisplatin 75 mg/M2 1 Alimta 500 mg/M2 every 3 weeks. I discussed with the patient the adverse effect of this treatment including but not limited to alopecia, myelosuppression, nausea or vomiting, diarrhea or constipation. There is also risk for peripheral neuropathy as well as hearing deficit. The patient is expected to start the first cycle of this treatment on  October 22, 2020. She may also benefit from treatment with adjuvant immunotherapy in the future with the current PD-L1 expression of 20%. For the left upper lobe nodule, I will refer the patient to radiation oncology for consideration of SBRT to this lesion. The patient will receive vitamin B12 injection today. I will call her pharmacy with prescription for Compazine 10 mg p.o. every 6 hours as needed for nausea, folic acid 1 mg p.o. daily and Decadron 4 mg p.o. twice daily the day before, day of and day after the chemotherapy every 3 weeks. She will come back for follow-up visit 1 week after the start of her treatment for evaluation and management of any adverse effect of her treatment. The patient was advised to call immediately if she has any other concerning symptoms in the interval. The patient voices understanding of current disease status and treatment options and is in  agreement with the current care plan.  All questions were answered. The patient knows to call the clinic with any problems, questions or concerns. We can certainly see the patient much sooner if necessary.  The total time spent in the appointment was 50 minutes.  Disclaimer: This note was dictated with voice recognition software. Similar sounding words can inadvertently be transcribed and may not be corrected upon review.

## 2020-10-10 NOTE — Progress Notes (Signed)
START ON PATHWAY REGIMEN - Non-Small Cell Lung     A cycle is every 21 days:     Pemetrexed      Cisplatin   **Always confirm dose/schedule in your pharmacy ordering system**  Patient Characteristics: Postoperative without Neoadjuvant Therapy (Pathologic Staging), Stage IIB, Adjuvant Chemotherapy, Nonsquamous Cell Therapeutic Status: Postoperative without Neoadjuvant Therapy (Pathologic Staging) AJCC T Category: pT3 AJCC N Category: pN0 AJCC M Category: cM0 AJCC 8 Stage Grouping: IIB Histology: Nonsquamous Cell Intent of Therapy: Curative Intent, Discussed with Patient

## 2020-10-14 ENCOUNTER — Telehealth: Payer: Self-pay | Admitting: Internal Medicine

## 2020-10-14 ENCOUNTER — Telehealth: Payer: Self-pay | Admitting: Medical Oncology

## 2020-10-14 LAB — CYTOLOGY - NON PAP

## 2020-10-14 NOTE — Telephone Encounter (Signed)
Scheduled appointments per 2/3 los. Spoke to patient who is aware of appointments dates and times. Scheduled first tx cycle a week out per patient request, okay to schedule this way per MD.

## 2020-10-14 NOTE — Telephone Encounter (Signed)
Scheduling appt-transferred to [facility].

## 2020-10-15 ENCOUNTER — Other Ambulatory Visit: Payer: Self-pay

## 2020-10-15 ENCOUNTER — Inpatient Hospital Stay: Payer: Medicare HMO

## 2020-10-17 ENCOUNTER — Other Ambulatory Visit (HOSPITAL_COMMUNITY)
Admission: RE | Admit: 2020-10-17 | Discharge: 2020-10-17 | Disposition: A | Discharge status: Home / Self Care | Payer: Medicare HMO | Source: Ambulatory Visit | Attending: Pulmonary Disease | Admitting: Pulmonary Disease

## 2020-10-17 ENCOUNTER — Ambulatory Visit (INDEPENDENT_AMBULATORY_CARE_PROVIDER_SITE_OTHER): Payer: Medicare HMO

## 2020-10-17 ENCOUNTER — Other Ambulatory Visit: Payer: Self-pay

## 2020-10-17 ENCOUNTER — Ambulatory Visit: Payer: Medicare HMO | Admitting: Pulmonary Disease

## 2020-10-17 ENCOUNTER — Telehealth: Payer: Self-pay | Admitting: Pulmonary Disease

## 2020-10-17 ENCOUNTER — Encounter: Payer: Self-pay | Admitting: Pulmonary Disease

## 2020-10-17 VITALS — BP 120/62 | HR 53 | Temp 97.4°F | Ht 64.0 in | Wt 139.1 lb

## 2020-10-17 DIAGNOSIS — C3491 Malignant neoplasm of unspecified part of right bronchus or lung: Secondary | ICD-10-CM

## 2020-10-17 DIAGNOSIS — J90 Pleural effusion, not elsewhere classified: Secondary | ICD-10-CM

## 2020-10-17 DIAGNOSIS — Z87891 Personal history of nicotine dependence: Secondary | ICD-10-CM | POA: Diagnosis not present

## 2020-10-17 DIAGNOSIS — J432 Centrilobular emphysema: Secondary | ICD-10-CM | POA: Diagnosis not present

## 2020-10-17 DIAGNOSIS — C3492 Malignant neoplasm of unspecified part of left bronchus or lung: Secondary | ICD-10-CM

## 2020-10-17 DIAGNOSIS — Z5111 Encounter for antineoplastic chemotherapy: Secondary | ICD-10-CM | POA: Diagnosis not present

## 2020-10-17 LAB — BODY FLUID CELL COUNT WITH DIFFERENTIAL
Eos, Fluid: 2 %
Lymphs, Fluid: 65 %
Monocyte-Macrophage-Serous Fluid: 29 % — ABNORMAL LOW (ref 50–90)
Neutrophil Count, Fluid: 1 % (ref 0–25)
Other Cells, Fluid: 3 %
Total Nucleated Cell Count, Fluid: 2370 cu mm — ABNORMAL HIGH (ref 0–1000)

## 2020-10-17 LAB — GLUCOSE, PLEURAL OR PERITONEAL FLUID: Glucose, Fluid: 93 mg/dL

## 2020-10-17 LAB — PROTEIN, PLEURAL OR PERITONEAL FLUID: Total protein, fluid: 3.9 g/dL

## 2020-10-17 LAB — ALBUMIN, PLEURAL OR PERITONEAL FLUID: Albumin, Fluid: 2.4 g/dL

## 2020-10-17 LAB — LACTATE DEHYDROGENASE, PLEURAL OR PERITONEAL FLUID: LD, Fluid: 115 U/L — ABNORMAL HIGH (ref 3–23)

## 2020-10-17 NOTE — Progress Notes (Signed)
Thoracentesis  Procedure Note  Tonya [name]  [id number]  [birth year]  Date:10/17/20  Time:10:46 AM   Provider Performing:Zalman Hull L [name]   Procedure: Thoracentesis with imaging guidance (15056)  Indication(s) Pleural Effusion  Consent Risks of the procedure as well as the alternatives and risks of each were explained to the patient and/or caregiver.  Consent for the procedure was obtained and is signed in the bedside chart  Anesthesia Topical only with 1% lidocaine    Time Out Verified patient identification, verified procedure, site/side was marked, verified correct patient position, special equipment/implants available, medications/allergies/relevant history reviewed, required imaging and test results available.   Sterile Technique Maximal sterile technique including full sterile barrier drape, hand hygiene, sterile gown, sterile gloves, mask, hair covering, sterile ultrasound probe cover (if used).  Procedure Description Ultrasound was used to identify appropriate pleural anatomy for placement and overlying skin marked. Please see documented image below of right pleural space under ultrasound. Area of drainage cleaned and draped in sterile fashion. Lidocaine was used to anesthetize the skin and subcutaneous tissue.  600 cc's of amber appearing fluid was drained from the right pleural space. Catheter then removed and bandaid applied to site.   Complications/Tolerance None; patient tolerated the procedure well. Chest X-ray is ordered to confirm no post-procedural complication.   EBL <1cc   Specimen(s) Pleural fluid        [name], DO [facility] 10/17/2020 10:47 AM

## 2020-10-17 NOTE — Addendum Note (Signed)
Addended by: Amado Coe on: 10/17/2020 02:09 PM   Modules accepted: Orders

## 2020-10-17 NOTE — Patient Instructions (Signed)
Thank you for visiting Dr. Valeta Harms at [facility]. Today we recommend the following:  Orders Placed This Encounter  Procedures  . DG Chest 2 View   Return in about 8 weeks (around 12/12/2020) for with APP or Dr. Valeta Harms.    Please do your part to reduce the spread of COVID-19.

## 2020-10-17 NOTE — Addendum Note (Signed)
Addended by: Elie Confer on: 10/17/2020 12:09 PM   Modules accepted: Orders

## 2020-10-17 NOTE — Telephone Encounter (Signed)
Called spoke with [facility],  Resulting agency needed to be [facility].  Orders changed And released so lab could run results Nothing further needed at this time.

## 2020-10-17 NOTE — Progress Notes (Signed)
Synopsis: Referred in Oct 2021 for lung nodule by Lin Landsman, MD  Subjective:   PATIENT ID: Tonya [name] GENDER: female DOB: 26-Jul-1946, MRN: [id number]  Chief Complaint  Patient presents with  . Follow-up    Pt stated that she has her good and bad days.  Has the thoracentesis done on 2/3    This is a 75 year old female, former smoker, quit last week, smoked for 60+ years at approximately 1/3 to 1/2 pack/day.  She recently went to the emergency department for evaluation for chest pain.  She was referred to cardiology.  She had a coronary calcium CT scan that was completed at [facility] imaging.  Record available for review in epic care everywhere.  This was completed on 06/11/2020.  This revealed a 1.8 cm spiculated nodule in the right upper lobe concerning for primary bronchogenic carcinoma.  I am unable to review the images today but I can review the report.  I discussed this with the patient today in the office in detail.  As for her symptoms her chest pain has since resolved.  She also had an echocardiogram completed recently with grade 1 diastolic dysfunction.  She is following with Dr. Fraser Din from [facility] cardiology.  From a respiratory standpoint she does have some dyspnea on exertion.  She is not had any previous maintenance inhalers for COPD.  She does have evidence emphysema on her coronary CT scan as per her report.  No family history of lung cancer..  OV 07/11/2020: Patient here today for follow-up after recent nuclear medicine pet imaging.  As well as super D CT imaging.  Patient found to have a new left upper lobe lesion as well as a right upper lobe lesion.  The new left upper lobe lesion was found in this process of work-up.  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  We discussed this today in the office.  Patient is very anxious about all of this new information.  Denies fevers night sweats weight loss.  OV 08/22/2020: Patient here today for follow-up after  bronchoscopy and surgery consultation.  Surgery consultation reviewed and discussed with Dr. Kipp Brood.  Actually spoke with Dr. Kipp Brood this morning to discuss case.  Patient has plans for resection of right-sided malignancy.  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  MRI of the brain negative results reviewed today in the office.  Patient has plans for resection of the right side and likely SBRT to the left.  Patient is somewhat anxious for her planned procedure but is ready to get this process moving forward and over with.  OV 10/17/2020: 75 year old here for follow-up.  On 08/26/2020 patient underwent right upper lobe lobectomy.  Pathology with invasive multifocal adenocarcinoma.  There was involvement of the pleura.  Patient has subsequently seen medical oncology.  She also has a lower lobe lung cancer of the left side also adenocarcinoma.  Patient had postoperative chest x-ray which revealed a pleural effusion.  Patient was referred to interventional radiology.  Had thoracentesis on 10/10/2020.  Pathology shows atypical cells there was a few cells that stained with TTF-1.  No overt call on malignancy within the pleural fluid.  Reviewed path results today in the office with patient.  Also on exam had no breath sounds in the right side and ultrasound of the right chest shows reaccumulation of the right-sided pleural effusion.  Patient does have some shortness of breath.  She is anxious about all the things that have been changing around her.  Patient  denies fever chills night sweats weight loss.   Past Medical History:  Diagnosis Date  . Adenoma of left adrenal gland   . Cancer ([place])    Lung  . Colon polyps    hyperplastic  . Complication of anesthesia    Very emotional and cries after anesthesia  . GERD (gastroesophageal reflux disease)   . Kidney stone   . Liver lesion   . Microhematuria      Family History  Problem Relation Age of  Onset  . Arrhythmia Mother        s/p Pacer   . Heart attack Father   . Heart disease Sister   . Colon cancer Neg Hx      Past Surgical History:  Procedure Laterality Date  . ABDOMINAL HYSTERECTOMY    . APPENDECTOMY    . BRONCHIAL BIOPSY  07/18/2020   Procedure: BRONCHIAL BIOPSIES;  Surgeon: [name], DO;  Location: [place] ENDOSCOPY;  Service: Pulmonary;;  . BRONCHIAL BRUSHINGS  07/18/2020   Procedure: BRONCHIAL BRUSHINGS;  Surgeon: [name], DO;  Location: [place] ENDOSCOPY;  Service: Pulmonary;;  . BRONCHIAL NEEDLE ASPIRATION BIOPSY  07/18/2020   Procedure: BRONCHIAL NEEDLE ASPIRATION BIOPSIES;  Surgeon: [name], DO;  Location: [place] ENDOSCOPY;  Service: Pulmonary;;  . BRONCHIAL WASHINGS  07/18/2020   Procedure: BRONCHIAL WASHINGS;  Surgeon: [name], DO;  Location: [place] ENDOSCOPY;  Service: Pulmonary;;  . FIDUCIAL MARKER PLACEMENT  07/18/2020   Procedure: FIDUCIAL MARKER PLACEMENT;  Surgeon: [name], DO;  Location: [place] ENDOSCOPY;  Service: Pulmonary;;  . INTERCOSTAL NERVE BLOCK Right 08/26/2020   Procedure: INTERCOSTAL NERVE BLOCK;  Surgeon: [name], MD;  Location: [place];  Service: Thoracic;  Laterality: Right;  . IR THORACENTESIS ASP PLEURAL SPACE W/IMG GUIDE  10/10/2020  . NODE DISSECTION Right 08/26/2020   Procedure: NODE DISSECTION;  Surgeon: [name], MD;  Location: [place];  Service: Thoracic;  Laterality: Right;  Marland Kitchen VIDEO BRONCHOSCOPY WITH ENDOBRONCHIAL NAVIGATION N/A 07/18/2020   Procedure: VIDEO BRONCHOSCOPY WITH ENDOBRONCHIAL NAVIGATION;  Surgeon: [name], DO;  Location: [place];  Service: Pulmonary;  Laterality: N/A;  . VIDEO BRONCHOSCOPY WITH ENDOBRONCHIAL ULTRASOUND  07/18/2020   Procedure: VIDEO BRONCHOSCOPY WITH ENDOBRONCHIAL ULTRASOUND;  Surgeon: [name], DO;  Location: [place] ENDOSCOPY;  Service: Pulmonary;;    Social History   Socioeconomic History  . Marital status: Married    Spouse name: Not  on file  . Number of children: 3  . Years of education: Not on file  . Highest education level: Not on file  Occupational History  . Not on file  Tobacco Use  . Smoking status: Former Smoker    Packs/day: 0.30    Years: 60.00    Pack years: 18.00    Types: Cigarettes    Quit date: 06/11/2020    Years since quitting: 0.3  . Smokeless tobacco: Never Used  Vaping Use  . Vaping Use: Never used  Substance and Sexual Activity  . Alcohol use: Yes    Comment: 0.5 drink per month  . Drug use: No  . Sexual activity: Not Currently    Birth control/protection: Surgical    Comment: Hysterectomy  Other Topics Concern  . 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    Social Determinants of Health   Financial Resource Strain: Not on file  Food Insecurity: Not on file  Transportation Needs: Not on file  Physical Activity: Not  on file  Stress: Not on file  Social Connections: Not on file  Intimate Partner Violence: Not on file     Allergies  Allergen Reactions  . Penicillins Hives    REACTION: 20 years     Outpatient Medications Prior to Visit  Medication Sig Dispense Refill  . acetaminophen (TYLENOL) 500 MG tablet Take 2 tablets (1,000 mg total) by mouth every 6 (six) hours as needed. 30 tablet 0  . albuterol (VENTOLIN HFA) 108 (90 Base) MCG/ACT inhaler Inhale 1-2 puffs into the lungs every 4 (four) hours as needed for wheezing or shortness of breath.    Marland Kitchen alendronate (FOSAMAX) 70 MG tablet Take 70 mg by mouth every Saturday.    . ALPRAZolam (XANAX) 1 MG tablet Take 1 mg by mouth 3 (three) times daily as needed for anxiety or sleep.    . Calcium Carbonate-Vitamin D (CALCIUM + D PO) Take 600 mg by mouth 2 (two) times daily.    . cyclobenzaprine (FLEXERIL) 5 MG tablet Take 5 mg by mouth 3 (three) times daily as needed for muscle spasms (sciatic nerve).     Marland Kitchen dexamethasone (DECADRON) 4 MG tablet 1 tablet p.o. twice daily the day before, day of and day after the  chemotherapy every 3 weeks. 40 tablet 0  . folic acid (FOLVITE) 1 MG tablet Take 1 tablet (1 mg total) by mouth daily. 30 tablet 4  . gabapentin (NEURONTIN) 100 MG capsule Take 100 mg by mouth at bedtime.    Marland Kitchen guaiFENesin (MUCINEX) 600 MG 12 hr tablet Take 2 tablets (1,200 mg total) by mouth 2 (two) times daily as needed.    Marland Kitchen ibuprofen (ADVIL) 800 MG tablet Take 1 tablet (800 mg total) by mouth every 8 (eight) hours as needed for moderate pain. 30 tablet 0  . lidocaine (LIDODERM) 5 % Place 1 patch onto the skin daily as needed. Apply patch to area most significant pain once per day.  Remove and discard patch within 12 hours of application. (Patient taking differently: Place 1 patch onto the skin daily as needed (Pain). Apply patch to area most significant pain once per day.  Remove and discard patch within 12 hours of application.) 30 patch 0  . Multiple Vitamin (MULTIVITAMIN WITH MINERALS) TABS Take 1 tablet by mouth daily.    . naproxen (NAPROSYN) 375 MG tablet Take 1 tablet (375 mg total) by mouth 2 (two) times daily as needed for moderate pain. 8 tablet 0  . omeprazole (PRILOSEC) 40 MG capsule Take 40 mg by mouth 2 (two) times daily.    Marland Kitchen oxyCODONE-acetaminophen (PERCOCET) 10-325 MG tablet Take 1 tablet by mouth every 4 (four) hours as needed for moderate pain.     Marland Kitchen prochlorperazine (COMPAZINE) 10 MG tablet Take 1 tablet (10 mg total) by mouth every 6 (six) hours as needed for nausea or vomiting. 30 tablet 0  . rosuvastatin (CRESTOR) 20 MG tablet Take 1 tablet (20 mg total) by mouth daily. (Patient taking differently: Take 10 mg by mouth at bedtime.) 30 tablet 0  . Tiotropium Bromide-Olodaterol (STIOLTO RESPIMAT) 2.5-2.5 MCG/ACT AERS Inhale 2 puffs into the lungs daily. 4 g 0   No facility-administered medications prior to visit.    Review of Systems  Constitutional: Positive for malaise/fatigue and weight loss. Negative for chills and fever.  HENT: Negative for hearing loss, sore throat and  tinnitus.   Eyes: Negative for blurred vision and double vision.  Respiratory: Positive for shortness of breath. Negative for cough, hemoptysis,  sputum production, wheezing and stridor.   Cardiovascular: Negative for chest pain, palpitations, orthopnea, leg swelling and PND.  Gastrointestinal: Negative for abdominal pain, constipation, diarrhea, heartburn, nausea and vomiting.  Genitourinary: Negative for dysuria, hematuria and urgency.  Musculoskeletal: Negative for joint pain and myalgias.  Skin: Negative for itching and rash.  Neurological: Negative for dizziness, tingling, weakness and headaches.  Endo/Heme/Allergies: Negative for environmental allergies. Does not bruise/bleed easily.  Psychiatric/Behavioral: Negative for depression. The patient is not nervous/anxious and does not have insomnia.   All other systems reviewed and are negative.    Objective:  Physical Exam Vitals reviewed.  Constitutional:      General: She is not in acute distress.    Appearance: She is well-developed and well-nourished.     Comments: Thin  HENT:     Head: Normocephalic and atraumatic.     Mouth/Throat:     Mouth: Oropharynx is clear and moist.  Eyes:     General: No scleral icterus.    Conjunctiva/sclera: Conjunctivae normal.     Pupils: Pupils are equal, round, and reactive to light.  Neck:     Vascular: No JVD.     Trachea: No tracheal deviation.  Cardiovascular:     Rate and Rhythm: Normal rate and regular rhythm.     Pulses: Intact distal pulses.     Heart sounds: Normal heart sounds. No murmur heard.   Pulmonary:     Effort: Pulmonary effort is normal. No tachypnea, accessory muscle usage or respiratory distress.     Breath sounds: No stridor. No wheezing, rhonchi or rales.     Comments: Diminished breath sounds in the right base. Abdominal:     General: Bowel sounds are normal. There is no distension.     Palpations: Abdomen is soft.     Tenderness: There is no abdominal  tenderness.  Musculoskeletal:        General: No tenderness or edema.     Cervical back: Neck supple.  Lymphadenopathy:     Cervical: No cervical adenopathy.  Skin:    General: Skin is warm and dry.     Capillary Refill: Capillary refill takes less than 2 seconds.     Findings: No rash.  Neurological:     Mental Status: She is alert and oriented to person, place, and time.  Psychiatric:        Mood and Affect: Mood and affect normal.        Behavior: Behavior normal.      Vitals:   10/17/20 0926  BP: 120/62  Pulse: (!) 53  Temp: (!) 97.4 F (36.3 C)  TempSrc: Tympanic  SpO2: 98%  Weight: 139 lb 2 oz (63.1 kg)  Height: 5\' 4"  (1.626 m)   98% on RA BMI Readings from Last 3 Encounters:  10/17/20 23.88 kg/m  10/10/20 23.89 kg/m  10/04/20 23.34 kg/m   Wt Readings from Last 3 Encounters:  10/17/20 139 lb 2 oz (63.1 kg)  10/10/20 139 lb 3.2 oz (63.1 kg)  10/04/20 136 lb (61.7 kg)     CBC    Component Value Date/Time   WBC 5.1 10/10/2020 0941   WBC 10.8 (H) 08/28/2020 0140   RBC 3.68 (L) 10/10/2020 0941   HGB 11.6 (L) 10/10/2020 0941   HCT 36.8 10/10/2020 0941   PLT 243 10/10/2020 0941   MCV 100.0 10/10/2020 0941   MCH 31.5 10/10/2020 0941   MCHC 31.5 10/10/2020 0941   RDW 11.7 10/10/2020 0941   LYMPHSABS 1.7 10/10/2020  0941   MONOABS 0.5 10/10/2020 0941   EOSABS 0.3 10/10/2020 0941   BASOSABS 0.0 10/10/2020 0941     Chest Imaging: 06/11/2020 coronary calcium CT scan images: Report reviewed in [place] A right upper lobe 1.8 cm lung nodule concerning for primary bronchogenic carcinoma. The patient's images have been independently reviewed by me.    Pulmonary Functions Testing Results: PFT Results Latest Ref Rng & Units 06/24/2020  FVC-Pre L 1.59  FVC-Predicted Pre % 69  FVC-Post L 1.89  FVC-Predicted Post % 82  Pre FEV1/FVC % % 67  Post FEV1/FCV % % 68  FEV1-Pre L 1.06  FEV1-Predicted Pre % 59  FEV1-Post L 1.28  DLCO uncorrected  ml/min/mmHg 10.29  DLCO UNC% % 53  DLVA Predicted % 85  TLC L 4.78  TLC % Predicted % 94  RV % Predicted % 142    FeNO:   Pathology:   08/26/2020 RUL Lobectomy:  . LUNG, RIGHT UPPER LOBE, LOBECTOMY:  - Multifocal invasive adenocarcinoma, moderately differentiated, 1.4 cm  - Carcinoma involves the pleura  - Margins uninvolved by adenocarcinoma  - See oncology table and comment below   10/10/2020:  Specimen Submitted: A. PLEURAL FLUID, RIGHT, THORACENTESIS:  FINAL MICROSCOPIC DIAGNOSIS:  - Atypical cells present  SPECIMEN ADEQUACY:  Satisfactory for evaluation  DIAGNOSTIC COMMENTS:  IHC for TTF-1 identifies rare cells morphologically indistinct from cell  block material. D2-40 is positive in mesothelial cells. CD163 is  positive in histiocytes. MOC31 is negative. Should fluid  re-accumulate, cytology may be helpful  Echocardiogram:   Heart Catheterization:     Assessment & Plan:     ICD-10-CM   1. Adenocarcinoma of lung, stage IIb, right (HCC)  C34.91    LUNG, RIGHT UPPER LOBE, LOBECTOMY:  - Multifocal invasive adenocarcinoma, moderately differentiated, 1.4 cm  - Carcinoma involves the pleura  2. Adenocarcinoma of left lung, stage 1 (HCC)  C34.92   3. Former smoker  Z87.891   4. Centrilobular emphysema ([place])  J43.2   5. Pleural effusion on right  J90 DG Chest 2 View    Discussion:  This is a 75 year old female, T2b, N0, M0 adenocarcinoma of the right upper lobe, stage I left lower lobe lung cancer status post right upper lobe lobectomy.  Now with recurrent right-sided pleural effusion, now since is recurred so quickly within a week concern for malignant effusion is possible.  Former smoker 60+ pack year history.  COPD currently on Stiolto  Plan: Discussed pleural fluid analysis from IR guided thoracentesis from last week.  Ultrasound imaging today with recurrent right-sided effusion Discussed risk benefits and alternatives of proceeding with office  thoracentesis today. Patient is agreeable for this due to recurrence of effusion within 1 week. We will plan to send for repeat cytology evaluation. Continue Stiolto Continue as needed albuterol.    Current Outpatient Medications:  .  acetaminophen (TYLENOL) 500 MG tablet, Take 2 tablets (1,000 mg total) by mouth every 6 (six) hours as needed., Disp: 30 tablet, Rfl: 0 .  albuterol (VENTOLIN HFA) 108 (90 Base) MCG/ACT inhaler, Inhale 1-2 puffs into the lungs every 4 (four) hours as needed for wheezing or shortness of breath., Disp: , Rfl:  .  alendronate (FOSAMAX) 70 MG tablet, Take 70 mg by mouth every Saturday., Disp: , Rfl:  .  ALPRAZolam (XANAX) 1 MG tablet, Take 1 mg by mouth 3 (three) times daily as needed for anxiety or sleep., Disp: , Rfl:  .  Calcium Carbonate-Vitamin D (CALCIUM + D  PO), Take 600 mg by mouth 2 (two) times daily., Disp: , Rfl:  .  cyclobenzaprine (FLEXERIL) 5 MG tablet, Take 5 mg by mouth 3 (three) times daily as needed for muscle spasms (sciatic nerve). , Disp: , Rfl:  .  dexamethasone (DECADRON) 4 MG tablet, 1 tablet p.o. twice daily the day before, day of and day after the chemotherapy every 3 weeks., Disp: 40 tablet, Rfl: 0 .  folic acid (FOLVITE) 1 MG tablet, Take 1 tablet (1 mg total) by mouth daily., Disp: 30 tablet, Rfl: 4 .  gabapentin (NEURONTIN) 100 MG capsule, Take 100 mg by mouth at bedtime., Disp: , Rfl:  .  guaiFENesin (MUCINEX) 600 MG 12 hr tablet, Take 2 tablets (1,200 mg total) by mouth 2 (two) times daily as needed., Disp: , Rfl:  .  ibuprofen (ADVIL) 800 MG tablet, Take 1 tablet (800 mg total) by mouth every 8 (eight) hours as needed for moderate pain., Disp: 30 tablet, Rfl: 0 .  lidocaine (LIDODERM) 5 %, Place 1 patch onto the skin daily as needed. Apply patch to area most significant pain once per day.  Remove and discard patch within 12 hours of application. (Patient taking differently: Place 1 patch onto the skin daily as needed (Pain). Apply patch  to area most significant pain once per day.  Remove and discard patch within 12 hours of application.), Disp: 30 patch, Rfl: 0 .  Multiple Vitamin (MULTIVITAMIN WITH MINERALS) TABS, Take 1 tablet by mouth daily., Disp: , Rfl:  .  naproxen (NAPROSYN) 375 MG tablet, Take 1 tablet (375 mg total) by mouth 2 (two) times daily as needed for moderate pain., Disp: 8 tablet, Rfl: 0 .  omeprazole (PRILOSEC) 40 MG capsule, Take 40 mg by mouth 2 (two) times daily., Disp: , Rfl:  .  oxyCODONE-acetaminophen (PERCOCET) 10-325 MG tablet, Take 1 tablet by mouth every 4 (four) hours as needed for moderate pain. , Disp: , Rfl:  .  prochlorperazine (COMPAZINE) 10 MG tablet, Take 1 tablet (10 mg total) by mouth every 6 (six) hours as needed for nausea or vomiting., Disp: 30 tablet, Rfl: 0 .  rosuvastatin (CRESTOR) 20 MG tablet, Take 1 tablet (20 mg total) by mouth daily. (Patient taking differently: Take 10 mg by mouth at bedtime.), Disp: 30 tablet, Rfl: 0 .  Tiotropium Bromide-Olodaterol (STIOLTO RESPIMAT) 2.5-2.5 MCG/ACT AERS, Inhale 2 puffs into the lungs daily., Disp: 4 g, Rfl: 0   I spent 40 minutes dedicated to the care of this patient on the date of this encounter to include pre-visit review of records, face-to-face time with the patient discussing conditions above, post visit ordering of testing, clinical documentation with the electronic health record, making appropriate referrals as documented, and communicating necessary findings to members of the patients care team.    [name], DO [facility] 10/17/2020 9:37 AM

## 2020-10-17 NOTE — H&P (View-Only) (Signed)
Synopsis: Referred in Oct 2021 for lung nodule by Lin Landsman, MD  Subjective:   PATIENT ID: Tonya [name] GENDER: female DOB: 25-Jan-1946, MRN: [id number]  Chief Complaint  Patient presents with  . Follow-up    Pt stated that she has her good and bad days.  Has the thoracentesis done on 2/3    This is a 75 year old female, former smoker, quit last week, smoked for 60+ years at approximately 1/3 to 1/2 pack/day.  She recently went to the emergency department for evaluation for chest pain.  She was referred to cardiology.  She had a coronary calcium CT scan that was completed at [facility] imaging.  Record available for review in epic care everywhere.  This was completed on 06/11/2020.  This revealed a 1.8 cm spiculated nodule in the right upper lobe concerning for primary bronchogenic carcinoma.  I am unable to review the images today but I can review the report.  I discussed this with the patient today in the office in detail.  As for her symptoms her chest pain has since resolved.  She also had an echocardiogram completed recently with grade 1 diastolic dysfunction.  She is following with Dr. Fraser Din from [facility] cardiology.  From a respiratory standpoint she does have some dyspnea on exertion.  She is not had any previous maintenance inhalers for COPD.  She does have evidence emphysema on her coronary CT scan as per her report.  No family history of lung cancer..  OV 07/11/2020: Patient here today for follow-up after recent nuclear medicine pet imaging.  As well as super D CT imaging.  Patient found to have a new left upper lobe lesion as well as a right upper lobe lesion.  The new left upper lobe lesion was found in this process of work-up.  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  We discussed this today in the office.  Patient is very anxious about all of this new information.  Denies fevers night sweats weight loss.  OV 08/22/2020: Patient here today for follow-up after  bronchoscopy and surgery consultation.  Surgery consultation reviewed and discussed with Dr. Kipp Brood.  Actually spoke with Dr. Kipp Brood this morning to discuss case.  Patient has plans for resection of right-sided malignancy.  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  MRI of the brain negative results reviewed today in the office.  Patient has plans for resection of the right side and likely SBRT to the left.  Patient is somewhat anxious for her planned procedure but is ready to get this process moving forward and over with.  OV 10/17/2020: 75 year old here for follow-up.  On 08/26/2020 patient underwent right upper lobe lobectomy.  Pathology with invasive multifocal adenocarcinoma.  There was involvement of the pleura.  Patient has subsequently seen medical oncology.  She also has a lower lobe lung cancer of the left side also adenocarcinoma.  Patient had postoperative chest x-ray which revealed a pleural effusion.  Patient was referred to interventional radiology.  Had thoracentesis on 10/10/2020.  Pathology shows atypical cells there was a few cells that stained with TTF-1.  No overt call on malignancy within the pleural fluid.  Reviewed path results today in the office with patient.  Also on exam had no breath sounds in the right side and ultrasound of the right chest shows reaccumulation of the right-sided pleural effusion.  Patient does have some shortness of breath.  She is anxious about all the things that have been changing around her.  Patient  denies fever chills night sweats weight loss.   Past Medical History:  Diagnosis Date  . Adenoma of left adrenal gland   . Cancer ([place])    Lung  . Colon polyps    hyperplastic  . Complication of anesthesia    Very emotional and cries after anesthesia  . GERD (gastroesophageal reflux disease)   . Kidney stone   . Liver lesion   . Microhematuria      Family History  Problem Relation Age of  Onset  . Arrhythmia Mother        s/p Pacer   . Heart attack Father   . Heart disease Sister   . Colon cancer Neg Hx      Past Surgical History:  Procedure Laterality Date  . ABDOMINAL HYSTERECTOMY    . APPENDECTOMY    . BRONCHIAL BIOPSY  07/18/2020   Procedure: BRONCHIAL BIOPSIES;  Surgeon: [name], DO;  Location: [facility];  Service: Pulmonary;;  . BRONCHIAL BRUSHINGS  07/18/2020   Procedure: BRONCHIAL BRUSHINGS;  Surgeon: [name], DO;  Location: [facility];  Service: Pulmonary;;  . BRONCHIAL NEEDLE ASPIRATION BIOPSY  07/18/2020   Procedure: BRONCHIAL NEEDLE ASPIRATION BIOPSIES;  Surgeon: [name], DO;  Location: [facility];  Service: Pulmonary;;  . BRONCHIAL WASHINGS  07/18/2020   Procedure: BRONCHIAL WASHINGS;  Surgeon: [name], DO;  Location: [facility];  Service: Pulmonary;;  . FIDUCIAL MARKER PLACEMENT  07/18/2020   Procedure: FIDUCIAL MARKER PLACEMENT;  Surgeon: [name], DO;  Location: [facility];  Service: Pulmonary;;  . INTERCOSTAL NERVE BLOCK Right 08/26/2020   Procedure: INTERCOSTAL NERVE BLOCK;  Surgeon: [name], MD;  Location: [facility];  Service: Thoracic;  Laterality: Right;  . IR THORACENTESIS ASP PLEURAL SPACE W/IMG GUIDE  10/10/2020  . NODE DISSECTION Right 08/26/2020   Procedure: NODE DISSECTION;  Surgeon: [name], MD;  Location: [facility];  Service: Thoracic;  Laterality: Right;  Marland Kitchen VIDEO BRONCHOSCOPY WITH ENDOBRONCHIAL NAVIGATION N/A 07/18/2020   Procedure: VIDEO BRONCHOSCOPY WITH ENDOBRONCHIAL NAVIGATION;  Surgeon: [name], DO;  Location: [facility];  Service: Pulmonary;  Laterality: N/A;  . VIDEO BRONCHOSCOPY WITH ENDOBRONCHIAL ULTRASOUND  07/18/2020   Procedure: VIDEO BRONCHOSCOPY WITH ENDOBRONCHIAL ULTRASOUND;  Surgeon: [name], DO;  Location: [facility];  Service: Pulmonary;;    Social History   Socioeconomic History  . Marital status: Married    Spouse name: Not  on file  . Number of children: 3  . Years of education: Not on file  . Highest education level: Not on file  Occupational History  . Not on file  Tobacco Use  . Smoking status: Former Smoker    Packs/day: 0.30    Years: 60.00    Pack years: 18.00    Types: Cigarettes    Quit date: 06/11/2020    Years since quitting: 0.3  . Smokeless tobacco: Never Used  Vaping Use  . Vaping Use: Never used  Substance and Sexual Activity  . Alcohol use: Yes    Comment: 0.5 drink per month  . Drug use: No  . Sexual activity: Not Currently    Birth control/protection: Surgical    Comment: Hysterectomy  Other Topics Concern  . 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    Social Determinants of Health   Financial Resource Strain: Not on file  Food Insecurity: Not on file  Transportation Needs: Not on file  Physical Activity: Not  on file  Stress: Not on file  Social Connections: Not on file  Intimate Partner Violence: Not on file     Allergies  Allergen Reactions  . Penicillins Hives    REACTION: 20 years     Outpatient Medications Prior to Visit  Medication Sig Dispense Refill  . acetaminophen (TYLENOL) 500 MG tablet Take 2 tablets (1,000 mg total) by mouth every 6 (six) hours as needed. 30 tablet 0  . albuterol (VENTOLIN HFA) 108 (90 Base) MCG/ACT inhaler Inhale 1-2 puffs into the lungs every 4 (four) hours as needed for wheezing or shortness of breath.    Marland Kitchen alendronate (FOSAMAX) 70 MG tablet Take 70 mg by mouth every Saturday.    . ALPRAZolam (XANAX) 1 MG tablet Take 1 mg by mouth 3 (three) times daily as needed for anxiety or sleep.    . Calcium Carbonate-Vitamin D (CALCIUM + D PO) Take 600 mg by mouth 2 (two) times daily.    . cyclobenzaprine (FLEXERIL) 5 MG tablet Take 5 mg by mouth 3 (three) times daily as needed for muscle spasms (sciatic nerve).     Marland Kitchen dexamethasone (DECADRON) 4 MG tablet 1 tablet p.o. twice daily the day before, day of and day after the  chemotherapy every 3 weeks. 40 tablet 0  . folic acid (FOLVITE) 1 MG tablet Take 1 tablet (1 mg total) by mouth daily. 30 tablet 4  . gabapentin (NEURONTIN) 100 MG capsule Take 100 mg by mouth at bedtime.    Marland Kitchen guaiFENesin (MUCINEX) 600 MG 12 hr tablet Take 2 tablets (1,200 mg total) by mouth 2 (two) times daily as needed.    Marland Kitchen ibuprofen (ADVIL) 800 MG tablet Take 1 tablet (800 mg total) by mouth every 8 (eight) hours as needed for moderate pain. 30 tablet 0  . lidocaine (LIDODERM) 5 % Place 1 patch onto the skin daily as needed. Apply patch to area most significant pain once per day.  Remove and discard patch within 12 hours of application. (Patient taking differently: Place 1 patch onto the skin daily as needed (Pain). Apply patch to area most significant pain once per day.  Remove and discard patch within 12 hours of application.) 30 patch 0  . Multiple Vitamin (MULTIVITAMIN WITH MINERALS) TABS Take 1 tablet by mouth daily.    . naproxen (NAPROSYN) 375 MG tablet Take 1 tablet (375 mg total) by mouth 2 (two) times daily as needed for moderate pain. 8 tablet 0  . omeprazole (PRILOSEC) 40 MG capsule Take 40 mg by mouth 2 (two) times daily.    Marland Kitchen oxyCODONE-acetaminophen (PERCOCET) 10-325 MG tablet Take 1 tablet by mouth every 4 (four) hours as needed for moderate pain.     Marland Kitchen prochlorperazine (COMPAZINE) 10 MG tablet Take 1 tablet (10 mg total) by mouth every 6 (six) hours as needed for nausea or vomiting. 30 tablet 0  . rosuvastatin (CRESTOR) 20 MG tablet Take 1 tablet (20 mg total) by mouth daily. (Patient taking differently: Take 10 mg by mouth at bedtime.) 30 tablet 0  . Tiotropium Bromide-Olodaterol (STIOLTO RESPIMAT) 2.5-2.5 MCG/ACT AERS Inhale 2 puffs into the lungs daily. 4 g 0   No facility-administered medications prior to visit.    Review of Systems  Constitutional: Positive for malaise/fatigue and weight loss. Negative for chills and fever.  HENT: Negative for hearing loss, sore throat and  tinnitus.   Eyes: Negative for blurred vision and double vision.  Respiratory: Positive for shortness of breath. Negative for cough, hemoptysis,  sputum production, wheezing and stridor.   Cardiovascular: Negative for chest pain, palpitations, orthopnea, leg swelling and PND.  Gastrointestinal: Negative for abdominal pain, constipation, diarrhea, heartburn, nausea and vomiting.  Genitourinary: Negative for dysuria, hematuria and urgency.  Musculoskeletal: Negative for joint pain and myalgias.  Skin: Negative for itching and rash.  Neurological: Negative for dizziness, tingling, weakness and headaches.  Endo/Heme/Allergies: Negative for environmental allergies. Does not bruise/bleed easily.  Psychiatric/Behavioral: Negative for depression. The patient is not nervous/anxious and does not have insomnia.   All other systems reviewed and are negative.    Objective:  Physical Exam Vitals reviewed.  Constitutional:      General: She is not in acute distress.    Appearance: She is well-developed and well-nourished.     Comments: Thin  HENT:     Head: Normocephalic and atraumatic.     Mouth/Throat:     Mouth: Oropharynx is clear and moist.  Eyes:     General: No scleral icterus.    Conjunctiva/sclera: Conjunctivae normal.     Pupils: Pupils are equal, round, and reactive to light.  Neck:     Vascular: No JVD.     Trachea: No tracheal deviation.  Cardiovascular:     Rate and Rhythm: Normal rate and regular rhythm.     Pulses: Intact distal pulses.     Heart sounds: Normal heart sounds. No murmur heard.   Pulmonary:     Effort: Pulmonary effort is normal. No tachypnea, accessory muscle usage or respiratory distress.     Breath sounds: No stridor. No wheezing, rhonchi or rales.     Comments: Diminished breath sounds in the right base. Abdominal:     General: Bowel sounds are normal. There is no distension.     Palpations: Abdomen is soft.     Tenderness: There is no abdominal  tenderness.  Musculoskeletal:        General: No tenderness or edema.     Cervical back: Neck supple.  Lymphadenopathy:     Cervical: No cervical adenopathy.  Skin:    General: Skin is warm and dry.     Capillary Refill: Capillary refill takes less than 2 seconds.     Findings: No rash.  Neurological:     Mental Status: She is alert and oriented to person, place, and time.  Psychiatric:        Mood and Affect: Mood and affect normal.        Behavior: Behavior normal.      Vitals:   10/17/20 0926  BP: 120/62  Pulse: (!) 53  Temp: (!) 97.4 F (36.3 C)  TempSrc: Tympanic  SpO2: 98%  Weight: 139 lb 2 oz (63.1 kg)  Height: 5\' 4"  (1.626 m)   98% on RA BMI Readings from Last 3 Encounters:  10/17/20 23.88 kg/m  10/10/20 23.89 kg/m  10/04/20 23.34 kg/m   Wt Readings from Last 3 Encounters:  10/17/20 139 lb 2 oz (63.1 kg)  10/10/20 139 lb 3.2 oz (63.1 kg)  10/04/20 136 lb (61.7 kg)     CBC    Component Value Date/Time   WBC 5.1 10/10/2020 0941   WBC 10.8 (H) 08/28/2020 0140   RBC 3.68 (L) 10/10/2020 0941   HGB 11.6 (L) 10/10/2020 0941   HCT 36.8 10/10/2020 0941   PLT 243 10/10/2020 0941   MCV 100.0 10/10/2020 0941   MCH 31.5 10/10/2020 0941   MCHC 31.5 10/10/2020 0941   RDW 11.7 10/10/2020 0941   LYMPHSABS 1.7 10/10/2020  0941   MONOABS 0.5 10/10/2020 0941   EOSABS 0.3 10/10/2020 0941   BASOSABS 0.0 10/10/2020 0941     Chest Imaging: 06/11/2020 coronary calcium CT scan images: Report reviewed in [facility] A right upper lobe 1.8 cm lung nodule concerning for primary bronchogenic carcinoma. The patient's images have been independently reviewed by me.    Pulmonary Functions Testing Results: PFT Results Latest Ref Rng & Units 06/24/2020  FVC-Pre L 1.59  FVC-Predicted Pre % 69  FVC-Post L 1.89  FVC-Predicted Post % 82  Pre FEV1/FVC % % 67  Post FEV1/FCV % % 68  FEV1-Pre L 1.06  FEV1-Predicted Pre % 59  FEV1-Post L 1.28  DLCO uncorrected  ml/min/mmHg 10.29  DLCO UNC% % 53  DLVA Predicted % 85  TLC L 4.78  TLC % Predicted % 94  RV % Predicted % 142    FeNO:   Pathology:   08/26/2020 RUL Lobectomy:  . LUNG, RIGHT UPPER LOBE, LOBECTOMY:  - Multifocal invasive adenocarcinoma, moderately differentiated, 1.4 cm  - Carcinoma involves the pleura  - Margins uninvolved by adenocarcinoma  - See oncology table and comment below   10/10/2020:  Specimen Submitted: A. PLEURAL FLUID, RIGHT, THORACENTESIS:  FINAL MICROSCOPIC DIAGNOSIS:  - Atypical cells present  SPECIMEN ADEQUACY:  Satisfactory for evaluation  DIAGNOSTIC COMMENTS:  IHC for TTF-1 identifies rare cells morphologically indistinct from cell  block material. D2-40 is positive in mesothelial cells. CD163 is  positive in histiocytes. MOC31 is negative. Should fluid  re-accumulate, cytology may be helpful  Echocardiogram:   Heart Catheterization:     Assessment & Plan:     ICD-10-CM   1. Adenocarcinoma of lung, stage IIb, right (HCC)  C34.91    LUNG, RIGHT UPPER LOBE, LOBECTOMY:  - Multifocal invasive adenocarcinoma, moderately differentiated, 1.4 cm  - Carcinoma involves the pleura  2. Adenocarcinoma of left lung, stage 1 (HCC)  C34.92   3. Former smoker  Z87.891   4. Centrilobular emphysema ([place])  J43.2   5. Pleural effusion on right  J90 DG Chest 2 View    Discussion:  This is a 75 year old female, T2b, N0, M0 adenocarcinoma of the right upper lobe, stage I left lower lobe lung cancer status post right upper lobe lobectomy.  Now with recurrent right-sided pleural effusion, now since is recurred so quickly within a week concern for malignant effusion is possible.  Former smoker 60+ pack year history.  COPD currently on Stiolto  Plan: Discussed pleural fluid analysis from IR guided thoracentesis from last week.  Ultrasound imaging today with recurrent right-sided effusion Discussed risk benefits and alternatives of proceeding with office  thoracentesis today. Patient is agreeable for this due to recurrence of effusion within 1 week. We will plan to send for repeat cytology evaluation. Continue Stiolto Continue as needed albuterol.    Current Outpatient Medications:  .  acetaminophen (TYLENOL) 500 MG tablet, Take 2 tablets (1,000 mg total) by mouth every 6 (six) hours as needed., Disp: 30 tablet, Rfl: 0 .  albuterol (VENTOLIN HFA) 108 (90 Base) MCG/ACT inhaler, Inhale 1-2 puffs into the lungs every 4 (four) hours as needed for wheezing or shortness of breath., Disp: , Rfl:  .  alendronate (FOSAMAX) 70 MG tablet, Take 70 mg by mouth every Saturday., Disp: , Rfl:  .  ALPRAZolam (XANAX) 1 MG tablet, Take 1 mg by mouth 3 (three) times daily as needed for anxiety or sleep., Disp: , Rfl:  .  Calcium Carbonate-Vitamin D (CALCIUM + D  PO), Take 600 mg by mouth 2 (two) times daily., Disp: , Rfl:  .  cyclobenzaprine (FLEXERIL) 5 MG tablet, Take 5 mg by mouth 3 (three) times daily as needed for muscle spasms (sciatic nerve). , Disp: , Rfl:  .  dexamethasone (DECADRON) 4 MG tablet, 1 tablet p.o. twice daily the day before, day of and day after the chemotherapy every 3 weeks., Disp: 40 tablet, Rfl: 0 .  folic acid (FOLVITE) 1 MG tablet, Take 1 tablet (1 mg total) by mouth daily., Disp: 30 tablet, Rfl: 4 .  gabapentin (NEURONTIN) 100 MG capsule, Take 100 mg by mouth at bedtime., Disp: , Rfl:  .  guaiFENesin (MUCINEX) 600 MG 12 hr tablet, Take 2 tablets (1,200 mg total) by mouth 2 (two) times daily as needed., Disp: , Rfl:  .  ibuprofen (ADVIL) 800 MG tablet, Take 1 tablet (800 mg total) by mouth every 8 (eight) hours as needed for moderate pain., Disp: 30 tablet, Rfl: 0 .  lidocaine (LIDODERM) 5 %, Place 1 patch onto the skin daily as needed. Apply patch to area most significant pain once per day.  Remove and discard patch within 12 hours of application. (Patient taking differently: Place 1 patch onto the skin daily as needed (Pain). Apply patch  to area most significant pain once per day.  Remove and discard patch within 12 hours of application.), Disp: 30 patch, Rfl: 0 .  Multiple Vitamin (MULTIVITAMIN WITH MINERALS) TABS, Take 1 tablet by mouth daily., Disp: , Rfl:  .  naproxen (NAPROSYN) 375 MG tablet, Take 1 tablet (375 mg total) by mouth 2 (two) times daily as needed for moderate pain., Disp: 8 tablet, Rfl: 0 .  omeprazole (PRILOSEC) 40 MG capsule, Take 40 mg by mouth 2 (two) times daily., Disp: , Rfl:  .  oxyCODONE-acetaminophen (PERCOCET) 10-325 MG tablet, Take 1 tablet by mouth every 4 (four) hours as needed for moderate pain. , Disp: , Rfl:  .  prochlorperazine (COMPAZINE) 10 MG tablet, Take 1 tablet (10 mg total) by mouth every 6 (six) hours as needed for nausea or vomiting., Disp: 30 tablet, Rfl: 0 .  rosuvastatin (CRESTOR) 20 MG tablet, Take 1 tablet (20 mg total) by mouth daily. (Patient taking differently: Take 10 mg by mouth at bedtime.), Disp: 30 tablet, Rfl: 0 .  Tiotropium Bromide-Olodaterol (STIOLTO RESPIMAT) 2.5-2.5 MCG/ACT AERS, Inhale 2 puffs into the lungs daily., Disp: 4 g, Rfl: 0   I spent 40 minutes dedicated to the care of this patient on the date of this encounter to include pre-visit review of records, face-to-face time with the patient discussing conditions above, post visit ordering of testing, clinical documentation with the electronic health record, making appropriate referrals as documented, and communicating necessary findings to members of the patients care team.    [name], DO [facility] 10/17/2020 9:37 AM

## 2020-10-18 LAB — TRIGLYCERIDES, BODY FLUIDS: Triglycerides, Fluid: 22 mg/dL

## 2020-10-19 LAB — CHOLESTEROL, BODY FLUID: Cholesterol, Fluid: 68 mg/dL

## 2020-10-21 LAB — BODY FLUID CULTURE: Culture: NO GROWTH

## 2020-10-22 NOTE — Progress Notes (Signed)
Pharmacist Chemotherapy Monitoring - Initial Assessment    Anticipated start date: 10/29/20  Regimen:  . Are orders appropriate based on the patient's diagnosis, regimen, and cycle? Yes . Does the plan date match the patient's scheduled date? Yes . Is the sequencing of drugs appropriate? Yes . Are the premedications appropriate for the patient's regimen? Yes . Prior Authorization for treatment is: Pending o If applicable, is the correct biosimilar selected based on the patient's insurance? not applicable  Organ Function and Labs: Marland Kitchen Are dose adjustments needed based on the patient's renal function, hepatic function, or hematologic function? Yes . Are appropriate labs ordered prior to the start of patient's treatment? Yes . Other organ system assessment, if indicated: N/A . The following baseline labs, if indicated, have been ordered: cisplatin: K, Mg  Dose Assessment: . Are the drug doses appropriate? Yes . Are the following correct: o Drug concentrations Yes o IV fluid compatible with drug Yes o Administration routes Yes o Timing of therapy Yes . If applicable, does the patient have documented access for treatment and/or plans for port-a-cath placement? no . If applicable, have lifetime cumulative doses been properly documented and assessed? not applicable Lifetime Dose Tracking  No doses have been documented on this patient for the following tracked chemicals: Doxorubicin, Epirubicin, Idarubicin, Daunorubicin, Mitoxantrone, Bleomycin, Oxaliplatin, Carboplatin, Liposomal Doxorubicin  o   Toxicity Monitoring/Prevention: . The patient has the following take home antiemetics prescribed: Prochlorperazine . The patient has the following take home medications prescribed: N/A . Medication allergies and previous infusion related reactions, if applicable, have been reviewed and addressed. Yes . The patient's current medication list has been assessed for drug-drug interactions with their  chemotherapy regimen. no significant drug-drug interactions were identified on review.  Order Review: . Are the treatment plan orders signed? Yes . Is the patient scheduled to see a provider prior to their treatment? No  I verify that I have reviewed each item in the above checklist and answered each question accordingly.  Philomena Course, RPH, 10/22/2020  11:14 AM

## 2020-10-23 LAB — CYTOLOGY - NON PAP

## 2020-10-24 NOTE — Telephone Encounter (Signed)
Tonya [name],   Please can let her know that her pleural fluid labs look ok. The cytology does not show malignancy. But is lymphocyte predominate which can be seen post-operatively. She is seeing Dr. Kipp Brood on the 25th. If the effusion has re-accumulated when he sees her we can always tap it dry again. Hopefully it wont reaccumulate.   CC: Dr. Kipp Brood so he can review results   Thanks,  BLI  [name], DO [facility] 10/24/2020 1:42 AM

## 2020-10-25 ENCOUNTER — Telehealth: Payer: Self-pay | Admitting: Pulmonary Disease

## 2020-10-25 NOTE — Telephone Encounter (Signed)
Please can let her know that her pleural fluid labs look ok. The cytology does not show malignancy. But is lymphocyte predominate which can be seen post-operatively. She is seeing Dr. Kipp Brood on the 25th. If the effusion has re-accumulated when he sees her we can always tap it dry again. Hopefully it wont reaccumulate.   I called the pt and she is aware of BI recs.   She stated that she was able to go away for a few days to get herself calmed down for the procedure next week.  She stated that she is not having any other symptoms and she is very thankful for BI.

## 2020-10-28 NOTE — Telephone Encounter (Signed)
Thanks BLI

## 2020-10-29 ENCOUNTER — Inpatient Hospital Stay: Payer: Medicare HMO

## 2020-10-29 ENCOUNTER — Other Ambulatory Visit: Payer: Self-pay

## 2020-10-29 VITALS — BP 133/69 | HR 63 | Temp 98.6°F | Resp 18 | Wt 137.0 lb

## 2020-10-29 DIAGNOSIS — C3491 Malignant neoplasm of unspecified part of right bronchus or lung: Secondary | ICD-10-CM

## 2020-10-29 DIAGNOSIS — Z5111 Encounter for antineoplastic chemotherapy: Secondary | ICD-10-CM | POA: Diagnosis not present

## 2020-10-29 LAB — CBC WITH DIFFERENTIAL (CANCER CENTER ONLY)
Abs Immature Granulocytes: 0.04 10*3/uL (ref 0.00–0.07)
Basophils Absolute: 0 10*3/uL (ref 0.0–0.1)
Basophils Relative: 0 %
Eosinophils Absolute: 0 10*3/uL (ref 0.0–0.5)
Eosinophils Relative: 0 %
HCT: 37 % (ref 36.0–46.0)
Hemoglobin: 12.1 g/dL (ref 12.0–15.0)
Immature Granulocytes: 1 %
Lymphocytes Relative: 17 %
Lymphs Abs: 1.1 10*3/uL (ref 0.7–4.0)
MCH: 31.2 pg (ref 26.0–34.0)
MCHC: 32.7 g/dL (ref 30.0–36.0)
MCV: 95.4 fL (ref 80.0–100.0)
Monocytes Absolute: 0.7 10*3/uL (ref 0.1–1.0)
Monocytes Relative: 10 %
Neutro Abs: 4.8 10*3/uL (ref 1.7–7.7)
Neutrophils Relative %: 72 %
Platelet Count: 320 10*3/uL (ref 150–400)
RBC: 3.88 MIL/uL (ref 3.87–5.11)
RDW: 11.5 % (ref 11.5–15.5)
WBC Count: 6.7 10*3/uL (ref 4.0–10.5)
nRBC: 0 % (ref 0.0–0.2)

## 2020-10-29 LAB — CMP (CANCER CENTER ONLY)
ALT: 8 U/L (ref 0–44)
AST: 13 U/L — ABNORMAL LOW (ref 15–41)
Albumin: 3.8 g/dL (ref 3.5–5.0)
Alkaline Phosphatase: 87 U/L (ref 38–126)
Anion gap: 10 (ref 5–15)
BUN: 10 mg/dL (ref 8–23)
CO2: 22 mmol/L (ref 22–32)
Calcium: 9.9 mg/dL (ref 8.9–10.3)
Chloride: 109 mmol/L (ref 98–111)
Creatinine: 0.74 mg/dL (ref 0.44–1.00)
GFR, Estimated: >60 mL/min (ref >60)
Glucose, Bld: 117 mg/dL — ABNORMAL HIGH (ref 70–99)
Potassium: 3.9 mmol/L (ref 3.5–5.1)
Sodium: 141 mmol/L (ref 135–145)
Total Bilirubin: 0.3 mg/dL (ref 0.3–1.2)
Total Protein: 7.6 g/dL (ref 6.5–8.1)

## 2020-10-29 LAB — MAGNESIUM: Magnesium: 1.8 mg/dL (ref 1.7–2.4)

## 2020-10-29 MED ORDER — SODIUM CHLORIDE 0.9 % IV SOLN
150.0000 mg | Freq: Once | INTRAVENOUS | Status: AC
  Administered 2020-10-29: 150 mg via INTRAVENOUS
  Filled 2020-10-29: qty 150

## 2020-10-29 MED ORDER — MAGNESIUM SULFATE 2 GM/50ML IV SOLN
INTRAVENOUS | Status: AC
  Filled 2020-10-29: qty 50

## 2020-10-29 MED ORDER — PALONOSETRON HCL INJECTION 0.25 MG/5ML
0.2500 mg | Freq: Once | INTRAVENOUS | Status: AC
  Administered 2020-10-29: 0.25 mg via INTRAVENOUS

## 2020-10-29 MED ORDER — SODIUM CHLORIDE 0.9 % IV SOLN
75.0000 mg/m2 | Freq: Once | INTRAVENOUS | Status: AC
  Administered 2020-10-29: 127 mg via INTRAVENOUS
  Filled 2020-10-29: qty 127

## 2020-10-29 MED ORDER — SODIUM CHLORIDE 0.9 % IV SOLN
Freq: Once | INTRAVENOUS | Status: AC
  Filled 2020-10-29: qty 250

## 2020-10-29 MED ORDER — MAGNESIUM SULFATE 2 GM/50ML IV SOLN
2.0000 g | Freq: Once | INTRAVENOUS | Status: AC
  Administered 2020-10-29: 2 g via INTRAVENOUS

## 2020-10-29 MED ORDER — SODIUM CHLORIDE 0.9 % IV SOLN
10.0000 mg | Freq: Once | INTRAVENOUS | Status: AC
  Administered 2020-10-29: 10 mg via INTRAVENOUS
  Filled 2020-10-29: qty 10

## 2020-10-29 MED ORDER — PEMETREXED DISODIUM CHEMO INJECTION 500 MG
500.0000 mg/m2 | Freq: Once | INTRAVENOUS | Status: AC
  Administered 2020-10-29: 850 mg via INTRAVENOUS
  Filled 2020-10-29: qty 20

## 2020-10-29 MED ORDER — PALONOSETRON HCL INJECTION 0.25 MG/5ML
INTRAVENOUS | Status: AC
  Filled 2020-10-29: qty 5

## 2020-10-29 MED ORDER — POTASSIUM CHLORIDE IN NACL 20-0.9 MEQ/L-% IV SOLN
Freq: Once | INTRAVENOUS | Status: AC
  Filled 2020-10-29: qty 1000

## 2020-10-29 NOTE — Patient Instructions (Signed)
[facility] Discharge Instructions for Patients Receiving Chemotherapy  Today you received the following chemotherapy agents Cisplatin; Alimta  To help prevent nausea and vomiting after your treatment, we encourage you to take your nausea medication as directed   If you develop nausea and vomiting that is not controlled by your nausea medication, call the clinic.   BELOW ARE SYMPTOMS THAT SHOULD BE REPORTED IMMEDIATELY:  *FEVER GREATER THAN 100.5 F  *CHILLS WITH OR WITHOUT FEVER  NAUSEA AND VOMITING THAT IS NOT CONTROLLED WITH YOUR NAUSEA MEDICATION  *UNUSUAL SHORTNESS OF BREATH  *UNUSUAL BRUISING OR BLEEDING  TENDERNESS IN MOUTH AND THROAT WITH OR WITHOUT PRESENCE OF ULCERS  *URINARY PROBLEMS  *BOWEL PROBLEMS  UNUSUAL RASH Items with * indicate a potential emergency and should be followed up as soon as possible.  Feel free to call the clinic should you have any questions or concerns. The clinic phone number is [phone].  Please show the [facility] at check-in to the Emergency Department and triage nurse.

## 2020-10-30 ENCOUNTER — Other Ambulatory Visit: Payer: Self-pay | Admitting: Thoracic Surgery (Cardiothoracic Vascular Surgery)

## 2020-10-30 ENCOUNTER — Telehealth: Payer: Self-pay | Admitting: *Deleted

## 2020-10-30 DIAGNOSIS — C3491 Malignant neoplasm of unspecified part of right bronchus or lung: Secondary | ICD-10-CM

## 2020-10-30 NOTE — Telephone Encounter (Signed)
Called pt to see how she did with her treatment.  She reports some nausea this am & some fatigue.  She reports not taking any nausea med but eating & drinking well.  Discussed not letting nausea get out of hand & she reports understanding to take if least bit of nausea.  SHe is not nauseated now.  Discuss oral fluid intake.  She reports knowing how to reach [place] if needed.

## 2020-10-30 NOTE — Telephone Encounter (Signed)
-----   Message from Willia Craze, RN sent at 10/29/2020  3:44 PM EST ----- Regarding: Dr.Mohamed, First time chemo Dr.Mohamed pt, first time pemetrexed and cisplatin on 10/29/20, pt tolerated well. Thanks!

## 2020-10-31 ENCOUNTER — Telehealth: Payer: Self-pay | Admitting: Pulmonary Disease

## 2020-10-31 NOTE — Telephone Encounter (Signed)
Called and spoke with pt and she stated that she feels like her lung is getting full again.  She had her first chemo tx Tuesday.  She stated that she feels like a band around her naval area, and this is where she felt this before.  She is not having any SHOB or cough. Pt stated that she has appt today with her medical doctor today at 1130 today and appt tomorrow with Dr. Kipp Brood at 1030.    I spoke with BI--he stated that he is working in the hospital the rest of the week.  He would like for the pt to be seen by Dr. Kipp Brood and have a CXR and if Dr. Kipp Brood feels that she needs to be drained again, then he can reach out to [facility] and they can set it up.  I called and spoke with pt and she is aware and she was advised if she gets [facility] or coughing that she should seek emergency help.  Pt voiced her understanding.   Allergies  Allergen Reactions  . Penicillins Hives    REACTION: 20 years

## 2020-11-01 ENCOUNTER — Telehealth: Payer: Self-pay | Admitting: Pulmonary Disease

## 2020-11-01 ENCOUNTER — Ambulatory Visit
Admission: RE | Admit: 2020-11-01 | Discharge: 2020-11-01 | Disposition: A | Discharge status: Home / Self Care | Payer: Medicare HMO | Source: Ambulatory Visit | Attending: Thoracic Surgery (Cardiothoracic Vascular Surgery) | Admitting: Thoracic Surgery (Cardiothoracic Vascular Surgery)

## 2020-11-01 ENCOUNTER — Other Ambulatory Visit: Payer: Self-pay

## 2020-11-01 ENCOUNTER — Ambulatory Visit (INDEPENDENT_AMBULATORY_CARE_PROVIDER_SITE_OTHER): Payer: Self-pay | Admitting: Thoracic Surgery (Cardiothoracic Vascular Surgery)

## 2020-11-01 ENCOUNTER — Other Ambulatory Visit (HOSPITAL_COMMUNITY)
Admission: RE | Admit: 2020-11-01 | Discharge: 2020-11-01 | Disposition: A | Discharge status: Home / Self Care | Payer: Medicare HMO | Source: Ambulatory Visit | Attending: Pulmonary Disease | Admitting: Pulmonary Disease

## 2020-11-01 ENCOUNTER — Encounter: Payer: Self-pay | Admitting: Thoracic Surgery (Cardiothoracic Vascular Surgery)

## 2020-11-01 VITALS — BP 130/74 | HR 60 | Resp 20 | Ht 64.0 in | Wt 142.0 lb

## 2020-11-01 DIAGNOSIS — J90 Pleural effusion, not elsewhere classified: Secondary | ICD-10-CM | POA: Diagnosis present

## 2020-11-01 DIAGNOSIS — Z01812 Encounter for preprocedural laboratory examination: Secondary | ICD-10-CM | POA: Diagnosis present

## 2020-11-01 DIAGNOSIS — C3491 Malignant neoplasm of unspecified part of right bronchus or lung: Secondary | ICD-10-CM

## 2020-11-01 DIAGNOSIS — Z902 Acquired absence of lung [part of]: Secondary | ICD-10-CM

## 2020-11-01 DIAGNOSIS — Z20822 Contact with and (suspected) exposure to covid-19: Secondary | ICD-10-CM | POA: Insufficient documentation

## 2020-11-01 LAB — SARS CORONAVIRUS 2 (TAT 6-24 HRS): SARS Coronavirus 2: NEGATIVE

## 2020-11-01 MED ORDER — PREGABALIN 25 MG PO CAPS: 25.0000 mg | ORAL_CAPSULE | Freq: Two times a day (BID) | ORAL | 0 refills | Status: DC

## 2020-11-01 NOTE — Telephone Encounter (Signed)
I have called the pt and she is going for her covid testing today.  Tonya [name] has scheduled her for [facility] on Monday at 2 pm.  Pt is aware that she will need to arrive by 130.  Pt voiced her understanding.

## 2020-11-01 NOTE — Telephone Encounter (Signed)
error 

## 2020-11-01 NOTE — Telephone Encounter (Signed)
PCCM:  I received call from Dr. Kipp Brood. Patient seen in his office today. Would like for right effusion to be drained again.   Please setup for next week in endoscopy at [facility]. Best days would be Monday afternoon or Wednesday afternoon.   Procedure: Right sided thoracentesis.  Thanks  [name], DO [facility] 11/01/2020 12:12 PM

## 2020-11-01 NOTE — Progress Notes (Signed)
      [address]       [place] [address]             [phone]        Tonya [name] Medical Record #[id number] Date of Birth: September 11, 1945  Referring: Tonya Nash, DO Primary Care: Tonya Landsman, MD Primary Cardiologist:No primary care provider on file.  Reason for visit:   follow-up  History of Present Illness:     Tonya [name] comes in for 1 month appointment.  She has had 2 thoracentesis since last seeing me.  She denies any shortness of breath but does complain of bandlike sensation, but this may be related to her access incision.  She has started her chemotherapy and has had some fatigue post infusion.  She also complains of significant reflux that began the night following her infusion.  It was managed with her regular antacid medication.  Physical Exam: BP 130/74   Pulse 60   Resp 20   Ht 5\' 4"  (1.626 m)   Wt 142 lb (64.4 kg)   SpO2 98% Comment: RA  BMI 24.37 kg/m   Alert NAD Incision clean.  Abdomen soft, ND No peripheral edema   Diagnostic Studies & Laboratory data: CXR: Elevation of the right hemidiaphragm, and small right effusion.     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  She also has a left upper lobe biopsy-proven adenocarcinoma which is likely a synchronous primary.  She has a persistently elevated right hemidiaphragm along with an effusion that has been tapped twice and surgery.  Spoke with Dr. Valeta [name], and she is scheduled to undergo another thoracentesis.  I recommended that she be very aggressive with her ambulation and incentive spirometry which she has not really been doing much of recently.  In regards to her pain I have given her prescription for Lyrica as this may be related to the access incision and stretching of the nerve.  I will see her again in 1 month as a virtual visit to assess the status of her incisional pain.   Tonya [name] 11/01/2020 11:47 AM

## 2020-11-04 ENCOUNTER — Encounter (HOSPITAL_COMMUNITY): Payer: Self-pay | Admitting: Pulmonary Disease

## 2020-11-04 ENCOUNTER — Ambulatory Visit (HOSPITAL_COMMUNITY): Payer: Medicare HMO

## 2020-11-04 ENCOUNTER — Ambulatory Visit (HOSPITAL_COMMUNITY)
Admission: RE | Admit: 2020-11-04 | Discharge: 2020-11-04 | Disposition: A | Discharge status: Home / Self Care | Payer: Medicare HMO | Attending: Pulmonary Disease | Admitting: Pulmonary Disease

## 2020-11-04 ENCOUNTER — Encounter (HOSPITAL_COMMUNITY)
Admission: RE | Disposition: A | Discharge status: Home / Self Care | Payer: Self-pay | Source: Home / Self Care | Attending: Pulmonary Disease

## 2020-11-04 DIAGNOSIS — C3432 Malignant neoplasm of lower lobe, left bronchus or lung: Secondary | ICD-10-CM | POA: Insufficient documentation

## 2020-11-04 DIAGNOSIS — Z8249 Family history of ischemic heart disease and other diseases of the circulatory system: Secondary | ICD-10-CM | POA: Diagnosis not present

## 2020-11-04 DIAGNOSIS — I5189 Other ill-defined heart diseases: Secondary | ICD-10-CM | POA: Insufficient documentation

## 2020-11-04 DIAGNOSIS — J432 Centrilobular emphysema: Secondary | ICD-10-CM | POA: Insufficient documentation

## 2020-11-04 DIAGNOSIS — C782 Secondary malignant neoplasm of pleura: Secondary | ICD-10-CM | POA: Diagnosis not present

## 2020-11-04 DIAGNOSIS — C3491 Malignant neoplasm of unspecified part of right bronchus or lung: Secondary | ICD-10-CM | POA: Insufficient documentation

## 2020-11-04 DIAGNOSIS — J90 Pleural effusion, not elsewhere classified: Secondary | ICD-10-CM | POA: Diagnosis present

## 2020-11-04 DIAGNOSIS — R918 Other nonspecific abnormal finding of lung field: Secondary | ICD-10-CM

## 2020-11-04 DIAGNOSIS — C3411 Malignant neoplasm of upper lobe, right bronchus or lung: Secondary | ICD-10-CM | POA: Diagnosis not present

## 2020-11-04 DIAGNOSIS — Z902 Acquired absence of lung [part of]: Secondary | ICD-10-CM | POA: Insufficient documentation

## 2020-11-04 DIAGNOSIS — Z9889 Other specified postprocedural states: Secondary | ICD-10-CM

## 2020-11-04 DIAGNOSIS — C3492 Malignant neoplasm of unspecified part of left bronchus or lung: Secondary | ICD-10-CM | POA: Insufficient documentation

## 2020-11-04 DIAGNOSIS — Z87891 Personal history of nicotine dependence: Secondary | ICD-10-CM | POA: Diagnosis not present

## 2020-11-04 HISTORY — PX: THORACENTESIS: SHX235

## 2020-11-04 SURGERY — THORACENTESIS
Anesthesia: Topical | Laterality: Right

## 2020-11-04 NOTE — Interval H&P Note (Signed)
History and Physical Interval Note:  11/04/2020 2:20 PM  Tonya [name]  has presented today for surgery, with the diagnosis of Pleural effusion.  The various methods of treatment have been discussed with the patient and family. After consideration of risks, benefits and other options for treatment, the patient has consented to  Procedure(s): THORACENTESIS (Right) as a surgical intervention.  The patient's history has been reviewed, patient examined, no change in status, stable for surgery.  I have reviewed the patient's chart and labs.  Questions were answered to the patient's satisfaction.     [place]

## 2020-11-04 NOTE — Progress Notes (Signed)
350 mL pleural fluid drained from right lung by Dr. Valeta Harms

## 2020-11-04 NOTE — Discharge Instructions (Signed)
Thoracentesis, Care After This sheet gives you information about how to care for yourself after your procedure. Your health care provider may also give you more specific instructions. If you have problems or questions, contact your health care provider. What can I expect after the procedure? After your procedure, it is common to have some pain at the site where the needle was inserted (puncture site). Follow these instructions at home: Care of the puncture site  Follow instructions from your health care provider about how to take care of your puncture site. Make sure you: ? Wash your hands with soap and water before you change your bandage (dressing). If soap and water are not available, use hand sanitizer. ? Change your dressing as told by your health care provider.  Check the puncture site every day for signs of infection. Check for: ? Redness, swelling, or pain. ? Fluid or blood. ? Warmth. ? Pus or a bad smell.  Do not take baths, swim, or use a hot tub until your health care provider approves. General instructions  Take over-the-counter and prescription medicines only as told by your health care provider.  Do not drive for 24 hours if you were given a medicine to help you relax (sedative) during your procedure.  Drink enough fluid to keep your urine pale yellow.  You may return to your normal diet and normal activities as told by your health care provider.  Keep all follow-up visits as told by your health care provider. This is important.   Contact a health care provider if you:  Have redness, swelling, or pain at your puncture site.  Have fluid or blood coming from your puncture site.  Notice that your puncture site feels warm to the touch.  Have pus or a bad smell coming from your puncture site.  Have a fever.  Have chills.  Have nausea or vomiting.  Have trouble breathing.  Develop a worsening cough. Get help right away if you:  Have extreme shortness of  breath.  Develop chest pain.  Faint or feel light-headed. Summary  After your procedure, it is common to have some pain at the site where the needle was inserted (puncture site).  Wash your hands with soap and water before you change your bandage (dressing).  Check your puncture site every day for signs of infection.  Take over-the-counter and prescription medicines only as told by your health care provider. This information is not intended to replace advice given to you by your health care provider. Make sure you discuss any questions you have with your health care provider. Document Revised: 04/25/2020 Document Reviewed: 04/25/2020 Elsevier Patient Education  2021 Elsevier Inc.  

## 2020-11-04 NOTE — Op Note (Signed)
Thoracentesis  Procedure Note  Tonya [name]  [id number]  14-Dec-1945  Date:11/04/20  Time:2:55 PM   Provider Performing:Chibueze Beasley L Braylinn Gulden   Procedure: Thoracentesis with imaging guidance (75170)  Indication(s) Pleural Effusion  Consent Risks of the procedure as well as the alternatives and risks of each were explained to the patient and/or caregiver.  Consent for the procedure was obtained and is signed in the bedside chart  Anesthesia Topical only with 1% lidocaine   Time Out Verified patient identification, verified procedure, site/side was marked, verified correct patient position, special equipment/implants available, medications/allergies/relevant history reviewed, required imaging and test results available.  Sterile Technique Maximal sterile technique including full sterile barrier drape, hand hygiene, sterile gown, sterile gloves, mask, hair covering, sterile ultrasound probe cover (if used).  Procedure Description Ultrasound was used to identify appropriate pleural anatomy for placement and overlying skin marked.  Area of drainage cleaned and draped in sterile fashion. Lidocaine was used to anesthetize the skin and subcutaneous tissue.  350 cc's of amber appearing fluid was drained from the right pleural space. Catheter then removed and bandaid applied to site.  Complications/Tolerance None; patient tolerated the procedure well. Chest X-ray is ordered to confirm no post-procedural complication.  EBL Minimal  Specimen(s) Pleural fluid   [name], DO [facility] 11/04/2020 2:56 PM

## 2020-11-05 ENCOUNTER — Encounter: Payer: Self-pay | Admitting: *Deleted

## 2020-11-05 ENCOUNTER — Inpatient Hospital Stay: Payer: Medicare HMO | Attending: Internal Medicine

## 2020-11-05 ENCOUNTER — Telehealth: Payer: Self-pay | Admitting: Internal Medicine

## 2020-11-05 ENCOUNTER — Other Ambulatory Visit: Payer: Self-pay

## 2020-11-05 ENCOUNTER — Encounter (HOSPITAL_COMMUNITY): Payer: Self-pay | Admitting: Pulmonary Disease

## 2020-11-05 ENCOUNTER — Encounter: Payer: Self-pay | Admitting: Internal Medicine

## 2020-11-05 ENCOUNTER — Inpatient Hospital Stay: Payer: Medicare HMO | Admitting: Internal Medicine

## 2020-11-05 VITALS — BP 121/65 | HR 58 | Temp 98.4°F | Resp 16 | Ht 64.0 in | Wt 133.6 lb

## 2020-11-05 DIAGNOSIS — C3491 Malignant neoplasm of unspecified part of right bronchus or lung: Secondary | ICD-10-CM

## 2020-11-05 DIAGNOSIS — Z5111 Encounter for antineoplastic chemotherapy: Secondary | ICD-10-CM | POA: Diagnosis not present

## 2020-11-05 DIAGNOSIS — J90 Pleural effusion, not elsewhere classified: Secondary | ICD-10-CM | POA: Diagnosis not present

## 2020-11-05 DIAGNOSIS — R110 Nausea: Secondary | ICD-10-CM | POA: Diagnosis not present

## 2020-11-05 DIAGNOSIS — C3411 Malignant neoplasm of upper lobe, right bronchus or lung: Secondary | ICD-10-CM | POA: Insufficient documentation

## 2020-11-05 DIAGNOSIS — C3412 Malignant neoplasm of upper lobe, left bronchus or lung: Secondary | ICD-10-CM | POA: Diagnosis not present

## 2020-11-05 DIAGNOSIS — R5383 Other fatigue: Secondary | ICD-10-CM | POA: Diagnosis not present

## 2020-11-05 LAB — CBC WITH DIFFERENTIAL (CANCER CENTER ONLY)
Abs Immature Granulocytes: 0.03 10*3/uL (ref 0.00–0.07)
Basophils Absolute: 0 10*3/uL (ref 0.0–0.1)
Basophils Relative: 0 %
Eosinophils Absolute: 0.2 10*3/uL (ref 0.0–0.5)
Eosinophils Relative: 3 %
HCT: 35.5 % — ABNORMAL LOW (ref 36.0–46.0)
Hemoglobin: 11.8 g/dL — ABNORMAL LOW (ref 12.0–15.0)
Immature Granulocytes: 0 %
Lymphocytes Relative: 22 %
Lymphs Abs: 1.6 10*3/uL (ref 0.7–4.0)
MCH: 31.6 pg (ref 26.0–34.0)
MCHC: 33.2 g/dL (ref 30.0–36.0)
MCV: 94.9 fL (ref 80.0–100.0)
Monocytes Absolute: 0.4 10*3/uL (ref 0.1–1.0)
Monocytes Relative: 6 %
Neutro Abs: 5 10*3/uL (ref 1.7–7.7)
Neutrophils Relative %: 69 %
Platelet Count: 212 10*3/uL (ref 150–400)
RBC: 3.74 MIL/uL — ABNORMAL LOW (ref 3.87–5.11)
RDW: 11.6 % (ref 11.5–15.5)
WBC Count: 7.2 10*3/uL (ref 4.0–10.5)
nRBC: 0 % (ref 0.0–0.2)

## 2020-11-05 LAB — CMP (CANCER CENTER ONLY)
ALT: 10 U/L (ref 0–44)
AST: 12 U/L — ABNORMAL LOW (ref 15–41)
Albumin: 3.4 g/dL — ABNORMAL LOW (ref 3.5–5.0)
Alkaline Phosphatase: 71 U/L (ref 38–126)
Anion gap: 10 (ref 5–15)
BUN: 15 mg/dL (ref 8–23)
CO2: 26 mmol/L (ref 22–32)
Calcium: 8.9 mg/dL (ref 8.9–10.3)
Chloride: 106 mmol/L (ref 98–111)
Creatinine: 0.79 mg/dL (ref 0.44–1.00)
GFR, Estimated: >60 mL/min (ref >60)
Glucose, Bld: 98 mg/dL (ref 70–99)
Potassium: 3.8 mmol/L (ref 3.5–5.1)
Sodium: 142 mmol/L (ref 135–145)
Total Bilirubin: 0.4 mg/dL (ref 0.3–1.2)
Total Protein: 6.6 g/dL (ref 6.5–8.1)

## 2020-11-05 NOTE — Progress Notes (Signed)
[facility] [phone]   Fax:([phone]) [place], [place] [address]  DIAGNOSIS: Stage IIB (T3, N0, M0) non-small cell lung cancer, adenocarcinoma involving the right upper lobe. The patient also has evidence for adenocarcinoma of the left upper lobe lung nodule as well as subcentimeter pulmonary nodules in the right lung concerning for stage IV lung cancer with multifocal disease bilaterally.  Biomarker Findings  Microsatellite status - MS-Equivocal ? Tumor Mutational Burden - Cannot Be Determined Genomic Findings For a complete list of the genes assayed, please refer to the Appendix. MET [id number]*2 AXL T343M TP53 I254V 7 Disease relevant genes with no reportable alterations: ALK, BRAF, EGFR, ERBB2, KRAS, RET, ROS1  PDL1 Expression 20 %.  PRIOR THERAPY: status post right upper lobectomy with lymph node sampling on August 26, 2020 under the care of Dr. Kipp Brood.  CURRENT THERAPY: Adjuvant systemic chemotherapy with cisplatin 75 mg/M2 and Alimta 550/M2 every 3 weeks. First dose October 22, 2020.  Status post 1 cycle.  INTERVAL HISTORY: Tonya [name] 75 y.o. female returns to the clinic today for follow-up visit accompanied by her husband.  The patient has increasing fatigue and weakness as well as nausea several days after her treatment.  She is trying with oral hydration as much as she can.  She denied having any current chest pain, shortness of breath, cough or hemoptysis.  She underwent thoracentesis under the care of Dr. Kipp Brood.  She denied having any fever or chills.  She has no diarrhea or constipation.  She has no abdominal pain.  She lost few pounds since her last visit.  The patient is here today for evaluation and repeat blood work.    MEDICAL HISTORY: Past Medical History:  Diagnosis Date  . Adenoma of left adrenal gland   . Cancer ([place])    Lung  . Colon polyps     hyperplastic  . Complication of anesthesia    Very emotional and cries after anesthesia  . GERD (gastroesophageal reflux disease)   . Kidney stone   . Liver lesion   . Microhematuria     ALLERGIES:  is allergic to penicillins.  MEDICATIONS:  Current Outpatient Medications  Medication Sig Dispense Refill  . acetaminophen (TYLENOL) 500 MG tablet Take 2 tablets (1,000 mg total) by mouth every 6 (six) hours as needed. 30 tablet 0  . albuterol (VENTOLIN HFA) 108 (90 Base) MCG/ACT inhaler Inhale 1-2 puffs into the lungs every 4 (four) hours as needed for wheezing or shortness of breath.    Marland Kitchen alendronate (FOSAMAX) 70 MG tablet Take 70 mg by mouth every Saturday.    . ALPRAZolam (XANAX) 1 MG tablet Take 1 mg by mouth 3 (three) times daily as needed for anxiety or sleep.    . Calcium Carbonate-Vitamin D (CALCIUM + D PO) Take 1 tablet by mouth in the morning and at bedtime.    . cyclobenzaprine (FLEXERIL) 5 MG tablet Take 5 mg by mouth 3 (three) times daily as needed for muscle spasms (sciatic nerve).     Marland Kitchen dexamethasone (DECADRON) 2 MG tablet Take 4 mg by mouth See admin instructions. Take 2 tablets by mouth twice daily the day before, day of and day after the chemotherapy every 3 weeks.    . folic acid (FOLVITE) 1 MG tablet Take 1 tablet (1 mg total) by mouth daily. (Patient taking differently: Take 1 mg by mouth daily at 12 noon.)  30 tablet 4  . gabapentin (NEURONTIN) 100 MG capsule Take 100 mg by mouth at bedtime.    Marland Kitchen guaiFENesin (MUCINEX) 600 MG 12 hr tablet Take 2 tablets (1,200 mg total) by mouth 2 (two) times daily as needed. (Patient taking differently: Take 1,200 mg by mouth 2 (two) times daily as needed for cough.)    . ibuprofen (ADVIL) 800 MG tablet Take 1 tablet (800 mg total) by mouth every 8 (eight) hours as needed for moderate pain. 30 tablet 0  . Lidocaine 4 % PTCH Place 1 patch onto the skin daily as needed (pain).    . naproxen (NAPROSYN) 375 MG tablet Take 1 tablet (375 mg  total) by mouth 2 (two) times daily as needed for moderate pain. 8 tablet 0  . omeprazole (PRILOSEC) 40 MG capsule Take 40 mg by mouth 2 (two) times daily.    Marland Kitchen oxyCODONE-acetaminophen (PERCOCET) 10-325 MG tablet Take 1 tablet by mouth every 4 (four) hours as needed for moderate pain.     . pregabalin (LYRICA) 25 MG capsule Take 1 capsule (25 mg total) by mouth 2 (two) times daily. 60 capsule 0  . prochlorperazine (COMPAZINE) 10 MG tablet Take 1 tablet (10 mg total) by mouth every 6 (six) hours as needed for nausea or vomiting. 30 tablet 0  . rosuvastatin (CRESTOR) 10 MG tablet Take 10 mg by mouth at bedtime.    . Tiotropium Bromide-Olodaterol (STIOLTO RESPIMAT) 2.5-2.5 MCG/ACT AERS Inhale 2 puffs into the lungs daily. (Patient taking differently: Inhale 2 puffs into the lungs daily as needed (respiratory issues).) 4 g 0   No current facility-administered medications for this visit.    SURGICAL HISTORY:  Past Surgical History:  Procedure Laterality Date  . ABDOMINAL HYSTERECTOMY    . APPENDECTOMY    . BRONCHIAL BIOPSY  07/18/2020   Procedure: BRONCHIAL BIOPSIES;  Surgeon: Josephine Igo, DO;  Location: [facility] ENDOSCOPY;  Service: Pulmonary;;  . BRONCHIAL BRUSHINGS  07/18/2020   Procedure: BRONCHIAL BRUSHINGS;  Surgeon: Josephine Igo, DO;  Location: [facility];  Service: Pulmonary;;  . BRONCHIAL NEEDLE ASPIRATION BIOPSY  07/18/2020   Procedure: BRONCHIAL NEEDLE ASPIRATION BIOPSIES;  Surgeon: Josephine Igo, DO;  Location: [facility]OSCOPY;  Service: Pulmonary;;  . BRONCHIAL WASHINGS  07/18/2020   Procedure: BRONCHIAL WASHINGS;  Surgeon: Josephine Igo, DO;  Location: [facility]OS[facility];  Service: Pulmonary;;  . FIDUCIAL MARKER PLACEMENT  07/18/2020   Procedure: FIDUCIAL MARKER PLACEMENT;  Surgeon: Josephine Igo, DO;  Location: [facility] ENDOSCOPY;  Service: Pulmonary;;  . INTERCOSTAL NERVE BLOCK Right 08/26/2020   Procedure: INTERCOSTAL NERVE BLOCK;  Surgeon: Corliss Skains, MD;  Location:  [facility] OR;  Service: Thoracic;  Laterality: Right;  . IR THORACENTESIS ASP PLEURAL SPACE W/IMG GUIDE  10/10/2020  . NODE DISSECTION Right 08/26/2020   Procedure: NODE DISSECTION;  Surgeon: Corliss Skains, MD;  Location: [facility] OR;  Service: Thoracic;  Laterality: Right;  . THORACENTESIS Right 11/04/2020   Procedure: THORACENTESIS;  Surgeon: Josephine Igo, DO;  Location: [facility] ENDOSCOPY;  Service: Pulmonary;  Laterality: Right;  Marland Kitchen VIDEO BRONCHOSCOPY WITH ENDOBRONCHIAL NAVIGATION N/A 07/18/2020   Procedure: VIDEO BRONCHOSCOPY WITH ENDOBRONCHIAL NAVIGATION;  Surgeon: Josephine Igo, DO;  Location: [facility];  Service: Pulmonary;  Laterality: N/A;  . VIDEO BRONCHOSCOPY WITH ENDOBRONCHIAL ULTRASOUND  07/18/2020   Procedure: VIDEO BRONCHOSCOPY WITH ENDOBRONCHIAL ULTRASOUND;  Surgeon: Josephine Igo, DO;  Location: [facility]OSCOPY;  Service: Pulmonary;;    REVIEW OF SYSTEMS:  Constitutional: positive for fatigue  and weight loss Eyes: negative Ears, nose, mouth, throat, and face: negative Respiratory: negative Cardiovascular: negative Gastrointestinal: positive for nausea Genitourinary:negative Integument/breast: negative Hematologic/lymphatic: negative Musculoskeletal:negative Neurological: negative Behavioral/Psych: negative Endocrine: negative Allergic/Immunologic: negative   PHYSICAL EXAMINATION: General appearance: alert, cooperative, fatigued and no distress Head: Normocephalic, without obvious abnormality, atraumatic Neck: no adenopathy, no JVD, supple, symmetrical, trachea midline and thyroid not enlarged, symmetric, no tenderness/mass/nodules Lymph nodes: Cervical, supraclavicular, and axillary nodes normal. Resp: clear to auscultation bilaterally Back: symmetric, no curvature. ROM normal. No CVA tenderness. Cardio: regular rate and rhythm, S1, S2 normal, no murmur, click, rub or gallop GI: soft, non-tender; bowel sounds normal; no masses,  no organomegaly Extremities: extremities  normal, atraumatic, no cyanosis or edema Neurologic: Alert and oriented X 3, normal strength and tone. Normal symmetric reflexes. Normal coordination and gait  ECOG PERFORMANCE STATUS: 1 - Symptomatic but completely ambulatory  Blood pressure 121/65, pulse (!) 58, temperature 98.4 F (36.9 C), temperature source Tympanic, resp. rate 16, height $RemoveBe'5\' 4"'OqtAfLZKj$  (1.626 m), weight 133 lb 9.6 oz (60.6 kg), SpO2 100 %.  LABORATORY DATA: Lab Results  Component Value Date   WBC 6.7 10/29/2020   HGB 12.1 10/29/2020   HCT 37.0 10/29/2020   MCV 95.4 10/29/2020   PLT 320 10/29/2020      Chemistry      Component Value Date/Time   NA 141 10/29/2020 0820   K 3.9 10/29/2020 0820   CL 109 10/29/2020 0820   CO2 22 10/29/2020 0820   BUN 10 10/29/2020 0820   CREATININE 0.74 10/29/2020 0820      Component Value Date/Time   CALCIUM 9.9 10/29/2020 0820   ALKPHOS 87 10/29/2020 0820   AST 13 (L) 10/29/2020 0820   ALT 8 10/29/2020 0820   BILITOT 0.3 10/29/2020 0820       RADIOGRAPHIC STUDIES: DG Chest 1 View  Result Date: 11/04/2020 CLINICAL DATA:  Post thoracentesis EXAM: CHEST  1 VIEW COMPARISON:  11/01/2020 FINDINGS: Improvement in right pleural effusion with minimal residual effusion. No pneumothorax. Surgical staples in the right lung base. Left lung is clear.  Surgical staples in the left upper lobe. Thoracic scoliosis. Negative for heart failure. Atherosclerotic calcification aorta. IMPRESSION: No complication post right thoracentesis. Electronically Signed   By: Franchot Gallo M.D.   On: 11/04/2020 16:18   DG Chest 1 View  Result Date: 10/10/2020 CLINICAL DATA:  S/p right side thoracentesis.  572ml removed. EXAM: CHEST  1 VIEW COMPARISON:  10/04/2020 FINDINGS: No pneumothorax. Improved right pleural effusion. Improved aeration at the right lung base. Persistent elevation of the right diaphragmatic leaflet. Left lung clear. Heart size and mediastinal contours are within normal limits. Metallic markers  in the left upper lung. Mild lower thoracic levoscoliosis as before. IMPRESSION: Improved right pleural effusion, no pneumothorax. Electronically Signed   By: Lucrezia Europe M.D.   On: 10/10/2020 15:02   DG Chest 2 View  Result Date: 11/01/2020 CLINICAL DATA:  Adenocarcinoma right lung stage II EXAM: CHEST - 2 VIEW COMPARISON:  October 17, 2020 FINDINGS: There is persistent elevation of the right hemidiaphragm. There is a degree of pleural effusion on the right. There is stable opacity in the right perihilar region. Left lung is clear. Heart size is normal. No adenopathy is appreciable by radiography. There is calcification in the lateral right shoulder, likely representing calcific tendinosis. IMPRESSION: New right pleural effusion. Persistent elevation right hemidiaphragm. Stable airspace opacity in the right hilum which potentially may represent atelectasis. A well-defined mass is not  seen by radiography. Stable cardiac silhouette. Left lung clear. Suspect calcific tendinosis lateral right shoulder. Electronically Signed   By: Lowella Grip III M.D.   On: 11/01/2020 11:00   DG Chest 2 View  Result Date: 10/17/2020 CLINICAL DATA:  Status post thoracentesis.  Right pleural effusion. EXAM: CHEST - 2 VIEW COMPARISON:  10/10/2020 FINDINGS: Asymmetric elevation right hemidiaphragm, similar to prior. Right base ill-defined opacity is more prominent than before, potentially reflecting scarring although infectious/inflammatory etiology not excluded. Left lung hyperexpanded with clips/fiducial markers overlying the apex. Cardiopericardial silhouette is at upper limits of normal for size. Thoracolumbar scoliosis again noted. IMPRESSION: 1. No pneumothorax status post thoracentesis. 2. Ill-defined opacity at the right lung base likely scarring although infectious/inflammatory etiology not excluded. Electronically Signed   By: Misty Stanley M.D.   On: 10/17/2020 10:58   IR THORACENTESIS ASP PLEURAL SPACE W/IMG  GUIDE  Result Date: 10/10/2020 INDICATION: Patient with history of lung adenocarcinoma with prior right upper lobectomy, right pleural effusion. Request received for diagnostic and therapeutic right thoracentesis. EXAM: ULTRASOUND GUIDED DIAGNOSTIC AND THERAPEUTIC RIGHT THORACENTESIS MEDICATIONS: 1% lidocaine to skin and subcutaneous tissue COMPLICATIONS: None immediate. PROCEDURE: An ultrasound guided thoracentesis was thoroughly discussed with the patient and questions answered. The benefits, risks, alternatives and complications were also discussed. The patient understands and wishes to proceed with the procedure. Written consent was obtained. Ultrasound was performed to localize and mark an adequate pocket of fluid in the right chest. The area was then prepped and draped in the normal sterile fashion. 1% Lidocaine was used for local anesthesia. Under ultrasound guidance a 6 Fr Safe-T-Centesis catheter was introduced. Thoracentesis was performed. The catheter was removed and a dressing applied. FINDINGS: A total of approximately 550 cc of hazy, amber fluid was removed. Samples were sent to the laboratory as requested by the clinical team. IMPRESSION: Successful ultrasound guided diagnostic and therapeutic right thoracentesis yielding 550 cc of pleural fluid. Read by: Rowe Robert, PA-C Electronically Signed   By: Jacqulynn Cadet M.D.   On: 10/10/2020 15:01    ASSESSMENT AND PLAN: This is a very pleasant 75 years old African-American female recently diagnosed with a stage IIb (T3, N0, M0) non-small cell lung cancer, adenocarcinoma involving the right upper lobe with 2 nodules. The patient also has evidence for adenocarcinoma of the left upper lobe and subcentimeter nodules in the right lung concerning for stage IV with multifocal disease bilaterally. She is status post right upper lobectomy with lymph node dissection on August 26, 2020 under the care of Dr. Kipp Brood. Her molecular studies by [facility]  showed no actionable mutations. She has PD-L1 expression of 20%. She is currently undergoing adjuvant treatment with systemic chemotherapy with cisplatin 75 mg/M2 and Alimta 500 mg/M2 every 3 weeks status post 1 cycle started last week.  She tolerated the first week of her treatment well except for the fatigue and delayed nausea. I had a lengthy discussion with the patient today about her current condition and management of the fatigue and nausea. I recommended for her to take her Compazine on a scheduled basis few days after the treatment.  She was also advised to extend her treatment with Decadron for 3-4 more days after the treatment. The patient was also encouraged to eat more small frequent meals and increase her hydration. I will see her back for follow-up visit in 2 weeks for evaluation before the next cycle of her treatment. She was advised to call immediately if she has any other concerning symptoms  in the interval. The patient voices understanding of current disease status and treatment options and is in agreement with the current care plan.  All questions were answered. The patient knows to call the clinic with any problems, questions or concerns. We can certainly see the patient much sooner if necessary.  The total time spent in the appointment was 35 minutes.  Disclaimer: This note was dictated with voice recognition software. Similar sounding words can inadvertently be transcribed and may not be corrected upon review.

## 2020-11-05 NOTE — Progress Notes (Signed)
Met with patient to introduce myself as Arboriculturist and to offer available resources.  Discussed one-time $1000 Radio broadcast assistant to assist with personal expenses while going through treatment.  Gave her my card if interested in applying and for any additional financial questions or concerns.

## 2020-11-05 NOTE — Progress Notes (Signed)
  Oncology Nurse Navigator Documentation  Oncology Nurse Navigator Flowsheets 11/05/2020  Abnormal Finding Date -  Confirmed Diagnosis Date -  Diagnosis Status -  Planned Course of Treatment -  Phase of Treatment Chemo  Chemotherapy Actual Start Date: 10/29/2020  Surgery Actual Start Date: -  Navigator Follow Up Date: 11/05/2020  Navigator Follow Up Reason: Follow-up Appointment  Navigator Location [facility]  Navigator Encounter Type Clinic/MDC  Treatment Initiated Date -  Patient Visit Type MedOnc  Treatment Phase Treatment  Barriers/Navigation Needs -  Education -  Interventions Psycho-Social Support  Acuity Level 2-Minimal Needs (1-2 Barriers Identified)  Education Method -  Time Spent with Patient 15

## 2020-11-05 NOTE — Telephone Encounter (Signed)
Scheduled appointments per 3/1 los. Spoke to patient who is aware of appointments dates and times. Gave patient calendar print out.

## 2020-11-06 LAB — CYTOLOGY - NON PAP

## 2020-11-12 ENCOUNTER — Other Ambulatory Visit: Payer: Self-pay

## 2020-11-12 ENCOUNTER — Inpatient Hospital Stay: Payer: Medicare HMO

## 2020-11-12 DIAGNOSIS — Z5111 Encounter for antineoplastic chemotherapy: Secondary | ICD-10-CM | POA: Diagnosis not present

## 2020-11-12 DIAGNOSIS — C3491 Malignant neoplasm of unspecified part of right bronchus or lung: Secondary | ICD-10-CM

## 2020-11-12 LAB — CBC WITH DIFFERENTIAL (CANCER CENTER ONLY)
Abs Immature Granulocytes: 0 10*3/uL (ref 0.00–0.07)
Basophils Absolute: 0 10*3/uL (ref 0.0–0.1)
Basophils Relative: 1 %
Eosinophils Absolute: 0.2 10*3/uL (ref 0.0–0.5)
Eosinophils Relative: 4 %
HCT: 31.1 % — ABNORMAL LOW (ref 36.0–46.0)
Hemoglobin: 10 g/dL — ABNORMAL LOW (ref 12.0–15.0)
Immature Granulocytes: 0 %
Lymphocytes Relative: 36 %
Lymphs Abs: 1.2 10*3/uL (ref 0.7–4.0)
MCH: 31.1 pg (ref 26.0–34.0)
MCHC: 32.2 g/dL (ref 30.0–36.0)
MCV: 96.6 fL (ref 80.0–100.0)
Monocytes Absolute: 0.3 10*3/uL (ref 0.1–1.0)
Monocytes Relative: 8 %
Neutro Abs: 1.7 10*3/uL (ref 1.7–7.7)
Neutrophils Relative %: 51 %
Platelet Count: 191 10*3/uL (ref 150–400)
RBC: 3.22 MIL/uL — ABNORMAL LOW (ref 3.87–5.11)
RDW: 11.8 % (ref 11.5–15.5)
WBC Count: 3.4 10*3/uL — ABNORMAL LOW (ref 4.0–10.5)
nRBC: 0 % (ref 0.0–0.2)

## 2020-11-12 LAB — CMP (CANCER CENTER ONLY)
ALT: 13 U/L (ref 0–44)
AST: 15 U/L (ref 15–41)
Albumin: 3.2 g/dL — ABNORMAL LOW (ref 3.5–5.0)
Alkaline Phosphatase: 70 U/L (ref 38–126)
Anion gap: 4 — ABNORMAL LOW (ref 5–15)
BUN: 9 mg/dL (ref 8–23)
CO2: 25 mmol/L (ref 22–32)
Calcium: 8.4 mg/dL — ABNORMAL LOW (ref 8.9–10.3)
Chloride: 111 mmol/L (ref 98–111)
Creatinine: 0.67 mg/dL (ref 0.44–1.00)
GFR, Estimated: >60 mL/min (ref >60)
Glucose, Bld: 83 mg/dL (ref 70–99)
Potassium: 4.2 mmol/L (ref 3.5–5.1)
Sodium: 140 mmol/L (ref 135–145)
Total Bilirubin: 0.3 mg/dL (ref 0.3–1.2)
Total Protein: 6 g/dL — ABNORMAL LOW (ref 6.5–8.1)

## 2020-11-12 LAB — MAGNESIUM: Magnesium: 1.6 mg/dL — ABNORMAL LOW (ref 1.7–2.4)

## 2020-11-19 ENCOUNTER — Inpatient Hospital Stay: Payer: Medicare HMO

## 2020-11-19 ENCOUNTER — Encounter: Payer: Self-pay | Admitting: Internal Medicine

## 2020-11-19 ENCOUNTER — Other Ambulatory Visit: Payer: Self-pay

## 2020-11-19 ENCOUNTER — Inpatient Hospital Stay: Payer: Medicare HMO | Admitting: Internal Medicine

## 2020-11-19 VITALS — BP 128/68 | HR 50 | Temp 98.1°F | Resp 18 | Ht 64.0 in | Wt 143.7 lb

## 2020-11-19 DIAGNOSIS — C3491 Malignant neoplasm of unspecified part of right bronchus or lung: Secondary | ICD-10-CM

## 2020-11-19 DIAGNOSIS — Z5111 Encounter for antineoplastic chemotherapy: Secondary | ICD-10-CM

## 2020-11-19 DIAGNOSIS — F17200 Nicotine dependence, unspecified, uncomplicated: Secondary | ICD-10-CM

## 2020-11-19 LAB — CMP (CANCER CENTER ONLY)
ALT: 12 U/L (ref 0–44)
AST: 12 U/L — ABNORMAL LOW (ref 15–41)
Albumin: 3.4 g/dL — ABNORMAL LOW (ref 3.5–5.0)
Alkaline Phosphatase: 77 U/L (ref 38–126)
Anion gap: 7 (ref 5–15)
BUN: 9 mg/dL (ref 8–23)
CO2: 23 mmol/L (ref 22–32)
Calcium: 9 mg/dL (ref 8.9–10.3)
Chloride: 113 mmol/L — ABNORMAL HIGH (ref 98–111)
Creatinine: 0.76 mg/dL (ref 0.44–1.00)
GFR, Estimated: >60 mL/min (ref >60)
Glucose, Bld: 138 mg/dL — ABNORMAL HIGH (ref 70–99)
Potassium: 4.1 mmol/L (ref 3.5–5.1)
Sodium: 143 mmol/L (ref 135–145)
Total Bilirubin: <0.2 mg/dL — ABNORMAL LOW (ref 0.3–1.2)
Total Protein: 6.6 g/dL (ref 6.5–8.1)

## 2020-11-19 LAB — CBC WITH DIFFERENTIAL (CANCER CENTER ONLY)
Abs Immature Granulocytes: 0.01 10*3/uL (ref 0.00–0.07)
Basophils Absolute: 0 10*3/uL (ref 0.0–0.1)
Basophils Relative: 0 %
Eosinophils Absolute: 0 10*3/uL (ref 0.0–0.5)
Eosinophils Relative: 0 %
HCT: 31.8 % — ABNORMAL LOW (ref 36.0–46.0)
Hemoglobin: 10.3 g/dL — ABNORMAL LOW (ref 12.0–15.0)
Immature Granulocytes: 0 %
Lymphocytes Relative: 33 %
Lymphs Abs: 0.8 10*3/uL (ref 0.7–4.0)
MCH: 30.9 pg (ref 26.0–34.0)
MCHC: 32.4 g/dL (ref 30.0–36.0)
MCV: 95.5 fL (ref 80.0–100.0)
Monocytes Absolute: 0.4 10*3/uL (ref 0.1–1.0)
Monocytes Relative: 16 %
Neutro Abs: 1.2 10*3/uL — ABNORMAL LOW (ref 1.7–7.7)
Neutrophils Relative %: 51 %
Platelet Count: 351 10*3/uL (ref 150–400)
RBC: 3.33 MIL/uL — ABNORMAL LOW (ref 3.87–5.11)
RDW: 12 % (ref 11.5–15.5)
WBC Count: 2.4 10*3/uL — ABNORMAL LOW (ref 4.0–10.5)
nRBC: 0 % (ref 0.0–0.2)

## 2020-11-19 LAB — MAGNESIUM: Magnesium: 1.7 mg/dL (ref 1.7–2.4)

## 2020-11-19 MED ORDER — SODIUM CHLORIDE 0.9 % IV SOLN
75.0000 mg/m2 | Freq: Once | INTRAVENOUS | Status: AC
  Administered 2020-11-19: 127 mg via INTRAVENOUS
  Filled 2020-11-19: qty 127

## 2020-11-19 MED ORDER — MAGNESIUM SULFATE 2 GM/50ML IV SOLN
INTRAVENOUS | Status: AC
  Filled 2020-11-19: qty 50

## 2020-11-19 MED ORDER — SODIUM CHLORIDE 0.9 % IV SOLN
150.0000 mg | Freq: Once | INTRAVENOUS | Status: AC
  Administered 2020-11-19: 150 mg via INTRAVENOUS
  Filled 2020-11-19: qty 150

## 2020-11-19 MED ORDER — MAGNESIUM SULFATE 2 GM/50ML IV SOLN
2.0000 g | Freq: Once | INTRAVENOUS | Status: AC
  Administered 2020-11-19: 2 g via INTRAVENOUS

## 2020-11-19 MED ORDER — PALONOSETRON HCL INJECTION 0.25 MG/5ML
0.2500 mg | Freq: Once | INTRAVENOUS | Status: AC
  Administered 2020-11-19: 0.25 mg via INTRAVENOUS

## 2020-11-19 MED ORDER — SODIUM CHLORIDE 0.9 % IV SOLN
Freq: Once | INTRAVENOUS | Status: AC
  Filled 2020-11-19: qty 250

## 2020-11-19 MED ORDER — SODIUM CHLORIDE 0.9 % IV SOLN
500.0000 mg/m2 | Freq: Once | INTRAVENOUS | Status: AC
  Administered 2020-11-19: 850 mg via INTRAVENOUS
  Filled 2020-11-19: qty 20

## 2020-11-19 MED ORDER — SODIUM CHLORIDE 0.9 % IV SOLN
10.0000 mg | Freq: Once | INTRAVENOUS | Status: AC
  Administered 2020-11-19: 10 mg via INTRAVENOUS
  Filled 2020-11-19: qty 10

## 2020-11-19 MED ORDER — POTASSIUM CHLORIDE IN NACL 20-0.9 MEQ/L-% IV SOLN
Freq: Once | INTRAVENOUS | Status: AC
  Filled 2020-11-19: qty 1000

## 2020-11-19 MED ORDER — PALONOSETRON HCL INJECTION 0.25 MG/5ML: 0.2500 mg | Freq: Once | INTRAVENOUS | Status: DC

## 2020-11-19 MED ORDER — PALONOSETRON HCL INJECTION 0.25 MG/5ML
INTRAVENOUS | Status: AC
  Filled 2020-11-19: qty 5

## 2020-11-19 NOTE — Progress Notes (Signed)
Per Dr Leander Rams is okay to treat pt today with Cisplatin and Alimta and todays WBC 2.4and ANC 1.2.

## 2020-11-19 NOTE — Progress Notes (Signed)
[place] [phone]   Fax:([phone] [place], [place] [address]  DIAGNOSIS: Stage IIB (T3, N0, M0) non-small cell lung cancer, adenocarcinoma involving the right upper lobe. The patient also has evidence for adenocarcinoma of the left upper lobe lung nodule as well as subcentimeter pulmonary nodules in the right lung concerning for stage IV lung cancer with multifocal disease bilaterally.  Biomarker Findings  Microsatellite status - MS-Equivocal ? Tumor Mutational Burden - Cannot Be Determined Genomic Findings For a complete list of the genes assayed, please refer to the Appendix. MET [id number]*2 AXL T343M TP53 I254V 7 Disease relevant genes with no reportable alterations: ALK, BRAF, EGFR, ERBB2, KRAS, RET, ROS1  PDL1 Expression 20 %.  PRIOR THERAPY: status post right upper lobectomy with lymph node sampling on August 26, 2020 under the care of Dr. Kipp Brood.  CURRENT THERAPY: Adjuvant systemic chemotherapy with cisplatin 75 mg/M2 and Alimta 550/M2 every 3 weeks. First dose October 22, 2020.  Status post 1 cycle.  INTERVAL HISTORY: Tonya [name] 75 y.o. female returns to the clinic today for follow-up visit accompanied by her husband.  The patient is feeling fine today with no concerning complaints except for fatigue.  She tolerated the first cycle of her treatment except for several days of fatigue after the treatment in addition to delayed nausea.  She denied having any current chest pain, shortness of breath, cough or hemoptysis.  She denied having any fever or chills.  She has no nausea, vomiting, diarrhea or constipation.  She has no headache or visual changes.  She is here today for evaluation before starting cycle #2.  MEDICAL HISTORY: Past Medical History:  Diagnosis Date  . Adenoma of left adrenal gland   . Cancer ([place])    Lung  . Colon polyps    hyperplastic  .  Complication of anesthesia    Very emotional and cries after anesthesia  . GERD (gastroesophageal reflux disease)   . Kidney stone   . Liver lesion   . Microhematuria     ALLERGIES:  is allergic to penicillins.  MEDICATIONS:  Current Outpatient Medications  Medication Sig Dispense Refill  . acetaminophen (TYLENOL) 500 MG tablet Take 2 tablets (1,000 mg total) by mouth every 6 (six) hours as needed. 30 tablet 0  . albuterol (VENTOLIN HFA) 108 (90 Base) MCG/ACT inhaler Inhale 1-2 puffs into the lungs every 4 (four) hours as needed for wheezing or shortness of breath.    Marland Kitchen alendronate (FOSAMAX) 70 MG tablet Take 70 mg by mouth every Saturday.    . ALPRAZolam (XANAX) 1 MG tablet Take 1 mg by mouth 3 (three) times daily as needed for anxiety or sleep.    . Calcium Carbonate-Vitamin D (CALCIUM + D PO) Take 1 tablet by mouth in the morning and at bedtime.    . cyclobenzaprine (FLEXERIL) 5 MG tablet Take 5 mg by mouth 3 (three) times daily as needed for muscle spasms (sciatic nerve).     Marland Kitchen dexamethasone (DECADRON) 2 MG tablet Take 4 mg by mouth See admin instructions. Take 2 tablets by mouth twice daily the day before, day of and day after the chemotherapy every 3 weeks.    . folic acid (FOLVITE) 1 MG tablet Take 1 tablet (1 mg total) by mouth daily. (Patient taking differently: Take 1 mg by mouth daily at 12 noon.) 30 tablet 4  . guaiFENesin (MUCINEX) 600 MG 12  hr tablet Take 2 tablets (1,200 mg total) by mouth 2 (two) times daily as needed. (Patient taking differently: Take 1,200 mg by mouth 2 (two) times daily as needed for cough.)    . ibuprofen (ADVIL) 800 MG tablet Take 1 tablet (800 mg total) by mouth every 8 (eight) hours as needed for moderate pain. 30 tablet 0  . Lidocaine 4 % PTCH Place 1 patch onto the skin daily as needed (pain).    . naproxen (NAPROSYN) 375 MG tablet Take 1 tablet (375 mg total) by mouth 2 (two) times daily as needed for moderate pain. 8 tablet 0  . omeprazole  (PRILOSEC) 40 MG capsule Take 40 mg by mouth 2 (two) times daily.    Marland Kitchen oxyCODONE-acetaminophen (PERCOCET) 10-325 MG tablet Take 1 tablet by mouth every 4 (four) hours as needed for moderate pain.     . pregabalin (LYRICA) 25 MG capsule Take 1 capsule (25 mg total) by mouth 2 (two) times daily. 60 capsule 0  . prochlorperazine (COMPAZINE) 10 MG tablet Take 1 tablet (10 mg total) by mouth every 6 (six) hours as needed for nausea or vomiting. 30 tablet 0  . rosuvastatin (CRESTOR) 10 MG tablet Take 10 mg by mouth at bedtime.    . Tiotropium Bromide-Olodaterol (STIOLTO RESPIMAT) 2.5-2.5 MCG/ACT AERS Inhale 2 puffs into the lungs daily. (Patient taking differently: Inhale 2 puffs into the lungs daily as needed (respiratory issues).) 4 g 0   No current facility-administered medications for this visit.    SURGICAL HISTORY:  Past Surgical History:  Procedure Laterality Date  . ABDOMINAL HYSTERECTOMY    . APPENDECTOMY    . BRONCHIAL BIOPSY  07/18/2020   Procedure: BRONCHIAL BIOPSIES;  Surgeon: [name], DO;  Location: [facility];  Service: Pulmonary;;  . BRONCHIAL BRUSHINGS  07/18/2020   Procedure: BRONCHIAL BRUSHINGS;  Surgeon: [name], DO;  Location: [facility];  Service: Pulmonary;;  . BRONCHIAL NEEDLE ASPIRATION BIOPSY  07/18/2020   Procedure: BRONCHIAL NEEDLE ASPIRATION BIOPSIES;  Surgeon: [name], DO;  Location: [facility];  Service: Pulmonary;;  . BRONCHIAL WASHINGS  07/18/2020   Procedure: BRONCHIAL WASHINGS;  Surgeon: [name], DO;  Location: [facility];  Service: Pulmonary;;  . FIDUCIAL MARKER PLACEMENT  07/18/2020   Procedure: FIDUCIAL MARKER PLACEMENT;  Surgeon: [name], DO;  Location: [facility];  Service: Pulmonary;;  . INTERCOSTAL NERVE BLOCK Right 08/26/2020   Procedure: INTERCOSTAL NERVE BLOCK;  Surgeon: [name], MD;  Location: [facility];  Service: Thoracic;  Laterality: Right;  . IR THORACENTESIS ASP PLEURAL SPACE W/IMG  GUIDE  10/10/2020  . NODE DISSECTION Right 08/26/2020   Procedure: NODE DISSECTION;  Surgeon: [name], MD;  Location: [facility];  Service: Thoracic;  Laterality: Right;  . THORACENTESIS Right 11/04/2020   Procedure: THORACENTESIS;  Surgeon: [name], DO;  Location: [facility];  Service: Pulmonary;  Laterality: Right;  Marland Kitchen VIDEO BRONCHOSCOPY WITH ENDOBRONCHIAL NAVIGATION N/A 07/18/2020   Procedure: VIDEO BRONCHOSCOPY WITH ENDOBRONCHIAL NAVIGATION;  Surgeon: [name], DO;  Location: [facility];  Service: Pulmonary;  Laterality: N/A;  . VIDEO BRONCHOSCOPY WITH ENDOBRONCHIAL ULTRASOUND  07/18/2020   Procedure: VIDEO BRONCHOSCOPY WITH ENDOBRONCHIAL ULTRASOUND;  Surgeon: [name], DO;  Location: [facility];  Service: Pulmonary;;    REVIEW OF SYSTEMS:  A comprehensive review of systems was negative except for: Constitutional: positive for fatigue   PHYSICAL EXAMINATION: General appearance: alert, cooperative, fatigued and no distress Head: Normocephalic, without obvious abnormality,  atraumatic Neck: no adenopathy, no JVD, supple, symmetrical, trachea midline and thyroid not enlarged, symmetric, no tenderness/mass/nodules Lymph nodes: Cervical, supraclavicular, and axillary nodes normal. Resp: clear to auscultation bilaterally Back: symmetric, no curvature. ROM normal. No CVA tenderness. Cardio: regular rate and rhythm, S1, S2 normal, no murmur, click, rub or gallop GI: soft, non-tender; bowel sounds normal; no masses,  no organomegaly Extremities: extremities normal, atraumatic, no cyanosis or edema  ECOG PERFORMANCE STATUS: 1 - Symptomatic but completely ambulatory  Blood pressure 128/68, pulse (!) 50, temperature 98.1 F (36.7 C), temperature source Tympanic, resp. rate 18, height $RemoveBe'5\' 4"'gNlOiuAhn$  (1.626 m), weight 143 lb 11.2 oz (65.2 kg), SpO2 100 %.  LABORATORY DATA: Lab Results  Component Value Date   WBC 2.4 (L) 11/19/2020   HGB 10.3 (L) 11/19/2020   HCT 31.8  (L) 11/19/2020   MCV 95.5 11/19/2020   PLT 351 11/19/2020      Chemistry      Component Value Date/Time   NA 143 11/19/2020 0818   K 4.1 11/19/2020 0818   CL 113 (H) 11/19/2020 0818   CO2 23 11/19/2020 0818   BUN 9 11/19/2020 0818   CREATININE 0.76 11/19/2020 0818      Component Value Date/Time   CALCIUM 9.0 11/19/2020 0818   ALKPHOS 77 11/19/2020 0818   AST 12 (L) 11/19/2020 0818   ALT 12 11/19/2020 0818   BILITOT <0.2 (L) 11/19/2020 0818       RADIOGRAPHIC STUDIES: DG Chest 1 View  Result Date: 11/04/2020 CLINICAL DATA:  Post thoracentesis EXAM: CHEST  1 VIEW COMPARISON:  11/01/2020 FINDINGS: Improvement in right pleural effusion with minimal residual effusion. No pneumothorax. Surgical staples in the right lung base. Left lung is clear.  Surgical staples in the left upper lobe. Thoracic scoliosis. Negative for heart failure. Atherosclerotic calcification aorta. IMPRESSION: No complication post right thoracentesis. Electronically Signed   By: Franchot Gallo M.D.   On: 11/04/2020 16:18   DG Chest 2 View  Result Date: 11/01/2020 CLINICAL DATA:  Adenocarcinoma right lung stage II EXAM: CHEST - 2 VIEW COMPARISON:  October 17, 2020 FINDINGS: There is persistent elevation of the right hemidiaphragm. There is a degree of pleural effusion on the right. There is stable opacity in the right perihilar region. Left lung is clear. Heart size is normal. No adenopathy is appreciable by radiography. There is calcification in the lateral right shoulder, likely representing calcific tendinosis. IMPRESSION: New right pleural effusion. Persistent elevation right hemidiaphragm. Stable airspace opacity in the right hilum which potentially may represent atelectasis. A well-defined mass is not seen by radiography. Stable cardiac silhouette. Left lung clear. Suspect calcific tendinosis lateral right shoulder. Electronically Signed   By: Lowella Grip III M.D.   On: 11/01/2020 11:00    ASSESSMENT AND  PLAN: This is a very pleasant 75 years old African-American female recently diagnosed with a stage IIb (T3, N0, M0) non-small cell lung cancer, adenocarcinoma involving the right upper lobe with 2 nodules. The patient also has evidence for adenocarcinoma of the left upper lobe and subcentimeter nodules in the right lung concerning for stage IV with multifocal disease bilaterally. She is status post right upper lobectomy with lymph node dissection on August 26, 2020 under the care of Dr. Kipp Brood. Her molecular studies by foundation 1 showed no actionable mutations. She has PD-L1 expression of 20%. She is currently undergoing adjuvant treatment with systemic chemotherapy with cisplatin 75 mg/M2 and Alimta 500 mg/M2 every 3 weeks status post 1 cycle.  The  patient tolerated the first cycle of her treatment well except for fatigue for few days after the treatment and delayed nausea. I recommended for her to proceed with cycle #2 today as planned. I recommended for her to extend the Decadron for 3-4 more days after the treatment. We will continue to monitor her white blood count closely with the weekly lab and consider the patient for treatment with Granix if needed. The patient voices understanding of current disease status and treatment options and is in agreement with the current care plan.  All questions were answered. The patient knows to call the clinic with any problems, questions or concerns. We can certainly see the patient much sooner if necessary.  Disclaimer: This note was dictated with voice recognition software. Similar sounding words can inadvertently be transcribed and may not be corrected upon review.

## 2020-11-19 NOTE — Patient Instructions (Signed)
[facility] Discharge Instructions for Patients Receiving Chemotherapy  Today you received the following chemotherapy agents Cisplatin; Alimta  To help prevent nausea and vomiting after your treatment, we encourage you to take your nausea medication as directed   If you develop nausea and vomiting that is not controlled by your nausea medication, call the clinic.   BELOW ARE SYMPTOMS THAT SHOULD BE REPORTED IMMEDIATELY:  *FEVER GREATER THAN 100.5 F  *CHILLS WITH OR WITHOUT FEVER  NAUSEA AND VOMITING THAT IS NOT CONTROLLED WITH YOUR NAUSEA MEDICATION  *UNUSUAL SHORTNESS OF BREATH  *UNUSUAL BRUISING OR BLEEDING  TENDERNESS IN MOUTH AND THROAT WITH OR WITHOUT PRESENCE OF ULCERS  *URINARY PROBLEMS  *BOWEL PROBLEMS  UNUSUAL RASH Items with * indicate a potential emergency and should be followed up as soon as possible.  Feel free to call the clinic should you have any questions or concerns. The clinic phone number is [phone].  Please show the [facility] at check-in to the Emergency Department and triage nurse.

## 2020-11-21 ENCOUNTER — Telehealth: Payer: Self-pay | Admitting: Internal Medicine

## 2020-11-21 NOTE — Telephone Encounter (Signed)
Scheduled per los. Called, not able tot leave msg. Mailed printout

## 2020-11-26 ENCOUNTER — Other Ambulatory Visit: Payer: Self-pay | Admitting: Internal Medicine

## 2020-11-26 ENCOUNTER — Telehealth: Payer: Self-pay | Admitting: Pulmonary Disease

## 2020-11-26 ENCOUNTER — Encounter: Payer: Self-pay | Admitting: Primary Care

## 2020-11-26 ENCOUNTER — Inpatient Hospital Stay: Payer: Medicare HMO

## 2020-11-26 ENCOUNTER — Ambulatory Visit (INDEPENDENT_AMBULATORY_CARE_PROVIDER_SITE_OTHER): Payer: Medicare HMO

## 2020-11-26 ENCOUNTER — Other Ambulatory Visit: Payer: Self-pay

## 2020-11-26 ENCOUNTER — Ambulatory Visit: Payer: Medicare HMO | Admitting: Primary Care

## 2020-11-26 DIAGNOSIS — C3491 Malignant neoplasm of unspecified part of right bronchus or lung: Secondary | ICD-10-CM

## 2020-11-26 DIAGNOSIS — Z5111 Encounter for antineoplastic chemotherapy: Secondary | ICD-10-CM | POA: Diagnosis not present

## 2020-11-26 DIAGNOSIS — J90 Pleural effusion, not elsewhere classified: Secondary | ICD-10-CM

## 2020-11-26 LAB — CBC WITH DIFFERENTIAL (CANCER CENTER ONLY)
Abs Immature Granulocytes: 0.01 10*3/uL (ref 0.00–0.07)
Basophils Absolute: 0 10*3/uL (ref 0.0–0.1)
Basophils Relative: 0 %
Eosinophils Absolute: 0 10*3/uL (ref 0.0–0.5)
Eosinophils Relative: 1 %
HCT: 33.4 % — ABNORMAL LOW (ref 36.0–46.0)
Hemoglobin: 11.1 g/dL — ABNORMAL LOW (ref 12.0–15.0)
Immature Granulocytes: 0 %
Lymphocytes Relative: 40 %
Lymphs Abs: 1.5 10*3/uL (ref 0.7–4.0)
MCH: 30.8 pg (ref 26.0–34.0)
MCHC: 33.2 g/dL (ref 30.0–36.0)
MCV: 92.8 fL (ref 80.0–100.0)
Monocytes Absolute: 0.3 10*3/uL (ref 0.1–1.0)
Monocytes Relative: 7 %
Neutro Abs: 1.9 10*3/uL (ref 1.7–7.7)
Neutrophils Relative %: 52 %
Platelet Count: 179 10*3/uL (ref 150–400)
RBC: 3.6 MIL/uL — ABNORMAL LOW (ref 3.87–5.11)
RDW: 12 % (ref 11.5–15.5)
WBC Count: 3.7 10*3/uL — ABNORMAL LOW (ref 4.0–10.5)
nRBC: 0 % (ref 0.0–0.2)

## 2020-11-26 LAB — CMP (CANCER CENTER ONLY)
ALT: 10 U/L (ref 0–44)
AST: 10 U/L — ABNORMAL LOW (ref 15–41)
Albumin: 3.3 g/dL — ABNORMAL LOW (ref 3.5–5.0)
Alkaline Phosphatase: 68 U/L (ref 38–126)
Anion gap: 10 (ref 5–15)
BUN: 11 mg/dL (ref 8–23)
CO2: 28 mmol/L (ref 22–32)
Calcium: 8.3 mg/dL — ABNORMAL LOW (ref 8.9–10.3)
Chloride: 104 mmol/L (ref 98–111)
Creatinine: 0.78 mg/dL (ref 0.44–1.00)
GFR, Estimated: >60 mL/min (ref >60)
Glucose, Bld: 94 mg/dL (ref 70–99)
Potassium: 3.7 mmol/L (ref 3.5–5.1)
Sodium: 142 mmol/L (ref 135–145)
Total Bilirubin: 0.6 mg/dL (ref 0.3–1.2)
Total Protein: 6.2 g/dL — ABNORMAL LOW (ref 6.5–8.1)

## 2020-11-26 LAB — MAGNESIUM: Magnesium: 1.1 mg/dL — ABNORMAL LOW (ref 1.7–2.4)

## 2020-11-26 MED ORDER — MAGNESIUM OXIDE 400 (241.3 MG) MG PO TABS: 400.0000 mg | ORAL_TABLET | Freq: Two times a day (BID) | ORAL | 1 refills | Status: DC

## 2020-11-26 NOTE — Telephone Encounter (Signed)
I have called the pt and she is aware of appt with TP today at 1130.  Pt is aware to arrive early for cxr.  Pt voiced her understanding.

## 2020-11-26 NOTE — Assessment & Plan Note (Addendum)
-   Increased shortness of breath and right sided chest discomfort x 4-5 days - CXR today showed moderate sized right pleural effusion - I will reach out to Dr. Valeta Harms and we will set up date/time for thoracentesis. Previous pleural fluid sample did not have malignant cells, felt to be post-op related. Unclear if she would benefit from indwelling pleural catheter.  - Follow-up Virtual visit with Dr. Kipp Brood Friday and April 4th with Dr. Valeta Harms

## 2020-11-26 NOTE — Telephone Encounter (Signed)
CXR today showed re-accumulation of right pleural fluid, moderate sized. Do you want to schedule thoracentesis? Would she benefit from pleurx?

## 2020-11-26 NOTE — H&P (View-Only) (Signed)
@Patient  ID: Tonya [name], female    DOB: 1946-05-16, 75 y.o.   MRN: [id number]  Chief Complaint  Patient presents with  . Follow-up     R side lung pain- started on thursday    Referring provider: Lin Landsman, MD  HPI: 75 year old female, former smoker quit in 2001 (18-pack-year history).  Past medical history significant for recurrent right pleural effusion, added no carcinoma of right lung, mediastinal adenopathy, GERD, hyperlipidemia.  Patient of Dr. Valeta Harms, last seen on 10/17/2020.  Patient underwent thoracentesis on 10/17/20 and 11/04/2020 for right-sided pleural effusion with Dr. Valeta Harms. Cytology from pleural fluid did not show malignancy.  Predominantly lymphocytic which can be seen postoperatively. Following with Dr. Mohammed/Dr. Kipp Brood.   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 Patient last saw Dr. Earlie Server on 11/19/2020 and received chemotherapy (Cisplatin and Alimata).   Patient called our office today with reports of increased fatigue, shortness of breath and pain on right side. Pain stated Thursday 11/21/20 after having chemotherapy two days earlier. Since her second round of chemo she has been struggling with nausea. She has been in bed pretty much all week.  She feels as though fluid in right lung has reaccumulated. CXR today showed moderate right sided pleural effusion. She has a virtual visit with Dr. Kipp Brood at the end of this week. Denies f/c/s or hemoptysis.   Imaging: 10/17/20 CXR- No pneumothorax status post thoracentesis. Ill-defined opacity at the right lung base likely scarring although infectious/inflammatory etiology not excluded.  11/01/20 CXR- New right pleural effusion. Persistent elevation right hemidiaphragm. Stable airspace opacity in the  right hilum which potentially may represent atelectasis. A well-defined mass is not seen by radiography.    11/04/20 CXR-  Improvement in right pleural effusion with minimal residual effusion. No pneumothorax.  Allergies  Allergen Reactions  . Other Anaphylaxis    Spiders  . Penicillins Hives    REACTION: 20 years    Immunization History  Administered Date(s) Administered  . Influenza, High Dose Seasonal PF 07/17/2020  . PFIZER(Purple Top)SARS-COV-2 Vaccination 11/09/2019, 12/05/2019, 07/17/2020  . Pneumococcal Polysaccharide-23 07/31/2017    Past Medical History:  Diagnosis Date  . Adenoma of left adrenal gland   . Cancer ([place])    Lung  . Colon polyps    hyperplastic  . Complication of anesthesia    Very emotional and cries after anesthesia  . GERD (gastroesophageal reflux disease)   . Kidney stone   . Liver lesion   . Microhematuria     Tobacco History: Social History   Tobacco Use  Smoking Status Former Smoker  . Packs/day: 0.30  . Years: 60.00  . Pack years: 18.00  . Types: Cigarettes  . Quit date: 06/11/2020  . Years since quitting: 0.4  Smokeless Tobacco Never Used   Counseling given: Not Answered   Outpatient Medications Prior to Visit  Medication Sig Dispense Refill  . acetaminophen (TYLENOL) 500 MG tablet Take 2 tablets (1,000 mg total) by mouth every 6 (six) hours as needed. 30 tablet 0  . albuterol (VENTOLIN HFA) 108 (90 Base) MCG/ACT inhaler Inhale 1-2 puffs into the lungs every 4 (four) hours as needed for wheezing or shortness of breath.    Marland Kitchen alendronate (FOSAMAX) 70 MG tablet Take 70 mg by mouth every Saturday.    . ALPRAZolam Duanne Moron) 1  MG tablet Take 1 mg by mouth 3 (three) times daily as needed for anxiety or sleep.    . Calcium Carbonate-Vitamin D (CALCIUM + D PO) Take 1 tablet by mouth in the morning and at bedtime.    . cyclobenzaprine (FLEXERIL) 5 MG tablet Take 5 mg by mouth 3 (three) times daily as needed for muscle spasms (sciatic  nerve).     Marland Kitchen dexamethasone (DECADRON) 2 MG tablet Take 4 mg by mouth See admin instructions. Take 2 tablets by mouth twice daily the day before, day of and day after the chemotherapy every 3 weeks.    . folic acid (FOLVITE) 1 MG tablet Take 1 tablet (1 mg total) by mouth daily. (Patient taking differently: Take 1 mg by mouth daily at 12 noon.) 30 tablet 4  . guaiFENesin (MUCINEX) 600 MG 12 hr tablet Take 2 tablets (1,200 mg total) by mouth 2 (two) times daily as needed. (Patient taking differently: Take 1,200 mg by mouth 2 (two) times daily as needed for cough.)    . ibuprofen (ADVIL) 800 MG tablet Take 1 tablet (800 mg total) by mouth every 8 (eight) hours as needed for moderate pain. 30 tablet 0  . Lidocaine 4 % PTCH Place 1 patch onto the skin daily as needed (pain).    . magnesium oxide (MAG-OX) 400 (241.3 Mg) MG tablet Take 1 tablet (400 mg total) by mouth 2 (two) times daily. 60 tablet 1  . naproxen (NAPROSYN) 375 MG tablet Take 1 tablet (375 mg total) by mouth 2 (two) times daily as needed for moderate pain. 8 tablet 0  . omeprazole (PRILOSEC) 40 MG capsule Take 40 mg by mouth 2 (two) times daily.    Marland Kitchen oxyCODONE-acetaminophen (PERCOCET) 10-325 MG tablet Take 1 tablet by mouth every 4 (four) hours as needed for moderate pain.     . pregabalin (LYRICA) 25 MG capsule Take 1 capsule (25 mg total) by mouth 2 (two) times daily. 60 capsule 0  . prochlorperazine (COMPAZINE) 10 MG tablet Take 1 tablet (10 mg total) by mouth every 6 (six) hours as needed for nausea or vomiting. 30 tablet 0  . rosuvastatin (CRESTOR) 10 MG tablet Take 10 mg by mouth at bedtime.    . Tiotropium Bromide-Olodaterol (STIOLTO RESPIMAT) 2.5-2.5 MCG/ACT AERS Inhale 2 puffs into the lungs daily. (Patient taking differently: Inhale 2 puffs into the lungs daily as needed (respiratory issues).) 4 g 0   No facility-administered medications prior to visit.   Review of Systems  Review of Systems  Constitutional: Positive for  appetite change and fatigue. Negative for chills and fever.  HENT: Negative.   Respiratory: Positive for shortness of breath. Negative for chest tightness and wheezing.   Cardiovascular:       Pleuritic chest pain   Physical Exam  BP 120/82 (BP Location: Left Arm, Cuff Size: Normal)   Pulse 75   Temp (!) 97 F (36.1 C)   Ht 5\' 4"  (1.626 m)   Wt 139 lb (63 kg)   SpO2 100%   BMI 23.86 kg/m  Physical Exam Constitutional:      Appearance: Normal appearance.     Comments: Appears fatigued  HENT:     Mouth/Throat:     Comments: Deferred d/t masking Cardiovascular:     Rate and Rhythm: Normal rate and regular rhythm.  Pulmonary:     Effort: Pulmonary effort is normal.     Breath sounds: No wheezing, rhonchi or rales.     Comments: Mostly  clear, moving good air bilaterally  Musculoskeletal:        General: Normal range of motion.  Skin:    General: Skin is warm and dry.  Neurological:     General: No focal deficit present.     Mental Status: She is alert and oriented to person, place, and time. Mental status is at baseline.  Psychiatric:        Mood and Affect: Mood normal.        Behavior: Behavior normal.        Thought Content: Thought content normal.        Judgment: Judgment normal.      Lab Results:  CBC    Component Value Date/Time   WBC 3.7 (L) 11/26/2020 0915   WBC 10.8 (H) 08/28/2020 0140   RBC 3.60 (L) 11/26/2020 0915   HGB 11.1 (L) 11/26/2020 0915   HCT 33.4 (L) 11/26/2020 0915   PLT 179 11/26/2020 0915   MCV 92.8 11/26/2020 0915   MCH 30.8 11/26/2020 0915   MCHC 33.2 11/26/2020 0915   RDW 12.0 11/26/2020 0915   LYMPHSABS 1.5 11/26/2020 0915   MONOABS 0.3 11/26/2020 0915   EOSABS 0.0 11/26/2020 0915   BASOSABS 0.0 11/26/2020 0915    BMET    Component Value Date/Time   NA 142 11/26/2020 0915   K 3.7 11/26/2020 0915   CL 104 11/26/2020 0915   CO2 28 11/26/2020 0915   GLUCOSE 94 11/26/2020 0915   BUN 11 11/26/2020 0915   CREATININE 0.78  11/26/2020 0915   CALCIUM 8.3 (L) 11/26/2020 0915   GFRNONAA >60 11/26/2020 0915   GFRAA >60 04/01/2020 1706    BNP No results found for: BNP  ProBNP No results found for: PROBNP  Imaging: DG Chest 1 View  Result Date: 11/04/2020 CLINICAL DATA:  Post thoracentesis EXAM: CHEST  1 VIEW COMPARISON:  11/01/2020 FINDINGS: Improvement in right pleural effusion with minimal residual effusion. No pneumothorax. Surgical staples in the right lung base. Left lung is clear.  Surgical staples in the left upper lobe. Thoracic scoliosis. Negative for heart failure. Atherosclerotic calcification aorta. IMPRESSION: No complication post right thoracentesis. Electronically Signed   By: Franchot Gallo M.D.   On: 11/04/2020 16:18   DG Chest 2 View  Result Date: 11/26/2020 CLINICAL DATA:  History of pleural effusion. Adenocarcinoma of the right lung EXAM: CHEST - 2 VIEW COMPARISON:  11/01/2020, 11/04/2020 FINDINGS: Reaccumulation of right-sided pleural effusion, now moderate-sized. Persistent right perihilar opacity. Mild streaky left basilar opacity. Heart size within normal limits. No left-sided pleural effusion. No pneumothorax. IMPRESSION: 1. Reaccumulation of right-sided pleural effusion, now moderate-sized. 2. Persistent right perihilar opacity. 3. Probable mild left basilar atelectasis. Electronically Signed   By: Davina Poke D.O.   On: 11/26/2020 11:49   DG Chest 2 View  Result Date: 11/01/2020 CLINICAL DATA:  Adenocarcinoma right lung stage II EXAM: CHEST - 2 VIEW COMPARISON:  October 17, 2020 FINDINGS: There is persistent elevation of the right hemidiaphragm. There is a degree of pleural effusion on the right. There is stable opacity in the right perihilar region. Left lung is clear. Heart size is normal. No adenopathy is appreciable by radiography. There is calcification in the lateral right shoulder, likely representing calcific tendinosis. IMPRESSION: New right pleural effusion. Persistent  elevation right hemidiaphragm. Stable airspace opacity in the right hilum which potentially may represent atelectasis. A well-defined mass is not seen by radiography. Stable cardiac silhouette. Left lung clear. Suspect calcific tendinosis lateral right shoulder.  Electronically Signed   By: Lowella Grip III M.D.   On: 11/01/2020 11:00     Assessment & Plan:   Pleural effusion, right - Increased shortness of breath and right sided chest discomfort x 4-5 days - CXR today showed moderate sized right pleural effusion - I will reach out to Dr. Valeta Harms and we will set up date/time for thoracentesis. Previous pleural fluid sample did not have malignant cells, felt to be post-op related. Unclear if she would benefit from indwelling pleural catheter.  - Follow-up Virtual visit with Dr. Kipp Brood Friday and April 4th with Dr. Marthe Patch, NP 11/26/2020

## 2020-11-26 NOTE — Telephone Encounter (Signed)
Ok, keep me in the loop. Ill send you my note

## 2020-11-26 NOTE — Patient Instructions (Addendum)
CXR showed re-accumulation righht pleural effusion, moderate size  I will reach out to Dr. Valeta Harms and we will set up date/time for thoracentesis  Below I gave you information about pleural effusions, thoracentesis and potential indwelling pleural catheter   Follow-up: Virtual visit with Dr. Kipp Brood Friday Dr. Valeta Harms scheduled for April 4th    Pleural Effusion Pleural effusion is an abnormal buildup of fluid in the layers of tissue between the lungs and the inside of the chest (pleural space). The two layers of tissue that line the lungs and the inside of the chest are called pleura. Usually, there is no air in the space between the pleura, only a thin layer of fluid. Some conditions can cause a large amount of fluid to build up, which can cause the lung to collapse if untreated. A pleural effusion is usually caused by another disease that requires treatment. What are the causes? Pleural effusion can be caused by:  Heart failure.  Certain infections, such as pneumonia or tuberculosis.  Cancer.  A blood clot in the lung (pulmonary embolism).  Complications from surgery, such as from open heart surgery.  Liver disease (cirrhosis).  Kidney disease. What are the signs or symptoms? In some cases, pleural effusion may cause no symptoms. If symptoms are present, they may include:  Shortness of breath, especially when lying down.  Chest pain. This may get worse when taking a deep breath.  Fever.  Dry, long-lasting (chronic) cough.  Hiccups.  Rapid breathing. An underlying condition that is causing the pleural effusion (such as heart failure, pneumonia, blood clots, tuberculosis, or cancer) may also cause other symptoms. How is this diagnosed? This condition may be diagnosed based on:  Your symptoms and medical history.  A physical exam.  A chest X-ray.  A procedure to use a needle to remove fluid from the pleural space (thoracentesis). This fluid is tested.  Other  imaging studies of the chest, such as ultrasound or CT scan. How is this treated? Depending on the cause of your condition, treatment may include:  Treating the underlying condition that is causing the effusion. When that condition improves, the effusion will also improve. Examples of treatment for underlying conditions include: ? Antibiotic medicines to treat an infection. ? Diuretics or other heart medicines to treat heart failure.  Thoracentesis.  Placing a thin flexible tube under your skin and into your chest to continuously drain the effusion (indwelling pleural catheter).  Surgery to remove the outer layer of tissue from the pleural space (decortication).  A procedure to put medicine into the chest cavity to seal the pleural space and prevent fluid buildup (pleurodesis).  Chemotherapy and radiation therapy, if you have cancerous (malignant) pleural effusion. These treatments are typically used to treat cancer. They kill certain cells in the body.   Follow these instructions at home:  Take over-the-counter and prescription medicines only as told by your health care provider.  Ask your health care provider what activities are safe for you.  Keep track of how long you are able to do mild exercise (such as walking) before you get short of breath. Write down this information to share with your health care provider. Your ability to exercise should improve over time.  Do not use any products that contain nicotine or tobacco, such as cigarettes and e-cigarettes. If you need help quitting, ask your health care provider.  Keep all follow-up visits as told by your health care provider. This is important. Contact a health care provider if:  The  amount of time that you are able to do mild exercise: ? Decreases. ? Does not improve with time.  You have a fever. Get help right away if:  You are short of breath.  You develop chest pain.  You develop a new cough. Summary  Pleural  effusion is an abnormal buildup of fluid in the layers of tissue between the lungs and the inside of the chest.  Pleural effusion can have many causes, including heart failure, pulmonary embolism, infections, or cancer.  Symptoms of pleural effusion can include shortness of breath, chest pain, fever, long-lasting (chronic) cough, hiccups, or rapid breathing.  Diagnosis often involves making images of the chest (such as with ultrasound or X-ray) and removing fluid (thoracentesis) to send for testing.  Treatment for pleural effusion depends on what underlying condition is causing it. This information is not intended to replace advice given to you by your health care provider. Make sure you discuss any questions you have with your health care provider. Document Revised: 05/24/2020 Document Reviewed: 04/25/2020 Elsevier Patient Education  2021 [employer].  Thoracentesis  A thoracentesis is a procedure to remove excess fluid that has built up in the space between the linings of the chest wall and the lungs (pleural space). It is normal to have a small amount of fluid in the pleural space. Some medical conditions, such as heart failure, pneumonia, kidney problems, or cancer, can create too much fluid. This extra fluid is removed using a needle that is inserted through the skin and tissue and into the pleural space. A thoracentesis may be done to:  Understand why there is extra fluid in the pleural space and to create a treatment plan that is right for you.  Help get rid of shortness of breath, discomfort, or pain that is caused by the extra fluid. Tell a health care provider about:  Any allergies you have.  All medicines you are taking, including vitamins, herbs, eye drops, creams, and over-the-counter medicines. This includes any use of steroids, either by mouth or as a cream.  Any problems you or family members have had with anesthetic medicines.  Any blood disorders you have, including  any history of blood clots.  Any surgeries you have had.  Medical conditions you have, including a frequent cough or coughing episodes.  Whether you are pregnant or may be pregnant. What are the risks? Generally, this is a safe procedure. However, problems may occur, including:  Infection.  Bleeding.  Injury to the lung.  Injury to surrounding tissues or organs.  Collapse of the lung. What happens before the procedure?  Follow instructions from your health care provider about hydration, which may include: ? Up to 2 hours before the procedure - you may continue to drink clear liquids, such as water, clear fruit juice, black coffee, and plain tea. Eating and drinking restrictions Follow instructions from your health care provider about eating and drinking, which may include:  8 hours before the procedure - stop eating heavy meals or foods such as meat, fried foods, or fatty foods.  6 hours before the procedure - stop eating light meals or foods, such as toast or cereal.  6 hours before the procedure - stop drinking milk or drinks that contain milk.  2 hours before the procedure - stop drinking clear liquids. Medicines Ask your health care provider about:  Changing or stopping your regular medicines. This is especially important if you are taking diabetes medicines or blood thinners.  Taking medicines such as  aspirin and ibuprofen. These medicines can thin your blood. Do not take these medicines unless your health care provider tells you to take them.  Taking over-the-counter medicines, vitamins, herbs, and supplements.  Taking a cough suppressant if you have a frequent cough or coughing episodes. General instructions  You may have a chest X-ray or another imaging test, such as a CT scan or ultrasound, to determine the location and amount of fluid in your pleural space.  Plan to have someone take you home from the hospital or clinic.  Plan to have a responsible adult  care for you for at least 24 hours after you leave the hospital or clinic. This is important.  Ask your health care provider what steps will be taken to help prevent infection. These may include: ? Removing hair at the needle-insertion site. ? Washing skin with a germ-killing soap. What happens during the procedure?  You will be asked to sit upright and lean slightly forward for the procedure.  An IV will be inserted into one of your veins.  You will be given one or both of the following: ? A medicine to help you relax (sedative). ? A medicine to numb the area (local anesthetic).  The health care provider will insert a needle into your back so that it goes between the ribs and into the pleural space. You may feel pressure or slight pain as the needle is positioned into the pleural space.  Fluid will be removed from the pleural space through the needle. You may feel pressure as the fluid is removed.  The health care provider will take the needle out after the excess fluid has been removed. A sample of the fluid may be sent to the lab for testing.  The needle insertion site (puncture site) will be covered with a bandage (dressing). The procedure may vary among health care providers and hospitals. What happens after the procedure?  Your blood pressure, heart rate, breathing rate, and blood oxygen level will be monitored until you leave the hospital or clinic.  A chest X-ray may be done to check the amount of fluid that remains in your pleural space.  If a sample of fluid was sent for testing, ask your health care provider, or the department that did the procedure, when your results will be ready. It is up to you to get your test results.  Do not drive for 24 hours if you were given a sedative during your procedure. Summary  A thoracentesis is a procedure to remove excess fluid that has built up in the space between the linings of the chest wall and the lungs (pleural space).  Some  medical conditions, such as heart failure, pneumonia, kidney problems, or cancer, can create too much fluid.  For the procedure, a needle will be inserted between your ribs and into the pleural space. Fluid will be removed from the pleural space through the needle. The fluid may be sent to a lab for testing.  After the procedure, you may have a chest X-ray to check the amount of fluid still in your pleural space. This information is not intended to replace advice given to you by your health care provider. Make sure you discuss any questions you have with your health care provider. Document Revised: 04/25/2020 Document Reviewed: 04/25/2020 Elsevier Patient Education  2021 [place] [employer].    Indwelling Pleural Catheter Home Guide  An indwelling pleural catheter is a thin, flexible tube that is inserted under your skin and into your  chest. The catheter drains excess fluid that collects in the area between the chest wall and the lungs (pleural space).After the catheter is inserted, it can be attached to a bottle that collects fluid. The pleural catheter will allow you to drain fluid from your chest at home on a regular basis (sometimes daily). This will eliminate the need for frequent visits to the hospital or clinic to drain the fluid. The catheter may be removed after the excess fluid problem is resolved, usually after 2-3 months. It is important to follow instructions from your health care provider about how to drain and care for your catheter. What are the risks? Generally, this is a safe procedure. However, problems may occur, including:  Infection.  Skin damage around the catheter.  Lung damage.  Failure of the chest tube to work properly.  Spreading of cancer cells along the catheter, if you have cancer. Supplies needed:  Vacuum-sealed drainage bottle with attached drainage line.  Sterile dressing.  Sterile alcohol pads.  Sterile gloves.  Valve cap.  Sterile gauze pads, 4  4  inch (10 cm  10 cm).  Tape.  Adhesive dressing.  Sterile foam catheter pad. How to care for your catheter and insertion site  Wash your hands with soap and warm water before and after touching the catheter or insertion site. If soap and water are not available, use hand sanitizer.  Check your bandage (dressing) daily to make sure it is clean and dry.  Keep the skin around the catheter clean and dry.  Check the catheter regularly for any cracks or kinks in the tubing.  Check your catheter insertion site every day for signs of infection. Check for: ? Skin breakdown. ? Redness, swelling, or pain. ? Fluid or blood. ? Warmth. ? Pus or a bad smell. How to drain your catheter You may need to drain your catheter every day, or more or less often as told by your health care provider. Follow instructions from your health care provider about how to drain your catheter. You may also refer to instructions that come with the drainage system. To drain the catheter: 1. Wash your hands with soap and warm water. If soap and water are not available, use hand sanitizer. 2. Carefully remove the dressing from around the catheter. 3. Wash your hands again. 4. Put on the gloves provided. 5. Prepare the vacuum-sealed drainage bottle and drainage line. Close the drainage line of the vacuum-sealed drainage bottle by squeezing the pinch clamp or rolling the wheel of the roller clamp toward the bottle. The vacuum in the bottle will be lost if the line is not closed completely. 6. Remove the access tip cover from the drainage line. Do not touch the end. Set it on a sterile surface. 7. Remove the catheter valve cap and throw it away. 8. Use an alcohol pad to clean the end of the catheter. 9. Insert the access tip into the catheter valve. Make sure the valve and access tip are securely connected. Listen for a click to confirm that they are connected. 10. Insert the T plunger to break the vacuum seal on the  drainage bottle. 11. Open the clamp on the drainage line. 12. Allow the catheter to drain. Keep the catheter and the drainage bottle below the level of your chest. There may be a one-way valve on the end of the tubing that will allow liquid and air to flow out of the catheter without letting air inside. 13. Drain the amount of fluid  as told by your health care provider. It usually takes 5-15 minutes. Do not drain more than 1000 mL of fluid. You may feel a little discomfort while you are draining. If the pain is severe, stop draining and contact your health care provider. 14. After you finish draining the catheter, remove the drainage bottle tubing from the catheter. 15. Use a clean alcohol pad to wipe the catheter tip. 16. Place a clean cap on the end of the catheter. 17. Use an alcohol pad to clean the skin around the catheter. 18. Allow the skin to air-dry. 19. Put the catheter pad on your skin. Curl the catheter into loops and place it on the pad. Do not place the catheter on your skin. 20. Replace the dressing over the catheter. 21. Discard the drainage bottle as instructed by your health care provider. Do not reuse the drainage bottle. How to change your dressing Change your dressing at least once a week, or more often if needed to keep the dressing dry. Be sure to change the dressing whenever it becomes moist. Your health care provider will tell you how often to change your dressing. 1. Wash your hands with soap and warm water. If soap and water are not available, use hand sanitizer. 2. Gently remove the old dressing. Avoid using scissors to remove the dressing. Sharp objects may damage the catheter. 3. Wash the skin around the insertion site with mild, fragrance-free soap and warm water. Rinse well, then pat the area dry with a clean cloth. 4. Check the skin around the catheter for signs of infection. Check for: ? Skin breakdown. ? Redness, swelling, or pain. ? Fluid or  blood. ? Warmth. ? Pus or a bad smell. 5. If your catheter was stitched (sutured) to your skin, look at the suture to make sure it is still anchored in your skin. 6. Do not apply creams, ointments, or alcohol to the area. Let your skin air-dry completely before you apply a new dressing. 7. Curl the catheter into loops and place it on the sterile catheter pad. Do not place the catheter on your skin. 8. If you do not have a pad, use a clean dressing. Slide the dressing under the disk that holds the drainage catheter in place. 9. Use gauze to cover the catheter and the catheter pad. The catheter should rest on the pad or dressing, not on your skin. 10. Tape the dressing to your skin. You may be instructed to use an adhesive dressing covering instead of gauze and tape. 11. Wash your hands with soap and warm water. If soap and water are not available, use hand sanitizer. General recommendations  Always wash your hands with soap and warm water before and after caring for your catheter and drainage bottle. Use a mild, fragrance-free soap. If soap and water are not available, use hand sanitizer.  Always make sure there are no leaks in the catheter or drainage bottle.  Each time you drain the catheter, note the color and amount of fluid.  Do not touch the tip of the catheter or the drainage bottle tubing.  Do not reuse drainage bottles.  Do not take baths, swim, or use a hot tub until your health care provider approves. Ask your health care provider if you may take showers. You may only be allowed to take sponge baths.  Take deep breaths regularly, followed by a cough. Doing this can help to prevent lung infection. Contact a health care provider if:  You have  any questions about caring for your catheter or drainage bottle.  You still have pain at the catheter insertion site more than 2 days after your procedure.  You have pain while draining your catheter.  Your catheter becomes bent,  twisted, or cracked.  The connection between the catheter and the collection bottle becomes loose.  You have any of these around your catheter insertion site or coming from it: ? Skin breakdown. ? Redness, swelling, or pain. ? Fluid or blood. ? Warmth. ? Pus or a bad smell. Get help right away if:  You have a fever or chills.  You have chest pain.  You have dizziness or shortness of breath.  You have severe redness, swelling, or pain at your catheter insertion site.  The catheter comes out.  The catheter is blocked or clogged. Summary  An indwelling pleural catheter is a thin, flexible tube that is inserted under your skin and into your chest. The catheter drains excess fluid that collects in the area between the chest wall and the lungs (pleural space).  It is important to follow instructions from your health care provider about how to drain and care for your catheter.  Do not touch the tip of the catheter or the drainage bottle tubing.  Always wash your hands with soap and water before and after caring for your catheter and drainage bottle. If soap and water are not available, use hand sanitizer. This information is not intended to replace advice given to you by your health care provider. Make sure you discuss any questions you have with your health care provider. Document Revised: 06/24/2020 Document Reviewed: 04/25/2020 Elsevier Patient Education  2021 [employer].

## 2020-11-26 NOTE — Telephone Encounter (Signed)
Patient updated on plan. Nothing further needed.

## 2020-11-26 NOTE — Telephone Encounter (Signed)
OK, she needs to be seen in clinic this week. Please schedule with APP.   [name], DO [facility] 11/26/2020 10:36 AM

## 2020-11-26 NOTE — Telephone Encounter (Signed)
Pt feels that the fluid in her lungs has returned on the right side.  How did you want to proceed?  thanks

## 2020-11-26 NOTE — Telephone Encounter (Signed)
I called the pt to make her aware of BI recs.  She stated that the appt with Dr. Kipp Brood is a virtual visit.  She stated that she does not think that she can wait for another 3 days.  She stated that she has been having this feeling for the last 4 days and it keeps feeling more full.  She stated that she is not having SHOB but is having that pain in her right side.

## 2020-11-26 NOTE — Telephone Encounter (Signed)
PCCM:  I need to discuss case with Dr. Kipp Brood since she is post-op.   [facility] 11/26/2020 12:10 PM

## 2020-11-26 NOTE — Telephone Encounter (Signed)
She is seeing Dr. Kipp Brood on Friday. I am sure he will repeat her CXR.  If the fluid is back we can arrange drainage again if he thinks it is necessary   CC: Dr. Kipp Brood as an Dianne Dun, DO [facility] 11/26/2020 9:38 AM

## 2020-11-26 NOTE — Telephone Encounter (Signed)
[facility] plans to discuss pleurex with her on Friday.  I would be available to place it on April 1st or April 5th  [name], [place] [facility] 11/26/2020 2:49 PM

## 2020-11-26 NOTE — Progress Notes (Signed)
@Patient  ID: Tonya [name], female    DOB: 10-31-1945, 75 y.o.   MRN: [id number]  Chief Complaint  Patient presents with   Follow-up     R side lung pain- started on thursday    Referring provider: Lin Landsman, MD  HPI: 75 year old female, former smoker quit in 2001 (18-pack-year history).  Past medical history significant for recurrent right pleural effusion, added no carcinoma of right lung, mediastinal adenopathy, GERD, hyperlipidemia.  Patient of Dr. Valeta Harms, last seen on 10/17/2020.  Patient underwent thoracentesis on 10/17/20 and 11/04/2020 for right-sided pleural effusion with Dr. Valeta Harms. Cytology from pleural fluid did not show malignancy.  Predominantly lymphocytic which can be seen postoperatively. Following with Dr. Mohammed/Dr. Kipp Brood.   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 Patient last saw Dr. Earlie Server on 11/19/2020 and received chemotherapy (Cisplatin and Alimata).   Patient called our office today with reports of increased fatigue, shortness of breath and pain on right side. Pain stated Thursday 11/21/20 after having chemotherapy two days earlier. Since her second round of chemo she has been struggling with nausea. She has been in bed pretty much all week.  She feels as though fluid in right lung has reaccumulated. CXR today showed moderate right sided pleural effusion. She has a virtual visit with Dr. Kipp Brood at the end of this week. Denies f/c/s or hemoptysis.   Imaging: 10/17/20 CXR- No pneumothorax status post thoracentesis. Ill-defined opacity at the right lung base likely scarring although infectious/inflammatory etiology not excluded.  11/01/20 CXR- New right pleural effusion. Persistent elevation right hemidiaphragm. Stable airspace opacity in the  right hilum which potentially may represent atelectasis. A well-defined mass is not seen by radiography.    11/04/20 CXR-  Improvement in right pleural effusion with minimal residual effusion. No pneumothorax.  Allergies  Allergen Reactions   Other Anaphylaxis    Spiders   Penicillins Hives    REACTION: 20 years    Immunization History  Administered Date(s) Administered   Influenza, High Dose Seasonal PF 07/17/2020   PFIZER(Purple Top)SARS-COV-2 Vaccination 11/09/2019, 12/05/2019, 07/17/2020   Pneumococcal Polysaccharide-23 07/31/2017    Past Medical History:  Diagnosis Date   Adenoma of left adrenal gland    Cancer ([place])    Lung   Colon polyps    hyperplastic   Complication of anesthesia    Very emotional and cries after anesthesia   GERD (gastroesophageal reflux disease)    Kidney stone    Liver lesion    Microhematuria     Tobacco History: Social History   Tobacco Use  Smoking Status Former Smoker   Packs/day: 0.30   Years: 60.00   Pack years: 18.00   Types: Cigarettes   Quit date: 06/11/2020   Years since quitting: 0.4  Smokeless Tobacco Never Used   Counseling given: Not Answered   Outpatient Medications Prior to Visit  Medication Sig Dispense Refill   acetaminophen (TYLENOL) 500 MG tablet Take 2 tablets (1,000 mg total) by mouth every 6 (six) hours as needed. 30 tablet 0   albuterol (VENTOLIN HFA) 108 (90 Base) MCG/ACT inhaler Inhale 1-2 puffs into the lungs every 4 (four) hours as needed for wheezing or shortness of breath.     alendronate (FOSAMAX) 70 MG tablet Take 70 mg by mouth every Saturday.     ALPRAZolam Duanne Moron) 1  MG tablet Take 1 mg by mouth 3 (three) times daily as needed for anxiety or sleep.     Calcium Carbonate-Vitamin D (CALCIUM + D PO) Take 1 tablet by mouth in the morning and at bedtime.     cyclobenzaprine (FLEXERIL) 5 MG tablet Take 5 mg by mouth 3 (three) times daily as needed for muscle spasms (sciatic  nerve).      dexamethasone (DECADRON) 2 MG tablet Take 4 mg by mouth See admin instructions. Take 2 tablets by mouth twice daily the day before, day of and day after the chemotherapy every 3 weeks.     folic acid (FOLVITE) 1 MG tablet Take 1 tablet (1 mg total) by mouth daily. (Patient taking differently: Take 1 mg by mouth daily at 12 noon.) 30 tablet 4   guaiFENesin (MUCINEX) 600 MG 12 hr tablet Take 2 tablets (1,200 mg total) by mouth 2 (two) times daily as needed. (Patient taking differently: Take 1,200 mg by mouth 2 (two) times daily as needed for cough.)     ibuprofen (ADVIL) 800 MG tablet Take 1 tablet (800 mg total) by mouth every 8 (eight) hours as needed for moderate pain. 30 tablet 0   Lidocaine 4 % PTCH Place 1 patch onto the skin daily as needed (pain).     magnesium oxide (MAG-OX) 400 (241.3 Mg) MG tablet Take 1 tablet (400 mg total) by mouth 2 (two) times daily. 60 tablet 1   naproxen (NAPROSYN) 375 MG tablet Take 1 tablet (375 mg total) by mouth 2 (two) times daily as needed for moderate pain. 8 tablet 0   omeprazole (PRILOSEC) 40 MG capsule Take 40 mg by mouth 2 (two) times daily.     oxyCODONE-acetaminophen (PERCOCET) 10-325 MG tablet Take 1 tablet by mouth every 4 (four) hours as needed for moderate pain.      pregabalin (LYRICA) 25 MG capsule Take 1 capsule (25 mg total) by mouth 2 (two) times daily. 60 capsule 0   prochlorperazine (COMPAZINE) 10 MG tablet Take 1 tablet (10 mg total) by mouth every 6 (six) hours as needed for nausea or vomiting. 30 tablet 0   rosuvastatin (CRESTOR) 10 MG tablet Take 10 mg by mouth at bedtime.     Tiotropium Bromide-Olodaterol (STIOLTO RESPIMAT) 2.5-2.5 MCG/ACT AERS Inhale 2 puffs into the lungs daily. (Patient taking differently: Inhale 2 puffs into the lungs daily as needed (respiratory issues).) 4 g 0   No facility-administered medications prior to visit.   Review of Systems  Review of Systems  Constitutional: Positive for  appetite change and fatigue. Negative for chills and fever.  HENT: Negative.   Respiratory: Positive for shortness of breath. Negative for chest tightness and wheezing.   Cardiovascular:       Pleuritic chest pain   Physical Exam  BP 120/82 (BP Location: Left Arm, Cuff Size: Normal)    Pulse 75    Temp (!) 97 F (36.1 C)    Ht 5\' 4"  (1.626 m)    Wt 139 lb (63 kg)    SpO2 100%    BMI 23.86 kg/m  Physical Exam Constitutional:      Appearance: Normal appearance.     Comments: Appears fatigued  HENT:     Mouth/Throat:     Comments: Deferred d/t masking Cardiovascular:     Rate and Rhythm: Normal rate and regular rhythm.  Pulmonary:     Effort: Pulmonary effort is normal.     Breath sounds: No wheezing, rhonchi or rales.  Comments: Mostly clear, moving good air bilaterally  Musculoskeletal:        General: Normal range of motion.  Skin:    General: Skin is warm and dry.  Neurological:     General: No focal deficit present.     Mental Status: She is alert and oriented to person, place, and time. Mental status is at baseline.  Psychiatric:        Mood and Affect: Mood normal.        Behavior: Behavior normal.        Thought Content: Thought content normal.        Judgment: Judgment normal.      Lab Results:  CBC    Component Value Date/Time   WBC 3.7 (L) 11/26/2020 0915   WBC 10.8 (H) 08/28/2020 0140   RBC 3.60 (L) 11/26/2020 0915   HGB 11.1 (L) 11/26/2020 0915   HCT 33.4 (L) 11/26/2020 0915   PLT 179 11/26/2020 0915   MCV 92.8 11/26/2020 0915   MCH 30.8 11/26/2020 0915   MCHC 33.2 11/26/2020 0915   RDW 12.0 11/26/2020 0915   LYMPHSABS 1.5 11/26/2020 0915   MONOABS 0.3 11/26/2020 0915   EOSABS 0.0 11/26/2020 0915   BASOSABS 0.0 11/26/2020 0915    BMET    Component Value Date/Time   NA 142 11/26/2020 0915   K 3.7 11/26/2020 0915   CL 104 11/26/2020 0915   CO2 28 11/26/2020 0915   GLUCOSE 94 11/26/2020 0915   BUN 11 11/26/2020 0915   CREATININE 0.78  11/26/2020 0915   CALCIUM 8.3 (L) 11/26/2020 0915   GFRNONAA >60 11/26/2020 0915   GFRAA >60 04/01/2020 1706    BNP No results found for: BNP  ProBNP No results found for: PROBNP  Imaging: DG Chest 1 View  Result Date: 11/04/2020 CLINICAL DATA:  Post thoracentesis EXAM: CHEST  1 VIEW COMPARISON:  11/01/2020 FINDINGS: Improvement in right pleural effusion with minimal residual effusion. No pneumothorax. Surgical staples in the right lung base. Left lung is clear.  Surgical staples in the left upper lobe. Thoracic scoliosis. Negative for heart failure. Atherosclerotic calcification aorta. IMPRESSION: No complication post right thoracentesis. Electronically Signed   By: Franchot Gallo M.D.   On: 11/04/2020 16:18   DG Chest 2 View  Result Date: 11/26/2020 CLINICAL DATA:  History of pleural effusion. Adenocarcinoma of the right lung EXAM: CHEST - 2 VIEW COMPARISON:  11/01/2020, 11/04/2020 FINDINGS: Reaccumulation of right-sided pleural effusion, now moderate-sized. Persistent right perihilar opacity. Mild streaky left basilar opacity. Heart size within normal limits. No left-sided pleural effusion. No pneumothorax. IMPRESSION: 1. Reaccumulation of right-sided pleural effusion, now moderate-sized. 2. Persistent right perihilar opacity. 3. Probable mild left basilar atelectasis. Electronically Signed   By: Davina Poke D.O.   On: 11/26/2020 11:49   DG Chest 2 View  Result Date: 11/01/2020 CLINICAL DATA:  Adenocarcinoma right lung stage II EXAM: CHEST - 2 VIEW COMPARISON:  October 17, 2020 FINDINGS: There is persistent elevation of the right hemidiaphragm. There is a degree of pleural effusion on the right. There is stable opacity in the right perihilar region. Left lung is clear. Heart size is normal. No adenopathy is appreciable by radiography. There is calcification in the lateral right shoulder, likely representing calcific tendinosis. IMPRESSION: New right pleural effusion. Persistent  elevation right hemidiaphragm. Stable airspace opacity in the right hilum which potentially may represent atelectasis. A well-defined mass is not seen by radiography. Stable cardiac silhouette. Left lung clear. Suspect calcific tendinosis lateral  right shoulder. Electronically Signed   By: Lowella Grip III M.D.   On: 11/01/2020 11:00     Assessment & Plan:   Pleural effusion, right - Increased shortness of breath and right sided chest discomfort x 4-5 days - CXR today showed moderate sized right pleural effusion - I will reach out to Dr. Valeta Harms and we will set up date/time for thoracentesis. Previous pleural fluid sample did not have malignant cells, felt to be post-op related. Unclear if she would benefit from indwelling pleural catheter.  - Follow-up Virtual visit with Dr. Kipp Brood Friday and April 4th with Dr. Marthe Patch, NP 11/26/2020

## 2020-11-27 ENCOUNTER — Telehealth: Payer: Self-pay | Admitting: Primary Care

## 2020-11-27 ENCOUNTER — Other Ambulatory Visit: Payer: Self-pay | Admitting: Pulmonary Disease

## 2020-11-27 DIAGNOSIS — J90 Pleural effusion, not elsewhere classified: Secondary | ICD-10-CM

## 2020-11-27 NOTE — Telephone Encounter (Signed)
Call made to [facility] endoscopy spoke with [facility] scheduled for endoscopy at 2pm on Friday.   Call made to patient, made aware of appt date and time. Aware to report to [facility] main entrance and go to admitting.   Covid test scheduled for 11/28/20 at 930am. Patient aware of time and date as well.   Voiced understanding.   Nothing further needed at this time.

## 2020-11-27 NOTE — Telephone Encounter (Signed)
Call returned to patient, confirmed DOB. Patient states she was here yesterday and she saw [place]. She states Beth mentioned something about her getting a chest catheter. She wants the fluid removed from her lungs but she is wanting to wait until after she speaks with Dr Kipp Brood to decide if she wants the catheter placed or not. She would like the fluid removed asap.   Beth and/or BI please advise.

## 2020-11-27 NOTE — Progress Notes (Signed)
Orders placed for thoracentesis   [name], DO [facility] 11/27/2020 3:31 PM

## 2020-11-27 NOTE — Progress Notes (Signed)
I am not sure what you mean for approval. I will send send a message to the [facility] team and see what they have available.

## 2020-11-27 NOTE — Addendum Note (Signed)
Addended by: Vivia Ewing on: 11/27/2020 05:21 PM   Modules accepted: Orders

## 2020-11-27 NOTE — Telephone Encounter (Signed)
I can drain the fluid at the hospital on Friday of this week at the hospital.   Please scheduled at [facility] for outpatient thoracentesis.   [name], DO [facility] 11/27/2020 3:30 PM

## 2020-11-27 NOTE — Progress Notes (Signed)
Order placed. Covid test scheduled. Spoke with [facility] scheduled for Friday at 2pm.   See other note. Thanks BB

## 2020-11-28 ENCOUNTER — Other Ambulatory Visit (HOSPITAL_COMMUNITY)
Admission: RE | Admit: 2020-11-28 | Discharge: 2020-11-28 | Disposition: A | Discharge status: Home / Self Care | Payer: Medicare HMO | Source: Ambulatory Visit | Attending: Pulmonary Disease | Admitting: Pulmonary Disease

## 2020-11-28 ENCOUNTER — Other Ambulatory Visit: Payer: Self-pay | Admitting: Internal Medicine

## 2020-11-28 DIAGNOSIS — Z20822 Contact with and (suspected) exposure to covid-19: Secondary | ICD-10-CM | POA: Insufficient documentation

## 2020-11-28 DIAGNOSIS — Z01812 Encounter for preprocedural laboratory examination: Secondary | ICD-10-CM | POA: Diagnosis present

## 2020-11-28 LAB — SARS CORONAVIRUS 2 (TAT 6-24 HRS): SARS Coronavirus 2: NEGATIVE

## 2020-11-29 ENCOUNTER — Encounter (HOSPITAL_COMMUNITY)
Admission: RE | Disposition: A | Discharge status: Home / Self Care | Payer: Self-pay | Source: Home / Self Care | Attending: Pulmonary Disease

## 2020-11-29 ENCOUNTER — Ambulatory Visit (HOSPITAL_COMMUNITY): Payer: Medicare HMO

## 2020-11-29 ENCOUNTER — Telehealth (INDEPENDENT_AMBULATORY_CARE_PROVIDER_SITE_OTHER): Payer: Medicare HMO | Admitting: Thoracic Surgery (Cardiothoracic Vascular Surgery)

## 2020-11-29 ENCOUNTER — Encounter (HOSPITAL_COMMUNITY): Payer: Self-pay | Admitting: Pulmonary Disease

## 2020-11-29 ENCOUNTER — Ambulatory Visit (HOSPITAL_COMMUNITY)
Admission: RE | Admit: 2020-11-29 | Discharge: 2020-11-29 | Disposition: A | Discharge status: Home / Self Care | Payer: Medicare HMO | Attending: Pulmonary Disease | Admitting: Pulmonary Disease

## 2020-11-29 ENCOUNTER — Other Ambulatory Visit: Payer: Self-pay

## 2020-11-29 DIAGNOSIS — J90 Pleural effusion, not elsewhere classified: Secondary | ICD-10-CM | POA: Diagnosis present

## 2020-11-29 DIAGNOSIS — Z79899 Other long term (current) drug therapy: Secondary | ICD-10-CM | POA: Diagnosis not present

## 2020-11-29 DIAGNOSIS — Z880 Allergy status to penicillin: Secondary | ICD-10-CM | POA: Insufficient documentation

## 2020-11-29 DIAGNOSIS — Z9221 Personal history of antineoplastic chemotherapy: Secondary | ICD-10-CM | POA: Diagnosis not present

## 2020-11-29 DIAGNOSIS — Z888 Allergy status to other drugs, medicaments and biological substances status: Secondary | ICD-10-CM | POA: Diagnosis not present

## 2020-11-29 DIAGNOSIS — Z85118 Personal history of other malignant neoplasm of bronchus and lung: Secondary | ICD-10-CM | POA: Diagnosis not present

## 2020-11-29 DIAGNOSIS — K219 Gastro-esophageal reflux disease without esophagitis: Secondary | ICD-10-CM | POA: Diagnosis not present

## 2020-11-29 DIAGNOSIS — Z87891 Personal history of nicotine dependence: Secondary | ICD-10-CM | POA: Insufficient documentation

## 2020-11-29 DIAGNOSIS — C3491 Malignant neoplasm of unspecified part of right bronchus or lung: Secondary | ICD-10-CM

## 2020-11-29 DIAGNOSIS — Z9889 Other specified postprocedural states: Secondary | ICD-10-CM

## 2020-11-29 HISTORY — PX: THORACENTESIS: SHX235

## 2020-11-29 LAB — BODY FLUID CELL COUNT WITH DIFFERENTIAL
Eos, Fluid: 0 %
Lymphs, Fluid: 88 %
Monocyte-Macrophage-Serous Fluid: 10 % — ABNORMAL LOW (ref 50–90)
Neutrophil Count, Fluid: 2 % (ref 0–25)
Total Nucleated Cell Count, Fluid: 960 cu mm (ref 0–1000)

## 2020-11-29 LAB — PROTEIN, PLEURAL OR PERITONEAL FLUID: Total protein, fluid: 3.1 g/dL

## 2020-11-29 LAB — LACTATE DEHYDROGENASE, PLEURAL OR PERITONEAL FLUID: LD, Fluid: 87 U/L — ABNORMAL HIGH (ref 3–23)

## 2020-11-29 LAB — GLUCOSE, PLEURAL OR PERITONEAL FLUID: Glucose, Fluid: 100 mg/dL

## 2020-11-29 SURGERY — THORACENTESIS
Anesthesia: Topical | Laterality: Right

## 2020-11-29 NOTE — Progress Notes (Signed)
     [address]       [place] [address]             [phone]       Patient: Home Provider: Office Consent for Telemedicine visit obtained.  Today's visit was completed via a real-time telehealth (see specific modality noted below). The patient/authorized person provided oral consent at the time of the visit to engage in a telemedicine encounter with the present provider at [facility]. The patient/authorized person was informed of the potential benefits, limitations, and risks of telemedicine. The patient/authorized person expressed understanding that the laws that protect confidentiality also apply to telemedicine. The patient/authorized person acknowledged understanding that telemedicine does not provide emergency services and that he or she would need to call 911 or proceed to the nearest hospital for help if such a need arose.  . Total time spent in the clinical discussion 5 minutes. . Telehealth Modality: Phone visit (audio only)  I had a telephone visit with [name]  Overall [name] is doing relatively well.  She intermittently has some shortness of breath, but she admits to not being very mobile.  She remains quite depressed about her diagnosis.  Reviewed her chest x-ray with her today and she has a recurrence in the right-sided pleural effusion.  We had a discussion about proceeding with a Pleurx catheter given the fact that she is already had 3 thoracentesis.  Strongly recommended that she undergo Pleurx catheter placement with Dr. Valeta Harms, but she would like some more time to think about it.  I further explained that this would be the most successful way in resolving this fusion given the fact that repeat thoracentesis is not a good option moving forward.  I will follow up with her in 1 month.

## 2020-11-29 NOTE — Discharge Instructions (Signed)
Thoracentesis  A thoracentesis is a procedure to remove excess fluid that has built up in the space between the linings of the chest wall and the lungs (pleural space). It is normal to have a small amount of fluid in the pleural space. Some medical conditions, such as heart failure, pneumonia, kidney problems, or cancer, can create too much fluid. This extra fluid is removed using a needle that is inserted through the skin and tissue and into the pleural space. A thoracentesis may be done to:  Understand why there is extra fluid in the pleural space and to create a treatment plan that is right for you.  Help get rid of shortness of breath, discomfort, or pain that is caused by the extra fluid. Tell a health care provider about:  Any allergies you have.  All medicines you are taking, including vitamins, herbs, eye drops, creams, and over-the-counter medicines. This includes any use of steroids, either by mouth or as a cream.  Any problems you or family members have had with anesthetic medicines.  Any blood disorders you have, including any history of blood clots.  Any surgeries you have had.  Medical conditions you have, including a frequent cough or coughing episodes.  Whether you are pregnant or may be pregnant. What are the risks? Generally, this is a safe procedure. However, problems may occur, including:  Infection.  Bleeding.  Injury to the lung.  Injury to surrounding tissues or organs.  Collapse of the lung. What happens before the procedure?  Follow instructions from your health care provider about hydration, which may include: ? Up to 2 hours before the procedure - you may continue to drink clear liquids, such as water, clear fruit juice, black coffee, and plain tea. Eating and drinking restrictions Follow instructions from your health care provider about eating and drinking, which may include:  8 hours before the procedure - stop eating heavy meals or foods such as  meat, fried foods, or fatty foods.  6 hours before the procedure - stop eating light meals or foods, such as toast or cereal.  6 hours before the procedure - stop drinking milk or drinks that contain milk.  2 hours before the procedure - stop drinking clear liquids. Medicines Ask your health care provider about:  Changing or stopping your regular medicines. This is especially important if you are taking diabetes medicines or blood thinners.  Taking medicines such as aspirin and ibuprofen. These medicines can thin your blood. Do not take these medicines unless your health care provider tells you to take them.  Taking over-the-counter medicines, vitamins, herbs, and supplements.  Taking a cough suppressant if you have a frequent cough or coughing episodes. General instructions  You may have a chest X-ray or another imaging test, such as a CT scan or ultrasound, to determine the location and amount of fluid in your pleural space.  Plan to have someone take you home from the hospital or clinic.  Plan to have a responsible adult care for you for at least 24 hours after you leave the hospital or clinic. This is important.  Ask your health care provider what steps will be taken to help prevent infection. These may include: ? Removing hair at the needle-insertion site. ? Washing skin with a germ-killing soap. What happens during the procedure?  You will be asked to sit upright and lean slightly forward for the procedure.  An IV will be inserted into one of your veins.  You will be given one  or both of the following: ? A medicine to help you relax (sedative). ? A medicine to numb the area (local anesthetic).  The health care provider will insert a needle into your back so that it goes between the ribs and into the pleural space. You may feel pressure or slight pain as the needle is positioned into the pleural space.  Fluid will be removed from the pleural space through the needle. You  may feel pressure as the fluid is removed.  The health care provider will take the needle out after the excess fluid has been removed. A sample of the fluid may be sent to the lab for testing.  The needle insertion site (puncture site) will be covered with a bandage (dressing). The procedure may vary among health care providers and hospitals. What happens after the procedure?  Your blood pressure, heart rate, breathing rate, and blood oxygen level will be monitored until you leave the hospital or clinic.  A chest X-ray may be done to check the amount of fluid that remains in your pleural space.  If a sample of fluid was sent for testing, ask your health care provider, or the department that did the procedure, when your results will be ready. It is up to you to get your test results.  Do not drive for 24 hours if you were given a sedative during your procedure. Summary  A thoracentesis is a procedure to remove excess fluid that has built up in the space between the linings of the chest wall and the lungs (pleural space).  Some medical conditions, such as heart failure, pneumonia, kidney problems, or cancer, can create too much fluid.  For the procedure, a needle will be inserted between your ribs and into the pleural space. Fluid will be removed from the pleural space through the needle. The fluid may be sent to a lab for testing.  After the procedure, you may have a chest X-ray to check the amount of fluid still in your pleural space. This information is not intended to replace advice given to you by your health care provider. Make sure you discuss any questions you have with your health care provider. Document Revised: 04/25/2020 Document Reviewed: 04/25/2020 Elsevier Patient Education  2021 [employer].

## 2020-11-29 NOTE — Interval H&P Note (Signed)
History and Physical Interval Note:  11/29/2020 2:48 PM  Tonya [name]  has presented today for surgery, with the diagnosis of pleural effusion.  The various methods of treatment have been discussed with the patient and family. After consideration of risks, benefits and other options for treatment, the patient has consented to  Procedure(s): THORACENTESIS (Right) as a surgical intervention.  The patient's history has been reviewed, patient examined, no change in status, stable for surgery.  I have reviewed the patient's chart and labs.  Questions were answered to the patient's satisfaction.     [place]

## 2020-11-29 NOTE — Op Note (Signed)
Thoracentesis  Procedure Note  Tonya [name]  [id number]  December 05, 1945  Date:11/29/20  Time:2:49 PM   Provider Performing:Tonya [name]   Procedure: Thoracentesis with imaging guidance ([id number])  Indication(s) Pleural Effusion  Consent Risks of the procedure as well as the alternatives and risks of each were explained to the patient and/or caregiver.  Consent for the procedure was obtained and is signed in the bedside chart  Anesthesia Topical only with 1% lidocaine   Time Out Verified patient identification, verified procedure, site/side was marked, verified correct patient position, special equipment/implants available, medications/allergies/relevant history reviewed, required imaging and test results available.  Sterile Technique Maximal sterile technique including full sterile barrier drape, hand hygiene, sterile gown, sterile gloves, mask, hair covering, sterile ultrasound probe cover (if used).  Procedure Description Ultrasound was used to identify appropriate pleural anatomy for placement and overlying skin marked.  Area of drainage cleaned and draped in sterile fashion. Lidocaine was used to anesthetize the skin and subcutaneous tissue.  350 cc's of amber appearing fluid was drained from the right pleural space. Catheter then removed and bandaid applied to site.  Complications/Tolerance None; patient tolerated the procedure well. Chest X-ray is ordered to confirm no post-procedural complication.  EBL Minimal  Specimen(s) Pleural fluid   Tonya Nash, DO [facility] 11/29/2020 2:50 PM        Right pleural effusion with enhancing pleural slide

## 2020-11-30 ENCOUNTER — Encounter (HOSPITAL_COMMUNITY): Payer: Self-pay | Admitting: Pulmonary Disease

## 2020-12-02 LAB — PATHOLOGIST SMEAR REVIEW: Path Review: REACTIVE

## 2020-12-03 ENCOUNTER — Other Ambulatory Visit: Payer: Self-pay

## 2020-12-03 ENCOUNTER — Inpatient Hospital Stay: Payer: Medicare HMO

## 2020-12-03 DIAGNOSIS — Z5111 Encounter for antineoplastic chemotherapy: Secondary | ICD-10-CM | POA: Diagnosis not present

## 2020-12-03 DIAGNOSIS — C3491 Malignant neoplasm of unspecified part of right bronchus or lung: Secondary | ICD-10-CM

## 2020-12-03 LAB — CBC WITH DIFFERENTIAL (CANCER CENTER ONLY)
Abs Immature Granulocytes: 0.01 10*3/uL (ref 0.00–0.07)
Basophils Absolute: 0 10*3/uL (ref 0.0–0.1)
Basophils Relative: 0 %
Eosinophils Absolute: 0 10*3/uL (ref 0.0–0.5)
Eosinophils Relative: 1 %
HCT: 31.4 % — ABNORMAL LOW (ref 36.0–46.0)
Hemoglobin: 10.1 g/dL — ABNORMAL LOW (ref 12.0–15.0)
Immature Granulocytes: 0 %
Lymphocytes Relative: 37 %
Lymphs Abs: 1.4 10*3/uL (ref 0.7–4.0)
MCH: 31.1 pg (ref 26.0–34.0)
MCHC: 32.2 g/dL (ref 30.0–36.0)
MCV: 96.6 fL (ref 80.0–100.0)
Monocytes Absolute: 0.3 10*3/uL (ref 0.1–1.0)
Monocytes Relative: 8 %
Neutro Abs: 2.1 10*3/uL (ref 1.7–7.7)
Neutrophils Relative %: 54 %
Platelet Count: 151 10*3/uL (ref 150–400)
RBC: 3.25 MIL/uL — ABNORMAL LOW (ref 3.87–5.11)
RDW: 12.6 % (ref 11.5–15.5)
WBC Count: 3.8 10*3/uL — ABNORMAL LOW (ref 4.0–10.5)
nRBC: 0 % (ref 0.0–0.2)

## 2020-12-03 LAB — CMP (CANCER CENTER ONLY)
ALT: 9 U/L (ref 0–44)
AST: 12 U/L — ABNORMAL LOW (ref 15–41)
Albumin: 3.3 g/dL — ABNORMAL LOW (ref 3.5–5.0)
Alkaline Phosphatase: 70 U/L (ref 38–126)
Anion gap: 11 (ref 5–15)
BUN: 14 mg/dL (ref 8–23)
CO2: 26 mmol/L (ref 22–32)
Calcium: 8.3 mg/dL — ABNORMAL LOW (ref 8.9–10.3)
Chloride: 108 mmol/L (ref 98–111)
Creatinine: 0.74 mg/dL (ref 0.44–1.00)
GFR, Estimated: >60 mL/min (ref >60)
Glucose, Bld: 103 mg/dL — ABNORMAL HIGH (ref 70–99)
Potassium: 4 mmol/L (ref 3.5–5.1)
Sodium: 145 mmol/L (ref 135–145)
Total Bilirubin: 0.3 mg/dL (ref 0.3–1.2)
Total Protein: 6.2 g/dL — ABNORMAL LOW (ref 6.5–8.1)

## 2020-12-03 LAB — MAGNESIUM: Magnesium: 1.5 mg/dL — ABNORMAL LOW (ref 1.7–2.4)

## 2020-12-04 LAB — CYTOLOGY - NON PAP

## 2020-12-06 NOTE — Progress Notes (Signed)
[facility] OFF[facility] PROGRESS NOTE  [address], [place] [address]  DIAGNOSIS: Stage IIB(T3, N0, M0) non-small cell lung cancer, adenocarcinoma involving the right upper lobe. The patient also has evidence for adenocarcinoma of the left upper lobe lung nodule as well as subcentimeter pulmonary nodules in the right lung concerning for stage IV lung cancer with multifocal disease bilaterally.  Biomarker Findings  Microsatellite status - MS-Equivocal ? Tumor Mutational Burden - Cannot Be Determined Genomic Findings For a complete list of the genes assayed, please refer to the Appendix. MET [id number]*2 AXL T343M TP53 I254V 7 Disease relevant genes with no reportable alterations: ALK, BRAF, EGFR, ERBB2, KRAS, RET, ROS1  PDL1 Expression 20 %.  PRIOR THERAPY: Status post right upper lobectomy with lymph node sampling on August 26, 2020 under the care of Dr. Kipp Brood.  CURRENT THERAPY: Adjuvant systemic chemotherapy with cisplatin 75 mg/M2 and Alimta 500/M2 every 3 weeks. First dose October 22, 2020.  Status post 2 cycles.  INTERVAL HISTORY: LAKEYSHA SLUTSKY 75 y.o. female returns to the clinic today for a follow-up visit accompanied by her husband.  The patient is feeling fair today without any concerning complaints except for persistent nausea. She only had one episode of vomiting since her last infusion but she states she stays nauseated. She has compazine for nausea. In the interval since her last appointment, the patient developed increased shortness of breath with exertion and chest discomfort.  She was seen by pulmonology and had a chest x-ray performed which showed a moderate sized pleural effusion.  She underwent a thoracentesis under the care of Dr. Valeta Harms on 11/29/2020 which yielded 350 cc of fluid.  Cytology was negative for malignant cells.  She also had her pleural effusion drained in February 2022 which also was negative for malignant  cells. She is scheduled for a pleurx catheter next week on 12/18/20. She states she does not have significant shortness of breath at the moment or cough but she can feel when the fluid re-accumulates because it feels "heavy".  Otherwise, she denies any fever, chills, night sweats, or weight loss.  She denies any diarrhea or constipation.  She denies any headache or visual changes. She denies signs of infection such as skin infections, sore throat, nasal congestion, or dysuria. She is here today for evaluation before starting cycle #3.  MEDICAL HISTORY: Past Medical History:  Diagnosis Date  . Adenoma of left adrenal gland   . Cancer ([facility])    Lung  . Colon polyps    hyperplastic  . Complication of anesthesia    Very emotional and cries after anesthesia  . GERD (gastroesophageal reflux disease)   . Kidney stone   . Liver lesion   . Microhematuria     ALLERGIES:  is allergic to other and penicillins.  MEDICATIONS:  Current Outpatient Medications  Medication Sig Dispense Refill  . acetaminophen (TYLENOL) 500 MG tablet Take 2 tablets (1,000 mg total) by mouth every 6 (six) hours as needed. (Patient taking differently: Take 1,000 mg by mouth every 6 (six) hours as needed for mild pain or moderate pain.) 30 tablet 0  . albuterol (VENTOLIN HFA) 108 (90 Base) MCG/ACT inhaler Inhale 1-2 puffs into the lungs every 4 (four) hours as needed for wheezing or shortness of breath.    Marland Kitchen alendronate (FOSAMAX) 70 MG tablet Take 70 mg by mouth every Saturday.    . ALPRAZolam (XANAX) 1 MG tablet Take 1 mg by mouth 3 (three) times  daily as needed for sleep.    . Calcium Carbonate-Vitamin D (CALCIUM + D PO) Take 1 tablet by mouth 2 (two) times daily.    . cyclobenzaprine (FLEXERIL) 5 MG tablet Take 5 mg by mouth 3 (three) times daily as needed for muscle spasms (sciatic nerve).     Marland Kitchen dexamethasone (DECADRON) 2 MG tablet Take 4 mg by mouth See admin instructions. Take 4 mg mouth twice daily the day before, day  of and 4 days after  the chemotherapy every 3 weeks.    . folic acid (FOLVITE) 1 MG tablet Take 1 tablet (1 mg total) by mouth daily. (Patient taking differently: Take 1 mg by mouth daily at 12 noon.) 30 tablet 4  . guaiFENesin (MUCINEX) 600 MG 12 hr tablet Take 2 tablets (1,200 mg total) by mouth 2 (two) times daily as needed. (Patient taking differently: Take 1,200 mg by mouth 2 (two) times daily as needed for to loosen phlegm.)    . ibuprofen (ADVIL) 800 MG tablet Take 1 tablet (800 mg total) by mouth every 8 (eight) hours as needed for moderate pain. 30 tablet 0  . Lidocaine 4 % PTCH Place 1 patch onto the skin daily as needed (sciatic nerve pain).    . magnesium oxide (MAG-OX) 400 (241.3 Mg) MG tablet Take 1 tablet (400 mg total) by mouth 2 (two) times daily. 60 tablet 1  . naproxen (NAPROSYN) 375 MG tablet Take 1 tablet (375 mg total) by mouth 2 (two) times daily as needed for moderate pain. 8 tablet 0  . omeprazole (PRILOSEC) 40 MG capsule Take 40 mg by mouth 2 (two) times daily.    Marland Kitchen oxyCODONE-acetaminophen (PERCOCET) 10-325 MG tablet Take 1 tablet by mouth every 8 (eight) hours as needed (sciatic nerve pain).    . pregabalin (LYRICA) 25 MG capsule Take 1 capsule (25 mg total) by mouth 2 (two) times daily. 60 capsule 0  . prochlorperazine (COMPAZINE) 10 MG tablet TAKE 1 TABLET(10 MG) BY MOUTH EVERY 6 HOURS AS NEEDED FOR NAUSEA OR VOMITING (Patient taking differently: Take 10 mg by mouth every 6 (six) hours as needed for nausea or vomiting.) 30 tablet 0  . rosuvastatin (CRESTOR) 10 MG tablet Take 10 mg by mouth at bedtime.    . Tiotropium Bromide-Olodaterol (STIOLTO RESPIMAT) 2.5-2.5 MCG/ACT AERS Inhale 2 puffs into the lungs daily. (Patient taking differently: Inhale 2 puffs into the lungs daily as needed (respiratory issues).) 4 g 0   No current facility-administered medications for this visit.    SURGICAL HISTORY:  Past Surgical History:  Procedure Laterality Date  . ABDOMINAL  HYSTERECTOMY    . APPENDECTOMY    . BRONCHIAL BIOPSY  07/18/2020   Procedure: BRONCHIAL BIOPSIES;  Surgeon: [name], DO;  Location: [facility];  Service: Pulmonary;;  . BRONCHIAL BRUSHINGS  07/18/2020   Procedure: BRONCHIAL BRUSHINGS;  Surgeon: [name], DO;  Location: [facility];  Service: Pulmonary;;  . BRONCHIAL NEEDLE ASPIRATION BIOPSY  07/18/2020   Procedure: BRONCHIAL NEEDLE ASPIRATION BIOPSIES;  Surgeon: [name], DO;  Location: [facility];  Service: Pulmonary;;  . BRONCHIAL WASHINGS  07/18/2020   Procedure: BRONCHIAL WASHINGS;  Surgeon: [name], DO;  Location: [facility];  Service: Pulmonary;;  . FIDUCIAL MARKER PLACEMENT  07/18/2020   Procedure: FIDUCIAL MARKER PLACEMENT;  Surgeon: [name], DO;  Location: [facility];  Service: Pulmonary;;  . INTERCOSTAL NERVE BLOCK Right 08/26/2020   Procedure: INTERCOSTAL NERVE BLOCK;  Surgeon: [name], MD;  Location: [facility];  Service: Thoracic;  Laterality: Right;  . IR THORACENTESIS ASP PLEURAL SPACE W/IMG GUIDE  10/10/2020  . NODE DISSECTION Right 08/26/2020   Procedure: NODE DISSECTION;  Surgeon: Corliss Skains, MD;  Location: [facility] OR;  Service: Thoracic;  Laterality: Right;  . THORACENTESIS Right 11/04/2020   Procedure: THORACENTESIS;  Surgeon: Josephine Igo, DO;  Location: [facility];  Service: Pulmonary;  Laterality: Right;  . THORACENTESIS Right 11/29/2020   Procedure: THORACENTESIS;  Surgeon: Josephine Igo, DO;  Location: [facility];  Service: Pulmonary;  Laterality: Right;  Marland Kitchen VIDEO BRONCHOSCOPY WITH ENDOBRONCHIAL NAVIGATION N/A 07/18/2020   Procedure: VIDEO BRONCHOSCOPY WITH ENDOBRONCHIAL NAVIGATION;  Surgeon: Josephine Igo, DO;  Location: [facility];  Service: Pulmonary;  Laterality: N/A;  . VIDEO BRONCHOSCOPY WITH ENDOBRONCHIAL ULTRASOUND  07/18/2020   Procedure: VIDEO BRONCHOSCOPY WITH ENDOBRONCHIAL ULTRASOUND;  Surgeon: Josephine Igo, DO;  Location: [facility];  Service: Pulmonary;;    REVIEW OF SYSTEMS:   Review of Systems  Constitutional: Positive for fatigue. Negative for appetite change, chills, fever and unexpected weight change.  HENT: Negative for mouth sores, nosebleeds, sore throat and trouble swallowing.   Eyes: Negative for eye problems and icterus.  Respiratory: Negative for cough, hemoptysis, shortness of breath and wheezing.   Cardiovascular: Negative for chest pain and leg swelling.  Gastrointestinal: Positive for nausea. Negative for abdominal pain, constipation, diarrhea, and vomiting.  Genitourinary: Negative for bladder incontinence, difficulty urinating, dysuria, frequency and hematuria.   Musculoskeletal: Positive for right rib/flank pain from surgery. Negative for back pain, gait problem, neck pain and neck stiffness.  Skin: Negative for itching and rash.  Neurological: Negative for dizziness, extremity weakness, gait problem, headaches, light-headedness and seizures.  Hematological: Negative for adenopathy. Does not bruise/bleed easily.  Psychiatric/Behavioral: Negative for confusion, depression and sleep disturbance. The patient is not nervous/anxious.     PHYSICAL EXAMINATION:  There were no vitals taken for this visit.  ECOG PERFORMANCE STATUS: 1 - Symptomatic but completely ambulatory  Physical Exam  Constitutional: Oriented to person, place, and time and well-developed, well-nourished, and in no distress.  HENT:  Head: Normocephalic and atraumatic.  Mouth/Throat: Oropharynx is clear and moist. No oropharyngeal exudate.  Eyes: Conjunctivae are normal. Right eye exhibits no discharge. Left eye exhibits no discharge. No scleral icterus.  Neck: Normal range of motion. Neck supple.  Cardiovascular: Normal rate, regular rhythm, normal heart sounds and intact distal pulses.   Pulmonary/Chest: Effort normal. Decreased breath sounds at base of right lung. No respiratory distress. No wheezes. No rales.   Abdominal: Soft. Bowel sounds are normal. Exhibits no distension and no mass. There is no tenderness.  Musculoskeletal: Normal range of motion. Exhibits no edema.  Lymphadenopathy:    No cervical adenopathy.  Neurological: Alert and oriented to person, place, and time. Exhibits normal muscle tone. Gait normal. Coordination normal.  Skin: Skin is warm and dry. No rash noted. Not diaphoretic. No erythema. No pallor.  Psychiatric: Mood, memory and judgment normal.  Vitals reviewed.  LABORATORY DATA: Lab Results  Component Value Date   WBC 3.8 (L) 12/03/2020   HGB 10.1 (L) 12/03/2020   HCT 31.4 (L) 12/03/2020   MCV 96.6 12/03/2020   PLT 151 12/03/2020      Chemistry      Component Value Date/Time   NA 145 12/03/2020 0859   K 4.0 12/03/2020 0859   CL 108 12/03/2020 0859   CO2 26 12/03/2020 0859   BUN 14 12/03/2020 0859   CREATININE  0.74 12/03/2020 0859      Component Value Date/Time   CALCIUM 8.3 (L) 12/03/2020 0859   ALKPHOS 70 12/03/2020 0859   AST 12 (L) 12/03/2020 0859   ALT 9 12/03/2020 0859   BILITOT 0.3 12/03/2020 0859       RADIOGRAPHIC STUDIES:  DG Chest 2 View  Result Date: 11/26/2020 CLINICAL DATA:  History of pleural effusion. Adenocarcinoma of the right lung EXAM: CHEST - 2 VIEW COMPARISON:  11/01/2020, 11/04/2020 FINDINGS: Reaccumulation of right-sided pleural effusion, now moderate-sized. Persistent right perihilar opacity. Mild streaky left basilar opacity. Heart size within normal limits. No left-sided pleural effusion. No pneumothorax. IMPRESSION: 1. Reaccumulation of right-sided pleural effusion, now moderate-sized. 2. Persistent right perihilar opacity. 3. Probable mild left basilar atelectasis. Electronically Signed   By: Davina Poke D.O.   On: 11/26/2020 11:49   DG CHEST PORT 1 VIEW  Result Date: 11/29/2020 CLINICAL DATA:  Right pleural effusion status post thoracentesis EXAM: PORTABLE CHEST 1 VIEW COMPARISON:  11/26/2020 FINDINGS: Single frontal  view of the chest demonstrates stable enlargement of the cardiac silhouette. Decreased right pleural effusion after reported right thoracentesis, with trace residual fluid at the right costophrenic angle. No evidence of pneumothorax. Postsurgical changes are seen within the right lung from partial pneumonectomy. Continued nodularity at the left apex with fiduciary markers again noted. Chronic interstitial scarring noted throughout the lungs bilaterally. IMPRESSION: 1. No complication after right thoracentesis. Trace residual pleural effusion. 2. Stable left upper lobe nodularity with fiduciary markers unchanged. Electronically Signed   By: Randa Ngo M.D.   On: 11/29/2020 15:36     ASSESSMENT/PLAN:  This is a very pleasant 75 year old African-American female diagnosed with stage IIb (T3, N0, M0) non-small cell lung cancer, adenocarcinoma.  This involves the right upper lobe with 2 nodules.  The patient also has evidence of adenocarcinoma in the left upper lobe and subcentimeter nodules in the right lung concerning for stage IV with multifocal disease bilaterally.  She was diagnosed in December 2021.  Her PD-L1 expression is 20%.  Her molecular studies by foundation 1 is negative for any actionable mutations.  The patient previously underwent a right upper lobectomy lymph node dissection on August 26, 2020 under the care of Dr. Kipp Brood.  The patient is currently undergoing systemic chemotherapy with cisplatin 75 mg per metered squared and Alimta 500 mg per metered squared every 3 weeks.  She is status post 2 cycles   Labs are reviewed. Her WBC count is 2.0 today. Her ANC is 0.9. Denies signs or symptoms of infection today. I reviewed her labs with Dr. Julien Nordmann. He recommends delaying her treatment by 1 week. She is scheduled for a pleurx catheter next week on 12/18/20. Dr. Julien Nordmann states this is ok to proceed with this procedure as planned the day following her expected next cycle of chemotherapy.    We will see her back for follow-up visit in 1 week for evaluation before starting cycle #3.  Her magnesium is low at 1.4. I instructed her to take magnesium oxide TID. For her nausea, I sent a prescription for zofran. Reviewed how to take her anti-emetics. I discussed that she can take zofran for delayed nausea starting 3 days after chemotherapy and may alternate with compazine for better control if needed.   The patient was advised to call immediately if she has any concerning symptoms in the interval. The patient voices understanding of current disease status and treatment options and is in agreement with the current care plan. All  questions were answered. The patient knows to call the clinic with any problems, questions or concerns. We can certainly see the patient much sooner if necessary     No orders of the defined types were placed in this encounter.    I spent 20-29 minutes in this encounter.   Sherelle Castelli L Malika Demario, PA-C 12/06/20

## 2020-12-09 ENCOUNTER — Ambulatory Visit: Payer: Medicare HMO | Admitting: Pulmonary Disease

## 2020-12-09 ENCOUNTER — Other Ambulatory Visit: Payer: Self-pay

## 2020-12-09 ENCOUNTER — Telehealth: Payer: Self-pay | Admitting: Pulmonary Disease

## 2020-12-09 ENCOUNTER — Encounter: Payer: Self-pay | Admitting: Pulmonary Disease

## 2020-12-09 VITALS — BP 138/82 | HR 66 | Temp 97.5°F | Ht 64.0 in | Wt 143.0 lb

## 2020-12-09 DIAGNOSIS — C3491 Malignant neoplasm of unspecified part of right bronchus or lung: Secondary | ICD-10-CM

## 2020-12-09 DIAGNOSIS — J90 Pleural effusion, not elsewhere classified: Secondary | ICD-10-CM

## 2020-12-09 DIAGNOSIS — Z87891 Personal history of nicotine dependence: Secondary | ICD-10-CM

## 2020-12-09 DIAGNOSIS — C3492 Malignant neoplasm of unspecified part of left bronchus or lung: Secondary | ICD-10-CM

## 2020-12-09 DIAGNOSIS — Z902 Acquired absence of lung [part of]: Secondary | ICD-10-CM

## 2020-12-09 DIAGNOSIS — J432 Centrilobular emphysema: Secondary | ICD-10-CM

## 2020-12-09 MED FILL — Fosaprepitant Dimeglumine For IV Infusion 150 MG (Base Eq): INTRAVENOUS | Qty: 5 | Status: AC

## 2020-12-09 MED FILL — Dexamethasone Sodium Phosphate Inj 100 MG/10ML: INTRAMUSCULAR | Qty: 1 | Status: AC

## 2020-12-09 NOTE — Telephone Encounter (Signed)
Please schedule the following:  Diagnosis: Recurrent pleural effusion  Which side if for nodule / mass? Right  Procedure: indwelling pleural catheter placement (Pleurex)   Has patient been spoken to by Provider and given informed consent? yes Anesthesia: Topical  Do you need Fluro? no Time needed for procedure: 30 mins Date: 12/18/2020 Alternate Date: no  Time:  PM Location: [facility]  Does patient have OSA?  DM?  Or Latex allergy?  Medication Restriction: no Anticoagulate/Antiplatelet: no Pre-op Labs Ordered: none Imaging request: none   Please coordinate Pre-op COVID Testing

## 2020-12-09 NOTE — Progress Notes (Signed)
Ultrasound chest procedure Note  OLIVIAGRACE CRISANTI  [id number]  07/08/1946  Date:12/09/20  Time:3:49 PM   Provider Performing:Myrla Malanowski L Nesbit Michon   Procedure: Ultrasound chest  Indication(s) Pleural Effusion  Consent Risks of the procedure as well as the alternatives and risks of each were explained to the patient and/or caregiver.  Consent for the procedure was obtained and is signed in the bedside chart  Anesthesia Not applicable  Time Out Verified patient identification, verified procedure, site/side was marked, verified correct patient position, special equipment/implants available, medications/allergies/relevant history reviewed, required imaging and test results available.  Sterile Technique Not applicable  Procedure Description Ultrasound was used to identify appropriate pleural anatomy we were able to visualize recurrent right-sided pleural effusion.  Free-flowing no evidence of loculations or fibrin deposition.  Normal movement of the diaphragm normal sliding lung up with the right chest. however there is basilar reaccumulation of pleural fluid.  Complications/Tolerance None; patient tolerated the procedure well.  EBL 0  Specimen(s) None   [name], DO [facility] 12/09/2020 3:53 PM

## 2020-12-09 NOTE — Patient Instructions (Addendum)
Thank you for visiting Dr. Valeta Harms at [facility]. Today we recommend the following:  Orders Placed This Encounter  Procedures  . Procedural/ Surgical Case Request: INSERTION PLEURAL DRAINAGE CATHETER  . Ambulatory referral to Pulmonology   Return in about 4 weeks (around 01/06/2021).    Please do your part to reduce the spread of COVID-19.   Indwelling Pleural Catheter Home Guide  An indwelling pleural catheter is a thin, flexible tube that is inserted under your skin and into your chest. The catheter drains excess fluid that collects in the area between the chest wall and the lungs (pleural space).After the catheter is inserted, it can be attached to a bottle that collects fluid. The pleural catheter will allow you to drain fluid from your chest at home on a regular basis (sometimes daily). This will eliminate the need for frequent visits to the hospital or clinic to drain the fluid. The catheter may be removed after the excess fluid problem is resolved, usually after 2-3 months. It is important to follow instructions from your health care provider about how to drain and care for your catheter. What are the risks? Generally, this is a safe procedure. However, problems may occur, including:  Infection.  Skin damage around the catheter.  Lung damage.  Failure of the chest tube to work properly.  Spreading of cancer cells along the catheter, if you have cancer. Supplies needed:  Vacuum-sealed drainage bottle with attached drainage line.  Sterile dressing.  Sterile alcohol pads.  Sterile gloves.  Valve cap.  Sterile gauze pads, 4  4 inch (10 cm  10 cm).  Tape.  Adhesive dressing.  Sterile foam catheter pad. How to care for your catheter and insertion site  Wash your hands with soap and warm water before and after touching the catheter or insertion site. If soap and water are not available, use hand sanitizer.  Check your bandage (dressing) daily to make sure it  is clean and dry.  Keep the skin around the catheter clean and dry.  Check the catheter regularly for any cracks or kinks in the tubing.  Check your catheter insertion site every day for signs of infection. Check for: ? Skin breakdown. ? Redness, swelling, or pain. ? Fluid or blood. ? Warmth. ? Pus or a bad smell. How to drain your catheter You may need to drain your catheter every day, or more or less often as told by your health care provider. Follow instructions from your health care provider about how to drain your catheter. You may also refer to instructions that come with the drainage system. To drain the catheter: 1. Wash your hands with soap and warm water. If soap and water are not available, use hand sanitizer. 2. Carefully remove the dressing from around the catheter. 3. Wash your hands again. 4. Put on the gloves provided. 5. Prepare the vacuum-sealed drainage bottle and drainage line. Close the drainage line of the vacuum-sealed drainage bottle by squeezing the pinch clamp or rolling the wheel of the roller clamp toward the bottle. The vacuum in the bottle will be lost if the line is not closed completely. 6. Remove the access tip cover from the drainage line. Do not touch the end. Set it on a sterile surface. 7. Remove the catheter valve cap and throw it away. 8. Use an alcohol pad to clean the end of the catheter. 9. Insert the access tip into the catheter valve. Make sure the valve and access tip are securely connected. Listen  for a click to confirm that they are connected. 10. Insert the T plunger to break the vacuum seal on the drainage bottle. 11. Open the clamp on the drainage line. 12. Allow the catheter to drain. Keep the catheter and the drainage bottle below the level of your chest. There may be a one-way valve on the end of the tubing that will allow liquid and air to flow out of the catheter without letting air inside. 13. Drain the amount of fluid as told by your  health care provider. It usually takes 5-15 minutes. Do not drain more than 1000 mL of fluid. You may feel a little discomfort while you are draining. If the pain is severe, stop draining and contact your health care provider. 14. After you finish draining the catheter, remove the drainage bottle tubing from the catheter. 15. Use a clean alcohol pad to wipe the catheter tip. 16. Place a clean cap on the end of the catheter. 17. Use an alcohol pad to clean the skin around the catheter. 18. Allow the skin to air-dry. 19. Put the catheter pad on your skin. Curl the catheter into loops and place it on the pad. Do not place the catheter on your skin. 20. Replace the dressing over the catheter. 21. Discard the drainage bottle as instructed by your health care provider. Do not reuse the drainage bottle. How to change your dressing Change your dressing at least once a week, or more often if needed to keep the dressing dry. Be sure to change the dressing whenever it becomes moist. Your health care provider will tell you how often to change your dressing. 1. Wash your hands with soap and warm water. If soap and water are not available, use hand sanitizer. 2. Gently remove the old dressing. Avoid using scissors to remove the dressing. Sharp objects may damage the catheter. 3. Wash the skin around the insertion site with mild, fragrance-free soap and warm water. Rinse well, then pat the area dry with a clean cloth. 4. Check the skin around the catheter for signs of infection. Check for: ? Skin breakdown. ? Redness, swelling, or pain. ? Fluid or blood. ? Warmth. ? Pus or a bad smell. 5. If your catheter was stitched (sutured) to your skin, look at the suture to make sure it is still anchored in your skin. 6. Do not apply creams, ointments, or alcohol to the area. Let your skin air-dry completely before you apply a new dressing. 7. Curl the catheter into loops and place it on the sterile catheter pad. Do not  place the catheter on your skin. 8. If you do not have a pad, use a clean dressing. Slide the dressing under the disk that holds the drainage catheter in place. 9. Use gauze to cover the catheter and the catheter pad. The catheter should rest on the pad or dressing, not on your skin. 10. Tape the dressing to your skin. You may be instructed to use an adhesive dressing covering instead of gauze and tape. 11. Wash your hands with soap and warm water. If soap and water are not available, use hand sanitizer. General recommendations  Always wash your hands with soap and warm water before and after caring for your catheter and drainage bottle. Use a mild, fragrance-free soap. If soap and water are not available, use hand sanitizer.  Always make sure there are no leaks in the catheter or drainage bottle.  Each time you drain the catheter, note the color and  amount of fluid.  Do not touch the tip of the catheter or the drainage bottle tubing.  Do not reuse drainage bottles.  Do not take baths, swim, or use a hot tub until your health care provider approves. Ask your health care provider if you may take showers. You may only be allowed to take sponge baths.  Take deep breaths regularly, followed by a cough. Doing this can help to prevent lung infection. Contact a health care provider if:  You have any questions about caring for your catheter or drainage bottle.  You still have pain at the catheter insertion site more than 2 days after your procedure.  You have pain while draining your catheter.  Your catheter becomes bent, twisted, or cracked.  The connection between the catheter and the collection bottle becomes loose.  You have any of these around your catheter insertion site or coming from it: ? Skin breakdown. ? Redness, swelling, or pain. ? Fluid or blood. ? Warmth. ? Pus or a bad smell. Get help right away if:  You have a fever or chills.  You have chest pain.  You have  dizziness or shortness of breath.  You have severe redness, swelling, or pain at your catheter insertion site.  The catheter comes out.  The catheter is blocked or clogged. Summary  An indwelling pleural catheter is a thin, flexible tube that is inserted under your skin and into your chest. The catheter drains excess fluid that collects in the area between the chest wall and the lungs (pleural space).  It is important to follow instructions from your health care provider about how to drain and care for your catheter.  Do not touch the tip of the catheter or the drainage bottle tubing.  Always wash your hands with soap and water before and after caring for your catheter and drainage bottle. If soap and water are not available, use hand sanitizer.  This information is not intended to replace advice given to you by your health care provider. Make sure you discuss any questions you have with your health care provider. Document Revised: 06/24/2020 Document Reviewed: 04/25/2020 [employer] Patient Education  2021 [employer].

## 2020-12-09 NOTE — H&P (View-Only) (Signed)
Synopsis: Referred in Oct 2021 for lung nodule by Lin Landsman, MD  Subjective:   PATIENT ID: Tonya [name] GENDER: female DOB: 1946/04/14, MRN: [id number]  Chief Complaint  Patient presents with  . Follow-up    A lot of nausea, random times throughout the day and night    This is a 75 year old female, former smoker, quit last week, smoked for 60+ years at approximately 1/3 to 1/2 pack/day.  She recently went to the emergency department for evaluation for chest pain.  She was referred to cardiology.  She had a coronary calcium CT scan that was completed at [facility] imaging.  Record available for review in epic care everywhere.  This was completed on 06/11/2020.  This revealed a 1.8 cm spiculated nodule in the right upper lobe concerning for primary bronchogenic carcinoma.  I am unable to review the images today but I can review the report.  I discussed this with the patient today in the office in detail.  As for her symptoms her chest pain has since resolved.  She also had an echocardiogram completed recently with grade 1 diastolic dysfunction.  She is following with Dr. Fraser Din from [facility] cardiology.  From a respiratory standpoint she does have some dyspnea on exertion.  She is not had any previous maintenance inhalers for COPD.  She does have evidence emphysema on her coronary CT scan as per her report.  No family history of lung cancer..  OV 07/11/2020: Patient here today for follow-up after recent nuclear medicine pet imaging.  As well as super D CT imaging.  Patient found to have a new left upper lobe lesion as well as a right upper lobe lesion.  The new left upper lobe lesion was found in this process of work-up.  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  We discussed this today in the office.  Patient is very anxious about all of this new information.  Denies fevers night sweats weight loss.  OV 08/22/2020: Patient here today for follow-up after bronchoscopy and surgery  consultation.  Surgery consultation reviewed and discussed with Dr. Kipp Brood.  Actually spoke with Dr. Kipp Brood this morning to discuss case.  Patient has plans for resection of right-sided malignancy.  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  MRI of the brain negative results reviewed today in the office.  Patient has plans for resection of the right side and likely SBRT to the left.  Patient is somewhat anxious for her planned procedure but is ready to get this process moving forward and over with.  OV 10/17/2020: 75 year old here for follow-up.  On 08/26/2020 patient underwent right upper lobe lobectomy.  Pathology with invasive multifocal adenocarcinoma.  There was involvement of the pleura.  Patient has subsequently seen medical oncology.  She also has a lower lobe lung cancer of the left side also adenocarcinoma.  Patient had postoperative chest x-ray which revealed a pleural effusion.  Patient was referred to interventional radiology.  Had thoracentesis on 10/10/2020.  Pathology shows atypical cells there was a few cells that stained with TTF-1.  No overt call on malignancy within the pleural fluid.  Reviewed path results today in the office with patient.  Also on exam had no breath sounds in the right side and ultrasound of the right chest shows reaccumulation of the right-sided pleural effusion.  Patient does have some shortness of breath.  She is anxious about all the things that have been changing around her.  Patient denies fever chills night sweats weight  loss.  OV 12/09/2020: Here today for follow-up after recent outpatient thoracentesis.  Patient has recurrent right-sided pleural effusion.  From respiratory standpoint she is feeling better however has had this effusion tapped several times since last office visit.  Today we discussed recurrence of effusion and how we would manage this.  She is anxious about potentially having a repeat  procedure, surgery or indwelling pleural catheter placement.   Past Medical History:  Diagnosis Date  . Adenoma of left adrenal gland   . Cancer ([place])    Lung  . Colon polyps    hyperplastic  . Complication of anesthesia    Very emotional and cries after anesthesia  . GERD (gastroesophageal reflux disease)   . Kidney stone   . Liver lesion   . Microhematuria      Family History  Problem Relation Age of Onset  . Arrhythmia Mother        s/p Pacer   . Heart attack Father   . Heart disease Sister   . Colon cancer Neg Hx      Past Surgical History:  Procedure Laterality Date  . ABDOMINAL HYSTERECTOMY    . APPENDECTOMY    . BRONCHIAL BIOPSY  07/18/2020   Procedure: BRONCHIAL BIOPSIES;  Surgeon: [name], DO;  Location: [place] [facility];  Service: Pulmonary;;  . BRONCHIAL BRUSHINGS  07/18/2020   Procedure: BRONCHIAL BRUSHINGS;  Surgeon: [name], DO;  Location: [place] [facility];  Service: Pulmonary;;  . BRONCHIAL NEEDLE ASPIRATION BIOPSY  07/18/2020   Procedure: BRONCHIAL NEEDLE ASPIRATION BIOPSIES;  Surgeon: [name], DO;  Location: [place] [facility];  Service: Pulmonary;;  . BRONCHIAL WASHINGS  07/18/2020   Procedure: BRONCHIAL WASHINGS;  Surgeon: [name], DO;  Location: [place] [facility];  Service: Pulmonary;;  . FIDUCIAL MARKER PLACEMENT  07/18/2020   Procedure: FIDUCIAL MARKER PLACEMENT;  Surgeon: [name], DO;  Location: [place] [facility];  Service: Pulmonary;;  . INTERCOSTAL NERVE BLOCK Right 08/26/2020   Procedure: INTERCOSTAL NERVE BLOCK;  Surgeon: [name], MD;  Location: [place];  Service: Thoracic;  Laterality: Right;  . IR THORACENTESIS ASP PLEURAL SPACE W/IMG GUIDE  10/10/2020  . NODE DISSECTION Right 08/26/2020   Procedure: NODE DISSECTION;  Surgeon: [name], MD;  Location: [place];  Service: Thoracic;  Laterality: Right;  . THORACENTESIS Right 11/04/2020   Procedure: THORACENTESIS;  Surgeon: [name], DO;  Location:  [facility] END[facility]COPY;  Service: Pulmonary;  Laterality: Right;  . THORACENTESIS Right 11/29/2020   Procedure: THORACENTESIS;  Surgeon: [name], DO;  Location: [facility] ENDOSCOPY;  Service: Pulmonary;  Laterality: Right;  Marland Kitchen VIDEO BRONCHOSCOPY WITH ENDOBRONCHIAL NAVIGATION N/A 07/18/2020   Procedure: VIDEO BRONCHOSCOPY WITH ENDOBRONCHIAL NAVIGATION;  Surgeon: [name], DO;  Location: [place];  Service: Pulmonary;  Laterality: N/A;  . VIDEO BRONCHOSCOPY WITH ENDOBRONCHIAL ULTRASOUND  07/18/2020   Procedure: VIDEO BRONCHOSCOPY WITH ENDOBRONCHIAL ULTRASOUND;  Surgeon: [name], DO;  Location: [place] [facility];  Service: Pulmonary;;    Social History   Socioeconomic History  . Marital status: Married    Spouse name: Not on file  . Number of children: 3  . Years of education: Not on file  . Highest education level: Not on file  Occupational History  . Not on file  Tobacco Use  . Smoking status: Former Smoker    Packs/day: 0.30    Years: 60.00    Pack years: 18.00    Types: Cigarettes    Quit date: 06/11/2020  Years since quitting: 0.4  . Smokeless tobacco: Never Used  Vaping Use  . Vaping Use: Never used  Substance and Sexual Activity  . Alcohol use: Yes    Comment: 0.5 drink per month  . Drug use: No  . Sexual activity: Not Currently    Birth control/protection: Surgical    Comment: Hysterectomy  Other Topics Concern  . 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    Social Determinants of Health   Financial Resource Strain: Not on file  Food Insecurity: Not on file  Transportation Needs: Not on file  Physical Activity: Not on file  Stress: Not on file  Social Connections: Not on file  Intimate Partner Violence: Not on file     Allergies  Allergen Reactions  . Other Anaphylaxis    Spiders  . Penicillins Hives    REACTION: 20 years     Outpatient Medications Prior to Visit  Medication Sig Dispense Refill  . acetaminophen  (TYLENOL) 500 MG tablet Take 2 tablets (1,000 mg total) by mouth every 6 (six) hours as needed. (Patient taking differently: Take 1,000 mg by mouth every 6 (six) hours as needed for mild pain or moderate pain.) 30 tablet 0  . albuterol (VENTOLIN HFA) 108 (90 Base) MCG/ACT inhaler Inhale 1-2 puffs into the lungs every 4 (four) hours as needed for wheezing or shortness of breath.    Marland Kitchen alendronate (FOSAMAX) 70 MG tablet Take 70 mg by mouth every Saturday.    . ALPRAZolam (XANAX) 1 MG tablet Take 1 mg by mouth 3 (three) times daily as needed for sleep.    . Calcium Carbonate-Vitamin D (CALCIUM + D PO) Take 1 tablet by mouth 2 (two) times daily.    . cyclobenzaprine (FLEXERIL) 5 MG tablet Take 5 mg by mouth 3 (three) times daily as needed for muscle spasms (sciatic nerve).     Marland Kitchen dexamethasone (DECADRON) 2 MG tablet Take 4 mg by mouth See admin instructions. Take 4 mg mouth twice daily the day before, day of and 4 days after  the chemotherapy every 3 weeks.    . folic acid (FOLVITE) 1 MG tablet Take 1 tablet (1 mg total) by mouth daily. (Patient taking differently: Take 1 mg by mouth daily at 12 noon.) 30 tablet 4  . guaiFENesin (MUCINEX) 600 MG 12 hr tablet Take 2 tablets (1,200 mg total) by mouth 2 (two) times daily as needed. (Patient taking differently: Take 1,200 mg by mouth 2 (two) times daily as needed for to loosen phlegm.)    . ibuprofen (ADVIL) 800 MG tablet Take 1 tablet (800 mg total) by mouth every 8 (eight) hours as needed for moderate pain. 30 tablet 0  . Lidocaine 4 % PTCH Place 1 patch onto the skin daily as needed (sciatic nerve pain).    . magnesium oxide (MAG-OX) 400 (241.3 Mg) MG tablet Take 1 tablet (400 mg total) by mouth 2 (two) times daily. 60 tablet 1  . naproxen (NAPROSYN) 375 MG tablet Take 1 tablet (375 mg total) by mouth 2 (two) times daily as needed for moderate pain. 8 tablet 0  . omeprazole (PRILOSEC) 40 MG capsule Take 40 mg by mouth 2 (two) times daily.    Marland Kitchen  oxyCODONE-acetaminophen (PERCOCET) 10-325 MG tablet Take 1 tablet by mouth every 8 (eight) hours as needed (sciatic nerve pain).    . pregabalin (LYRICA) 25 MG capsule Take 1 capsule (25 mg total) by mouth 2 (two)  times daily. 60 capsule 0  . prochlorperazine (COMPAZINE) 10 MG tablet TAKE 1 TABLET(10 MG) BY MOUTH EVERY 6 HOURS AS NEEDED FOR NAUSEA OR VOMITING (Patient taking differently: Take 10 mg by mouth every 6 (six) hours as needed for nausea or vomiting.) 30 tablet 0  . rosuvastatin (CRESTOR) 10 MG tablet Take 10 mg by mouth at bedtime.    . Tiotropium Bromide-Olodaterol (STIOLTO RESPIMAT) 2.5-2.5 MCG/ACT AERS Inhale 2 puffs into the lungs daily. (Patient not taking: Reported on 12/09/2020) 4 g 0   No facility-administered medications prior to visit.    Review of Systems  Constitutional: Positive for malaise/fatigue. Negative for chills, fever and weight loss.  HENT: Negative for hearing loss, sore throat and tinnitus.   Eyes: Negative for blurred vision and double vision.  Respiratory: Positive for shortness of breath. Negative for cough, hemoptysis, sputum production, wheezing and stridor.   Cardiovascular: Negative for chest pain, palpitations, orthopnea, leg swelling and PND.  Gastrointestinal: Negative for abdominal pain, constipation, diarrhea, heartburn, nausea and vomiting.  Genitourinary: Negative for dysuria, hematuria and urgency.  Musculoskeletal: Negative for joint pain and myalgias.  Skin: Negative for itching and rash.  Neurological: Negative for dizziness, tingling, weakness and headaches.  Endo/Heme/Allergies: Negative for environmental allergies. Does not bruise/bleed easily.  Psychiatric/Behavioral: Negative for depression. The patient is not nervous/anxious and does not have insomnia.   All other systems reviewed and are negative.    Objective:  Physical Exam Vitals reviewed.  Constitutional:      General: She is not in acute distress.    Appearance: She is  well-developed.  HENT:     Head: Normocephalic and atraumatic.  Eyes:     General: No scleral icterus.    Conjunctiva/sclera: Conjunctivae normal.     Pupils: Pupils are equal, round, and reactive to light.  Neck:     Vascular: No JVD.     Trachea: No tracheal deviation.  Cardiovascular:     Rate and Rhythm: Normal rate and regular rhythm.     Heart sounds: Normal heart sounds. No murmur heard.   Pulmonary:     Effort: Pulmonary effort is normal. No tachypnea, accessory muscle usage or respiratory distress.     Breath sounds: No stridor. No wheezing, rhonchi or rales.  Abdominal:     General: Bowel sounds are normal. There is no distension.     Palpations: Abdomen is soft.     Tenderness: There is no abdominal tenderness.  Musculoskeletal:        General: No tenderness.     Cervical back: Neck supple.  Lymphadenopathy:     Cervical: No cervical adenopathy.  Skin:    General: Skin is warm and dry.     Capillary Refill: Capillary refill takes less than 2 seconds.     Findings: No rash.  Neurological:     Mental Status: She is alert and oriented to person, place, and time.  Psychiatric:        Behavior: Behavior normal.      Vitals:   12/09/20 0958  BP: 138/82  Pulse: 66  Temp: (!) 97.5 F (36.4 C)  SpO2: 98%  Weight: 143 lb (64.9 kg)  Height: 5\' 4"  (1.626 m)   98% on RA BMI Readings from Last 3 Encounters:  12/09/20 24.55 kg/m  11/29/20 23.84 kg/m  11/26/20 23.86 kg/m   Wt Readings from Last 3 Encounters:  12/09/20 143 lb (64.9 kg)  11/29/20 138 lb 14.2 oz (63 kg)  11/26/20 139 lb (63  kg)     CBC    Component Value Date/Time   WBC 3.8 (L) 12/03/2020 0859   WBC 10.8 (H) 08/28/2020 0140   RBC 3.25 (L) 12/03/2020 0859   HGB 10.1 (L) 12/03/2020 0859   HCT 31.4 (L) 12/03/2020 0859   PLT 151 12/03/2020 0859   MCV 96.6 12/03/2020 0859   MCH 31.1 12/03/2020 0859   MCHC 32.2 12/03/2020 0859   RDW 12.6 12/03/2020 0859   LYMPHSABS 1.4 12/03/2020 0859    MONOABS 0.3 12/03/2020 0859   EOSABS 0.0 12/03/2020 0859   BASOSABS 0.0 12/03/2020 0859     Chest Imaging: 06/11/2020 coronary calcium CT scan images: Report reviewed in epic Care Everywhere A right upper lobe 1.8 cm lung nodule concerning for primary bronchogenic carcinoma. The patient's images have been independently reviewed by me.    Pulmonary Functions Testing Results: PFT Results Latest Ref Rng & Units 06/24/2020  FVC-Pre L 1.59  FVC-Predicted Pre % 69  FVC-Post L 1.89  FVC-Predicted Post % 82  Pre FEV1/FVC % % 67  Post FEV1/FCV % % 68  FEV1-Pre L 1.06  FEV1-Predicted Pre % 59  FEV1-Post L 1.28  DLCO uncorrected ml/min/mmHg 10.29  DLCO UNC% % 53  DLVA Predicted % 85  TLC L 4.78  TLC % Predicted % 94  RV % Predicted % 142    FeNO:   Pathology:   08/26/2020 RUL Lobectomy:  . LUNG, RIGHT UPPER LOBE, LOBECTOMY:  - Multifocal invasive adenocarcinoma, moderately differentiated, 1.4 cm  - Carcinoma involves the pleura  - Margins uninvolved by adenocarcinoma  - See oncology table and comment below   10/10/2020:  Specimen Submitted: A. PLEURAL FLUID, RIGHT, THORACENTESIS:  FINAL MICROSCOPIC DIAGNOSIS:  - Atypical cells present  SPECIMEN ADEQUACY:  Satisfactory for evaluation  DIAGNOSTIC COMMENTS:  IHC for TTF-1 identifies rare cells morphologically indistinct from cell  block material. D2-40 is positive in mesothelial cells. CD163 is  positive in histiocytes. MOC31 is negative. Should fluid  re-accumulate, cytology may be helpful  Echocardiogram:   Heart Catheterization:     Assessment & Plan:     ICD-10-CM   1. Recurrent pleural effusion on right  J90 Ambulatory referral to Pulmonology    Procedural/ Surgical Case Request: INSERTION PLEURAL DRAINAGE CATHETER  2. Adenocarcinoma of lung, stage IIb, right (HCC)  C34.91   3. Adenocarcinoma of left lung, stage 1 (HCC)  C34.92   4. Former smoker  Z87.891   5. Centrilobular emphysema ([place])  J43.2    6. S/P lobectomy of lung  Z90.2     Discussion:  This is a 75 year old female, stage IIb, N0, M0 adenocarcinoma of the right upper lobe, stage I left lower lobe lung cancer status post right upper lobectomy, plans for chemotherapy adjuvant followed by radiation to the left.  Now has recurrent right-sided pleural effusion.  Has been tapped several times.  Cytology was negative for recurrence of malignancy.  Former smoker 60+ pack year history, currently COPD managed with Stiolto.  Plan: We will plan for indwelling pleural catheter placement. We discussed the risk benefits and alternatives of procedure today in the office. She has had multiple taps. I think try to keep the pleural space dry and drained daily will have a greater chance at auto pleurodesis. Continue Stiolto Continue albuterol as needed Tentative plan will be for Pleurx catheter placement on 12/18/2020.    Current Outpatient Medications:  .  acetaminophen (TYLENOL) 500 MG tablet, Take 2 tablets (1,000 mg total) by mouth every  6 (six) hours as needed. (Patient taking differently: Take 1,000 mg by mouth every 6 (six) hours as needed for mild pain or moderate pain.), Disp: 30 tablet, Rfl: 0 .  albuterol (VENTOLIN HFA) 108 (90 Base) MCG/ACT inhaler, Inhale 1-2 puffs into the lungs every 4 (four) hours as needed for wheezing or shortness of breath., Disp: , Rfl:  .  alendronate (FOSAMAX) 70 MG tablet, Take 70 mg by mouth every Saturday., Disp: , Rfl:  .  ALPRAZolam (XANAX) 1 MG tablet, Take 1 mg by mouth 3 (three) times daily as needed for sleep., Disp: , Rfl:  .  Calcium Carbonate-Vitamin D (CALCIUM + D PO), Take 1 tablet by mouth 2 (two) times daily., Disp: , Rfl:  .  cyclobenzaprine (FLEXERIL) 5 MG tablet, Take 5 mg by mouth 3 (three) times daily as needed for muscle spasms (sciatic nerve). , Disp: , Rfl:  .  dexamethasone (DECADRON) 2 MG tablet, Take 4 mg by mouth See admin instructions. Take 4 mg mouth twice daily the day  before, day of and 4 days after  the chemotherapy every 3 weeks., Disp: , Rfl:  .  folic acid (FOLVITE) 1 MG tablet, Take 1 tablet (1 mg total) by mouth daily. (Patient taking differently: Take 1 mg by mouth daily at 12 noon.), Disp: 30 tablet, Rfl: 4 .  guaiFENesin (MUCINEX) 600 MG 12 hr tablet, Take 2 tablets (1,200 mg total) by mouth 2 (two) times daily as needed. (Patient taking differently: Take 1,200 mg by mouth 2 (two) times daily as needed for to loosen phlegm.), Disp: , Rfl:  .  ibuprofen (ADVIL) 800 MG tablet, Take 1 tablet (800 mg total) by mouth every 8 (eight) hours as needed for moderate pain., Disp: 30 tablet, Rfl: 0 .  Lidocaine 4 % PTCH, Place 1 patch onto the skin daily as needed (sciatic nerve pain)., Disp: , Rfl:  .  magnesium oxide (MAG-OX) 400 (241.3 Mg) MG tablet, Take 1 tablet (400 mg total) by mouth 2 (two) times daily., Disp: 60 tablet, Rfl: 1 .  naproxen (NAPROSYN) 375 MG tablet, Take 1 tablet (375 mg total) by mouth 2 (two) times daily as needed for moderate pain., Disp: 8 tablet, Rfl: 0 .  omeprazole (PRILOSEC) 40 MG capsule, Take 40 mg by mouth 2 (two) times daily., Disp: , Rfl:  .  oxyCODONE-acetaminophen (PERCOCET) 10-325 MG tablet, Take 1 tablet by mouth every 8 (eight) hours as needed (sciatic nerve pain)., Disp: , Rfl:  .  pregabalin (LYRICA) 25 MG capsule, Take 1 capsule (25 mg total) by mouth 2 (two) times daily., Disp: 60 capsule, Rfl: 0 .  prochlorperazine (COMPAZINE) 10 MG tablet, TAKE 1 TABLET(10 MG) BY MOUTH EVERY 6 HOURS AS NEEDED FOR NAUSEA OR VOMITING (Patient taking differently: Take 10 mg by mouth every 6 (six) hours as needed for nausea or vomiting.), Disp: 30 tablet, Rfl: 0 .  rosuvastatin (CRESTOR) 10 MG tablet, Take 10 mg by mouth at bedtime., Disp: , Rfl:  .  Tiotropium Bromide-Olodaterol (STIOLTO RESPIMAT) 2.5-2.5 MCG/ACT AERS, Inhale 2 puffs into the lungs daily. (Patient not taking: Reported on 12/09/2020), Disp: 4 g, Rfl: 0  I spent 42 minutes  dedicated to the care of this patient on the date of this encounter to include pre-visit review of records, face-to-face time with the patient discussing conditions above, post visit ordering of testing, clinical documentation with the electronic health record, making appropriate referrals as documented, and communicating necessary findings to members of the patients care  team.    [name], DO [facility] 12/09/2020 10:19 AM

## 2020-12-09 NOTE — Progress Notes (Signed)
Synopsis: Referred in Oct 2021 for lung nodule by Lin Landsman, MD  Subjective:   PATIENT ID: Tonya [name] GENDER: female DOB: 04/08/1946, MRN: [id number]  Chief Complaint  Patient presents with  . Follow-up    A lot of nausea, random times throughout the day and night    This is a 75 year old female, former smoker, quit last week, smoked for 60+ years at approximately 1/3 to 1/2 pack/day.  She recently went to the emergency department for evaluation for chest pain.  She was referred to cardiology.  She had a coronary calcium CT scan that was completed at [facility] imaging.  Record available for review in epic care everywhere.  This was completed on 06/11/2020.  This revealed a 1.8 cm spiculated nodule in the right upper lobe concerning for primary bronchogenic carcinoma.  I am unable to review the images today but I can review the report.  I discussed this with the patient today in the office in detail.  As for her symptoms her chest pain has since resolved.  She also had an echocardiogram completed recently with grade 1 diastolic dysfunction.  She is following with Dr. Fraser Din from [facility] cardiology.  From a respiratory standpoint she does have some dyspnea on exertion.  She is not had any previous maintenance inhalers for COPD.  She does have evidence emphysema on her coronary CT scan as per her report.  No family history of lung cancer..  OV 07/11/2020: Patient here today for follow-up after recent nuclear medicine pet imaging.  As well as super D CT imaging.  Patient found to have a new left upper lobe lesion as well as a right upper lobe lesion.  The new left upper lobe lesion was found in this process of work-up.  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  We discussed this today in the office.  Patient is very anxious about all of this new information.  Denies fevers night sweats weight loss.  OV 08/22/2020: Patient here today for follow-up after bronchoscopy and surgery  consultation.  Surgery consultation reviewed and discussed with Dr. Kipp Brood.  Actually spoke with Dr. Kipp Brood this morning to discuss case.  Patient has plans for resection of right-sided malignancy.  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  MRI of the brain negative results reviewed today in the office.  Patient has plans for resection of the right side and likely SBRT to the left.  Patient is somewhat anxious for her planned procedure but is ready to get this process moving forward and over with.  OV 10/17/2020: 75 year old here for follow-up.  On 08/26/2020 patient underwent right upper lobe lobectomy.  Pathology with invasive multifocal adenocarcinoma.  There was involvement of the pleura.  Patient has subsequently seen medical oncology.  She also has a lower lobe lung cancer of the left side also adenocarcinoma.  Patient had postoperative chest x-ray which revealed a pleural effusion.  Patient was referred to interventional radiology.  Had thoracentesis on 10/10/2020.  Pathology shows atypical cells there was a few cells that stained with TTF-1.  No overt call on malignancy within the pleural fluid.  Reviewed path results today in the office with patient.  Also on exam had no breath sounds in the right side and ultrasound of the right chest shows reaccumulation of the right-sided pleural effusion.  Patient does have some shortness of breath.  She is anxious about all the things that have been changing around her.  Patient denies fever chills night sweats weight  loss.  OV 12/09/2020: Here today for follow-up after recent outpatient thoracentesis.  Patient has recurrent right-sided pleural effusion.  From respiratory standpoint she is feeling better however has had this effusion tapped several times since last office visit.  Today we discussed recurrence of effusion and how we would manage this.  She is anxious about potentially having a repeat  procedure, surgery or indwelling pleural catheter placement.   Past Medical History:  Diagnosis Date  . Adenoma of left adrenal gland   . Cancer ([place])    Lung  . Colon polyps    hyperplastic  . Complication of anesthesia    Very emotional and cries after anesthesia  . GERD (gastroesophageal reflux disease)   . Kidney stone   . Liver lesion   . Microhematuria      Family History  Problem Relation Age of Onset  . Arrhythmia Mother        s/p Pacer   . Heart attack Father   . Heart disease Sister   . Colon cancer Neg Hx      Past Surgical History:  Procedure Laterality Date  . ABDOMINAL HYSTERECTOMY    . APPENDECTOMY    . BRONCHIAL BIOPSY  07/18/2020   Procedure: BRONCHIAL BIOPSIES;  Surgeon: [name], DO;  Location: [facility];  Service: Pulmonary;;  . BRONCHIAL BRUSHINGS  07/18/2020   Procedure: BRONCHIAL BRUSHINGS;  Surgeon: [name], DO;  Location: [place] [facility];  Service: Pulmonary;;  . BRONCHIAL NEEDLE ASPIRATION BIOPSY  07/18/2020   Procedure: BRONCHIAL NEEDLE ASPIRATION BIOPSIES;  Surgeon: [name], DO;  Location: [place] [facility];  Service: Pulmonary;;  . BRONCHIAL WASHINGS  07/18/2020   Procedure: BRONCHIAL WASHINGS;  Surgeon: [name], DO;  Location: [place] [facility];  Service: Pulmonary;;  . FIDUCIAL MARKER PLACEMENT  07/18/2020   Procedure: FIDUCIAL MARKER PLACEMENT;  Surgeon: [name], DO;  Location: [facility];  Service: Pulmonary;;  . INTERCOSTAL NERVE BLOCK Right 08/26/2020   Procedure: INTERCOSTAL NERVE BLOCK;  Surgeon: [name], MD;  Location: [place];  Service: Thoracic;  Laterality: Right;  . IR THORACENTESIS ASP PLEURAL SPACE W/IMG GUIDE  10/10/2020  . NODE DISSECTION Right 08/26/2020   Procedure: NODE DISSECTION;  Surgeon: [name], MD;  Location: [place];  Service: Thoracic;  Laterality: Right;  . THORACENTESIS Right 11/04/2020   Procedure: THORACENTESIS;  Surgeon: [name], DO;  Location:  [facility] ENDOSCOPY;  Service: Pulmonary;  Laterality: Right;  . THORACENTESIS Right 11/29/2020   Procedure: THORACENTESIS;  Surgeon: [name], DO;  Location: [facility] ENDOSCOPY;  Service: Pulmonary;  Laterality: Right;  Marland Kitchen VIDEO BRONCHOSCOPY WITH ENDOBRONCHIAL NAVIGATION N/A 07/18/2020   Procedure: VIDEO BRONCHOSCOPY WITH ENDOBRONCHIAL NAVIGATION;  Surgeon: [name], DO;  Location: [place];  Service: Pulmonary;  Laterality: N/A;  . VIDEO BRONCHOSCOPY WITH ENDOBRONCHIAL ULTRASOUND  07/18/2020   Procedure: VIDEO BRONCHOSCOPY WITH ENDOBRONCHIAL ULTRASOUND;  Surgeon: [name], DO;  Location: [place] [facility];  Service: Pulmonary;;    Social History   Socioeconomic History  . Marital status: Married    Spouse name: Not on file  . Number of children: 3  . Years of education: Not on file  . Highest education level: Not on file  Occupational History  . Not on file  Tobacco Use  . Smoking status: Former Smoker    Packs/day: 0.30    Years: 60.00    Pack years: 18.00    Types: Cigarettes    Quit date: 06/11/2020  Years since quitting: 0.4  . Smokeless tobacco: Never Used  Vaping Use  . Vaping Use: Never used  Substance and Sexual Activity  . Alcohol use: Yes    Comment: 0.5 drink per month  . Drug use: No  . Sexual activity: Not Currently    Birth control/protection: Surgical    Comment: Hysterectomy  Other Topics Concern  . 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    Social Determinants of Health   Financial Resource Strain: Not on file  Food Insecurity: Not on file  Transportation Needs: Not on file  Physical Activity: Not on file  Stress: Not on file  Social Connections: Not on file  Intimate Partner Violence: Not on file     Allergies  Allergen Reactions  . Other Anaphylaxis    Spiders  . Penicillins Hives    REACTION: 20 years     Outpatient Medications Prior to Visit  Medication Sig Dispense Refill  . acetaminophen  (TYLENOL) 500 MG tablet Take 2 tablets (1,000 mg total) by mouth every 6 (six) hours as needed. (Patient taking differently: Take 1,000 mg by mouth every 6 (six) hours as needed for mild pain or moderate pain.) 30 tablet 0  . albuterol (VENTOLIN HFA) 108 (90 Base) MCG/ACT inhaler Inhale 1-2 puffs into the lungs every 4 (four) hours as needed for wheezing or shortness of breath.    Marland Kitchen alendronate (FOSAMAX) 70 MG tablet Take 70 mg by mouth every Saturday.    . ALPRAZolam (XANAX) 1 MG tablet Take 1 mg by mouth 3 (three) times daily as needed for sleep.    . Calcium Carbonate-Vitamin D (CALCIUM + D PO) Take 1 tablet by mouth 2 (two) times daily.    . cyclobenzaprine (FLEXERIL) 5 MG tablet Take 5 mg by mouth 3 (three) times daily as needed for muscle spasms (sciatic nerve).     Marland Kitchen dexamethasone (DECADRON) 2 MG tablet Take 4 mg by mouth See admin instructions. Take 4 mg mouth twice daily the day before, day of and 4 days after  the chemotherapy every 3 weeks.    . folic acid (FOLVITE) 1 MG tablet Take 1 tablet (1 mg total) by mouth daily. (Patient taking differently: Take 1 mg by mouth daily at 12 noon.) 30 tablet 4  . guaiFENesin (MUCINEX) 600 MG 12 hr tablet Take 2 tablets (1,200 mg total) by mouth 2 (two) times daily as needed. (Patient taking differently: Take 1,200 mg by mouth 2 (two) times daily as needed for to loosen phlegm.)    . ibuprofen (ADVIL) 800 MG tablet Take 1 tablet (800 mg total) by mouth every 8 (eight) hours as needed for moderate pain. 30 tablet 0  . Lidocaine 4 % PTCH Place 1 patch onto the skin daily as needed (sciatic nerve pain).    . magnesium oxide (MAG-OX) 400 (241.3 Mg) MG tablet Take 1 tablet (400 mg total) by mouth 2 (two) times daily. 60 tablet 1  . naproxen (NAPROSYN) 375 MG tablet Take 1 tablet (375 mg total) by mouth 2 (two) times daily as needed for moderate pain. 8 tablet 0  . omeprazole (PRILOSEC) 40 MG capsule Take 40 mg by mouth 2 (two) times daily.    Marland Kitchen  oxyCODONE-acetaminophen (PERCOCET) 10-325 MG tablet Take 1 tablet by mouth every 8 (eight) hours as needed (sciatic nerve pain).    . pregabalin (LYRICA) 25 MG capsule Take 1 capsule (25 mg total) by mouth 2 (two)  times daily. 60 capsule 0  . prochlorperazine (COMPAZINE) 10 MG tablet TAKE 1 TABLET(10 MG) BY MOUTH EVERY 6 HOURS AS NEEDED FOR NAUSEA OR VOMITING (Patient taking differently: Take 10 mg by mouth every 6 (six) hours as needed for nausea or vomiting.) 30 tablet 0  . rosuvastatin (CRESTOR) 10 MG tablet Take 10 mg by mouth at bedtime.    . Tiotropium Bromide-Olodaterol (STIOLTO RESPIMAT) 2.5-2.5 MCG/ACT AERS Inhale 2 puffs into the lungs daily. (Patient not taking: Reported on 12/09/2020) 4 g 0   No facility-administered medications prior to visit.    Review of Systems  Constitutional: Positive for malaise/fatigue. Negative for chills, fever and weight loss.  HENT: Negative for hearing loss, sore throat and tinnitus.   Eyes: Negative for blurred vision and double vision.  Respiratory: Positive for shortness of breath. Negative for cough, hemoptysis, sputum production, wheezing and stridor.   Cardiovascular: Negative for chest pain, palpitations, orthopnea, leg swelling and PND.  Gastrointestinal: Negative for abdominal pain, constipation, diarrhea, heartburn, nausea and vomiting.  Genitourinary: Negative for dysuria, hematuria and urgency.  Musculoskeletal: Negative for joint pain and myalgias.  Skin: Negative for itching and rash.  Neurological: Negative for dizziness, tingling, weakness and headaches.  Endo/Heme/Allergies: Negative for environmental allergies. Does not bruise/bleed easily.  Psychiatric/Behavioral: Negative for depression. The patient is not nervous/anxious and does not have insomnia.   All other systems reviewed and are negative.    Objective:  Physical Exam Vitals reviewed.  Constitutional:      General: She is not in acute distress.    Appearance: She is  well-developed.  HENT:     Head: Normocephalic and atraumatic.  Eyes:     General: No scleral icterus.    Conjunctiva/sclera: Conjunctivae normal.     Pupils: Pupils are equal, round, and reactive to light.  Neck:     Vascular: No JVD.     Trachea: No tracheal deviation.  Cardiovascular:     Rate and Rhythm: Normal rate and regular rhythm.     Heart sounds: Normal heart sounds. No murmur heard.   Pulmonary:     Effort: Pulmonary effort is normal. No tachypnea, accessory muscle usage or respiratory distress.     Breath sounds: No stridor. No wheezing, rhonchi or rales.  Abdominal:     General: Bowel sounds are normal. There is no distension.     Palpations: Abdomen is soft.     Tenderness: There is no abdominal tenderness.  Musculoskeletal:        General: No tenderness.     Cervical back: Neck supple.  Lymphadenopathy:     Cervical: No cervical adenopathy.  Skin:    General: Skin is warm and dry.     Capillary Refill: Capillary refill takes less than 2 seconds.     Findings: No rash.  Neurological:     Mental Status: She is alert and oriented to person, place, and time.  Psychiatric:        Behavior: Behavior normal.      Vitals:   12/09/20 0958  BP: 138/82  Pulse: 66  Temp: (!) 97.5 F (36.4 C)  SpO2: 98%  Weight: 143 lb (64.9 kg)  Height: 5\' 4"  (1.626 m)   98% on RA BMI Readings from Last 3 Encounters:  12/09/20 24.55 kg/m  11/29/20 23.84 kg/m  11/26/20 23.86 kg/m   Wt Readings from Last 3 Encounters:  12/09/20 143 lb (64.9 kg)  11/29/20 138 lb 14.2 oz (63 kg)  11/26/20 139 lb (63  kg)     CBC    Component Value Date/Time   WBC 3.8 (L) 12/03/2020 0859   WBC 10.8 (H) 08/28/2020 0140   RBC 3.25 (L) 12/03/2020 0859   HGB 10.1 (L) 12/03/2020 0859   HCT 31.4 (L) 12/03/2020 0859   PLT 151 12/03/2020 0859   MCV 96.6 12/03/2020 0859   MCH 31.1 12/03/2020 0859   MCHC 32.2 12/03/2020 0859   RDW 12.6 12/03/2020 0859   LYMPHSABS 1.4 12/03/2020 0859    MONOABS 0.3 12/03/2020 0859   EOSABS 0.0 12/03/2020 0859   BASOSABS 0.0 12/03/2020 0859     Chest Imaging: 06/11/2020 coronary calcium CT scan images: Report reviewed in epic Care Everywhere A right upper lobe 1.8 cm lung nodule concerning for primary bronchogenic carcinoma. The patient's images have been independently reviewed by me.    Pulmonary Functions Testing Results: PFT Results Latest Ref Rng & Units 06/24/2020  FVC-Pre L 1.59  FVC-Predicted Pre % 69  FVC-Post L 1.89  FVC-Predicted Post % 82  Pre FEV1/FVC % % 67  Post FEV1/FCV % % 68  FEV1-Pre L 1.06  FEV1-Predicted Pre % 59  FEV1-Post L 1.28  DLCO uncorrected ml/min/mmHg 10.29  DLCO UNC% % 53  DLVA Predicted % 85  TLC L 4.78  TLC % Predicted % 94  RV % Predicted % 142    FeNO:   Pathology:   08/26/2020 RUL Lobectomy:  . LUNG, RIGHT UPPER LOBE, LOBECTOMY:  - Multifocal invasive adenocarcinoma, moderately differentiated, 1.4 cm  - Carcinoma involves the pleura  - Margins uninvolved by adenocarcinoma  - See oncology table and comment below   10/10/2020:  Specimen Submitted: A. PLEURAL FLUID, RIGHT, THORACENTESIS:  FINAL MICROSCOPIC DIAGNOSIS:  - Atypical cells present  SPECIMEN ADEQUACY:  Satisfactory for evaluation  DIAGNOSTIC COMMENTS:  IHC for TTF-1 identifies rare cells morphologically indistinct from cell  block material. D2-40 is positive in mesothelial cells. CD163 is  positive in histiocytes. MOC31 is negative. Should fluid  re-accumulate, cytology may be helpful  Echocardiogram:   Heart Catheterization:     Assessment & Plan:     ICD-10-CM   1. Recurrent pleural effusion on right  J90 Ambulatory referral to Pulmonology    Procedural/ Surgical Case Request: INSERTION PLEURAL DRAINAGE CATHETER  2. Adenocarcinoma of lung, stage IIb, right (HCC)  C34.91   3. Adenocarcinoma of left lung, stage 1 (HCC)  C34.92   4. Former smoker  Z87.891   5. Centrilobular emphysema ([place])  J43.2    6. S/P lobectomy of lung  Z90.2     Discussion:  This is a 75 year old female, stage IIb, N0, M0 adenocarcinoma of the right upper lobe, stage I left lower lobe lung cancer status post right upper lobectomy, plans for chemotherapy adjuvant followed by radiation to the left.  Now has recurrent right-sided pleural effusion.  Has been tapped several times.  Cytology was negative for recurrence of malignancy.  Former smoker 60+ pack year history, currently COPD managed with Stiolto.  Plan: We will plan for indwelling pleural catheter placement. We discussed the risk benefits and alternatives of procedure today in the office. She has had multiple taps. I think try to keep the pleural space dry and drained daily will have a greater chance at auto pleurodesis. Continue Stiolto Continue albuterol as needed Tentative plan will be for Pleurx catheter placement on 12/18/2020.    Current Outpatient Medications:  .  acetaminophen (TYLENOL) 500 MG tablet, Take 2 tablets (1,000 mg total) by mouth every  6 (six) hours as needed. (Patient taking differently: Take 1,000 mg by mouth every 6 (six) hours as needed for mild pain or moderate pain.), Disp: 30 tablet, Rfl: 0 .  albuterol (VENTOLIN HFA) 108 (90 Base) MCG/ACT inhaler, Inhale 1-2 puffs into the lungs every 4 (four) hours as needed for wheezing or shortness of breath., Disp: , Rfl:  .  alendronate (FOSAMAX) 70 MG tablet, Take 70 mg by mouth every Saturday., Disp: , Rfl:  .  ALPRAZolam (XANAX) 1 MG tablet, Take 1 mg by mouth 3 (three) times daily as needed for sleep., Disp: , Rfl:  .  Calcium Carbonate-Vitamin D (CALCIUM + D PO), Take 1 tablet by mouth 2 (two) times daily., Disp: , Rfl:  .  cyclobenzaprine (FLEXERIL) 5 MG tablet, Take 5 mg by mouth 3 (three) times daily as needed for muscle spasms (sciatic nerve). , Disp: , Rfl:  .  dexamethasone (DECADRON) 2 MG tablet, Take 4 mg by mouth See admin instructions. Take 4 mg mouth twice daily the day  before, day of and 4 days after  the chemotherapy every 3 weeks., Disp: , Rfl:  .  folic acid (FOLVITE) 1 MG tablet, Take 1 tablet (1 mg total) by mouth daily. (Patient taking differently: Take 1 mg by mouth daily at 12 noon.), Disp: 30 tablet, Rfl: 4 .  guaiFENesin (MUCINEX) 600 MG 12 hr tablet, Take 2 tablets (1,200 mg total) by mouth 2 (two) times daily as needed. (Patient taking differently: Take 1,200 mg by mouth 2 (two) times daily as needed for to loosen phlegm.), Disp: , Rfl:  .  ibuprofen (ADVIL) 800 MG tablet, Take 1 tablet (800 mg total) by mouth every 8 (eight) hours as needed for moderate pain., Disp: 30 tablet, Rfl: 0 .  Lidocaine 4 % PTCH, Place 1 patch onto the skin daily as needed (sciatic nerve pain)., Disp: , Rfl:  .  magnesium oxide (MAG-OX) 400 (241.3 Mg) MG tablet, Take 1 tablet (400 mg total) by mouth 2 (two) times daily., Disp: 60 tablet, Rfl: 1 .  naproxen (NAPROSYN) 375 MG tablet, Take 1 tablet (375 mg total) by mouth 2 (two) times daily as needed for moderate pain., Disp: 8 tablet, Rfl: 0 .  omeprazole (PRILOSEC) 40 MG capsule, Take 40 mg by mouth 2 (two) times daily., Disp: , Rfl:  .  oxyCODONE-acetaminophen (PERCOCET) 10-325 MG tablet, Take 1 tablet by mouth every 8 (eight) hours as needed (sciatic nerve pain)., Disp: , Rfl:  .  pregabalin (LYRICA) 25 MG capsule, Take 1 capsule (25 mg total) by mouth 2 (two) times daily., Disp: 60 capsule, Rfl: 0 .  prochlorperazine (COMPAZINE) 10 MG tablet, TAKE 1 TABLET(10 MG) BY MOUTH EVERY 6 HOURS AS NEEDED FOR NAUSEA OR VOMITING (Patient taking differently: Take 10 mg by mouth every 6 (six) hours as needed for nausea or vomiting.), Disp: 30 tablet, Rfl: 0 .  rosuvastatin (CRESTOR) 10 MG tablet, Take 10 mg by mouth at bedtime., Disp: , Rfl:  .  Tiotropium Bromide-Olodaterol (STIOLTO RESPIMAT) 2.5-2.5 MCG/ACT AERS, Inhale 2 puffs into the lungs daily. (Patient not taking: Reported on 12/09/2020), Disp: 4 g, Rfl: 0  I spent 42 minutes  dedicated to the care of this patient on the date of this encounter to include pre-visit review of records, face-to-face time with the patient discussing conditions above, post visit ordering of testing, clinical documentation with the electronic health record, making appropriate referrals as documented, and communicating necessary findings to members of the patients care  team.    [name], DO [facility] 12/09/2020 10:19 AM

## 2020-12-10 ENCOUNTER — Telehealth: Payer: Self-pay | Admitting: Pulmonary Disease

## 2020-12-10 ENCOUNTER — Inpatient Hospital Stay: Payer: Medicare HMO

## 2020-12-10 ENCOUNTER — Inpatient Hospital Stay: Payer: Medicare HMO | Attending: Internal Medicine

## 2020-12-10 ENCOUNTER — Inpatient Hospital Stay: Payer: Medicare HMO | Admitting: Physician Assistant

## 2020-12-10 ENCOUNTER — Encounter: Payer: Self-pay | Admitting: Physician Assistant

## 2020-12-10 ENCOUNTER — Other Ambulatory Visit: Payer: Self-pay

## 2020-12-10 VITALS — BP 144/68 | HR 61 | Temp 97.8°F | Resp 15 | Ht 64.0 in | Wt 144.0 lb

## 2020-12-10 DIAGNOSIS — R001 Bradycardia, unspecified: Secondary | ICD-10-CM | POA: Diagnosis not present

## 2020-12-10 DIAGNOSIS — C3491 Malignant neoplasm of unspecified part of right bronchus or lung: Secondary | ICD-10-CM | POA: Diagnosis not present

## 2020-12-10 DIAGNOSIS — R110 Nausea: Secondary | ICD-10-CM

## 2020-12-10 DIAGNOSIS — Z5111 Encounter for antineoplastic chemotherapy: Secondary | ICD-10-CM | POA: Diagnosis not present

## 2020-12-10 DIAGNOSIS — J90 Pleural effusion, not elsewhere classified: Secondary | ICD-10-CM | POA: Insufficient documentation

## 2020-12-10 DIAGNOSIS — C3411 Malignant neoplasm of upper lobe, right bronchus or lung: Secondary | ICD-10-CM | POA: Insufficient documentation

## 2020-12-10 LAB — CBC WITH DIFFERENTIAL (CANCER CENTER ONLY)
Abs Immature Granulocytes: 0 10*3/uL (ref 0.00–0.07)
Basophils Absolute: 0 10*3/uL (ref 0.0–0.1)
Basophils Relative: 0 %
Eosinophils Absolute: 0 10*3/uL (ref 0.0–0.5)
Eosinophils Relative: 0 %
HCT: 30.3 % — ABNORMAL LOW (ref 36.0–46.0)
Hemoglobin: 10 g/dL — ABNORMAL LOW (ref 12.0–15.0)
Immature Granulocytes: 0 %
Lymphocytes Relative: 40 %
Lymphs Abs: 0.8 10*3/uL (ref 0.7–4.0)
MCH: 31 pg (ref 26.0–34.0)
MCHC: 33 g/dL (ref 30.0–36.0)
MCV: 93.8 fL (ref 80.0–100.0)
Monocytes Absolute: 0.3 10*3/uL (ref 0.1–1.0)
Monocytes Relative: 14 %
Neutro Abs: 0.9 10*3/uL — ABNORMAL LOW (ref 1.7–7.7)
Neutrophils Relative %: 46 %
Platelet Count: 296 10*3/uL (ref 150–400)
RBC: 3.23 MIL/uL — ABNORMAL LOW (ref 3.87–5.11)
RDW: 12.5 % (ref 11.5–15.5)
WBC Count: 2 10*3/uL — ABNORMAL LOW (ref 4.0–10.5)
nRBC: 0 % (ref 0.0–0.2)

## 2020-12-10 LAB — CMP (CANCER CENTER ONLY)
ALT: 8 U/L (ref 0–44)
AST: 11 U/L — ABNORMAL LOW (ref 15–41)
Albumin: 3.5 g/dL (ref 3.5–5.0)
Alkaline Phosphatase: 76 U/L (ref 38–126)
Anion gap: 13 (ref 5–15)
BUN: 10 mg/dL (ref 8–23)
CO2: 21 mmol/L — ABNORMAL LOW (ref 22–32)
Calcium: 8.8 mg/dL — ABNORMAL LOW (ref 8.9–10.3)
Chloride: 109 mmol/L (ref 98–111)
Creatinine: 0.78 mg/dL (ref 0.44–1.00)
GFR, Estimated: >60 mL/min (ref >60)
Glucose, Bld: 142 mg/dL — ABNORMAL HIGH (ref 70–99)
Potassium: 3.8 mmol/L (ref 3.5–5.1)
Sodium: 143 mmol/L (ref 135–145)
Total Bilirubin: 0.3 mg/dL (ref 0.3–1.2)
Total Protein: 6.4 g/dL — ABNORMAL LOW (ref 6.5–8.1)

## 2020-12-10 LAB — MAGNESIUM: Magnesium: 1.4 mg/dL — ABNORMAL LOW (ref 1.7–2.4)

## 2020-12-10 MED ORDER — ONDANSETRON HCL 8 MG PO TABS: 8.0000 mg | ORAL_TABLET | Freq: Three times a day (TID) | ORAL | 1 refills | Status: DC | PRN

## 2020-12-10 NOTE — Telephone Encounter (Signed)
Document on previous telephone message per Lauren.

## 2020-12-10 NOTE — Telephone Encounter (Signed)
Called spoke with patient , to let her know her time and date for the pleurx cath - 12/18/20 at 2pm and covid testing 4/1///22 at [place].  Patient was at Dr. Arvilla Market office while on phone and her WBC count is low so they want to change the chemo date to next Tuesday 12/17/20. Nurse at office was going to contact Dr. Valeta Harms and discuss having chemo and then a procedure the very next day.    Will route to Dr. Valeta Harms as an Juluis Rainier

## 2020-12-10 NOTE — Telephone Encounter (Signed)
Spoke to patient to make patient aware of the covid test (4/11 @ 8:45 AM) & pleurex (4/13@ 2 PM) dates.  Pt states she was made aware by Lauren.  Pt states that Dr. Inda Merlin was ok with the pleurex on 4/13, & her chemo on 4/12.  Would like to make sure that someone reached out to Dr. Valeta Harms & to make sure that Dr. Valeta Harms is also okay with this.

## 2020-12-11 NOTE — Telephone Encounter (Signed)
Yes ok for pleurex  Tonya [name]

## 2020-12-12 ENCOUNTER — Ambulatory Visit: Payer: Medicare HMO | Admitting: Pulmonary Disease

## 2020-12-12 ENCOUNTER — Telehealth: Payer: Self-pay | Admitting: Pulmonary Disease

## 2020-12-12 ENCOUNTER — Telehealth: Payer: Self-pay | Admitting: Medical Oncology

## 2020-12-12 NOTE — Telephone Encounter (Signed)
Pt states that she is supposed to have a Pleurex for (12/18/2020). Pt stated that she was supposed to do Chemo  on Tuesday but her white blood cell count was low so they were unable to perform the chemo. Pt stated that she doesn't know when she will be rescheduled for the chemo bc Dr. Julien Nordmann has not set a date. Also, pt wanted to get clarification from Dr. Valeta Harms prior to procedure. Pls regard [phone]

## 2020-12-12 NOTE — Telephone Encounter (Signed)
Called and spoke with patient who states that she is supposed to have a Pleurx placed on 12/18/2020. Patient stated that she was supposed to do Chemo on Tuesday but her white blood cell count was low so they were unable to perform the chemo. Patient stated that she doesn't know when she will be rescheduled for the chemo because Dr. Julien Nordmann has not set a date. Patient just wanted to update Dr. Valeta Harms and just check with him in regards to see if he felt that it was ok to proceed with procedure on Wednesday 12/18/20.  Dr. Valeta Harms please advise

## 2020-12-12 NOTE — Telephone Encounter (Addendum)
She has not heard from scheduling about her chemo appts for next week.  Schedule message sent.

## 2020-12-13 NOTE — Telephone Encounter (Signed)
Yes we are good for the procedure.   [facility] 12/13/2020 3:45 PM

## 2020-12-16 ENCOUNTER — Telehealth: Payer: Self-pay | Admitting: Internal Medicine

## 2020-12-16 ENCOUNTER — Other Ambulatory Visit (HOSPITAL_COMMUNITY)
Admission: RE | Admit: 2020-12-16 | Discharge: 2020-12-16 | Disposition: A | Discharge status: Home / Self Care | Payer: Medicare HMO | Source: Ambulatory Visit | Attending: Pulmonary Disease | Admitting: Pulmonary Disease

## 2020-12-16 ENCOUNTER — Telehealth: Payer: Self-pay | Admitting: Pulmonary Disease

## 2020-12-16 DIAGNOSIS — Z01812 Encounter for preprocedural laboratory examination: Secondary | ICD-10-CM | POA: Diagnosis present

## 2020-12-16 DIAGNOSIS — Z20822 Contact with and (suspected) exposure to covid-19: Secondary | ICD-10-CM | POA: Diagnosis not present

## 2020-12-16 LAB — SARS CORONAVIRUS 2 (TAT 6-24 HRS): SARS Coronavirus 2: NEGATIVE

## 2020-12-16 NOTE — Telephone Encounter (Signed)
Thanks and please try to reschedule for tomorrow afternoon or Thursday afternoon.    [name], DO [facility] 12/16/2020 3:22 PM

## 2020-12-16 NOTE — Telephone Encounter (Signed)
Called and spoke with patient. She states on Wednesday her chemo is scheduled for 8 am which usually takes 6 hours to complete. She is also scheduled for her Pleurx at 1 pm. Patient states she does not think that she would be able to do both in the same day. She would like to know which one she should do? Patient states that she knows that both are very important.   BI please advise

## 2020-12-16 NOTE — Telephone Encounter (Signed)
Spoke with BI via [employer]. He stated that she still needs to have the procedure done as well as the infusion. He wants to see if she can have the procedure done either tomorrow 12/17/20 or Thursday 12/19/2020. I called the patient to discuss the possible dates but she did not answer. Left a message for her to call us back.   Will route message to the procedure pool for follow up.

## 2020-12-16 NOTE — Telephone Encounter (Signed)
Called and spoke with patient. She stated that she is scheduled to have the pleurx catheter placed on Wednesday at 115pm with [facility] at [facility]. She received a call today from Dr. Worthy Flank office stating that her infusion is now scheduled for 8am in [facility] and it is a 6hr infusion. She is concerned because she knows she needs both of them and does know which one she needs to reschedule. She has already been covid tested for her procedure on Wednesday.   Icard, can you please advise? Thanks.

## 2020-12-16 NOTE — Telephone Encounter (Signed)
Called spoke with patient via husbands cell phone.  She is good with Thursday afternoon at 2pm for the pleurx cath.  Nothing further needed

## 2020-12-16 NOTE — Telephone Encounter (Signed)
Scheduled per los. Called and spoke with patient. Confirmed appts and gave address to [facility]

## 2020-12-17 ENCOUNTER — Other Ambulatory Visit: Payer: Self-pay

## 2020-12-17 ENCOUNTER — Inpatient Hospital Stay (HOSPITAL_BASED_OUTPATIENT_CLINIC_OR_DEPARTMENT_OTHER): Payer: Medicare HMO | Admitting: Internal Medicine

## 2020-12-17 ENCOUNTER — Inpatient Hospital Stay: Payer: Medicare HMO

## 2020-12-17 ENCOUNTER — Encounter: Payer: Self-pay | Admitting: Internal Medicine

## 2020-12-17 VITALS — BP 134/70 | HR 62 | Temp 97.7°F | Resp 18 | Ht 64.0 in | Wt 143.9 lb

## 2020-12-17 DIAGNOSIS — C3491 Malignant neoplasm of unspecified part of right bronchus or lung: Secondary | ICD-10-CM

## 2020-12-17 DIAGNOSIS — Z5111 Encounter for antineoplastic chemotherapy: Secondary | ICD-10-CM

## 2020-12-17 LAB — CMP (CANCER CENTER ONLY)
ALT: 6 U/L (ref 0–44)
AST: 14 U/L — ABNORMAL LOW (ref 15–41)
Albumin: 3.4 g/dL — ABNORMAL LOW (ref 3.5–5.0)
Alkaline Phosphatase: 78 U/L (ref 38–126)
Anion gap: 9 (ref 5–15)
BUN: 9 mg/dL (ref 8–23)
CO2: 24 mmol/L (ref 22–32)
Calcium: 8.8 mg/dL — ABNORMAL LOW (ref 8.9–10.3)
Chloride: 109 mmol/L (ref 98–111)
Creatinine: 0.74 mg/dL (ref 0.44–1.00)
GFR, Estimated: >60 mL/min (ref >60)
Glucose, Bld: 85 mg/dL (ref 70–99)
Potassium: 4.2 mmol/L (ref 3.5–5.1)
Sodium: 142 mmol/L (ref 135–145)
Total Bilirubin: <0.2 mg/dL — ABNORMAL LOW (ref 0.3–1.2)
Total Protein: 6.4 g/dL — ABNORMAL LOW (ref 6.5–8.1)

## 2020-12-17 LAB — CBC WITH DIFFERENTIAL (CANCER CENTER ONLY)
Abs Immature Granulocytes: 0.04 10*3/uL (ref 0.00–0.07)
Basophils Absolute: 0 10*3/uL (ref 0.0–0.1)
Basophils Relative: 1 %
Eosinophils Absolute: 0.2 10*3/uL (ref 0.0–0.5)
Eosinophils Relative: 5 %
HCT: 32.7 % — ABNORMAL LOW (ref 36.0–46.0)
Hemoglobin: 10.5 g/dL — ABNORMAL LOW (ref 12.0–15.0)
Immature Granulocytes: 1 %
Lymphocytes Relative: 43 %
Lymphs Abs: 1.7 10*3/uL (ref 0.7–4.0)
MCH: 30.8 pg (ref 26.0–34.0)
MCHC: 32.1 g/dL (ref 30.0–36.0)
MCV: 95.9 fL (ref 80.0–100.0)
Monocytes Absolute: 0.7 10*3/uL (ref 0.1–1.0)
Monocytes Relative: 18 %
Neutro Abs: 1.2 10*3/uL — ABNORMAL LOW (ref 1.7–7.7)
Neutrophils Relative %: 32 %
Platelet Count: 240 10*3/uL (ref 150–400)
RBC: 3.41 MIL/uL — ABNORMAL LOW (ref 3.87–5.11)
RDW: 13 % (ref 11.5–15.5)
WBC Count: 3.8 10*3/uL — ABNORMAL LOW (ref 4.0–10.5)
nRBC: 0 % (ref 0.0–0.2)

## 2020-12-17 LAB — MAGNESIUM: Magnesium: 1.7 mg/dL (ref 1.7–2.4)

## 2020-12-17 MED ORDER — LORAZEPAM 0.5 MG PO TABS: 0.5000 mg | ORAL_TABLET | Freq: Every day | ORAL | 0 refills | Status: DC | PRN

## 2020-12-17 NOTE — Progress Notes (Signed)
[facility] [phone]   Fax:([phone]) [place], [place] [address]  DIAGNOSIS: Stage IIB (T3, N0, M0) non-small cell lung cancer, adenocarcinoma involving the right upper lobe. The patient also has evidence for adenocarcinoma of the left upper lobe lung nodule as well as subcentimeter pulmonary nodules in the right lung concerning for stage IV lung cancer with multifocal disease bilaterally.  Biomarker Findings  Microsatellite status - MS-Equivocal ? Tumor Mutational Burden - Cannot Be Determined Genomic Findings For a complete list of the genes assayed, please refer to the Appendix. MET D1024f*2 AXL T343M TP53 I254V 7 Disease relevant genes with no reportable alterations: ALK, BRAF, EGFR, ERBB2, KRAS, RET, ROS1  PDL1 Expression 20 %.  PRIOR THERAPY: status post right upper lobectomy with lymph node sampling on August 26, 2020 under the care of Dr. LKipp Brood  CURRENT THERAPY: Adjuvant systemic chemotherapy with cisplatin 75 mg/M2 and Alimta 550/M2 every 3 weeks. First dose October 22, 2020.  Status post 2 cycles.  INTERVAL HISTORY: Tonya [id number].[id number]. female returns to the clinic today for follow-up visit accompanied by her husband.  The patient is feeling fine except for the intermittent nausea after the treatment.  She extended her treatment with Decadron for few days after the last cycle of her chemotherapy and this did help some.  She continues to have fluid on the right side of the chest and she is scheduled to have a Pleurx catheter placed by Dr. IValeta Harmslater this week.  She denied having any shortness of breath, cough or hemoptysis.  She has no fever or chills.  She has no diarrhea or constipation.  She is here today for evaluation before starting cycle #3 of her adjuvant chemotherapy tomorrow.  MEDICAL HISTORY: Past Medical History:  Diagnosis Date  . Adenoma of left  adrenal gland   . [place]    Lung  . Colon polyps    hyperplastic  . Complication of anesthesia    Very emotional and cries after anesthesia  . GERD (gastroesophageal reflux disease)   . Kidney stone   . Liver lesion   . Microhematuria     ALLERGIES:  is allergic to other and penicillins.  MEDICATIONS:  Current Outpatient Medications  Medication Sig Dispense Refill  . acetaminophen (TYLENOL) 500 MG tablet Take 2 tablets (1,000 mg total) by mouth every 6 (six) hours as needed. (Patient taking differently: Take 1,000 mg by mouth every 6 (six) hours as needed for mild pain or moderate pain.) 30 tablet 0  . albuterol (VENTOLIN HFA) 108 (90 Base) MCG/ACT inhaler Inhale 1-2 puffs into the lungs every 4 (four) hours as needed for wheezing or shortness of breath.    .Marland Kitchenalendronate (FOSAMAX) 70 MG tablet Take 70 mg by mouth every Saturday.    . ALPRAZolam (XANAX) 1 MG tablet Take 1 mg by mouth 3 (three) times daily as needed for sleep.    . Calcium Carbonate-Vitamin D (CALCIUM + D PO) Take 1 tablet by mouth 2 (two) times daily.    . cyclobenzaprine (FLEXERIL) 5 MG tablet Take 5 mg by mouth 3 (three) times daily as needed for muscle spasms (sciatic nerve).     .Marland Kitchendexamethasone (DECADRON) 2 MG tablet Take 4 mg by mouth See admin instructions. Take 4 mg mouth twice daily the day before, day of and 4 days after  the chemotherapy every 3 weeks.    .Marland Kitchen  folic acid (FOLVITE) 1 MG tablet Take 1 tablet (1 mg total) by mouth daily. (Patient taking differently: Take 1 mg by mouth daily at 12 noon.) 30 tablet 4  . guaiFENesin (MUCINEX) 600 MG 12 hr tablet Take 2 tablets (1,200 mg total) by mouth 2 (two) times daily as needed. (Patient taking differently: Take 1,200 mg by mouth 2 (two) times daily as needed for to loosen phlegm.)    . ibuprofen (ADVIL) 800 MG tablet Take 1 tablet (800 mg total) by mouth every 8 (eight) hours as needed for moderate pain. 30 tablet 0  . Lidocaine 4 % PTCH Place 1 patch onto the  skin daily as needed (sciatic nerve pain).    . magnesium oxide (MAG-OX) 400 (241.3 Mg) MG tablet Take 1 tablet (400 mg total) by mouth 2 (two) times daily. 60 tablet 1  . naproxen (NAPROSYN) 375 MG tablet Take 1 tablet (375 mg total) by mouth 2 (two) times daily as needed for moderate pain. 8 tablet 0  . omeprazole (PRILOSEC) 40 MG capsule Take 40 mg by mouth 2 (two) times daily.    . ondansetron (ZOFRAN) 8 MG tablet Take 1 tablet (8 mg total) by mouth every 8 (eight) hours as needed for nausea or vomiting. Starting at least 3 days after chemotherapy 30 tablet 1  . oxyCODONE-acetaminophen (PERCOCET) 10-325 MG tablet Take 1 tablet by mouth every 8 (eight) hours as needed (sciatic nerve pain).    . pregabalin (LYRICA) 25 MG capsule Take 1 capsule (25 mg total) by mouth 2 (two) times daily. 60 capsule 0  . prochlorperazine (COMPAZINE) 10 MG tablet TAKE 1 TABLET(10 MG) BY MOUTH EVERY 6 HOURS AS NEEDED FOR NAUSEA OR VOMITING (Patient taking differently: Take 10 mg by mouth every 6 (six) hours as needed for nausea or vomiting.) 30 tablet 0  . rosuvastatin (CRESTOR) 10 MG tablet Take 10 mg by mouth at bedtime.     No current facility-administered medications for this visit.    SURGICAL HISTORY:  Past Surgical History:  Procedure Laterality Date  . ABDOMINAL HYSTERECTOMY    . APPENDECTOMY    . BRONCHIAL BIOPSY  07/18/2020   Procedure: BRONCHIAL BIOPSIES;  Surgeon: [name], DO;  Location: [facility];  Service: Pulmonary;;  . BRONCHIAL BRUSHINGS  07/18/2020   Procedure: BRONCHIAL BRUSHINGS;  Surgeon: [name], DO;  Location: [facility];  Service: Pulmonary;;  . BRONCHIAL NEEDLE ASPIRATION BIOPSY  07/18/2020   Procedure: BRONCHIAL NEEDLE ASPIRATION BIOPSIES;  Surgeon: [name], DO;  Location: [facility];  Service: Pulmonary;;  . BRONCHIAL WASHINGS  07/18/2020   Procedure: BRONCHIAL WASHINGS;  Surgeon: [name], DO;  Location: [facility];  Service: Pulmonary;;   . FIDUCIAL MARKER PLACEMENT  07/18/2020   Procedure: FIDUCIAL MARKER PLACEMENT;  Surgeon: [name], DO;  Location: [facility];  Service: Pulmonary;;  . INTERCOSTAL NERVE BLOCK Right 08/26/2020   Procedure: INTERCOSTAL NERVE BLOCK;  Surgeon: [name], MD;  Location: [facility];  Service: Thoracic;  Laterality: Right;  . IR THORACENTESIS ASP PLEURAL SPACE W/IMG GUIDE  10/10/2020  . NODE DISSECTION Right 08/26/2020   Procedure: NODE DISSECTION;  Surgeon: [name], MD;  Location: [facility];  Service: Thoracic;  Laterality: Right;  . THORACENTESIS Right 11/04/2020   Procedure: THORACENTESIS;  Surgeon: [name], DO;  Location: [facility] ENDOSCOPY;  Service: Pulmonary;  Laterality: Right;  . THORACENTESIS Right 11/29/2020   Procedure: THORACENTESIS;  Surgeon: [name], DO;  Location: [facility]  ENDOSCOPY;  Service: Pulmonary;  Laterality: Right;  Marland Kitchen VIDEO BRONCHOSCOPY WITH ENDOBRONCHIAL NAVIGATION N/A 07/18/2020   Procedure: VIDEO BRONCHOSCOPY WITH ENDOBRONCHIAL NAVIGATION;  Surgeon: [name], DO;  Location: [facility];  Service: Pulmonary;  Laterality: N/A;  . VIDEO BRONCHOSCOPY WITH ENDOBRONCHIAL ULTRASOUND  07/18/2020   Procedure: VIDEO BRONCHOSCOPY WITH ENDOBRONCHIAL ULTRASOUND;  Surgeon: [name], DO;  Location: [facility];  Service: Pulmonary;;    REVIEW OF SYSTEMS:  A comprehensive review of systems was negative except for: Constitutional: positive for fatigue Respiratory: positive for pleurisy/chest pain Gastrointestinal: positive for nausea   PHYSICAL EXAMINATION: General appearance: alert, cooperative, fatigued and no distress Head: Normocephalic, without obvious abnormality, atraumatic Neck: no adenopathy, no JVD, supple, symmetrical, trachea midline and thyroid not enlarged, symmetric, no tenderness/mass/nodules Lymph nodes: Cervical, supraclavicular, and axillary nodes normal. Resp: clear to auscultation bilaterally Back: symmetric, no  curvature. ROM normal. No CVA tenderness. Cardio: regular rate and rhythm, S1, S2 normal, no murmur, click, rub or gallop GI: soft, non-tender; bowel sounds normal; no masses,  no organomegaly Extremities: extremities normal, atraumatic, no cyanosis or edema  ECOG PERFORMANCE STATUS: 1 - Symptomatic but completely ambulatory  Blood pressure 134/70, pulse 62, temperature 97.7 F (36.5 C), temperature source Tympanic, resp. rate 18, height _0  (1.626 m), weight 143 lb 14.4 oz (65.3 kg), SpO2 100 %.  LABORATORY DATA: Lab Results  Component Value Date   WBC 3.8 (L) 12/17/2020   HGB 10.5 (L) 12/17/2020   HCT 32.7 (L) 12/17/2020   MCV 95.9 12/17/2020   PLT 240 12/17/2020      Chemistry      Component Value Date/Time   NA 143 12/10/2020 0839   K 3.8 12/10/2020 0839   CL 109 12/10/2020 0839   CO2 21 (L) 12/10/2020 0839   BUN 10 12/10/2020 0839   CREATININE 0.78 12/10/2020 0839      Component Value Date/Time   CALCIUM 8.8 (L) 12/10/2020 0839   ALKPHOS 76 12/10/2020 0839   AST 11 (L) 12/10/2020 0839   ALT 8 12/10/2020 0839   BILITOT 0.3 12/10/2020 0839       RADIOGRAPHIC STUDIES: DG Chest 2 View  Result Date: 11/26/2020 CLINICAL DATA:  History of pleural effusion. Adenocarcinoma of the right lung EXAM: CHEST - 2 VIEW COMPARISON:  11/01/2020, 11/04/2020 FINDINGS: Reaccumulation of right-sided pleural effusion, now moderate-sized. Persistent right perihilar opacity. Mild streaky left basilar opacity. Heart size within normal limits. No left-sided pleural effusion. No pneumothorax. IMPRESSION: 1. Reaccumulation of right-sided pleural effusion, now moderate-sized. 2. Persistent right perihilar opacity. 3. Probable mild left basilar atelectasis. Electronically Signed   By: Davina Poke D.O.   On: 11/26/2020 11:49   DG CHEST PORT 1 VIEW  Result Date: 11/29/2020 CLINICAL DATA:  Right pleural effusion status post thoracentesis EXAM: PORTABLE CHEST 1 VIEW COMPARISON:  11/26/2020  FINDINGS: Single frontal view of the chest demonstrates stable enlargement of the cardiac silhouette. Decreased right pleural effusion after reported right thoracentesis, with trace residual fluid at the right costophrenic angle. No evidence of pneumothorax. Postsurgical changes are seen within the right lung from partial pneumonectomy. Continued nodularity at the left apex with fiduciary markers again noted. Chronic interstitial scarring noted throughout the lungs bilaterally. IMPRESSION: 1. No complication after right thoracentesis. Trace residual pleural effusion. 2. Stable left upper lobe nodularity with fiduciary markers unchanged. Electronically Signed   By: Randa Ngo M.D.   On: 11/29/2020 15:36    ASSESSMENT AND PLAN: This is a very pleasant 74 years  old African-American female recently diagnosed with a stage IIb (T3, N0, M0) non-small cell lung cancer, adenocarcinoma involving the right upper lobe with 2 nodules. The patient also has evidence for adenocarcinoma of the left upper lobe and subcentimeter nodules in the right lung concerning for stage IV with multifocal disease bilaterally. She is status post right upper lobectomy with lymph node dissection on August 26, 2020 under the care of Dr. Kipp Brood. Her molecular studies by foundation 1 showed no actionable mutations. She has PD-L1 expression of 20%. She is currently undergoing adjuvant treatment with systemic chemotherapy with cisplatin 75 mg/M2 and Alimta 500 mg/M2 every 3 weeks status post 2 cycles.  The patient tolerated the second cycle of her treatment well except for the intermittent nausea. I recommended for her to proceed with cycle #3 of her treatment tomorrow as planned. For the nausea I will add Ativan 0.5 mg p.o. daily as needed for the nausea. I will see her back for follow-up visit in 3 weeks for evaluation before the last cycle of her chemotherapy. The patient was advised to call immediately if she has any concerning  symptoms in the interval. The patient voices understanding of current disease status and treatment options and is in agreement with the current care plan.  All questions were answered. The patient knows to call the clinic with any problems, questions or concerns. We can certainly see the patient much sooner if necessary.  Disclaimer: This note was dictated with voice recognition software. Similar sounding words can inadvertently be transcribed and may not be corrected upon review.

## 2020-12-18 ENCOUNTER — Inpatient Hospital Stay: Payer: Medicare HMO

## 2020-12-18 VITALS — BP 129/78 | HR 61 | Temp 97.9°F | Resp 17

## 2020-12-18 DIAGNOSIS — J90 Pleural effusion, not elsewhere classified: Secondary | ICD-10-CM | POA: Insufficient documentation

## 2020-12-18 DIAGNOSIS — Z5111 Encounter for antineoplastic chemotherapy: Secondary | ICD-10-CM | POA: Diagnosis not present

## 2020-12-18 DIAGNOSIS — C3491 Malignant neoplasm of unspecified part of right bronchus or lung: Secondary | ICD-10-CM

## 2020-12-18 MED ORDER — CYANOCOBALAMIN 1000 MCG/ML IJ SOLN
INTRAMUSCULAR | Status: AC
  Filled 2020-12-18: qty 1

## 2020-12-18 MED ORDER — SODIUM CHLORIDE 0.9 % IV SOLN
510.0000 mg/m2 | Freq: Once | INTRAVENOUS | Status: AC
  Administered 2020-12-18: 900 mg via INTRAVENOUS
  Filled 2020-12-18: qty 16

## 2020-12-18 MED ORDER — PALONOSETRON HCL INJECTION 0.25 MG/5ML
0.2500 mg | Freq: Once | INTRAVENOUS | Status: AC
  Administered 2020-12-18: 0.25 mg via INTRAVENOUS

## 2020-12-18 MED ORDER — MAGNESIUM SULFATE 2 GM/50ML IV SOLN
2.0000 g | Freq: Once | INTRAVENOUS | Status: AC
  Administered 2020-12-18: 2 g via INTRAVENOUS
  Filled 2020-12-18: qty 50

## 2020-12-18 MED ORDER — POTASSIUM CHLORIDE IN NACL 20-0.9 MEQ/L-% IV SOLN
Freq: Once | INTRAVENOUS | Status: AC
  Filled 2020-12-18: qty 1000

## 2020-12-18 MED ORDER — SODIUM CHLORIDE 0.9 % IV SOLN
75.0000 mg/m2 | Freq: Once | INTRAVENOUS | Status: AC
  Administered 2020-12-18: 127 mg via INTRAVENOUS
  Filled 2020-12-18: qty 127

## 2020-12-18 MED ORDER — CYANOCOBALAMIN 1000 MCG/ML IJ SOLN
1000.0000 ug | Freq: Once | INTRAMUSCULAR | Status: AC
  Administered 2020-12-18: 1000 ug via INTRAMUSCULAR

## 2020-12-18 MED ORDER — SODIUM CHLORIDE 0.9 % IV SOLN
150.0000 mg | Freq: Once | INTRAVENOUS | Status: AC
  Administered 2020-12-18: 150 mg via INTRAVENOUS
  Filled 2020-12-18: qty 5

## 2020-12-18 MED ORDER — SODIUM CHLORIDE 0.9 % IV SOLN
10.0000 mg | Freq: Once | INTRAVENOUS | Status: AC
  Administered 2020-12-18: 10 mg via INTRAVENOUS
  Filled 2020-12-18: qty 10

## 2020-12-18 MED ORDER — SODIUM CHLORIDE 0.9 % IV SOLN
Freq: Once | INTRAVENOUS | Status: AC
  Filled 2020-12-18: qty 250

## 2020-12-18 MED ORDER — PALONOSETRON HCL INJECTION 0.25 MG/5ML
INTRAVENOUS | Status: AC
  Filled 2020-12-18: qty 5

## 2020-12-18 NOTE — Patient Instructions (Signed)
[facility] Discharge Instructions for Patients Receiving Chemotherapy  Today you received the following chemotherapy agents Cisplatin; Alimta  To help prevent nausea and vomiting after your treatment, we encourage you to take your nausea medication as directed by MD. **DO NOT TAKE ZOFRAN FOR 3 DAYS AFTER CHEMOTHERAPY**   If you develop nausea and vomiting that is not controlled by your nausea medication, call the clinic.   BELOW ARE SYMPTOMS THAT SHOULD BE REPORTED IMMEDIATELY:  *FEVER GREATER THAN 100.5 F  *CHILLS WITH OR WITHOUT FEVER  NAUSEA AND VOMITING THAT IS NOT CONTROLLED WITH YOUR NAUSEA MEDICATION  *UNUSUAL SHORTNESS OF BREATH  *UNUSUAL BRUISING OR BLEEDING  TENDERNESS IN MOUTH AND THROAT WITH OR WITHOUT PRESENCE OF ULCERS  *URINARY PROBLEMS  *BOWEL PROBLEMS  UNUSUAL RASH Items with * indicate a potential emergency and should be followed up as soon as possible.  Feel free to call the clinic should you have any questions or concerns. The clinic phone number is [phone].  Please show the [facility] at check-in to the Emergency Department and triage nurse.

## 2020-12-18 NOTE — Telephone Encounter (Signed)
See other encounter. Patient was able to get her infusion this morning at 8am. She will have her procedure with BI tomorrow at 2pm. Patient is aware.   Will close encounter.

## 2020-12-18 NOTE — Progress Notes (Signed)
Reviewed pt labs with Dr. Julien Nordmann and pt ok to treat with ANC 1.2

## 2020-12-19 ENCOUNTER — Ambulatory Visit (HOSPITAL_COMMUNITY)
Admission: RE | Admit: 2020-12-19 | Discharge: 2020-12-19 | Disposition: A | Discharge status: Home / Self Care | Payer: Medicare HMO | Attending: Pulmonary Disease | Admitting: Pulmonary Disease

## 2020-12-19 ENCOUNTER — Encounter (HOSPITAL_COMMUNITY)
Admission: RE | Disposition: A | Discharge status: Home / Self Care | Payer: Self-pay | Source: Home / Self Care | Attending: Pulmonary Disease

## 2020-12-19 ENCOUNTER — Encounter (HOSPITAL_COMMUNITY): Payer: Self-pay | Admitting: Pulmonary Disease

## 2020-12-19 DIAGNOSIS — Z79899 Other long term (current) drug therapy: Secondary | ICD-10-CM | POA: Diagnosis not present

## 2020-12-19 DIAGNOSIS — Z902 Acquired absence of lung [part of]: Secondary | ICD-10-CM | POA: Diagnosis not present

## 2020-12-19 DIAGNOSIS — Z7952 Long term (current) use of systemic steroids: Secondary | ICD-10-CM | POA: Diagnosis not present

## 2020-12-19 DIAGNOSIS — J432 Centrilobular emphysema: Secondary | ICD-10-CM | POA: Diagnosis not present

## 2020-12-19 DIAGNOSIS — Z538 Procedure and treatment not carried out for other reasons: Secondary | ICD-10-CM | POA: Diagnosis not present

## 2020-12-19 DIAGNOSIS — Z87891 Personal history of nicotine dependence: Secondary | ICD-10-CM | POA: Diagnosis not present

## 2020-12-19 DIAGNOSIS — Z7983 Long term (current) use of bisphosphonates: Secondary | ICD-10-CM | POA: Diagnosis not present

## 2020-12-19 DIAGNOSIS — C3492 Malignant neoplasm of unspecified part of left bronchus or lung: Secondary | ICD-10-CM | POA: Insufficient documentation

## 2020-12-19 DIAGNOSIS — C3491 Malignant neoplasm of unspecified part of right bronchus or lung: Secondary | ICD-10-CM | POA: Insufficient documentation

## 2020-12-19 DIAGNOSIS — J90 Pleural effusion, not elsewhere classified: Secondary | ICD-10-CM

## 2020-12-19 DIAGNOSIS — J910 Malignant pleural effusion: Secondary | ICD-10-CM | POA: Insufficient documentation

## 2020-12-19 SURGERY — CANCELLED PROCEDURE
Anesthesia: Topical | Laterality: Right

## 2020-12-19 NOTE — Op Note (Signed)
PCCM:  Patient presented today for evaluation of indwelling pleural catheter placement.  Ultrasound of the chest revealed only a small remaining amount of effusion.  Last week in the office she had more fluid than present today looks like it is slowly decreasing in size.  We will cancel procedure today.     [name], DO [facility] 12/19/2020 5:13 PM

## 2020-12-19 NOTE — Interval H&P Note (Signed)
History and Physical Interval Note:  12/19/2020 3:31 PM  Tonya [name]  has presented today for surgery, with the diagnosis of right pleural effusion.  The various methods of treatment have been discussed with the patient and family. After consideration of risks, benefits and other options for treatment, the patient has consented to  Procedure(s): INSERTION PLEURAL DRAINAGE CATHETER (Right) as a surgical intervention.  The patient's history has been reviewed, patient examined, no change in status, stable for surgery.  I have reviewed the patient's chart and labs.  Questions were answered to the patient's satisfaction.     [place]

## 2020-12-19 NOTE — Progress Notes (Signed)
Pleurx drainage catheter insertion procedure aborted d/t minimal pleural fluid. Patient not in any discomfort.

## 2020-12-24 ENCOUNTER — Other Ambulatory Visit: Payer: Self-pay

## 2020-12-24 ENCOUNTER — Inpatient Hospital Stay: Payer: Medicare HMO

## 2020-12-24 ENCOUNTER — Emergency Department (HOSPITAL_COMMUNITY)
Admission: EM | Admit: 2020-12-24 | Discharge: 2020-12-24 | Disposition: A | Discharge status: Home / Self Care | Payer: Medicare HMO | Attending: Emergency Medicine | Admitting: Emergency Medicine

## 2020-12-24 ENCOUNTER — Emergency Department (HOSPITAL_BASED_OUTPATIENT_CLINIC_OR_DEPARTMENT_OTHER): Payer: Medicare HMO

## 2020-12-24 ENCOUNTER — Encounter (HOSPITAL_COMMUNITY): Payer: Self-pay

## 2020-12-24 ENCOUNTER — Emergency Department (HOSPITAL_COMMUNITY): Payer: Medicare HMO

## 2020-12-24 ENCOUNTER — Inpatient Hospital Stay (HOSPITAL_BASED_OUTPATIENT_CLINIC_OR_DEPARTMENT_OTHER): Payer: Medicare HMO | Admitting: Medical

## 2020-12-24 VITALS — BP 160/73 | HR 69 | Temp 97.9°F | Resp 15 | Ht 64.0 in | Wt 139.4 lb

## 2020-12-24 DIAGNOSIS — Z85118 Personal history of other malignant neoplasm of bronchus and lung: Secondary | ICD-10-CM | POA: Diagnosis not present

## 2020-12-24 DIAGNOSIS — M7989 Other specified soft tissue disorders: Secondary | ICD-10-CM | POA: Diagnosis not present

## 2020-12-24 DIAGNOSIS — R001 Bradycardia, unspecified: Secondary | ICD-10-CM

## 2020-12-24 DIAGNOSIS — I82612 Acute embolism and thrombosis of superficial veins of left upper extremity: Secondary | ICD-10-CM | POA: Insufficient documentation

## 2020-12-24 DIAGNOSIS — C3491 Malignant neoplasm of unspecified part of right bronchus or lung: Secondary | ICD-10-CM | POA: Diagnosis not present

## 2020-12-24 DIAGNOSIS — Z87891 Personal history of nicotine dependence: Secondary | ICD-10-CM | POA: Diagnosis not present

## 2020-12-24 DIAGNOSIS — I808 Phlebitis and thrombophlebitis of other sites: Secondary | ICD-10-CM

## 2020-12-24 LAB — CBC WITH DIFFERENTIAL (CANCER CENTER ONLY)
Abs Immature Granulocytes: 0.02 10*3/uL (ref 0.00–0.07)
Basophils Absolute: 0 10*3/uL (ref 0.0–0.1)
Basophils Relative: 0 %
Eosinophils Absolute: 0 10*3/uL (ref 0.0–0.5)
Eosinophils Relative: 1 %
HCT: 34.2 % — ABNORMAL LOW (ref 36.0–46.0)
Hemoglobin: 11.3 g/dL — ABNORMAL LOW (ref 12.0–15.0)
Immature Granulocytes: 0 %
Lymphocytes Relative: 22 %
Lymphs Abs: 1.1 10*3/uL (ref 0.7–4.0)
MCH: 31.1 pg (ref 26.0–34.0)
MCHC: 33 g/dL (ref 30.0–36.0)
MCV: 94.2 fL (ref 80.0–100.0)
Monocytes Absolute: 0.2 10*3/uL (ref 0.1–1.0)
Monocytes Relative: 4 %
Neutro Abs: 3.7 10*3/uL (ref 1.7–7.7)
Neutrophils Relative %: 73 %
Platelet Count: 141 10*3/uL — ABNORMAL LOW (ref 150–400)
RBC: 3.63 MIL/uL — ABNORMAL LOW (ref 3.87–5.11)
RDW: 12.7 % (ref 11.5–15.5)
WBC Count: 5.1 10*3/uL (ref 4.0–10.5)
nRBC: 0 % (ref 0.0–0.2)

## 2020-12-24 LAB — CMP (CANCER CENTER ONLY)
ALT: 7 U/L (ref 0–44)
AST: 12 U/L — ABNORMAL LOW (ref 15–41)
Albumin: 3.6 g/dL (ref 3.5–5.0)
Alkaline Phosphatase: 63 U/L (ref 38–126)
Anion gap: 9 (ref 5–15)
BUN: 19 mg/dL (ref 8–23)
CO2: 27 mmol/L (ref 22–32)
Calcium: 8.8 mg/dL — ABNORMAL LOW (ref 8.9–10.3)
Chloride: 107 mmol/L (ref 98–111)
Creatinine: 0.86 mg/dL (ref 0.44–1.00)
GFR, Estimated: >60 mL/min (ref >60)
Glucose, Bld: 93 mg/dL (ref 70–99)
Potassium: 3.6 mmol/L (ref 3.5–5.1)
Sodium: 143 mmol/L (ref 135–145)
Total Bilirubin: 0.3 mg/dL (ref 0.3–1.2)
Total Protein: 6.5 g/dL (ref 6.5–8.1)

## 2020-12-24 LAB — CBC WITH DIFFERENTIAL/PLATELET
Abs Immature Granulocytes: 0.02 10*3/uL (ref 0.00–0.07)
Basophils Absolute: 0 10*3/uL (ref 0.0–0.1)
Basophils Relative: 0 %
Eosinophils Absolute: 0 10*3/uL (ref 0.0–0.5)
Eosinophils Relative: 0 %
HCT: 34.9 % — ABNORMAL LOW (ref 36.0–46.0)
Hemoglobin: 11.3 g/dL — ABNORMAL LOW (ref 12.0–15.0)
Immature Granulocytes: 0 %
Lymphocytes Relative: 11 %
Lymphs Abs: 0.5 10*3/uL — ABNORMAL LOW (ref 0.7–4.0)
MCH: 31.1 pg (ref 26.0–34.0)
MCHC: 32.4 g/dL (ref 30.0–36.0)
MCV: 96.1 fL (ref 80.0–100.0)
Monocytes Absolute: 0.1 10*3/uL (ref 0.1–1.0)
Monocytes Relative: 2 %
Neutro Abs: 4.1 10*3/uL (ref 1.7–7.7)
Neutrophils Relative %: 87 %
Platelets: 146 10*3/uL — ABNORMAL LOW (ref 150–400)
RBC: 3.63 MIL/uL — ABNORMAL LOW (ref 3.87–5.11)
RDW: 12.6 % (ref 11.5–15.5)
WBC: 4.8 10*3/uL (ref 4.0–10.5)
nRBC: 0 % (ref 0.0–0.2)

## 2020-12-24 LAB — MAGNESIUM: Magnesium: 1.5 mg/dL — ABNORMAL LOW (ref 1.7–2.4)

## 2020-12-24 LAB — COMPREHENSIVE METABOLIC PANEL
ALT: 12 U/L (ref 0–44)
AST: 18 U/L (ref 15–41)
Albumin: 3.9 g/dL (ref 3.5–5.0)
Alkaline Phosphatase: 66 U/L (ref 38–126)
Anion gap: 10 (ref 5–15)
BUN: 21 mg/dL (ref 8–23)
CO2: 24 mmol/L (ref 22–32)
Calcium: 9.2 mg/dL (ref 8.9–10.3)
Chloride: 107 mmol/L (ref 98–111)
Creatinine, Ser: 0.85 mg/dL (ref 0.44–1.00)
GFR, Estimated: >60 mL/min (ref >60)
Glucose, Bld: 129 mg/dL — ABNORMAL HIGH (ref 70–99)
Potassium: 3.9 mmol/L (ref 3.5–5.1)
Sodium: 141 mmol/L (ref 135–145)
Total Bilirubin: 0.6 mg/dL (ref 0.3–1.2)
Total Protein: 7.3 g/dL (ref 6.5–8.1)

## 2020-12-24 LAB — TROPONIN I (HIGH SENSITIVITY)
Troponin I (High Sensitivity): 4 ng/L (ref <18)
Troponin I (High Sensitivity): 2 ng/L (ref <18)

## 2020-12-24 NOTE — ED Notes (Signed)
Seen at cancer center today for hand hematoma-PA noticed low HR-history of PVC's-complaining of dizziness

## 2020-12-24 NOTE — Progress Notes (Signed)
[facility] Progress Note   Tonya [name] [id number] July 24, 1946 75 y.o.  Tonya [name] is managed by Dr. Fanny Bien. Mohamed  Actively treated with chemotherapy/immunotherapy/hormonal therapy: yes  Current therapy: Cisplatin and Alimta  Last treated: 12/18/2020 (cycle 3, day 1)  Next scheduled appointment with provider: 01/08/2021  Assessment: Plan:    Bradycardia - Plan: EKG 12-Lead  Adenocarcinoma of right lung, stage 2 (HCC)   Bradycardia: An EKG was completed but was not interpretable.  The patient was referred to the emergency room for evaluation given that her pulse rate was 37-40 at rest.  Stage II adenocarcinoma of the right lung: The patient is status post cycle for cycle 3, day 1 of cisplatin and Alimta which was dosed on 12/18/2020.  She is scheduled to return to see Dr. Julien Nordmann for follow-up on 01/08/2021.  Please see After Visit Summary for patient specific instructions.  Future Appointments  Date Time Provider [place]  12/31/2020  8:30 AM [facility] None  01/01/2021  9:45 AM Patwardhan, Reynold Bowen, MD [facility] None  01/03/2021  9:40 AM [name], MD [facility]  01/08/2021  8:45 AM [facility] None  01/08/2021  9:15 AM Curt Bears, MD [facility] None  01/08/2021 10:00 AM [facility] INFUSION [facility] None  01/15/2021  9:15 AM Icard, Octavio Graves, DO [facility] None    Orders Placed This Encounter  Procedures  . EKG 12-Lead       Subjective:   Patient ID:  Tonya [name] is a 74 y.o. (DOB 21-Apr-1946) female.  Chief Complaint: No chief complaint on file.   HPI Tonya [name] is a 75 y.o. female with a diagnosis of a stage II adenocarcinoma of the right lung.  She is followed by Dr. Fanny Bien. Mohamed and is status post cycle for cycle 3, day 1 of cisplatin and Alimta which was dosed on 12/18/2020.  She presents to the clinic today with a  report of swelling in her left hand wrist and forearm after her last treatment of chemotherapy.  The area is tender.  Her symptoms began on 12/21/2020.  She does not have a port.  Her chemotherapy has been given via a peripheral IV.  She reports having nausea despite taking her nausea medications.  She has been drinking ginger tea in addition to taking her antiemetics.  She reports feeling weak.  She denies positional vertigo or dizziness.  On evaluation today it was noted that her pulse rate was 37 when seated and was 85 with standing.  Medications: I have reviewed the patient's current medications.  Allergies:  Allergies  Allergen Reactions  . Other Anaphylaxis    Spiders  . Penicillins Hives    REACTION: 20 years    Past Medical History:  Diagnosis Date  . Adenoma of left adrenal gland   . Cancer ([place])    Lung  . Colon polyps    hyperplastic  . Complication of anesthesia    Very emotional and cries after anesthesia  . GERD (gastroesophageal reflux disease)   . Kidney stone   . Liver lesion   . Microhematuria     Past Surgical History:  Procedure Laterality Date  . ABDOMINAL HYSTERECTOMY    . APPENDECTOMY    . BRONCHIAL BIOPSY  07/18/2020   Procedure: BRONCHIAL BIOPSIES;  Surgeon: [name], DO;  Location: [facility];  Service: Pulmonary;;  . BRONCHIAL BRUSHINGS  07/18/2020   Procedure: BRONCHIAL BRUSHINGS;  Surgeon: [name],  DO;  Location: [facility];  Service: Pulmonary;;  . BRONCHIAL NEEDLE ASPIRATION BIOPSY  07/18/2020   Procedure: BRONCHIAL NEEDLE ASPIRATION BIOPSIES;  Surgeon: [name], DO;  Location: [facility];  Service: Pulmonary;;  . BRONCHIAL WASHINGS  07/18/2020   Procedure: BRONCHIAL WASHINGS;  Surgeon: [name], DO;  Location: [facility];  Service: Pulmonary;;  . FIDUCIAL MARKER PLACEMENT  07/18/2020   Procedure: FIDUCIAL MARKER PLACEMENT;  Surgeon: [name], DO;  Location: [facility];  Service: Pulmonary;;  .  INTERCOSTAL NERVE BLOCK Right 08/26/2020   Procedure: INTERCOSTAL NERVE BLOCK;  Surgeon: [name], MD;  Location: [facility];  Service: Thoracic;  Laterality: Right;  . IR THORACENTESIS ASP PLEURAL SPACE W/IMG GUIDE  10/10/2020  . NODE DISSECTION Right 08/26/2020   Procedure: NODE DISSECTION;  Surgeon: [name], MD;  Location: [facility];  Service: Thoracic;  Laterality: Right;  . THORACENTESIS Right 11/04/2020   Procedure: THORACENTESIS;  Surgeon: [name], DO;  Location: [facility];  Service: Pulmonary;  Laterality: Right;  . THORACENTESIS Right 11/29/2020   Procedure: THORACENTESIS;  Surgeon: [name], DO;  Location: [facility];  Service: Pulmonary;  Laterality: Right;  Marland Kitchen VIDEO BRONCHOSCOPY WITH ENDOBRONCHIAL NAVIGATION N/A 07/18/2020   Procedure: VIDEO BRONCHOSCOPY WITH ENDOBRONCHIAL NAVIGATION;  Surgeon: [name], DO;  Location: [facility];  Service: Pulmonary;  Laterality: N/A;  . VIDEO BRONCHOSCOPY WITH ENDOBRONCHIAL ULTRASOUND  07/18/2020   Procedure: VIDEO BRONCHOSCOPY WITH ENDOBRONCHIAL ULTRASOUND;  Surgeon: [name], DO;  Location: [facility];  Service: Pulmonary;;    Family History  Problem Relation Age of Onset  . Arrhythmia Mother        s/p Pacer   . Heart attack Father   . Heart disease Sister   . Colon cancer Neg Hx     Social History   Socioeconomic History  . Marital status: Married    Spouse name: Not on file  . Number of children: 3  . Years of education: Not on file  . Highest education level: Not on file  Occupational History  . Not on file  Tobacco Use  . Smoking status: Former Smoker    Packs/day: 0.30    Years: 60.00    Pack years: 18.00    Types: Cigarettes    Quit date: 06/11/2020    Years since quitting: 0.5  . Smokeless tobacco: Never Used  Vaping Use  . Vaping Use: Never used  Substance and Sexual Activity  . Alcohol use: Yes    Comment: 0.5 drink per month  . Drug use: No  . Sexual  activity: Not Currently    Birth control/protection: Surgical    Comment: Hysterectomy  Other Topics Concern  . 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    Social Determinants of Health   Financial Resource Strain: Not on file  Food Insecurity: Not on file  Transportation Needs: Not on file  Physical Activity: Not on file  Stress: Not on file  Social Connections: Not on file  Intimate Partner Violence: Not on file    Past Medical History, Surgical history, Social history, and Family history were reviewed and updated as appropriate.   Please see review of systems for further details on the patient's review from today.   Review of Systems:  Review of Systems  Constitutional: Positive for fatigue. Negative for chills, diaphoresis and fever.  HENT: Negative for trouble swallowing and voice change.   Respiratory:  Negative for cough, chest tightness, shortness of breath and wheezing.   Cardiovascular: Negative for chest pain and palpitations.  Gastrointestinal: Negative for abdominal pain, constipation, diarrhea, nausea and vomiting.  Musculoskeletal: Positive for joint swelling. Negative for back pain and myalgias.       Swelling and tenderness of the left hand, wrist, and forearm.  Neurological: Positive for weakness. Negative for dizziness, light-headedness and headaches.    Objective:   Physical Exam:  BP (!) 160/73 (BP Location: Left Arm, Patient Position: Sitting)   Pulse 69   Temp 97.9 F (36.6 C) (Tympanic)   Resp 15   Ht 5\' 4"  (1.626 m)   Wt 139 lb 6.4 oz (63.2 kg)   SpO2 100%   BMI 23.93 kg/m  ECOG: 1  Physical Exam Constitutional:      General: She is not in acute distress.    Appearance: She is not ill-appearing, toxic-appearing or diaphoretic.  HENT:     Head: Normocephalic and atraumatic.  Eyes:     General: No scleral icterus.       Right eye: No discharge.        Left eye: No discharge.     Conjunctiva/sclera: Conjunctivae  normal.  Cardiovascular:     Rate and Rhythm: Bradycardia present. Rhythm irregular.     Heart sounds: No murmur heard. No friction rub. No gallop.   Pulmonary:     Effort: Pulmonary effort is normal. No respiratory distress.     Breath sounds: Normal breath sounds. No wheezing, rhonchi or rales.  Musculoskeletal:        General: Swelling and tenderness present.     Comments: Swollen left hand, wrist, and forearm.  Neurological:     Mental Status: She is alert.     Coordination: Coordination normal.     Gait: Gait normal.  Psychiatric:        Mood and Affect: Mood normal.        Behavior: Behavior normal.        Thought Content: Thought content normal.        Judgment: Judgment normal.     Lab Review:     Component Value Date/Time   NA 141 12/24/2020 1232   K 3.9 12/24/2020 1232   CL 107 12/24/2020 1232   CO2 24 12/24/2020 1232   GLUCOSE 129 (H) 12/24/2020 1232   BUN 21 12/24/2020 1232   CREATININE 0.85 12/24/2020 1232   CREATININE 0.86 12/24/2020 0905   CALCIUM 9.2 12/24/2020 1232   PROT 7.3 12/24/2020 1232   ALBUMIN 3.9 12/24/2020 1232   AST 18 12/24/2020 1232   AST 12 (L) 12/24/2020 0905   ALT 12 12/24/2020 1232   ALT 7 12/24/2020 0905   ALKPHOS 66 12/24/2020 1232   BILITOT 0.6 12/24/2020 1232   BILITOT 0.3 12/24/2020 0905   GFRNONAA >60 12/24/2020 1232   GFRNONAA >60 12/24/2020 0905   GFRAA >60 04/01/2020 1706       Component Value Date/Time   WBC 4.8 12/24/2020 1232   WBC 5.1 12/24/2020 0905   RBC 3.63 (L) 12/24/2020 1232   HGB 11.3 (L) 12/24/2020 1232   HGB 11.3 (L) 12/24/2020 0905   HCT 34.9 (L) 12/24/2020 1232   PLT 146 (L) 12/24/2020 1232   PLT 141 (L) 12/24/2020 0905   MCV 96.1 12/24/2020 1232   MCH 31.1 12/24/2020 1232   MCHC 32.4 12/24/2020 1232   RDW 12.6 12/24/2020 1232   LYMPHSABS 0.5 (L) 12/24/2020 1232   MONOABS 0.1 12/24/2020  1232   EOSABS 0.0 12/24/2020 1232   BASOSABS 0.0 12/24/2020 1232    -------------------------------  Imaging from last 24 hours (if applicable):  Radiology interpretation: DG Chest 2 View  Result Date: 12/24/2020 CLINICAL DATA:  Swelling of left hand and forearm EXAM: CHEST - 2 VIEW COMPARISON:  11/29/2020 FINDINGS: Increased right pleural effusion with adjacent atelectasis. Postsurgical changes are present on the right. Stable appearance of left lung. Stable cardiomediastinal contours. IMPRESSION: Increased right pleural effusion with adjacent atelectasis. Electronically Signed   By: Macy Mis M.D.   On: 12/24/2020 14:18   DG Chest 2 View  Result Date: 11/26/2020 CLINICAL DATA:  History of pleural effusion. Adenocarcinoma of the right lung EXAM: CHEST - 2 VIEW COMPARISON:  11/01/2020, 11/04/2020 FINDINGS: Reaccumulation of right-sided pleural effusion, now moderate-sized. Persistent right perihilar opacity. Mild streaky left basilar opacity. Heart size within normal limits. No left-sided pleural effusion. No pneumothorax. IMPRESSION: 1. Reaccumulation of right-sided pleural effusion, now moderate-sized. 2. Persistent right perihilar opacity. 3. Probable mild left basilar atelectasis. Electronically Signed   By: Davina Poke D.O.   On: 11/26/2020 11:49   DG CHEST PORT 1 VIEW  Result Date: 11/29/2020 CLINICAL DATA:  Right pleural effusion status post thoracentesis EXAM: PORTABLE CHEST 1 VIEW COMPARISON:  11/26/2020 FINDINGS: Single frontal view of the chest demonstrates stable enlargement of the cardiac silhouette. Decreased right pleural effusion after reported right thoracentesis, with trace residual fluid at the right costophrenic angle. No evidence of pneumothorax. Postsurgical changes are seen within the right lung from partial pneumonectomy. Continued nodularity at the left apex with fiduciary markers again noted. Chronic interstitial scarring noted throughout the lungs bilaterally. IMPRESSION: 1. No complication after right thoracentesis. Trace  residual pleural effusion. 2. Stable left upper lobe nodularity with fiduciary markers unchanged. Electronically Signed   By: Randa Ngo M.D.   On: 11/29/2020 15:36   UE Venous Duplex ([facility] and [facility] ONLY)  Result Date: 12/24/2020 UPPER VENOUS STUDY  Indications: Swelling of hand and forearm post infusion Risk Factors: Cancer Adenocarcinoma of lung stage 2 Transfusion on 4/13 using LT hand IV access, swelling began on 4/15. Comparison Study: No previous exams Performing Technologist: Rogelia Rohrer  Examination Guidelines: A complete evaluation includes B-mode imaging, spectral Doppler, color Doppler, and power Doppler as needed of all accessible portions of each vessel. Bilateral testing is considered an integral part of a complete examination. Limited examinations for reoccurring indications may be performed as noted.  Right Findings: +----------+------------+---------+-----------+----------+-------+ RIGHT     CompressiblePhasicitySpontaneousPropertiesSummary +----------+------------+---------+-----------+----------+-------+ Subclavian    Full       Yes       Yes                      +----------+------------+---------+-----------+----------+-------+  Left Findings: +----------+------------+---------+-----------+----------+-------+ LEFT      CompressiblePhasicitySpontaneousPropertiesSummary +----------+------------+---------+-----------+----------+-------+ IJV           Full       Yes       Yes                      +----------+------------+---------+-----------+----------+-------+ Subclavian    Full       Yes       Yes                      +----------+------------+---------+-----------+----------+-------+ Axillary      Full       Yes       Yes                      +----------+------------+---------+-----------+----------+-------+  Brachial      Full       Yes       Yes                      +----------+------------+---------+-----------+----------+-------+ Radial         Full                                          +----------+------------+---------+-----------+----------+-------+ Ulnar         Full                                          +----------+------------+---------+-----------+----------+-------+ Cephalic      Full                                          +----------+------------+---------+-----------+----------+-------+ Basilic       Full       Yes       Yes                      +----------+------------+---------+-----------+----------+-------+ 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  Summary:  Right: No evidence of thrombosis in the subclavian.  Left: No evidence of deep vein thrombosis in the upper extremity. 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  *See table(s) above for measurements and observations.  Diagnosing physician: Curt Jews MD Electronically signed by Curt Jews MD on 12/24/2020 at 4:58:13 PM.    Final         This case was discussed with Dr. Julien Nordmann. He expressed agreement with my management of this patient.

## 2020-12-24 NOTE — ED Triage Notes (Signed)
Pt brought by Sandi Mealy, PA from [facility]. Pt has lung cancer, finished third cycle of chemo 4/13. Pt c/o swelling to left hand and left arm. Pt c/o dizziness. PA states HR was 37 while sitting, 85 while standing. BP stayed steady. PA reports pt has low HR. PT had Mag given in [place] today. Pt has hx of PVC's but no hx of bradycardia.

## 2020-12-24 NOTE — ED Provider Notes (Signed)
[place] DEPT Provider Note   CSN: [id number] Arrival date & time: 12/24/20  1211     History Chief Complaint  Patient presents with  . Bradycardia    Tonya [name] is a 75 y.o. female.  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  Her course has been complicated recently by pleural effusion necessitating thoracentesis.  She presented with left hand pain and swelling.  She received a chemoinfusion in this arm.  Shortly after the infusion, which was done last week, she developed some swelling of the left hand and forearm.  The swelling was so severe that she had blisters on her fingers.  When she went to her doctor to have it evaluated, her heart rate was noted to be quite low, and she was sent for further evaluation.  She states that she has had no symptoms related to feeling weak, tired, short of breath, or presyncopal.  The history is provided by the patient.  Arm Injury Location:  Hand and arm Arm location:  L forearm Hand location:  [place] of L hand Injury: no   Pain details:    Quality:  Throbbing   Radiates to:  Does not radiate   Severity:  Mild   Onset quality:  Gradual   Timing:  Constant   Progression:  Improving Dislocation: no   Prior injury to area:  No Relieved by:  Elevation Worsened by:  Nothing Ineffective treatments:  Rest Associated symptoms: swelling   Associated symptoms: no back pain, no fatigue, no fever, no neck pain, no numbness and no stiffness        Past Medical History:  Diagnosis Date  . Adenoma of left adrenal gland   . Cancer ([place])    Lung  . Colon polyps    hyperplastic  . Complication of anesthesia    Very emotional and cries after anesthesia  . GERD (gastroesophageal reflux disease)   . Kidney stone   . Liver lesion   . Microhematuria     Patient Active Problem List   Diagnosis Date Noted  . Hypomagnesemia 12/10/2020  . Nausea without vomiting  12/10/2020  . Status post thoracentesis   . Pleural effusion, right 11/01/2020  . Adenocarcinoma of right lung, stage 2 ([place]) 09/11/2020  . Encounter for antineoplastic chemotherapy 09/11/2020  . S/P Robotic Assited Video Thoracoscopy with Right Upper Lobectomy Lung 08/26/2020  . Mediastinal adenopathy 07/18/2020  . Lung nodules   . Lung nodule 06/14/2020  . 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  . Tobacco dependence 05/29/2020  . Exertional chest pain 05/28/2020  . Numbness and tingling of left arm and leg 07/01/2012  . Hyperlipidemia LDL goal < 100 07/01/2012  . Chest pressure 06/30/2012  . GERD (gastroesophageal reflux disease) 06/30/2012    Past Surgical History:  Procedure Laterality Date  . ABDOMINAL HYSTERECTOMY    . APPENDECTOMY    . BRONCHIAL BIOPSY  07/18/2020   Procedure: BRONCHIAL BIOPSIES;  Surgeon: [name], DO;  Location: [facility];  Service: Pulmonary;;  . BRONCHIAL BRUSHINGS  07/18/2020   Procedure: BRONCHIAL BRUSHINGS;  Surgeon: [name], DO;  Location: [facility];  Service: Pulmonary;;  . BRONCHIAL NEEDLE ASPIRATION BIOPSY  07/18/2020   Procedure: BRONCHIAL NEEDLE ASPIRATION BIOPSIES;  Surgeon: [name], DO;  Location: [facility];  Service: Pulmonary;;  . BRONCHIAL WASHINGS  07/18/2020   Procedure: BRONCHIAL WASHINGS;  Surgeon: [name], DO;  Location: [facility];  Service: Pulmonary;;  . FIDUCIAL  MARKER PLACEMENT  07/18/2020   Procedure: FIDUCIAL MARKER PLACEMENT;  Surgeon: [name], DO;  Location: [facility];  Service: Pulmonary;;  . INTERCOSTAL NERVE BLOCK Right 08/26/2020   Procedure: INTERCOSTAL NERVE BLOCK;  Surgeon: [name], MD;  Location: [facility];  Service: Thoracic;  Laterality: Right;  . IR THORACENTESIS ASP PLEURAL SPACE W/IMG GUIDE  10/10/2020  . NODE DISSECTION Right 08/26/2020   Procedure: NODE DISSECTION;  Surgeon: [name], MD;  Location: [facility];  Service: Thoracic;  Laterality:  Right;  . THORACENTESIS Right 11/04/2020   Procedure: THORACENTESIS;  Surgeon: [name], DO;  Location: [facility];  Service: Pulmonary;  Laterality: Right;  . THORACENTESIS Right 11/29/2020   Procedure: THORACENTESIS;  Surgeon: [name], DO;  Location: [facility];  Service: Pulmonary;  Laterality: Right;  Marland Kitchen VIDEO BRONCHOSCOPY WITH ENDOBRONCHIAL NAVIGATION N/A 07/18/2020   Procedure: VIDEO BRONCHOSCOPY WITH ENDOBRONCHIAL NAVIGATION;  Surgeon: [name], DO;  Location: [facility];  Service: Pulmonary;  Laterality: N/A;  . VIDEO BRONCHOSCOPY WITH ENDOBRONCHIAL ULTRASOUND  07/18/2020   Procedure: VIDEO BRONCHOSCOPY WITH ENDOBRONCHIAL ULTRASOUND;  Surgeon: [name], DO;  Location: [place];  Service: Pulmonary;;     OB History   No obstetric history on file.     Family History  Problem Relation Age of Onset  . Arrhythmia Mother        s/p Pacer   . Heart attack Father   . Heart disease Sister   . Colon cancer Neg Hx     Social History   Tobacco Use  . Smoking status: Former Smoker    Packs/day: 0.30    Years: 60.00    Pack years: 18.00    Types: Cigarettes    Quit date: 06/11/2020    Years since quitting: 0.5  . Smokeless tobacco: Never Used  Vaping Use  . Vaping Use: Never used  Substance Use Topics  . Alcohol use: Yes    Comment: 0.5 drink per month  . Drug use: No    Home Medications Prior to Admission medications   Medication Sig Start Date End Date Taking? Authorizing Provider  acetaminophen (TYLENOL) 500 MG tablet Take 2 tablets (1,000 mg total) by mouth every 6 (six) hours as needed. Patient taking differently: Take 1,000 mg by mouth every 6 (six) hours as needed for mild pain or moderate pain. 08/27/20   Barrett, Erin R, PA-C  albuterol (VENTOLIN HFA) 108 (90 Base) MCG/ACT inhaler Inhale 1-2 puffs into the lungs every 4 (four) hours as needed for wheezing or shortness of breath.    [provider]  alendronate  (FOSAMAX) 70 MG tablet Take 70 mg by mouth every Saturday. 07/04/20   [provider]  Calcium Carbonate-Vitamin D (CALCIUM + D PO) Take 1 tablet by mouth 2 (two) times daily.    [provider]  cyclobenzaprine (FLEXERIL) 5 MG tablet Take 5 mg by mouth 3 (three) times daily as needed for muscle spasms (sciatic nerve).  07/02/20   [provider]  dexamethasone (DECADRON) 2 MG tablet Take 4 mg by mouth See admin instructions. Take 4 mg mouth twice daily the day before, day of and 4 days after  the chemotherapy every 3 weeks. 10/10/20   [provider]  folic acid (FOLVITE) 1 MG tablet Take 1 tablet (1 mg total) by mouth daily. Patient taking differently: Take 1 mg by mouth daily at 12 noon. 10/10/20   Curt Bears, MD  guaiFENesin [facility]) 600 MG  12 hr tablet Take 2 tablets (1,200 mg total) by mouth 2 (two) times daily as needed. Patient taking differently: Take 1,200 mg by mouth 2 (two) times daily as needed for to loosen phlegm. 08/29/20   Barrett, Erin R, PA-C  ibuprofen (ADVIL) 800 MG tablet Take 1 tablet (800 mg total) by mouth every 8 (eight) hours as needed for moderate pain. 08/29/20   Barrett, Erin R, PA-C  Lidocaine 4 % PTCH Place 1 patch onto the skin daily as needed (sciatic nerve pain).    [provider]  LORazepam (ATIVAN) 0.5 MG tablet Take 1 tablet (0.5 mg total) by mouth daily as needed for anxiety. And nausea. 12/17/20   Curt Bears, MD  magnesium oxide (MAG-OX) 400 (241.3 Mg) MG tablet Take 1 tablet (400 mg total) by mouth 2 (two) times daily. 11/26/20   Curt Bears, MD  naproxen (NAPROSYN) 375 MG tablet Take 1 tablet (375 mg total) by mouth 2 (two) times daily as needed for moderate pain. 04/02/20   Petrucelli, Samantha R, PA-C  omeprazole (PRILOSEC) 40 MG capsule Take 40 mg by mouth 2 (two) times daily.    [provider]  ondansetron (ZOFRAN) 8 MG tablet Take 1 tablet (8 mg total) by mouth every 8 (eight) hours as  needed for nausea or vomiting. Starting at least 3 days after chemotherapy 12/10/20   Heilingoetter, Cassandra L, PA-C  oxyCODONE-acetaminophen (PERCOCET) 10-325 MG tablet Take 1 tablet by mouth every 8 (eight) hours as needed (sciatic nerve pain).    [provider]  pregabalin (LYRICA) 25 MG capsule Take 1 capsule (25 mg total) by mouth 2 (two) times daily. 11/01/20   Lightfoot, Lucile Crater, MD  prochlorperazine (COMPAZINE) 10 MG tablet TAKE 1 TABLET(10 MG) BY MOUTH EVERY 6 HOURS AS NEEDED FOR NAUSEA OR VOMITING Patient taking differently: Take 10 mg by mouth every 6 (six) hours as needed for nausea or vomiting. 11/28/20   Curt Bears, MD  rosuvastatin (CRESTOR) 10 MG tablet Take 10 mg by mouth at bedtime. 10/25/20   [provider]    Allergies    Other and Penicillins  Review of Systems   Review of Systems  Constitutional: Negative for chills, fatigue and fever.  HENT: Negative for ear pain and sore throat.   Eyes: Negative for pain and visual disturbance.  Respiratory: Negative for cough and shortness of breath.   Cardiovascular: Negative for chest pain and palpitations.  Gastrointestinal: Negative for abdominal pain and vomiting.  Genitourinary: Negative for dysuria and hematuria.  Musculoskeletal: Negative for arthralgias, back pain, neck pain and stiffness.  Skin: Negative for color change and rash.  Neurological: Negative for seizures and syncope.  All other systems reviewed and are negative.   Physical Exam Updated Vital Signs BP (!) 150/92   Pulse 75   Resp 19   SpO2 96%   Physical Exam Vitals and nursing note reviewed.  Constitutional:      General: She is not in acute distress.    Appearance: She is well-developed.  HENT:     Head: Normocephalic and atraumatic.  Eyes:     Conjunctiva/sclera: Conjunctivae normal.  Cardiovascular:     Rate and Rhythm: Normal rate and regular rhythm.     Heart sounds: No murmur heard.   Pulmonary:     Effort:  Pulmonary effort is normal. No respiratory distress.     Breath sounds: Normal breath sounds.  Abdominal:     Palpations: Abdomen is soft.     Tenderness:  There is no abdominal tenderness.  Musculoskeletal:     Cervical back: Neck supple.     Comments: There is edema of the left hand and forearm.  Pulses are good.  Tendon function is normal.  Skin:    General: Skin is warm and dry.  Neurological:     General: No focal deficit present.     Mental Status: She is alert.  Psychiatric:        Mood and Affect: Mood normal.     ED Results / Procedures / Treatments   Labs (all labs ordered are listed, but only abnormal results are displayed) Labs Reviewed  COMPREHENSIVE METABOLIC PANEL - Abnormal; Notable for the following components:      Result Value   Glucose, Bld 129 (*)    All other components within normal limits  CBC WITH DIFFERENTIAL/PLATELET - Abnormal; Notable for the following components:   RBC 3.63 (*)    Hemoglobin 11.3 (*)    HCT 34.9 (*)    Platelets 146 (*)    Lymphs Abs 0.5 (*)    All other components within normal limits  TROPONIN I (HIGH SENSITIVITY)  TROPONIN I (HIGH SENSITIVITY)    EKG EKG Interpretation  Date/Time:  Tuesday December 24 2020 12:38:50 EDT Ventricular Rate:  58 PR Interval:  151 QRS Duration: 107 QT Interval:  402 QTC Calculation: 395 R Axis:   7 Text Interpretation: Sinus rhythm Supraventricular bigeminy Nonspecific repol abnormality, diffuse leads artifact affects interpretation no acute ischemia Confirmed by Lorre Munroe ([id number]) on 12/24/2020 3:33:41 PM   Radiology DG Chest 2 View  Result Date: 12/24/2020 CLINICAL DATA:  Swelling of left hand and forearm EXAM: CHEST - 2 VIEW COMPARISON:  11/29/2020 FINDINGS: Increased right pleural effusion with adjacent atelectasis. Postsurgical changes are present on the right. Stable appearance of left lung. Stable cardiomediastinal contours. IMPRESSION: Increased right pleural effusion with adjacent  atelectasis. Electronically Signed   By: Macy Mis M.D.   On: 12/24/2020 14:18   UE Venous Duplex ([facility] and [facility] ONLY)  Result Date: 12/24/2020 UPPER VENOUS STUDY  Indications: Swelling of hand and forearm post infusion Risk Factors: Cancer Adenocarcinoma of lung stage 2 Transfusion on 4/13 using LT hand IV access, swelling began on 4/15. Comparison Study: No previous exams Performing Technologist: Rogelia Rohrer  Examination Guidelines: A complete evaluation includes B-mode imaging, spectral Doppler, color Doppler, and power Doppler as needed of all accessible portions of each vessel. Bilateral testing is considered an integral part of a complete examination. Limited examinations for reoccurring indications may be performed as noted.  Right Findings: +----------+------------+---------+-----------+----------+-------+ RIGHT     CompressiblePhasicitySpontaneousPropertiesSummary +----------+------------+---------+-----------+----------+-------+ Subclavian    Full       Yes       Yes                      +----------+------------+---------+-----------+----------+-------+  Left Findings: +----------+------------+---------+-----------+----------+-------+ LEFT      CompressiblePhasicitySpontaneousPropertiesSummary +----------+------------+---------+-----------+----------+-------+ IJV           Full       Yes       Yes                      +----------+------------+---------+-----------+----------+-------+ Subclavian    Full       Yes       Yes                      +----------+------------+---------+-----------+----------+-------+ Axillary      Full  Yes       Yes                      +----------+------------+---------+-----------+----------+-------+ Brachial      Full       Yes       Yes                      +----------+------------+---------+-----------+----------+-------+ Radial        Full                                           +----------+------------+---------+-----------+----------+-------+ Ulnar         Full                                          +----------+------------+---------+-----------+----------+-------+ Cephalic      Full                                          +----------+------------+---------+-----------+----------+-------+ Basilic       Full       Yes       Yes                      +----------+------------+---------+-----------+----------+-------+ 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  Summary:  Right: No evidence of thrombosis in the subclavian.  Left: No evidence of deep vein thrombosis in the upper extremity. 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  *See table(s) above for measurements and observations.    Preliminary     Procedures Procedures   Medications Ordered in ED Medications - No data to display  ED Course  I have reviewed the triage vital signs and the nursing notes.  Pertinent labs & imaging results that were available during my care of the patient were reviewed by me and considered in my medical decision making (see chart for details).    MDM Rules/Calculators/A&P                          [name] presented with bradycardia and hand swelling.  She was diagnosed with superficial thrombophlebitis but has definitely had some bradycardia with low heart rates.  We are currently awaiting cardiology consult. Final Clinical Impression(s) / ED Diagnoses Final diagnoses:  Bradycardia  Superficial thrombophlebitis of left upper extremity    Rx / DC Orders ED Discharge Orders    None       Arnaldo Natal, MD 12/24/20 1615

## 2020-12-24 NOTE — ED Notes (Signed)
Pt HR ranging between 34-72 bpm. Pt A&Ox4. Pt speaking to staff, pt denies dizziness at this time. Dr. Joya Gaskins made aware.

## 2020-12-24 NOTE — Discharge Instructions (Addendum)
Follow-up with Dr. Terri Skains at your cardiology group next Wednesday at 945.  Return to the emergency department for any symptoms.

## 2020-12-24 NOTE — Progress Notes (Signed)
LUE venous duplex completed.  Dr. Joya Gaskins given preliminary findings at 1420.  Results can be found under chart review under CV PROC. 12/24/2020 3:17 PM Buelah Rennie RVT, RDMS

## 2020-12-24 NOTE — ED Provider Notes (Signed)
I spoke with the cardiologist Dr.Tolia and he reviewed the patient's EKG medicines and blood work.  The patient has had normal blood pressure.  She is here now her heart rate is in the 60s slightly irregular.  The cardiologist decided that the patient could go home and follow-up next week in their office she is told to return sooner if problems   Milton Ferguson, MD 12/24/20 1657

## 2020-12-26 ENCOUNTER — Other Ambulatory Visit: Payer: Self-pay | Admitting: Thoracic Surgery (Cardiothoracic Vascular Surgery)

## 2020-12-27 ENCOUNTER — Other Ambulatory Visit: Payer: Self-pay

## 2020-12-27 DIAGNOSIS — M792 Neuralgia and neuritis, unspecified: Secondary | ICD-10-CM

## 2020-12-27 MED ORDER — PREGABALIN 25 MG PO CAPS: 25.0000 mg | ORAL_CAPSULE | Freq: Two times a day (BID) | ORAL | 1 refills | Status: DC

## 2020-12-30 ENCOUNTER — Other Ambulatory Visit: Payer: Self-pay | Admitting: Internal Medicine

## 2020-12-31 ENCOUNTER — Inpatient Hospital Stay: Payer: Medicare HMO

## 2020-12-31 ENCOUNTER — Ambulatory Visit: Payer: Medicare HMO | Admitting: Physician Assistant

## 2020-12-31 ENCOUNTER — Other Ambulatory Visit: Payer: Self-pay

## 2020-12-31 ENCOUNTER — Ambulatory Visit: Payer: Medicare HMO

## 2020-12-31 DIAGNOSIS — C3491 Malignant neoplasm of unspecified part of right bronchus or lung: Secondary | ICD-10-CM

## 2020-12-31 DIAGNOSIS — Z5111 Encounter for antineoplastic chemotherapy: Secondary | ICD-10-CM | POA: Diagnosis not present

## 2020-12-31 LAB — CMP (CANCER CENTER ONLY)
ALT: 6 U/L (ref 0–44)
AST: 12 U/L — ABNORMAL LOW (ref 15–41)
Albumin: 3.3 g/dL — ABNORMAL LOW (ref 3.5–5.0)
Alkaline Phosphatase: 63 U/L (ref 38–126)
Anion gap: 8 (ref 5–15)
BUN: 12 mg/dL (ref 8–23)
CO2: 26 mmol/L (ref 22–32)
Calcium: 8.7 mg/dL — ABNORMAL LOW (ref 8.9–10.3)
Chloride: 109 mmol/L (ref 98–111)
Creatinine: 0.74 mg/dL (ref 0.44–1.00)
GFR, Estimated: >60 mL/min (ref >60)
Glucose, Bld: 84 mg/dL (ref 70–99)
Potassium: 3.8 mmol/L (ref 3.5–5.1)
Sodium: 143 mmol/L (ref 135–145)
Total Bilirubin: 0.3 mg/dL (ref 0.3–1.2)
Total Protein: 5.9 g/dL — ABNORMAL LOW (ref 6.5–8.1)

## 2020-12-31 LAB — CBC WITH DIFFERENTIAL (CANCER CENTER ONLY)
Abs Immature Granulocytes: 0 10*3/uL (ref 0.00–0.07)
Basophils Absolute: 0 10*3/uL (ref 0.0–0.1)
Basophils Relative: 0 %
Eosinophils Absolute: 0.1 10*3/uL (ref 0.0–0.5)
Eosinophils Relative: 2 %
HCT: 30.2 % — ABNORMAL LOW (ref 36.0–46.0)
Hemoglobin: 9.8 g/dL — ABNORMAL LOW (ref 12.0–15.0)
Immature Granulocytes: 0 %
Lymphocytes Relative: 41 %
Lymphs Abs: 1.5 10*3/uL (ref 0.7–4.0)
MCH: 31 pg (ref 26.0–34.0)
MCHC: 32.5 g/dL (ref 30.0–36.0)
MCV: 95.6 fL (ref 80.0–100.0)
Monocytes Absolute: 0.3 10*3/uL (ref 0.1–1.0)
Monocytes Relative: 8 %
Neutro Abs: 1.8 10*3/uL (ref 1.7–7.7)
Neutrophils Relative %: 49 %
Platelet Count: 140 10*3/uL — ABNORMAL LOW (ref 150–400)
RBC: 3.16 MIL/uL — ABNORMAL LOW (ref 3.87–5.11)
RDW: 12.4 % (ref 11.5–15.5)
WBC Count: 3.8 10*3/uL — ABNORMAL LOW (ref 4.0–10.5)
nRBC: 0 % (ref 0.0–0.2)

## 2020-12-31 LAB — MAGNESIUM: Magnesium: 1.5 mg/dL — ABNORMAL LOW (ref 1.7–2.4)

## 2021-01-01 ENCOUNTER — Encounter: Payer: Self-pay | Admitting: Cardiology

## 2021-01-01 ENCOUNTER — Ambulatory Visit: Payer: Medicare HMO | Admitting: Cardiology

## 2021-01-01 VITALS — BP 161/81 | HR 54 | Temp 98.7°F | Resp 16 | Ht 64.0 in | Wt 146.0 lb

## 2021-01-01 DIAGNOSIS — R001 Bradycardia, unspecified: Secondary | ICD-10-CM

## 2021-01-01 NOTE — Progress Notes (Signed)
Patient referred by Lin Landsman, MD for chest pain  Subjective:   Tonya [name], female    DOB: 06/28/1946, 75 y.o.   MRN: [id number]   Chief Complaint  Patient presents with  . Bradycardia  . Follow-up    ER    HPI  75 y.o. African American female with adenocarcinoma right lung, controlled hyperlipidemia, known ventricular bigeminy  Patient was seen in [facility] department in 2022 with complaints of left hand pain. Patient is currently undergoing chemotherapy for lung adenocarcinoma GERD.  Her course has been complicated by the patient is requiring paracentesis.  She noticed pain and swelling in her left hand.  She was found to have superficial thrombophlebitis.  However, she was also noted to have bradycardia while in the emergency department.  She was thus recommended to be reevaluated by me.  Patient denies any presyncope/syncope symptoms. Her biggest complaint recently has been nausea related to chemotherapy. She has one more chemotherapy session left, after which she would have radiation treatment.   Current Outpatient Medications on File Prior to Visit  Medication Sig Dispense Refill  . acetaminophen (TYLENOL) 500 MG tablet Take 2 tablets (1,000 mg total) by mouth every 6 (six) hours as needed. (Patient taking differently: Take 1,000 mg by mouth every 6 (six) hours as needed for mild pain or moderate pain.) 30 tablet 0  . albuterol (VENTOLIN HFA) 108 (90 Base) MCG/ACT inhaler Inhale 1-2 puffs into the lungs every 4 (four) hours as needed for wheezing or shortness of breath.    Marland Kitchen alendronate (FOSAMAX) 70 MG tablet Take 70 mg by mouth every Saturday.    . ALPRAZolam (XANAX) 1 MG tablet Take 1 mg by mouth in the morning, at noon, and at bedtime. LF on 11-28-20 # 90 DS 30    . Calcium Carbonate-Vitamin D (CALCIUM + D PO) Take 1 tablet by mouth 2 (two) times daily.    . cyclobenzaprine (FLEXERIL) 5 MG tablet Take 5 mg by mouth 3 (three) times daily as needed  for muscle spasms (sciatic nerve).     Marland Kitchen dexamethasone (DECADRON) 2 MG tablet Take 4 mg by mouth See admin instructions. Take 4 mg mouth twice daily the day before, day of and 4 days after  the chemotherapy every 3 weeks.    . folic acid (FOLVITE) 1 MG tablet Take 1 tablet (1 mg total) by mouth daily. (Patient taking differently: Take 1 mg by mouth daily at 12 noon.) 30 tablet 4  . guaiFENesin (MUCINEX) 600 MG 12 hr tablet Take 2 tablets (1,200 mg total) by mouth 2 (two) times daily as needed. (Patient taking differently: Take 1,200 mg by mouth 2 (two) times daily as needed for to loosen phlegm.)    . ibuprofen (ADVIL) 800 MG tablet Take 1 tablet (800 mg total) by mouth every 8 (eight) hours as needed for moderate pain. 30 tablet 0  . LORazepam (ATIVAN) 0.5 MG tablet Take 1 tablet (0.5 mg total) by mouth daily as needed for anxiety. And nausea. 15 tablet 0  . magnesium oxide (MAG-OX) 400 (241.3 Mg) MG tablet Take 1 tablet (400 mg total) by mouth 2 (two) times daily. 60 tablet 1  . naproxen (NAPROSYN) 375 MG tablet Take 1 tablet (375 mg total) by mouth 2 (two) times daily as needed for moderate pain. (Patient not taking: Reported on 12/24/2020) 8 tablet 0  . omeprazole (PRILOSEC) 40 MG capsule Take 40 mg by mouth 2 (two) times daily.    Marland Kitchen  ondansetron (ZOFRAN) 8 MG tablet Take 1 tablet (8 mg total) by mouth every 8 (eight) hours as needed for nausea or vomiting. Starting at least 3 days after chemotherapy 30 tablet 1  . oxyCODONE-acetaminophen (PERCOCET) 10-325 MG tablet Take 1 tablet by mouth every 8 (eight) hours as needed (sciatic nerve pain).    . pregabalin (LYRICA) 25 MG capsule Take 1 capsule (25 mg total) by mouth 2 (two) times daily. 60 capsule 1  . prochlorperazine (COMPAZINE) 10 MG tablet TAKE 1 TABLET(10 MG) BY MOUTH EVERY 6 HOURS AS NEEDED FOR NAUSEA OR VOMITING 30 tablet 0  . rosuvastatin (CRESTOR) 10 MG tablet Take 10 mg by mouth at bedtime.     No current facility-administered medications  on file prior to visit.    Cardiovascular and other pertinent studies:  EKG 01/01/2021: Sinus rhythm 55 bpm   Possible old anteroseptal infarct Nonspecific T wave abnormality  EKG 12/24/2020: Sinus rhythm 58 bpm Supraventricular bigeminy Nonspecific T wave abnormality  Echocardiogram 06/18/2020:  Left ventricle cavity is normal in size. Mild concentric hypertrophy of  the left ventricle. Normal LV systolic function with EF 57%. Normal global  wall motion. Normal diastolic filling pattern.  Left atrial cavity is mildly dilated.  Mild (Grade I) mitral regurgitation.Estimated RA pressure 8 mmHg.  Exercise Myoview stress test 06/10/2020: Exercise nuclear stress test was performed using Bruce protocol. Patient reached 7 METS, and 103% of age predicted maximum heart rate. Exercise capacity was fair. Chest pain not reported. Heart rate and hemodynamic response were normal. Stress EKG showed sinus tachycardia, ventricular bigeminy for 28 sec in stage 1/2, 1.5-2 mm horizontal ST depression in leads II, V4-V6, normalizing 2 min into recovery.   SPECT Images showed small sized mild intensity, reversible perfusion defect in basal anteroseptal myocardium. Stress LVEF 74%.  Intermediate risk study.    CT Cardiac scoring 06/11/2020: IMPRESSION: The total coronary calcium score is 0.  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 Recommend full CT scan of the chest with contrast to better evaluate.    EKG 05/29/2020: Sinus rhythm 69 bpm Old anteroseptal infarct Anterolateral T wave inversion, consider ischemia  Echocardiogram 2013: Left ventricle: The cavity size was normal. Wall thickness  was normal. Systolic function was normal. The estimated  ejection fraction was in the range of 60% to 65%.    Recent labs: 12/31/2020: Glucose 84, BUN/Cr 12/0.74. EGFR >60. Na/K 143/3.8. Rest of the CMP normal H/H  9.8/30.2. MCV 30. Platelets 140l  04/01/2020: Glucose 98, BUN/Cr 6/0.65. EGFR >60. Na/K 136/4.1.  Trop HS 3-->4 H/H 13/41. MCV 101. Platelets 245  01/11/2020: Glucose 133, BUN/Cr 11/0.66. EGFR 103. Na/K 139/4.1. Albumin 3.3, total protein 5.9. Rest of the CMP normal H/H 13.5/40.3. MCV 99.0. Platelets 228 Chol 135, TG 69, HDL 47, LDL 73 TSH 1.27 normal    Review of Systems  Cardiovascular: Positive for chest pain. Negative for dyspnea on exertion, leg swelling, palpitations and syncope.       Vitals:   01/01/21 0942  BP: (!) 161/81  Pulse: (!) 54  Resp: 16  Temp: 98.7 F (37.1 C)  SpO2: 96%     Body mass index is 25.06 kg/m. Filed Weights   01/01/21 0942  Weight: 146 lb (66.2 kg)     Objective:   Physical Exam Vitals and nursing note reviewed.  Constitutional:      General: She is not in acute distress. Neck:  Vascular: No JVD.  Cardiovascular:     Rate and Rhythm: Normal rate and regular rhythm.     Heart sounds: Normal heart sounds. No murmur heard.   Pulmonary:     Effort: Pulmonary effort is normal.     Breath sounds: Normal breath sounds. No wheezing or rales.         Assessment & Recommendations:   75 y.o. African American female with adenocarcinoma right lung, controlled hyperlipidemia, known ventricular bigeminy  Bradycardia: Perceived bradycardia most likely as result of known ventricular bigeminy. No symptoms related to bradycardia. Recommend watchful monitoring. No further testing needed at this time, If her HR stays consistently <40 bpm, recommend cardiac telemetry.     Nigel Mormon, MD Pager: [phone] Office: [phone]

## 2021-01-03 ENCOUNTER — Encounter: Payer: Self-pay | Admitting: Thoracic Surgery (Cardiothoracic Vascular Surgery)

## 2021-01-03 ENCOUNTER — Telehealth (INDEPENDENT_AMBULATORY_CARE_PROVIDER_SITE_OTHER): Payer: Medicare HMO | Admitting: Thoracic Surgery (Cardiothoracic Vascular Surgery)

## 2021-01-03 ENCOUNTER — Other Ambulatory Visit: Payer: Self-pay

## 2021-01-03 DIAGNOSIS — M792 Neuralgia and neuritis, unspecified: Secondary | ICD-10-CM | POA: Diagnosis not present

## 2021-01-03 MED ORDER — PREGABALIN 25 MG PO CAPS: 25.0000 mg | ORAL_CAPSULE | Freq: Two times a day (BID) | ORAL | 1 refills | Status: DC

## 2021-01-03 NOTE — Progress Notes (Signed)
[address]       [place] [address]             [phone]       Patient: Home Provider: Office Consent for Telemedicine visit obtained.  Today's visit was completed via a real-time telehealth (see specific modality noted below). The patient/authorized person provided oral consent at the time of the visit to engage in a telemedicine encounter with the present provider at [facility]. The patient/authorized person was informed of the potential benefits, limitations, and risks of telemedicine. The patient/authorized person expressed understanding that the laws that protect confidentiality also apply to telemedicine. The patient/authorized person acknowledged understanding that telemedicine does not provide emergency services and that he or she would need to call 911 or proceed to the nearest hospital for help if such a need arose.  . Total time spent in the clinical discussion 10 minutes. . Telehealth Modality: Phone visit (audio only)  I had a telephone visit with Mrs. [name].  She met with Dr. Valeta Harms for planned Pleurx catheter placement but ultrasound there was no significant fluid.  The procedure was then aborted.  From a respiratory standpoint she has been doing well.  She has had a tough time with the last chemotherapy.  She is optimistic about being complete with her therapy however.  Follow-up as needed.  The rest of her surveillance performed by medical oncology.

## 2021-01-08 ENCOUNTER — Inpatient Hospital Stay: Payer: Medicare HMO | Admitting: Nutrition

## 2021-01-08 ENCOUNTER — Inpatient Hospital Stay: Payer: Medicare HMO

## 2021-01-08 ENCOUNTER — Other Ambulatory Visit: Payer: Self-pay

## 2021-01-08 ENCOUNTER — Inpatient Hospital Stay: Payer: Medicare HMO | Attending: Internal Medicine | Admitting: Internal Medicine

## 2021-01-08 VITALS — BP 154/77 | HR 68 | Temp 98.0°F | Resp 19 | Ht 64.0 in | Wt 144.7 lb

## 2021-01-08 DIAGNOSIS — T451X5A Adverse effect of antineoplastic and immunosuppressive drugs, initial encounter: Secondary | ICD-10-CM

## 2021-01-08 DIAGNOSIS — C3491 Malignant neoplasm of unspecified part of right bronchus or lung: Secondary | ICD-10-CM

## 2021-01-08 DIAGNOSIS — Z5189 Encounter for other specified aftercare: Secondary | ICD-10-CM | POA: Insufficient documentation

## 2021-01-08 DIAGNOSIS — D701 Agranulocytosis secondary to cancer chemotherapy: Secondary | ICD-10-CM

## 2021-01-08 DIAGNOSIS — D709 Neutropenia, unspecified: Secondary | ICD-10-CM | POA: Insufficient documentation

## 2021-01-08 DIAGNOSIS — R5383 Other fatigue: Secondary | ICD-10-CM | POA: Insufficient documentation

## 2021-01-08 DIAGNOSIS — Z5111 Encounter for antineoplastic chemotherapy: Secondary | ICD-10-CM | POA: Diagnosis present

## 2021-01-08 DIAGNOSIS — C3411 Malignant neoplasm of upper lobe, right bronchus or lung: Secondary | ICD-10-CM | POA: Diagnosis present

## 2021-01-08 DIAGNOSIS — C3490 Malignant neoplasm of unspecified part of unspecified bronchus or lung: Secondary | ICD-10-CM

## 2021-01-08 DIAGNOSIS — R110 Nausea: Secondary | ICD-10-CM | POA: Diagnosis not present

## 2021-01-08 LAB — CMP (CANCER CENTER ONLY)
ALT: <6 U/L (ref 0–44)
AST: 13 U/L — ABNORMAL LOW (ref 15–41)
Albumin: 3.7 g/dL (ref 3.5–5.0)
Alkaline Phosphatase: 78 U/L (ref 38–126)
Anion gap: 11 (ref 5–15)
BUN: 14 mg/dL (ref 8–23)
CO2: 24 mmol/L (ref 22–32)
Calcium: 9.3 mg/dL (ref 8.9–10.3)
Chloride: 110 mmol/L (ref 98–111)
Creatinine: 0.89 mg/dL (ref 0.44–1.00)
GFR, Estimated: >60 mL/min (ref >60)
Glucose, Bld: 150 mg/dL — ABNORMAL HIGH (ref 70–99)
Potassium: 3.6 mmol/L (ref 3.5–5.1)
Sodium: 145 mmol/L (ref 135–145)
Total Bilirubin: 0.3 mg/dL (ref 0.3–1.2)
Total Protein: 6.9 g/dL (ref 6.5–8.1)

## 2021-01-08 LAB — CBC WITH DIFFERENTIAL (CANCER CENTER ONLY)
Abs Immature Granulocytes: 0 10*3/uL (ref 0.00–0.07)
Basophils Absolute: 0 10*3/uL (ref 0.0–0.1)
Basophils Relative: 0 %
Eosinophils Absolute: 0 10*3/uL (ref 0.0–0.5)
Eosinophils Relative: 0 %
HCT: 28.3 % — ABNORMAL LOW (ref 36.0–46.0)
Hemoglobin: 9.5 g/dL — ABNORMAL LOW (ref 12.0–15.0)
Immature Granulocytes: 0 %
Lymphocytes Relative: 37 %
Lymphs Abs: 0.6 10*3/uL — ABNORMAL LOW (ref 0.7–4.0)
MCH: 31.5 pg (ref 26.0–34.0)
MCHC: 33.6 g/dL (ref 30.0–36.0)
MCV: 93.7 fL (ref 80.0–100.0)
Monocytes Absolute: 0.2 10*3/uL (ref 0.1–1.0)
Monocytes Relative: 12 %
Neutro Abs: 0.8 10*3/uL — ABNORMAL LOW (ref 1.7–7.7)
Neutrophils Relative %: 51 %
Platelet Count: 289 10*3/uL (ref 150–400)
RBC: 3.02 MIL/uL — ABNORMAL LOW (ref 3.87–5.11)
RDW: 13.4 % (ref 11.5–15.5)
WBC Count: 1.5 10*3/uL — ABNORMAL LOW (ref 4.0–10.5)
nRBC: 0 % (ref 0.0–0.2)

## 2021-01-08 LAB — MAGNESIUM: Magnesium: 1.7 mg/dL (ref 1.7–2.4)

## 2021-01-08 MED ORDER — FILGRASTIM-AAFI 300 MCG/0.5ML IJ SOSY
300.0000 ug | PREFILLED_SYRINGE | Freq: Once | INTRAMUSCULAR | Status: AC
Start: 2021-01-08 — End: 2021-01-08
  Administered 2021-01-08: 300 ug via SUBCUTANEOUS
  Filled 2021-01-08: qty 0.5

## 2021-01-08 NOTE — Patient Instructions (Signed)

## 2021-01-08 NOTE — Progress Notes (Signed)
[facility] Telephone:([phone]) [phone]   Fax:(336) [place], [place] [address]  DIAGNOSIS: Stage IIB (T3, N0, M0) non-small cell lung cancer, adenocarcinoma involving the right upper lobe. The patient also has evidence for adenocarcinoma of the left upper lobe lung nodule as well as subcentimeter pulmonary nodules in the right lung concerning for stage IV lung cancer with multifocal disease bilaterally.  Biomarker Findings  Microsatellite status - MS-Equivocal ? Tumor Mutational Burden - Cannot Be Determined Genomic Findings For a complete list of the genes assayed, please refer to the Appendix. MET [id number]*2 AXL T343M TP53 I254V 7 Disease relevant genes with no reportable alterations: ALK, BRAF, EGFR, ERBB2, KRAS, RET, ROS1  PDL1 Expression 20 %.  PRIOR THERAPY: status post right upper lobectomy with lymph node sampling on August 26, 2020 under the care of Dr. Kipp Brood.  CURRENT THERAPY: Adjuvant systemic chemotherapy with cisplatin 75 mg/M2 and Alimta 550/M2 every 3 weeks. First dose October 22, 2020.  Status post 3 cycles.  INTERVAL HISTORY: Tonya [name] 75 y.o. female returns to the clinic today for follow-up visit accompanied by her husband.  The patient is feeling fine today with no concerning complaints except for tenderness and pain in her left arm from the last infusion.  She denied having any current chest pain, shortness of breath except with exertion with no cough or hemoptysis.  She denied having any fever or chills.  She has no nausea, vomiting, diarrhea or constipation.  She has no headache or visual changes.  She is here today for evaluation before starting cycle #4 of her treatment.  MEDICAL HISTORY: Past Medical History:  Diagnosis Date  . Adenoma of left adrenal gland   . Cancer ([place])    Lung  . Colon polyps    hyperplastic  . Complication of anesthesia    Very  emotional and cries after anesthesia  . GERD (gastroesophageal reflux disease)   . Kidney stone   . Liver lesion   . Microhematuria     ALLERGIES:  is allergic to other and penicillins.  MEDICATIONS:  Current Outpatient Medications  Medication Sig Dispense Refill  . acetaminophen (TYLENOL) 500 MG tablet Take 2 tablets (1,000 mg total) by mouth every 6 (six) hours as needed. (Patient taking differently: Take 1,000 mg by mouth every 6 (six) hours as needed for mild pain or moderate pain.) 30 tablet 0  . albuterol (VENTOLIN HFA) 108 (90 Base) MCG/ACT inhaler Inhale 1-2 puffs into the lungs every 4 (four) hours as needed for wheezing or shortness of breath.    Marland Kitchen alendronate (FOSAMAX) 70 MG tablet Take 70 mg by mouth every Saturday.    . ALPRAZolam (XANAX) 1 MG tablet Take 1 mg by mouth in the morning, at noon, and at bedtime. LF on 11-28-20 # 90 DS 30    . Calcium Carbonate-Vitamin D (CALCIUM + D PO) Take 1 tablet by mouth 2 (two) times daily.    . cyclobenzaprine (FLEXERIL) 5 MG tablet Take 5 mg by mouth 3 (three) times daily as needed for muscle spasms (sciatic nerve).     Marland Kitchen dexamethasone (DECADRON) 2 MG tablet Take 4 mg by mouth See admin instructions. Take 4 mg mouth twice daily the day before, day of and 4 days after  the chemotherapy every 3 weeks.    . folic acid (FOLVITE) 1 MG tablet Take 1 tablet (1 mg total) by mouth daily. (Patient  taking differently: Take 1 mg by mouth daily at 12 noon.) 30 tablet 4  . guaiFENesin (MUCINEX) 600 MG 12 hr tablet Take 2 tablets (1,200 mg total) by mouth 2 (two) times daily as needed. (Patient taking differently: Take 1,200 mg by mouth 2 (two) times daily as needed for to loosen phlegm.)    . ibuprofen (ADVIL) 800 MG tablet Take 1 tablet (800 mg total) by mouth every 8 (eight) hours as needed for moderate pain. 30 tablet 0  . LORazepam (ATIVAN) 0.5 MG tablet Take 1 tablet (0.5 mg total) by mouth daily as needed for anxiety. And nausea. 15 tablet 0  .  magnesium oxide (MAG-OX) 400 (241.3 Mg) MG tablet Take 1 tablet (400 mg total) by mouth 2 (two) times daily. 60 tablet 1  . naproxen (NAPROSYN) 375 MG tablet Take 1 tablet (375 mg total) by mouth 2 (two) times daily as needed for moderate pain. 8 tablet 0  . omeprazole (PRILOSEC) 40 MG capsule Take 40 mg by mouth 2 (two) times daily.    . ondansetron (ZOFRAN) 8 MG tablet Take 1 tablet (8 mg total) by mouth every 8 (eight) hours as needed for nausea or vomiting. Starting at least 3 days after chemotherapy 30 tablet 1  . oxyCODONE-acetaminophen (PERCOCET) 10-325 MG tablet Take 1 tablet by mouth every 8 (eight) hours as needed (sciatic nerve pain).    . pregabalin (LYRICA) 25 MG capsule Take 1 capsule (25 mg total) by mouth 2 (two) times daily. 60 capsule 1  . prochlorperazine (COMPAZINE) 10 MG tablet TAKE 1 TABLET(10 MG) BY MOUTH EVERY 6 HOURS AS NEEDED FOR NAUSEA OR VOMITING 30 tablet 0  . rosuvastatin (CRESTOR) 10 MG tablet Take 10 mg by mouth at bedtime.     No current facility-administered medications for this visit.    SURGICAL HISTORY:  Past Surgical History:  Procedure Laterality Date  . ABDOMINAL HYSTERECTOMY    . APPENDECTOMY    . BRONCHIAL BIOPSY  07/18/2020   Procedure: BRONCHIAL BIOPSIES;  Surgeon: [name], DO;  Location: [facility];  Service: Pulmonary;;  . BRONCHIAL BRUSHINGS  07/18/2020   Procedure: BRONCHIAL BRUSHINGS;  Surgeon: [name], DO;  Location: [facility];  Service: Pulmonary;;  . BRONCHIAL NEEDLE ASPIRATION BIOPSY  07/18/2020   Procedure: BRONCHIAL NEEDLE ASPIRATION BIOPSIES;  Surgeon: [name], DO;  Location: [facility];  Service: Pulmonary;;  . BRONCHIAL WASHINGS  07/18/2020   Procedure: BRONCHIAL WASHINGS;  Surgeon: [name], DO;  Location: [facility];  Service: Pulmonary;;  . FIDUCIAL MARKER PLACEMENT  07/18/2020   Procedure: FIDUCIAL MARKER PLACEMENT;  Surgeon: [name], DO;  Location: [facility];  Service:  Pulmonary;;  . INTERCOSTAL NERVE BLOCK Right 08/26/2020   Procedure: INTERCOSTAL NERVE BLOCK;  Surgeon: [name], MD;  Location: [facility];  Service: Thoracic;  Laterality: Right;  . IR THORACENTESIS ASP PLEURAL SPACE W/IMG GUIDE  10/10/2020  . NODE DISSECTION Right 08/26/2020   Procedure: NODE DISSECTION;  Surgeon: [name], MD;  Location: [facility];  Service: Thoracic;  Laterality: Right;  . THORACENTESIS Right 11/04/2020   Procedure: THORACENTESIS;  Surgeon: [name], DO;  Location[facility];  Service: Pulmonary;  Laterality: Right;  . THORACENTESIS Right 11/29/2020   Procedure: THORACENTESIS;  Surgeon: [name], DO;  Location: [facility];  Service: Pulmonary;  Laterality: Right;  Marland Kitchen VIDEO BRONCHOSCOPY WITH ENDOBRONCHIAL NAVIGATION N/A 07/18/2020   Procedure: VIDEO BRONCHOSCOPY WITH ENDOBRONCHIAL NAVIGATION;  Surgeon: [name],  DO;  Location: [facility];  Service: Pulmonary;  Laterality: N/A;  . VIDEO BRONCHOSCOPY WITH ENDOBRONCHIAL ULTRASOUND  07/18/2020   Procedure: VIDEO BRONCHOSCOPY WITH ENDOBRONCHIAL ULTRASOUND;  Surgeon: Josephine Igo, DO;  Location: [facility];  Service: Pulmonary;;    REVIEW OF SYSTEMS:  Constitutional: positive for fatigue Eyes: negative Ears, nose, mouth, throat, and face: negative Respiratory: positive for dyspnea on exertion Cardiovascular: negative Gastrointestinal: negative Genitourinary:negative Integument/breast: negative Hematologic/lymphatic: negative Musculoskeletal:negative Neurological: negative Behavioral/Psych: negative Endocrine: negative Allergic/Immunologic: negative   PHYSICAL EXAMINATION: General appearance: alert, cooperative, fatigued and no distress Head: Normocephalic, without obvious abnormality, atraumatic Neck: no adenopathy, no JVD, supple, symmetrical, trachea midline and thyroid not enlarged, symmetric, no tenderness/mass/nodules Lymph nodes: Cervical, supraclavicular, and  axillary nodes normal. Resp: clear to auscultation bilaterally Back: symmetric, no curvature. ROM normal. No CVA tenderness. Cardio: regular rate and rhythm, S1, S2 normal, no murmur, click, rub or gallop GI: soft, non-tender; bowel sounds normal; no masses,  no organomegaly Extremities: extremities normal, atraumatic, no cyanosis or edema Neurologic: Alert and oriented X 3, normal strength and tone. Normal symmetric reflexes. Normal coordination and gait  ECOG PERFORMANCE STATUS: 1 - Symptomatic but completely ambulatory  Blood pressure (!) 154/77, pulse 68, temperature 98 F (36.7 C), temperature source Tympanic, resp. rate 19, height 5\' 4"  (1.626 m), weight 144 lb 11.2 oz (65.6 kg), SpO2 100 %.  LABORATORY DATA: Lab Results  Component Value Date   WBC 1.5 (L) 01/08/2021   HGB 9.5 (L) 01/08/2021   HCT 28.3 (L) 01/08/2021   MCV 93.7 01/08/2021   PLT 289 01/08/2021      Chemistry      Component Value Date/Time   NA 143 12/31/2020 0859   K 3.8 12/31/2020 0859   CL 109 12/31/2020 0859   CO2 26 12/31/2020 0859   BUN 12 12/31/2020 0859   CREATININE 0.74 12/31/2020 0859      Component Value Date/Time   CALCIUM 8.7 (L) 12/31/2020 0859   ALKPHOS 63 12/31/2020 0859   AST 12 (L) 12/31/2020 0859   ALT 6 12/31/2020 0859   BILITOT 0.3 12/31/2020 0859       RADIOGRAPHIC STUDIES: DG Chest 2 View  Result Date: 12/24/2020 CLINICAL DATA:  Swelling of left hand and forearm EXAM: CHEST - 2 VIEW COMPARISON:  11/29/2020 FINDINGS: Increased right pleural effusion with adjacent atelectasis. Postsurgical changes are present on the right. Stable appearance of left lung. Stable cardiomediastinal contours. IMPRESSION: Increased right pleural effusion with adjacent atelectasis. Electronically Signed   By: 12/01/2020 M.D.   On: 12/24/2020 14:18   UE Venous Duplex ([facility] and [facility] ONLY)  Result Date: 12/24/2020 UPPER VENOUS STUDY  Indications: Swelling of hand and forearm post infusion Risk  Factors: Cancer Adenocarcinoma of lung stage 2 Transfusion on 4/13 using LT hand IV access, swelling began on 4/15. Comparison Study: No previous exams Performing Technologist: 5/15  Examination Guidelines: A complete evaluation includes B-mode imaging, spectral Doppler, color Doppler, and power Doppler as needed of all accessible portions of each vessel. Bilateral testing is considered an integral part of a complete examination. Limited examinations for reoccurring indications may be performed as noted.  Right Findings: +----------+------------+---------+-----------+----------+-------+ RIGHT     CompressiblePhasicitySpontaneousPropertiesSummary +----------+------------+---------+-----------+----------+-------+ Subclavian    Full       Yes       Yes                      +----------+------------+---------+-----------+----------+-------+  Left Findings: +----------+------------+---------+-----------+----------+-------+ LEFT  CompressiblePhasicitySpontaneousPropertiesSummary +----------+------------+---------+-----------+----------+-------+ IJV           Full       Yes       Yes                      +----------+------------+---------+-----------+----------+-------+ Subclavian    Full       Yes       Yes                      +----------+------------+---------+-----------+----------+-------+ Axillary      Full       Yes       Yes                      +----------+------------+---------+-----------+----------+-------+ Brachial      Full       Yes       Yes                      +----------+------------+---------+-----------+----------+-------+ Radial        Full                                          +----------+------------+---------+-----------+----------+-------+ Ulnar         Full                                          +----------+------------+---------+-----------+----------+-------+ Cephalic      Full                                           +----------+------------+---------+-----------+----------+-------+ Basilic       Full       Yes       Yes                      +----------+------------+---------+-----------+----------+-------+ 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  Summary:  Right: No evidence of thrombosis in the subclavian.  Left: No evidence of deep vein thrombosis in the upper extremity. 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  *See table(s) above for measurements and observations.  Diagnosing physician: Curt Jews MD Electronically signed by Curt Jews MD on 12/24/2020 at 4:58:13 PM.    Final     ASSESSMENT AND PLAN: This is a very pleasant 75 years old African-American female recently diagnosed with a stage IIb (T3, N0, M0) non-small cell lung cancer, adenocarcinoma involving the right upper lobe with 2 nodules. The patient also has evidence for adenocarcinoma of the left upper lobe and subcentimeter nodules in the right lung concerning for stage IV with multifocal disease bilaterally. She is status post right upper lobectomy with lymph node dissection on August 26, 2020 under the care of Dr. Kipp Brood. Her molecular studies by foundation 1 showed no actionable mutations. She has PD-L1 expression of 20%. She is currently undergoing adjuvant treatment with systemic chemotherapy with cisplatin 75 mg/M2 and Alimta 500 mg/M2 every 3 weeks status post 3 cycles.  The patient continues to tolerate the treatment well except for fatigue. CBC today showed leukocytopenia and neutropenia.  I will delay her treatment by  1 week until recovery of her white blood count.  In the interval I will arrange for the patient to receive Granix 300 mcg subcutaneously for the next 3 days. The patient will come back for follow-up visit in 4 weeks for evaluation with repeat CT scan of the chest for restaging of her disease. For the nausea I will add Ativan 0.5 mg p.o. daily as needed for the  nausea. She was advised to call immediately if she has any concerning symptoms in the interval. The patient voices understanding of current disease status and treatment options and is in agreement with the current care plan.  All questions were answered. The patient knows to call the clinic with any problems, questions or concerns. We can certainly see the patient much sooner if necessary.  Disclaimer: This note was dictated with voice recognition software. Similar sounding words can inadvertently be transcribed and may not be corrected upon review.

## 2021-01-09 ENCOUNTER — Inpatient Hospital Stay: Payer: Medicare HMO

## 2021-01-09 ENCOUNTER — Encounter: Payer: Self-pay | Admitting: Physician Assistant

## 2021-01-09 VITALS — BP 141/77 | HR 68 | Temp 98.7°F | Resp 16

## 2021-01-09 DIAGNOSIS — T451X5A Adverse effect of antineoplastic and immunosuppressive drugs, initial encounter: Secondary | ICD-10-CM

## 2021-01-09 DIAGNOSIS — D701 Agranulocytosis secondary to cancer chemotherapy: Secondary | ICD-10-CM

## 2021-01-09 DIAGNOSIS — Z5111 Encounter for antineoplastic chemotherapy: Secondary | ICD-10-CM | POA: Diagnosis not present

## 2021-01-09 MED ORDER — FILGRASTIM-AAFI 300 MCG/0.5ML IJ SOSY
300.0000 ug | PREFILLED_SYRINGE | Freq: Once | INTRAMUSCULAR | Status: AC
Start: 2021-01-09 — End: 2021-01-09
  Administered 2021-01-09: 300 ug via SUBCUTANEOUS
  Filled 2021-01-09: qty 0.5

## 2021-01-09 NOTE — Patient Instructions (Signed)

## 2021-01-09 NOTE — Progress Notes (Signed)
Received call from patient regarding financial assistance.  Discussed Radio broadcast assistant. She will call me with verbal income to determine qualifications.  She has my card with my contact information.

## 2021-01-10 ENCOUNTER — Telehealth: Payer: Self-pay | Admitting: Hematology and Oncology

## 2021-01-10 ENCOUNTER — Other Ambulatory Visit: Payer: Self-pay

## 2021-01-10 ENCOUNTER — Telehealth: Payer: Self-pay | Admitting: Internal Medicine

## 2021-01-10 ENCOUNTER — Telehealth: Payer: Self-pay

## 2021-01-10 ENCOUNTER — Telehealth: Payer: Self-pay | Admitting: Physician Assistant

## 2021-01-10 ENCOUNTER — Inpatient Hospital Stay: Payer: Medicare HMO

## 2021-01-10 VITALS — BP 164/80 | Temp 98.2°F | Resp 17

## 2021-01-10 DIAGNOSIS — D701 Agranulocytosis secondary to cancer chemotherapy: Secondary | ICD-10-CM

## 2021-01-10 DIAGNOSIS — Z5111 Encounter for antineoplastic chemotherapy: Secondary | ICD-10-CM | POA: Diagnosis not present

## 2021-01-10 DIAGNOSIS — T451X5A Adverse effect of antineoplastic and immunosuppressive drugs, initial encounter: Secondary | ICD-10-CM

## 2021-01-10 MED ORDER — FILGRASTIM-AAFI 300 MCG/0.5ML IJ SOSY
300.0000 ug | PREFILLED_SYRINGE | Freq: Once | INTRAMUSCULAR | Status: AC
Start: 2021-01-10 — End: 2021-01-10
  Administered 2021-01-10: 300 ug via SUBCUTANEOUS
  Filled 2021-01-10: qty 0.5

## 2021-01-10 MED ORDER — FILGRASTIM-SNDZ 300 MCG/0.5ML IJ SOSY
PREFILLED_SYRINGE | INTRAMUSCULAR | Status: AC
  Filled 2021-01-10: qty 0.5

## 2021-01-10 NOTE — Patient Instructions (Signed)

## 2021-01-10 NOTE — Telephone Encounter (Signed)
Scheduled appt per 5/6 sch msg. Called pt, no answer. Left msg with appt date and time.

## 2021-01-10 NOTE — Telephone Encounter (Signed)
I have left a message for the pt reminder her to schedule her CT scan before her next appt.

## 2021-01-10 NOTE — Telephone Encounter (Signed)
Scheduled per 05/04 los, patient has been called and voicemail was left.

## 2021-01-14 ENCOUNTER — Encounter: Payer: Self-pay | Admitting: Internal Medicine

## 2021-01-15 ENCOUNTER — Ambulatory Visit: Payer: Medicare HMO | Admitting: Pulmonary Disease

## 2021-01-15 ENCOUNTER — Encounter: Payer: Self-pay | Admitting: Pulmonary Disease

## 2021-01-15 ENCOUNTER — Other Ambulatory Visit: Payer: Self-pay

## 2021-01-15 VITALS — BP 124/58 | HR 85 | Temp 97.7°F | Ht 64.0 in | Wt 145.4 lb

## 2021-01-15 DIAGNOSIS — Z87891 Personal history of nicotine dependence: Secondary | ICD-10-CM

## 2021-01-15 DIAGNOSIS — J90 Pleural effusion, not elsewhere classified: Secondary | ICD-10-CM | POA: Diagnosis not present

## 2021-01-15 DIAGNOSIS — C3492 Malignant neoplasm of unspecified part of left bronchus or lung: Secondary | ICD-10-CM

## 2021-01-15 DIAGNOSIS — C3491 Malignant neoplasm of unspecified part of right bronchus or lung: Secondary | ICD-10-CM

## 2021-01-15 DIAGNOSIS — Z902 Acquired absence of lung [part of]: Secondary | ICD-10-CM

## 2021-01-15 DIAGNOSIS — J432 Centrilobular emphysema: Secondary | ICD-10-CM

## 2021-01-15 MED ORDER — ALBUTEROL SULFATE HFA 108 (90 BASE) MCG/ACT IN AERS: 2.0000 | INHALATION_SPRAY | Freq: Four times a day (QID) | RESPIRATORY_TRACT | 2 refills | Status: AC | PRN

## 2021-01-15 NOTE — Patient Instructions (Addendum)
Thank you for visiting Dr. Valeta Harms at [facility]. Today we recommend the following:  Continue follow up with oncology  Return in about 6 months (around 07/18/2021) for with APP or Dr. Valeta Harms.    Please do your part to reduce the spread of COVID-19.

## 2021-01-15 NOTE — Progress Notes (Signed)
Synopsis: Referred in Oct 2021 for lung nodule by Lin Landsman, MD  Subjective:   PATIENT ID: Tonya [name] GENDER: female DOB: 06/25/1946, MRN: [id number]  Chief Complaint  Patient presents with  . Follow-up    Pt c/o fatigue, general chest discomfort. Denies difficulty taking in deep breath.     This is a 75 year old female, former smoker, quit last week, smoked for 60+ years at approximately 1/3 to 1/2 pack/day.  She recently went to the emergency department for evaluation for chest pain.  She was referred to cardiology.  She had a coronary calcium CT scan that was completed at [facility] imaging.  Record available for review in epic care everywhere.  This was completed on 06/11/2020.  This revealed a 1.8 cm spiculated nodule in the right upper lobe concerning for primary bronchogenic carcinoma.  I am unable to review the images today but I can review the report.  I discussed this with the patient today in the office in detail.  As for her symptoms her chest pain has since resolved.  She also had an echocardiogram completed recently with grade 1 diastolic dysfunction.  She is following with Dr. Fraser Din from [facility] cardiology.  From a respiratory standpoint she does have some dyspnea on exertion.  She is not had any previous maintenance inhalers for COPD.  She does have evidence emphysema on her coronary CT scan as per her report.  No family history of lung cancer..  OV 07/11/2020: Patient here today for follow-up after recent nuclear medicine pet imaging.  As well as super D CT imaging.  Patient found to have a new left upper lobe lesion as well as a right upper lobe lesion.  The new left upper lobe lesion was found in this process of work-up.  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  We discussed this today in the office.  Patient is very anxious about all of this new information.  Denies fevers night sweats weight loss.  OV 08/22/2020: Patient here today for follow-up after  bronchoscopy and surgery consultation.  Surgery consultation reviewed and discussed with Dr. Kipp Brood.  Actually spoke with Dr. Kipp Brood this morning to discuss case.  Patient has plans for resection of right-sided malignancy.  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  MRI of the brain negative results reviewed today in the office.  Patient has plans for resection of the right side and likely SBRT to the left.  Patient is somewhat anxious for her planned procedure but is ready to get this process moving forward and over with.  OV 10/17/2020: 75 year old here for follow-up.  On 08/26/2020 patient underwent right upper lobe lobectomy.  Pathology with invasive multifocal adenocarcinoma.  There was involvement of the pleura.  Patient has subsequently seen medical oncology.  She also has a lower lobe lung cancer of the left side also adenocarcinoma.  Patient had postoperative chest x-ray which revealed a pleural effusion.  Patient was referred to interventional radiology.  Had thoracentesis on 10/10/2020.  Pathology shows atypical cells there was a few cells that stained with TTF-1.  No overt call on malignancy within the pleural fluid.  Reviewed path results today in the office with patient.  Also on exam had no breath sounds in the right side and ultrasound of the right chest shows reaccumulation of the right-sided pleural effusion.  Patient does have some shortness of breath.  She is anxious about all the things that have been changing around her.  Patient denies fever chills night  sweats weight loss.  OV 12/09/2020: Here today for follow-up after recent outpatient thoracentesis.  Patient has recurrent right-sided pleural effusion.  From respiratory standpoint she is feeling better however has had this effusion tapped several times since last office visit.  Today we discussed recurrence of effusion and how we would manage this.  She is anxious about  potentially having a repeat procedure, surgery or indwelling pleural catheter placement.  OV 01/15/2021: Here today for follow-up recurrent pleural effusion.  Was initially set up for a indwelling pleural catheter placement however on the day of the set up patient was still feeling okay no difficulty breathing ultrasound with very minimal fluid present.  Procedure was canceled.  Today, she has no respiratory complaints today.  She feels relatively at her baseline.  Recently had to be started on filgrastim injections due to low white blood cell count unable to complete chemotherapy.  She is scheduled to restart next week.   Oncology History  Adenocarcinoma of right lung, stage 2 ([place])  09/11/2020 Initial Diagnosis   Adenocarcinoma of right lung, stage 2 ([place])   09/11/2020 Cancer Staging   Staging form: Lung, AJCC 8th Edition - Clinical: Stage IIB (cT3, cN0, cM0) - Signed by Curt Bears, MD on 09/11/2020   10/29/2020 -  Chemotherapy    Patient is on Treatment Plan: LUNG NSCLC PEMETREXED (ALIMTA) + CISPLATIN Q21D X 4 CYCLES        Past Medical History:  Diagnosis Date  . Adenoma of left adrenal gland   . Cancer ([place])    Lung  . Colon polyps    hyperplastic  . Complication of anesthesia    Very emotional and cries after anesthesia  . GERD (gastroesophageal reflux disease)   . Kidney stone   . Liver lesion   . Microhematuria      Family History  Problem Relation Age of Onset  . Arrhythmia Mother        s/p Pacer   . Heart attack Father   . Heart disease Sister   . Colon cancer Neg Hx      Past Surgical History:  Procedure Laterality Date  . ABDOMINAL HYSTERECTOMY    . APPENDECTOMY    . BRONCHIAL BIOPSY  07/18/2020   Procedure: BRONCHIAL BIOPSIES;  Surgeon: [name], DO;  Location: [facility];  Service: Pulmonary;;  . BRONCHIAL BRUSHINGS  07/18/2020   Procedure: BRONCHIAL BRUSHINGS;  Surgeon: [name], DO;  Location: [facility];  Service: Pulmonary;;  .  BRONCHIAL NEEDLE ASPIRATION BIOPSY  07/18/2020   Procedure: BRONCHIAL NEEDLE ASPIRATION BIOPSIES;  Surgeon: [name], DO;  Location: [facility];  Service: Pulmonary;;  . BRONCHIAL WASHINGS  07/18/2020   Procedure: BRONCHIAL WASHINGS;  Surgeon: [name], DO;  Location: [facility];  Service: Pulmonary;;  . FIDUCIAL MARKER PLACEMENT  07/18/2020   Procedure: FIDUCIAL MARKER PLACEMENT;  Surgeon: [name], DO;  Location: [facility];  Service: Pulmonary;;  . INTERCOSTAL NERVE BLOCK Right 08/26/2020   Procedure: INTERCOSTAL NERVE BLOCK;  Surgeon: [name], MD;  Location: [facility];  Service: Thoracic;  Laterality: Right;  . IR THORACENTESIS ASP PLEURAL SPACE W/IMG GUIDE  10/10/2020  . NODE DISSECTION Right 08/26/2020   Procedure: NODE DISSECTION;  Surgeon: [name], MD;  Location: [facility];  Service: Thoracic;  Laterality: Right;  . THORACENTESIS Right 11/04/2020   Procedure: THORACENTESIS;  Surgeon: [name], DO;  Location: [facility];  Service: Pulmonary;  Laterality: Right;  . THORACENTESIS Right  11/29/2020   Procedure: THORACENTESIS;  Surgeon: [name], DO;  Location: [facility];  Service: Pulmonary;  Laterality: Right;  Marland Kitchen VIDEO BRONCHOSCOPY WITH ENDOBRONCHIAL NAVIGATION N/A 07/18/2020   Procedure: VIDEO BRONCHOSCOPY WITH ENDOBRONCHIAL NAVIGATION;  Surgeon: [name], DO;  Location: [facility];  Service: Pulmonary;  Laterality: N/A;  . VIDEO BRONCHOSCOPY WITH ENDOBRONCHIAL ULTRASOUND  07/18/2020   Procedure: VIDEO BRONCHOSCOPY WITH ENDOBRONCHIAL ULTRASOUND;  Surgeon: [name], DO;  Location: [facility];  Service: Pulmonary;;    Social History   Socioeconomic History  . Marital status: Married    Spouse name: Not on file  . Number of children: 3  . Years of education: Not on file  . Highest education level: Not on file  Occupational History  . Not on file  Tobacco Use  . Smoking status: Former Smoker     Packs/day: 0.30    Years: 60.00    Pack years: 18.00    Types: Cigarettes    Quit date: 06/11/2020    Years since quitting: 0.5  . Smokeless tobacco: Never Used  Vaping Use  . Vaping Use: Never used  Substance and Sexual Activity  . Alcohol use: Yes    Comment: 0.5 drink per month  . Drug use: No  . Sexual activity: Not Currently    Birth control/protection: Surgical    Comment: Hysterectomy  Other Topics Concern  . 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    Social Determinants of Health   Financial Resource Strain: Not on file  Food Insecurity: Not on file  Transportation Needs: Not on file  Physical Activity: Not on file  Stress: Not on file  Social Connections: Not on file  Intimate Partner Violence: Not on file     Allergies  Allergen Reactions  . Other Anaphylaxis    Spiders  . Penicillins Hives    REACTION: 20 years     Outpatient Medications Prior to Visit  Medication Sig Dispense Refill  . acetaminophen (TYLENOL) 500 MG tablet Take 2 tablets (1,000 mg total) by mouth every 6 (six) hours as needed. (Patient taking differently: Take 1,000 mg by mouth every 6 (six) hours as needed for mild pain or moderate pain.) 30 tablet 0  . albuterol (VENTOLIN HFA) 108 (90 Base) MCG/ACT inhaler Inhale 1-2 puffs into the lungs every 4 (four) hours as needed for wheezing or shortness of breath.    Marland Kitchen alendronate (FOSAMAX) 70 MG tablet Take 70 mg by mouth every Saturday.    . ALPRAZolam (XANAX) 1 MG tablet Take 1 mg by mouth in the morning, at noon, and at bedtime. LF on 11-28-20 # 90 DS 30    . Calcium Carbonate-Vitamin D (CALCIUM + D PO) Take 1 tablet by mouth 2 (two) times daily.    . cyclobenzaprine (FLEXERIL) 5 MG tablet Take 5 mg by mouth 3 (three) times daily as needed for muscle spasms (sciatic nerve).     Marland Kitchen dexamethasone (DECADRON) 2 MG tablet Take 4 mg by mouth See admin instructions. Take 4 mg mouth twice daily the day before, day of and 4 days  after  the chemotherapy every 3 weeks.    . folic acid (FOLVITE) 1 MG tablet Take 1 tablet (1 mg total) by mouth daily. (Patient taking differently: Take 1 mg by mouth daily at 12 noon.) 30 tablet 4  . guaiFENesin (MUCINEX) 600 MG 12 hr tablet Take 2 tablets (1,200 mg total) by mouth 2 (  two) times daily as needed. (Patient taking differently: Take 1,200 mg by mouth 2 (two) times daily as needed for to loosen phlegm.)    . ibuprofen (ADVIL) 800 MG tablet Take 1 tablet (800 mg total) by mouth every 8 (eight) hours as needed for moderate pain. 30 tablet 0  . LORazepam (ATIVAN) 0.5 MG tablet Take 1 tablet (0.5 mg total) by mouth daily as needed for anxiety. And nausea. 15 tablet 0  . magnesium oxide (MAG-OX) 400 (241.3 Mg) MG tablet Take 1 tablet (400 mg total) by mouth 2 (two) times daily. 60 tablet 1  . naproxen (NAPROSYN) 375 MG tablet Take 1 tablet (375 mg total) by mouth 2 (two) times daily as needed for moderate pain. 8 tablet 0  . omeprazole (PRILOSEC) 40 MG capsule Take 40 mg by mouth 2 (two) times daily.    . ondansetron (ZOFRAN) 8 MG tablet Take 1 tablet (8 mg total) by mouth every 8 (eight) hours as needed for nausea or vomiting. Starting at least 3 days after chemotherapy 30 tablet 1  . oxyCODONE-acetaminophen (PERCOCET) 10-325 MG tablet Take 1 tablet by mouth every 8 (eight) hours as needed (sciatic nerve pain).    . pregabalin (LYRICA) 25 MG capsule Take 1 capsule (25 mg total) by mouth 2 (two) times daily. 60 capsule 1  . prochlorperazine (COMPAZINE) 10 MG tablet TAKE 1 TABLET(10 MG) BY MOUTH EVERY 6 HOURS AS NEEDED FOR NAUSEA OR VOMITING 30 tablet 0  . rosuvastatin (CRESTOR) 10 MG tablet Take 10 mg by mouth at bedtime.     No facility-administered medications prior to visit.    Review of Systems  Constitutional: Negative for chills, fever, malaise/fatigue and weight loss.  HENT: Negative for hearing loss, sore throat and tinnitus.   Eyes: Negative for blurred vision and double vision.   Respiratory: Positive for shortness of breath. Negative for cough, hemoptysis, sputum production, wheezing and stridor.   Cardiovascular: Negative for chest pain, palpitations, orthopnea, leg swelling and PND.  Gastrointestinal: Negative for abdominal pain, constipation, diarrhea, heartburn, nausea and vomiting.  Genitourinary: Negative for dysuria, hematuria and urgency.  Musculoskeletal: Negative for joint pain and myalgias.  Skin: Negative for itching and rash.  Neurological: Negative for dizziness, tingling, weakness and headaches.  Endo/Heme/Allergies: Negative for environmental allergies. Does not bruise/bleed easily.  Psychiatric/Behavioral: Negative for depression. The patient is not nervous/anxious and does not have insomnia.   All other systems reviewed and are negative.    Objective:  Physical Exam Vitals reviewed.  Constitutional:      General: She is not in acute distress.    Appearance: She is well-developed.  HENT:     Head: Normocephalic and atraumatic.  Eyes:     General: No scleral icterus.    Conjunctiva/sclera: Conjunctivae normal.     Pupils: Pupils are equal, round, and reactive to light.  Neck:     Vascular: No JVD.     Trachea: No tracheal deviation.  Cardiovascular:     Rate and Rhythm: Normal rate and regular rhythm.     Heart sounds: Normal heart sounds. No murmur heard.   Pulmonary:     Effort: Pulmonary effort is normal. No tachypnea, accessory muscle usage or respiratory distress.     Breath sounds: No stridor. No wheezing, rhonchi or rales.  Abdominal:     General: Bowel sounds are normal. There is no distension.     Palpations: Abdomen is soft.     Tenderness: There is no abdominal tenderness.  Musculoskeletal:  General: No tenderness.     Cervical back: Neck supple.  Lymphadenopathy:     Cervical: No cervical adenopathy.  Skin:    General: Skin is warm and dry.     Capillary Refill: Capillary refill takes less than 2 seconds.      Findings: No rash.  Neurological:     Mental Status: She is alert and oriented to person, place, and time.  Psychiatric:        Behavior: Behavior normal.      Vitals:   01/15/21 0901  BP: (!) 124/58  Pulse: 85  Temp: 97.7 F (36.5 C)  TempSrc: Temporal  SpO2: 99%  Weight: 145 lb 6.4 oz (66 kg)  Height: 5\' 4"  (1.626 m)   99% on RA BMI Readings from Last 3 Encounters:  01/15/21 24.96 kg/m  01/08/21 24.84 kg/m  01/01/21 25.06 kg/m   Wt Readings from Last 3 Encounters:  01/15/21 145 lb 6.4 oz (66 kg)  01/08/21 144 lb 11.2 oz (65.6 kg)  01/01/21 146 lb (66.2 kg)     CBC    Component Value Date/Time   WBC 1.5 (L) 01/08/2021 0906   WBC 4.8 12/24/2020 1232   RBC 3.02 (L) 01/08/2021 0906   HGB 9.5 (L) 01/08/2021 0906   HCT 28.3 (L) 01/08/2021 0906   PLT 289 01/08/2021 0906   MCV 93.7 01/08/2021 0906   MCH 31.5 01/08/2021 0906   MCHC 33.6 01/08/2021 0906   RDW 13.4 01/08/2021 0906   LYMPHSABS 0.6 (L) 01/08/2021 0906   MONOABS 0.2 01/08/2021 0906   EOSABS 0.0 01/08/2021 0906   BASOSABS 0.0 01/08/2021 0906     Chest Imaging: 06/11/2020 coronary calcium CT scan images: Report reviewed in [place] A right upper lobe 1.8 cm lung nodule concerning for primary bronchogenic carcinoma. The patient's images have been independently reviewed by me.    Pulmonary Functions Testing Results: PFT Results Latest Ref Rng & Units 06/24/2020  FVC-Pre L 1.59  FVC-Predicted Pre % 69  FVC-Post L 1.89  FVC-Predicted Post % 82  Pre FEV1/FVC % % 67  Post FEV1/FCV % % 68  FEV1-Pre L 1.06  FEV1-Predicted Pre % 59  FEV1-Post L 1.28  DLCO uncorrected ml/min/mmHg 10.29  DLCO UNC% % 53  DLVA Predicted % 85  TLC L 4.78  TLC % Predicted % 94  RV % Predicted % 142    FeNO:   Pathology:   08/26/2020 RUL Lobectomy:  . LUNG, RIGHT UPPER LOBE, LOBECTOMY:  - Multifocal invasive adenocarcinoma, moderately differentiated, 1.4 cm  - Carcinoma involves the pleura  -  Margins uninvolved by adenocarcinoma  - See oncology table and comment below   10/10/2020:  Specimen Submitted: A. PLEURAL FLUID, RIGHT, THORACENTESIS:  FINAL MICROSCOPIC DIAGNOSIS:  - Atypical cells present  SPECIMEN ADEQUACY:  Satisfactory for evaluation  DIAGNOSTIC COMMENTS:  IHC for TTF-1 identifies rare cells morphologically indistinct from cell  block material. D2-40 is positive in mesothelial cells. CD163 is  positive in histiocytes. MOC31 is negative. Should fluid  re-accumulate, cytology may be helpful  Echocardiogram:   Heart Catheterization:     Assessment & Plan:     ICD-10-CM   1. Recurrent pleural effusion on right  J90   2. Adenocarcinoma of lung, stage IIb, right (HCC)  C34.91   3. Adenocarcinoma of left lung, stage 1 (HCC)  C34.92   4. Former smoker  Z87.891   5. Centrilobular emphysema ([place])  J43.2   6. S/P lobectomy of lung  Z90.2  Discussion:  This is a 75 year old female, stage IIb, N0, M0 adenocarcinoma of the right lung, stage I left lower lobe adenocarcinoma status post right upper lobe lobectomy and radiation to the left.  Radiation to the left has yet to happen.  She did complete cycles of chemotherapy already.  Recently had low white blood cell count started on filgrastim.  Post surgery complicated by recurrent right-sided pleural effusion with significant shortness of breath.  She had several thoracentesis however does appear to have slowed down.  We originally scheduled for an IPC but this was canceled due to minimal fluid reaccumulation.  Plan: I think we should discontinue observation for her recurrent effusion. If this accumulates in any significant fashion we can consider tapping again. With her low white blood cell count and risk of site infection I would try to avoid an IPC, indwelling pleural catheter, placement if at all possible. As for her COPD and emphysema continue management with Stiolto plus as needed albuterol. Refills given  for albuterol today.  She should follow-up with [place] in 6 months or as needed.    Current Outpatient Medications:  .  acetaminophen (TYLENOL) 500 MG tablet, Take 2 tablets (1,000 mg total) by mouth every 6 (six) hours as needed. (Patient taking differently: Take 1,000 mg by mouth every 6 (six) hours as needed for mild pain or moderate pain.), Disp: 30 tablet, Rfl: 0 .  albuterol (VENTOLIN HFA) 108 (90 Base) MCG/ACT inhaler, Inhale 1-2 puffs into the lungs every 4 (four) hours as needed for wheezing or shortness of breath., Disp: , Rfl:  .  alendronate (FOSAMAX) 70 MG tablet, Take 70 mg by mouth every Saturday., Disp: , Rfl:  .  ALPRAZolam (XANAX) 1 MG tablet, Take 1 mg by mouth in the morning, at noon, and at bedtime. LF on 11-28-20 # 90 DS 30, Disp: , Rfl:  .  Calcium Carbonate-Vitamin D (CALCIUM + D PO), Take 1 tablet by mouth 2 (two) times daily., Disp: , Rfl:  .  cyclobenzaprine (FLEXERIL) 5 MG tablet, Take 5 mg by mouth 3 (three) times daily as needed for muscle spasms (sciatic nerve). , Disp: , Rfl:  .  dexamethasone (DECADRON) 2 MG tablet, Take 4 mg by mouth See admin instructions. Take 4 mg mouth twice daily the day before, day of and 4 days after  the chemotherapy every 3 weeks., Disp: , Rfl:  .  folic acid (FOLVITE) 1 MG tablet, Take 1 tablet (1 mg total) by mouth daily. (Patient taking differently: Take 1 mg by mouth daily at 12 noon.), Disp: 30 tablet, Rfl: 4 .  guaiFENesin (MUCINEX) 600 MG 12 hr tablet, Take 2 tablets (1,200 mg total) by mouth 2 (two) times daily as needed. (Patient taking differently: Take 1,200 mg by mouth 2 (two) times daily as needed for to loosen phlegm.), Disp: , Rfl:  .  ibuprofen (ADVIL) 800 MG tablet, Take 1 tablet (800 mg total) by mouth every 8 (eight) hours as needed for moderate pain., Disp: 30 tablet, Rfl: 0 .  LORazepam (ATIVAN) 0.5 MG tablet, Take 1 tablet (0.5 mg total) by mouth daily as needed for anxiety. And nausea., Disp: 15 tablet, Rfl: 0 .   magnesium oxide (MAG-OX) 400 (241.3 Mg) MG tablet, Take 1 tablet (400 mg total) by mouth 2 (two) times daily., Disp: 60 tablet, Rfl: 1 .  naproxen (NAPROSYN) 375 MG tablet, Take 1 tablet (375 mg total) by mouth 2 (two) times daily as needed for moderate pain., Disp: 8 tablet,  Rfl: 0 .  omeprazole (PRILOSEC) 40 MG capsule, Take 40 mg by mouth 2 (two) times daily., Disp: , Rfl:  .  ondansetron (ZOFRAN) 8 MG tablet, Take 1 tablet (8 mg total) by mouth every 8 (eight) hours as needed for nausea or vomiting. Starting at least 3 days after chemotherapy, Disp: 30 tablet, Rfl: 1 .  oxyCODONE-acetaminophen (PERCOCET) 10-325 MG tablet, Take 1 tablet by mouth every 8 (eight) hours as needed (sciatic nerve pain)., Disp: , Rfl:  .  pregabalin (LYRICA) 25 MG capsule, Take 1 capsule (25 mg total) by mouth 2 (two) times daily., Disp: 60 capsule, Rfl: 1 .  prochlorperazine (COMPAZINE) 10 MG tablet, TAKE 1 TABLET(10 MG) BY MOUTH EVERY 6 HOURS AS NEEDED FOR NAUSEA OR VOMITING, Disp: 30 tablet, Rfl: 0 .  rosuvastatin (CRESTOR) 10 MG tablet, Take 10 mg by mouth at bedtime., Disp: , Rfl:     [name], DO  Pulmonary Critical Care 01/15/2021 9:28 AM

## 2021-01-17 ENCOUNTER — Other Ambulatory Visit: Payer: Self-pay

## 2021-01-17 ENCOUNTER — Inpatient Hospital Stay: Payer: Medicare HMO

## 2021-01-17 VITALS — BP 165/85 | HR 65 | Temp 98.2°F | Resp 17 | Wt 146.8 lb

## 2021-01-17 DIAGNOSIS — C3490 Malignant neoplasm of unspecified part of unspecified bronchus or lung: Secondary | ICD-10-CM

## 2021-01-17 DIAGNOSIS — C3491 Malignant neoplasm of unspecified part of right bronchus or lung: Secondary | ICD-10-CM

## 2021-01-17 DIAGNOSIS — Z5111 Encounter for antineoplastic chemotherapy: Secondary | ICD-10-CM | POA: Diagnosis not present

## 2021-01-17 LAB — CBC WITH DIFFERENTIAL (CANCER CENTER ONLY)
Abs Immature Granulocytes: 0.33 10*3/uL — ABNORMAL HIGH (ref 0.00–0.07)
Basophils Absolute: 0 10*3/uL (ref 0.0–0.1)
Basophils Relative: 0 %
Eosinophils Absolute: 0 10*3/uL (ref 0.0–0.5)
Eosinophils Relative: 0 %
HCT: 29.6 % — ABNORMAL LOW (ref 36.0–46.0)
Hemoglobin: 9.6 g/dL — ABNORMAL LOW (ref 12.0–15.0)
Immature Granulocytes: 6 %
Lymphocytes Relative: 12 %
Lymphs Abs: 0.6 10*3/uL — ABNORMAL LOW (ref 0.7–4.0)
MCH: 31.3 pg (ref 26.0–34.0)
MCHC: 32.4 g/dL (ref 30.0–36.0)
MCV: 96.4 fL (ref 80.0–100.0)
Monocytes Absolute: 0.4 10*3/uL (ref 0.1–1.0)
Monocytes Relative: 8 %
Neutro Abs: 3.8 10*3/uL (ref 1.7–7.7)
Neutrophils Relative %: 74 %
Platelet Count: 115 10*3/uL — ABNORMAL LOW (ref 150–400)
RBC: 3.07 MIL/uL — ABNORMAL LOW (ref 3.87–5.11)
RDW: 13.4 % (ref 11.5–15.5)
WBC Count: 5.1 10*3/uL (ref 4.0–10.5)
nRBC: 0 % (ref 0.0–0.2)

## 2021-01-17 LAB — CMP (CANCER CENTER ONLY)
ALT: <6 U/L (ref 0–44)
AST: 10 U/L — ABNORMAL LOW (ref 15–41)
Albumin: 3.5 g/dL (ref 3.5–5.0)
Alkaline Phosphatase: 82 U/L (ref 38–126)
Anion gap: 8 (ref 5–15)
BUN: 15 mg/dL (ref 8–23)
CO2: 24 mmol/L (ref 22–32)
Calcium: 9 mg/dL (ref 8.9–10.3)
Chloride: 108 mmol/L (ref 98–111)
Creatinine: 0.81 mg/dL (ref 0.44–1.00)
GFR, Estimated: >60 mL/min (ref >60)
Glucose, Bld: 129 mg/dL — ABNORMAL HIGH (ref 70–99)
Potassium: 4.4 mmol/L (ref 3.5–5.1)
Sodium: 140 mmol/L (ref 135–145)
Total Bilirubin: 0.3 mg/dL (ref 0.3–1.2)
Total Protein: 6.6 g/dL (ref 6.5–8.1)

## 2021-01-17 LAB — MAGNESIUM: Magnesium: 1.6 mg/dL — ABNORMAL LOW (ref 1.7–2.4)

## 2021-01-17 MED ORDER — MAGNESIUM SULFATE 2 GM/50ML IV SOLN
INTRAVENOUS | Status: AC
  Filled 2021-01-17: qty 50

## 2021-01-17 MED ORDER — SODIUM CHLORIDE 0.9 % IV SOLN
150.0000 mg | Freq: Once | INTRAVENOUS | Status: AC
  Administered 2021-01-17: 150 mg via INTRAVENOUS
  Filled 2021-01-17: qty 5
  Filled 2021-01-17: qty 150

## 2021-01-17 MED ORDER — SODIUM CHLORIDE 0.9 % IV SOLN
10.0000 mg | Freq: Once | INTRAVENOUS | Status: AC
  Administered 2021-01-17: 10 mg via INTRAVENOUS
  Filled 2021-01-17: qty 10
  Filled 2021-01-17: qty 1

## 2021-01-17 MED ORDER — MAGNESIUM SULFATE 2 GM/50ML IV SOLN
2.0000 g | Freq: Once | INTRAVENOUS | Status: AC
Start: 2021-01-17 — End: 2021-01-17
  Administered 2021-01-17: 2 g via INTRAVENOUS

## 2021-01-17 MED ORDER — SODIUM CHLORIDE 0.9 % IV SOLN
510.0000 mg/m2 | Freq: Once | INTRAVENOUS | Status: AC
  Administered 2021-01-17: 900 mg via INTRAVENOUS
  Filled 2021-01-17: qty 20

## 2021-01-17 MED ORDER — POTASSIUM CHLORIDE IN NACL 20-0.9 MEQ/L-% IV SOLN
Freq: Once | INTRAVENOUS | Status: AC
  Filled 2021-01-17: qty 1000

## 2021-01-17 MED ORDER — PALONOSETRON HCL INJECTION 0.25 MG/5ML
INTRAVENOUS | Status: AC
  Filled 2021-01-17: qty 5

## 2021-01-17 MED ORDER — PALONOSETRON HCL INJECTION 0.25 MG/5ML
0.2500 mg | Freq: Once | INTRAVENOUS | Status: AC
Start: 2021-01-17 — End: 2021-01-17
  Administered 2021-01-17: 0.25 mg via INTRAVENOUS

## 2021-01-17 MED ORDER — SODIUM CHLORIDE 0.9 % IV SOLN
75.0000 mg/m2 | Freq: Once | INTRAVENOUS | Status: AC
  Administered 2021-01-17: 127 mg via INTRAVENOUS
  Filled 2021-01-17: qty 127

## 2021-01-17 MED ORDER — SODIUM CHLORIDE 0.9 % IV SOLN
Freq: Once | INTRAVENOUS | Status: AC
  Filled 2021-01-17: qty 250

## 2021-01-17 NOTE — Patient Instructions (Signed)
[facility]  Discharge Instructions: Thank you for choosing [facility] to provide your oncology and hematology care.   If you have a lab appointment with the [facility], please go directly to the [facility] and check in at the registration area.   Wear comfortable clothing and clothing appropriate for easy access to any Portacath or PICC line.   We strive to give you quality time with your provider. You may need to reschedule your appointment if you arrive late (15 or more minutes).  Arriving late affects you and other patients whose appointments are after yours.  Also, if you miss three or more appointments without notifying the office, you may be dismissed from the clinic at the provider's discretion.      For prescription refill requests, have your pharmacy contact our office and allow 72 hours for refills to be completed.    Today you received the following chemotherapy and/or immunotherapy agents: Alimta, Cisplatin      To help prevent nausea and vomiting after your treatment, we encourage you to take your nausea medication as directed.  BELOW ARE SYMPTOMS THAT SHOULD BE REPORTED IMMEDIATELY: . *FEVER GREATER THAN 100.4 F (38 C) OR HIGHER . *CHILLS OR SWEATING . *NAUSEA AND VOMITING THAT IS NOT CONTROLLED WITH YOUR NAUSEA MEDICATION . *UNUSUAL SHORTNESS OF BREATH . *UNUSUAL BRUISING OR BLEEDING . *URINARY PROBLEMS (pain or burning when urinating, or frequent urination) . *BOWEL PROBLEMS (unusual diarrhea, constipation, pain near the anus) . TENDERNESS IN MOUTH AND THROAT WITH OR WITHOUT PRESENCE OF ULCERS (sore throat, sores in mouth, or a toothache) . UNUSUAL RASH, SWELLING OR PAIN  . UNUSUAL VAGINAL DISCHARGE OR ITCHING   Items with * indicate a potential emergency and should be followed up as soon as possible or go to the Emergency Department if any problems should occur.  Please show the CHEMOTHERAPY ALERT CARD or  IMMUNOTHERAPY ALERT CARD at check-in to the Emergency Department and triage nurse.  Should you have questions after your visit or need to cancel or reschedule your appointment, please contact [facility]  Dept: [phone]  and follow the prompts.  Office hours are 8:00 a.m. to 4:30 p.m. Monday - Friday. Please note that voicemails left after 4:00 p.m. may not be returned until the following business day.  We are closed weekends and major holidays. You have access to a nurse at all times for urgent questions. Please call the main number to the clinic Dept: [phone] and follow the prompts.   For any non-urgent questions, you may also contact your provider using MyChart. We now offer e-Visits for anyone 81 and older to request care online for non-urgent symptoms. For details visit [email or web address].   Also download the MyChart app! Go to the app store, search "MyChart", open the app, select [facility], and log in with your MyChart username and password.  Due to Covid, a mask is required upon entering the hospital/clinic. If you do not have a mask, one will be given to you upon arrival. For doctor visits, patients may have 1 support person aged 89 or older with them. For treatment visits, patients cannot have anyone with them due to current Covid guidelines and our immunocompromised population.

## 2021-01-31 ENCOUNTER — Other Ambulatory Visit: Payer: Self-pay | Admitting: Physician Assistant

## 2021-01-31 DIAGNOSIS — C3491 Malignant neoplasm of unspecified part of right bronchus or lung: Secondary | ICD-10-CM

## 2021-02-02 NOTE — Progress Notes (Signed)
[facility] OFFICE PROGRESS NOTE  [address], [place] [id number]  DIAGNOSIS: Stage IIB(T3, N0, M0) non-small cell lung cancer, adenocarcinoma involving the right upper lobe. The patient also has evidence for adenocarcinoma of the left upper lobe lung nodule as well as subcentimeter pulmonary nodules in the right lung concerning for stage IV lung cancer with multifocal disease bilaterally.  Biomarker Findings  Microsatellite status - MS-Equivocal ? Tumor Mutational Burden - Cannot Be Determined Genomic Findings For a complete list of the genes assayed, please refer to the Appendix. MET [id number]*2 AXL T343M TP53 I254V 7 Disease relevant genes with no reportable alterations: ALK, BRAF, EGFR, ERBB2, KRAS, RET, ROS1  PDL1 Expression 20 %.  PRIOR THERAPY: 1) status post right upper lobectomy with lymph node sampling on August 26, 2020 under the care of Dr. Kipp Brood. 2) Adjuvant systemic chemotherapy with cisplatin 75 mg/M2 and Alimta 550/M2 every 3 weeks. Last dose 01/17/21.  Status post 4 cycles  CURRENT THERAPY: Observation   INTERVAL HISTORY: Tonya [name] 75 y.o. female returns to the clinic today for a follow-up visit accompanied by her husband.  The patient received her last cycle of adjuvant chemotherapy on 01/17/2021.  Overall, the patient tolerated her adjuvant treatment fair  except for some nausea/vomiting as well as occasional neutropenia which required dose delays and Granix injections.  The patient is feeling fair today but she has some bruising and tenderness on the dorsal aspect of her hands from her IV's from her chemotherapy.  She had an upper extremity ultrasound performed which was negative for DVT.  She also has fatigue, weight loss, and taste alterations.  She lost approximately 8 pounds since her last appointment.  She denies any fever, chills, or night sweats.  She states that overall her breathing is "okay" but has  occasional shortness of breath with exertion that comes and goes.  She denies any chest pain, cough, or hemoptysis.  She continues to have intermittent nausea.  She has had 1 episode of vomiting which occurred yesterday.  She denies any diarrhea or constipation.  She had a constant throbbing headache which she localizes to the frontal region of her forehead.  She states that she had this headache for about 36 hours and took Tylenol and oxycodone.  Eventually, the headache subsided and denies any other headaches at this time.  The patient recently had a restaging CT scan performed.  She is here today for evaluation and to review her scan results.  MEDICAL HISTORY: Past Medical History:  Diagnosis Date  . Adenoma of left adrenal gland   . Cancer ([place])    Lung  . Colon polyps    hyperplastic  . Complication of anesthesia    Very emotional and cries after anesthesia  . GERD (gastroesophageal reflux disease)   . Kidney stone   . Liver lesion   . Microhematuria     ALLERGIES:  is allergic to other and penicillins.  MEDICATIONS:  Current Outpatient Medications  Medication Sig Dispense Refill  . acetaminophen (TYLENOL) 500 MG tablet Take 2 tablets (1,000 mg total) by mouth every 6 (six) hours as needed. (Patient taking differently: Take 1,000 mg by mouth every 6 (six) hours as needed for mild pain or moderate pain.) 30 tablet 0  . albuterol (VENTOLIN HFA) 108 (90 Base) MCG/ACT inhaler Inhale 1-2 puffs into the lungs every 4 (four) hours as needed for wheezing or shortness of breath.    Marland Kitchen albuterol (VENTOLIN HFA) 108 (  90 Base) MCG/ACT inhaler Inhale 2 puffs into the lungs every 6 (six) hours as needed for wheezing or shortness of breath. 8 g 2  . alendronate (FOSAMAX) 70 MG tablet Take 70 mg by mouth every Saturday.    . ALPRAZolam (XANAX) 1 MG tablet Take 1 mg by mouth in the morning, at noon, and at bedtime. LF on 11-28-20 # 90 DS 30    . Calcium Carbonate-Vitamin D (CALCIUM + D PO) Take 1  tablet by mouth 2 (two) times daily.    . cyclobenzaprine (FLEXERIL) 5 MG tablet Take 5 mg by mouth 3 (three) times daily as needed for muscle spasms (sciatic nerve).     Marland Kitchen dexamethasone (DECADRON) 2 MG tablet Take 4 mg by mouth See admin instructions. Take 4 mg mouth twice daily the day before, day of and 4 days after  the chemotherapy every 3 weeks.    . fluconazole (DIFLUCAN) 100 MG tablet Take 1 tablet (100 mg total) by mouth daily. 7 tablet 0  . folic acid (FOLVITE) 1 MG tablet Take 1 tablet (1 mg total) by mouth daily. (Patient taking differently: Take 1 mg by mouth daily at 12 noon.) 30 tablet 4  . guaiFENesin (MUCINEX) 600 MG 12 hr tablet Take 2 tablets (1,200 mg total) by mouth 2 (two) times daily as needed. (Patient taking differently: Take 1,200 mg by mouth 2 (two) times daily as needed for to loosen phlegm.)    . ibuprofen (ADVIL) 800 MG tablet Take 1 tablet (800 mg total) by mouth every 8 (eight) hours as needed for moderate pain. 30 tablet 0  . LORazepam (ATIVAN) 0.5 MG tablet Take 1 tablet (0.5 mg total) by mouth daily as needed for anxiety. And nausea. 15 tablet 0  . magnesium oxide (MAG-OX) 400 (241.3 Mg) MG tablet Take 1 tablet (400 mg total) by mouth 2 (two) times daily. 60 tablet 1  . naproxen (NAPROSYN) 375 MG tablet Take 1 tablet (375 mg total) by mouth 2 (two) times daily as needed for moderate pain. 8 tablet 0  . omeprazole (PRILOSEC) 40 MG capsule Take 40 mg by mouth 2 (two) times daily.    . ondansetron (ZOFRAN) 8 MG tablet Take 1 tablet (8 mg total) by mouth every 8 (eight) hours as needed for nausea or vomiting. Starting at least 3 days after chemotherapy 30 tablet 1  . oxyCODONE-acetaminophen (PERCOCET) 10-325 MG tablet Take 1 tablet by mouth every 8 (eight) hours as needed (sciatic nerve pain).    . pregabalin (LYRICA) 25 MG capsule Take 1 capsule (25 mg total) by mouth 2 (two) times daily. 60 capsule 1  . prochlorperazine (COMPAZINE) 10 MG tablet TAKE 1 TABLET(10 MG) BY  MOUTH EVERY 6 HOURS AS NEEDED FOR NAUSEA OR VOMITING 30 tablet 0  . rosuvastatin (CRESTOR) 10 MG tablet Take 10 mg by mouth at bedtime.     No current facility-administered medications for this visit.    SURGICAL HISTORY:  Past Surgical History:  Procedure Laterality Date  . ABDOMINAL HYSTERECTOMY    . APPENDECTOMY    . BRONCHIAL BIOPSY  07/18/2020   Procedure: BRONCHIAL BIOPSIES;  Surgeon: Josephine Igo, DO;  Location: [facility];  Service: Pulmonary;;  . BRONCHIAL BRUSHINGS  07/18/2020   Procedure: BRONCHIAL BRUSHINGS;  Surgeon: Josephine Igo, DO;  Location: [facility];  Service: Pulmonary;;  . BRONCHIAL NEEDLE ASPIRATION BIOPSY  07/18/2020   Procedure: BRONCHIAL NEEDLE ASPIRATION BIOPSIES;  Surgeon: Josephine Igo, DO;  Location: [facility];  Service: Pulmonary;;  . BRONCHIAL WASHINGS  07/18/2020   Procedure: BRONCHIAL WASHINGS;  Surgeon: [name], DO;  Location: [facility];  Service: Pulmonary;;  . FIDUCIAL MARKER PLACEMENT  07/18/2020   Procedure: FIDUCIAL MARKER PLACEMENT;  Surgeon: [name], DO;  Location: [facility];  Service: Pulmonary;;  . INTERCOSTAL NERVE BLOCK Right 08/26/2020   Procedure: INTERCOSTAL NERVE BLOCK;  Surgeon: [name], MD;  Location: [facility];  Service: Thoracic;  Laterality: Right;  . IR THORACENTESIS ASP PLEURAL SPACE W/IMG GUIDE  10/10/2020  . NODE DISSECTION Right 08/26/2020   Procedure: NODE DISSECTION;  Surgeon: [name], MD;  Location: [facility];  Service: Thoracic;  Laterality: Right;  . THORACENTESIS Right 11/04/2020   Procedure: THORACENTESIS;  Surgeon: [name], DO;  Location: [facility];  Service: Pulmonary;  Laterality: Right;  . THORACENTESIS Right 11/29/2020   Procedure: THORACENTESIS;  Surgeon: [name], DO;  Location: [facility];  Service: Pulmonary;  Laterality: Right;  Marland Kitchen VIDEO BRONCHOSCOPY WITH ENDOBRONCHIAL NAVIGATION N/A 07/18/2020   Procedure: VIDEO BRONCHOSCOPY WITH  ENDOBRONCHIAL NAVIGATION;  Surgeon: [name], DO;  Location: [facility];  Service: Pulmonary;  Laterality: N/A;  . VIDEO BRONCHOSCOPY WITH ENDOBRONCHIAL ULTRASOUND  07/18/2020   Procedure: VIDEO BRONCHOSCOPY WITH ENDOBRONCHIAL ULTRASOUND;  Surgeon: [name], DO;  Location: [facility];  Service: Pulmonary;;    REVIEW OF SYSTEMS:   Review of Systems  Constitutional: Positive for fatigue, appetite change, weight loss.  Negative for chills,  and fever.  HENT:  Negative for mouth sores, nosebleeds, sore throat and trouble swallowing.   Eyes: Negative for eye problems and icterus.  Respiratory: Positive for intermittent shortness of breath with exertion.  Negative for cough, hemoptysis, and wheezing.   Cardiovascular: Negative for chest pain and leg swelling.  Gastrointestinal: Positive for intermittent nausea and one episode of vomiting. Negative for abdominal pain, constipation, and diarrhea. Genitourinary: Negative for bladder incontinence, difficulty urinating, dysuria, frequency and hematuria.   Musculoskeletal: Negative for back pain, gait problem, neck pain and neck stiffness.  Skin: Negative for itching and rash.  Neurological: Negative for dizziness, extremity weakness, gait problem, headaches (resolved), light-headedness and seizures.  Hematological: Negative for adenopathy. Does not bruise/bleed easily.  Psychiatric/Behavioral: Negative for confusion, depression and sleep disturbance. The patient is not nervous/anxious.     PHYSICAL EXAMINATION:  Blood pressure (!) 159/76, pulse 64, temperature (!) 97.3 F (36.3 C), temperature source Tympanic, resp. rate 18, height $RemoveBe'5\' 4"'GSuZFTXlM$  (1.626 m), weight 137 lb 8 oz (62.4 kg), SpO2 99 %.  ECOG PERFORMANCE STATUS: 1 - Symptomatic but completely ambulatory  Physical Exam  Constitutional: Oriented to person, place, and time and well-developed, well-nourished, and in no distress.  HENT:  Head: Normocephalic and atraumatic.   Mouth/Throat: Oropharynx is clear and moist. No oropharyngeal exudate.  Eyes: Conjunctivae are normal. Right eye exhibits no discharge. Left eye exhibits no discharge. No scleral icterus.  Neck: Normal range of motion. Neck supple.  Cardiovascular: Normal rate, regular rhythm, normal heart sounds and intact distal pulses.   Pulmonary/Chest: Effort normal and breath sounds normal. No respiratory distress. No wheezes. No rales.  Abdominal: Soft. Bowel sounds are normal. Exhibits no distension and no mass. There is no tenderness.  Musculoskeletal: Normal range of motion. Exhibits no edema.  Lymphadenopathy:    No cervical adenopathy.  Neurological: Alert and oriented to person, place, and time. Exhibits normal muscle tone. Gait normal. Coordination normal.  Skin: Bruising and tenderness over dorsal aspect of hands bilaterally  left worse. than right. skin is warm and dry. No rash noted. Not diaphoretic. No erythema. No pallor.  Psychiatric: Mood, memory and judgment normal.  Vitals reviewed.  LABORATORY DATA: Lab Results  Component Value Date   WBC 2.6 (L) 02/04/2021   HGB 8.4 (L) 02/04/2021   HCT 26.1 (L) 02/04/2021   MCV 98.9 02/04/2021   PLT 182 02/04/2021      Chemistry      Component Value Date/Time   NA 143 02/04/2021 1345   K 4.2 02/04/2021 1345   CL 109 02/04/2021 1345   CO2 25 02/04/2021 1345   BUN 16 02/04/2021 1345   CREATININE 0.88 02/04/2021 1345      Component Value Date/Time   CALCIUM 8.7 (L) 02/04/2021 1345   ALKPHOS 65 02/04/2021 1345   AST 11 (L) 02/04/2021 1345   ALT 6 02/04/2021 1345   BILITOT 0.3 02/04/2021 1345       RADIOGRAPHIC STUDIES:  CT Chest W Contrast  Result Date: 02/05/2021 CLINICAL DATA:  Non-small-cell lung cancer. EXAM: CT CHEST WITH CONTRAST TECHNIQUE: Multidetector CT imaging of the chest was performed during intravenous contrast administration. CONTRAST:  54mL OMNIPAQUE IOHEXOL 300 MG/ML  SOLN COMPARISON:  07/03/2020 FINDINGS:  Cardiovascular: The heart size is normal. No substantial pericardial effusion. Atherosclerotic calcification is noted in the wall of the thoracic aorta. Mediastinum/Nodes: No mediastinal lymphadenopathy. Multinodular thyroid parenchyma again noted. There is no hilar lymphadenopathy. The esophagus has normal imaging features. There is no axillary lymphadenopathy. Lungs/Pleura: Interval wedge resection of the right upper lobe lesion noted previously. There is volume loss/scarring along the staple line. Subpleural atelectasis/scarring noted right lower lobe with new small right pleural effusion. Left apical scarring is stable. Fiducial markers noted left upper lobe. 12 x 11 mm spiculated opacity seen in the left apex previously has decreased in the interval measuring 8 x 6 mm today. Tiny nodular opacity in the left lower lobe identified on the previous study has resolved in the interval. No new suspicious pulmonary nodule or mass. Upper Abdomen: Tiny subcapsular focus of hyperenhancement identified in the posterior right liver, likely a transient hepatic attenuation difference. Similar tiny focus of hyper enhancement identified in the subcapsular hepatic dome. Left renal cyst incompletely visualized. Intra and extrahepatic biliary duct dilatation again noted. Musculoskeletal: No worrisome lytic or sclerotic osseous abnormality. IMPRESSION: 1. Interval wedge resection of the right upper lobe lesion noted previously. No findings to suggest recurrent or metastatic disease in the chest. 2. Interval decrease in size of the spiculated left apical nodule. 3. Interval resolution of the left lower lobe pulmonary nodule. 4. New small right pleural effusion with right lower lobe atelectasis/scarring. 5. Tiny foci of hyperenhancement in the subcapsular liver likely benign but close attention on follow-up recommended to exclude metastatic disease. 6. Intra and extrahepatic biliary duct dilatation. Correlation with liver function  test may prove helpful. 7. Aortic Atherosclerosis (ICD10-I70.0). Electronically Signed   By: Misty Stanley M.D.   On: 02/05/2021 08:23     ASSESSMENT/PLAN:  This is a very pleasant 75 year old African-American female diagnosed with stage IIb (T3, N0, M0) non-small cell lung cancer, adenocarcinoma.  This involves the right upper lobe with 2 nodules.  The patient also has evidence of adenocarcinoma in the left upper lobe and subcentimeter nodules in the right lung concerning for stage IV with multifocal disease bilaterally.  She was diagnosed in December 2021.  Her PD-L1 expression is 20%.  Her molecular studies by foundation 1 is negative for any actionable mutations.  The patient previously underwent a right upper lobectomy lymph node dissection on August 26, 2020 under the care of Dr. Kipp Brood.   The patient completed 4 cycles of adjuvan chemotherapy with cisplatin 75 mg per metered squared and Alimta 500 mg per metered squared IV every 3 weeks.  Last dose was on 01/17/2021.  The patient recently had a restaging CT scan performed.  Dr. Julien Nordmann personally and independently reviewed the scan and discussed the results with the patient.  The scan showed no evidence of disease progression  Dr. Julien Nordmann gave the patient the option of continuing on observation with close follow-up restaging CT scans every 3 months versus starting maintenance immunotherapy with Tecentriq 1200 mg IV every 3 weeks up till 1 year unless evidence of disease progression or unacceptable toxicity.  The patient would like time to think about her options. I discussed with her the adverse effect of the immunotherapy including but not limited to immunotherapy mediated skin rash, diarrhea, inflammation of the lung, kidney, liver, thyroid or other endocrine dysfunction   In the meantime, I will arrange for the patient's next follow-up visit and restaging CT scan of the chest in 3 months.  She was advised to call us sooner if she would  like [place] to make arrangements to start maintenance immunotherapy with Tecentriq.  Regarding the bruising on her her hands, this is likely from her chemotherapy treatment.  Discussed that this will slowly improve.  In the meantime, she can have symptomatic relief with taking Tylenol or using heating pads if needed.  The patient lost 8 pounds since her last appointment.  She does have some evidence of thrush on her exam.  I sent a prescription for 100 mg tablets of Diflucan to take once daily for 7 to 10 days.  She is also advised to use salt water rinses.  She is also advised to increase her calorie and protein intake.  Her headache subsided at this time.  We will continue to monitor.  The patient's labs show some anemia and neutropenia which is secondary to her chemotherapy.  Discussed that this will improve in time now that she is no longer on chemotherapy.  However, I advised neutropenic precautions for the patient.  The patient was advised to call immediately if she has any concerning symptoms in the interval. The patient voices understanding of current disease status and treatment options and is in agreement with the current care plan. All questions were answered. The patient knows to call the clinic with any problems, questions or concerns. We can certainly see the patient much sooner if necessary     Orders Placed This Encounter  Procedures  . CT Chest W Contrast    Standing Status:   Future    Standing Expiration Date:   02/06/2022    Order Specific Question:   If indicated for the ordered procedure, I authorize the administration of contrast media per Radiology protocol    Answer:   Yes    Order Specific Question:   Preferred imaging location?    Answer:   [facility]  . CBC with Differential ([place] Only)    Standing Status:   Future    Standing Expiration Date:   02/06/2022  . CMP ([facility] only)    Standing Status:   Future    Standing Expiration Date:    02/06/2022      Tobe Sos Jayvion Stefanski, PA-C 02/06/21  ADDENDUM: Hematology/Oncology Attending: I had a face-to-face encounter with the patient today.  I reviewed her records, lab and scan and recommended her care plan.  This is a very pleasant 75 years old African-American female diagnosed with stage IIb (T3, N0, M0) non-small cell lung cancer, adenocarcinoma with PD-L1 expression of 20% and no actionable mutations.  The patient is status post right upper lobectomy with lymph node dissection on August 26, 2020 under the care of Dr. Kipp Brood.  She also completed 4 cycles of adjuvant systemic chemotherapy with cisplatin and Alimta last dose was given on Jan 17, 2021. The patient is here today for evaluation with repeat CT scan of the chest for restaging of her disease. Her scan showed no concerning findings for disease recurrence or metastasis. I had a lengthy discussion with the patient and her husband today about her current condition and treatment options.  I discussed with the patient the option of continuous observation and monitoring with repeat CT scan of the chest in 3 months versus proceeding with the second phase of adjuvant treatment with immunotherapy with Tecentriq 1200 Mg IV every 3 weeks for a total of 1 year based on the recent results and benefit for recurrence free survival with this drug for patient with PD-L1 expression over 1%. I discussed with the patient the mechanism of action of Tecentriq as well as the possible adverse effects including but not limited to immunotherapy mediated skin rash, diarrhea, inflammation of the lung, kidney, liver, thyroid or other endocrine dysfunction. The patient would like to have some time to think about her options and she will let us know if she decides to proceed with the adjuvant immunotherapy. She was advised to call immediately if she has any other concerning symptoms in the interval.  The total time spent in the appointment was 35  minutes. Disclaimer: This note was dictated with voice recognition software. Similar sounding words can inadvertently be transcribed and may be missed upon review. Eilleen Kempf, MD 02/06/21

## 2021-02-04 ENCOUNTER — Encounter (HOSPITAL_COMMUNITY): Payer: Self-pay

## 2021-02-04 ENCOUNTER — Inpatient Hospital Stay: Payer: Medicare HMO

## 2021-02-04 ENCOUNTER — Ambulatory Visit (HOSPITAL_COMMUNITY)
Admission: RE | Admit: 2021-02-04 | Discharge: 2021-02-04 | Disposition: A | Discharge status: Home / Self Care | Payer: Medicare HMO | Source: Ambulatory Visit | Attending: Internal Medicine | Admitting: Internal Medicine

## 2021-02-04 ENCOUNTER — Other Ambulatory Visit: Payer: Self-pay

## 2021-02-04 DIAGNOSIS — C3490 Malignant neoplasm of unspecified part of unspecified bronchus or lung: Secondary | ICD-10-CM | POA: Diagnosis not present

## 2021-02-04 DIAGNOSIS — Z5111 Encounter for antineoplastic chemotherapy: Secondary | ICD-10-CM | POA: Diagnosis not present

## 2021-02-04 DIAGNOSIS — C3491 Malignant neoplasm of unspecified part of right bronchus or lung: Secondary | ICD-10-CM

## 2021-02-04 LAB — CBC WITH DIFFERENTIAL (CANCER CENTER ONLY)
Abs Immature Granulocytes: 0 10*3/uL (ref 0.00–0.07)
Basophils Absolute: 0 10*3/uL (ref 0.0–0.1)
Basophils Relative: 0 %
Eosinophils Absolute: 0.1 10*3/uL (ref 0.0–0.5)
Eosinophils Relative: 4 %
HCT: 26.1 % — ABNORMAL LOW (ref 36.0–46.0)
Hemoglobin: 8.4 g/dL — ABNORMAL LOW (ref 12.0–15.0)
Immature Granulocytes: 0 %
Lymphocytes Relative: 49 %
Lymphs Abs: 1.3 10*3/uL (ref 0.7–4.0)
MCH: 31.8 pg (ref 26.0–34.0)
MCHC: 32.2 g/dL (ref 30.0–36.0)
MCV: 98.9 fL (ref 80.0–100.0)
Monocytes Absolute: 0.4 10*3/uL (ref 0.1–1.0)
Monocytes Relative: 15 %
Neutro Abs: 0.8 10*3/uL — ABNORMAL LOW (ref 1.7–7.7)
Neutrophils Relative %: 32 %
Platelet Count: 182 10*3/uL (ref 150–400)
RBC: 2.64 MIL/uL — ABNORMAL LOW (ref 3.87–5.11)
RDW: 14.3 % (ref 11.5–15.5)
WBC Count: 2.6 10*3/uL — ABNORMAL LOW (ref 4.0–10.5)
nRBC: 0 % (ref 0.0–0.2)

## 2021-02-04 LAB — CMP (CANCER CENTER ONLY)
ALT: 6 U/L (ref 0–44)
AST: 11 U/L — ABNORMAL LOW (ref 15–41)
Albumin: 3.2 g/dL — ABNORMAL LOW (ref 3.5–5.0)
Alkaline Phosphatase: 65 U/L (ref 38–126)
Anion gap: 9 (ref 5–15)
BUN: 16 mg/dL (ref 8–23)
CO2: 25 mmol/L (ref 22–32)
Calcium: 8.7 mg/dL — ABNORMAL LOW (ref 8.9–10.3)
Chloride: 109 mmol/L (ref 98–111)
Creatinine: 0.88 mg/dL (ref 0.44–1.00)
GFR, Estimated: >60 mL/min (ref >60)
Glucose, Bld: 94 mg/dL (ref 70–99)
Potassium: 4.2 mmol/L (ref 3.5–5.1)
Sodium: 143 mmol/L (ref 135–145)
Total Bilirubin: 0.3 mg/dL (ref 0.3–1.2)
Total Protein: 6.1 g/dL — ABNORMAL LOW (ref 6.5–8.1)

## 2021-02-04 MED ORDER — SODIUM CHLORIDE (PF) 0.9 % IJ SOLN
INTRAMUSCULAR | Status: AC
  Filled 2021-02-04: qty 50

## 2021-02-04 MED ORDER — IOHEXOL 300 MG/ML  SOLN
75.0000 mL | Freq: Once | INTRAMUSCULAR | Status: AC | PRN
  Administered 2021-02-04: 75 mL via INTRAVENOUS

## 2021-02-06 ENCOUNTER — Inpatient Hospital Stay: Payer: Medicare HMO | Attending: Internal Medicine | Admitting: Physician Assistant

## 2021-02-06 ENCOUNTER — Encounter: Payer: Self-pay | Admitting: Physician Assistant

## 2021-02-06 ENCOUNTER — Other Ambulatory Visit: Payer: Self-pay

## 2021-02-06 VITALS — BP 159/76 | HR 64 | Temp 97.3°F | Resp 18 | Ht 64.0 in | Wt 137.5 lb

## 2021-02-06 DIAGNOSIS — Z85118 Personal history of other malignant neoplasm of bronchus and lung: Secondary | ICD-10-CM | POA: Diagnosis not present

## 2021-02-06 DIAGNOSIS — B370 Candidal stomatitis: Secondary | ICD-10-CM

## 2021-02-06 DIAGNOSIS — C3491 Malignant neoplasm of unspecified part of right bronchus or lung: Secondary | ICD-10-CM

## 2021-02-06 DIAGNOSIS — D649 Anemia, unspecified: Secondary | ICD-10-CM | POA: Diagnosis not present

## 2021-02-06 MED ORDER — FLUCONAZOLE 100 MG PO TABS: 100.0000 mg | ORAL_TABLET | Freq: Every day | ORAL | 0 refills | Status: DC

## 2021-02-06 NOTE — Addendum Note (Signed)
Addended by: Claretha Cooper on: 02/06/2021 03:14 PM   Modules accepted: Orders

## 2021-02-06 NOTE — Patient Instructions (Addendum)
Thrombophlebitis Thrombophlebitis is a condition in which a blood clot forms in a vein. This can happen in your arms or legs, or in the area between your neck and groin (torso). When this condition happens in a vein that is close to the surface of the body (superficial thrombophlebitis), it is usually not serious.However, when the condition happens in a vein that is deep inside the body (deep vein thrombosis, DVT), it can cause serious problems. What are the causes? This condition may be caused by:  Damage to a vein.  Inflammation of the veins.  A condition that causes blood to clot more easily.  Reduced blood flow through the veins. What increases the risk? The following factors may make you more likely to develop this condition:  Having a condition that makes blood thicker or more likely to clot.  Having an infection.  Having major surgery.  Experiencing a traumatic injury or a broken bone.  Having a catheter in a vein (central line).  Having a condition in which valves in the veins do not work properly, causing blood to collect (pool) in the veins (chronic venous insufficiency).  An inactive (sedentary) lifestyle.  Pregnancy or having recently given birth.  Cancer.  Older age, especially being 33 or older.  Obesity.  Smoking.  Taking medicines that contain estrogen, such as birth control pills.  Having varicose veins.  Using drugs that are injected into the veins (intravenous, IV). What are the signs or symptoms? The main symptoms of this condition are:  Swelling and pain in an arm or leg. If the affected vein is in the leg, you may feel pain while standing or walking.  Warmth or redness in an arm or leg. Other symptoms include:  Low-grade fever.  Muscle aches.  A bulging vein (venous distension). In some cases, there are no symptoms. How is this diagnosed? This condition may be diagnosed based on:  Your symptoms and medical history.  A physical  exam.  Tests, such as: ? Blood tests. ? A test that uses sound waves to make images (ultrasound). How is this treated? Treatment depends on how severe the condition is and which area of the body is affected. Treatment may include:  Applying a warm compress or heating pad to affected areas.  Wearing compression stockings to help prevent blood clots and reduce swelling in your legs.  Raising (elevating) the affected arm or leg above the level of your heart.  Medicines, such as: ? Anti-inflammatory medicines, such as ibuprofen. ? Blood thinners (anticoagulants), such as heparin. ? Antibiotic medicine, if you have an infection.  Removing an IV that may be causing the problem. In rare cases, surgery may be needed to:  Remove a damaged section of a vein.  Place a filter in a large vein to catch blood clots before they reach the lungs. Follow these instructions at home: Medicines  Take over-the-counter and prescription medicines only as told by your health care provider.  If you were prescribed an antibiotic, take it as told by your health care provider. Do not stop using the antibiotic even if you feel better. Managing pain, stiffness, and swelling  If directed, put heat on the affected area as often as told by your health care provider. Use the heat source that your health care provider recommends, such as a moist heat pack or a heating pad. ? Place a towel between your skin and the heat source. ? Leave the heat on for 20-30 minutes. ? Remove the heat if  your skin turns bright red. This is especially important if you are not able to feel pain, heat, or cold. You may have a greater risk of getting burned.  Elevate the affected area above the level of your heart while you are sitting or lying down.   Activity  Return to your normal activities as told by your health care provider. Ask your health care provider what activities are safe for you.  Avoid sitting or lying down for long  periods. If possible, stand up and walk around regularly. If you are taking blood thinners:  Take your medicine exactly as told, at the same time every day.  Avoid activities that could cause injury or bruising, and follow instructions about how to prevent falls.  Wear a medical alert bracelet or carry a card that lists what medicines you take. General instructions  Drink enough fluid to keep your urine pale yellow.  Wear compression stockings as told by your health care provider.  Do not use any products that contain nicotine or tobacco, such as cigarettes and e-cigarettes. If you need help quitting, ask your health care provider.  Keep all follow-up visits as told by your health care provider. This is important. Contact a health care provider if:  You miss a dose of your blood thinner, if applicable.  Your symptoms do not improve.  You have unusual bruising.  You have nausea, vomiting, or diarrhea that lasts for more than one day. Get help right away if:  You have any of these problems: ? New or worse pain, swelling, or redness in an arm or leg. ? Numbness or tingling in an arm or leg. ? Shortness of breath. ? Chest pain. ? Severe pain in your abdomen. ? Fast breathing. ? A fast or irregular heartbeat. ? Blood in your vomit, stool, or urine. ? A severe headache or confusion. ? A cut that does not stop bleeding.  You feel light-headed or dizzy.  You cough up blood.  You have a serious fall or accident, or you hit your head. These symptoms may represent a serious problem that is an emergency. Do not wait to see if the symptoms will go away. Get medical help right away. Call your local emergency services (911 in the [place] Do not drive yourself to the hospital. Summary  Thrombophlebitis is a condition in which a blood clot forms in a vein. This can happen in a vein close to the surface of the body or a vein deep inside the body.  This condition can cause serious  problems when it happens in a vein deep inside the body (deep vein thrombosis, DVT).  The main symptom of this condition is swelling and pain around the affected vein.  Treatment may include warm compresses, anti-inflammatory medicines, or blood thinners. This information is not intended to replace advice given to you by your health care provider. Make sure you discuss any questions you have with your health care provider. Document Revised: 01/15/2020 Document Reviewed: 01/15/2020 Elsevier Patient Education  [place] injection What is this medicine? ATEZOLIZUMAB (a te zoe LIZ ue mab) is a monoclonal antibody. It is used to treat bladder cancer (urothelial cancer), liver cancer, lung cancer, and melanoma. This medicine may be used for other purposes; ask your health care provider or pharmacist if you have questions. COMMON BRAND NAME(S): Tecentriq What should I tell my health care provider before I take this medicine? They need to know if you have any of these conditions:  autoimmune diseases like Crohn's disease, ulcerative colitis, or lupus  have had or planning to have an allogeneic stem cell transplant (uses someone else's stem cells)  history of organ transplant  history of radiation to the chest  nervous system problems like myasthenia gravis or Guillain-Barre syndrome  an unusual or allergic reaction to atezolizumab, other medicines, foods, dyes, or preservatives  pregnant or trying to get pregnant  breast-feeding How should I use this medicine? This medicine is for infusion into a vein. It is given by a health care professional in a hospital or clinic setting. A special MedGuide will be given to you before each treatment. Be sure to read this information carefully each time. Talk to your pediatrician regarding the use of this medicine in children. Special care may be needed. Overdosage: If you think you have taken too much of this medicine contact a  poison control center or emergency room at once. NOTE: This medicine is only for you. Do not share this medicine with others. What if I miss a dose? It is important not to miss your dose. Call your doctor or health care professional if you are unable to keep an appointment. What may interact with this medicine? Interactions have not been studied. This list may not describe all possible interactions. Give your health care provider a list of all the medicines, herbs, non-prescription drugs, or dietary supplements you use. Also tell them if you smoke, drink alcohol, or use illegal drugs. Some items may interact with your medicine. What should I watch for while using this medicine? Your condition will be monitored carefully while you are receiving this medicine. You may need blood work done while you are taking this medicine. Do not become pregnant while taking this medicine or for at least 5 months after stopping it. Women should inform their doctor if they wish to become pregnant or think they might be pregnant. There is a potential for serious side effects to an unborn child. Talk to your health care professional or pharmacist for more information. Do not breast-feed an infant while taking this medicine or for at least 5 months after the last dose. What side effects may I notice from receiving this medicine? Side effects that you should report to your doctor or health care professional as soon as possible:  allergic reactions like skin rash, itching or hives, swelling of the face, lips, or tongue  black, tarry stools  bloody or watery diarrhea  breathing problems  changes in vision  chest pain or chest tightness  chills  facial flushing  fever  headache  signs and symptoms of high blood sugar such as dizziness; dry mouth; dry skin; fruity breath; nausea; stomach pain; increased hunger or thirst; increased urination  signs and symptoms of liver injury like dark yellow or brown  urine; general ill feeling or flu-like symptoms; light-colored stools; loss of appetite; nausea; right upper belly pain; unusually weak or tired; yellowing of the eyes or skin  stomach pain  trouble passing urine or change in the amount of urine Side effects that usually do not require medical attention (report to your doctor or health care professional if they continue or are bothersome):  bone pain  cough  diarrhea  joint pain  muscle pain  muscle weakness  swelling of arms or legs  tiredness  weight loss This list may not describe all possible side effects. Call your doctor for medical advice about side effects. You may report side effects to FDA at 1-800-FDA-1088. Where  should I keep my medicine? This drug is given in a hospital or clinic and will not be stored at home. NOTE: This sheet is a summary. It may not cover all possible information. If you have questions about this medicine, talk to your doctor, pharmacist, or health care provider.  2021 Elsevier/Gold Standard (2020-05-23 13:59:34)

## 2021-02-10 ENCOUNTER — Other Ambulatory Visit: Payer: Self-pay | Admitting: Internal Medicine

## 2021-02-19 ENCOUNTER — Telehealth: Payer: Self-pay | Admitting: Dietician

## 2021-02-19 NOTE — Telephone Encounter (Signed)
Nutrition   Patient identified on MST for weight loss. Attempted to contact patient via telephone. Patient did not answer. Left voicemail with request for return call. Contact information provided.

## 2021-02-22 ENCOUNTER — Other Ambulatory Visit: Payer: Self-pay

## 2021-02-22 ENCOUNTER — Emergency Department (HOSPITAL_COMMUNITY): Payer: Medicare HMO

## 2021-02-22 ENCOUNTER — Inpatient Hospital Stay (HOSPITAL_COMMUNITY): Payer: Medicare HMO

## 2021-02-22 ENCOUNTER — Inpatient Hospital Stay (HOSPITAL_COMMUNITY)
Admission: EM | Admit: 2021-02-22 | Discharge: 2021-02-27 | DRG: 064 | Disposition: A | Discharge status: Rehabilitation Facility | Payer: Medicare HMO | Attending: Family Medicine | Admitting: Family Medicine

## 2021-02-22 ENCOUNTER — Encounter (HOSPITAL_COMMUNITY): Payer: Self-pay

## 2021-02-22 DIAGNOSIS — S06340S Traumatic hemorrhage of right cerebrum without loss of consciousness, sequela: Secondary | ICD-10-CM | POA: Diagnosis not present

## 2021-02-22 DIAGNOSIS — Z7983 Long term (current) use of bisphosphonates: Secondary | ICD-10-CM

## 2021-02-22 DIAGNOSIS — G8194 Hemiplegia, unspecified affecting left nondominant side: Secondary | ICD-10-CM | POA: Diagnosis present

## 2021-02-22 DIAGNOSIS — Z9221 Personal history of antineoplastic chemotherapy: Secondary | ICD-10-CM | POA: Diagnosis not present

## 2021-02-22 DIAGNOSIS — Z880 Allergy status to penicillin: Secondary | ICD-10-CM

## 2021-02-22 DIAGNOSIS — Z85118 Personal history of other malignant neoplasm of bronchus and lung: Secondary | ICD-10-CM

## 2021-02-22 DIAGNOSIS — D3502 Benign neoplasm of left adrenal gland: Secondary | ICD-10-CM | POA: Diagnosis present

## 2021-02-22 DIAGNOSIS — Z87891 Personal history of nicotine dependence: Secondary | ICD-10-CM | POA: Diagnosis not present

## 2021-02-22 DIAGNOSIS — E785 Hyperlipidemia, unspecified: Secondary | ICD-10-CM | POA: Diagnosis present

## 2021-02-22 DIAGNOSIS — I611 Nontraumatic intracerebral hemorrhage in hemisphere, cortical: Secondary | ICD-10-CM | POA: Diagnosis present

## 2021-02-22 DIAGNOSIS — Z79899 Other long term (current) drug therapy: Secondary | ICD-10-CM | POA: Diagnosis not present

## 2021-02-22 DIAGNOSIS — J9601 Acute respiratory failure with hypoxia: Secondary | ICD-10-CM | POA: Diagnosis present

## 2021-02-22 DIAGNOSIS — R1310 Dysphagia, unspecified: Secondary | ICD-10-CM | POA: Diagnosis not present

## 2021-02-22 DIAGNOSIS — Z9911 Dependence on respirator [ventilator] status: Secondary | ICD-10-CM | POA: Diagnosis not present

## 2021-02-22 DIAGNOSIS — R4189 Other symptoms and signs involving cognitive functions and awareness: Secondary | ICD-10-CM | POA: Diagnosis present

## 2021-02-22 DIAGNOSIS — D638 Anemia in other chronic diseases classified elsewhere: Secondary | ICD-10-CM | POA: Diagnosis present

## 2021-02-22 DIAGNOSIS — G936 Cerebral edema: Secondary | ICD-10-CM | POA: Diagnosis not present

## 2021-02-22 DIAGNOSIS — Z20822 Contact with and (suspected) exposure to covid-19: Secondary | ICD-10-CM | POA: Diagnosis present

## 2021-02-22 DIAGNOSIS — Z8249 Family history of ischemic heart disease and other diseases of the circulatory system: Secondary | ICD-10-CM | POA: Diagnosis not present

## 2021-02-22 DIAGNOSIS — I471 Supraventricular tachycardia: Secondary | ICD-10-CM | POA: Diagnosis present

## 2021-02-22 DIAGNOSIS — G44209 Tension-type headache, unspecified, not intractable: Secondary | ICD-10-CM | POA: Diagnosis not present

## 2021-02-22 DIAGNOSIS — G928 Other toxic encephalopathy: Secondary | ICD-10-CM | POA: Diagnosis present

## 2021-02-22 DIAGNOSIS — M5410 Radiculopathy, site unspecified: Secondary | ICD-10-CM | POA: Diagnosis not present

## 2021-02-22 DIAGNOSIS — Z7982 Long term (current) use of aspirin: Secondary | ICD-10-CM | POA: Diagnosis not present

## 2021-02-22 DIAGNOSIS — R519 Headache, unspecified: Secondary | ICD-10-CM | POA: Diagnosis present

## 2021-02-22 DIAGNOSIS — I619 Nontraumatic intracerebral hemorrhage, unspecified: Secondary | ICD-10-CM | POA: Diagnosis not present

## 2021-02-22 DIAGNOSIS — I609 Nontraumatic subarachnoid hemorrhage, unspecified: Secondary | ICD-10-CM | POA: Diagnosis present

## 2021-02-22 DIAGNOSIS — I6389 Other cerebral infarction: Secondary | ICD-10-CM | POA: Diagnosis not present

## 2021-02-22 DIAGNOSIS — D649 Anemia, unspecified: Secondary | ICD-10-CM | POA: Diagnosis not present

## 2021-02-22 DIAGNOSIS — I618 Other nontraumatic intracerebral hemorrhage: Principal | ICD-10-CM | POA: Diagnosis present

## 2021-02-22 DIAGNOSIS — R1314 Dysphagia, pharyngoesophageal phase: Secondary | ICD-10-CM | POA: Diagnosis present

## 2021-02-22 DIAGNOSIS — I10 Essential (primary) hypertension: Secondary | ICD-10-CM | POA: Diagnosis present

## 2021-02-22 DIAGNOSIS — F419 Anxiety disorder, unspecified: Secondary | ICD-10-CM | POA: Diagnosis present

## 2021-02-22 DIAGNOSIS — I629 Nontraumatic intracranial hemorrhage, unspecified: Secondary | ICD-10-CM | POA: Diagnosis not present

## 2021-02-22 DIAGNOSIS — K219 Gastro-esophageal reflux disease without esophagitis: Secondary | ICD-10-CM | POA: Diagnosis present

## 2021-02-22 DIAGNOSIS — R7989 Other specified abnormal findings of blood chemistry: Secondary | ICD-10-CM | POA: Diagnosis not present

## 2021-02-22 DIAGNOSIS — R29729 NIHSS score 29: Secondary | ICD-10-CM | POA: Diagnosis present

## 2021-02-22 DIAGNOSIS — E876 Hypokalemia: Secondary | ICD-10-CM | POA: Diagnosis not present

## 2021-02-22 DIAGNOSIS — J449 Chronic obstructive pulmonary disease, unspecified: Secondary | ICD-10-CM | POA: Diagnosis present

## 2021-02-22 DIAGNOSIS — R414 Neurologic neglect syndrome: Secondary | ICD-10-CM | POA: Diagnosis not present

## 2021-02-22 DIAGNOSIS — R29722 NIHSS score 22: Secondary | ICD-10-CM | POA: Diagnosis present

## 2021-02-22 LAB — URINALYSIS, ROUTINE W REFLEX MICROSCOPIC
Bacteria, UA: NONE SEEN
Bilirubin Urine: NEGATIVE
Glucose, UA: NEGATIVE mg/dL
Ketones, ur: 20 mg/dL — AB
Leukocytes,Ua: NEGATIVE
Nitrite: NEGATIVE
Protein, ur: NEGATIVE mg/dL
Specific Gravity, Urine: 1.006 (ref 1.005–1.030)
pH: 7 (ref 5.0–8.0)

## 2021-02-22 LAB — CBC
HCT: 32.8 % — ABNORMAL LOW (ref 36.0–46.0)
Hemoglobin: 11 g/dL — ABNORMAL LOW (ref 12.0–15.0)
MCH: 32.8 pg (ref 26.0–34.0)
MCHC: 33.5 g/dL (ref 30.0–36.0)
MCV: 97.9 fL (ref 80.0–100.0)
Platelets: 237 10*3/uL (ref 150–400)
RBC: 3.35 MIL/uL — ABNORMAL LOW (ref 3.87–5.11)
RDW: 14.2 % (ref 11.5–15.5)
WBC: 10.7 10*3/uL — ABNORMAL HIGH (ref 4.0–10.5)
nRBC: 0 % (ref 0.0–0.2)

## 2021-02-22 LAB — DIFFERENTIAL
Abs Immature Granulocytes: 0.03 10*3/uL (ref 0.00–0.07)
Basophils Absolute: 0 10*3/uL (ref 0.0–0.1)
Basophils Relative: 0 %
Eosinophils Absolute: 0 10*3/uL (ref 0.0–0.5)
Eosinophils Relative: 0 %
Immature Granulocytes: 0 %
Lymphocytes Relative: 5 %
Lymphs Abs: 0.5 10*3/uL — ABNORMAL LOW (ref 0.7–4.0)
Monocytes Absolute: 0.5 10*3/uL (ref 0.1–1.0)
Monocytes Relative: 4 %
Neutro Abs: 9.6 10*3/uL — ABNORMAL HIGH (ref 1.7–7.7)
Neutrophils Relative %: 91 %

## 2021-02-22 LAB — I-STAT CHEM 8, ED
BUN: 8 mg/dL (ref 8–23)
Calcium, Ion: 1.16 mmol/L (ref 1.15–1.40)
Chloride: 101 mmol/L (ref 98–111)
Creatinine, Ser: 0.7 mg/dL (ref 0.44–1.00)
Glucose, Bld: 151 mg/dL — ABNORMAL HIGH (ref 70–99)
HCT: 34 % — ABNORMAL LOW (ref 36.0–46.0)
Hemoglobin: 11.6 g/dL — ABNORMAL LOW (ref 12.0–15.0)
Potassium: 3.3 mmol/L — ABNORMAL LOW (ref 3.5–5.1)
Sodium: 139 mmol/L (ref 135–145)
TCO2: 25 mmol/L (ref 22–32)

## 2021-02-22 LAB — COMPREHENSIVE METABOLIC PANEL
ALT: 12 U/L (ref 0–44)
AST: 27 U/L (ref 15–41)
Albumin: 4.6 g/dL (ref 3.5–5.0)
Alkaline Phosphatase: 80 U/L (ref 38–126)
Anion gap: 14 (ref 5–15)
BUN: 10 mg/dL (ref 8–23)
CO2: 25 mmol/L (ref 22–32)
Calcium: 9.6 mg/dL (ref 8.9–10.3)
Chloride: 100 mmol/L (ref 98–111)
Creatinine, Ser: 0.89 mg/dL (ref 0.44–1.00)
GFR, Estimated: >60 mL/min (ref >60)
Glucose, Bld: 150 mg/dL — ABNORMAL HIGH (ref 70–99)
Potassium: 3.1 mmol/L — ABNORMAL LOW (ref 3.5–5.1)
Sodium: 139 mmol/L (ref 135–145)
Total Bilirubin: 0.7 mg/dL (ref 0.3–1.2)
Total Protein: 8.4 g/dL — ABNORMAL HIGH (ref 6.5–8.1)

## 2021-02-22 LAB — BLOOD GAS, ARTERIAL
Acid-base deficit: 2.6 mmol/L — ABNORMAL HIGH (ref 0.0–2.0)
Bicarbonate: 21.1 mmol/L (ref 20.0–28.0)
FIO2: 100
O2 Saturation: 100 %
Patient temperature: 37
pCO2 arterial: 34.6 mmHg (ref 32.0–48.0)
pH, Arterial: 7.403 (ref 7.350–7.450)
pO2, Arterial: 432 mmHg — ABNORMAL HIGH (ref 83.0–108.0)

## 2021-02-22 LAB — RESP PANEL BY RT-PCR (FLU A&B, COVID) ARPGX2
Influenza A by PCR: NEGATIVE
Influenza B by PCR: NEGATIVE
SARS Coronavirus 2 by RT PCR: NEGATIVE

## 2021-02-22 LAB — APTT: aPTT: 34 seconds (ref 24–36)

## 2021-02-22 LAB — RAPID URINE DRUG SCREEN, HOSP PERFORMED
Amphetamines: NOT DETECTED
Barbiturates: NOT DETECTED
Benzodiazepines: POSITIVE — AB
Cocaine: NOT DETECTED
Opiates: NOT DETECTED
Tetrahydrocannabinol: POSITIVE — AB

## 2021-02-22 LAB — PROTIME-INR
INR: 1.1 (ref 0.8–1.2)
Prothrombin Time: 13.7 seconds (ref 11.4–15.2)

## 2021-02-22 LAB — CBG MONITORING, ED: Glucose-Capillary: 135 mg/dL — ABNORMAL HIGH (ref 70–99)

## 2021-02-22 MED ORDER — SODIUM CHLORIDE 0.9 % IV SOLN
2500.0000 mg | Freq: Once | INTRAVENOUS | Status: AC
  Administered 2021-02-22: 2500 mg via INTRAVENOUS
  Filled 2021-02-22: qty 25

## 2021-02-22 MED ORDER — LORAZEPAM 2 MG/ML IJ SOLN
INTRAMUSCULAR | Status: AC
  Administered 2021-02-22: 2 mg
  Filled 2021-02-22: qty 1

## 2021-02-22 MED ORDER — LABETALOL HCL 5 MG/ML IV SOLN: 20.0000 mg | Freq: Once | INTRAVENOUS | Status: DC | PRN

## 2021-02-22 MED ORDER — CLEVIDIPINE BUTYRATE 0.5 MG/ML IV EMUL
0.0000 mg/h | INTRAVENOUS | Status: DC
  Administered 2021-02-22: 1 mg/h via INTRAVENOUS
  Administered 2021-02-22: 16 mg/h via INTRAVENOUS
  Administered 2021-02-23 (×2): 4 mg/h via INTRAVENOUS
  Administered 2021-02-24: 2 mg/h via INTRAVENOUS
  Administered 2021-02-24: 1 mg/h via INTRAVENOUS
  Filled 2021-02-22 (×3): qty 50
  Filled 2021-02-22: qty 100
  Filled 2021-02-22 (×2): qty 50
  Filled 2021-02-22: qty 100

## 2021-02-22 MED ORDER — PROPOFOL 1000 MG/100ML IV EMUL
5.0000 ug/kg/min | INTRAVENOUS | Status: DC
  Administered 2021-02-22: 5 ug/kg/min via INTRAVENOUS
  Administered 2021-02-22 (×2): 50 ug/kg/min via INTRAVENOUS
  Administered 2021-02-23 (×3): 35 ug/kg/min via INTRAVENOUS
  Administered 2021-02-24: 30 ug/kg/min via INTRAVENOUS
  Administered 2021-02-24: 40 ug/kg/min via INTRAVENOUS
  Filled 2021-02-22: qty 200
  Filled 2021-02-22 (×4): qty 100

## 2021-02-22 MED ORDER — ADENOSINE 6 MG/2ML IV SOLN
6.0000 mg | Freq: Once | INTRAVENOUS | Status: AC
  Administered 2021-02-22: 6 mg via INTRAVENOUS
  Filled 2021-02-22: qty 2

## 2021-02-22 MED ORDER — ADENOSINE 6 MG/2ML IV SOLN
12.0000 mg | Freq: Once | INTRAVENOUS | Status: AC
  Administered 2021-02-22: 12 mg via INTRAVENOUS

## 2021-02-22 MED ORDER — SODIUM CHLORIDE 3 % IV BOLUS
250.0000 mL | Freq: Once | INTRAVENOUS | Status: AC
  Administered 2021-02-22: 250 mL via INTRAVENOUS
  Filled 2021-02-22: qty 500

## 2021-02-22 MED ORDER — LABETALOL HCL 5 MG/ML IV SOLN
20.0000 mg | Freq: Once | INTRAVENOUS | Status: AC
  Administered 2021-02-22: 20 mg via INTRAVENOUS

## 2021-02-22 MED ORDER — LORAZEPAM 2 MG/ML IJ SOLN
2.0000 mg | Freq: Once | INTRAMUSCULAR | Status: AC
  Administered 2021-02-22: 2 mg via INTRAVENOUS
  Filled 2021-02-22: qty 1

## 2021-02-22 MED ORDER — LABETALOL HCL 5 MG/ML IV SOLN
INTRAVENOUS | Status: AC
  Filled 2021-02-22: qty 4

## 2021-02-22 MED ORDER — ROCURONIUM BROMIDE 10 MG/ML (PF) SYRINGE
PREFILLED_SYRINGE | INTRAVENOUS | Status: AC
  Filled 2021-02-22: qty 10

## 2021-02-22 MED ORDER — ETOMIDATE 2 MG/ML IV SOLN
INTRAVENOUS | Status: AC
  Filled 2021-02-22: qty 10

## 2021-02-22 NOTE — ED Triage Notes (Signed)
EMS now informs [place] pt is a cancer pt undergoing chemo.

## 2021-02-22 NOTE — Progress Notes (Signed)
Called [facility] and report given to carrie RT.

## 2021-02-22 NOTE — ED Triage Notes (Signed)
Pt to ED with c/o headache (R sided sinus) which began today around 2pm. No Neuro deficits, negative stroke screen. Arrives A+O, VSS.

## 2021-02-22 NOTE — ED Provider Notes (Addendum)
[facility] DEPT Provider Note   CSN: [id number] Arrival date & time: 02/22/21  2008  An emergency department physician performed an initial assessment on this suspected stroke patient at 2055.  History Chief Complaint  Patient presents with   Headache    Tonya [name] is a 75 y.o. female.  Patient with history of lung cancer reportedly in remission now, brought in by husband chief complaint altered mental status.  He states that she was doing well this morning woke up and ate breakfast and was walking around the home.  She developed a headache around 10 or 11 AM and then went into her room to lay down.  Throughout the neck several hours she became more more confused appearing.  She was not improving so the patient's husband called 911 and brought the patient to the ER.  Patient currently unable to give additional history or any additional review of systems.      Past Medical History:  Diagnosis Date   Adenoma of left adrenal gland    Cancer ([place])    Lung   Colon polyps    hyperplastic   Complication of anesthesia    Very emotional and cries after anesthesia   GERD (gastroesophageal reflux disease)    Kidney stone    Liver lesion    Microhematuria     Patient Active Problem List   Diagnosis Date Noted   Intracranial hemorrhage ([place]) 02/22/2021   Chemotherapy induced neutropenia ([place]) 01/08/2021   Bradycardia 01/01/2021   Hypomagnesemia 12/10/2020   Nausea without vomiting 12/10/2020   Status post thoracentesis    Pleural effusion, right 11/01/2020   Adenocarcinoma of right lung, 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[place]) 09/11/2020   Encounter for antineoplastic chemotherapy 09/11/2020   S/P Robotic Assited Video Thoracoscopy with Right Upper Lobectomy Lung 08/26/2020   Mediastinal adenopathy 07/18/2020   Lung nodules    Lung nodule 06/14/2020   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   Tobacco dependence 05/29/2020   Exertional chest pain 05/28/2020    Numbness and tingling of left arm and leg 07/01/2012   Hyperlipidemia LDL goal < 100 07/01/2012   Chest pressure 06/30/2012   GERD (gastroesophageal reflux disease) 06/30/2012    Past Surgical History:  Procedure Laterality Date   ABDOMINAL HYSTERECTOMY     APPENDECTOMY     BRONCHIAL BIOPSY  07/18/2020   Procedure: BRONCHIAL BIOPSIES;  Surgeon: [name], DO;  Location: [facility];  Service: Pulmonary;;   BRONCHIAL BRUSHINGS  07/18/2020   Procedure: BRONCHIAL BRUSHINGS;  Surgeon: [name], DO;  Location: [facility];  Service: Pulmonary;;   BRONCHIAL NEEDLE ASPIRATION BIOPSY  07/18/2020   Procedure: BRONCHIAL NEEDLE ASPIRATION BIOPSIES;  Surgeon: [name], DO;  Location: [facility];  Service: Pulmonary;;   BRONCHIAL WASHINGS  07/18/2020   Procedure: BRONCHIAL WASHINGS;  Surgeon: [name], DO;  Location: [facility];  Service: Pulmonary;;   FIDUCIAL MARKER PLACEMENT  07/18/2020   Procedure: FIDUCIAL MARKER PLACEMENT;  Surgeon: [name], DO;  Location: [facility];  Service: Pulmonary;;   INTERCOSTAL NERVE BLOCK Right 08/26/2020   Procedure: INTERCOSTAL NERVE BLOCK;  Surgeon: [name], MD;  Location: [facility];  Service: Thoracic;  Laterality: Right;   IR THORACENTESIS ASP PLEURAL SPACE W/IMG GUIDE  10/10/2020   NODE DISSECTION Right 08/26/2020   Procedure: NODE DISSECTION;  Surgeon: [name], MD;  Location: [facility];  Service: Thoracic;  Laterality: Right;   THORACENTESIS Right 11/04/2020   Procedure: Mathews Robinsons;  Surgeon: Valeta Harms,  Octavio Graves, DO;  Location: [facility];  Service: Pulmonary;  Laterality: Right;   THORACENTESIS Right 11/29/2020   Procedure: THORACENTESIS;  Surgeon: [name], DO;  Location: [facility];  Service: Pulmonary;  Laterality: Right;   VIDEO BRONCHOSCOPY WITH ENDOBRONCHIAL NAVIGATION N/A 07/18/2020   Procedure: VIDEO BRONCHOSCOPY WITH ENDOBRONCHIAL NAVIGATION;  Surgeon: [name], DO;   Location: [facility];  Service: Pulmonary;  Laterality: N/A;   VIDEO BRONCHOSCOPY WITH ENDOBRONCHIAL ULTRASOUND  07/18/2020   Procedure: VIDEO BRONCHOSCOPY WITH ENDOBRONCHIAL ULTRASOUND;  Surgeon: [name], DO;  Location: [facility];  Service: Pulmonary;;     OB History   No obstetric history on file.     Family History  Problem Relation Age of Onset   Arrhythmia Mother        s/p Pacer    Heart attack Father    Heart disease Sister    Colon cancer Neg Hx     Social History   Tobacco Use   Smoking status: Former    Packs/day: 0.30    Years: 60.00    Pack years: 18.00    Types: Cigarettes    Quit date: 06/11/2020    Years since quitting: 0.7   Smokeless tobacco: Never  Vaping Use   Vaping Use: Never used  Substance Use Topics   Alcohol use: Yes    Comment: 0.5 drink per month   Drug use: No    Home Medications Prior to Admission medications   Medication Sig Start Date End Date Taking? Authorizing Provider  aspirin EC 81 MG tablet Take 81 mg by mouth daily. Swallow whole.   Yes [provider]  acetaminophen (TYLENOL) 500 MG tablet Take 2 tablets (1,000 mg total) by mouth every 6 (six) hours as needed. Patient taking differently: Take 1,000 mg by mouth every 6 (six) hours as needed for mild pain or moderate pain. 08/27/20   Barrett, Erin R, PA-C  albuterol (VENTOLIN HFA) 108 (90 Base) MCG/ACT inhaler Inhale 2 puffs into the lungs every 6 (six) hours as needed for wheezing or shortness of breath. 01/15/21   [name], DO  alendronate (FOSAMAX) 70 MG tablet Take 70 mg by mouth every Saturday. 07/04/20   [provider]  ALPRAZolam Duanne Moron) 1 MG tablet Take 1 mg by mouth in the morning, at noon, and at bedtime.    [provider]  Calcium Carbonate-Vitamin D (CALCIUM + D PO) Take 1 tablet by mouth 2 (two) times daily.    [provider]  cyclobenzaprine (FLEXERIL) 5 MG tablet Take 5 mg by mouth 3 (three) times daily as  needed for muscle spasms (sciatic nerve).  07/02/20   [provider]  dexamethasone (DECADRON) 2 MG tablet Take 4 mg by mouth See admin instructions. Take 4 mg mouth twice daily the day before, day of and 4 days after  the chemotherapy every 3 weeks. 10/10/20   [provider]  fluconazole (DIFLUCAN) 100 MG tablet Take 1 tablet (100 mg total) by mouth daily. 02/06/21   Heilingoetter, Cassandra L, PA-C  folic acid (FOLVITE) 1 MG tablet Take 1 tablet (1 mg total) by mouth daily. Patient taking differently: Take 1 mg by mouth daily at 12 noon. 10/10/20   Curt Bears, MD  guaiFENesin (MUCINEX) 600 MG 12 hr tablet Take 2 tablets (1,200 mg total) by mouth 2 (two) times daily as needed. Patient taking differently: Take 1,200 mg by mouth 2 (two) times daily as needed for to loosen phlegm. 08/29/20  Barrett, Erin R, PA-C  ibuprofen (ADVIL) 800 MG tablet Take 1 tablet (800 mg total) by mouth every 8 (eight) hours as needed for moderate pain. 08/29/20   Barrett, Erin R, PA-C  LORazepam (ATIVAN) 0.5 MG tablet Take 1 tablet (0.5 mg total) by mouth daily as needed for anxiety. And nausea. 12/17/20   Curt Bears, MD  magnesium oxide (MAG-OX) 400 (241.3 Mg) MG tablet Take 1 tablet (400 mg total) by mouth 2 (two) times daily. 11/26/20   Curt Bears, MD  naproxen (NAPROSYN) 375 MG tablet Take 1 tablet (375 mg total) by mouth 2 (two) times daily as needed for moderate pain. 04/02/20   Petrucelli, Samantha R, PA-C  omeprazole (PRILOSEC) 40 MG capsule Take 40 mg by mouth 2 (two) times daily.    [provider]  ondansetron (ZOFRAN) 8 MG tablet Take 1 tablet (8 mg total) by mouth every 8 (eight) hours as needed for nausea or vomiting. Starting at least 3 days after chemotherapy 12/10/20   Heilingoetter, Cassandra L, PA-C  oxyCODONE-acetaminophen (PERCOCET) 10-325 MG tablet Take 1 tablet by mouth every 4 (four) hours as needed for pain (sciatic nerve pain).    [provider]   pregabalin (LYRICA) 25 MG capsule Take 1 capsule (25 mg total) by mouth 2 (two) times daily. 01/03/21   Lightfoot, Lucile Crater, MD  prochlorperazine (COMPAZINE) 10 MG tablet TAKE 1 TABLET(10 MG) BY MOUTH EVERY 6 HOURS AS NEEDED FOR NAUSEA OR VOMITING Patient taking differently: Take 10 mg by mouth every 6 (six) hours as needed for nausea or vomiting. 02/10/21   Curt Bears, MD  rosuvastatin (CRESTOR) 10 MG tablet Take 10 mg by mouth at bedtime. 10/25/20   [provider]    Allergies    Other and Penicillins  Review of Systems   Review of Systems  Unable to perform ROS: Mental status change   Physical Exam Updated Vital Signs BP (!) 157/85   Pulse (!) 127   Temp 98.9 F (37.2 C) (Oral)   Resp 17   Ht 5\' 4"  (1.626 m)   Wt 62 kg   SpO2 100%   BMI 23.46 kg/m   Physical Exam Constitutional:      Comments: Moderate to severely obtunded.  HENT:     Head: Normocephalic.  Eyes:     Comments: Pupils 4-3 bilaterally and sluggish.  Cardiovascular:     Rate and Rhythm: Normal rate.  Pulmonary:     Effort: Pulmonary effort is normal.  Neurological:     Comments: Patient is obtunded.  Does not follow commands.  Does not squeeze my hands.  Has minimal withdrawal to pain.  If I repeat loudly in her ear regarding her name she will answer and say her name clearly but only after multiple questioning.    ED Results / Procedures / Treatments   Labs (all labs ordered are listed, but only abnormal results are displayed) Labs Reviewed  CBC - Abnormal; Notable for the following components:      Result Value   WBC 10.7 (*)    RBC 3.35 (*)    Hemoglobin 11.0 (*)    HCT 32.8 (*)    All other components within normal limits  DIFFERENTIAL - Abnormal; Notable for the following components:   Neutro Abs 9.6 (*)    Lymphs Abs 0.5 (*)    All other components within normal limits  COMPREHENSIVE METABOLIC PANEL - Abnormal; Notable for the following components:   Potassium 3.1 (*)  Glucose, Bld 150 (*)    Total Protein 8.4 (*)    All other components within normal limits  BLOOD GAS, ARTERIAL - Abnormal; Notable for the following components:   pO2, Arterial 432 (*)    Acid-base deficit 2.6 (*)    All other components within normal limits  I-STAT CHEM 8, ED - Abnormal; Notable for the following components:   Potassium 3.3 (*)    Glucose, Bld 151 (*)    Hemoglobin 11.6 (*)    HCT 34.0 (*)    All other components within normal limits  CBG MONITORING, ED - Abnormal; Notable for the following components:   Glucose-Capillary 135 (*)    All other components within normal limits  RESP PANEL BY RT-PCR (FLU A&B, COVID) ARPGX2  PROTIME-INR  APTT  ETHANOL  RAPID URINE DRUG SCREEN, HOSP PERFORMED  URINALYSIS, ROUTINE W REFLEX MICROSCOPIC    EKG None  Radiology DG Chest Portable 1 View  Result Date: 02/22/2021 CLINICAL DATA:  Check endotracheal tube placement EXAM: PORTABLE CHEST 1 VIEW COMPARISON:  12/24/2020 FINDINGS: Endotracheal tube is now seen 2 cm above the carina. Cardiac shadow is within normal limits. Aortic calcifications are seen. Left lung is clear. Volume loss in the right lung base is noted stable from the prior exam with small effusion. The overall appearance is unchanged. No bony abnormality is noted. IMPRESSION: Endotracheal tube in satisfactory position. Stable chronic changes from April in the right base. Electronically Signed   By: Inez Catalina M.D.   On: 02/22/2021 22:45   CT HEAD CODE STROKE WO CONTRAST  Result Date: 02/22/2021 CLINICAL DATA:  Code stroke.  Right-sided weakness and headache EXAM: CT HEAD WITHOUT CONTRAST TECHNIQUE: Contiguous axial images were obtained from the base of the skull through the vertex without intravenous contrast. COMPARISON:  None. FINDINGS: Brain: Intraparenchymal hematoma centered in the high right parietal lobe measuring approximately 4.0 x 2.5 x 4.6 cm (volume = 24 cm^3). There is subarachnoid extension over the right  convexity and into the basal cisterns. No midline shift or other mass effect. Vascular: No abnormal hyperdensity of the major intracranial arteries or dural venous sinuses. No intracranial atherosclerosis. Skull: The visualized skull base, calvarium and extracranial soft tissues are normal. Sinuses/Orbits: No fluid levels or advanced mucosal thickening of the visualized paranasal sinuses. No mastoid or middle ear effusion. The orbits are normal. IMPRESSION: 1. Intraparenchymal hematoma centered in the high right parietal lobe with subarachnoid extension over the right convexity and into the basal cisterns. 2. No midline shift or other mass effect. Critical Value/emergent results were called by telephone at the time of interpretation on 02/22/2021 at 9:08 pm to provider [facility] , who verbally acknowledged these results. Electronically Signed   By: Ulyses Jarred M.D.   On: 02/22/2021 21:09    Procedures .Critical Care  Date/Time: 02/22/2021 11:02 PM Performed by: Luna Fuse, MD Authorized by: Luna Fuse, MD   Critical care provider statement:    Critical care time (minutes):  45   Critical care was time spent personally by me on the following activities:  Discussions with consultants, evaluation of patient's response to treatment, examination of patient, ordering and performing treatments and interventions, ordering and review of laboratory studies, ordering and review of radiographic studies, pulse oximetry, re-evaluation of patient's condition, obtaining history from patient or surrogate and review of old charts Comments:        Date/Time: 02/22/2021 11:03 PM Performed by: Luna Fuse, MD Comments: 7.5 ET tube  used to intubate the patient.  100 mg of rocuronium and 20 mg etomidate provided for RSI.  Glide scope was used with good visualization of the cords, no desaturations noted.  First attempt successful intubation.  Breath sounds are equal bilaterally post intubation and good color  change on the CO2 color monitor.      Medications Ordered in ED Medications  clevidipine (CLEVIPREX) infusion 0.5 mg/mL (16 mg/hr Intravenous New Bag/Given 02/22/21 2310)  propofol (DIPRIVAN) 1000 MG/100ML infusion (50 mcg/kg/min  62 kg Intravenous Bolus 02/22/21 2245)  labetalol (NORMODYNE) injection 20 mg (has no administration in time range)  labetalol (NORMODYNE) 5 MG/ML injection (has no administration in time range)  levETIRAcetam (KEPPRA) 2,500 mg in sodium chloride 0.9 % 250 mL IVPB (0 mg Intravenous Stopped 02/22/21 2243)  LORazepam (ATIVAN) injection 2 mg (2 mg Intravenous Given 02/22/21 2134)  sodium chloride 3% (hypertonic) IV bolus 250 mL (250 mLs Intravenous New Bag/Given 02/22/21 2219)  adenosine (ADENOCARD) 6 MG/2ML injection 6 mg (6 mg Intravenous Given 02/22/21 2228)  rocuronium bromide 100 MG/10ML SOSY (  Given 02/22/21 2221)  etomidate (AMIDATE) 2 MG/ML injection (  Given 02/22/21 2220)  LORazepam (ATIVAN) 2 MG/ML injection (2 mg  Given 02/22/21 2228)  adenosine (ADENOCARD) 6 MG/2ML injection 12 mg (12 mg Intravenous Given 02/22/21 2234)    ED Course  I have reviewed the triage vital signs and the nursing notes.  Pertinent labs & imaging results that were available during my care of the patient were reviewed by me and considered in my medical decision making (see chart for details).    MDM Rules/Calculators/A&P                          Stroke alert was activated.  Patient not a candidate for tPA given last known well was greater than 6 hours prior to arrival.  CT head concerning for intraparenchymal hemorrhage with subarachnoid hemorrhage component.  Cleviprex started IV Keppra started.  Patient had tremors episodes here in the ER with eyelid fluttering concerning for possible seizure episode, given 2 mg of Ativan.  This did not appear tonic-clonic.  Patient would still repeat her name when I spoke loudly in her ear, however after some time she began to no longer  respond.  Decision made to intubate the patient after consultation with her husband.  I spoke with neurosurgery as well as neurology, patient accepted admission at [facility], ICU.  Patient admitted with IV etomidate and rocuronium given for RSI.  Addendum: Patient had episodes of tachycardia spontaneously that would last several minutes heart rate going up into the 180s 190 bpm.  Initially appeared like SVT very regular rate.  No response to adenosine.  I thought perhaps she is having underlying seizure activity, given IV Ativan 2 mg IV.  Started on propofol drip.  Patient given adenosine x2 initially 6 mg and 12 mg without conversion to sinus.  Heart rates to about 190 bpm but after medications he has come down to about a steady 150 bpm, appears to be SVT without conversion with adenosine.  Patient given 20 mg beta-blocker with subsequent provement of heart rate into 110 bpm range and appears sinus.    Final Clinical Impression(s) / ED Diagnoses Final diagnoses:  Right-sided nontraumatic intracerebral hemorrhage, unspecified cerebral location [facility])  SVT (supraventricular tachycardia) ([place])    Rx / DC Orders ED Discharge Orders     None        [place],  Greggory Brandy, MD 02/22/21 2303    Luna Fuse, MD 02/22/21 [phone]

## 2021-02-22 NOTE — ED Notes (Signed)
Called [facility] for transport

## 2021-02-22 NOTE — ED Notes (Signed)
Given report to Tonya [name] at [facility]

## 2021-02-22 NOTE — Consult Note (Signed)
[facility] Tele[facility]urology Consult Services   Date of Service:   02/22/2021 20:57:16  Diagnosis:       I61.1 - Intracerebral hemorrhage in hemisphere, cortical  Impression:      75yo F with stage 2 lung cancer s/p R upper lobectomy, currently undergoing chemo, HL presenting with AMS. On teleneuro exam, patient is stuporous, nonverbal, not following most commands, with R gaze deviation, and generalized but L>R weakness. CT head shows a large intraparenchymal hemorrhage within the R parietal lobe as well as some R frontal subarachnoid hemorrhage. The cause for this hemorrhage is unclear, as patient has no known history of brain mets and she was not significantly hypertensive on arrival. Patient is quite tremulous/twitchy, raising some concern for seizure as well. Recommend transfer for higher level of care including Neurosurgery/Neurocritical Care.  Metrics: Last Known Well: 02/22/2021 11:00:00 [facility] Notification Time: 02/22/2021 20:57:16 Arrival Time: 02/22/2021 20:08:00 Stamp Time: 02/22/2021 20:57:16 Initial Response Time: 02/22/2021 21:02:59 Symptoms: AMS. NIHSS Start Assessment Time: 02/22/2021 21:04:36 Patient is not a candidate for Thrombolytic. Thrombolytic Medical Decision: 02/22/2021 21:03:26 Patient was not deemed candidate for Thrombolytic because of following reasons: Current or Previous ICH.  CT head was reviewed and results were: 1. Intraparenchymal hematoma centered in the high right parietal lobe with subarachnoid extension over the right convexity and into the basal cisterns. 2. No midline shift or other mass effect.  ED Physician notified of diagnostic impression and management plan on 02/22/2021 21:20:00  Advanced Imaging: Advanced Imaging Not Recommended because:  Clinical presentation is not suggestion of LVO or Low clinical suspicion of LVO based on presentation   Our recommendations are outlined below.  Recommendations:        TRANSFER FOR HIGHER LEVEL OF CARE       - Q1h neuro checks       - SBP goal <140, nicardipine or clevidipine gtt       - Neurosurgery consult       - NPO until swallow eval, HOB 30 degrees       - repeat CT in 6-8 hours (or sooner if clinical worsening)       - To assess for underlying cause of bleed: MRI brain with and without gad, MRA and MRV head       - Keep platelets >100k, INR<1.4       - Maintain normothermia, Tylenol PRN       - Strict glucose control with RISS or insulin gtt       - Load Keppra 2.5g IV x 1 now, then 1g BID       - PT/OT/ST       - DVT prophylaxis: SCDs only for now    Sign Out:       Discussed with Emergency Department Provider    ------------------------------------------------------------------------------  History of Present Illness: Patient is a 75 year old Female.  Patient was brought by EMS for symptoms of AMS.  75yo F with stage 2 lung cancer s/p R upper lobectomy, currently undergoing chemo, HL presenting with AMS. Patient was last seen normal by her family at about 11am. She went to lay down and rest. She had been complaining of a headache. Her husband tried to assist her up to the bathroom and she had generalized weakness and was unable to walk. As the day progressed he noted that she seemed to worsen. Patient has no known history of brain mets, stroke, or seizure.    Past Medical History:      Hyperlipidemia  There is NO history of Hypertension      There is NO history of Diabetes Mellitus      There is NO history of Atrial Fibrillation      There is NO history of Coronary Artery Disease      There is NO history of Stroke      There is NO history of Covid-19  Social History:Unable to obtain due to Patient Status  Family History:Unable to obtain due to Patient Status  Review of System:  14 Points Review of Systems was performed and was negative except mentioned in HPI.  Anticoagulant use:  No  Antiplatelet use: Yes  ASA  Allergies:  Reviewed     Examination: BP(135//87), Pulse(84), Blood Glucose(135) 1A: Level of Consciousness - Requires repeated stimulation to arouse + 2 1B: Ask Month and Age - Could Not Answer Either Question Correctly + 2 1C: Blink Eyes & Squeeze Hands - Performs 0 Tasks + 2 2: Test Horizontal Extraocular Movements - Forced Gaze Palsy: Cannot Be Overcome + 2 3: Test Visual Fields - No Visual Loss + 0 4: Test Facial Palsy (Use Grimace if Obtunded) - Normal symmetry + 0 5A: Test Left Arm Motor Drift - Drift, hits bed + 2 5B: Test Right Arm Motor Drift - Drift, but doesn't hit bed + 1 6A: Test Left Leg Motor Drift - No Effort Against Gravity + 3 6B: Test Right Leg Motor Drift - No Effort Against Gravity + 3 7: Test Limb Ataxia (FNF/Heel-Shin) - Paralyzed + 0 8: Test Sensation - Normal; No sensory loss + 0 9: Test Language/Aphasia - Mute/Global Aphasia: No Usable Speech/Auditory Comprehension + 3 10: Test Dysarthria - Mute/Anarthric + 2 11: Test Extinction/Inattention - No abnormality + 0  NIHSS Score: 22   Pre-Morbid Modified Rankin Scale: 0 Points = No symptoms at all   Patient/Family was informed the Neurology Consult would occur via TeleHealth consult by way of interactive audio and video telecommunications and consented to receiving care in this manner.   Patient is being evaluated for possible acute neurologic impairment and high probability of imminent or life-threatening deterioration. I spent total of 35 minutes providing care to this patient, including time for face to face visit via telemedicine, review of medical records, imaging studies and discussion of findings with providers, the patient and/or family.   Dr Damaris Hippo   [facility] [phone]  Case [id number]

## 2021-02-23 ENCOUNTER — Inpatient Hospital Stay (HOSPITAL_COMMUNITY): Payer: Medicare HMO

## 2021-02-23 DIAGNOSIS — I619 Nontraumatic intracerebral hemorrhage, unspecified: Secondary | ICD-10-CM

## 2021-02-23 LAB — BASIC METABOLIC PANEL
Anion gap: 11 (ref 5–15)
BUN: 8 mg/dL (ref 8–23)
CO2: 24 mmol/L (ref 22–32)
Calcium: 8.9 mg/dL (ref 8.9–10.3)
Chloride: 106 mmol/L (ref 98–111)
Creatinine, Ser: 0.75 mg/dL (ref 0.44–1.00)
GFR, Estimated: >60 mL/min (ref >60)
Glucose, Bld: 132 mg/dL — ABNORMAL HIGH (ref 70–99)
Potassium: 3 mmol/L — ABNORMAL LOW (ref 3.5–5.1)
Sodium: 141 mmol/L (ref 135–145)

## 2021-02-23 LAB — TYPE AND SCREEN
ABO/RH(D): O POS
Antibody Screen: NEGATIVE

## 2021-02-23 LAB — LIPID PANEL
Cholesterol: 204 mg/dL — ABNORMAL HIGH (ref 0–200)
HDL: 83 mg/dL (ref >40)
LDL Cholesterol: 101 mg/dL — ABNORMAL HIGH (ref 0–99)
Total CHOL/HDL Ratio: 2.5 RATIO
Triglycerides: 98 mg/dL (ref <150)
VLDL: 20 mg/dL (ref 0–40)

## 2021-02-23 LAB — MAGNESIUM: Magnesium: 1.5 mg/dL — ABNORMAL LOW (ref 1.7–2.4)

## 2021-02-23 LAB — PHOSPHORUS: Phosphorus: 2.8 mg/dL (ref 2.5–4.6)

## 2021-02-23 LAB — GLUCOSE, CAPILLARY: Glucose-Capillary: 120 mg/dL — ABNORMAL HIGH (ref 70–99)

## 2021-02-23 LAB — MRSA NEXT GEN BY PCR, NASAL: MRSA by PCR Next Gen: NOT DETECTED

## 2021-02-23 LAB — ETHANOL: Alcohol, Ethyl (B): <10 mg/dL (ref <10)

## 2021-02-23 MED ORDER — LORAZEPAM 2 MG/ML IJ SOLN
2.0000 mg | Freq: Once | INTRAMUSCULAR | Status: AC
  Administered 2021-02-23: 2 mg via INTRAVENOUS
  Filled 2021-02-23: qty 1

## 2021-02-23 MED ORDER — STROKE: EARLY STAGES OF RECOVERY BOOK
Freq: Once | Status: AC
  Filled 2021-02-23: qty 1

## 2021-02-23 MED ORDER — METOPROLOL TARTRATE 5 MG/5ML IV SOLN
2.5000 mg | INTRAVENOUS | Status: DC | PRN
  Administered 2021-02-24: 2.5 mg via INTRAVENOUS
  Administered 2021-02-24 – 2021-02-26 (×3): 5 mg via INTRAVENOUS
  Filled 2021-02-23 (×5): qty 5

## 2021-02-23 MED ORDER — CHLORHEXIDINE GLUCONATE CLOTH 2 % EX PADS
6.0000 | MEDICATED_PAD | Freq: Every day | CUTANEOUS | Status: DC
  Administered 2021-02-23 – 2021-02-27 (×4): 6 via TOPICAL

## 2021-02-23 MED ORDER — SENNOSIDES-DOCUSATE SODIUM 8.6-50 MG PO TABS
1.0000 | ORAL_TABLET | Freq: Two times a day (BID) | ORAL | Status: DC
  Administered 2021-02-23 – 2021-02-25 (×4): 1
  Filled 2021-02-23 (×2): qty 1

## 2021-02-23 MED ORDER — ACETAMINOPHEN 650 MG RE SUPP: 650.0000 mg | RECTAL | Status: DC | PRN

## 2021-02-23 MED ORDER — PANTOPRAZOLE SODIUM 40 MG IV SOLR: 40.0000 mg | Freq: Every day | INTRAVENOUS | Status: DC

## 2021-02-23 MED ORDER — CHLORHEXIDINE GLUCONATE 0.12% ORAL RINSE (MEDLINE KIT)
15.0000 mL | Freq: Two times a day (BID) | OROMUCOSAL | Status: DC
  Administered 2021-02-23 – 2021-02-27 (×8): 15 mL via OROMUCOSAL

## 2021-02-23 MED ORDER — GADOBUTROL 1 MMOL/ML IV SOLN
6.2000 mL | Freq: Once | INTRAVENOUS | Status: AC | PRN
  Administered 2021-02-23: 6.2 mL via INTRAVENOUS

## 2021-02-23 MED ORDER — SODIUM CHLORIDE 0.9 % IV SOLN
20.0000 mg/kg | Freq: Once | INTRAVENOUS | Status: AC
  Administered 2021-02-23: 1250 mg via INTRAVENOUS
  Filled 2021-02-23: qty 12.5

## 2021-02-23 MED ORDER — SENNOSIDES-DOCUSATE SODIUM 8.6-50 MG PO TABS
1.0000 | ORAL_TABLET | Freq: Two times a day (BID) | ORAL | Status: DC
  Filled 2021-02-23: qty 1

## 2021-02-23 MED ORDER — SODIUM CHLORIDE 0.9 % IV SOLN: INTRAVENOUS | Status: DC

## 2021-02-23 MED ORDER — FENTANYL CITRATE (PF) 100 MCG/2ML IJ SOLN
50.0000 ug | Freq: Once | INTRAMUSCULAR | Status: AC
  Administered 2021-02-23: 50 ug via INTRAVENOUS
  Filled 2021-02-23 (×2): qty 2

## 2021-02-23 MED ORDER — MAGNESIUM SULFATE 2 GM/50ML IV SOLN
2.0000 g | Freq: Once | INTRAVENOUS | Status: AC
  Administered 2021-02-23: 2 g via INTRAVENOUS
  Filled 2021-02-23: qty 50

## 2021-02-23 MED ORDER — POTASSIUM CHLORIDE 20 MEQ PO PACK
40.0000 meq | PACK | ORAL | Status: AC
  Administered 2021-02-23 (×2): 40 meq
  Filled 2021-02-23: qty 2

## 2021-02-23 MED ORDER — POTASSIUM CHLORIDE 20 MEQ PO PACK
40.0000 meq | PACK | ORAL | Status: DC
  Filled 2021-02-23: qty 2

## 2021-02-23 MED ORDER — ACETAMINOPHEN 325 MG PO TABS
650.0000 mg | ORAL_TABLET | ORAL | Status: DC | PRN
  Administered 2021-02-25 – 2021-02-27 (×2): 650 mg via ORAL
  Filled 2021-02-23 (×3): qty 2

## 2021-02-23 MED ORDER — LEVETIRACETAM IN NACL 500 MG/100ML IV SOLN
500.0000 mg | Freq: Two times a day (BID) | INTRAVENOUS | Status: DC
  Administered 2021-02-23 – 2021-02-27 (×8): 500 mg via INTRAVENOUS
  Filled 2021-02-23 (×8): qty 100

## 2021-02-23 MED ORDER — METOPROLOL TARTRATE 5 MG/5ML IV SOLN
INTRAVENOUS | Status: AC
  Administered 2021-02-23: 5 mg
  Filled 2021-02-23: qty 5

## 2021-02-23 MED ORDER — METOPROLOL TARTRATE 5 MG/5ML IV SOLN: 5.0000 mg | Freq: Once | INTRAVENOUS | Status: AC

## 2021-02-23 MED ORDER — PANTOPRAZOLE SODIUM 40 MG PO PACK
40.0000 mg | PACK | Freq: Every day | ORAL | Status: DC
  Administered 2021-02-23 – 2021-02-24 (×2): 40 mg
  Filled 2021-02-23: qty 20

## 2021-02-23 MED ORDER — ACETAMINOPHEN 160 MG/5ML PO SOLN
650.0000 mg | ORAL | Status: DC | PRN
  Administered 2021-02-24: 650 mg
  Filled 2021-02-23: qty 20.3

## 2021-02-23 MED ORDER — ORAL CARE MOUTH RINSE
15.0000 mL | OROMUCOSAL | Status: DC
  Administered 2021-02-23 – 2021-02-24 (×17): 15 mL via OROMUCOSAL

## 2021-02-23 NOTE — Consult Note (Signed)
NAME:  Tonya [name], MRN:  [id number], DOB:  1946-02-15, LOS: 1 ADMISSION DATE:  02/22/2021, CONSULTATION DATE:  02/23/21 REFERRING MD:  Neuro, CHIEF COMPLAINT:  brain bleed   History of Present Illness:  Patient intubated and sedated. Can not obtain history per patient. History per EMR as follows: Headache mid morning. More confused through afternoon. Went t [place] via EMS. Teleneurology consult revealed "patient is stuporous, nonverbal, not following most commands, with R gaze deviation, and generalized but L>R weakness. CT head shows a large intraparenchymal hemorrhage within the R parietal lobe as well as some R frontal subarachnoid hemorrhage. The cause for this hemorrhage is unclear, as patient has no known history of brain mets and she was not significantly hypertensive on arrival. Patient is quite tremulous/twitchy, raising some concern for seizure as well."  CT head revealed Intraparenchymal hematoma centered in the high right parietal lobe measuring approximately 4.0 x 2.5 x 4.6 cm with subarachnoid extension. No mid line shift or mass effect.  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 rate 20, height 5\' 4"  (1.626 m), weight 62.4 kg, SpO2 100 %.    Vent Mode: PSV;CPAP FiO2 (%):  [40 %-100 %] 40 % Set Rate:  [16 bmp] 16 bmp Vt Set:  [470 mL] 470 mL PEEP:  [5 cmH20] 5 cmH20 Pressure Support:  [12 cmH20] 12 cmH20 Plateau Pressure:  [15 cmH20-17 cmH20] 17 cmH20   Intake/Output Summary (Last 24 hours) at 02/23/2021 0942 Last data filed at 02/23/2021 0800 Gross per 24 hour  Intake 384.83 ml  Output 1200 ml  Net -815.17 ml   Filed Weights   02/22/21  2026 02/23/21 0125  Weight: 62 kg 62.4 kg    Examination: General: intubated, sedated HENT: AT, [place] Lungs: coarse ventilated sounds, initiating breaths on vent Cardiovascular: tachy, no murmurs Abdomen: ND, BS present Extremities: warm, no edema Neuro: moves R extremities, does not move left, PERRL, does not follow commands for me   Labs/imaging that I havepersonally reviewed  (right click and "Reselect all SmartList Selections" daily)  CT head, CXR, chemistries, CBC  Resolved Hospital Problem list   N/a  Assessment & Plan:  Brain bleed: Discussed with Dr. Leonie Man, concern for venous sinus thrombosis based on initial imaging. --Images per neuro --NSG has consulted, appreciate assistance --SBP < 140, cleviprex low dose, consider oral medication to help wean --Frequent neuro exams --ppx keppra  R Arm tremor: concern for seizure --ativan x 1 --cVEEG --continue ppx keppra  NSCLC: no evidence of mets in brain. Hypercoagulable state puts at risk for venous sinus thrombosis.  Acute Hypoxemic respiratory failure: due to encephalopathy from brian bleed. --PRVC, PST trials --VAP bundle --Stress ulcer ppx  Toxic metabolic encephalopathy: Due to brian bleed --propofol, prn fentanyl, RASS -2  Hold TF for now, if imaging stable and no intervention plan to feed later in day.   Best Practice (right click and "Reselect all SmartList Selections" daily)   Diet/type: NPO w/ meds via tube Pain/Anxiety/Delirium protocol RASS goal: -1 to -2 VAP protocol (if indicated): Yes DVT prophylaxis: not indicated brain bleed GI prophylaxis: PPI Glucose control:  not indicated Central venous access:  N/A Arterial line:  N/A Foley:  Yes, and it is still needed Mobility:  bed rest  PT consulted: N/A Studies pending: CT , MRI, and other EEG Culture data pending:none Last reviewed culture data:today Antibiotics:not indicated  Antibiotic de-escalation: N/A Stop date: N/A Daily labs:  requested Code Status:  full code Last date of multidisciplinary goals of care discussion [n/a] Disposition: remains critically ill, will stay in intensive care        Labs   CBC: Recent Labs  Lab 02/22/21 2100 02/22/21 2120  WBC 10.7*  --   NEUTROABS 9.6*  --   HGB 11.0* 11.6*  HCT 32.8* 34.0*  MCV 97.9  --   PLT 237  --     Basic Metabolic Panel: Recent Labs  Lab 02/22/21 2100 02/22/21 2120 02/23/21 0344  NA 139 139  --   K 3.1* 3.3*  --   CL 100 101  --   CO2 25  --   --   GLUCOSE 150* 151*  --   BUN 10 8  --   CREATININE 0.89 0.70  --   CALCIUM 9.6  --   --   MG  --   --  1.5*  PHOS  --   --  2.8   GFR: Estimated Creatinine Clearance: 54.9 mL/min (by C-G formula based on SCr of 0.7 mg/dL). Recent Labs  Lab 02/22/21 2100  WBC 10.7*    Liver Function Tests: Recent Labs  Lab 02/22/21 2100  AST 27  ALT 12  ALKPHOS 80  BILITOT 0.7  PROT 8.4*  ALBUMIN 4.6   No results for input(s): LIPASE, AMYLASE in the last 168 hours. No results for input(s): AMMONIA in the last 168 hours.  ABG    Component Value Date/Time   PHART 7.403 02/22/2021 2250   PCO2ART 34.6 02/22/2021 2250   PO2ART 432 (H) 02/22/2021 2250   HCO3 21.1 02/22/2021 2250   TCO2 25 02/22/2021 2120   ACIDBASEDEF 2.6 (H) 02/22/2021 2250   O2SAT 100.0 02/22/2021 2250     Coagulation Profile: Recent Labs  Lab 02/22/21 2100  INR 1.1    Cardiac Enzymes: No results for input(s): CKTOTAL, CKMB, CKMBINDEX, TROPONINI in the last 168 hours.  HbA1C: Hgb A1c MFr Bld  Date/Time Value Ref Range Status  06/30/2012 08:58 PM 5.6 <5.7 % Final    Comment:    (NOTE)                                                                       According to the ADA Clinical Practice Recommendations for 2011, when HbA1c is used as a screening test:  >=6.5%   Diagnostic of Diabetes Mellitus           (if abnormal result is confirmed) 5.7-6.4%   Increased risk of developing Diabetes  [place] References:Diagnosis and Classification of Diabetes Mellitus,Diabetes HBZJ,6967,89(FYBOF 1):S62-S69 and Standards of Medical Care in         Diabetes - 2011,Diabetes BPZW,2585,27 (Suppl 1):S11-S61.    CBG: Recent Labs  Lab 02/22/21 2050 02/23/21 0413  GLUCAP 135* 120*    Review of Systems:   Unable to obtain due to patient factors  Past Medical History:  She,  has a past medical history of Adenoma of left adrenal gland, Cancer (  [place]), Colon polyps, Complication of anesthesia, GERD (gastroesophageal reflux disease), Kidney stone, Liver lesion, and Microhematuria.   Surgical History:   Past Surgical History:  Procedure Laterality Date   ABDOMINAL HYSTERECTOMY     APPENDECTOMY     BRONCHIAL BIOPSY  07/18/2020   Procedure: BRONCHIAL BIOPSIES;  Surgeon: [name], DO;  Location: [facility];  Service: Pulmonary;;   BRONCHIAL BRUSHINGS  07/18/2020   Procedure: BRONCHIAL BRUSHINGS;  Surgeon: [name], DO;  Location: [facility];  Service: Pulmonary;;   BRONCHIAL NEEDLE ASPIRATION BIOPSY  07/18/2020   Procedure: BRONCHIAL NEEDLE ASPIRATION BIOPSIES;  Surgeon: [name], DO;  Location: [facility];  Service: Pulmonary;;   BRONCHIAL WASHINGS  07/18/2020   Procedure: BRONCHIAL WASHINGS;  Surgeon: [name], DO;  Location: [facility];  Service: Pulmonary;;   FIDUCIAL MARKER PLACEMENT  07/18/2020   Procedure: FIDUCIAL MARKER PLACEMENT;  Surgeon: [name], DO;  Location: [facility];  Service: Pulmonary;;   INTERCOSTAL NERVE BLOCK Right 08/26/2020   Procedure: INTERCOSTAL NERVE BLOCK;  Surgeon: [name], MD;  Location: [facility];  Service: Thoracic;  Laterality: Right;   IR THORACENTESIS ASP PLEURAL SPACE W/IMG GUIDE  10/10/2020   NODE DISSECTION Right 08/26/2020   Procedure: NODE DISSECTION;  Surgeon: [name], MD;  Location: [facility];  Service: Thoracic;  Laterality: Right;   THORACENTESIS Right 11/04/2020   Procedure:  Mathews Robinsons;  Surgeon: [name], DO;  Location: [facility];  Service: Pulmonary;  Laterality: Right;   THORACENTESIS Right 11/29/2020   Procedure: THORACENTESIS;  Surgeon: [name], DO;  Location: [facility];  Service: Pulmonary;  Laterality: Right;   VIDEO BRONCHOSCOPY WITH ENDOBRONCHIAL NAVIGATION N/A 07/18/2020   Procedure: VIDEO BRONCHOSCOPY WITH ENDOBRONCHIAL NAVIGATION;  Surgeon: [name], DO;  Location: [facility];  Service: Pulmonary;  Laterality: N/A;   VIDEO BRONCHOSCOPY WITH ENDOBRONCHIAL ULTRASOUND  07/18/2020   Procedure: VIDEO BRONCHOSCOPY WITH ENDOBRONCHIAL ULTRASOUND;  Surgeon: [name], DO;  Location: [facility];  Service: Pulmonary;;     Social History:   reports that she quit smoking about 8 months ago. Her smoking use included cigarettes. She has a 18.00 pack-year smoking history. She has never used smokeless tobacco. She reports current alcohol use. She reports that she does not use drugs.   Family History:  Her family history includes Arrhythmia in her mother; Heart attack in her father; Heart disease in her sister. There is no history of Colon cancer.   Allergies Allergies  Allergen Reactions   Other Anaphylaxis    Spiders   Penicillins Hives    REACTION: 20 years     Home Medications  Prior to Admission medications   Medication Sig Start Date End Date Taking? Authorizing Provider  aspirin EC 81 MG tablet Take 81 mg by mouth daily. Swallow whole.   Yes [provider]  acetaminophen (TYLENOL) 500 MG tablet Take 2 tablets (1,000 mg total) by mouth every 6 (six) hours as needed. Patient taking differently: Take 1,000 mg by mouth every 6 (six) hours as needed for mild pain or moderate pain. 08/27/20   Barrett, Erin R, PA-C  albuterol (VENTOLIN HFA) 108 (90 Base) MCG/ACT inhaler Inhale 2 puffs into the lungs every 6 (six) hours as needed for wheezing or shortness of breath. 01/15/21   [name], DO  alendronate  (FOSAMAX) 70 MG tablet Take 70 mg by mouth every Saturday. 07/04/20   [provider]  ALPRAZolam Duanne Moron) 1 MG tablet Take  1 mg by mouth in the morning, at noon, and at bedtime.    [provider]  Calcium Carbonate-Vitamin D (CALCIUM + D PO) Take 1 tablet by mouth 2 (two) times daily.    [provider]  cyclobenzaprine (FLEXERIL) 5 MG tablet Take 5 mg by mouth 3 (three) times daily as needed for muscle spasms (sciatic nerve).  07/02/20   [provider]  dexamethasone (DECADRON) 2 MG tablet Take 4 mg by mouth See admin instructions. Take 4 mg mouth twice daily the day before, day of and 4 days after  the chemotherapy every 3 weeks. 10/10/20   [provider]  fluconazole (DIFLUCAN) 100 MG tablet Take 1 tablet (100 mg total) by mouth daily. 02/06/21   Heilingoetter, Cassandra L, PA-C  folic acid (FOLVITE) 1 MG tablet Take 1 tablet (1 mg total) by mouth daily. Patient taking differently: Take 1 mg by mouth daily at 12 noon. 10/10/20   Curt Bears, MD  guaiFENesin (MUCINEX) 600 MG 12 hr tablet Take 2 tablets (1,200 mg total) by mouth 2 (two) times daily as needed. Patient taking differently: Take 1,200 mg by mouth 2 (two) times daily as needed for to loosen phlegm. 08/29/20   Barrett, Erin R, PA-C  ibuprofen (ADVIL) 800 MG tablet Take 1 tablet (800 mg total) by mouth every 8 (eight) hours as needed for moderate pain. 08/29/20   Barrett, Erin R, PA-C  LORazepam (ATIVAN) 0.5 MG tablet Take 1 tablet (0.5 mg total) by mouth daily as needed for anxiety. And nausea. 12/17/20   Curt Bears, MD  magnesium oxide (MAG-OX) 400 (241.3 Mg) MG tablet Take 1 tablet (400 mg total) by mouth 2 (two) times daily. 11/26/20   Curt Bears, MD  naproxen (NAPROSYN) 375 MG tablet Take 1 tablet (375 mg total) by mouth 2 (two) times daily as needed for moderate pain. 04/02/20   Petrucelli, Samantha R, PA-C  omeprazole (PRILOSEC) 40 MG capsule Take 40 mg by mouth 2 (two) times  daily.    [provider]  ondansetron (ZOFRAN) 8 MG tablet Take 1 tablet (8 mg total) by mouth every 8 (eight) hours as needed for nausea or vomiting. Starting at least 3 days after chemotherapy 12/10/20   Heilingoetter, Cassandra L, PA-C  oxyCODONE-acetaminophen (PERCOCET) 10-325 MG tablet Take 1 tablet by mouth every 4 (four) hours as needed for pain (sciatic nerve pain).    [provider]  pregabalin (LYRICA) 25 MG capsule Take 1 capsule (25 mg total) by mouth 2 (two) times daily. 01/03/21   Lightfoot, Lucile Crater, MD  prochlorperazine (COMPAZINE) 10 MG tablet TAKE 1 TABLET(10 MG) BY MOUTH EVERY 6 HOURS AS NEEDED FOR NAUSEA OR VOMITING Patient taking differently: Take 10 mg by mouth every 6 (six) hours as needed for nausea or vomiting. 02/10/21   Curt Bears, MD  rosuvastatin (CRESTOR) 10 MG tablet Take 10 mg by mouth at bedtime. 10/25/20   [provider]     Critical care time:      CRITICAL CARE Performed by: Lanier Clam   Total critical care time: 45 minutes  Critical care time was exclusive of separately billable procedures and treating other patients.  Critical care was necessary to treat or prevent imminent or life-threatening deterioration.  Critical care was time spent personally by me on the following activities: development of treatment plan with patient and/or surrogate as well as nursing, discussions with consultants, evaluation of patient's response to treatment, examination of patient, obtaining history from patient  or surrogate, ordering and performing treatments and interventions, ordering and review of laboratory studies, ordering and review of radiographic studies, pulse oximetry and re-evaluation of patient's condition.

## 2021-02-23 NOTE — Consult Note (Signed)
Neurosurgery Consultation  Reason for Consult: ICH/SAH Referring Physician: Theda Sers  CC: headache / confusion  HPI: This is a 75 y.o. woman w/ h/o NSCLC on chemo that presents with severe headache with progressive confusion and weakness. She went to the ED at an OSH where an Nelson was diagnosed. The patient is currently intubated and therefore unable to provide further history, no family at bedside. Review of EMR charting shows that pt was stuporous at OSH and intubated for airway protection. No known recent use of anti-platelet or anti-coagulant medications except some naproxen   ROS: A 14 point ROS was performed and is negative except as noted in the HPI.   PMHx:  Past Medical History:  Diagnosis Date   Adenoma of left adrenal gland    Cancer ([place])    Lung   Colon polyps    hyperplastic   Complication of anesthesia    Very emotional and cries after anesthesia   GERD (gastroesophageal reflux disease)    Kidney stone    Liver lesion    Microhematuria    FamHx:  Family History  Problem Relation Age of Onset   Arrhythmia Mother        s/p Pacer    Heart attack Father    Heart disease Sister    Colon cancer Neg Hx    SocHx:  reports that she quit smoking about 8 months ago. Her smoking use included cigarettes. She has a 18.00 pack-year smoking history. She has never used smokeless tobacco. She reports current alcohol use. She reports that she does not use drugs.  Exam: Vital signs in last 24 hours: Temp:  [98.9 F (37.2 C)-99.1 F (37.3 C)] 99.1 F (37.3 C) (06/19 0410) Pulse Rate:  [78-131] 94 (06/19 0732) Resp:  [7-30] 20 (06/19 0732) BP: (96-170)/(57-106) 127/68 (06/19 0732) SpO2:  [96 %-100 %] 100 % (06/19 0732) FiO2 (%):  [40 %-100 %] 40 % (06/19 0732) Weight:  [62 kg-62.4 kg] 62.4 kg (06/19 0125) General: Lying in ICU bed, appears acutely ill Head: Normocephalic and atruamatic HEENT: Neck supple Pulmonary: intubated, ETT well secured, good chest rise  bilaterally Cardiac: RRR Abdomen: S NT ND Extremities: Warm and well perfused x4 Neuro: intubated, sedation held, eyes open to stim but somnolent / quickly close again, PERRL, gaze mildly dysconjugate but midline, moving R side spontaneously, no w/d to pain on left but appears to have intact sensation to pain on the left with grimacing   Assessment and Plan: 75 y.o. woman w/ headache and progressive weakness / confusion. [place] personally reviewed, which shows R parietal ICH with some subarachnoid extension, pt's history notable for cancer and chemotherapy.   -no acute neurosurgical intervention indicated at this time -agree with MRI/MRA to evaluate for underlying mass versus vascular abnormality -please call with any concerns or questions  Judith Part, MD 02/23/21 7:41 AM [facility]

## 2021-02-23 NOTE — Evaluation (Signed)
SLP Cancellation Note  Patient Details Name: BRAIDYN PEACE MRN: [id number] DOB: [birth year]   Cancelled treatment:       Reason Eval/Treat Not Completed: Other (comment);Medical issues which prohibited therapy (pt currently intubated)   Macario Golds 02/23/2021, 7:59 AM   Kathleen Lime, MS [facility] SLP Acute Rehab Services Office [phone] Pager [phone]

## 2021-02-23 NOTE — H&P (Signed)
Neurology Stroke H&P  Tonya [name] MR# [id number] 02/23/2021   CC: Right-sided weakness and headache  History is obtained from: chart review  HPI: Tonya [name] is a 75 y.o. female stage 2 lung cancer s/p R upper lobectomy, currently undergoing chemotherapy brought to OSH for altered mental status found to have hemorrhagic stroke and transferred to [facility] for higher level of care.  Her husband stated that she was doing well this morning woke up and ate breakfast and was walking around the home.  She developed a headache around 10 or 11 AM and then went into her room to lay down.  Throughout the neck several hours she became more more confused appearing.  She was not improving so EMS called and she was brought to the ED.  She was unable to give additional history or any additional review of systems and teleneurology was consulted. Taken from teleneurology note: "On teleneuro exam, patient is stuporous, nonverbal, not following most commands, with R gaze deviation, and generalized but L>R weakness. CT head shows a large intraparenchymal hemorrhage within the R parietal lobe as well as some R frontal subarachnoid hemorrhage. The cause for this hemorrhage is unclear, as patient has no known history of brain mets and she was not significantly hypertensive on arrival. Patient is quite tremulous/twitchy, raising some concern for seizure as well."   Last Known Well: 02/22/2021 11:00:00 Patient is not a candidate for Thrombolytic. Patient was not deemed candidate for Thrombolytic because of following reasons: Current ICH. Modified Rankin Scale: 0-Completely asymptomatic and back to baseline post- stroke NIHSS: 22  ROS: Unable to obtain due to Patient Status.  Past Medical History:  Diagnosis Date   Adenoma of left adrenal gland    Cancer (HCC)    Lung   Colon polyps    hyperplastic   Complication of anesthesia    Very emotional and cries after anesthesia   GERD  (gastroesophageal reflux disease)    Kidney stone    Liver lesion    Microhematuria      Family History  Problem Relation Age of Onset   Arrhythmia Mother        s/p Pacer    Heart attack Father    Heart disease Sister    Colon cancer Neg Hx     Social History:  reports that she quit smoking about 8 months ago. Her smoking use included cigarettes. She has a 18.00 pack-year smoking history. She has never used smokeless tobacco. She reports current alcohol use. She reports that she does not use drugs.   Prior to Admission medications   Medication Sig Start Date End Date Taking? Authorizing Provider  aspirin EC 81 MG tablet Take 81 mg by mouth daily. Swallow whole.   Yes [provider]  acetaminophen (TYLENOL) 500 MG tablet Take 2 tablets (1,000 mg total) by mouth every 6 (six) hours as needed. Patient taking differently: Take 1,000 mg by mouth every 6 (six) hours as needed for mild pain or moderate pain. 08/27/20   Barrett, Erin R, PA-C  albuterol (VENTOLIN HFA) 108 (90 Base) MCG/ACT inhaler Inhale 2 puffs into the lungs every 6 (six) hours as needed for wheezing or shortness of breath. 01/15/21   [name], DO  alendronate (FOSAMAX) 70 MG tablet Take 70 mg by mouth every Saturday. 07/04/20   [provider]  ALPRAZolam Duanne Moron) 1 MG tablet Take 1 mg by mouth in the morning, at noon, and at bedtime.    [provider]  Calcium Carbonate-Vitamin D (CALCIUM + D PO) Take 1 tablet by mouth 2 (two) times daily.    [provider]  cyclobenzaprine (FLEXERIL) 5 MG tablet Take 5 mg by mouth 3 (three) times daily as needed for muscle spasms (sciatic nerve).  07/02/20   [provider]  dexamethasone (DECADRON) 2 MG tablet Take 4 mg by mouth See admin instructions. Take 4 mg mouth twice daily the day before, day of and 4 days after  the chemotherapy every 3 weeks. 10/10/20   [provider]  fluconazole (DIFLUCAN) 100 MG tablet Take 1 tablet  (100 mg total) by mouth daily. 02/06/21   Heilingoetter, Cassandra L, PA-C  folic acid (FOLVITE) 1 MG tablet Take 1 tablet (1 mg total) by mouth daily. Patient taking differently: Take 1 mg by mouth daily at 12 noon. 10/10/20   Curt Bears, MD  guaiFENesin (MUCINEX) 600 MG 12 hr tablet Take 2 tablets (1,200 mg total) by mouth 2 (two) times daily as needed. Patient taking differently: Take 1,200 mg by mouth 2 (two) times daily as needed for to loosen phlegm. 08/29/20   Barrett, Erin R, PA-C  ibuprofen (ADVIL) 800 MG tablet Take 1 tablet (800 mg total) by mouth every 8 (eight) hours as needed for moderate pain. 08/29/20   Barrett, Erin R, PA-C  LORazepam (ATIVAN) 0.5 MG tablet Take 1 tablet (0.5 mg total) by mouth daily as needed for anxiety. And nausea. 12/17/20   Curt Bears, MD  magnesium oxide (MAG-OX) 400 (241.3 Mg) MG tablet Take 1 tablet (400 mg total) by mouth 2 (two) times daily. 11/26/20   Curt Bears, MD  naproxen (NAPROSYN) 375 MG tablet Take 1 tablet (375 mg total) by mouth 2 (two) times daily as needed for moderate pain. 04/02/20   Petrucelli, Samantha R, PA-C  omeprazole (PRILOSEC) 40 MG capsule Take 40 mg by mouth 2 (two) times daily.    [provider]  ondansetron (ZOFRAN) 8 MG tablet Take 1 tablet (8 mg total) by mouth every 8 (eight) hours as needed for nausea or vomiting. Starting at least 3 days after chemotherapy 12/10/20   Heilingoetter, Cassandra L, PA-C  oxyCODONE-acetaminophen (PERCOCET) 10-325 MG tablet Take 1 tablet by mouth every 4 (four) hours as needed for pain (sciatic nerve pain).    [provider]  pregabalin (LYRICA) 25 MG capsule Take 1 capsule (25 mg total) by mouth 2 (two) times daily. 01/03/21   Lightfoot, Lucile Crater, MD  prochlorperazine (COMPAZINE) 10 MG tablet TAKE 1 TABLET(10 MG) BY MOUTH EVERY 6 HOURS AS NEEDED FOR NAUSEA OR VOMITING Patient taking differently: Take 10 mg by mouth every 6 (six) hours as needed for nausea or vomiting.  02/10/21   Curt Bears, MD  rosuvastatin (CRESTOR) 10 MG tablet Take 10 mg by mouth at bedtime. 10/25/20   [provider]    Exam: Current vital signs: BP 125/76   Pulse 87   Temp 98.9 F (37.2 C) (Oral)   Resp (!) 22   Ht 5\' 4"  (1.626 m)   Wt 62.4 kg   SpO2 100%   BMI 23.61 kg/m   Physical Exam  Constitutional: Appears well-developed and well-nourished.  Psych: Patient was intubated and sedated and could not assess. Eyes: No scleral injection HENT: No OP obstruction. Head: Normocephalic.  Cardiovascular: Normal rate and regular rhythm.  Respiratory: Effort normal, symmetric excursions bilaterally, no audible wheezing. GI: Soft.  No distension. There is no tenderness.  Skin: WDI  Neuro: Mental Status: Patient was  intubated and sedated and could fully not assess. However opened eyes to voice. Required constant noxious stimulus. Meaningful withdrawal to pain in all extremities. Visual Fields blinks to confrontation. Pupils are equal, round, and reactive to light. EOMI intact tracks with eyes.   Tone is normal. Bulk is normal.  Moves all extremities consistently. Deep Tendon Reflexes: 2+ and symmetric in the biceps and patellae. Toes are mute bilaterally FNF and HKS Patient was intubated and sedated and could not assess. Gait - Deferred  I have reviewed labs in epic and the pertinent results are:   Ref. Range 02/22/2021 21:20  Hemoglobin Latest Ref Range: 12.0 - 15.0 g/dL 11.6 (L)  HCT Latest Ref Range: 36.0 - 46.0 % 34.0 (L)   I have reviewed the images obtained: NCT head showed Intraparenchymal hematoma centered in the high right parietal lobe with subarachnoid extension over the right convexity and into the basal cisterns.  Assessment: Tonya [name] is a 75 y.o. female PMHx stage 2 lung cancer s/p R upper lobectomy, currently undergoing chemotherapy with non-traumatic right parietal hemorrhagic stroke and subarachnoid extension over right  convexity and into basilar cisterns.   Impression:  Non-traumatic right parietal hemorrhagic stroke with subarachnoid extension into basilar cisterns. ICH volume volume = 24cm^3 ICH score 2. NIHSS 22. Intubated and sedated. Stage 2 lung cancer on chemotherapy.  Plan: MRI brain without and with contrast - To assess for underlying cause of bleed. MRV head, MRA head and neck - To assess for underlying cause of bleed. Elevate head of bed keep head midline. Start clevidipine drip for goal SBP<140. X-ray chest. Admit to neuro ICU. Consult neurosurgery. Sedation, analgesics (fentanyl). Prophylactic LEV 500mg  bid 7-10 days. Hold antiplatelets and anticoagulation for now. IV fluids gentle hydration. Replete electrolytes as needed. Labs: Coags, CBC, type and cross, CMP, Mg, Phos, fasting lipids, hA1c. Normothermia - Acetaminophen for temperature >37.5C Euglycemia (~ <180) Euvolemia - Strict I/Os Repeat CT head in 6 hours. Precautions: Airway and herniation, seizure, aspiration. PPx: SCDs for now, Senna/docusate, PPI.    This patient is critically ill and at significant risk of neurological worsening, death and care requires constant monitoring of vital signs, hemodynamics,respiratory and cardiac monitoring, neurological assessment, discussion with family, other specialists and medical decision making of high complexity. I spent 71 minutes of neurocritical care time  in the care of  this patient. This was time spent independent of any time provided by nurse practitioner or PA.  Electronically signed by:  Lynnae Sandhoff, MD Page: [phone] 02/23/2021, 2:17 AM

## 2021-02-23 NOTE — Progress Notes (Signed)
LTM EEG hooked up and running - no initial skin breakdown - push button tested - neuro notified. [facility] monitoring.  

## 2021-02-23 NOTE — Progress Notes (Signed)
Pt transported to MRI and back to 4N 15 on full vent support. No complications noted.

## 2021-02-23 NOTE — Progress Notes (Signed)
Pt's HR sustaining in 140s. Called CCM to room, Shirlee Limerick NP gave order for 1x IVP metoprolol 5mg . Administered, pt's HR now 110s. Will continue to monitor.

## 2021-02-23 NOTE — Progress Notes (Signed)
Pt placed on PSV 12/5 per wean protocol. Pt is tolerating well at this time. RT to continue to monitor. RN aware.

## 2021-02-23 NOTE — Progress Notes (Signed)
Removed patient necklace (placed in specimen cup) lower partial and upper plate (placed in denture cup).

## 2021-02-23 NOTE — Progress Notes (Signed)
Patient's oncologist called for update, I provided her with Dr. Clydene Fake contact information.

## 2021-02-23 NOTE — Progress Notes (Signed)
Handed off to [facility] for MRI.

## 2021-02-23 NOTE — Progress Notes (Signed)
PT Cancellation Note  Patient Details Name: Tonya [name] MRN: [id number] DOB: 14-Jun-1946   Cancelled Treatment:    Reason Eval/Treat Not Completed: Patient not medically ready (intubated and sedated).  Wyona Almas, PT, DPT Acute Rehabilitation Services Pager [phone] Office [phone]    Deno Etienne 02/23/2021, 7:22 AM

## 2021-02-23 NOTE — Progress Notes (Signed)
Gave necklace to husband, [name], to take home.

## 2021-02-23 NOTE — Progress Notes (Addendum)
STROKE TEAM PROGRESS NOTE    Interval History  Patient remains sedated and intubated for respiratory failure on ventilatory support.  Blood pressure adequately controlled. No acute events overnight, patients husband is at bedside. He was educated about suspicion for CVST and imaging that was ordered to be done today. Patients niece, an oncologist was also updated on the phone.    Pertinent Lab Work and Imaging    02/23/21 CT Head WO IV Contrast 1. Intraparenchymal hematoma centered in the high right parietal lobe with subarachnoid extension over the right convexity and into the basal cisterns. 2. No midline shift or other mass effect.  02/23/21 MR Brain W WO Contrast  Results pending  02/23/21 MR Angio Head Neck  WO Contrast  Results pending  02/23/21 MR Venogram Head  Results pending   Physical Examination   Constitutional: Frail African American female who is intubated and sedated and not in distress. Cardiovascular: Normal RR Respiratory: No increased WOB   Mental status: Intubated and sedated eyes are closed.  Responds to sternal rub and partially opening eyes Speech: Unable to assess due to intubation  Cranial nerves: Dysconjugate gaze, with slight left eye exotropia and hypertropia.  Pupils equal reactive.  Corneal flexes are present.  Doll's eye movements are sluggish.  Unable to assess face symmetry due to ET tube  Motor: RUE + RLE with purposeful movement to noxious stimulation , LUE plegic, LLE withdraws slightly to noxious stimulation  Sensory: UTA due to sedation  Coordination: UTA due to sedation  Gait: Deferred due to patient acuity    Assessment and Plan   Tonya [name] is a 75 y.o. female w/pmh of GERD, HLD, Stage IIB (T3, N0, M0) non-small cell lung cancer, adenocarcinoma involving the right upper lobe who initially presented on OSH with AMS, found to have a right parietal parasaggital bleed for which she transferred to [facility] for further work up and  intervention.   NEURO #Right Parietal IPH with small frontal subarachnoid hemorrhage-parasagittal location suspicious for cerebral venous sinus thrombosis Patient presented with the symptoms described above. Given that her right parietal bleed is parasaggital, are evaluating for CVST. Her work up is ongoing with MRI Brain, MR Venogram Head and MRA Head and Neck imaging pending. Echocardiogram also ordered as a part of stroke work up. Stroke labs w/LDL 101, Hemoglobin A1C pending.  - No antiplatelets given bleed, however will consider initiating heparin if imaging is pertinent for sinus thrombosis  -At discharge will place ambulatory referral to neurology for stroke follow up   #Right Upper Extremity Shaking  Noted by nursing/CCM team to have RUE shaking morning of 6/19.  Continue Keppra 500 Q12 hr   CARDS #History of Ventricular Bigeminy  #History of Bradycardia  Has been seen by cardiology on an outpatient basis for bradycardia on 01/01/21, bradycardia was felt to be in the setting of ventricular bigeminy. They recommended watchful monitoring with no further testing. She does not have a history of hypertension and does not take an antihtns at home however SBP here has been elevated as high as 170.  - Continue Clevidipine for SBP < 140  - SBP goal < 140 given bleed   #Hyperlipidemia From a stroke prevention stand point, the LDL goal is < 70. Her LDL is 101. At discharge consider initiating statin, will hold off now given bleed.   RESP #Acute Hypoxemic Respiratory Failure Intubated due to respiratory failure.  - CCM on board for management   GI  Intubated and sedated  with dobhoff tube for enteral feeding - CCM on board for management  RENAL #Hypokalemia  Supplement lytes as needed. Cr WNL. - Potassium supplemented 6/19, trend   - CCM on board for management   ENDO #Diabetes Stroke Screening  Hemoglobin A1C this admission pending   HEME Hemoglobin hematocrit and platelet count  stable.   ID Afebrile. No infectious processes at this time.   Hospital day # 1  [name], NP  [facility] Nurse Practitioner Patient seen and discussed with attending physician Dr. Carilyn Goodpasture MD NOTE : I have personally obtained history,examined this patient, reviewed notes, independently viewed imaging studies, participated in medical decision making and plan of care.ROS completed by me personally and pertinent positives fully documented  I have made any additions or clarifications directly to the above note. Agree with note above.  Patient presented with altered mental status and left hemiplegia due to large right parietal parenchymal hematoma with small subarachnoid hemorrhage etiology indeterminate with strong suspicion for cerebral venous sinus thrombosis given history of lung cancer on chemotherapy.  Continue ventilatory support for respiratory failure.  Check MR I scan of the brain with MR venogram.  Strict control of hypertension with systolic goal below 888 for first 24 hours and then below 160.  Use as needed IV labetalol and hydralazine and increase p.o. medications through NG tube.  May need to be started on hypertonic saline and heparin after being the MRI results.  .Long discussion with patient's husband at the bedside and I also spoke with the patient's niece who is her oncologist over the phone and answered questions.  Discussed with Dr. Silas Flood critical care medicine.This patient is critically ill and at significant risk of neurological worsening, death and care requires constant monitoring of vital signs, hemodynamics,respiratory and cardiac monitoring, extensive review of multiple databases, frequent neurological assessment, discussion with family, other specialists and medical decision making of high complexity.I have made any additions or clarifications directly to the above note.This critical care time does not reflect procedure time, or teaching time or  supervisory time of PA/NP/Med Resident etc but could involve care discussion time.  I spent 50 minutes of neurocritical care time  in the care of  this patient.      Antony Contras, MD Medical Director [facility] Stroke Center Pager: [phone] 02/23/2021 2:24 PM   To contact Stroke Continuity provider, please refer to [email or web address] After hours, contact General Neurology

## 2021-02-23 NOTE — Progress Notes (Signed)
Pt placed back on full vent support for MRI trip.

## 2021-02-24 ENCOUNTER — Inpatient Hospital Stay (HOSPITAL_COMMUNITY): Payer: Medicare HMO

## 2021-02-24 DIAGNOSIS — J9601 Acute respiratory failure with hypoxia: Secondary | ICD-10-CM

## 2021-02-24 DIAGNOSIS — E876 Hypokalemia: Secondary | ICD-10-CM | POA: Diagnosis not present

## 2021-02-24 DIAGNOSIS — I6389 Other cerebral infarction: Secondary | ICD-10-CM

## 2021-02-24 DIAGNOSIS — Z9911 Dependence on respirator [ventilator] status: Secondary | ICD-10-CM | POA: Diagnosis not present

## 2021-02-24 LAB — ECHOCARDIOGRAM COMPLETE
AR max vel: 2.88 cm2
AV Area VTI: 2.67 cm2
AV Area mean vel: 2.59 cm2
AV Mean grad: 3 mmHg
AV Peak grad: 6.4 mmHg
Ao pk vel: 1.26 m/s
Area-P 1/2: 4.06 cm2
Height: 64 in
S' Lateral: 2.9 cm
Weight: 2201.07 oz

## 2021-02-24 LAB — HEMOGLOBIN A1C
Hgb A1c MFr Bld: 5.4 % (ref 4.8–5.6)
Mean Plasma Glucose: 108 mg/dL

## 2021-02-24 LAB — GLUCOSE, CAPILLARY: Glucose-Capillary: 110 mg/dL — ABNORMAL HIGH (ref 70–99)

## 2021-02-24 MED ORDER — OXYCODONE-ACETAMINOPHEN 5-325 MG PO TABS
1.0000 | ORAL_TABLET | ORAL | Status: DC | PRN
  Administered 2021-02-24: 1 via ORAL
  Administered 2021-02-24 – 2021-02-27 (×10): 2 via ORAL
  Filled 2021-02-24 (×3): qty 2
  Filled 2021-02-24: qty 1
  Filled 2021-02-24 (×9): qty 2

## 2021-02-24 MED ORDER — IPRATROPIUM-ALBUTEROL 0.5-2.5 (3) MG/3ML IN SOLN
3.0000 mL | Freq: Two times a day (BID) | RESPIRATORY_TRACT | Status: DC
  Administered 2021-02-24: 3 mL via RESPIRATORY_TRACT
  Filled 2021-02-24: qty 3

## 2021-02-24 MED ORDER — CYCLOBENZAPRINE HCL 10 MG PO TABS
5.0000 mg | ORAL_TABLET | Freq: Three times a day (TID) | ORAL | Status: DC
  Administered 2021-02-24 – 2021-02-25 (×3): 5 mg via ORAL
  Filled 2021-02-24 (×3): qty 1

## 2021-02-24 MED ORDER — IPRATROPIUM-ALBUTEROL 0.5-2.5 (3) MG/3ML IN SOLN
3.0000 mL | Freq: Three times a day (TID) | RESPIRATORY_TRACT | Status: DC
  Filled 2021-02-24: qty 3

## 2021-02-24 MED ORDER — IPRATROPIUM-ALBUTEROL 0.5-2.5 (3) MG/3ML IN SOLN: 3.0000 mL | Freq: Three times a day (TID) | RESPIRATORY_TRACT | Status: DC

## 2021-02-24 MED ORDER — POTASSIUM CHLORIDE 20 MEQ PO PACK
40.0000 meq | PACK | ORAL | Status: AC
  Administered 2021-02-24 (×2): 40 meq
  Filled 2021-02-24 (×2): qty 2

## 2021-02-24 NOTE — Progress Notes (Signed)
STROKE TEAM PROGRESS NOTE    Interval History  Patient remains sedated and intubated for respiratory failure on ventilatory support.  But has been weaning well this morning with plans to extubate later today.  Blood pressure adequately controlled.  MRI scan of the brain yesterday showed stable appearance of the right parietal parenchymal hemorrhage with mild cytotoxic edema but no intraventricular extension or significant midline shift.  MR venogram showed no evidence of venous sinus thrombosis.  Postcontrast MRI did not show any underlying metastasis or lesions.  Blood pressure adequately controlled.    No acute events overnight, patients husband is at bedside.  I also spoke to. Patients niece, an oncologist over the phone.    Pertinent Lab Work and Imaging    02/23/21 CT Head WO IV Contrast 1. Intraparenchymal hematoma centered in the high right parietal lobe with subarachnoid extension over the right convexity and into the basal cisterns. 2. No midline shift or other mass effect.  02/23/21 MR Brain W WO Contrast  Stable appearance of right parietal hemorrhage without evidence of underlying mass lesion or vascular malformation.  Stable small subdural and subarachnoid hemorrhage.  Small amount of blood within the ventricles but no hydrocephalus.  02/23/21 MR Angio Head Neck  WO Contrast  Normal variant MRA of circle of Willis without large vessel stenosis or occlusion or aneurysm.  02/23/21 MR Venogram Head  Normal.  No evidence of venous sinus thrombosis  Long-term EEG monitoring.  No evidence of seizures  Physical Examination   Constitutional: Frail African American female who is intubated and sedated and not in distress. Cardiovascular: Normal RR Respiratory: No increased WOB   Mental status: Intubated and sedated eyes are closed.  Responds to sternal rub and partially opening eyes she follows commands quite well Speech: Unable to assess due to intubation  Cranial nerves:  Dysconjugate gaze, with slight left eye exotropia and hypertropia.  Pupils equal reactive.  Corneal flexes are present.  Doll's eye movements are sluggish.  Unable to assess face symmetry due to ET tube  Motor: RUE + RLE with purposeful movement to noxious stimulation , LUE weak but able to move partially LLE withdraws slightly to noxious stimulation  Sensory: UTA due to sedation  Coordination: UTA due to sedation  Gait: Deferred due to patient acuity    Assessment and Plan   Tonya [name] is a 75 y.o. female w/pmh of GERD, HLD, Stage IIB (T3, N0, M0) non-small cell lung cancer, adenocarcinoma involving the right upper lobe who initially presented on OSH with AMS, found to have a right parietal parasaggital bleed for which she transferred to [facility] for further work up and intervention.   NEURO #Right Parietal IPH with small frontal subarachnoid hemorrhage-likely hypertensive  patient presented with the symptoms described above. Given that her right parietal bleed is parasaggital, are evaluating for CVST. Her work up is ongoing with MRI Brain, MR Venogram Head and MRA Head and Neck imaging pending. Echocardiogram also ordered as a part of stroke work up. Stroke labs w/LDL 101, Hemoglobin A1C pending.  - No antiplatelets given bleed, however will consider initiating heparin if imaging is pertinent for sinus thrombosis  -At discharge will place ambulatory referral to neurology for stroke follow up   #Right Upper Extremity Shaking  Noted by nursing/CCM team to have RUE shaking morning of 6/19.  Long-term EEG monitoring shows continuous generalized and right hemispheric slowing.  No definite seizure activity  CARDS #History of Ventricular Bigeminy  #History of Bradycardia  Has  been seen by cardiology on an outpatient basis for bradycardia on 01/01/21, bradycardia was felt to be in the setting of ventricular bigeminy. They recommended watchful monitoring with no further testing. She does  not have a history of hypertension and does not take an antihtns at home however SBP here has been elevated as high as 170.  - Continue Clevidipine for SBP < 140  - SBP goal < 140 given bleed   #Hyperlipidemia From a stroke prevention stand point, the LDL goal is < 70. Her LDL is 101. At discharge consider initiating statin, will hold off now given bleed.   RESP #Acute Hypoxemic Respiratory Failure Intubated due to respiratory failure.  - CCM on board for management   GI  Intubated and sedated with dobhoff tube for enteral feeding - CCM on board for management  RENAL #Hypokalemia  Supplement lytes as needed. Cr WNL. - Potassium supplemented 6/19, trend   - CCM on board for management   ENDO #Diabetes Stroke Screening  Hemoglobin A1C this admission pending   HEME Hemoglobin hematocrit and platelet count stable.   ID Afebrile. No infectious processes at this time.   Hospital day # 2   Patient presented with altered mental status and left hemiplegia due to large right parietal parenchymal hematoma with small subarachnoid hemorrhage etiology indeterminate with strong suspicion for cerebral venous sinus thrombosis given history of lung cancer on chemotherapy.  Continue ventilatory support for respiratory failure.  Extubate as tolerated per CCM.   Marland Kitchen  Strict control of hypertension with systolic goal below  697.  Use as needed IV labetalol and hydralazine and increase p.o. medications through NG tube.   Long discussion with patient's husband at the bedside and I also spoke with the patient's niece who is her oncologist over the phone and answered questions.  Discussed with Dr. Ernest Mallick critical care medicine.This patient is critically ill and at significant risk of neurological worsening, death and care requires constant monitoring of vital signs, hemodynamics,respiratory and cardiac monitoring, extensive review of multiple databases, frequent neurological assessment, discussion with  family, other specialists and medical decision making of high complexity.I have made any additions or clarifications directly to the above note.This critical care time does not reflect procedure time, or teaching time or supervisory time of PA/NP/Med Resident etc but could involve care discussion time.  I spent 35 minutes of neurocritical care time  in the care of  this patient.      Antony Contras, MD Medical Director [place] Pager: [phone] 02/24/2021 4:10 PM   To contact Stroke Continuity provider, please refer to [email or web address] After hours, contact General Neurology

## 2021-02-24 NOTE — Progress Notes (Signed)
PT Cancellation Note  Patient Details Name: Tonya [name] MRN: [id number] DOB: 10-Jan-1946   Cancelled Treatment:    Reason Eval/Treat Not Completed: Patient not medically ready this morning as they are attempting to wean, hopeful for extubation later today. RN asked PT to hold for this morning and attempt following planned extubation.   Hardie Pulley, DPT   Acute Rehabilitation Department Pager #: [phone]   Otho Bellows 02/24/2021, 9:42 AM

## 2021-02-24 NOTE — Evaluation (Signed)
Physical Therapy Evaluation Patient Details Name: Tonya [name] MRN: [id number] DOB: 11-Sep-1945 Today's Date: 02/24/2021   History of Present Illness  The pt is a 75 yo female presenting 6/18 with c/o R-sided headache. 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 PMH includes: lung cancer s/p R upper lobectomy, currently undergoing chemo, GERD, and HLD.   Clinical Impression  Pt in bed upon arrival of PT, agreeable to evaluation at this time. Prior to admission the pt was completely independent with all mobility and ADLs, enjoyed traveling with family. The pt now presents with limitations in functional mobility, strength, inattention, coordination, and motor planning due to above dx, and will continue to benefit from skilled PT to address these deficits. The pt was able to complete initial bed mobility , but required max multimodal cues for sequencing and direct cues to L extremities due to pt tendency for inattention. The pt requires consistent assist (min-modA) to maintain static sitting EOB due to intermittent L and posterior lean with pt inability to identify or correct without cues. The pt also completed transfer to [facility], but required totalA from therapist to complete stand and pivot. Mobility was further limited by BP elevation above SBP goal of <140. RN notified, will continue to benefit from skilled PT acutely to address deficits, and will likely need CIR level therapies at d/c to maximize functional return.      Follow Up Recommendations CIR    Equipment Recommendations   (defer to post acute)    Recommendations for Other Services Rehab consult     Precautions / Restrictions Precautions Precautions: Fall Precaution Comments: L inattention Restrictions Weight Bearing Restrictions: No      Mobility  Bed Mobility Overal bed mobility: Needs Assistance Bed Mobility: Supine to Sit;Sit to Supine     Supine to sit: Mod assist;+2 for physical assistance;HOB  elevated Sit to supine: Max assist;+2 for physical assistance;HOB elevated   General bed mobility comments: pt needing max multimodal cues for sequential movement. assist needed to complete movement of LLE to EOB due to inattention. modA to pull trunk to sit. maxA to reutn pt to supine    Transfers Overall transfer level: Needs assistance Equipment used: 1 person hand held assist Transfers: Sit to/from [facility] Sit to Stand: Total assist Stand pivot transfers: Total assist       General transfer comment: totalA of 1 with pt attempting to hold therapist with RUE. pivot to [facility] with pt unable to adjust position of LE, needing max-totalA despite any pt attempts to help at this time to maintain upright  Ambulation/Gait             General Gait Details: pt unable to generate any steps   Modified Rankin (Stroke Patients Only) Modified Rankin (Stroke Patients Only) Pre-Morbid Rankin Score: No symptoms Modified Rankin: Severe disability     Balance Overall balance assessment: Needs assistance Sitting-balance support: Single extremity supported;Feet supported Sitting balance-Leahy Scale: Poor Sitting balance - Comments: reliant on single UE and min-modA Postural control: Left lateral lean;Posterior lean Standing balance support: Single extremity supported;During functional activity Standing balance-Leahy Scale: Zero Standing balance comment: dependent on therapist to maintain upright                             Pertinent Vitals/Pain Pain Assessment: Faces Faces Pain Scale: No hurt Pain Intervention(s): Monitored during session    Home Living Family/patient expects to be discharged to:: Private  residence Living Arrangements: Spouse/significant other Available Help at Discharge: Family;Available 24 hours/day Type of Home: House Home Access: Level entry     Home Layout: One level Home Equipment: Grab bars - tub/shower;Shower seat;Hand held  shower head      Prior Function Level of Independence: Independent         Comments: driving, retired     Journalist, newspaper   Dominant Hand: Right    Extremity/Trunk Assessment   Upper Extremity Assessment Upper Extremity Assessment: Defer to OT evaluation    Lower Extremity Assessment Lower Extremity Assessment: RLE deficits/detail;LLE deficits/detail RLE Deficits / Details: Pt able to move against gravity, difficulty coordinating movements and grading force needed RLE Coordination: decreased fine motor;decreased gross motor LLE Deficits / Details: pt with inattention to LLE, able to move with direct multimodal cues. grossly 3-/5 LLE Sensation: decreased light touch (pt reports LLE "feels different" then stated it felt "cool") LLE Coordination: decreased fine motor;decreased gross motor    Cervical / Trunk Assessment Cervical / Trunk Assessment: Kyphotic  Communication   Communication: No difficulties  Cognition Arousal/Alertness: Awake/alert Behavior During Therapy: WFL for tasks assessed/performed;Flat affect Overall Cognitive Status: Impaired/Different from baseline Area of Impairment: Attention;Following commands;Awareness;Problem solving;Safety/judgement                   Current Attention Level: Focused   Following Commands: Follows one step commands consistently;Follows one step commands with increased time Safety/Judgement: Decreased awareness of safety;Decreased awareness of deficits   Problem Solving: Slow processing;Decreased initiation;Requires verbal cues;Difficulty sequencing General Comments: unaware of L side inattention      General Comments General comments (skin integrity, edema, etc.): BP from SBP of 130s to 160s and then 170s while pt sitting on BSC. pt then returned to supine in bed in attempt to decrease SBP    Exercises     Assessment/Plan    PT Assessment Patient needs continued PT services  PT Problem List Decreased  strength;Decreased range of motion;Decreased balance;Decreased activity tolerance;Decreased mobility;Decreased coordination;Decreased cognition;Decreased safety awareness       PT Treatment Interventions DME instruction;Gait training;Stair training;Therapeutic activities;Functional mobility training;Balance training;Therapeutic exercise;Neuromuscular re-education;Patient/family education    PT Goals (Current goals can be found in the Care Plan section)  Acute Rehab PT Goals Patient Stated Goal: to return home, be able to travel PT Goal Formulation: With patient Time For Goal Achievement: 03/10/21 Potential to Achieve Goals: Good    Frequency Min 4X/week   Barriers to discharge        Co-evaluation PT/OT/SLP Co-Evaluation/Treatment: Yes Reason for Co-Treatment: Complexity of the patient's impairments (multi-system involvement);Necessary to address cognition/behavior during functional activity;For patient/therapist safety;To address functional/ADL transfers PT goals addressed during session: Mobility/safety with mobility;Balance         AM-PAC PT "6 Clicks" Mobility  Outcome Measure Help needed turning from your back to your side while in a flat bed without using bedrails?: A Lot Help needed moving from lying on your back to sitting on the side of a flat bed without using bedrails?: A Lot Help needed moving to and from a bed to a chair (including a wheelchair)?: Total Help needed standing up from a chair using your arms (e.g., wheelchair or bedside chair)?: Total Help needed to walk in hospital room?: Total Help needed climbing 3-5 steps with a railing? : Total 6 Click Score: 8    End of Session Equipment Utilized During Treatment: Gait belt;Oxygen Activity Tolerance: Treatment limited secondary to medical complications (Comment) (BP above BP goal of <140)  Patient left: in bed;with bed alarm set Nurse Communication: Mobility status (BP elevation) PT Visit Diagnosis: Other  abnormalities of gait and mobility (R26.89);Hemiplegia and hemiparesis Hemiplegia - Right/Left: Left Hemiplegia - dominant/non-dominant: Non-dominant Hemiplegia - caused by: Nontraumatic intracerebral hemorrhage;Nontraumatic [facility]    Time: 4718-5501 PT Time Calculation (min) (ACUTE ONLY): 37 min   Charges:   PT Evaluation $PT Eval Moderate Complexity: 1 Mod          Karma Ganja, PT, DPT   Acute Rehabilitation Department Pager #: [phone]  Otho Bellows 02/24/2021, 5:09 PM

## 2021-02-24 NOTE — Evaluation (Signed)
Occupational Therapy Evaluation Patient Details Name: Tonya [name] MRN: [id number] DOB: 1945-12-25 Today's Date: 02/24/2021    History of Present Illness The pt is a 75 yo female presenting 6/18 with c/o R-sided headache. 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 PMH includes: lung cancer s/p R upper lobectomy, currently undergoing chemo, GERD, and HLD.   Clinical Impression   Pt PTA: Pt living with spouse and reports independence prior, but does wear glasses at all times. Pt currently, limited by decreased strength, L inattention, decreased mobility and decreased ability to care for self. Pt reports that she wears glasses, but does not have them here with her. totalA of 1 with pt attempting to hold therapist with RUE. pivot to [facility]; totalA+2 for sit to stand from [facility] and for pericare in standing BP from SBP of 130s to 160s and then 170s while pt sitting on BSC. pt then returned to supine in bed in attempt to decrease SBP. Pt would greatly benefit from continued OT skilled services. OT following acutely.    Follow Up Recommendations  CIR    Equipment Recommendations  Other (comment) (defer to post acute care setting)    Recommendations for Other Services Rehab consult     Precautions / Restrictions Precautions Precautions: Fall Precaution Comments: L inattention Restrictions Weight Bearing Restrictions: No      Mobility Bed Mobility Overal bed mobility: Needs Assistance Bed Mobility: Supine to Sit;Sit to Supine     Supine to sit: Mod assist;+2 for physical assistance;HOB elevated Sit to supine: Max assist;+2 for physical assistance;HOB elevated   General bed mobility comments: totalA for LUE d/t inattention. modA to pull trunk to sit. maxA to reutn pt to supine    Transfers Overall transfer level: Needs assistance Equipment used: 1 person hand held assist Transfers: Sit to/from [facility] Sit to Stand: Total assist Stand pivot  transfers: Total assist       General transfer comment: totalA of 1 with pt attempting to hold therapist with RUE. pivot to [facility]; totalA+2 for sit to stand from [facility] and for pericare in standing    Balance Overall balance assessment: Needs assistance Sitting-balance support: Single extremity supported;Feet supported Sitting balance-Leahy Scale: Poor Sitting balance - Comments: reliant on single UE and min-modA Postural control: Left lateral lean;Posterior lean Standing balance support: Single extremity supported;During functional activity Standing balance-Leahy Scale: Zero Standing balance comment: dependent on therapist to maintain upright                           ADL either performed or assessed with clinical judgement   ADL Overall ADL's : Needs assistance/impaired Eating/Feeding: NPO   Grooming: Minimal assistance;Bed level Grooming Details (indicate cue type and reason): with RUE Upper Body Bathing: Moderate assistance   Lower Body Bathing: Maximal assistance   Upper Body Dressing : Moderate assistance   Lower Body Dressing: Bed level;Maximal assistance Lower Body Dressing Details (indicate cue type and reason): figure 4 for RLE Toilet Transfer: Maximal assistance;+2 for physical assistance;+2 for safety/equipment;Squat-pivot;BSC;Total assistance Toilet Transfer Details (indicate cue type and reason): totalA +1; maxA+2 Toileting- Clothing Manipulation and Hygiene: Total assistance Toileting - Clothing Manipulation Details (indicate cue type and reason): BM, able to "stand" for 30 secs     Functional mobility during ADLs: Maximal assistance;+2 for physical assistance;+2 for safety/equipment;Total assistance;Cueing for safety;Cueing for sequencing General ADL Comments: Pt limited by decreased strength, L inattention, decreased mobility and decreased ability to care  for self. Pt reports that she wears glasses, but does not have them here with her.     Vision  Baseline Vision/History: Wears glasses Wears Glasses: At all times Patient Visual Report: Other (comment) (does not have glasses with her) Vision Assessment?: Yes Eye Alignment: Impaired (comment) Ocular Range of Motion: Impaired-to be further tested in functional context;Restricted on the left Alignment/Gaze Preference: Head turned;Head tilt;Gaze right Tracking/Visual Pursuits: Impaired - to be further tested in functional context Additional Comments: continue to assess     Perception     Praxis      Pertinent Vitals/Pain Pain Assessment: Faces Faces Pain Scale: No hurt Pain Intervention(s): Monitored during session     Hand Dominance Right   Extremity/Trunk Assessment Upper Extremity Assessment Upper Extremity Assessment: Generalized weakness;LUE deficits/detail;RUE deficits/detail RUE Deficits / Details: 2-/5 strength, AROM shoulder through digits, WFLs; poor grip strength RUE Coordination: decreased fine motor;decreased gross motor LUE Deficits / Details: 0/5 strength, no AROM noted LUE Sensation: decreased light touch LUE Coordination: decreased fine motor;decreased gross motor   Lower Extremity Assessment Lower Extremity Assessment: Defer to PT evaluation RLE Deficits / Details: Pt able to move against gravity, difficulty coordinating movements and grading force needed RLE Coordination: decreased fine motor;decreased gross motor LLE Deficits / Details: pt with inattention to LLE, able to move with direct multimodal cues. grossly 3-/5 LLE Sensation: decreased light touch (pt reports LLE "feels different" then stated it felt "cool") LLE Coordination: decreased fine motor;decreased gross motor   Cervical / Trunk Assessment Cervical / Trunk Assessment: Kyphotic   Communication Communication Communication: No difficulties   Cognition Arousal/Alertness: Awake/alert Behavior During Therapy: WFL for tasks assessed/performed;Flat affect Overall Cognitive Status:  Impaired/Different from baseline Area of Impairment: Attention;Following commands;Awareness;Problem solving;Safety/judgement                   Current Attention Level: Focused   Following Commands: Follows one step commands consistently;Follows one step commands with increased time Safety/Judgement: Decreased awareness of safety;Decreased awareness of deficits   Problem Solving: Slow processing;Decreased initiation;Requires verbal cues;Difficulty sequencing General Comments: unaware of L side inattention; following most commands on R side, initiating movements with multimodal cues on L side   General Comments  BP from SBP of 130s to 160s and then 170s while pt sitting on BSC. pt then returned to supine in bed in attempt to decrease SBP    Exercises     Shoulder Instructions      Home Living Family/patient expects to be discharged to:: Private residence Living Arrangements: Spouse/significant other Available Help at Discharge: Family;Available 24 hours/day Type of Home: House Home Access: Level entry     Home Layout: One level     Bathroom Shower/Tub: Occupational psychologist: Standard     Home Equipment: Grab bars - tub/shower;Shower seat;Hand held shower head          Prior Functioning/Environment Level of Independence: Independent        Comments: driving, retired        OT Problem List: Decreased strength;Decreased range of motion;Decreased activity tolerance;Impaired balance (sitting and/or standing);Impaired vision/perception;Decreased coordination;Decreased cognition;Decreased safety awareness;Pain;Impaired UE functional use;Increased edema      OT Treatment/Interventions: Self-care/ADL training;Therapeutic exercise;Neuromuscular education;Energy conservation;DME and/or AE instruction;Therapeutic activities;Cognitive remediation/compensation;Visual/perceptual remediation/compensation;Patient/family education;Balance training    OT  Goals(Current goals can be found in the care plan section) Acute Rehab OT Goals Patient Stated Goal: to return home, be able to travel OT Goal Formulation: Patient unable to participate in goal setting Time  For Goal Achievement: 03/10/21 Potential to Achieve Goals: Good ADL Goals Pt Will Perform Grooming: with set-up;sitting Pt Will Transfer to Toilet: with mod assist;squat pivot transfer;bedside commode Pt/caregiver will Perform Home Exercise Program: Increased strength;Left upper extremity;With minimal assist;With written HEP provided Additional ADL Goal #1: Pt will increase attention to L side with compensatory visual strategies with 50% accuracy in 3/5 trials. Additional ADL Goal #2: Pt will locate 3 ADL items on L side of table with minimal cues for compensatory strategies.  OT Frequency: Min 2X/week   Barriers to D/C:            Co-evaluation PT/OT/SLP Co-Evaluation/Treatment: Yes Reason for Co-Treatment: For patient/therapist safety;Complexity of the patient's impairments (multi-system involvement);Necessary to address cognition/behavior during functional activity   OT goals addressed during session: Strengthening/ROM;ADL's and self-care      AM-PAC OT "6 Clicks" Daily Activity     Outcome Measure Help from another person eating meals?: Total Help from another person taking care of personal grooming?: A Lot Help from another person toileting, which includes using toliet, bedpan, or urinal?: Total Help from another person bathing (including washing, rinsing, drying)?: A Lot Help from another person to put on and taking off regular upper body clothing?: A Little Help from another person to put on and taking off regular lower body clothing?: A Lot 6 Click Score: 11   End of Session Equipment Utilized During Treatment: Gait belt Nurse Communication: Mobility status;Other (comment)  Activity Tolerance: Patient limited by fatigue;Patient limited by lethargy;Treatment limited  secondary to medical complications (Comment) Patient left: in bed;with call bell/phone within reach;with bed alarm set  OT Visit Diagnosis: Unsteadiness on feet (R26.81);Muscle weakness (generalized) (M62.81);Other symptoms and signs involving cognitive function;Low vision, both eyes (H54.2)                Time: 2595-6387 OT Time Calculation (min): 39 min Charges:  OT General Charges $OT Visit: 1 Visit OT Evaluation $OT Eval Moderate Complexity: 1 Mod  Jefferey Pica, OTR/L Acute Rehabilitation Services Pager: [phone] Office: [place] 02/24/2021, 8:16 PM

## 2021-02-24 NOTE — Progress Notes (Signed)
LTM discontinued; no skin breakdown was seen. 

## 2021-02-24 NOTE — Evaluation (Signed)
Speech Language Pathology Evaluation Patient Details Name: CHRISTEE MERVINE MRN: [id number] DOB: 31-May-1946 Today's Date: 02/24/2021 Time: 3875-6433 SLP Time Calculation (min) (ACUTE ONLY): 18 min  Problem List:  Patient Active Problem List   Diagnosis Date Noted   Stroke, 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[place]) 02/23/2021   Intracranial hemorrhage ([place]) 02/22/2021   Chemotherapy induced neutropenia ([place]) 01/08/2021   Bradycardia 01/01/2021   Hypomagnesemia 12/10/2020   Nausea without vomiting 12/10/2020   Status post thoracentesis    Pleural effusion, right 11/01/2020   Adenocarcinoma of right lung, stage 2 ([place]) 09/11/2020   Encounter for antineoplastic chemotherapy 09/11/2020   S/P Robotic Assited Video Thoracoscopy with Right Upper Lobectomy Lung 08/26/2020   Mediastinal adenopathy 07/18/2020   Lung nodules    Lung nodule 06/14/2020   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   Tobacco dependence 05/29/2020   Exertional chest pain 05/28/2020   Numbness and tingling of left arm and leg 07/01/2012   Hyperlipidemia LDL goal < 100 07/01/2012   Chest pressure 06/30/2012   GERD (gastroesophageal reflux disease) 06/30/2012   Past Medical History:  Past Medical History:  Diagnosis Date   Adenoma of left adrenal gland    Cancer ([place])    Lung   Colon polyps    hyperplastic   Complication of anesthesia    Very emotional and cries after anesthesia   GERD (gastroesophageal reflux disease)    Kidney stone    Liver lesion    Microhematuria    Past Surgical History:  Past Surgical History:  Procedure Laterality Date   ABDOMINAL HYSTERECTOMY     APPENDECTOMY     BRONCHIAL BIOPSY  07/18/2020   Procedure: BRONCHIAL BIOPSIES;  Surgeon: [name], DO;  Location: [place];  Service: Pulmonary;;   BRONCHIAL BRUSHINGS  07/18/2020   Procedure: BRONCHIAL BRUSHINGS;  Surgeon: [name], DO;  Location: [place];  Service: Pulmonary;;   BRONCHIAL NEEDLE ASPIRATION BIOPSY  07/18/2020    Procedure: BRONCHIAL NEEDLE ASPIRATION BIOPSIES;  Surgeon: [name], DO;  Location: [place] [facility];  Service: Pulmonary;;   BRONCHIAL WASHINGS  07/18/2020   Procedure: BRONCHIAL WASHINGS;  Surgeon: [name], DO;  Location: [place] [facility];  Service: Pulmonary;;   FIDUCIAL MARKER PLACEMENT  07/18/2020   Procedure: FIDUCIAL MARKER PLACEMENT;  Surgeon: [name], DO;  Location: [place] [facility];  Service: Pulmonary;;   INTERCOSTAL NERVE BLOCK Right 08/26/2020   Procedure: INTERCOSTAL NERVE BLOCK;  Surgeon: [name], MD;  Location: [place];  Service: Thoracic;  Laterality: Right;   IR THORACENTESIS ASP PLEURAL SPACE W/IMG GUIDE  10/10/2020   NODE DISSECTION Right 08/26/2020   Procedure: NODE DISSECTION;  Surgeon: [name], MD;  Location: [place];  Service: Thoracic;  Laterality: Right;   THORACENTESIS Right 11/04/2020   Procedure: Mathews Robinsons;  Surgeon: [name], DO;  Location: [place] [facility];  Service: Pulmonary;  Laterality: Right;   THORACENTESIS Right 11/29/2020   Procedure: THORACENTESIS;  Surgeon: [name], DO;  Location: [facility];  Service: Pulmonary;  Laterality: Right;   VIDEO BRONCHOSCOPY WITH ENDOBRONCHIAL NAVIGATION N/A 07/18/2020   Procedure: VIDEO BRONCHOSCOPY WITH ENDOBRONCHIAL NAVIGATION;  Surgeon: [name], DO;  Location: [place];  Service: Pulmonary;  Laterality: N/A;   VIDEO BRONCHOSCOPY WITH ENDOBRONCHIAL ULTRASOUND  07/18/2020   Procedure: VIDEO BRONCHOSCOPY WITH ENDOBRONCHIAL ULTRASOUND;  Surgeon: [name], DO;  Location: [facility];  Service: Pulmonary;;   HPI:  75 yr old admitted with headache, confusion. Found to have CT head shows a large intraparenchymal hemorrhage  within the R parietal lobe as well as some R frontal subarachnoid hemorrhage. PMH: lung cancer s/p R upper lobectomy, currently undergoing chemotherapy , GERD, kidney stone, adenoma of left adrenal gland   Assessment / Plan /  Recommendation Clinical Impression  Pt demonstrated significant cognitive impairments as seen by score of 73/30 on SLUMS. Her left neglect is severe and she struggles with pen paper tasks and reading. Exhibited difficulty retrieving words but accurate with semantic cue for 3/5. Oriented to person, place not time. She performed well on mental calculations/math. Her awareness of impairments is decreased. ST will continue ST and focus on cognitive independence during ADL's.    SLP Assessment  SLP Recommendation/Assessment: Patient needs continued Speech Lanaguage Pathology Services SLP Visit Diagnosis: Cognitive communication deficit (R41.841)    Follow Up Recommendations  Inpatient Rehab    Frequency and Duration min 2x/week  2 weeks      SLP Evaluation Cognition  Overall Cognitive Status: Impaired/Different from baseline Arousal/Alertness: Awake/alert (drowsy at times) Orientation Level: Oriented to person;Oriented to place;Disoriented to situation;Disoriented to time Attention: Sustained Sustained Attention: Impaired Sustained Attention Impairment: Verbal basic;Functional basic Memory: Impaired Memory Impairment: Storage deficit;Retrieval deficit Awareness: Impaired Awareness Impairment: Emergent impairment;Anticipatory impairment;Intellectual impairment Problem Solving: Impaired Problem Solving Impairment: Verbal basic Executive Function: Sequencing;Reasoning;Organizing Behaviors: Other (comment) (stated she was a little depressed) Safety/Judgment: Impaired       Comprehension  Auditory Comprehension Overall Auditory Comprehension: Appears within functional limits for tasks assessed Visual Recognition/Discrimination Discrimination: Not tested Reading Comprehension Reading Status:  (TBA)    Expression Expression Primary Mode of Expression: Verbal Verbal Expression Overall Verbal Expression: Appears within functional limits for tasks assessed Pragmatics:  Impairment Impairments: Eye contact Written Expression Dominant Hand: Right Written Expression:  (TBA)   Oral / Motor  Oral Motor/Sensory Function Overall Oral Motor/Sensory Function: Within functional limits Motor Speech Overall Motor Speech: Appears within functional limits for tasks assessed Intelligibility: Intelligible Motor Planning: Witnin functional limits   GO                    [name] 02/24/2021, 4:48 PM Orbie Pyo Essa Malachi M.Ed Risk analyst [phone] Office [phone]

## 2021-02-24 NOTE — Progress Notes (Signed)
NAME:  Tonya [name], MRN:  [id number], DOB:  [birth year], LOS: 2 ADMISSION DATE:  02/22/2021, CONSULTATION DATE:  02/24/21 REFERRING MD:  Neuro, CHIEF COMPLAINT:  brain bleed   History of Present Illness:  Patient intubated and sedated. Can not obtain history per patient. History per EMR as follows: Headache mid morning. More confused through afternoon. Went t [facility] via EMS. Teleneurology consult revealed "patient is stuporous, nonverbal, not following most commands, with R gaze deviation, and generalized but L>R weakness. CT head shows a large intraparenchymal hemorrhage within the R parietal lobe as well as some R frontal subarachnoid hemorrhage. The cause for this hemorrhage is unclear, as patient has no known history of brain mets and she was not significantly hypertensive on arrival. Patient is quite tremulous/twitchy, raising some concern for seizure as well."  CT head revealed Intraparenchymal hematoma centered in the high right parietal lobe measuring approximately 4.0 x 2.5 x 4.6 cm with subarachnoid extension. No mid line shift or mass effect.  Pertinent  Medical History  Stage 2 NSCLC on chemo  Significant Hospital Events: Including procedures, antibiotic start and stop dates in addition to other pertinent events   6/19 admitted with brain bleed, intubated  Interim History / Subjective:  Tmax 100.1. Still on propofol, cEEG. Tolerating PS 10 this morning. Husband at bedside.  Objective   Blood pressure 130/85, pulse 86, temperature 99.9 F (37.7 C), temperature source Axillary, resp. rate 16, height 5\' 4"  (1.626 m), weight 62.4 kg, SpO2 100 %.    Vent Mode: PRVC FiO2 (%):  [40 %] 40 % Set Rate:  [16 bmp] 16 bmp Vt Set:  [470 mL] 470 mL PEEP:  [5 cmH20] 5 cmH20 Plateau Pressure:  [15 cmH20-18 cmH20] 15 cmH20   Intake/Output Summary (Last 24 hours) at 02/24/2021 0809 Last data filed at 02/24/2021 0600 Gross per 24 hour  Intake 2672 ml  Output 2400 ml  Net 272 ml     Filed Weights   02/22/21 2026 02/23/21 0125  Weight: 62 kg 62.4 kg    Examination: General: critically ill appearing woman lying in bed in NAD HENT: [place]/AT, ETT in place. Lungs: breathing comfortably on PS 8, CTAB Cardiovascular:  RRR, no murmur Abdomen: soft, NT Extremities: no c/c/e Derm: warm, dry, no rashes Neuro: moved around in bed spontaneously, but RASS -5   Labs/imaging that I havepersonally reviewed  (right click and "Reselect all SmartList Selections" daily)  MRV: no dural venous sinus thrombosis MRA head: stable R parietal hemorrhage w/o AVM or underlying mass causing bleed. Small volume blood in ventricles. MRA neck: mild tortuosity of cervical ICAs likely due to chronic HTN K+ 3.0 BUN 8 Cr 0.75   Resolved Hospital Problem list   N/a  Assessment & Plan:  Intraparenchymal hemorrhage, no associated underlying AVM, mass, or venous sinus thrombosis. IPH stable on follow up imaging. --Con't supportive care --con't strict BP control with SBP <140 first 24 hours, then <160. Has IV drips and pushes. --Appreciate Neurosurgery's assistance. --SBP < 140, cleviprex low dose, consider oral medication to help wean --Con't frequent neuro exams --prophylactic keppra  R Arm tremor: concern for seizure --continuous EEG --ativan PRN --continue ppx keppra --appreciate neurology's assistance  Stage IIB NSCLC, on chemo. Concerns for stage IV disease with contralateral nodules on scan per Onc notes. Last chemo Jan 17, 2021, most recent restaging scans on 02/04/21 showed response to treatment. Previous tobacco abuse- quit 2021 -not a candidate to start her new immunotherapy regimen at this time  Acute hypoxemic respiratory failure requiring MV due to encephalopathy from IPH COPD --con't LTVV, 4-8cc/kg IBW with goal Pplat<30 and DP<15 --VAP prevention protocol --PAD protocol for sedation; goal RASS 0 to -1 with seizure control --Stress ulcer ppx --on LABA/LAMA PTA;  duonebs while admitted  Toxic metabolic encephalopathy: Due to brian bleed --propofol, prn fentanyl, goal RASS 0 to -1  Hypokalemia -repleted -con't to monitor   Best Practice (right click and "Reselect all SmartList Selections" daily)   Diet/type: NPO w/ meds via tube Pain/Anxiety/Delirium protocol RASS goal: 0 to -1 VAP protocol (if indicated): Yes DVT prophylaxis: SCD-  brain bleed GI prophylaxis: PPI Glucose control:  not indicated Central venous access:  N/A Arterial line:  N/A Foley:  Yes, and it is still needed Mobility:  bed rest  PT consulted: N/A Studies pending: other EEG Culture data pending:none Last reviewed culture data:today Antibiotics:not indicated  Antibiotic de-escalation: N/A Stop date: N/A Daily labs: requested Code Status:  full code Last date of multidisciplinary goals of care discussion [Husband updated at bedside on 6/20] Disposition: remains critically ill, will stay in intensive care        Labs   CBC: Recent Labs  Lab 02/22/21 2100 02/22/21 2120  WBC 10.7*  --   NEUTROABS 9.6*  --   HGB 11.0* 11.6*  HCT 32.8* 34.0*  MCV 97.9  --   PLT 237  --      Basic Metabolic Panel: Recent Labs  Lab 02/22/21 2100 02/22/21 2120 02/23/21 0344 02/23/21 0852  NA 139 139  --  141  K 3.1* 3.3*  --  3.0*  CL 100 101  --  106  CO2 25  --   --  24  GLUCOSE 150* 151*  --  132*  BUN 10 8  --  8  CREATININE 0.89 0.70  --  0.75  CALCIUM 9.6  --   --  8.9  MG  --   --  1.5*  --   PHOS  --   --  2.8  --     GFR: Estimated Creatinine Clearance: 54.9 mL/min (by C-G formula based on SCr of 0.75 mg/dL). Recent Labs  Lab 02/22/21 2100  WBC 10.7*     This patient is critically ill with multiple organ system failure which requires frequent high complexity decision making, assessment, support, evaluation, and titration of therapies. This was completed through the application of advanced monitoring technologies and extensive interpretation of  multiple databases. During this encounter critical care time was devoted to patient care services described in this note for 35 minutes.   Julian Hy, DO 02/24/21 8:09 AM [facility]

## 2021-02-24 NOTE — Procedures (Addendum)
Patient Name: Tonya [name]  MRN: [id number]  Epilepsy Attending: Lora Havens  Referring Physician/Provider: Dr Lynnae Sandhoff Duration : 02/23/2021 1137 to 02/24/2021 1023  Patient history: 75yo F with right parietal IPH with small frontal subarachnoid hemorrhage, noted to have right upper extremity shaking. EEG to evaluate for  seizure  Level of alertness: comatose  AEDs during EEG study: LEV, Propofol  Technical aspects: This EEG study was done with scalp electrodes positioned according to the 10-20 International system of electrode placement. Electrical activity was acquired at a sampling rate of 500Hz  and reviewed with a high frequency filter of 70Hz  and a low frequency filter of 1Hz . EEG data were recorded continuously and digitally stored.   Description: EEG showed continuous generalized and lateralized right hemisphere polymorphic sharply contoured 3 to 6 Hz theta-delta slowing admixed with generalized 15 to 18 Hz beta activity. Sharp transients are also noted in right frontal region.  Hyperventilation and photic stimulation were not performed.     ABNORMALITY - Continuous slow, generalized and lateralized right hemisphere - Excessive beta, generalized  IMPRESSION: This study is suggestive of cortical dysfunction in right hemisphere likely secondary to underlying stroke.  Additionally there is severe diffuse encephalopathy, nonspecific etiology but likely related to sedation. No seizures or definite epileptiform discharges were seen throughout the recording.   Kailan Laws Barbra Sarks

## 2021-02-24 NOTE — TOC CAGE-AID Note (Signed)
Transition of Care [facility]) - CAGE-AID Screening   Patient Details  Name: Tonya [name] MRN: [id number] Date of Birth: [birth year]  [facility] of [facility] CM/SW Contact:    Benard Halsted, LCSW Phone Number: 02/24/2021, 1:47 PM   Clinical Narrative: Patient unable to participate at this time.    CAGE-AID Screening: Substance Abuse Screening unable to be completed due to: : Patient unable to participate

## 2021-02-24 NOTE — Progress Notes (Signed)
Clinical/Bedside Swallow Evaluation Patient Details  Name: Tonya [name] MRN: [id number] Date of Birth: [birth year]  Today's Date: 02/24/2021 Time: SLP Start Time (ACUTE ONLY): 1522 SLP Stop Time (ACUTE ONLY): 5638 SLP Time Calculation (min) (ACUTE ONLY): 18 min  Past Medical History:  Past Medical History:  Diagnosis Date   Adenoma of left adrenal gland    Cancer ([place])    Lung   Colon polyps    hyperplastic   Complication of anesthesia    Very emotional and cries after anesthesia   GERD (gastroesophageal reflux disease)    Kidney stone    Liver lesion    Microhematuria    Past Surgical History:  Past Surgical History:  Procedure Laterality Date   ABDOMINAL HYSTERECTOMY     APPENDECTOMY     BRONCHIAL BIOPSY  07/18/2020   Procedure: BRONCHIAL BIOPSIES;  Surgeon: [name], DO;  Location: [place];  Service: Pulmonary;;   BRONCHIAL BRUSHINGS  07/18/2020   Procedure: BRONCHIAL BRUSHINGS;  Surgeon: [name], DO;  Location: [place];  Service: Pulmonary;;   BRONCHIAL NEEDLE ASPIRATION BIOPSY  07/18/2020   Procedure: BRONCHIAL NEEDLE ASPIRATION BIOPSIES;  Surgeon: [name], DO;  Location: [place] [facility];  Service: Pulmonary;;   BRONCHIAL WASHINGS  07/18/2020   Procedure: BRONCHIAL WASHINGS;  Surgeon: [name], DO;  Location: [place] [facility];  Service: Pulmonary;;   FIDUCIAL MARKER PLACEMENT  07/18/2020   Procedure: FIDUCIAL MARKER PLACEMENT;  Surgeon: [name], DO;  Location: [place] [facility];  Service: Pulmonary;;   INTERCOSTAL NERVE BLOCK Right 08/26/2020   Procedure: INTERCOSTAL NERVE BLOCK;  Surgeon: [name], MD;  Location: [place];  Service: Thoracic;  Laterality: Right;   IR THORACENTESIS ASP PLEURAL SPACE W/IMG GUIDE  10/10/2020   NODE DISSECTION Right 08/26/2020   Procedure: NODE DISSECTION;  Surgeon: [name], MD;  Location: [place];  Service: Thoracic;  Laterality: Right;   THORACENTESIS Right 11/04/2020    Procedure: Mathews Robinsons;  Surgeon: [name], DO;  Location: [place] [facility];  Service: Pulmonary;  Laterality: Right;   THORACENTESIS Right 11/29/2020   Procedure: THORACENTESIS;  Surgeon: [name], DO;  Location[facility];  Service: Pulmonary;  Laterality: Right;   VIDEO BRONCHOSCOPY WITH ENDOBRONCHIAL NAVIGATION N/A 07/18/2020   Procedure: VIDEO BRONCHOSCOPY WITH ENDOBRONCHIAL NAVIGATION;  Surgeon: [name], DO;  Location: [place];  Service: Pulmonary;  Laterality: N/A;   VIDEO BRONCHOSCOPY WITH ENDOBRONCHIAL ULTRASOUND  07/18/2020   Procedure: VIDEO BRONCHOSCOPY WITH ENDOBRONCHIAL ULTRASOUND;  Surgeon: [name], DO;  Location: [facility];  Service: Pulmonary;;   HPI:  75 yr old admitted with headache, confusion. Found to have CT head shows a large intraparenchymal hemorrhage within the R parietal lobe as well as some R frontal subarachnoid hemorrhage. PMH: lung cancer s/p R upper lobectomy, currently undergoing chemotherapy , GERD, kidney stone, adenoma of left adrenal gland   Assessment / Plan / Recommendation Clinical Impression  Therapist suspects a primary esophageal dysphagia due to frequent eructation, globus sensation (chest) and multiple swallows. Labial/lingual ROM, strength were adequate and pt denies sensory differences. In addition, immediate and delayed throat clears followed 2 oz water (unable to consecutively consume 3). Her dentures donned however did not fit without adhesive she uses at baseline. Given neurological component on chronic GERD and clincial observations, recommend instrumentation with MBS tomorrow. Pt allowed meds crushed in puree, sips thin water and ice chips until MBS SLP Visit Diagnosis: Dysphagia, unspecified (R13.10)    Aspiration Risk  Mild aspiration risk;Moderate aspiration risk    Diet Recommendation NPO except meds;Ice chips PRN after oral care;Other (Comment) (small sips water only)        Other   Recommendations Oral Care Recommendations: Oral care QID   Follow up Recommendations Inpatient Rehab      Frequency and Duration            Prognosis Prognosis for Safe Diet Advancement: Good      Swallow Study   General Date of Onset: 02/23/21 HPI: 75 yr old admitted with headache, confusion. Found to have CT head shows a large intraparenchymal hemorrhage within the R parietal lobe as well as some R frontal subarachnoid hemorrhage. PMH: lung cancer s/p R upper lobectomy, currently undergoing chemotherapy , GERD, kidney stone, adenoma of left adrenal gland Type of Study: Bedside Swallow Evaluation Previous Swallow Assessment:  (none) Diet Prior to this Study: NPO Temperature Spikes Noted: No Respiratory Status: Nasal cannula History of Recent Intubation: No Behavior/Cognition: Alert;Cooperative;Pleasant mood;Requires cueing Oral Cavity Assessment: Dry Oral Care Completed by SLP: Yes Oral Cavity - Dentition: Edentulous (dentures do not fit without adhesive) Vision: Impaired for self-feeding Self-Feeding Abilities: Needs assist Patient Positioning: Upright in bed Baseline Vocal Quality: Normal Volitional Cough: Strong Volitional Swallow: Able to elicit    Oral/Motor/Sensory Function Overall Oral Motor/Sensory Function: Within functional limits   Ice Chips Ice chips: Not tested   Thin Liquid Thin Liquid: Impaired Presentation: Cup;Straw Oral Phase Impairments:  (functional) Pharyngeal  Phase Impairments: Multiple swallows;Throat Clearing - Delayed;Throat Clearing - Immediate    Nectar Thick Nectar Thick Liquid: Not tested   Honey Thick Honey Thick Liquid: Not tested   Puree Puree: Impaired Oral Phase Impairments:  (none) Pharyngeal Phase Impairments: Multiple swallows   Solid     Solid: Not tested      [name] 02/24/2021,4:24 PM  [name] Caroli.Ed Risk analyst [phone] Office [phone]

## 2021-02-24 NOTE — Procedures (Signed)
Extubation Procedure Note  Patient Details:   Name: Tonya [name] DOB: June 18, 1946 MRN: [id number]   Airway Documentation:    Vent end date: 02/24/21 Vent end time: 1037   Evaluation  O2 sats: stable throughout Complications: No apparent complications Patient did tolerate procedure well. Bilateral Breath Sounds: Clear, Diminished   Yes 4l/min [place] Incentive spirometer  Revonda Standard 02/24/2021, 10:37 AM

## 2021-02-25 ENCOUNTER — Inpatient Hospital Stay (HOSPITAL_COMMUNITY): Payer: Medicare HMO

## 2021-02-25 ENCOUNTER — Other Ambulatory Visit: Payer: Self-pay

## 2021-02-25 DIAGNOSIS — R414 Neurologic neglect syndrome: Secondary | ICD-10-CM | POA: Diagnosis not present

## 2021-02-25 DIAGNOSIS — I10 Essential (primary) hypertension: Secondary | ICD-10-CM | POA: Diagnosis not present

## 2021-02-25 DIAGNOSIS — I629 Nontraumatic intracranial hemorrhage, unspecified: Secondary | ICD-10-CM | POA: Diagnosis not present

## 2021-02-25 DIAGNOSIS — R1310 Dysphagia, unspecified: Secondary | ICD-10-CM

## 2021-02-25 LAB — MAGNESIUM: Magnesium: 1.4 mg/dL — ABNORMAL LOW (ref 1.7–2.4)

## 2021-02-25 LAB — GLUCOSE, CAPILLARY
Glucose-Capillary: 98 mg/dL (ref 70–99)
Glucose-Capillary: 104 mg/dL — ABNORMAL HIGH (ref 70–99)
Glucose-Capillary: 90 mg/dL (ref 70–99)
Glucose-Capillary: 109 mg/dL — ABNORMAL HIGH (ref 70–99)
Glucose-Capillary: 101 mg/dL — ABNORMAL HIGH (ref 70–99)

## 2021-02-25 LAB — BASIC METABOLIC PANEL
Anion gap: 17 — ABNORMAL HIGH (ref 5–15)
BUN: 10 mg/dL (ref 8–23)
CO2: 18 mmol/L — ABNORMAL LOW (ref 22–32)
Calcium: 8.8 mg/dL — ABNORMAL LOW (ref 8.9–10.3)
Chloride: 106 mmol/L (ref 98–111)
Creatinine, Ser: 0.66 mg/dL (ref 0.44–1.00)
GFR, Estimated: >60 mL/min (ref >60)
Glucose, Bld: 103 mg/dL — ABNORMAL HIGH (ref 70–99)
Potassium: 3.4 mmol/L — ABNORMAL LOW (ref 3.5–5.1)
Sodium: 141 mmol/L (ref 135–145)

## 2021-02-25 MED ORDER — INSULIN ASPART 100 UNIT/ML IJ SOLN
1.0000 [IU] | INTRAMUSCULAR | Status: DC
Start: 2021-02-25 — End: 2021-02-27

## 2021-02-25 MED ORDER — MAGNESIUM SULFATE 2 GM/50ML IV SOLN
2.0000 g | Freq: Once | INTRAVENOUS | Status: AC
  Administered 2021-02-25: 2 g via INTRAVENOUS
  Filled 2021-02-25: qty 50

## 2021-02-25 MED ORDER — POTASSIUM CHLORIDE 20 MEQ PO PACK
40.0000 meq | PACK | Freq: Once | ORAL | Status: AC
  Administered 2021-02-25: 40 meq via ORAL
  Filled 2021-02-25: qty 2

## 2021-02-25 MED ORDER — UMECLIDINIUM-VILANTEROL 62.5-25 MCG/INH IN AEPB
1.0000 | INHALATION_SPRAY | Freq: Every day | RESPIRATORY_TRACT | Status: DC
  Administered 2021-02-25 – 2021-02-26 (×2): 1 via RESPIRATORY_TRACT
  Filled 2021-02-25: qty 14

## 2021-02-25 MED ORDER — LISINOPRIL 5 MG PO TABS
5.0000 mg | ORAL_TABLET | Freq: Every day | ORAL | Status: DC
  Administered 2021-02-25: 5 mg via ORAL
  Filled 2021-02-25: qty 1

## 2021-02-25 MED ORDER — SENNOSIDES-DOCUSATE SODIUM 8.6-50 MG PO TABS
1.0000 | ORAL_TABLET | Freq: Two times a day (BID) | ORAL | Status: DC
  Administered 2021-02-25 – 2021-02-27 (×4): 1 via ORAL
  Filled 2021-02-25 (×4): qty 1

## 2021-02-25 MED ORDER — CYCLOBENZAPRINE HCL 10 MG PO TABS: 5.0000 mg | ORAL_TABLET | Freq: Three times a day (TID) | ORAL | Status: DC | PRN

## 2021-02-25 MED ORDER — IPRATROPIUM-ALBUTEROL 0.5-2.5 (3) MG/3ML IN SOLN: 3.0000 mL | Freq: Four times a day (QID) | RESPIRATORY_TRACT | Status: DC | PRN

## 2021-02-25 NOTE — Progress Notes (Signed)
Inpatient Rehab Admissions Coordinator Note:   Per therapy recommendations, pt was screened for CIR candidacy by Shann Medal, PT, DPT.  At this time we are recommending a CIR consult and I will place an order per our protocol.  Please contact me with questions.   Shann Medal, PT, DPT [phone] 02/25/21 10:06 AM

## 2021-02-25 NOTE — Progress Notes (Signed)
Physical Therapy Treatment Patient Details Name: Tonya [name] MRN: [id number] DOB: [birth year] Today's Date: 02/25/2021    History of Present Illness The pt is a 75 yo female presenting 6/18 with c/o R-sided headache. 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 PMH includes: lung cancer s/p R upper lobectomy, currently undergoing chemo, GERD, and HLD.    PT Comments    Pt's bed wet with urine upon PT arrival, agreeable to OOB mobility for clean up and transfer training practice. Pt requiring mod-max assist for bed mobility and transfer to/from [facility], max cuing for sequencing and LUE/LE during mobility. Pt with significant L inattention, when cued verbally and tactilely to LUE/LE pt able to perform AAROM well. PT to continue to follow acutely.    Follow Up Recommendations  CIR     Equipment Recommendations   (defer to post acute)    Recommendations for Other Services Rehab consult     Precautions / Restrictions Precautions Precautions: Fall Precaution Comments: L inattention Restrictions Weight Bearing Restrictions: No    Mobility  Bed Mobility Overal bed mobility: Needs Assistance Bed Mobility: Supine to Sit;Sit to Supine     Supine to sit: Mod assist Sit to supine: Mod assist   General bed mobility comments: mod assist for trunk and LE management, repositioning in supine.    Transfers Overall transfer level: Needs assistance Equipment used: 1 person hand held assist Transfers: Sit to/from [facility] Sit to Stand: Max assist;+2 safety/equipment Stand pivot transfers: Max assist;+2 safety/equipment       General transfer comment: max assist for power up, hip extension to upright via posterior facilitation, LLE translation during pivotal stepspivot to/from [facility]. STS x2.  Ambulation/Gait                 Stairs             Wheelchair Mobility    Modified Rankin (Stroke Patients Only) Modified Rankin (Stroke  Patients Only) Pre-Morbid Rankin Score: No symptoms Modified Rankin: Severe disability     Balance Overall balance assessment: Needs assistance Sitting-balance support: Single extremity supported;Feet supported Sitting balance-Leahy Scale: Fair Sitting balance - Comments: able to sit EOB without PT truncal assist Postural control: Posterior lean Standing balance support: Single extremity supported;During functional activity Standing balance-Leahy Scale: Zero Standing balance comment: dependent on therapist to maintain upright                            Cognition Arousal/Alertness: Awake/alert Behavior During Therapy: WFL for tasks assessed/performed Overall Cognitive Status: Impaired/Different from baseline Area of Impairment: Attention;Following commands;Awareness;Problem solving;Safety/judgement                   Current Attention Level: Focused   Following Commands: Follows one step commands with increased time Safety/Judgement: Decreased awareness of safety;Decreased awareness of deficits Awareness: Emergent Problem Solving: Slow processing;Decreased initiation;Requires verbal cues;Difficulty sequencing General Comments: pt A&Ox4, follows all commands some with increased time. Inattentive to L, follows cues for LUE/LLE when looking at left side      Exercises General Exercises - Upper Extremity Elbow Flexion: AAROM;Left;10 reps;Supine Elbow Extension: AAROM;Left;10 reps;Supine (cuing pt to look at LUE when flex/ext)    General Comments General comments (skin integrity, edema, etc.): vss      Pertinent Vitals/Pain Pain Assessment: Faces Faces Pain Scale: No hurt Pain Intervention(s): Monitored during session    Home Living  Prior Function            PT Goals (current goals can now be found in the care plan section) Acute Rehab PT Goals Patient Stated Goal: to return home, be able to travel PT Goal Formulation:  With patient Time For Goal Achievement: 03/10/21 Potential to Achieve Goals: Good Progress towards PT goals: Progressing toward goals    Frequency    Min 4X/week      PT Plan Current plan remains appropriate    Co-evaluation              AM-PAC PT "6 Clicks" Mobility   Outcome Measure  Help needed turning from your back to your side while in a flat bed without using bedrails?: A Lot Help needed moving from lying on your back to sitting on the side of a flat bed without using bedrails?: A Lot Help needed moving to and from a bed to a chair (including a wheelchair)?: Total Help needed standing up from a chair using your arms (e.g., wheelchair or bedside chair)?: Total Help needed to walk in hospital room?: Total Help needed climbing 3-5 steps with a railing? : Total 6 Click Score: 8    End of Session Equipment Utilized During Treatment: Gait belt Activity Tolerance: Patient tolerated treatment well Patient left: in bed;with bed alarm set;with call bell/phone within reach;with family/visitor present;with nursing/sitter in room Nurse Communication: Mobility status PT Visit Diagnosis: Other abnormalities of gait and mobility (R26.89);Hemiplegia and hemiparesis Hemiplegia - Right/Left: Left Hemiplegia - dominant/non-dominant: Non-dominant Hemiplegia - caused by: Nontraumatic intracerebral hemorrhage;Nontraumatic SAH     Time: 1120-1140 PT Time Calculation (min) (ACUTE ONLY): 20 min  Charges:  $Therapeutic Activity: 8-22 mins                     Stacie Glaze, PT DPT Acute Rehabilitation Services Pager [phone]  Office [phone]   Roxine Caddy E Ruffin Pyo 02/25/2021, 5:17 PM

## 2021-02-25 NOTE — Progress Notes (Signed)
NAME:  Tonya [name], MRN:  [id number], DOB:  [birth year], LOS: 3 ADMISSION DATE:  02/22/2021, CONSULTATION DATE:  02/25/21 REFERRING MD:  Neuro, CHIEF COMPLAINT:  brain bleed   History of Present Illness:  Patient intubated and sedated. Can not obtain history per patient. History per EMR as follows: Headache mid morning. More confused through afternoon. Went t [facility] via EMS. Teleneurology consult revealed "patient is stuporous, nonverbal, not following most commands, with R gaze deviation, and generalized but L>R weakness. CT head shows a large intraparenchymal hemorrhage within the R parietal lobe as well as some R frontal subarachnoid hemorrhage. The cause for this hemorrhage is unclear, as patient has no known history of brain mets and she was not significantly hypertensive on arrival. Patient is quite tremulous/twitchy, raising some concern for seizure as well."  CT head revealed Intraparenchymal hematoma centered in the high right parietal lobe measuring approximately 4.0 x 2.5 x 4.6 cm with subarachnoid extension. No mid line shift or mass effect.  Pertinent  Medical History  Stage 2 NSCLC on chemo  Significant Hospital Events: Including procedures, antibiotic start and stop dates in addition to other pertinent events   6/19 admitted with brain bleed, intubated  Interim History / Subjective:  Extubated yesterday, denies SOB. Denies other complaints.  Objective   Blood pressure 138/70, pulse 82, temperature 99.5 F (37.5 C), temperature source Oral, resp. rate 15, height 5\' 4"  (1.626 m), weight 62.4 kg, SpO2 100 %.    Vent Mode: PSV;CPAP FiO2 (%):  [40 %] 40 % PEEP:  [5 cmH20] 5 cmH20 Pressure Support:  [8 cmH20-10 cmH20] 8 cmH20 Plateau Pressure:  [15 cmH20] 15 cmH20   Intake/Output Summary (Last 24 hours) at 02/25/2021 0711 Last data filed at 02/25/2021 0700 Gross per 24 hour  Intake 3329.9 ml  Output 2400 ml  Net 929.9 ml    Filed Weights   02/22/21 2026  02/23/21 0125  Weight: 62 kg 62.4 kg    Examination: General: elderly woman lying in bed in NAD HENT: [place]/AT, eyes anicteric Lungs: breathing comfortably on [place], CTAB Cardiovascular:  S1S2, reg rate, irreg rhythm Abdomen: soft, ND Extremities: no clubbing or edema Derm: warm, dry, no rashes Neuro: awake but has left side neglect. Moves her R side briskly and is able to move her left arm and leg with more encouragement, but strength 3/5. Not oriented to place.   Labs/imaging that I havepersonally reviewed  (right click and "Reselect all SmartList Selections" daily)   Echo: LVEF 60-65%, moderate LVH, G1DD. Mildly dilated LA. Normal RV function.  BMP pending.  Resolved Hospital Problem list   N/a  Assessment & Plan:  Intraparenchymal hemorrhage, no associated underlying AVM, mass, or venous sinus thrombosis. IPH stable on follow up imaging. --Con't supportive care.  --Con't strict BP; con't cleviprex PRN to maintain SBP <160. Can add enteral agents once either passes swallow study or gets cortrak. --Appreciate Neurosurgery's assistance. --Con't frequent neuro exams --prophylactic keppra  Dysphagia, left-sided neglect, cognitive deficits post-CVA -PT, OT, SLP -MBSS planned for today  Irreg HR, concern for paroxysmal Afib -EKG  Stage IIB NSCLC, on chemo. Concerns for stage IV disease with contralateral nodules on scan per Onc notes. Last chemo Jan 17, 2021, most recent restaging scans on 02/04/21 showed response to treatment. Previous tobacco abuse- quit 2021 -not a candidate to start her new immunotherapy regimen at this time  Acute hypoxemic respiratory failure requiring MV due to encephalopathy from IPH COPD --wean supplemental O2 to maintain SpO2 >90% --  pulmonary hygiene --can resume LAMA/LABA combo inhaler- Anoro while admitted. Can resume PTA Stiolto at discharge.  Hypokalemia -BMP pending  PCCM will sign off. Please call with questions.   Best Practice (right  click and "Reselect all SmartList Selections" daily)   Diet/type: NPO Pain/Anxiety/Delirium protocol Not indicated and RASS goal: 0 to -1 VAP protocol (if indicated): Not indicated DVT prophylaxis: SCD-  brain bleed GI prophylaxis: N/A Glucose control:  SSI Central venous access:  N/A Arterial line:  N/A Foley:  N/A Mobility:  OOB  PT consulted: Yes Studies pending: other BMP, EKG Culture data pending:none Last reviewed culture data:today Antibiotics:not indicated  Antibiotic de-escalation: N/A Stop date: N/A Daily labs: requested Code Status:  full code Last date of multidisciplinary goals of care discussion [Husband updated at bedside on 6/20] Disposition: remains critically ill, will stay in intensive care      Labs   CBC: Recent Labs  Lab 02/22/21 2100 02/22/21 2120  WBC 10.7*  --   NEUTROABS 9.6*  --   HGB 11.0* 11.6*  HCT 32.8* 34.0*  MCV 97.9  --   PLT 237  --      Basic Metabolic Panel: Recent Labs  Lab 02/22/21 2100 02/22/21 2120 02/23/21 0344 02/23/21 0852  NA 139 139  --  141  K 3.1* 3.3*  --  3.0*  CL 100 101  --  106  CO2 25  --   --  24  GLUCOSE 150* 151*  --  132*  BUN 10 8  --  8  CREATININE 0.89 0.70  --  0.75  CALCIUM 9.6  --   --  8.9  MG  --   --  1.5*  --   PHOS  --   --  2.8  --     GFR: Estimated Creatinine Clearance: 54.9 mL/min (by C-G formula based on SCr of 0.75 mg/dL). Recent Labs  Lab 02/22/21 2100  WBC 10.7*     This patient is critically ill with multiple organ system failure which requires frequent high complexity decision making, assessment, support, evaluation, and titration of therapies. This was completed through the application of advanced monitoring technologies and extensive interpretation of multiple databases. During this encounter critical care time was devoted to patient care services described in this note for 33 minutes.   Julian Hy, DO 02/25/21 8:00 AM [facility]

## 2021-02-25 NOTE — Progress Notes (Signed)
Modified Barium Swallow Progress Note  Patient Details  Name: Tonya [name] MRN: [id number] Date of Birth: 17-Dec-1945  Today's Date: 02/25/2021  Modified Barium Swallow completed.  Full report located under Chart Review in the Imaging Section.  Brief recommendations include the following:  Clinical Impression  Pt presents with a mild oral and likely component of esophageal dysphagia.  Oral deficits c/b reduced bolus cohesion, mild oral bolus delay, and mild oral residuals across POs. Second volitional swallows assisted in clearing oral cavity. Pharyngeal phase of the swallow was within functional limits. Airway protection was completly intact without laryngeal penetration or tracheal aspiration. Swallowing efficiency was also intact. Pt noted to have brief barium tablet stasis in mid esophagus with barium retention requiring additional liquid wash to pass to stomach. Esophageal barium reflux also exhibited. MBSS is not a test of the esophagus. If any esophageal clincial symptoms are exhibited, consider further esophageal workup. Recommend regular thin liquid diet. SLP to follow up.   Swallow Evaluation Recommendations       SLP Diet Recommendations: Regular solids;Thin liquid   Liquid Administration via: Cup;Straw   Medication Administration: Whole meds with liquid   Supervision: Patient able to self feed;Intermittent supervision to cue for compensatory strategies   Compensations: Minimize environmental distractions;Small sips/bites;Slow rate   Postural Changes: Remain semi-upright after after feeds/meals (Comment);Seated upright at 90 degrees   Oral Care Recommendations: Oral care BID       Hayden Rasmussen MA, CCC-SLP Acute Rehabilitation Services   02/25/2021,11:11 AM

## 2021-02-25 NOTE — Progress Notes (Addendum)
STROKE TEAM PROGRESS NOTE    Interval History  Patient continues to do well from a respiratory stand point.  She has also shown improvement in her left hemiparesis and left gaze palsy on exam today.  Underwent MBSS today, will initiate Lisinopril today given she passed MBSS and speech recommended regular diet with thin liquids.   Her neurological exam is improving. No family at bedside.    Pertinent Lab Work and Imaging    02/23/21 CT Head WO IV Contrast 1. Intraparenchymal hematoma centered in the high right parietal lobe with subarachnoid extension over the right convexity and into the basal cisterns. 2. No midline shift or other mass effect.  02/23/21 MR Brain W WO Contrast  Stable appearance of right parietal hemorrhage without evidence of underlying mass lesion or vascular malformation.  Stable small subdural and subarachnoid hemorrhage.  Small amount of blood within the ventricles but no hydrocephalus.  02/23/21 MR Angio Head Neck  WO Contrast  Normal variant MRA of circle of Willis without large vessel stenosis or occlusion or aneurysm.  02/23/21 MR Venogram Head  Normal.  No evidence of venous sinus thrombosis  EEG 6/19 to 6/20  No evidence of seizures  Physical Examination   Constitutional: Frail [place] American female who is intubated and sedated and not in distress. Cardiovascular: Normal RR Respiratory: No increased WOB   Mental status: Alert, eyes open Speech: Speech is fluent  Cranial nerves: Dysconjugate gaze, with slight left eye exotropia and hypertropia. Left visual field cut.  Right gaze preference but able to look to the left past midline. Motor: RUE/RLE strength is normal y. LUE and LLE are antigravity with drift noted to left arm and left leg.  Sensory: Intact to light touch  Coordination: Intact FNF  Gait: Deferred due to left sided weakness    Assessment and Plan   Ms. ELESHIA WOOLEY is a 75 y.o. female w/pmh of GERD, HLD, Stage IIB (T3,  N0, M0) non-small cell lung cancer, adenocarcinoma involving the right upper lobe who initially presented on OSH with AMS, found to have a right parietal parasaggital bleed for which she transferred to [facility] for further work up and intervention.   NEURO #Right Parietal IPH with small frontal subarachnoid hemorrhage-likely hypertensive  patient presented with the symptoms described above. Given that her right parietal bleed is parasaggital CVST was initially suspected. MR Venogram was negative for CVST. MRA Head and Neck + MRI Brain were negative for vascular malformations + mets that could have caused her bleed. Echo EF 60 to 65 %, left atrial size moderately dilated. Her bleed etiology is unclear, will need to repeat an MRI Brain in 2-3 months. Stroke labs w/LDL 101, Hemoglobin A1C 5.4.  - No antiplatelets given bleed - SBP < 140  - Follow up MRI in 2-3 months given bleed etiology is unclear  - At discharge will place ambulatory referral to neurology for stroke follow up   #Right Upper Extremity Shaking  Noted by nursing/CCM team to have RUE shaking morning of 6/19. Long-term EEG monitoring shows continuous generalized and right hemispheric slowing.  No definite seizure activity. - Continue Keppra 500 mg Q12   CARDS #History of Ventricular Bigeminy  #History of Bradycardia  Has been seen by cardiology on an outpatient basis for bradycardia on 01/01/21, bradycardia was felt to be in the setting of ventricular bigeminy. They recommended watchful monitoring with no further testing. She does not have a history of hypertension and does not take an antihtns at home  however SBP here has been elevated as high as 170.  - Wean Clevidipine  - Initiate Lisinopril 5 mg QD  - SBP goal < 140 given bleed   #Hyperlipidemia From a stroke prevention stand point, the LDL goal is < 70. Her LDL is 101. At discharge consider initiating statin, will hold off now given bleed.   RESP #Acute Hypoxemic Respiratory  Failure Initially intubated due to respiratory failure, extubated on 02/24/21.  - Continue with LAMA/LABA combo inhaler   GI  MBBS done today, passed for a regular diet with thin liquids previously had coretrack.  - Initiate regular diet and trend oral intake   RENAL Supplement lytes as needed. Cr WNL - CCM on board for management   ENDO #Diabetes Stroke Screening  A1C noted to be 5.4 this admission. She is not diabetic and A1C is at goal form a stroke reduction stand point < 7.   HEME Hemoglobin hematocrit and platelet count stable  ID #Leukocytosis  Trending white count, at 10.7 today. Last fevered on 02/24/21. No infectious processes at this time. - FFWU if she fevers again  [name], NP  Stroke Service Nurse Practitioner   Hospital day # 3 Patient presented with altered mental status and left hemiplegia due to large right parietal parenchymal hematoma with small subarachnoid hemorrhage etiology indeterminate with strong suspicion for cerebral venous sinus thrombosis given history of lung cancer on chemotherapy.  Continue ventilatory support for respiratory failure.  Extubate as tolerated per CCM.   Marland Kitchen  Strict control of hypertension with systolic goal below  177.  Use as needed IV labetalol and hydralazine and increase p.o. medications through NG tube.   No family available at the bedside today for discussion.  Plan transfer to neurology floor bed.  We will asked medical hospitalist team to see her and resume her care starting tomorrow.  Discussed with Dr. Ernest Mallick critical care medicine.This patient is critically ill and at significant risk of neurological worsening, death and care requires constant monitoring of vital signs, hemodynamics,respiratory and cardiac monitoring, extensive review of multiple databases, frequent neurological assessment, discussion with family, other specialists and medical decision making of high complexity.I have made any additions or clarifications  directly to the above note.This critical care time does not reflect procedure time, or teaching time or supervisory time of PA/NP/Med Resident etc but could involve care discussion time.  I spent 30 minutes of neurocritical care time  in the care of  this patient.      Antony Contras, MD Medical Director [place] Pager: [phone] 02/25/2021 1:07 PM   To contact Stroke Continuity provider, please refer to [email or web address] After hours, contact General Neurology

## 2021-02-26 DIAGNOSIS — K219 Gastro-esophageal reflux disease without esophagitis: Secondary | ICD-10-CM

## 2021-02-26 DIAGNOSIS — E785 Hyperlipidemia, unspecified: Secondary | ICD-10-CM

## 2021-02-26 LAB — CBC WITH DIFFERENTIAL/PLATELET
Abs Immature Granulocytes: 0.04 10*3/uL (ref 0.00–0.07)
Basophils Absolute: 0 10*3/uL (ref 0.0–0.1)
Basophils Relative: 1 %
Eosinophils Absolute: 0 10*3/uL (ref 0.0–0.5)
Eosinophils Relative: 1 %
HCT: 28.6 % — ABNORMAL LOW (ref 36.0–46.0)
Hemoglobin: 9.6 g/dL — ABNORMAL LOW (ref 12.0–15.0)
Immature Granulocytes: 1 %
Lymphocytes Relative: 20 %
Lymphs Abs: 1.4 10*3/uL (ref 0.7–4.0)
MCH: 33.1 pg (ref 26.0–34.0)
MCHC: 33.6 g/dL (ref 30.0–36.0)
MCV: 98.6 fL (ref 80.0–100.0)
Monocytes Absolute: 0.7 10*3/uL (ref 0.1–1.0)
Monocytes Relative: 10 %
Neutro Abs: 4.8 10*3/uL (ref 1.7–7.7)
Neutrophils Relative %: 67 %
Platelets: 243 10*3/uL (ref 150–400)
RBC: 2.9 MIL/uL — ABNORMAL LOW (ref 3.87–5.11)
RDW: 14.1 % (ref 11.5–15.5)
WBC: 7 10*3/uL (ref 4.0–10.5)
nRBC: 0 % (ref 0.0–0.2)

## 2021-02-26 LAB — COMPREHENSIVE METABOLIC PANEL
ALT: 12 U/L (ref 0–44)
AST: 19 U/L (ref 15–41)
Albumin: 3.3 g/dL — ABNORMAL LOW (ref 3.5–5.0)
Alkaline Phosphatase: 70 U/L (ref 38–126)
Anion gap: 14 (ref 5–15)
BUN: 18 mg/dL (ref 8–23)
CO2: 21 mmol/L — ABNORMAL LOW (ref 22–32)
Calcium: 9.3 mg/dL (ref 8.9–10.3)
Chloride: 102 mmol/L (ref 98–111)
Creatinine, Ser: 0.87 mg/dL (ref 0.44–1.00)
GFR, Estimated: >60 mL/min (ref >60)
Glucose, Bld: 94 mg/dL (ref 70–99)
Potassium: 3.6 mmol/L (ref 3.5–5.1)
Sodium: 137 mmol/L (ref 135–145)
Total Bilirubin: 1.1 mg/dL (ref 0.3–1.2)
Total Protein: 6.4 g/dL — ABNORMAL LOW (ref 6.5–8.1)

## 2021-02-26 LAB — GLUCOSE, CAPILLARY
Glucose-Capillary: 102 mg/dL — ABNORMAL HIGH (ref 70–99)
Glucose-Capillary: 104 mg/dL — ABNORMAL HIGH (ref 70–99)
Glucose-Capillary: 89 mg/dL (ref 70–99)
Glucose-Capillary: 86 mg/dL (ref 70–99)
Glucose-Capillary: 81 mg/dL (ref 70–99)
Glucose-Capillary: 80 mg/dL (ref 70–99)

## 2021-02-26 LAB — BRAIN NATRIURETIC PEPTIDE: B Natriuretic Peptide: 202.3 pg/mL — ABNORMAL HIGH (ref 0.0–100.0)

## 2021-02-26 LAB — MAGNESIUM: Magnesium: 2 mg/dL (ref 1.7–2.4)

## 2021-02-26 MED ORDER — SODIUM CHLORIDE 0.9 % IV SOLN
12.5000 mg | Freq: Four times a day (QID) | INTRAVENOUS | Status: DC | PRN
  Administered 2021-02-26 – 2021-02-27 (×2): 12.5 mg via INTRAVENOUS
  Filled 2021-02-26 (×4): qty 0.5

## 2021-02-26 MED ORDER — LABETALOL HCL 5 MG/ML IV SOLN
10.0000 mg | INTRAVENOUS | Status: DC | PRN
  Administered 2021-02-26 – 2021-02-27 (×2): 10 mg via INTRAVENOUS
  Filled 2021-02-26 (×2): qty 4

## 2021-02-26 MED ORDER — FOLIC ACID 1 MG PO TABS
1.0000 mg | ORAL_TABLET | Freq: Every day | ORAL | Status: DC
  Administered 2021-02-26 – 2021-02-27 (×2): 1 mg via ORAL
  Filled 2021-02-26 (×2): qty 1

## 2021-02-26 MED ORDER — LISINOPRIL 10 MG PO TABS
10.0000 mg | ORAL_TABLET | Freq: Every day | ORAL | Status: DC
  Administered 2021-02-26 – 2021-02-27 (×2): 10 mg via ORAL
  Filled 2021-02-26 (×2): qty 1

## 2021-02-26 MED ORDER — METOCLOPRAMIDE HCL 5 MG/ML IJ SOLN
10.0000 mg | Freq: Once | INTRAMUSCULAR | Status: AC
  Administered 2021-02-26: 10 mg via INTRAVENOUS
  Filled 2021-02-26: qty 2

## 2021-02-26 NOTE — Progress Notes (Signed)
Physical Therapy Treatment Patient Details Name: Tonya [name] MRN: [id number] DOB: [birth year] Today's Date: 02/26/2021    History of Present Illness The pt is a 75 yo female presenting 6/18 with c/o R-sided headache. 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 PMH includes: lung cancer s/p R upper lobectomy, currently undergoing chemo, GERD, and HLD.    PT Comments    Pt is very motivated to participate and improve. However, she has very poor deficits, spatial, and safety awareness. She was originally positioned reclined in her chair with the alarm on at the end of the session, but after leaving several times and returning after just a few minutes she would proceed to scoot inferiorly almost out of the chair, needing repositioning. By the 3rd attempt, pt was returned to bed to maintain her safety. Pt needing continual visual, verbal, and tactile cues to use her vision to identify her proximity to objects on either side of her to assist her in finding and maintaining midline in standing. Pt has a strong L lateral lean, stating she felt like she was leaning to her R. This lean to the L impacts her weight shifting to the R for gait. Pt with L inattention, needing cues to look at her L limbs and match them to her R for transfers. Pt needing modAx2 to stand and maxAx2 to take several steps with bil UE support this date. Will continue to follow acutely. Current recommendations remain appropriate.   Follow Up Recommendations  CIR     Equipment Recommendations  Other (comment) (defer to post acute)    Recommendations for Other Services Rehab consult     Precautions / Restrictions Precautions Precautions: Fall Precaution Comments: L inattention Restrictions Weight Bearing Restrictions: No    Mobility  Bed Mobility Overal bed mobility: Needs Assistance Bed Mobility: Supine to Sit     Supine to sit: Min assist;HOB elevated     General bed mobility comments: MinA to  manage L leg and trunk to sit EOB. Pt needing cues to flex L knee as she tends to keep it extended. Cues to square hips with EOB.    Transfers Overall transfer level: Needs assistance Equipment used: 2 person hand held assist;Rolling walker (2 wheeled) Transfers: Sit to/from [facility] Sit to Stand: Mod assist;+2 physical assistance;+2 safety/equipment Stand pivot transfers: Max assist;+2 physical assistance;+2 safety/equipment       General transfer comment: Sit to stand 1x from EOB, 1x from commode, and 1x from recliner. Faded feddback provided with each rep in regards to cuing pt for proper feet placement. Pt needs cues to look at her bil feet and match her L to her R along with physical assistance. cues to push up from current sitting surface and to place L hand on RW. MaxAx2 to stand step to L bed > commode 1x and recliner > bed 1x, providing L knee block during stance and blocking strong L lean.  Ambulation/Gait Ambulation/Gait assistance: Max assist;+2 physical assistance;+2 safety/equipment Gait Distance (Feet): 2 Feet Assistive device: 2 person hand held assist;Rolling walker (2 wheeled) Gait Pattern/deviations: Step-to pattern;Decreased stance time - right;Decreased stance time - left;Decreased step length - left;Decreased step length - right;Decreased stride length;Decreased dorsiflexion - left;Decreased weight shift to right;Trunk flexed;Narrow base of support Gait velocity: reduced Gait velocity interpretation: <1.31 ft/sec, indicative of household ambulator General Gait Details: Stand step transfers towards L 2x with UE support and another gait bout with RW performed standing anterior to recliner, cuing pt to  step anterior <> posterior 3x each foot. L knee block during L stance with cues at L posterior thigh to advance leg during step phase. Cues provided to use vision to find midline to decrease her L lateral lean. Improved R weight shift with subsequent  reps.   Stairs             Wheelchair Mobility    Modified Rankin (Stroke Patients Only) Modified Rankin (Stroke Patients Only) Pre-Morbid Rankin Score: No symptoms Modified Rankin: Severe disability     Balance Overall balance assessment: Needs assistance Sitting-balance support: No upper extremity supported;Feet supported Sitting balance-Leahy Scale: Fair Sitting balance - Comments: Static sitting EOB with min guard. Postural control: Left lateral lean Standing balance support: Bilateral upper extremity supported;During functional activity Standing balance-Leahy Scale: Poor Standing balance comment: Bil UE support and mod-maxAx2 to stand. Cued pt to reach off BOS minimally with L UE to perform pericare while R UE was supported on RW. Pt with strong L lateral lean, needing cues to look at either side and find midline between 2 objects, fair carryover.                            Cognition Arousal/Alertness: Awake/alert Behavior During Therapy: WFL for tasks assessed/performed Overall Cognitive Status: Impaired/Different from baseline Area of Impairment: Attention;Following commands;Awareness;Problem solving;Safety/judgement;Memory                   Current Attention Level: Focused Memory: Decreased short-term memory Following Commands: Follows one step commands with increased time;Follows one step commands consistently;Follows multi-step commands inconsistently Safety/Judgement: Decreased awareness of safety;Decreased awareness of deficits Awareness: Emergent Problem Solving: Slow processing;Decreased initiation;Requires verbal cues;Difficulty sequencing;Requires tactile cues General Comments: Pt slow to process commands, but follows single step commands consistently. Pt with L inattention, needing repeated multi-modal cues to attend to L side and find and maintain midline. Pt forgetful of cues to not scoot inferiorly in recliner, thus poor safety  awareness and memory deficits. Poor sequencing. Of note, after leaving room several minutes pt had again scooted down almost out of chair (3x total noted this date), thus pt returned to bed to maintain her safety.      Exercises Other Exercises Other Exercises: Scanning L <> R to identify proximity to objects on either side to encourage midline alignment in standing Other Exercises: Grasping washcloth with L hand and reaching off BOS minimally to perform pericare while standing    General Comments        Pertinent Vitals/Pain Pain Assessment: Faces Faces Pain Scale: No hurt Pain Intervention(s): Monitored during session    Home Living                      Prior Function            PT Goals (current goals can now be found in the care plan section) Acute Rehab PT Goals Patient Stated Goal: to improve PT Goal Formulation: With patient Time For Goal Achievement: 03/10/21 Potential to Achieve Goals: Good Progress towards PT goals: Progressing toward goals    Frequency    Min 4X/week      PT Plan Current plan remains appropriate    Co-evaluation              AM-PAC PT "6 Clicks" Mobility   Outcome Measure  Help needed turning from your back to your side while in a flat bed without using bedrails?: A Little Help  needed moving from lying on your back to sitting on the side of a flat bed without using bedrails?: A Lot Help needed moving to and from a bed to a chair (including a wheelchair)?: Total Help needed standing up from a chair using your arms (e.g., wheelchair or bedside chair)?: A Lot Help needed to walk in hospital room?: Total Help needed climbing 3-5 steps with a railing? : Total 6 Click Score: 10    End of Session Equipment Utilized During Treatment: Gait belt Activity Tolerance: Patient tolerated treatment well Patient left: in bed;with call bell/phone within reach;with bed alarm set (in chair with alarm on initially but pt scooted  inferiorly in chair again and thus was returned back to bed) Nurse Communication: Mobility status;Other (comment) (unsafe tendencies in chair) PT Visit Diagnosis: Other abnormalities of gait and mobility (R26.89);Hemiplegia and hemiparesis;Unsteadiness on feet (R26.81);Muscle weakness (generalized) (M62.81);Difficulty in walking, not elsewhere classified (R26.2);Other symptoms and signs involving the nervous system (R29.898) Hemiplegia - Right/Left: Left Hemiplegia - dominant/non-dominant: Non-dominant Hemiplegia - caused by: Nontraumatic intracerebral hemorrhage;Nontraumatic [facility]     Time: 2355-7322 PT Time Calculation (min) (ACUTE ONLY): 45 min  Charges:  $Gait Training: 8-22 mins $Therapeutic Activity: 23-37 mins                     Moishe Spice, PT, DPT Acute Rehabilitation Services  Pager: [phone] Office: [place] 02/26/2021, 4:10 PM

## 2021-02-26 NOTE — Progress Notes (Addendum)
STROKE TEAM PROGRESS NOTE    Interval History  No acute events overnight.  Patient is lying comfortably in bed.  Blood pressure adequately controlled. Neurological examination is stable/improving. She still has right gaze preference but can cross midline and is able to elevate LUE/LLE. No family at bedside.  Pertinent Lab Work and Imaging    02/23/21 CT Head WO IV Contrast 1. Intraparenchymal hematoma centered in the high right parietal lobe with subarachnoid extension over the right convexity and into the basal cisterns. 2. No midline shift or other mass effect.  02/23/21 MR Brain W WO Contrast  Stable appearance of right parietal hemorrhage without evidence of underlying mass lesion or vascular malformation.  Stable small subdural and subarachnoid hemorrhage.  Small amount of blood within the ventricles but no hydrocephalus.  02/23/21 MR Angio Head Neck  WO Contrast  Normal variant MRA of circle of Willis without large vessel stenosis or occlusion or aneurysm.  02/23/21 MR Venogram Head  Normal.  No evidence of venous sinus thrombosis  EEG 6/19 to 6/20  No evidence of seizures  Physical Examination   Constitutional: Frail African American female resting in bed  Cardiovascular: Normal RR Respiratory: No increased WOB   Mental status: AAOx3 Speech: Fluent, no dysarthria  Cranial nerves: Dysconjugate gaze, with slight left eye exotropia and hypertropia. Left visual field cut.  Right gaze preference, able to cross midline  Motor: RUE/RLE strength is normal. LUE and LLE are antigravity. She has left hemiparesis 3/5 strength with a left wrist drop and left foot drop.  Sensory: Intact to light touch  Coordination: Intact FNF RUE + LUE  Gait: Deferred due to left sided weakness    Assessment and Plan   [name] is a 75 y.o. female w/pmh of GERD, HLD, Stage IIB (T3, N0, M0) non-small cell lung cancer, adenocarcinoma involving the right upper lobe who initially  presented on OSH with AMS, found to have a right parietal parasaggital bleed for which she transferred to [facility] for further work up and intervention.   NEURO #Right Parietal IPH with small frontal subarachnoid hemorrhage-likely hypertensive  patient presented with the symptoms described above. Given that her right parietal bleed is parasaggital CVST was initially suspected. MR Venogram was negative for CVST. MRA Head and Neck + MRI Brain were negative for vascular malformations + mets that could have caused her bleed. Echo EF 60 to 65 %, left atrial size moderately dilated. Her bleed etiology is unclear, will need to repeat an MRI Brain in 2-3 months. Stroke labs w/LDL 101, Hemoglobin A1C 5.4.  - No antiplatelets given bleed - SBP < 140  - Follow up MRI in 2-3 months given bleed etiology is unclear  - At discharge will place ambulatory referral to neurology for stroke follow up   #Right Upper Extremity Shaking  Noted by nursing/CCM team to have RUE shaking morning of 6/19. Long-term EEG monitoring shows continuous generalized and right hemispheric slowing.  No definite seizure activity. - Continue Keppra 500 mg Q12, can be discontinued on an outpatient basis when she has neurology follow up   CARDS #History of Ventricular Bigeminy  #History of Bradycardia  Has been seen by cardiology on an outpatient basis for bradycardia on 01/01/21, bradycardia was felt to be in the setting of ventricular bigeminy. They recommended watchful monitoring with no further testing. She does not have a history of hypertension and does not take an antihtns at home however SBP here has been elevated as high as 170.  Lisinopril was initiated on 02/25/21 at 5 mg and is being up titrated for SBP control, another agent will be added as indicated  - Continue Lisinopril 10 mg QD  - Add another antihypertensive as necessary  - SBP goal < 160 given bleed   #Hyperlipidemia From a stroke prevention stand point, the LDL goal is < 70.  Her LDL is 101. At discharge consider initiating statin, will hold off now given bleed.   RESP #Acute Hypoxemic Respiratory Failure Initially intubated due to respiratory failure, extubated on 02/24/21.  - Continue with LAMA/LABA combo inhaler   GI  MBBS was done 02/25/21, she passed for a regular diet with thin liquids  - Continue with regular diet and trend oral intake   RENAL Supplement lytes as needed. Cr WNL - CCM on board for management   ENDO #Diabetes Stroke Screening  A1C noted to be 5.4 this admission. She is not diabetic and A1C is at goal form a stroke reduction stand point < 7.   HEME Hemoglobin hematocrit and platelet count stable  ID Previously had an elevated white count, this has resolved. Last fevered 02/24/21. No infectious processes at this time. - FFWU if she fevers again  [name], NP  Stroke Service Nurse Practitioner   Hospital day # 4 Patient presented with altered mental status and left hemiplegia due to large right parietal parenchymal hematoma with small subarachnoid hemorrhage etiology indeterminate  .  Strict control of hypertension with systolic goal below  470.  Use as needed IV labetalol and hydralazine and increase p.o. medications through NG tube.   No family available at the bedside today for discussion.  Plan transfer to neurology floor bed.  We have asked medical hospitalist team to see her and assume her care starting today   .  Greater than 50% time during this 35-minute visit were spent in counseling and coordination of care about her intracerebral hemorrhage) and answering questions and discussion with care team.      Antony Contras, MD Medical Director [place] Pager: [phone] 02/26/2021 11:20 AM   To contact Stroke Continuity provider, please refer to [email or web address] After hours, contact General Neurology

## 2021-02-26 NOTE — Progress Notes (Signed)
PROGRESS NOTE    Tonya [name]  VWP:[id number] DOB: Sep 13, 1945 DOA: 02/22/2021 PCP: Lin Landsman, MD   Brief Narrative: Tonya [name] is a 75 y.o. female with history of stage II lung cancer status post right upper lobectomy currently on chemotherapy, hyperlipidemia, anxiety, GERD.  Patient presented secondary to altered mental status and found to have an acute hemorrhagic stroke.  She was transferred to [facility] for higher level of care and neurology management.  Patient required intubation for acute respiratory failure and managed in ICU.   Assessment & Plan:   Active Problems:   GERD (gastroesophageal reflux disease)   Hyperlipidemia with target low density lipoprotein (LDL) cholesterol less than 100 mg/dL   Intracranial hemorrhage (HCC)   Stroke, 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HCC)   Right parietal intraparenchymal hemorrhage Associated small frontal subarachnoid hemorrhage. Unclear etiology. Patient underwent MR venogram which was negative for cerebral venous sinus thrombosis. MRA head/neck/MRI brain negative for vascular malformations/mets. Transthoracic Echocardiogram without evidence of embolic source. Recommendation for outpatient neurology follow-up.  Dysphagia Left-sided neglect Speech therapy consulted and MBS performed. Now with recommendation for regular solids.  Hypertension Goal SBP <140 per neurology. Patient was not on antihypertensive medication prior to admission. Started on Clevidipine initially and now transitioned to lisinopril. -Neurology recommendations: repeat MRI brain in 2-3 months, SBP goal <140 mmHg -Increase to lisinopril 10 mg daily  Acute respiratory failure with hypoxia Patient required intubation on 6/18. She was successfully extubated on 6/20. Resolved.  Irregular heart rate Concern for possible paroxysmal atrial fibrillation  Stage IIb NSCLC Patient follows with Dr. Julien Nordmann as an outpatient. She is currently on  chemotherapy.  Hypokalemia Repleted. Resolved.  History of bradycardia History of ventricular bigeminy Noted.  DVT prophylaxis: SCDs Code Status:   Code Status: Full Code Family Communication: None at bedside Disposition Plan: Discharge to CIR when bed is available. Medically stable for discharge.   Consultants:  Neurology Neurosurgery PCCM  Procedures:  TRANSTHORACIC ECHOCARDIOGRAM (02/24/2021) IMPRESSIONS     1. Left ventricular ejection fraction, by estimation, is 60 to 65%. The  left ventricle has normal function. The left ventricle has no regional  wall motion abnormalities. There is moderate left ventricular hypertrophy.  Left ventricular diastolic  parameters are consistent with Grade I diastolic dysfunction (impaired  relaxation).   2. Right ventricular systolic function is normal. The right ventricular  size is normal.   3. Left atrial size was mildly dilated.   4. The mitral valve is normal in structure. No evidence of mitral valve  regurgitation.   5. The aortic valve is tricuspid. Aortic valve regurgitation is not  visualized.   Antimicrobials: None    Subjective: Headache today that is located posteriorly. No other symptoms associated.  Objective: Vitals:   02/26/21 0700 02/26/21 0800 02/26/21 0815 02/26/21 0900  BP: (!) 153/68 (!) 157/73  (!) 145/62  Pulse: 74 74  62  Resp: 15 (!) 21  13  Temp:  99.9 F (37.7 C)    TempSrc:  Oral    SpO2: 97% 93% 98% 98%  Weight:      Height:        Intake/Output Summary (Last 24 hours) at 02/26/2021 0926 Last data filed at 02/26/2021 0700 Gross per 24 hour  Intake 618.73 ml  Output 1060 ml  Net -441.27 ml   Filed Weights   02/22/21 2026 02/23/21 0125  Weight: 62 kg 62.4 kg    Examination:  General exam: Appears calm and comfortable Respiratory system: Clear to auscultation. Respiratory  effort normal. Cardiovascular system: S1 & S2 heard, RRR. No murmurs. Gastrointestinal system: Abdomen is  nondistended, soft and nontender. No organomegaly or masses felt. Normal bowel sounds heard. Central nervous system: Alert and oriented. Musculoskeletal: No edema. No calf tenderness Skin: No cyanosis. No rashes Psychiatry: Judgement and insight appear normal. Mood & affect appropriate.     Data Reviewed: I have personally reviewed following labs and imaging studies  CBC Lab Results  Component Value Date   WBC 7.0 02/26/2021   RBC 2.90 (L) 02/26/2021   HGB 9.6 (L) 02/26/2021   HCT 28.6 (L) 02/26/2021   MCV 98.6 02/26/2021   MCH 33.1 02/26/2021   PLT 243 02/26/2021   MCHC 33.6 02/26/2021   RDW 14.1 02/26/2021   LYMPHSABS 1.4 02/26/2021   MONOABS 0.7 02/26/2021   EOSABS 0.0 02/26/2021   BASOSABS 0.0 36/62/9476     Last metabolic panel Lab Results  Component Value Date   NA 137 02/26/2021   K 3.6 02/26/2021   CL 102 02/26/2021   CO2 21 (L) 02/26/2021   BUN 18 02/26/2021   CREATININE 0.87 02/26/2021   GLUCOSE 94 02/26/2021   GFRNONAA >60 02/26/2021   GFRAA >60 04/01/2020   CALCIUM 9.3 02/26/2021   PHOS 2.8 02/23/2021   PROT 6.4 (L) 02/26/2021   ALBUMIN 3.3 (L) 02/26/2021   BILITOT 1.1 02/26/2021   ALKPHOS 70 02/26/2021   AST 19 02/26/2021   ALT 12 02/26/2021   ANIONGAP 14 02/26/2021    CBG (last 3)  Recent Labs    02/25/21 2314 02/26/21 0313 02/26/21 0718  GLUCAP 101* 102* 104*     GFR: Estimated Creatinine Clearance: 50.5 mL/min (by C-G formula based on SCr of 0.87 mg/dL).  Coagulation Profile: Recent Labs  Lab 02/22/21 2100  INR 1.1    Recent Results (from the past 240 hour(s))  Resp Panel by RT-PCR (Flu A&B, Covid) Nasopharyngeal Swab     Status: None   Collection Time: 02/22/21  8:46 PM   Specimen: Nasopharyngeal Swab; Nasopharyngeal(NP) swabs in vial transport medium  Result Value Ref Range Status   SARS Coronavirus 2 by RT PCR NEGATIVE NEGATIVE Final    Comment: (NOTE) SARS-CoV-2 target nucleic acids are NOT DETECTED.  The SARS-CoV-2  RNA is generally detectable in upper respiratory specimens during the acute phase of infection. The lowest concentration of SARS-CoV-2 viral copies this assay can detect is 138 copies/mL. A negative result does not preclude SARS-Cov-2 infection and should not be used as the sole basis for treatment or other patient management decisions. A negative result may occur with  improper specimen collection/handling, submission of specimen other than nasopharyngeal swab, presence of viral mutation(s) within the areas targeted by this assay, and inadequate number of viral copies(<138 copies/mL). A negative result must be combined with clinical observations, patient history, and epidemiological information. The expected result is Negative.  Fact Sheet for Patients:  EntrepreneurPulse.com.au  Fact Sheet for Healthcare Providers:  IncredibleEmployment.be  This test is no t yet approved or cleared by the [place] FDA and  has been authorized for detection and/or diagnosis of SARS-CoV-2 by FDA under an Emergency Use Authorization (EUA). This EUA will remain  in effect (meaning this test can be used) for the duration of the COVID-19 declaration under Section 564(b)(1) of the Act, 21 U.S.C.section 360bbb-3(b)(1), unless the authorization is terminated  or revoked sooner.       Influenza A by PCR NEGATIVE NEGATIVE Final   Influenza B by PCR NEGATIVE NEGATIVE Final  Comment: (NOTE) The Xpert Xpress SARS-CoV-2/FLU/RSV plus assay is intended as an aid in the diagnosis of influenza from Nasopharyngeal swab specimens and should not be used as a sole basis for treatment. Nasal washings and aspirates are unacceptable for Xpert Xpress SARS-CoV-2/FLU/RSV testing.  Fact Sheet for Patients: EntrepreneurPulse.com.au  Fact Sheet for Healthcare Providers: IncredibleEmployment.be  This test is not yet approved or cleared by the  [place] FDA and has been authorized for detection and/or diagnosis of SARS-CoV-2 by FDA under an Emergency Use Authorization (EUA). This EUA will remain in effect (meaning this test can be used) for the duration of the COVID-19 declaration under Section 564(b)(1) of the Act, 21 U.S.C. section 360bbb-3(b)(1), unless the authorization is terminated or revoked.  Performed at [facility], [place] [address], [place], [place] [address]   MRSA Next Gen by PCR, Nasal     Status: None   Collection Time: 02/23/21  1:35 AM   Specimen: Nasal Mucosa; Nasal Swab  Result Value Ref Range Status   MRSA by PCR Next Gen NOT DETECTED NOT DETECTED Final    Comment: (NOTE) The GeneXpert MRSA Assay (FDA approved for NASAL specimens only), is one component of a comprehensive MRSA colonization surveillance program. It is not intended to diagnose MRSA infection nor to guide or monitor treatment for MRSA infections. Test performance is not FDA approved in patients less than 20 years old. Performed at [facility], [place] [address] [place], [place] [address]         Radiology Studies: DG Swallowing Func-Speech Pathology  Result Date: 02/25/2021 Formatting of this result is different from the original. Objective Swallowing Evaluation: Type of Study: MBS-Modified Barium Swallow Study  Patient Details Name: Tonya [name] MRN: [id number] Date of Birth: 01/20/1946 Today's Date: 02/25/2021 Time: SLP Start Time (ACUTE ONLY): 1028 -SLP Stop Time (ACUTE ONLY): 5320 SLP Time Calculation (min) (ACUTE ONLY): 17 min Past Medical History: Past Medical History: Diagnosis Date  Adenoma of left adrenal gland   Cancer ([place])   Lung  Colon polyps   hyperplastic  Complication of anesthesia   Very emotional and cries after anesthesia  GERD (gastroesophageal reflux disease)   Kidney stone   Liver lesion   Microhematuria  Past Surgical History: Past Surgical History: Procedure Laterality Date   ABDOMINAL HYSTERECTOMY    APPENDECTOMY    BRONCHIAL BIOPSY  07/18/2020  Procedure: BRONCHIAL BIOPSIES;  Surgeon: [name], DO;  Location: [place] [facility];  Service: Pulmonary;;  BRONCHIAL BRUSHINGS  07/18/2020  Procedure: BRONCHIAL BRUSHINGS;  Surgeon: [name], DO;  Location: [place];  Service: Pulmonary;;  BRONCHIAL NEEDLE ASPIRATION BIOPSY  07/18/2020  Procedure: BRONCHIAL NEEDLE ASPIRATION BIOPSIES;  Surgeon: [name], DO;  Location: [place] [facility];  Service: Pulmonary;;  BRONCHIAL WASHINGS  07/18/2020  Procedure: BRONCHIAL WASHINGS;  Surgeon: [name], DO;  Location: [place] [facility];  Service: Pulmonary;;  FIDUCIAL MARKER PLACEMENT  07/18/2020  Procedure: FIDUCIAL MARKER PLACEMENT;  Surgeon: [name], DO;  Location: [place] [facility];  Service: Pulmonary;;  INTERCOSTAL NERVE BLOCK Right 08/26/2020  Procedure: INTERCOSTAL NERVE BLOCK;  Surgeon: [name], MD;  Location: [place];  Service: Thoracic;  Laterality: Right;  IR THORACENTESIS ASP PLEURAL SPACE W/IMG GUIDE  10/10/2020  NODE DISSECTION Right 08/26/2020  Procedure: NODE DISSECTION;  Surgeon: [name], MD;  Location: [place];  Service: Thoracic;  Laterality: Right;  THORACENTESIS Right 11/04/2020  Procedure: [place];  Surgeon: [name], DO;  Location: [place];  Service: Pulmonary;  Laterality: Right;  THORACENTESIS Right 11/29/2020  Procedure: THORACENTESIS;  Surgeon: [name], DO;  Location: [facility];  Service: Pulmonary;  Laterality: Right;  VIDEO BRONCHOSCOPY WITH ENDOBRONCHIAL NAVIGATION N/A 07/18/2020  Procedure: VIDEO BRONCHOSCOPY WITH ENDOBRONCHIAL NAVIGATION;  Surgeon: [name], DO;  Location: [place];  Service: Pulmonary;  Laterality: N/A;  VIDEO BRONCHOSCOPY WITH ENDOBRONCHIAL ULTRASOUND  07/18/2020  Procedure: VIDEO BRONCHOSCOPY WITH ENDOBRONCHIAL ULTRASOUND;  Surgeon: [name], DO;  Location: [facility];  Service: Pulmonary;; HPI: 75 yr old  admitted with headache, confusion. Found to have CT head shows a large intraparenchymal hemorrhage within the R parietal lobe as well as some R frontal subarachnoid hemorrhage. PMH: lung cancer s/p R upper lobectomy, currently undergoing chemotherapy , GERD, kidney stone, adenoma of left adrenal gland  Subjective: alert, pleasant upright in chair for procedure Assessment / Plan / Recommendation CHL IP CLINICAL IMPRESSIONS 02/25/2021 Clinical Impression Pt presents with a mild oral and likely component of esophageal dysphagia.  Oral deficits c/b reduced bolus cohesion, mild oral bolus delay, and mild oral residuals across POs. Second volitional swallows assisted in clearing oral cavity. Pharyngeal phase of the swallow was within functional limits. Airway protection was completly intact without laryngeal penetration or tracheal aspiration. Swallowing efficiency was also intact. Pt noted to have brief barium tablet stasis in mid esophagus with barium retention requiring additional liquid wash to pass to stomach. Esophageal barium reflux also exhibited. MBSS is not a test of the esophagus. If any esophageal clincial symptoms are exhibited, consider further esophageal workup. Recommend regular thin liquid diet. SLP to follow up. SLP Visit Diagnosis Dysphagia, oral phase (R13.11);Dysphagia, unspecified (R13.10) Attention and concentration deficit following -- Frontal lobe and executive function deficit following -- Impact on safety and function Mild aspiration risk   CHL IP TREATMENT RECOMMENDATION 02/25/2021 Treatment Recommendations Therapy as outlined in treatment plan below   Prognosis 02/25/2021 Prognosis for Safe Diet Advancement Good Barriers to Reach Goals Cognitive deficits Barriers/Prognosis Comment -- CHL IP DIET RECOMMENDATION 02/25/2021 SLP Diet Recommendations Regular solids;Thin liquid Liquid Administration via Cup;Straw Medication Administration Whole meds with liquid Compensations Minimize environmental  distractions;Small sips/bites;Slow rate Postural Changes Remain semi-upright after after feeds/meals (Comment);Seated upright at 90 degrees   CHL IP OTHER RECOMMENDATIONS 02/25/2021 Recommended Consults -- Oral Care Recommendations Oral care BID Other Recommendations --   CHL IP FOLLOW UP RECOMMENDATIONS 02/25/2021 Follow up Recommendations Inpatient Rehab   CHL IP FREQUENCY AND DURATION 02/25/2021 Speech Therapy Frequency (ACUTE ONLY) min 2x/week Treatment Duration 2 weeks      CHL IP ORAL PHASE 02/25/2021 Oral Phase Impaired Oral - Pudding Teaspoon -- Oral - Pudding Cup -- Oral - Honey Teaspoon -- Oral - Honey Cup -- Oral - Nectar Teaspoon -- Oral - Nectar Cup Delayed oral transit;Lingual/palatal residue Oral - Nectar Straw -- Oral - Thin Teaspoon -- Oral - Thin Cup Delayed oral transit;Lingual/palatal residue Oral - Thin Straw Lingual/palatal residue;Delayed oral transit Oral - Puree Lingual/palatal residue;Decreased bolus cohesion Oral - Mech Soft Lingual/palatal residue;Decreased bolus cohesion Oral - Regular -- Oral - Multi-Consistency -- Oral - Pill Lingual/palatal residue;Delayed oral transit;Reduced posterior propulsion;Lingual pumping Oral Phase - Comment --  CHL IP PHARYNGEAL PHASE 02/25/2021 Pharyngeal Phase Impaired Pharyngeal- Pudding Teaspoon -- Pharyngeal -- Pharyngeal- Pudding Cup -- Pharyngeal -- Pharyngeal- Honey Teaspoon -- Pharyngeal -- Pharyngeal- Honey Cup -- Pharyngeal -- Pharyngeal- Nectar Teaspoon -- Pharyngeal -- Pharyngeal- Nectar Cup Delayed swallow initiation-pyriform sinuses Pharyngeal -- Pharyngeal- Nectar Straw -- Pharyngeal -- Pharyngeal- Thin Teaspoon --  Pharyngeal -- Pharyngeal- Thin Cup Delayed swallow initiation-pyriform sinuses Pharyngeal -- Pharyngeal- Thin Straw Delayed swallow initiation-pyriform sinuses Pharyngeal -- Pharyngeal- Puree Delayed swallow initiation-vallecula Pharyngeal -- Pharyngeal- Mechanical Soft Delayed swallow initiation-vallecula Pharyngeal -- Pharyngeal-  Regular -- Pharyngeal -- Pharyngeal- Multi-consistency -- Pharyngeal -- Pharyngeal- Pill Delayed swallow initiation-pyriform sinuses Pharyngeal -- Pharyngeal Comment --  CHL IP CERVICAL ESOPHAGEAL PHASE 02/25/2021 Cervical Esophageal Phase WFL Pudding Teaspoon -- Pudding Cup -- Honey Teaspoon -- Honey Cup -- Nectar Teaspoon -- Nectar Cup -- Nectar Straw -- Thin Teaspoon -- Thin Cup -- Thin Straw -- Puree -- Mechanical Soft -- Regular -- Multi-consistency -- Pill -- Cervical Esophageal Comment -- Chelsea E Hartness MA, CCC-SLP 02/25/2021, 11:10 AM                   Scheduled Meds:  chlorhexidine gluconate (MEDLINE KIT)  15 mL Mouth Rinse BID   Chlorhexidine Gluconate Cloth  6 each Topical Daily   insulin aspart  1-3 Units Subcutaneous Q4H   lisinopril  10 mg Oral Daily   senna-docusate  1 tablet Oral BID   umeclidinium-vilanterol  1 puff Inhalation Daily   Continuous Infusions:  clevidipine Stopped (02/25/21 0816)   levETIRAcetam Stopped (02/26/21 0551)     LOS: 4 days     Cordelia Poche, MD [facility] 02/26/2021, 9:26 AM  If 7PM-7AM, please contact night-coverage [email or web address]

## 2021-02-26 NOTE — Progress Notes (Signed)
Inpatient Rehab Admissions Coordinator:    I met with patient and her husband at the bedside to explain CIR recommendations.  We discussed average length of stay to be about 2 weeks, dependent upon progress, with goals of supervision to minimal assistance at discharge.  I let them know that I would need insurance authorization and that they typically do not approve for admission to SNF directly from CIR.  Plan would have to be for home with 24/7 support, which husband states they can provide.  I will start insurance auth today and follow for determination.   Shann Medal, PT, DPT Admissions Coordinator [phone] 02/26/21  12:08 PM

## 2021-02-27 ENCOUNTER — Inpatient Hospital Stay (HOSPITAL_COMMUNITY)
Admission: RE | Admit: 2021-02-27 | Discharge: 2021-03-25 | DRG: 057 | Disposition: A | Discharge status: Home Health | Payer: Medicare HMO | Source: Intra-hospital | Attending: Physical Medicine & Rehabilitation | Admitting: Physical Medicine & Rehabilitation

## 2021-02-27 ENCOUNTER — Encounter (HOSPITAL_COMMUNITY): Payer: Self-pay | Admitting: Physical Medicine & Rehabilitation

## 2021-02-27 ENCOUNTER — Other Ambulatory Visit: Payer: Self-pay

## 2021-02-27 DIAGNOSIS — Z7983 Long term (current) use of bisphosphonates: Secondary | ICD-10-CM | POA: Diagnosis not present

## 2021-02-27 DIAGNOSIS — Z8249 Family history of ischemic heart disease and other diseases of the circulatory system: Secondary | ICD-10-CM | POA: Diagnosis not present

## 2021-02-27 DIAGNOSIS — Z85118 Personal history of other malignant neoplasm of bronchus and lung: Secondary | ICD-10-CM | POA: Diagnosis not present

## 2021-02-27 DIAGNOSIS — Z87891 Personal history of nicotine dependence: Secondary | ICD-10-CM

## 2021-02-27 DIAGNOSIS — S0633AA Contusion and laceration of cerebrum, unspecified, with loss of consciousness status unknown, initial encounter: Secondary | ICD-10-CM

## 2021-02-27 DIAGNOSIS — Z91038 Other insect allergy status: Secondary | ICD-10-CM | POA: Diagnosis not present

## 2021-02-27 DIAGNOSIS — S06340A Traumatic hemorrhage of right cerebrum without loss of consciousness, initial encounter: Secondary | ICD-10-CM

## 2021-02-27 DIAGNOSIS — K219 Gastro-esophageal reflux disease without esophagitis: Secondary | ICD-10-CM | POA: Diagnosis present

## 2021-02-27 DIAGNOSIS — Z880 Allergy status to penicillin: Secondary | ICD-10-CM

## 2021-02-27 DIAGNOSIS — I959 Hypotension, unspecified: Secondary | ICD-10-CM | POA: Diagnosis not present

## 2021-02-27 DIAGNOSIS — D638 Anemia in other chronic diseases classified elsewhere: Secondary | ICD-10-CM | POA: Diagnosis present

## 2021-02-27 DIAGNOSIS — M5410 Radiculopathy, site unspecified: Secondary | ICD-10-CM | POA: Diagnosis not present

## 2021-02-27 DIAGNOSIS — R7989 Other specified abnormal findings of blood chemistry: Secondary | ICD-10-CM | POA: Diagnosis not present

## 2021-02-27 DIAGNOSIS — I69119 Unspecified symptoms and signs 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: Secondary | ICD-10-CM

## 2021-02-27 DIAGNOSIS — M545 Low back pain, unspecified: Secondary | ICD-10-CM

## 2021-02-27 DIAGNOSIS — G44209 Tension-type headache, unspecified, not intractable: Secondary | ICD-10-CM | POA: Diagnosis present

## 2021-02-27 DIAGNOSIS — Z902 Acquired absence of lung [part of]: Secondary | ICD-10-CM

## 2021-02-27 DIAGNOSIS — R1314 Dysphagia, pharyngoesophageal phase: Secondary | ICD-10-CM | POA: Diagnosis present

## 2021-02-27 DIAGNOSIS — I69191 Dysphagia following nontraumatic intracerebral hemorrhage: Secondary | ICD-10-CM

## 2021-02-27 DIAGNOSIS — I69091 Dysphagia following nontraumatic subarachnoid hemorrhage: Secondary | ICD-10-CM | POA: Diagnosis not present

## 2021-02-27 DIAGNOSIS — F419 Anxiety disorder, unspecified: Secondary | ICD-10-CM | POA: Diagnosis present

## 2021-02-27 DIAGNOSIS — G8929 Other chronic pain: Secondary | ICD-10-CM

## 2021-02-27 DIAGNOSIS — M5442 Lumbago with sciatica, left side: Secondary | ICD-10-CM | POA: Diagnosis not present

## 2021-02-27 DIAGNOSIS — I10 Essential (primary) hypertension: Secondary | ICD-10-CM | POA: Diagnosis present

## 2021-02-27 DIAGNOSIS — G4700 Insomnia, unspecified: Secondary | ICD-10-CM | POA: Diagnosis not present

## 2021-02-27 DIAGNOSIS — I69054 Hemiplegia and hemiparesis following nontraumatic subarachnoid hemorrhage affecting left non-dominant side: Principal | ICD-10-CM

## 2021-02-27 DIAGNOSIS — R52 Pain, unspecified: Secondary | ICD-10-CM

## 2021-02-27 DIAGNOSIS — I69019 Unspecified symptoms and signs involving cognitive functions following nontraumatic subarachnoid hemorrhage: Secondary | ICD-10-CM

## 2021-02-27 DIAGNOSIS — I69154 Hemiplegia and hemiparesis following nontraumatic intracerebral hemorrhage affecting left non-dominant side: Secondary | ICD-10-CM

## 2021-02-27 DIAGNOSIS — G43909 Migraine, unspecified, not intractable, without status migrainosus: Secondary | ICD-10-CM | POA: Diagnosis present

## 2021-02-27 DIAGNOSIS — G441 Vascular headache, not elsewhere classified: Secondary | ICD-10-CM | POA: Diagnosis not present

## 2021-02-27 DIAGNOSIS — S06360A Traumatic hemorrhage of cerebrum, unspecified, without loss of consciousness, initial encounter: Secondary | ICD-10-CM

## 2021-02-27 DIAGNOSIS — I619 Nontraumatic intracerebral hemorrhage, unspecified: Secondary | ICD-10-CM | POA: Diagnosis not present

## 2021-02-27 DIAGNOSIS — Z7982 Long term (current) use of aspirin: Secondary | ICD-10-CM | POA: Diagnosis not present

## 2021-02-27 DIAGNOSIS — R519 Headache, unspecified: Secondary | ICD-10-CM

## 2021-02-27 DIAGNOSIS — E876 Hypokalemia: Secondary | ICD-10-CM | POA: Diagnosis present

## 2021-02-27 DIAGNOSIS — Z79899 Other long term (current) drug therapy: Secondary | ICD-10-CM

## 2021-02-27 DIAGNOSIS — M5432 Sciatica, left side: Secondary | ICD-10-CM

## 2021-02-27 DIAGNOSIS — S06310A Contusion and laceration of right cerebrum without loss of consciousness, initial encounter: Secondary | ICD-10-CM

## 2021-02-27 DIAGNOSIS — G629 Polyneuropathy, unspecified: Secondary | ICD-10-CM

## 2021-02-27 DIAGNOSIS — M533 Sacrococcygeal disorders, not elsewhere classified: Secondary | ICD-10-CM | POA: Diagnosis not present

## 2021-02-27 DIAGNOSIS — S06340S Traumatic hemorrhage of right cerebrum without loss of consciousness, sequela: Secondary | ICD-10-CM | POA: Diagnosis not present

## 2021-02-27 LAB — MAGNESIUM: Magnesium: 1.7 mg/dL (ref 1.7–2.4)

## 2021-02-27 LAB — COMPREHENSIVE METABOLIC PANEL
ALT: 14 U/L (ref 0–44)
AST: 21 U/L (ref 15–41)
Albumin: 3.4 g/dL — ABNORMAL LOW (ref 3.5–5.0)
Alkaline Phosphatase: 69 U/L (ref 38–126)
Anion gap: 14 (ref 5–15)
BUN: 18 mg/dL (ref 8–23)
CO2: 17 mmol/L — ABNORMAL LOW (ref 22–32)
Calcium: 9.3 mg/dL (ref 8.9–10.3)
Chloride: 105 mmol/L (ref 98–111)
Creatinine, Ser: 0.96 mg/dL (ref 0.44–1.00)
GFR, Estimated: >60 mL/min (ref >60)
Glucose, Bld: 98 mg/dL (ref 70–99)
Potassium: 3.3 mmol/L — ABNORMAL LOW (ref 3.5–5.1)
Sodium: 136 mmol/L (ref 135–145)
Total Bilirubin: 1.5 mg/dL — ABNORMAL HIGH (ref 0.3–1.2)
Total Protein: 6.9 g/dL (ref 6.5–8.1)

## 2021-02-27 LAB — CBC WITH DIFFERENTIAL/PLATELET
Abs Immature Granulocytes: 0.03 10*3/uL (ref 0.00–0.07)
Basophils Absolute: 0 10*3/uL (ref 0.0–0.1)
Basophils Relative: 0 %
Eosinophils Absolute: 0.1 10*3/uL (ref 0.0–0.5)
Eosinophils Relative: 1 %
HCT: 27.9 % — ABNORMAL LOW (ref 36.0–46.0)
Hemoglobin: 9.3 g/dL — ABNORMAL LOW (ref 12.0–15.0)
Immature Granulocytes: 0 %
Lymphocytes Relative: 16 %
Lymphs Abs: 1.5 10*3/uL (ref 0.7–4.0)
MCH: 33 pg (ref 26.0–34.0)
MCHC: 33.3 g/dL (ref 30.0–36.0)
MCV: 98.9 fL (ref 80.0–100.0)
Monocytes Absolute: 0.9 10*3/uL (ref 0.1–1.0)
Monocytes Relative: 10 %
Neutro Abs: 6.7 10*3/uL (ref 1.7–7.7)
Neutrophils Relative %: 73 %
Platelets: 296 10*3/uL (ref 150–400)
RBC: 2.82 MIL/uL — ABNORMAL LOW (ref 3.87–5.11)
RDW: 13.3 % (ref 11.5–15.5)
WBC: 9.1 10*3/uL (ref 4.0–10.5)
nRBC: 0 % (ref 0.0–0.2)

## 2021-02-27 LAB — GLUCOSE, CAPILLARY: Glucose-Capillary: 90 mg/dL (ref 70–99)

## 2021-02-27 LAB — BRAIN NATRIURETIC PEPTIDE: B Natriuretic Peptide: 327 pg/mL — ABNORMAL HIGH (ref 0.0–100.0)

## 2021-02-27 MED ORDER — MAGNESIUM OXIDE -MG SUPPLEMENT 400 (240 MG) MG PO TABS
400.0000 mg | ORAL_TABLET | Freq: Two times a day (BID) | ORAL | Status: DC
  Administered 2021-02-27 – 2021-03-25 (×52): 400 mg via ORAL
  Filled 2021-02-27 (×53): qty 1

## 2021-02-27 MED ORDER — POLYETHYLENE GLYCOL 3350 17 G PO PACK: 17.0000 g | PACK | Freq: Every day | ORAL | Status: DC | PRN

## 2021-02-27 MED ORDER — PROCHLORPERAZINE MALEATE 5 MG PO TABS
5.0000 mg | ORAL_TABLET | Freq: Four times a day (QID) | ORAL | Status: DC | PRN
  Administered 2021-03-11 – 2021-03-12 (×2): 10 mg via ORAL
  Filled 2021-02-27 (×2): qty 2

## 2021-02-27 MED ORDER — PROCHLORPERAZINE EDISYLATE 10 MG/2ML IJ SOLN: 5.0000 mg | Freq: Four times a day (QID) | INTRAMUSCULAR | Status: DC | PRN

## 2021-02-27 MED ORDER — GUAIFENESIN-DM 100-10 MG/5ML PO SYRP: 5.0000 mL | ORAL_SOLUTION | Freq: Four times a day (QID) | ORAL | Status: DC | PRN

## 2021-02-27 MED ORDER — FLEET ENEMA 7-19 GM/118ML RE ENEM: 1.0000 | ENEMA | Freq: Once | RECTAL | Status: DC | PRN

## 2021-02-27 MED ORDER — ALPRAZOLAM 0.5 MG PO TABS
1.0000 mg | ORAL_TABLET | Freq: Three times a day (TID) | ORAL | Status: DC
  Administered 2021-02-27 – 2021-03-25 (×76): 1 mg via ORAL
  Filled 2021-02-27 (×3): qty 4
  Filled 2021-02-27: qty 2
  Filled 2021-02-27 (×3): qty 4
  Filled 2021-02-27: qty 2
  Filled 2021-02-27: qty 4
  Filled 2021-02-27 (×2): qty 2
  Filled 2021-02-27 (×3): qty 4
  Filled 2021-02-27 (×2): qty 2
  Filled 2021-02-27 (×6): qty 4
  Filled 2021-02-27: qty 2
  Filled 2021-02-27 (×4): qty 4
  Filled 2021-02-27: qty 2
  Filled 2021-02-27 (×2): qty 4
  Filled 2021-02-27 (×2): qty 2
  Filled 2021-02-27: qty 4
  Filled 2021-02-27: qty 2
  Filled 2021-02-27 (×2): qty 4
  Filled 2021-02-27 (×4): qty 2
  Filled 2021-02-27: qty 4
  Filled 2021-02-27: qty 2
  Filled 2021-02-27: qty 4
  Filled 2021-02-27: qty 2
  Filled 2021-02-27 (×5): qty 4
  Filled 2021-02-27: qty 2
  Filled 2021-02-27 (×2): qty 4
  Filled 2021-02-27: qty 2
  Filled 2021-02-27 (×3): qty 4
  Filled 2021-02-27: qty 2
  Filled 2021-02-27 (×5): qty 4
  Filled 2021-02-27: qty 2
  Filled 2021-02-27 (×7): qty 4
  Filled 2021-02-27: qty 2
  Filled 2021-02-27: qty 4
  Filled 2021-02-27: qty 2
  Filled 2021-02-27 (×2): qty 4
  Filled 2021-02-27: qty 2
  Filled 2021-02-27: qty 4

## 2021-02-27 MED ORDER — ALPRAZOLAM 0.5 MG PO TABS
1.0000 mg | ORAL_TABLET | Freq: Three times a day (TID) | ORAL | Status: DC
  Administered 2021-02-27: 1 mg via ORAL
  Filled 2021-02-27: qty 2

## 2021-02-27 MED ORDER — DIPHENHYDRAMINE HCL 12.5 MG/5ML PO ELIX: 12.5000 mg | ORAL_SOLUTION | Freq: Four times a day (QID) | ORAL | Status: DC | PRN

## 2021-02-27 MED ORDER — POTASSIUM CHLORIDE CRYS ER 20 MEQ PO TBCR
20.0000 meq | EXTENDED_RELEASE_TABLET | Freq: Two times a day (BID) | ORAL | Status: DC
  Administered 2021-02-27 – 2021-03-10 (×22): 20 meq via ORAL
  Filled 2021-02-27 (×22): qty 1

## 2021-02-27 MED ORDER — CHLORHEXIDINE GLUCONATE 0.12% ORAL RINSE (MEDLINE KIT)
15.0000 mL | Freq: Two times a day (BID) | OROMUCOSAL | Status: DC
  Administered 2021-02-27 – 2021-03-24 (×32): 15 mL via OROMUCOSAL

## 2021-02-27 MED ORDER — TRAZODONE HCL 50 MG PO TABS
25.0000 mg | ORAL_TABLET | Freq: Every evening | ORAL | Status: DC | PRN
  Administered 2021-03-01 – 2021-03-04 (×3): 50 mg via ORAL
  Filled 2021-02-27 (×6): qty 1

## 2021-02-27 MED ORDER — FOLIC ACID 1 MG PO TABS
1.0000 mg | ORAL_TABLET | Freq: Every day | ORAL | Status: DC
  Administered 2021-02-28 – 2021-03-25 (×26): 1 mg via ORAL
  Filled 2021-02-27 (×26): qty 1

## 2021-02-27 MED ORDER — PROCHLORPERAZINE 25 MG RE SUPP: 12.5000 mg | Freq: Four times a day (QID) | RECTAL | Status: DC | PRN

## 2021-02-27 MED ORDER — BISACODYL 10 MG RE SUPP: 10.0000 mg | Freq: Every day | RECTAL | Status: DC | PRN

## 2021-02-27 MED ORDER — OXYCODONE-ACETAMINOPHEN 5-325 MG PO TABS
1.0000 | ORAL_TABLET | ORAL | Status: DC | PRN
Start: 2021-02-27 — End: 2021-03-25
  Administered 2021-03-01 – 2021-03-10 (×17): 2 via ORAL
  Administered 2021-03-11: 1 via ORAL
  Administered 2021-03-11: 2 via ORAL
  Administered 2021-03-11 – 2021-03-12 (×2): 1 via ORAL
  Administered 2021-03-13: 2 via ORAL
  Administered 2021-03-13: 1 via ORAL
  Administered 2021-03-14: 2 via ORAL
  Administered 2021-03-14: 1 via ORAL
  Administered 2021-03-14 – 2021-03-16 (×5): 2 via ORAL
  Administered 2021-03-16: 1 via ORAL
  Administered 2021-03-16 – 2021-03-18 (×4): 2 via ORAL
  Administered 2021-03-19: 1 via ORAL
  Administered 2021-03-19: 2 via ORAL
  Administered 2021-03-19: 1 via ORAL
  Administered 2021-03-20: 2 via ORAL
  Administered 2021-03-20 – 2021-03-21 (×2): 1 via ORAL
  Administered 2021-03-21 – 2021-03-22 (×2): 2 via ORAL
  Administered 2021-03-22 (×2): 1 via ORAL
  Administered 2021-03-22 – 2021-03-23 (×2): 2 via ORAL
  Administered 2021-03-23: 1 via ORAL
  Administered 2021-03-23 – 2021-03-24 (×4): 2 via ORAL
  Administered 2021-03-25: 1 via ORAL
  Filled 2021-02-27: qty 1
  Filled 2021-02-27: qty 2
  Filled 2021-02-27: qty 1
  Filled 2021-02-27 (×5): qty 2
  Filled 2021-02-27: qty 1
  Filled 2021-02-27 (×4): qty 2
  Filled 2021-02-27: qty 1
  Filled 2021-02-27 (×5): qty 2
  Filled 2021-02-27: qty 1
  Filled 2021-02-27 (×4): qty 2
  Filled 2021-02-27: qty 1
  Filled 2021-02-27 (×4): qty 2
  Filled 2021-02-27: qty 1
  Filled 2021-02-27 (×2): qty 2
  Filled 2021-02-27: qty 1
  Filled 2021-02-27 (×2): qty 2
  Filled 2021-02-27 (×2): qty 1
  Filled 2021-02-27 (×2): qty 2
  Filled 2021-02-27: qty 1
  Filled 2021-02-27 (×4): qty 2
  Filled 2021-02-27: qty 1
  Filled 2021-02-27: qty 2
  Filled 2021-02-27: qty 1
  Filled 2021-02-27 (×7): qty 2

## 2021-02-27 MED ORDER — ALUM & MAG HYDROXIDE-SIMETH 200-200-20 MG/5ML PO SUSP
30.0000 mL | ORAL | Status: DC | PRN
  Administered 2021-03-15: 30 mL via ORAL
  Filled 2021-02-27: qty 30

## 2021-02-27 MED ORDER — UMECLIDINIUM-VILANTEROL 62.5-25 MCG/INH IN AEPB
1.0000 | INHALATION_SPRAY | Freq: Every day | RESPIRATORY_TRACT | Status: DC
  Administered 2021-02-28 – 2021-03-25 (×21): 1 via RESPIRATORY_TRACT
  Filled 2021-02-27 (×3): qty 14

## 2021-02-27 MED ORDER — SENNOSIDES-DOCUSATE SODIUM 8.6-50 MG PO TABS: 2.0000 | ORAL_TABLET | Freq: Every day | ORAL | Status: DC

## 2021-02-27 MED ORDER — ACETAMINOPHEN 325 MG PO TABS
325.0000 mg | ORAL_TABLET | ORAL | Status: DC | PRN
  Administered 2021-02-27 – 2021-03-09 (×11): 650 mg via ORAL
  Filled 2021-02-27 (×12): qty 2

## 2021-02-27 MED ORDER — JUVEN PO PACK
1.0000 | PACK | Freq: Two times a day (BID) | ORAL | Status: DC
  Administered 2021-02-28 – 2021-03-05 (×11): 1 via ORAL
  Filled 2021-02-27 (×10): qty 1

## 2021-02-27 MED ORDER — LISINOPRIL 10 MG PO TABS: 10.0000 mg | ORAL_TABLET | Freq: Every day | ORAL | Status: DC

## 2021-02-27 MED ORDER — PROMETHAZINE HCL 12.5 MG PO TABS: 12.5000 mg | ORAL_TABLET | Freq: Four times a day (QID) | ORAL | Status: DC | PRN

## 2021-02-27 MED ORDER — LISINOPRIL 10 MG PO TABS
10.0000 mg | ORAL_TABLET | Freq: Every day | ORAL | Status: DC
  Administered 2021-02-28 – 2021-03-08 (×8): 10 mg via ORAL
  Filled 2021-02-27 (×9): qty 1

## 2021-02-27 MED ORDER — HYDRALAZINE HCL 10 MG PO TABS
10.0000 mg | ORAL_TABLET | Freq: Four times a day (QID) | ORAL | Status: DC | PRN
  Administered 2021-02-28: 10 mg via ORAL
  Filled 2021-02-27: qty 1

## 2021-02-27 MED ORDER — LEVETIRACETAM 500 MG PO TABS
500.0000 mg | ORAL_TABLET | Freq: Two times a day (BID) | ORAL | Status: DC
  Administered 2021-02-27 – 2021-03-03 (×8): 500 mg via ORAL
  Filled 2021-02-27 (×8): qty 1

## 2021-02-27 MED FILL — Fentanyl Citrate Preservative Free (PF) Inj 100 MCG/2ML: INTRAMUSCULAR | Qty: 1 | Status: AC

## 2021-02-27 NOTE — Progress Notes (Signed)
INPATIENT REHABILITATION ADMISSION NOTE   Arrival Method: bed      Mental Orientation: A/Ox2   Assessment: completed    Skin: intact   IV'S: Right AC   Pain: 7/10    Tubes and Drains: none   Safety Measures: call light within reach, bed alarms arm.    Vital Signs: completed    Height and Weight: Weight 68.1kg HT: 5'4   Rehab Orientation: given by Pearline Cables LPN   Family: notified of pts arrival on unit   [place]

## 2021-02-27 NOTE — Progress Notes (Signed)
PMR Admission Coordinator Pre-Admission Assessment   Patient: Tonya [name] is an 75 y.o., female MRN: [id number] DOB: 11-28-1945 Height: _0  (162.6 cm) Weight: 62.4 kg   Insurance Information HMO: yes    PPO:      PCP:      IPA:      80/20:      OTHER: PRIMARY: [employer]      Policy#: [id number]      Subscriber: pt CM Name: Cora Collum      Phone#: [phone] ext [phone]     Fax#: [phone] Pre-Cert#: [id number] auth for CIR provided by Cora Collum at [facility] with updates due to [place] at fax listed abov eon 6/29 (ext [phone] Edwena Felty)      Employer: Benefits:  Phone #: [phone]     Name: Irene Shipper. Date: 09/08/19     Deduct: $0      Out of Pocket Max: $3900 (met $3900)      Life Max: n/a CIR: $295/day for days 1-5 until OOPM Met      SNF: 20 full days Outpatient: $10-$20/visit     Co-Pay: Home Health: 100%      Co-Pay: DME: 80%     Co-Ins: 20% Providers:  SECONDARY:       Policy#:      Phone#:   Development worker, community:       Phone#:   The Therapist, art Information Summary" for patients in Inpatient Rehabilitation Facilities with attached "Privacy Act [place] Records" was provided and verbally reviewed with: Patient and Family   Emergency Contact Information Contact Information       Name Relation Home Work Mobile    Hadar Spouse [phone]   [place] Daughter     [phone]    [facility] [place] [facility] Daughter     [phone]           Current Medical History  Patient Admitting Diagnosis: ICH    History of Present Illness: Pt is a 75 y/o female with PMH of stage II lung cancer, s/p R upper lobectomy, anxiety, and GERD admitted to osh and transferred to [facility] on 6/18 with AMS.  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  Pt required intubation in the ICU for respiratory failure.  Stroke etiology unclear.  Venogram negative, MRA- head neck and MRI brain negative for vascular malformations/mets. . TTE  without evidence for embolic source.  Recommend repeat MRI in 2-3 months.  Therapy evaluations were completed and pt was recommended for CIR.     Complete NIHSS TOTAL: (P) 6   [facility]'s [facility] record from [facility] has been reviewed by the rehabilitation admission coordinator and physician.   Past Medical History      Past Medical History:  Diagnosis Date   Adenoma of left adrenal gland     Cancer (HCC)      Lung   Colon polyps      hyperplastic   Complication of anesthesia      Very emotional and cries after anesthesia   GERD (gastroesophageal reflux disease)     Kidney stone     Liver lesion     Microhematuria        Family History   family history includes Arrhythmia in her mother; Heart attack in her father; Heart disease in her sister.   Prior Rehab/Hospitalizations Has the patient had prior rehab or hospitalizations prior to admission? No   Has the patient had major surgery during 100 days prior to  admission? No               Current Medications   Current Facility-Administered Medications:   acetaminophen (TYLENOL) tablet 650 mg, 650 mg, Oral, Q4H PRN, 650 mg at 02/27/21 0011 **OR** acetaminophen (TYLENOL) 160 MG/5ML solution 650 mg, 650 mg, Per Tube, Q4H PRN, 650 mg at 02/24/21 0055 **OR** acetaminophen (TYLENOL) suppository 650 mg, 650 mg, Rectal, Q4H PRN, Theda Sers, Unk Pinto, MD   ALPRAZolam Duanne Moron) tablet 1 mg, 1 mg, Oral, TID, Mariel Aloe, MD, 1 mg at 02/27/21 1200   chlorhexidine gluconate (MEDLINE KIT) (PERIDEX) 0.12 % solution 15 mL, 15 mL, Mouth Rinse, BID, Gwinda Maine, MD, 15 mL at 02/27/21 0849   Chlorhexidine Gluconate Cloth 2 % PADS 6 each, 6 each, Topical, Daily, Gwinda Maine, MD, 6 each at 16/10/96 0454   folic acid (FOLVITE) tablet 1 mg, 1 mg, Oral, Daily, Heard, Courtney S, NP, 1 mg at 02/27/21 0848   labetalol (NORMODYNE) injection 10 mg, 10 mg, Intravenous, Q10 min PRN, Greta Doom, MD, 10 mg at 02/27/21 0012   lisinopril  (ZESTRIL) tablet 10 mg, 10 mg, Oral, Daily, Mariel Aloe, MD, 10 mg at 02/27/21 0848   metoprolol tartrate (LOPRESSOR) injection 2.5-5 mg, 2.5-5 mg, Intravenous, Q3H PRN, Bowser, Laurel Dimmer, NP, 5 mg at 02/26/21 1619   oxyCODONE-acetaminophen (PERCOCET/ROXICET) 5-325 MG per tablet 1-2 tablet, 1-2 tablet, Oral, Q4H PRN, Garvin Fila, MD, 2 tablet at 02/27/21 0301   promethazine (PHENERGAN) 12.5 mg in sodium chloride 0.9 % 50 mL IVPB, 12.5 mg, Intravenous, Q6H PRN, Mariel Aloe, MD, Last Rate: 200 mL/hr at 02/27/21 0402, 12.5 mg at 02/27/21 0402   senna-docusate (Senokot-S) tablet 1 tablet, 1 tablet, Oral, BID, Garvin Fila, MD, 1 tablet at 02/27/21 0848   umeclidinium-vilanterol (ANORO ELLIPTA) 62.5-25 MCG/INH 1 puff, 1 puff, Inhalation, Daily, Julian Hy, DO, 1 puff at 02/26/21 0981   Patients Current Diet:  Diet Order                  Diet heart healthy/carb modified Room service appropriate? Yes with Assist; Fluid consistency: Thin  Diet effective now                         Precautions / Restrictions Precautions Precautions: Fall Precaution Comments: L inattention Restrictions Weight Bearing Restrictions: No    Has the patient had 2 or more falls or a fall with injury in the past year? No   Prior Activity Level Community (5-7x/wk): fully independent prior to admit, no DME used, driving, shopping, etc.   Prior Functional Level Self Care: Did the patient need help bathing, dressing, using the toilet or eating? Independent   Indoor Mobility: Did the patient need assistance with walking from room to room (with or without device)? Independent   Stairs: Did the patient need assistance with internal or external stairs (with or without device)? Independent   Functional Cognition: Did the patient need help planning regular tasks such as shopping or remembering to take medications? Independent   Home Assistive Devices / Equipment Home Equipment: Grab bars - tub/shower,  Shower seat, Hand held shower head   Prior Device Use: Indicate devices/aids used by the patient prior to current illness, exacerbation or injury? None of the above   Current Functional Level Cognition   Arousal/Alertness: Awake/alert (drowsy at times) Overall Cognitive Status: Impaired/Different from baseline Current Attention Level: Focused Orientation Level: Oriented X4 Following Commands:  Follows one step commands with increased time, Follows one step commands consistently, Follows multi-step commands inconsistently Safety/Judgement: Decreased awareness of safety, Decreased awareness of deficits General Comments: Pt slow to process commands, but follows single step commands consistently. Pt with L inattention, needing repeated multi-modal cues to attend to L side and find and maintain midline. Pt forgetful of cues to not scoot inferiorly in recliner, thus poor safety awareness and memory deficits. Poor sequencing. Of note, after leaving room several minutes pt had again scooted down almost out of chair (3x total noted this date), thus pt returned to bed to maintain her safety. Attention: Sustained Sustained Attention: Impaired Sustained Attention Impairment: Verbal basic, Functional basic Memory: Impaired Memory Impairment: Storage deficit, Retrieval deficit Awareness: Impaired Awareness Impairment: Emergent impairment, Anticipatory impairment, Intellectual impairment Problem Solving: Impaired Problem Solving Impairment: Verbal basic Executive Function: Sequencing, Reasoning, Organizing Behaviors: Other (comment) (stated she was a little depressed) Safety/Judgment: Impaired    Extremity Assessment (includes Sensation/Coordination)   Upper Extremity Assessment: Generalized weakness, LUE deficits/detail, RUE deficits/detail RUE Deficits / Details: 2-/5 strength, AROM shoulder through digits, WFLs; poor grip strength RUE Coordination: decreased fine motor, decreased gross motor LUE  Deficits / Details: 0/5 strength, no AROM noted LUE Sensation: decreased light touch LUE Coordination: decreased fine motor, decreased gross motor  Lower Extremity Assessment: Defer to PT evaluation RLE Deficits / Details: Pt able to move against gravity, difficulty coordinating movements and grading force needed RLE Coordination: decreased fine motor, decreased gross motor LLE Deficits / Details: pt with inattention to LLE, able to move with direct multimodal cues. grossly 3-/5 LLE Sensation: decreased light touch (pt reports LLE "feels different" then stated it felt "cool") LLE Coordination: decreased fine motor, decreased gross motor     ADLs   Overall ADL's : Needs assistance/impaired Eating/Feeding: NPO Grooming: Minimal assistance, Bed level Grooming Details (indicate cue type and reason): with RUE Upper Body Bathing: Moderate assistance Lower Body Bathing: Maximal assistance Upper Body Dressing : Moderate assistance Lower Body Dressing: Bed level, Maximal assistance Lower Body Dressing Details (indicate cue type and reason): figure 4 for RLE Toilet Transfer: Maximal assistance, +2 for physical assistance, +2 for safety/equipment, Squat-pivot, BSC, Total assistance Toilet Transfer Details (indicate cue type and reason): totalA +1; maxA+2 Toileting- Clothing Manipulation and Hygiene: Total assistance Toileting - Clothing Manipulation Details (indicate cue type and reason): BM, able to "stand" for 30 secs Functional mobility during ADLs: Maximal assistance, +2 for physical assistance, +2 for safety/equipment, Total assistance, Cueing for safety, Cueing for sequencing General ADL Comments: Pt limited by decreased strength, L inattention, decreased mobility and decreased ability to care for self. Pt reports that she wears glasses, but does not have them here with her.     Mobility   Overal bed mobility: Needs Assistance Bed Mobility: Supine to Sit Supine to sit: Min assist, HOB  elevated Sit to supine: Mod assist General bed mobility comments: MinA to manage L leg and trunk to sit EOB. Pt needing cues to flex L knee as she tends to keep it extended. Cues to square hips with EOB.     Transfers   Overall transfer level: Needs assistance Equipment used: 2 person hand held assist, Rolling walker (2 wheeled) Transfers: Sit to/from Stand, Stand Pivot Transfers Sit to Stand: Mod assist, +2 physical assistance, +2 safety/equipment Stand pivot transfers: Max assist, +2 physical assistance, +2 safety/equipment General transfer comment: Sit to stand 1x from EOB, 1x from commode, and 1x from recliner. Faded feddback provided with each rep  in regards to cuing pt for proper feet placement. Pt needs cues to look at her bil feet and match her L to her R along with physical assistance. cues to push up from current sitting surface and to place L hand on RW. MaxAx2 to stand step to L bed > commode 1x and recliner > bed 1x, providing L knee block during stance and blocking strong L lean.     Ambulation / Gait / Stairs / Wheelchair Mobility   Ambulation/Gait Ambulation/Gait assistance: Max assist, +2 physical assistance, +2 safety/equipment Gait Distance (Feet): 2 Feet Assistive device: 2 person hand held assist, Rolling walker (2 wheeled) Gait Pattern/deviations: Step-to pattern, Decreased stance time - right, Decreased stance time - left, Decreased step length - left, Decreased step length - right, Decreased stride length, Decreased dorsiflexion - left, Decreased weight shift to right, Trunk flexed, Narrow base of support General Gait Details: Stand step transfers towards L 2x with UE support and another gait bout with RW performed standing anterior to recliner, cuing pt to step anterior <> posterior 3x each foot. L knee block during L stance with cues at L posterior thigh to advance leg during step phase. Cues provided to use vision to find midline to decrease her L lateral lean. Improved R  weight shift with subsequent reps. Gait velocity: reduced Gait velocity interpretation: <1.31 ft/sec, indicative of household ambulator     Posture / Balance Dynamic Sitting Balance Sitting balance - Comments: Static sitting EOB with min guard. Balance Overall balance assessment: Needs assistance Sitting-balance support: No upper extremity supported, Feet supported Sitting balance-Leahy Scale: Fair Sitting balance - Comments: Static sitting EOB with min guard. Postural control: Left lateral lean Standing balance support: Bilateral upper extremity supported, During functional activity Standing balance-Leahy Scale: Poor Standing balance comment: Bil UE support and mod-maxAx2 to stand. Cued pt to reach off BOS minimally with L UE to perform pericare while R UE was supported on RW. Pt with strong L lateral lean, needing cues to look at either side and find midline between 2 objects, fair carryover.     Special needs/care consideration N/a    Previous Home Environment (from acute therapy documentation) Living Arrangements: Spouse/significant other Available Help at Discharge: Family, Available 24 hours/day Type of Home: [place]: One level Home Access: Level entry Bathroom Shower/Tub: Multimedia programmer: [place]: No   Discharge Living Setting Plans for Discharge Living Setting: Patient's home, Lives with (comment) (spouse) Type of Home at Discharge: House Discharge Home Layout: One level Discharge Home Access: Stairs to enter Entrance Stairs-Rails: Right Entrance Stairs-Number of Steps: 3 Discharge Bathroom Shower/Tub: Walk-in shower Discharge Bathroom Toilet: Standard Discharge Bathroom Accessibility: Yes How Accessible: Accessible via walker Does the patient have any problems obtaining your medications?: No   Social/Family/Support Systems Patient Roles: Spouse Contact Information: Cassell Smiles [phone] Anticipated  Caregiver: [name] and his daughters Lenna Sciara and lori) Anticipated Ambulance person Information: Lenna Sciara [phone]; Cecille Rubin [phone] Ability/Limitations of Caregiver: min assist Caregiver Availability: 24/7 Discharge Plan Discussed with Primary Caregiver: Yes Is Caregiver In Agreement with Plan?: Yes Does Caregiver/Family have Issues with Lodging/Transportation while Pt is in Rehab?: No   Goals Patient/Family Goal for Rehab: PT/OT min assist, possibly w/c level, SLP supervision to min assist Expected length of stay: 28-30 days Pt/Family Agrees to Admission and willing to participate: Yes Program Orientation Provided & Reviewed with Pt/Caregiver Including Roles  & Responsibilities: Yes  Barriers to Discharge: Insurance for SNF coverage   Decrease burden of  Care through IP rehab admission: n/a   Possible need for SNF placement upon discharge: Not anticipated.  Pt with good family support and aware she cannot transition to SNF from CIR.   Patient Condition: I have reviewed medical records from [facility], spoken with CM, and patient, spouse, and daughter. I met with patient at the bedside for inpatient rehabilitation assessment.  Patient will benefit from ongoing PT, OT, and SLP, can actively participate in 3 hours of therapy a day 5 days of the week, and can make measurable gains during the admission.  Patient will also benefit from the coordinated team approach during an Inpatient Acute Rehabilitation admission.  The patient will receive intensive therapy as well as Rehabilitation physician, nursing, social worker, and care management interventions.  Due to bladder management, bowel management, safety, skin/wound care, disease management, medication administration, pain management, and patient education the patient requires 24 hour a day rehabilitation nursing.  The patient is currently max +2 with mobility and basic ADLs.  Discharge setting and therapy post discharge at home with home  health is anticipated.  Patient has agreed to participate in the Acute Inpatient Rehabilitation Program and will admit today.   Preadmission Screen Completed By:  Michel Santee, PT, DPT 02/27/2021 1:35 PM ______________________________________________________________________   Discussed status with Dr. Ranell Patrick on 02/27/21  at .now  and received approval for admission today.   Admission Coordinator:  Michel Santee, PT, DPT time 1:43 PM Sudie Grumbling 02/27/21    Assessment/Plan: Diagnosis: ICH Does the need for close, 24 hr/day Medical supervision in concert with the patient's rehab needs make it unreasonable for this patient to be served in a less intensive setting? Yes Co-Morbidities requiring supervision/potential complications:  Hallucinations/confusion Hypokalemia Hypomagnesemia Anxiety HLD Due to bladder management, bowel management, safety, skin/wound care, disease management, medication administration, pain management, and patient education, does the patient require 24 hr/day rehab nursing? Yes Does the patient require coordinated care of a physician, rehab nurse, PT, OT, and SLP to address physical and functional deficits in the context of the above medical diagnosis(es)? Yes Addressing deficits in the following areas: balance, endurance, locomotion, strength, transferring, bowel/bladder control, bathing, dressing, feeding, grooming, toileting, cognition, and psychosocial support Can the patient actively participate in an intensive therapy program of at least 3 hrs of therapy 5 days a week? Yes The potential for patient to make measurable gains while on inpatient rehab is excellent Anticipated functional outcomes upon discharge from inpatient rehab: supervision PT, supervision OT, supervision SLP Estimated rehab length of stay to reach the above functional goals is: 2-3 weeks Anticipated discharge destination: Home 10. Overall Rehab/Functional Prognosis: excellent     MD Signature:

## 2021-02-27 NOTE — Plan of Care (Signed)
  Problem: Health Behavior/Discharge Planning: Goal: Ability to manage health-related needs will improve Outcome: Progressing   Problem: Nutrition: Goal: Risk of aspiration will decrease Outcome: Progressing Goal: Dietary intake will improve Outcome: Progressing

## 2021-02-27 NOTE — PMR Pre-admission (Signed)
PMR Admission Coordinator Pre-Admission Assessment  Patient: Tonya [name] is an 75 y.o., female MRN: [id number] DOB: 04-23-1946 Height: 5\' 4"  (162.6 cm) Weight: 62.4 kg  Insurance Information HMO: yes    PPO:      PCP:      IPA:      80/20:      OTHER:  PRIMARY: [employer]      Policy#: [id number]      Subscriber: pt CM Name: Cora Collum      Phone#: [phone] ext [phone]     Fax#: [phone] Pre-Cert#: [id number] auth for CIR provided by Cora Collum at [facility] with updates due to [place] at fax listed abov eon 6/29 (ext [phone] Edwena Felty)      Employer:  Benefits:  Phone #: [phone]     Name:  Irene Shipper. Date: 09/08/19     Deduct: $0      Out of Pocket Max: $3900 (met $3900)      Life Max: n/a CIR: $295/day for days 1-5 until OOPM Met      SNF: 20 full days Outpatient: $10-$20/visit     Co-Pay:  Home Health: 100%      Co-Pay:  DME: 80%     Co-Ins: 20% Providers:  SECONDARY:       Policy#:      Phone#:   Development worker, community:       Phone#:   The Therapist, art Information Summary" for patients in Inpatient Rehabilitation Facilities with attached "Privacy Act [place] Records" was provided and verbally reviewed with: Patient and Family  Emergency Contact Information Contact Information     Name Relation Home Work Mobile   [place] Spouse [phone]  [place] Daughter   [phone]   [facility] Daughter   [phone]       Current Medical History  Patient Admitting Diagnosis: ICH   History of Present Illness: Pt is a 75 y/o female with PMH of stage II lung cancer, s/p R upper lobectomy, anxiety, and GERD admitted to osh and transferred to [facility] on 6/18 with AMS.  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  Pt required intubation in the ICU for respiratory failure.  Stroke etiology unclear.  Venogram negative, MRA- head neck and MRI brain negative for vascular malformations/mets. . TTE without  evidence for embolic source.  Recommend repeat MRI in 2-3 months.  Therapy evaluations were completed and pt was recommended for CIR.    Complete NIHSS TOTAL: (P) 6  [facility]'s medical record from [facility] has been reviewed by the rehabilitation admission coordinator and physician.  Past Medical History  Past Medical History:  Diagnosis Date   Adenoma of left adrenal gland    Cancer (HCC)    Lung   Colon polyps    hyperplastic   Complication of anesthesia    Very emotional and cries after anesthesia   GERD (gastroesophageal reflux disease)    Kidney stone    Liver lesion    Microhematuria     Family History   family history includes Arrhythmia in her mother; Heart attack in her father; Heart disease in her sister.  Prior Rehab/Hospitalizations Has the patient had prior rehab or hospitalizations prior to admission? No  Has the patient had major surgery during 100 days prior to admission? No   Current Medications  Current Facility-Administered Medications:    acetaminophen (TYLENOL) tablet 650 mg, 650 mg, Oral, Q4H PRN, 650 mg at 02/27/21 0011 **OR** acetaminophen (TYLENOL) 160 MG/5ML solution 650  mg, 650 mg, Per Tube, Q4H PRN, 650 mg at 02/24/21 0055 **OR** acetaminophen (TYLENOL) suppository 650 mg, 650 mg, Rectal, Q4H PRN, Theda Sers, Unk Pinto, MD   ALPRAZolam Duanne Moron) tablet 1 mg, 1 mg, Oral, TID, Mariel Aloe, MD, 1 mg at 02/27/21 1200   chlorhexidine gluconate (MEDLINE KIT) (PERIDEX) 0.12 % solution 15 mL, 15 mL, Mouth Rinse, BID, Gwinda Maine, MD, 15 mL at 02/27/21 0849   Chlorhexidine Gluconate Cloth 2 % PADS 6 each, 6 each, Topical, Daily, Gwinda Maine, MD, 6 each at 21/97/58 8325   folic acid (FOLVITE) tablet 1 mg, 1 mg, Oral, Daily, Heard, Courtney S, NP, 1 mg at 02/27/21 0848   labetalol (NORMODYNE) injection 10 mg, 10 mg, Intravenous, Q10 min PRN, Greta Doom, MD, 10 mg at 02/27/21 0012   lisinopril (ZESTRIL) tablet 10 mg, 10 mg, Oral, Daily,  Mariel Aloe, MD, 10 mg at 02/27/21 0848   metoprolol tartrate (LOPRESSOR) injection 2.5-5 mg, 2.5-5 mg, Intravenous, Q3H PRN, Bowser, Laurel Dimmer, NP, 5 mg at 02/26/21 1619   oxyCODONE-acetaminophen (PERCOCET/ROXICET) 5-325 MG per tablet 1-2 tablet, 1-2 tablet, Oral, Q4H PRN, Garvin Fila, MD, 2 tablet at 02/27/21 0301   promethazine (PHENERGAN) 12.5 mg in sodium chloride 0.9 % 50 mL IVPB, 12.5 mg, Intravenous, Q6H PRN, Mariel Aloe, MD, Last Rate: 200 mL/hr at 02/27/21 0402, 12.5 mg at 02/27/21 0402   senna-docusate (Senokot-S) tablet 1 tablet, 1 tablet, Oral, BID, Garvin Fila, MD, 1 tablet at 02/27/21 0848   umeclidinium-vilanterol (ANORO ELLIPTA) 62.5-25 MCG/INH 1 puff, 1 puff, Inhalation, Daily, Julian Hy, DO, 1 puff at 02/26/21 4982  Patients Current Diet:  Diet Order             Diet heart healthy/carb modified Room service appropriate? Yes with Assist; Fluid consistency: Thin  Diet effective now                   Precautions / Restrictions Precautions Precautions: Fall Precaution Comments: L inattention Restrictions Weight Bearing Restrictions: No   Has the patient had 2 or more falls or a fall with injury in the past year? No  Prior Activity Level Community (5-7x/wk): fully independent prior to admit, no DME used, driving, shopping, etc.  Prior Functional Level Self Care: Did the patient need help bathing, dressing, using the toilet or eating? Independent  Indoor Mobility: Did the patient need assistance with walking from room to room (with or without device)? Independent  Stairs: Did the patient need assistance with internal or external stairs (with or without device)? Independent  Functional Cognition: Did the patient need help planning regular tasks such as shopping or remembering to take medications? Independent  Home Assistive Devices / Equipment Home Equipment: Grab bars - tub/shower, Shower seat, Hand held shower head  Prior Device Use:  Indicate devices/aids used by the patient prior to current illness, exacerbation or injury? None of the above  Current Functional Level Cognition  Arousal/Alertness: Awake/alert (drowsy at times) Overall Cognitive Status: Impaired/Different from baseline Current Attention Level: Focused Orientation Level: Oriented X4 Following Commands: Follows one step commands with increased time, Follows one step commands consistently, Follows multi-step commands inconsistently Safety/Judgement: Decreased awareness of safety, Decreased awareness of deficits General Comments: Pt slow to process commands, but follows single step commands consistently. Pt with L inattention, needing repeated multi-modal cues to attend to L side and find and maintain midline. Pt forgetful of cues to not scoot inferiorly in recliner, thus poor safety  awareness and memory deficits. Poor sequencing. Of note, after leaving room several minutes pt had again scooted down almost out of chair (3x total noted this date), thus pt returned to bed to maintain her safety. Attention: Sustained Sustained Attention: Impaired Sustained Attention Impairment: Verbal basic, Functional basic Memory: Impaired Memory Impairment: Storage deficit, Retrieval deficit Awareness: Impaired Awareness Impairment: Emergent impairment, Anticipatory impairment, Intellectual impairment Problem Solving: Impaired Problem Solving Impairment: Verbal basic Executive Function: Sequencing, Reasoning, Organizing Behaviors: Other (comment) (stated she was a little depressed) Safety/Judgment: Impaired    Extremity Assessment (includes Sensation/Coordination)  Upper Extremity Assessment: Generalized weakness, LUE deficits/detail, RUE deficits/detail RUE Deficits / Details: 2-/5 strength, AROM shoulder through digits, WFLs; poor grip strength RUE Coordination: decreased fine motor, decreased gross motor LUE Deficits / Details: 0/5 strength, no AROM noted LUE  Sensation: decreased light touch LUE Coordination: decreased fine motor, decreased gross motor  Lower Extremity Assessment: Defer to PT evaluation RLE Deficits / Details: Pt able to move against gravity, difficulty coordinating movements and grading force needed RLE Coordination: decreased fine motor, decreased gross motor LLE Deficits / Details: pt with inattention to LLE, able to move with direct multimodal cues. grossly 3-/5 LLE Sensation: decreased light touch (pt reports LLE "feels different" then stated it felt "cool") LLE Coordination: decreased fine motor, decreased gross motor    ADLs  Overall ADL's : Needs assistance/impaired Eating/Feeding: NPO Grooming: Minimal assistance, Bed level Grooming Details (indicate cue type and reason): with RUE Upper Body Bathing: Moderate assistance Lower Body Bathing: Maximal assistance Upper Body Dressing : Moderate assistance Lower Body Dressing: Bed level, Maximal assistance Lower Body Dressing Details (indicate cue type and reason): figure 4 for RLE Toilet Transfer: Maximal assistance, +2 for physical assistance, +2 for safety/equipment, Squat-pivot, BSC, Total assistance Toilet Transfer Details (indicate cue type and reason): totalA +1; maxA+2 Toileting- Clothing Manipulation and Hygiene: Total assistance Toileting - Clothing Manipulation Details (indicate cue type and reason): BM, able to "stand" for 30 secs Functional mobility during ADLs: Maximal assistance, +2 for physical assistance, +2 for safety/equipment, Total assistance, Cueing for safety, Cueing for sequencing General ADL Comments: Pt limited by decreased strength, L inattention, decreased mobility and decreased ability to care for self. Pt reports that she wears glasses, but does not have them here with her.    Mobility  Overal bed mobility: Needs Assistance Bed Mobility: Supine to Sit Supine to sit: Min assist, HOB elevated Sit to supine: Mod assist General bed mobility  comments: MinA to manage L leg and trunk to sit EOB. Pt needing cues to flex L knee as she tends to keep it extended. Cues to square hips with EOB.    Transfers  Overall transfer level: Needs assistance Equipment used: 2 person hand held assist, Rolling walker (2 wheeled) Transfers: Sit to/from Stand, Stand Pivot Transfers Sit to Stand: Mod assist, +2 physical assistance, +2 safety/equipment Stand pivot transfers: Max assist, +2 physical assistance, +2 safety/equipment General transfer comment: Sit to stand 1x from EOB, 1x from commode, and 1x from recliner. Faded feddback provided with each rep in regards to cuing pt for proper feet placement. Pt needs cues to look at her bil feet and match her L to her R along with physical assistance. cues to push up from current sitting surface and to place L hand on RW. MaxAx2 to stand step to L bed > commode 1x and recliner > bed 1x, providing L knee block during stance and blocking strong L lean.    Ambulation / Gait /  Stairs / Emergency planning/management officer  Ambulation/Gait Ambulation/Gait assistance: Max assist, +2 physical assistance, +2 safety/equipment Gait Distance (Feet): 2 Feet Assistive device: 2 person hand held assist, Rolling walker (2 wheeled) Gait Pattern/deviations: Step-to pattern, Decreased stance time - right, Decreased stance time - left, Decreased step length - left, Decreased step length - right, Decreased stride length, Decreased dorsiflexion - left, Decreased weight shift to right, Trunk flexed, Narrow base of support General Gait Details: Stand step transfers towards L 2x with UE support and another gait bout with RW performed standing anterior to recliner, cuing pt to step anterior <> posterior 3x each foot. L knee block during L stance with cues at L posterior thigh to advance leg during step phase. Cues provided to use vision to find midline to decrease her L lateral lean. Improved R weight shift with subsequent reps. Gait velocity:  reduced Gait velocity interpretation: <1.31 ft/sec, indicative of household ambulator    Posture / Balance Dynamic Sitting Balance Sitting balance - Comments: Static sitting EOB with min guard. Balance Overall balance assessment: Needs assistance Sitting-balance support: No upper extremity supported, Feet supported Sitting balance-Leahy Scale: Fair Sitting balance - Comments: Static sitting EOB with min guard. Postural control: Left lateral lean Standing balance support: Bilateral upper extremity supported, During functional activity Standing balance-Leahy Scale: Poor Standing balance comment: Bil UE support and mod-maxAx2 to stand. Cued pt to reach off BOS minimally with L UE to perform pericare while R UE was supported on RW. Pt with strong L lateral lean, needing cues to look at either side and find midline between 2 objects, fair carryover.    Special needs/care consideration N/a   Previous Home Environment (from acute therapy documentation) Living Arrangements: Spouse/significant other Available Help at Discharge: Family, Available 24 hours/day Type of Home: [place]: One level Home Access: Level entry Bathroom Shower/Tub: Multimedia programmer: [place]: No  Discharge Living Setting Plans for Discharge Living Setting: Patient's home, Lives with (comment) (spouse) Type of Home at Discharge: House Discharge Home Layout: One level Discharge Home Access: Stairs to enter Entrance Stairs-Rails: Right Entrance Stairs-Number of Steps: 3 Discharge Bathroom Shower/Tub: Walk-in shower Discharge Bathroom Toilet: Standard Discharge Bathroom Accessibility: Yes How Accessible: Accessible via walker Does the patient have any problems obtaining your medications?: No  Social/Family/Support Systems Patient Roles: Spouse Contact Information: Cassell Smiles [phone] Anticipated Caregiver: [name] and his daughters Lenna Sciara and  lori) Anticipated Ambulance person Information: Lenna Sciara [phone]; Cecille Rubin [phone] Ability/Limitations of Caregiver: min assist Caregiver Availability: 24/7 Discharge Plan Discussed with Primary Caregiver: Yes Is Caregiver In Agreement with Plan?: Yes Does Caregiver/Family have Issues with Lodging/Transportation while Pt is in Rehab?: No  Goals Patient/Family Goal for Rehab: PT/OT min assist, possibly w/c level, SLP supervision to min assist Expected length of stay: 28-30 days Pt/Family Agrees to Admission and willing to participate: Yes Program Orientation Provided & Reviewed with Pt/Caregiver Including Roles  & Responsibilities: Yes  Barriers to Discharge: Insurance for SNF coverage  Decrease burden of Care through IP rehab admission: n/a  Possible need for SNF placement upon discharge: Not anticipated.  Pt with good family support and aware she cannot transition to SNF from CIR.   Patient Condition: I have reviewed medical records from [facility], spoken with CM, and patient, spouse, and daughter. I met with patient at the bedside for inpatient rehabilitation assessment.  Patient will benefit from ongoing PT, OT, and SLP, can actively participate in 3 hours of therapy a day 5 days of the  week, and can make measurable gains during the admission.  Patient will also benefit from the coordinated team approach during an Inpatient Acute Rehabilitation admission.  The patient will receive intensive therapy as well as Rehabilitation physician, nursing, social worker, and care management interventions.  Due to bladder management, bowel management, safety, skin/wound care, disease management, medication administration, pain management, and patient education the patient requires 24 hour a day rehabilitation nursing.  The patient is currently max +2 with mobility and basic ADLs.  Discharge setting and therapy post discharge at home with home health is anticipated.  Patient has agreed to participate  in the Acute Inpatient Rehabilitation Program and will admit today.  Preadmission Screen Completed By:  Michel Santee, PT, DPT 02/27/2021 1:35 PM ______________________________________________________________________   Discussed status with Dr. Ranell Patrick on 02/27/21  at .now  and received approval for admission today.  Admission Coordinator:  Michel Santee, PT, DPT time 1:43 PM Sudie Grumbling 02/27/21   Assessment/Plan: Diagnosis: ICH Does the need for close, 24 hr/day Medical supervision in concert with the patient's rehab needs make it unreasonable for this patient to be served in a less intensive setting? Yes Co-Morbidities requiring supervision/potential complications:  Hallucinations/confusion Hypokalemia Hypomagnesemia Anxiety HLD Due to bladder management, bowel management, safety, skin/wound care, disease management, medication administration, pain management, and patient education, does the patient require 24 hr/day rehab nursing? Yes Does the patient require coordinated care of a physician, rehab nurse, PT, OT, and SLP to address physical and functional deficits in the context of the above medical diagnosis(es)? Yes Addressing deficits in the following areas: balance, endurance, locomotion, strength, transferring, bowel/bladder control, bathing, dressing, feeding, grooming, toileting, cognition, and psychosocial support Can the patient actively participate in an intensive therapy program of at least 3 hrs of therapy 5 days a week? Yes The potential for patient to make measurable gains while on inpatient rehab is excellent Anticipated functional outcomes upon discharge from inpatient rehab: supervision PT, supervision OT, supervision SLP Estimated rehab length of stay to reach the above functional goals is: 2-3 weeks Anticipated discharge destination: Home 10. Overall Rehab/Functional Prognosis: excellent   MD Signature: Leeroy Cha, MD

## 2021-02-27 NOTE — Progress Notes (Signed)
Physical Therapy Treatment Patient Details Name: Tonya [name] MRN: [id number] DOB: 04/09/1946 Today's Date: 02/27/2021    History of Present Illness The pt is a 75 yo female presenting 6/18 with c/o R-sided headache. 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 PMH includes: lung cancer s/p R upper lobectomy, currently undergoing chemo, GERD, and HLD.    PT Comments    Focused session on improving midline orientation in various positions. Pt transferred into stedy with modA in order to roll her in front of mirror in bathroom to encourage visual assistance to identify and maintain midline. However, poor success as pt continued to not attend to her L, keeping her neck and eyes rotated to the R. Pt believed she could see herself in the far R lateral inferior aspect of the mirror and that she was at midline despite max multi-modal cues to compare her position to other objects. Pt with strong L lateral lean/push needing up to maxA to maintain standing and sitting balance at times. Will continue to follow acutely. Current recommendations remain appropriate.    Follow Up Recommendations  CIR     Equipment Recommendations  Other (comment) (defer to post acute)    Recommendations for Other Services       Precautions / Restrictions Precautions Precautions: Fall Precaution Comments: L inattention, pushing to L Restrictions Weight Bearing Restrictions: No    Mobility  Bed Mobility Overal bed mobility: Needs Assistance Bed Mobility: Supine to Sit;Sit to Supine     Supine to sit: Min assist;HOB elevated Sit to supine: Min assist   General bed mobility comments: MinA to manage legs and trunk to sit EOB. Cues to square hips with EOB. MinA to manage legs and trunk back to supine.    Transfers Overall transfer level: Needs assistance Equipment used: Ambulation equipment used Transfers: Sit to/from Stand Sit to Stand: Mod assist         General transfer comment:  ModA to stand 1x EOB > stedy and 3x from flaps of stedy. Cues with hand-over-hand on L hand to keep placement on stedy bar. Tactile and verbal cues at L knee to extend.  Ambulation/Gait             General Gait Details: Focused session on midline orientation in standing this date.   Stairs             Wheelchair Mobility    Modified Rankin (Stroke Patients Only) Modified Rankin (Stroke Patients Only) Pre-Morbid Rankin Score: No symptoms Modified Rankin: Severe disability     Balance Overall balance assessment: Needs assistance Sitting-balance support: Feet supported;Bilateral upper extremity supported;Single extremity supported Sitting balance-Leahy Scale: Poor Sitting balance - Comments: Pt with posterior bouts of LOB, needing repeated cues to correct and use UE support to maintain. Min guard-modA at EOB and min guard-maxA on stedy, depending on extent of L pushing or posterior lean. Postural control: Left lateral lean;Posterior lean Standing balance support: Bilateral upper extremity supported;Single extremity supported Standing balance-Leahy Scale: Poor Standing balance comment: UE support on stedy bar, needing min-maxA to maintain standing balance x2 bouts in front of mirror in bathroom, x ~3-5 min each bout. Pt with L lateral lean/push, needing max cues to turn head to look in mirror or to look to L and R to compare body position and find midline. Poor success though as she still thought she was at midline pushing to the L.  Cognition Arousal/Alertness: Awake/alert Behavior During Therapy: WFL for tasks assessed/performed Overall Cognitive Status: Impaired/Different from baseline Area of Impairment: Attention;Following commands;Awareness;Problem solving;Safety/judgement;Memory                   Current Attention Level: Focused Memory: Decreased short-term memory Following Commands: Follows one step commands with  increased time;Follows one step commands inconsistently Safety/Judgement: Decreased awareness of safety;Decreased awareness of deficits Awareness: Intellectual Problem Solving: Slow processing;Decreased initiation;Requires verbal cues;Difficulty sequencing;Requires tactile cues General Comments: Pt with increased L inattention with tendency to keep neck rotated to R and eyes to the R during scanning exercises. Decreased awareness into her deficits and safety, needing repeated multi-modal simple cues and increased time for processing throughout. Poor spatial awareness, despite max visual cues.      Exercises Other Exercises Other Exercises: Standing x2 bouts of ~3-5 min duration with sitting bout in between in front of mirror at sink, cuing pt to scan the mirror to identify herself and her location to find midline and to scan the mirror for numbers written on it. Pt tends to keep neck and eyes rotated to the R, needing max cues to direct them to midline and slightly to L.    General Comments        Pertinent Vitals/Pain Pain Assessment: Faces Faces Pain Scale: No hurt Pain Intervention(s): Monitored during session    Home Living                      Prior Function            PT Goals (current goals can now be found in the care plan section) Acute Rehab PT Goals Patient Stated Goal: to improve PT Goal Formulation: With patient Time For Goal Achievement: 03/10/21 Potential to Achieve Goals: Good Progress towards PT goals: Progressing toward goals    Frequency    Min 4X/week      PT Plan Current plan remains appropriate    Co-evaluation              AM-PAC PT "6 Clicks" Mobility   Outcome Measure  Help needed turning from your back to your side while in a flat bed without using bedrails?: A Little Help needed moving from lying on your back to sitting on the side of a flat bed without using bedrails?: A Lot Help needed moving to and from a bed to a chair  (including a wheelchair)?: Total Help needed standing up from a chair using your arms (e.g., wheelchair or bedside chair)?: A Lot Help needed to walk in hospital room?: Total Help needed climbing 3-5 steps with a railing? : Total 6 Click Score: 10    End of Session Equipment Utilized During Treatment: Gait belt Activity Tolerance: Patient tolerated treatment well Patient left: in bed;with call bell/phone within reach;with bed alarm set   PT Visit Diagnosis: Other abnormalities of gait and mobility (R26.89);Hemiplegia and hemiparesis;Unsteadiness on feet (R26.81);Muscle weakness (generalized) (M62.81);Difficulty in walking, not elsewhere classified (R26.2);Other symptoms and signs involving the nervous system (R29.898) Hemiplegia - Right/Left: Left Hemiplegia - dominant/non-dominant: Non-dominant Hemiplegia - caused by: Nontraumatic intracerebral hemorrhage;Nontraumatic [facility]     Time: 5784-6962 PT Time Calculation (min) (ACUTE ONLY): 31 min  Charges:  $Therapeutic Activity: 8-22 mins $Neuromuscular Re-education: 8-22 mins                     Moishe Spice, PT, DPT Acute Rehabilitation Services  Pager: [phone] Office: [phone]    Maretta Bees  Pettis 02/27/2021, 6:58 PM

## 2021-02-27 NOTE — Progress Notes (Addendum)
PROGRESS NOTE    DECHELLE ATTAWAY  ZOX:[id number] DOB: 08-Mar-1946 DOA: 02/22/2021 PCP: Lin Landsman, MD   Brief Narrative: Tonya [name] is a 75 y.o. female with history of stage II lung cancer status post right upper lobectomy currently on chemotherapy, hyperlipidemia, anxiety, GERD.  Patient presented secondary to altered mental status and found to have an acute hemorrhagic stroke.  She was transferred to [facility] for higher level of care and neurology management.  Patient required intubation for acute respiratory failure and managed in ICU.   Assessment & Plan:   Active Problems:   GERD (gastroesophageal reflux disease)   Hyperlipidemia with target low density lipoprotein (LDL) cholesterol less than 100 mg/dL   Intracranial hemorrhage (HCC)   Stroke, 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HCC)   Right parietal intraparenchymal hemorrhage Associated small frontal subarachnoid hemorrhage. Unclear etiology. Patient underwent MR venogram which was negative for cerebral venous sinus thrombosis. MRA head/neck/MRI brain negative for vascular malformations/mets. Transthoracic Echocardiogram without evidence of embolic source. Recommendation for outpatient neurology follow-up. -Neurology recommendations: repeat MRI brain in 2-3 months, SBP goal <140 mmHg  Dysphagia Left-sided neglect Speech therapy consulted and MBS performed. Now with recommendation for regular solids.  Hypertension Goal SBP <140 per neurology. Patient was not on antihypertensive medication prior to admission. Started on Clevidipine initially and now transitioned to lisinopril. -Continue Lisinopril  Acute respiratory failure with hypoxia Patient required intubation on 6/18. She was successfully extubated on 6/20. Resolved.  Irregular heart rate Concern for possible paroxysmal atrial fibrillation  Stage IIb NSCLC Patient follows with Dr. Julien Nordmann as an outpatient. She is no longer on chemotherapy per discussion with  oncologist.  Hypokalemia Repleted. Resolved.  History of bradycardia History of ventricular bigeminy Noted.  Hallucinations Unclear etiology. Does not seem related to medications since this started prior to admission. Discussed with neurology who recommended discontinuing Keppra. On chart review, it appears patient is taking Xanax TID but when discussed with her at bedside, she stated she was not taking it at this time; possible she could be having withdrawal symptoms -Restart home Xanax -Watch symptoms  Nausea Chronic issue. Phenergan very helpful in managing symptoms -Continue phenergan prn  DVT prophylaxis: SCDs Code Status:   Code Status: Full Code Family Communication: None at bedside Disposition Plan: Discharge to CIR when bed is available. Medically stable for discharge.   Consultants:  Neurology Neurosurgery PCCM  Procedures:  TRANSTHORACIC ECHOCARDIOGRAM (02/24/2021) IMPRESSIONS     1. Left ventricular ejection fraction, by estimation, is 60 to 65%. The  left ventricle has normal function. The left ventricle has no regional  wall motion abnormalities. There is moderate left ventricular hypertrophy.  Left ventricular diastolic  parameters are consistent with Grade I diastolic dysfunction (impaired  relaxation).   2. Right ventricular systolic function is normal. The right ventricular  size is normal.   3. Left atrial size was mildly dilated.   4. The mitral valve is normal in structure. No evidence of mitral valve  regurgitation.   5. The aortic valve is tricuspid. Aortic valve regurgitation is not  visualized.   Antimicrobials: None    Subjective: Headache resolved but she has had some hallucinations. She describes seeing her grandchildren and daughter in the room staring at her but not responding to her.  Objective: Vitals:   02/27/21 0005 02/27/21 0010 02/27/21 0255 02/27/21 0821  BP: (!) 168/81  (!) 148/74 (!) 177/83  Pulse: (!) 101 81 79 76   Resp: _0 Temp: 99.5 F (37.5 C)  99.6 F (37.6 C) 98.7 F (37.1 C)  TempSrc: Oral  Oral Oral  SpO2: 97% 93% 98% 99%  Weight:      Height:        Intake/Output Summary (Last 24 hours) at 02/27/2021 1031 Last data filed at 02/27/2021 0400 Gross per 24 hour  Intake 219.25 ml  Output 400 ml  Net -180.75 ml    Filed Weights   02/22/21 2026 02/23/21 0125  Weight: 62 kg 62.4 kg    Examination:  General exam: Appears calm and comfortable Respiratory system: Clear to auscultation. Respiratory effort normal. Cardiovascular system: S1 & S2 heard, RRR. No murmurs, rubs, gallops or clicks. Gastrointestinal system: Abdomen is nondistended, soft and nontender. No organomegaly or masses felt. Normal bowel sounds heard. Central nervous system: Alert and oriented. Musculoskeletal: No edema. No calf tenderness Skin: No cyanosis. No rashes Psychiatry: Judgement and insight appear normal. Mood & affect appropriate.     Data Reviewed: I have personally reviewed following labs and imaging studies  CBC Lab Results  Component Value Date   WBC 9.1 02/27/2021   RBC 2.82 (L) 02/27/2021   HGB 9.3 (L) 02/27/2021   HCT 27.9 (L) 02/27/2021   MCV 98.9 02/27/2021   MCH 33.0 02/27/2021   PLT 296 02/27/2021   MCHC 33.3 02/27/2021   RDW 13.3 02/27/2021   LYMPHSABS 1.5 02/27/2021   MONOABS 0.9 02/27/2021   EOSABS 0.1 02/27/2021   BASOSABS 0.0 10/93/2355     Last metabolic panel Lab Results  Component Value Date   NA 136 02/27/2021   K 3.3 (L) 02/27/2021   CL 105 02/27/2021   CO2 17 (L) 02/27/2021   BUN 18 02/27/2021   CREATININE 0.96 02/27/2021   GLUCOSE 98 02/27/2021   GFRNONAA >60 02/27/2021   GFRAA >60 04/01/2020   CALCIUM 9.3 02/27/2021   PHOS 2.8 02/23/2021   PROT 6.9 02/27/2021   ALBUMIN 3.4 (L) 02/27/2021   BILITOT 1.5 (H) 02/27/2021   ALKPHOS 69 02/27/2021   AST 21 02/27/2021   ALT 14 02/27/2021   ANIONGAP 14 02/27/2021    CBG (last 3)  Recent Labs     02/26/21 1926 02/26/21 2317 02/27/21 0413  GLUCAP 81 80 90      GFR: Estimated Creatinine Clearance: 45.7 mL/min (by C-G formula based on SCr of 0.96 mg/dL).  Coagulation Profile: Recent Labs  Lab 02/22/21 2100  INR 1.1     Recent Results (from the past 240 hour(s))  Resp Panel by RT-PCR (Flu A&B, Covid) Nasopharyngeal Swab     Status: None   Collection Time: 02/22/21  8:46 PM   Specimen: Nasopharyngeal Swab; Nasopharyngeal(NP) swabs in vial transport medium  Result Value Ref Range Status   SARS Coronavirus 2 by RT PCR NEGATIVE NEGATIVE Final    Comment: (NOTE) SARS-CoV-2 target nucleic acids are NOT DETECTED.  The SARS-CoV-2 RNA is generally detectable in upper respiratory specimens during the acute phase of infection. The lowest concentration of SARS-CoV-2 viral copies this assay can detect is 138 copies/mL. A negative result does not preclude SARS-Cov-2 infection and should not be used as the sole basis for treatment or other patient management decisions. A negative result may occur with  improper specimen collection/handling, submission of specimen other than nasopharyngeal swab, presence of viral mutation(s) within the areas targeted by this assay, and inadequate number of viral copies(<138 copies/mL). A negative result must be combined with clinical observations, patient history, and epidemiological information. The expected result is Negative.  Fact Sheet for  Patients:  EntrepreneurPulse.com.au  Fact Sheet for Healthcare Providers:  IncredibleEmployment.be  This test is no t yet approved or cleared by the [place] FDA and  has been authorized for detection and/or diagnosis of SARS-CoV-2 by FDA under an Emergency Use Authorization (EUA). This EUA will remain  in effect (meaning this test can be used) for the duration of the COVID-19 declaration under Section 564(b)(1) of the Act, 21 U.S.C.section 360bbb-3(b)(1), unless  the authorization is terminated  or revoked sooner.       Influenza A by PCR NEGATIVE NEGATIVE Final   Influenza B by PCR NEGATIVE NEGATIVE Final    Comment: (NOTE) The Xpert Xpress SARS-CoV-2/FLU/RSV plus assay is intended as an aid in the diagnosis of influenza from Nasopharyngeal swab specimens and should not be used as a sole basis for treatment. Nasal washings and aspirates are unacceptable for Xpert Xpress SARS-CoV-2/FLU/RSV testing.  Fact Sheet for Patients: EntrepreneurPulse.com.au  Fact Sheet for Healthcare Providers: IncredibleEmployment.be  This test is not yet approved or cleared by the [place] FDA and has been authorized for detection and/or diagnosis of SARS-CoV-2 by FDA under an Emergency Use Authorization (EUA). This EUA will remain in effect (meaning this test can be used) for the duration of the COVID-19 declaration under Section 564(b)(1) of the Act, 21 U.S.C. section 360bbb-3(b)(1), unless the authorization is terminated or revoked.  Performed at [facility], [place] [address] [place], [place] [address]   MRSA Next Gen by PCR, Nasal     Status: None   Collection Time: 02/23/21  1:35 AM   Specimen: Nasal Mucosa; Nasal Swab  Result Value Ref Range Status   MRSA by PCR Next Gen NOT DETECTED NOT DETECTED Final    Comment: (NOTE) The GeneXpert MRSA Assay (FDA approved for NASAL specimens only), is one component of a comprehensive MRSA colonization surveillance program. It is not intended to diagnose MRSA infection nor to guide or monitor treatment for MRSA infections. Test performance is not FDA approved in patients less than 64 years old. Performed at [facility], [place] [address] [place], [place] [address]          Radiology Studies: DG Swallowing Func-Speech Pathology  Result Date: 02/25/2021 Formatting of this result is different from the original. Objective Swallowing Evaluation:  Type of Study: MBS-Modified Barium Swallow Study  Patient Details Name: MARYKATHRYN CARBONI MRN: [id number] Date of Birth: 08/27/1946 Today's Date: 02/25/2021 Time: SLP Start Time (ACUTE ONLY): 1028 -SLP Stop Time (ACUTE ONLY): 8295 SLP Time Calculation (min) (ACUTE ONLY): 17 min Past Medical History: Past Medical History: Diagnosis Date  Adenoma of left adrenal gland   Cancer ([place])   Lung  Colon polyps   hyperplastic  Complication of anesthesia   Very emotional and cries after anesthesia  GERD (gastroesophageal reflux disease)   Kidney stone   Liver lesion   Microhematuria  Past Surgical History: Past Surgical History: Procedure Laterality Date  ABDOMINAL HYSTERECTOMY    APPENDECTOMY    BRONCHIAL BIOPSY  07/18/2020  Procedure: BRONCHIAL BIOPSIES;  Surgeon: [name], DO;  Location: [facility];  Service: Pulmonary;;  BRONCHIAL BRUSHINGS  07/18/2020  Procedure: BRONCHIAL BRUSHINGS;  Surgeon: [name], DO;  Location: [facility];  Service: Pulmonary;;  BRONCHIAL NEEDLE ASPIRATION BIOPSY  07/18/2020  Procedure: BRONCHIAL NEEDLE ASPIRATION BIOPSIES;  Surgeon: [name], DO;  Location: [facility];  Service: Pulmonary;;  BRONCHIAL WASHINGS  07/18/2020  Procedure: BRONCHIAL WASHINGS;  Surgeon: [name], DO;  Location:  [facility];  Service: Pulmonary;;  FIDUCIAL MARKER PLACEMENT  07/18/2020  Procedure: FIDUCIAL MARKER PLACEMENT;  Surgeon: [name], DO;  Location: [facility];  Service: Pulmonary;;  INTERCOSTAL NERVE BLOCK Right 08/26/2020  Procedure: INTERCOSTAL NERVE BLOCK;  Surgeon: [name], MD;  Location: [facility];  Service: Thoracic;  Laterality: Right;  IR THORACENTESIS ASP PLEURAL SPACE W/IMG GUIDE  10/10/2020  NODE DISSECTION Right 08/26/2020  Procedure: NODE DISSECTION;  Surgeon: [name], MD;  Location: [facility];  Service: Thoracic;  Laterality: Right;  THORACENTESIS Right 11/04/2020  Procedure: Mathews Robinsons;  Surgeon: [name], DO;  Location:  [place] [facility];  Service: Pulmonary;  Laterality: Right;  THORACENTESIS Right 11/29/2020  Procedure: THORACENTESIS;  Surgeon: [name], DO;  Location[facility];  Service: Pulmonary;  Laterality: Right;  VIDEO BRONCHOSCOPY WITH ENDOBRONCHIAL NAVIGATION N/A 07/18/2020  Procedure: VIDEO BRONCHOSCOPY WITH ENDOBRONCHIAL NAVIGATION;  Surgeon: [name], DO;  Location: [facility];  Service: Pulmonary;  Laterality: N/A;  VIDEO BRONCHOSCOPY WITH ENDOBRONCHIAL ULTRASOUND  07/18/2020  Procedure: VIDEO BRONCHOSCOPY WITH ENDOBRONCHIAL ULTRASOUND;  Surgeon: [name], DO;  Location: [facility];  Service: Pulmonary;; HPI: 75 yr old admitted with headache, confusion. Found to have CT head shows a large intraparenchymal hemorrhage within the R parietal lobe as well as some R frontal subarachnoid hemorrhage. PMH: lung cancer s/p R upper lobectomy, currently undergoing chemotherapy , GERD, kidney stone, adenoma of left adrenal gland  Subjective: alert, pleasant upright in chair for procedure Assessment / Plan / Recommendation CHL IP CLINICAL IMPRESSIONS 02/25/2021 Clinical Impression Pt presents with a mild oral and likely component of esophageal dysphagia.  Oral deficits c/b reduced bolus cohesion, mild oral bolus delay, and mild oral residuals across POs. Second volitional swallows assisted in clearing oral cavity. Pharyngeal phase of the swallow was within functional limits. Airway protection was completly intact without laryngeal penetration or tracheal aspiration. Swallowing efficiency was also intact. Pt noted to have brief barium tablet stasis in mid esophagus with barium retention requiring additional liquid wash to pass to stomach. Esophageal barium reflux also exhibited. MBSS is not a test of the esophagus. If any esophageal clincial symptoms are exhibited, consider further esophageal workup. Recommend regular thin liquid diet. SLP to follow up. SLP Visit Diagnosis Dysphagia, oral phase  (R13.11);Dysphagia, unspecified (R13.10) Attention and concentration deficit following -- Frontal lobe and executive function deficit following -- Impact on safety and function Mild aspiration risk   CHL IP TREATMENT RECOMMENDATION 02/25/2021 Treatment Recommendations Therapy as outlined in treatment plan below   Prognosis 02/25/2021 Prognosis for Safe Diet Advancement Good Barriers to Reach Goals Cognitive deficits Barriers/Prognosis Comment -- CHL IP DIET RECOMMENDATION 02/25/2021 SLP Diet Recommendations Regular solids;Thin liquid Liquid Administration via Cup;Straw Medication Administration Whole meds with liquid Compensations Minimize environmental distractions;Small sips/bites;Slow rate Postural Changes Remain semi-upright after after feeds/meals (Comment);Seated upright at 90 degrees   CHL IP OTHER RECOMMENDATIONS 02/25/2021 Recommended Consults -- Oral Care Recommendations Oral care BID Other Recommendations --   CHL IP FOLLOW UP RECOMMENDATIONS 02/25/2021 Follow up Recommendations Inpatient Rehab   CHL IP FREQUENCY AND DURATION 02/25/2021 Speech Therapy Frequency (ACUTE ONLY) min 2x/week Treatment Duration 2 weeks      CHL IP ORAL PHASE 02/25/2021 Oral Phase Impaired Oral - Pudding Teaspoon -- Oral - Pudding Cup -- Oral - Honey Teaspoon -- Oral - Honey Cup -- Oral - Nectar Teaspoon -- Oral - Nectar Cup Delayed oral transit;Lingual/palatal residue Oral - Nectar Straw -- Oral - Thin Teaspoon -- Oral -  Thin Cup Delayed oral transit;Lingual/palatal residue Oral - Thin Straw Lingual/palatal residue;Delayed oral transit Oral - Puree Lingual/palatal residue;Decreased bolus cohesion Oral - Mech Soft Lingual/palatal residue;Decreased bolus cohesion Oral - Regular -- Oral - Multi-Consistency -- Oral - Pill Lingual/palatal residue;Delayed oral transit;Reduced posterior propulsion;Lingual pumping Oral Phase - Comment --  CHL IP PHARYNGEAL PHASE 02/25/2021 Pharyngeal Phase Impaired Pharyngeal- Pudding Teaspoon -- Pharyngeal --  Pharyngeal- Pudding Cup -- Pharyngeal -- Pharyngeal- Honey Teaspoon -- Pharyngeal -- Pharyngeal- Honey Cup -- Pharyngeal -- Pharyngeal- Nectar Teaspoon -- Pharyngeal -- Pharyngeal- Nectar Cup Delayed swallow initiation-pyriform sinuses Pharyngeal -- Pharyngeal- Nectar Straw -- Pharyngeal -- Pharyngeal- Thin Teaspoon -- Pharyngeal -- Pharyngeal- Thin Cup Delayed swallow initiation-pyriform sinuses Pharyngeal -- Pharyngeal- Thin Straw Delayed swallow initiation-pyriform sinuses Pharyngeal -- Pharyngeal- Puree Delayed swallow initiation-vallecula Pharyngeal -- Pharyngeal- Mechanical Soft Delayed swallow initiation-vallecula Pharyngeal -- Pharyngeal- Regular -- Pharyngeal -- Pharyngeal- Multi-consistency -- Pharyngeal -- Pharyngeal- Pill Delayed swallow initiation-pyriform sinuses Pharyngeal -- Pharyngeal Comment --  CHL IP CERVICAL ESOPHAGEAL PHASE 02/25/2021 Cervical Esophageal Phase WFL Pudding Teaspoon -- Pudding Cup -- Honey Teaspoon -- Honey Cup -- Nectar Teaspoon -- Nectar Cup -- Nectar Straw -- Thin Teaspoon -- Thin Cup -- Thin Straw -- Puree -- Mechanical Soft -- Regular -- Multi-consistency -- Pill -- Cervical Esophageal Comment -- Chelsea E Hartness MA, CCC-SLP 02/25/2021, 11:10 AM                   Scheduled Meds:  chlorhexidine gluconate (MEDLINE KIT)  15 mL Mouth Rinse BID   Chlorhexidine Gluconate Cloth  6 each Topical Daily   folic acid  1 mg Oral Daily   lisinopril  10 mg Oral Daily   senna-docusate  1 tablet Oral BID   umeclidinium-vilanterol  1 puff Inhalation Daily   Continuous Infusions:  levETIRAcetam 500 mg (02/27/21 0524)   promethazine (PHENERGAN) injection (IM or IVPB) 12.5 mg (02/27/21 0402)     LOS: 5 days     Cordelia Poche, MD [facility] 02/27/2021, 10:31 AM  If 7PM-7AM, please contact night-coverage [email or web address]

## 2021-02-27 NOTE — H&P (Signed)
Physical Medicine and Rehabilitation Admission H&P    Chief Complaint  Patient presents with   Functional decline due to Velva.    HPI: Tonya [name] is a 75 year old female with history of Lung cancer s/p RUL lobectomy with ongoing chemo and bradycardia in the past who was evaluated at OSH on 02/22/21 after presenting with reports of HA followed by confusion. Per teleneurology report patient progressed to "stuporous state, was non verbal with right gaze preference and had left>right sided weakness" CT head showed large intraparenchymal hemorrhage within right parietal lobe as well as mild right frontal subarachnoid hemorrhage and patient had tremulousness with concerns of seizure activity.  UDS positive for benzos and THC.  She was intubated for airway protection and transferred to [facility] for management.  CT head showed IPH in high parietal lobe with subarachnoid extension over right convexity and basal cisterns.  Dr. Venetia Constable consulted for input and recommended follow-up MRI/MRA to rule out underlying mass or vascular abnormality.   MRI/MRA brain done showing stable appearance of right parietal hemorrhage without underlying mass or vascular malformation, subdural and subarachnoid hemorrhage and small amount of blood within the ventricles without evidence of stenosis, aneurysm or occlusion.  MRV of the head was negative.  She was noted to have right upper extremity tremors on 06/19 and was started on Keppra.  EEG done showing cortical dysfunction right hemisphere secondary to underlying stroke and severe diffuse encephalopathy felt to be nonspecific but likely related to sedation.  She tolerated extubation without difficulty on 06/20 and MBS done showing mild esophageal dysphagia with intact airway protection therefore started on regular diet. She has had issues with tachycardia with heart rates up to 140s and beta-blocker added for rate control.  2D echo showed EF 60 to 65%  with moderate LVH and grade 1 DD.  Blood pressures noted to be elevated and medications added for better control. Dr. Leonie Man felt that bleed was likely hypertensive in nature but to repeat MRI brain in 2 to 3 months due to unclear etiology of bleed. Her lethargy has resolved and patient with resultant left-sided weakness with right gaze preference, balance deficits with left inattention and strong left lean as well as high-level cognitive deficits.  Therapy ongoing and CIR was recommended due to functional decline. Current alert and oriented x3 but confused as per daughter and husband.    Review of Systems  Constitutional:  Negative for chills and fever.  HENT:  Negative for hearing loss.   Eyes:  Negative for discharge.  Respiratory:  Negative for shortness of breath.   Cardiovascular:  Negative for chest pain, palpitations and leg swelling.  Gastrointestinal:  Negative for abdominal pain and nausea.  Genitourinary:  Negative for dysuria.  Musculoskeletal:  Positive for back pain.  Skin:  Negative for rash.  Neurological:  Positive for weakness. Negative for dizziness and headaches.  Psychiatric/Behavioral:  Positive for hallucinations and memory loss.     Past Medical History:  Diagnosis Date   Adenoma of left adrenal gland    Cancer (HCC)    Lung   Colon polyps    hyperplastic   Complication of anesthesia    Very emotional and cries after anesthesia   GERD (gastroesophageal reflux disease)    Kidney stone    Liver lesion    Microhematuria     Past Surgical History:  Procedure Laterality Date   ABDOMINAL HYSTERECTOMY     APPENDECTOMY     BRONCHIAL BIOPSY  07/18/2020   Procedure: BRONCHIAL BIOPSIES;  Surgeon: [name], DO;  Location: [place] [facility];  Service: Pulmonary;;   BRONCHIAL BRUSHINGS  07/18/2020   Procedure: BRONCHIAL BRUSHINGS;  Surgeon: [name], DO;  Location: [place];  Service: Pulmonary;;   BRONCHIAL NEEDLE ASPIRATION BIOPSY  07/18/2020    Procedure: BRONCHIAL NEEDLE ASPIRATION BIOPSIES;  Surgeon: [name], DO;  Location: [place] [facility];  Service: Pulmonary;;   BRONCHIAL WASHINGS  07/18/2020   Procedure: BRONCHIAL WASHINGS;  Surgeon: [name], DO;  Location: [place] [facility];  Service: Pulmonary;;   FIDUCIAL MARKER PLACEMENT  07/18/2020   Procedure: FIDUCIAL MARKER PLACEMENT;  Surgeon: [name], DO;  Location: [place] [facility];  Service: Pulmonary;;   INTERCOSTAL NERVE BLOCK Right 08/26/2020   Procedure: INTERCOSTAL NERVE BLOCK;  Surgeon: [name], MD;  Location: [place];  Service: Thoracic;  Laterality: Right;   IR THORACENTESIS ASP PLEURAL SPACE W/IMG GUIDE  10/10/2020   NODE DISSECTION Right 08/26/2020   Procedure: NODE DISSECTION;  Surgeon: [name], MD;  Location: [place];  Service: Thoracic;  Laterality: Right;   THORACENTESIS Right 11/04/2020   Procedure: Mathews Robinsons;  Surgeon: [name], DO;  Location: [place];  Service: Pulmonary;  Laterality: Right;   THORACENTESIS Right 11/29/2020   Procedure: THORACENTESIS;  Surgeon: [name], DO;  Location: [facility];  Service: Pulmonary;  Laterality: Right;   VIDEO BRONCHOSCOPY WITH ENDOBRONCHIAL NAVIGATION N/A 07/18/2020   Procedure: VIDEO BRONCHOSCOPY WITH ENDOBRONCHIAL NAVIGATION;  Surgeon: [name], DO;  Location: [place];  Service: Pulmonary;  Laterality: N/A;   VIDEO BRONCHOSCOPY WITH ENDOBRONCHIAL ULTRASOUND  07/18/2020   Procedure: VIDEO BRONCHOSCOPY WITH ENDOBRONCHIAL ULTRASOUND;  Surgeon: [name], DO;  Location: [facility];  Service: Pulmonary;;    Family History  Problem Relation Age of Onset   Arrhythmia Mother        s/p Pacer    Heart attack Father    Heart disease Sister    Colon cancer Neg Hx     Social History: Married. Independent PTA. Used to work for [employer]. She  reports that she quit smoking about 8 months ago. Her smoking use included cigarettes. She has a 18.00 pack-year smoking  history. She has never used smokeless tobacco. She reports current alcohol use--on rare occasions . She reports that she does not use drugs.   Allergies  Allergen Reactions   Other Anaphylaxis    Spiders   Penicillins Hives    REACTION: 20 years    Medications Prior to Admission  Medication Sig Dispense Refill   acetaminophen (TYLENOL) 500 MG tablet Take 2 tablets (1,000 mg total) by mouth every 6 (six) hours as needed. (Patient taking differently: Take 1,000 mg by mouth every 6 (six) hours as needed for mild pain or moderate pain.) 30 tablet 0   albuterol (VENTOLIN HFA) 108 (90 Base) MCG/ACT inhaler Inhale 2 puffs into the lungs every 6 (six) hours as needed for wheezing or shortness of breath. 8 g 2   ALPRAZolam (XANAX) 1 MG tablet Take 1 mg by mouth in the morning, at noon, and at bedtime.     aspirin EC 81 MG tablet Take 81 mg by mouth daily. Swallow whole.     cyclobenzaprine (FLEXERIL) 5 MG tablet Take 5 mg by mouth 3 (three) times daily as needed for muscle spasms (sciatic nerve).      folic acid (FOLVITE) 1 MG tablet Take 1 tablet (1 mg total) by mouth daily. (Patient taking  differently: Take 1 mg by mouth daily at 12 noon.) 30 tablet 4   magnesium oxide (MAG-OX) 400 (241.3 Mg) MG tablet Take 1 tablet (400 mg total) by mouth 2 (two) times daily. 60 tablet 1   naproxen (NAPROSYN) 375 MG tablet Take 1 tablet (375 mg total) by mouth 2 (two) times daily as needed for moderate pain. 8 tablet 0   oxyCODONE-acetaminophen (PERCOCET) 10-325 MG tablet Take 1 tablet by mouth every 4 (four) hours as needed for pain (sciatic nerve pain).     pregabalin (LYRICA) 25 MG capsule Take 1 capsule (25 mg total) by mouth 2 (two) times daily. 60 capsule 1   prochlorperazine (COMPAZINE) 10 MG tablet TAKE 1 TABLET(10 MG) BY MOUTH EVERY 6 HOURS AS NEEDED FOR NAUSEA OR VOMITING (Patient taking differently: Take 10 mg by mouth every 6 (six) hours as needed for nausea or vomiting.) 30 tablet 2   rosuvastatin  (CRESTOR) 10 MG tablet Take 10 mg by mouth at bedtime.     alendronate (FOSAMAX) 70 MG tablet Take 70 mg by mouth every Saturday.     Calcium Carbonate-Vitamin D (CALCIUM + D PO) Take 1 tablet by mouth 2 (two) times daily.     fluconazole (DIFLUCAN) 100 MG tablet Take 1 tablet (100 mg total) by mouth daily. 7 tablet 0   guaiFENesin (MUCINEX) 600 MG 12 hr tablet Take 2 tablets (1,200 mg total) by mouth 2 (two) times daily as needed. (Patient taking differently: Take 1,200 mg by mouth 2 (two) times daily as needed for to loosen phlegm.)     ibuprofen (ADVIL) 800 MG tablet Take 1 tablet (800 mg total) by mouth every 8 (eight) hours as needed for moderate pain. 30 tablet 0   LORazepam (ATIVAN) 0.5 MG tablet Take 1 tablet (0.5 mg total) by mouth daily as needed for anxiety. And nausea. 15 tablet 0   omeprazole (PRILOSEC) 40 MG capsule Take 40 mg by mouth 2 (two) times daily.     ondansetron (ZOFRAN) 8 MG tablet Take 1 tablet (8 mg total) by mouth every 8 (eight) hours as needed for nausea or vomiting. Starting at least 3 days after chemotherapy (Patient not taking: No sig reported) 30 tablet 1    Drug Regimen Review  Drug regimen was reviewed and remains appropriate with no significant issues identified  Home: Home Living Family/patient expects to be discharged to:: Private residence Living Arrangements: Spouse/significant other Available Help at Discharge: Family, Available 24 hours/day Type of Home: House Home Access: Level entry Home Layout: One level Bathroom Shower/Tub: Multimedia programmer: Standard Home Equipment: Grab bars - tub/shower, Shower seat, Hand held shower head   Functional History: Prior Function Level of Independence: Independent Comments: driving, retired  Functional Status:  Mobility: Bed Mobility Overal bed mobility: Needs Assistance Bed Mobility: Supine to Sit Supine to sit: Min assist, HOB elevated Sit to supine: Mod assist General bed mobility  comments: MinA to manage L leg and trunk to sit EOB. Pt needing cues to flex L knee as she tends to keep it extended. Cues to square hips with EOB. Transfers Overall transfer level: Needs assistance Equipment used: 2 person hand held assist, Rolling walker (2 wheeled) Transfers: Sit to/from Stand, Stand Pivot Transfers Sit to Stand: Mod assist, +2 physical assistance, +2 safety/equipment Stand pivot transfers: Max assist, +2 physical assistance, +2 safety/equipment General transfer comment: Sit to stand 1x from EOB, 1x from commode, and 1x from recliner. Faded feddback provided with each rep in regards  to cuing pt for proper feet placement. Pt needs cues to look at her bil feet and match her L to her R along with physical assistance. cues to push up from current sitting surface and to place L hand on RW. MaxAx2 to stand step to L bed > commode 1x and recliner > bed 1x, providing L knee block during stance and blocking strong L lean. Ambulation/Gait Ambulation/Gait assistance: Max assist, +2 physical assistance, +2 safety/equipment Gait Distance (Feet): 2 Feet Assistive device: 2 person hand held assist, Rolling walker (2 wheeled) Gait Pattern/deviations: Step-to pattern, Decreased stance time - right, Decreased stance time - left, Decreased step length - left, Decreased step length - right, Decreased stride length, Decreased dorsiflexion - left, Decreased weight shift to right, Trunk flexed, Narrow base of support General Gait Details: Stand step transfers towards L 2x with UE support and another gait bout with RW performed standing anterior to recliner, cuing pt to step anterior <> posterior 3x each foot. L knee block during L stance with cues at L posterior thigh to advance leg during step phase. Cues provided to use vision to find midline to decrease her L lateral lean. Improved R weight shift with subsequent reps. Gait velocity: reduced Gait velocity interpretation: <1.31 ft/sec, indicative of  household ambulator    ADL: ADL Overall ADL's : Needs assistance/impaired Eating/Feeding: NPO Grooming: Minimal assistance, Bed level Grooming Details (indicate cue type and reason): with RUE Upper Body Bathing: Moderate assistance Lower Body Bathing: Maximal assistance Upper Body Dressing : Moderate assistance Lower Body Dressing: Bed level, Maximal assistance Lower Body Dressing Details (indicate cue type and reason): figure 4 for RLE Toilet Transfer: Maximal assistance, +2 for physical assistance, +2 for safety/equipment, Squat-pivot, BSC, Total assistance Toilet Transfer Details (indicate cue type and reason): totalA +1; maxA+2 Toileting- Clothing Manipulation and Hygiene: Total assistance Toileting - Clothing Manipulation Details (indicate cue type and reason): BM, able to "stand" for 30 secs Functional mobility during ADLs: Maximal assistance, +2 for physical assistance, +2 for safety/equipment, Total assistance, Cueing for safety, Cueing for sequencing General ADL Comments: Pt limited by decreased strength, L inattention, decreased mobility and decreased ability to care for self. Pt reports that she wears glasses, but does not have them here with her.  Cognition: Cognition Overall Cognitive Status: Impaired/Different from baseline Arousal/Alertness: Awake/alert (drowsy at times) Orientation Level: Oriented X4 Attention: Sustained Sustained Attention: Impaired Sustained Attention Impairment: Verbal basic, Functional basic Memory: Impaired Memory Impairment: Storage deficit, Retrieval deficit Awareness: Impaired Awareness Impairment: Emergent impairment, Anticipatory impairment, Intellectual impairment Problem Solving: Impaired Problem Solving Impairment: Verbal basic Executive Function: Sequencing, Reasoning, Organizing Behaviors: Other (comment) (stated she was a little depressed) Safety/Judgment: Impaired Cognition Arousal/Alertness: Awake/alert Behavior During  Therapy: WFL for tasks assessed/performed Overall Cognitive Status: Impaired/Different from baseline Area of Impairment: Attention, Following commands, Awareness, Problem solving, Safety/judgement, Memory Current Attention Level: Focused Memory: Decreased short-term memory Following Commands: Follows one step commands with increased time, Follows one step commands consistently, Follows multi-step commands inconsistently Safety/Judgement: Decreased awareness of safety, Decreased awareness of deficits Awareness: Emergent Problem Solving: Slow processing, Decreased initiation, Requires verbal cues, Difficulty sequencing, Requires tactile cues General Comments: Pt slow to process commands, but follows single step commands consistently. Pt with L inattention, needing repeated multi-modal cues to attend to L side and find and maintain midline. Pt forgetful of cues to not scoot inferiorly in recliner, thus poor safety awareness and memory deficits. Poor sequencing. Of note, after leaving room several minutes pt had again scooted down  almost out of chair (3x total noted this date), thus pt returned to bed to maintain her safety.   Blood pressure (!) 177/83, pulse 76, temperature 98.7 F (37.1 C), temperature source Oral, resp. rate 12, height 5\' 4"  (1.626 m), weight 62.4 kg, SpO2 99 %. Physical Exam Vitals and nursing note reviewed.  Constitutional:      Appearance: She is well-developed.  Cardiovascular:     Rate and Rhythm: Tachycardia present.     Comments: Mild resting tachycardia with HR up to 110's with minimal activity.  Pulmonary: breathing comfortably Abdomen: NT, ND Skin: intact Neurological:     Mental Status: She is alert and oriented to person, place, and time.     Comments: Speech soft but clear. Slow to process--was able to state date as June 23 but thought it was Wednesday. Hallucinating --talking about slug she put in the toilet (pointing to bed rail). Banging on pillow asking for  meds from home. Lying sideways slumped to the left and refused to reposition due to "bugs" in her bed. Mild left inattention with left hemiplegia.  Strength 3/5 left side, 5/5 right.  Psychiatric:        Cognition and Memory: Cognition is impaired.   Results for orders placed or performed during the hospital encounter of 02/22/21 (from the past 48 hour(s))  Glucose, capillary     Status: None   Collection Time: 02/25/21  4:00 PM  Result Value Ref Range   Glucose-Capillary 90 70 - 99 mg/dL    Comment: Glucose reference range applies only to samples taken after fasting for at least 8 hours.  Glucose, capillary     Status: Abnormal   Collection Time: 02/25/21  7:17 PM  Result Value Ref Range   Glucose-Capillary 109 (H) 70 - 99 mg/dL    Comment: Glucose reference range applies only to samples taken after fasting for at least 8 hours.  Glucose, capillary     Status: Abnormal   Collection Time: 02/25/21 11:14 PM  Result Value Ref Range   Glucose-Capillary 101 (H) 70 - 99 mg/dL    Comment: Glucose reference range applies only to samples taken after fasting for at least 8 hours.  Glucose, capillary     Status: Abnormal   Collection Time: 02/26/21  3:13 AM  Result Value Ref Range   Glucose-Capillary 102 (H) 70 - 99 mg/dL    Comment: Glucose reference range applies only to samples taken after fasting for at least 8 hours.  Brain natriuretic peptide     Status: Abnormal   Collection Time: 02/26/21  4:03 AM  Result Value Ref Range   B Natriuretic Peptide 202.3 (H) 0.0 - 100.0 pg/mL    Comment: Performed at [facility] [address], [place] [address]  CBC with Differential/Platelet     Status: Abnormal   Collection Time: 02/26/21  4:03 AM  Result Value Ref Range   WBC 7.0 4.0 - 10.5 K/uL   RBC 2.90 (L) 3.87 - 5.11 MIL/uL   Hemoglobin 9.6 (L) 12.0 - 15.0 g/dL   HCT 28.6 (L) 36.0 - 46.0 %   MCV 98.6 80.0 - 100.0 fL   MCH 33.1 26.0 - 34.0 pg   MCHC 33.6 30.0 - 36.0 g/dL    RDW 14.1 11.5 - 15.5 %   Platelets 243 150 - 400 K/uL   nRBC 0.0 0.0 - 0.2 %   Neutrophils Relative % 67 %   Neutro Abs 4.8 1.7 - 7.7 K/uL  Lymphocytes Relative 20 %   Lymphs Abs 1.4 0.7 - 4.0 K/uL   Monocytes Relative 10 %   Monocytes Absolute 0.7 0.1 - 1.0 K/uL   Eosinophils Relative 1 %   Eosinophils Absolute 0.0 0.0 - 0.5 K/uL   Basophils Relative 1 %   Basophils Absolute 0.0 0.0 - 0.1 K/uL   Immature Granulocytes 1 %   Abs Immature Granulocytes 0.04 0.00 - 0.07 K/uL    Comment: Performed at [facility], [place] [address] [place], [place] [address]  Comprehensive metabolic panel     Status: Abnormal   Collection Time: 02/26/21  4:03 AM  Result Value Ref Range   Sodium 137 135 - 145 mmol/L   Potassium 3.6 3.5 - 5.1 mmol/L   Chloride 102 98 - 111 mmol/L   CO2 21 (L) 22 - 32 mmol/L   Glucose, Bld 94 70 - 99 mg/dL    Comment: Glucose reference range applies only to samples taken after fasting for at least 8 hours.   BUN 18 8 - 23 mg/dL   Creatinine, Ser 0.87 0.44 - 1.00 mg/dL   Calcium 9.3 8.9 - 10.3 mg/dL   Total Protein 6.4 (L) 6.5 - 8.1 g/dL   Albumin 3.3 (L) 3.5 - 5.0 g/dL   AST 19 15 - 41 U/L   ALT 12 0 - 44 U/L   Alkaline Phosphatase 70 38 - 126 U/L   Total Bilirubin 1.1 0.3 - 1.2 mg/dL   GFR, Estimated >60 >60 mL/min    Comment: (NOTE) Calculated using the CKD-EPI Creatinine Equation (2021)    Anion gap 14 5 - 15    Comment: Performed at [facility], [place] [address] [place],  [address]  Magnesium     Status: None   Collection Time: 02/26/21  4:03 AM  Result Value Ref Range   Magnesium 2.0 1.7 - 2.4 mg/dL    Comment: Performed at [facility] [address] [place], [place] [address]  Glucose, capillary     Status: Abnormal   Collection Time: 02/26/21  7:18 AM  Result Value Ref Range   Glucose-Capillary 104 (H) 70 - 99 mg/dL    Comment: Glucose reference range applies only to samples taken after fasting for at least 8 hours.   Glucose, capillary     Status: None   Collection Time: 02/26/21 11:03 AM  Result Value Ref Range   Glucose-Capillary 89 70 - 99 mg/dL    Comment: Glucose reference range applies only to samples taken after fasting for at least 8 hours.  Glucose, capillary     Status: None   Collection Time: 02/26/21  3:53 PM  Result Value Ref Range   Glucose-Capillary 86 70 - 99 mg/dL    Comment: Glucose reference range applies only to samples taken after fasting for at least 8 hours.  Glucose, capillary     Status: None   Collection Time: 02/26/21  7:26 PM  Result Value Ref Range   Glucose-Capillary 81 70 - 99 mg/dL    Comment: Glucose reference range applies only to samples taken after fasting for at least 8 hours.  Glucose, capillary     Status: None   Collection Time: 02/26/21 11:17 PM  Result Value Ref Range   Glucose-Capillary 80 70 - 99 mg/dL    Comment: Glucose reference range applies only to samples taken after fasting for at least 8 hours.  Brain natriuretic peptide     Status: Abnormal  Collection Time: 02/27/21  3:39 AM  Result Value Ref Range   B Natriuretic Peptide 327.0 (H) 0.0 - 100.0 pg/mL    Comment: Performed at [facility] [address] [place], [place] [address]  CBC with Differential/Platelet     Status: Abnormal   Collection Time: 02/27/21  3:39 AM  Result Value Ref Range   WBC 9.1 4.0 - 10.5 K/uL   RBC 2.82 (L) 3.87 - 5.11 MIL/uL   Hemoglobin 9.3 (L) 12.0 - 15.0 g/dL   HCT 27.9 (L) 36.0 - 46.0 %   MCV 98.9 80.0 - 100.0 fL   MCH 33.0 26.0 - 34.0 pg   MCHC 33.3 30.0 - 36.0 g/dL   RDW 13.3 11.5 - 15.5 %   Platelets 296 150 - 400 K/uL   nRBC 0.0 0.0 - 0.2 %   Neutrophils Relative % 73 %   Neutro Abs 6.7 1.7 - 7.7 K/uL   Lymphocytes Relative 16 %   Lymphs Abs 1.5 0.7 - 4.0 K/uL   Monocytes Relative 10 %   Monocytes Absolute 0.9 0.1 - 1.0 K/uL   Eosinophils Relative 1 %   Eosinophils Absolute 0.1 0.0 - 0.5 K/uL   Basophils Relative 0 %   Basophils Absolute  0.0 0.0 - 0.1 K/uL   Immature Granulocytes 0 %   Abs Immature Granulocytes 0.03 0.00 - 0.07 K/uL    Comment: Performed at [facility] [address], [place], [place] [address]  Comprehensive metabolic panel     Status: Abnormal   Collection Time: 02/27/21  3:39 AM  Result Value Ref Range   Sodium 136 135 - 145 mmol/L   Potassium 3.3 (L) 3.5 - 5.1 mmol/L   Chloride 105 98 - 111 mmol/L   CO2 17 (L) 22 - 32 mmol/L   Glucose, Bld 98 70 - 99 mg/dL    Comment: Glucose reference range applies only to samples taken after fasting for at least 8 hours.   BUN 18 8 - 23 mg/dL   Creatinine, Ser 0.96 0.44 - 1.00 mg/dL   Calcium 9.3 8.9 - 10.3 mg/dL   Total Protein 6.9 6.5 - 8.1 g/dL   Albumin 3.4 (L) 3.5 - 5.0 g/dL   AST 21 15 - 41 U/L   ALT 14 0 - 44 U/L   Alkaline Phosphatase 69 38 - 126 U/L   Total Bilirubin 1.5 (H) 0.3 - 1.2 mg/dL   GFR, Estimated >60 >60 mL/min    Comment: (NOTE) Calculated using the CKD-EPI Creatinine Equation (2021)    Anion gap 14 5 - 15    Comment: Performed at [facility] [address], [place], [place] [address]  Magnesium     Status: None   Collection Time: 02/27/21  3:39 AM  Result Value Ref Range   Magnesium 1.7 1.7 - 2.4 mg/dL    Comment: Performed at [facility] [address], [place], [place] [address]  Glucose, capillary     Status: None   Collection Time: 02/27/21  4:13 AM  Result Value Ref Range   Glucose-Capillary 90 70 - 99 mg/dL    Comment: Glucose reference range applies only to samples taken after fasting for at least 8 hours.   No results found.     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  Antithrombotics: -DVT/anticoagulation:  Mechanical: Sequential  compression devices, below knee Bilateral lower extremities  -antiplatelet therapy: N/A 3. Headaches/Pain Management: Oxycodone prn for HA  --monitor for  symptoms--may need Topamax.  4. Anxiety: LCSW to follow for evaluation and support. Recommended to continue to minimize use of Ativan due to negative effects of long term use on cognition.  -antipsychotic agents: N/A 5. Neuropsych: This patient is not capable of making decisions on her own behalf. 6. Skin/Wound Care: Routine pressure relief measures.  7. Fluids/Electrolytes/Nutrition: Monitor I/O. Check CMET in am.  8. HTN: Monitor BP tid--continue Lisinopril and metoprolol.  --Poorly controlled -->SBP goal<160 9. Seizure prophylaxis: D/c keppra per neurology as may be contributing to her impaired cognition. 10. Intermittent fevers: Being monitored and have resolved without treatment.  11. Anemia of chronic illness?: Continue to monitor H/H with serial checks--around 9 since 12/2020.  12. Hypomagnesemia: Resume home dose of supplement. 13. Hypokalemia: Will add daily supplement.  14. Delirium: as per neurology, confusion may be secondary to Williams which has been stopped. UA reviewed and negative.   I have personally performed a face to face diagnostic evaluation, including, but not limited to relevant history and physical exam findings, of this patient and developed relevant assessment and plan.  Additionally, I have reviewed and concur with the physician assistant's documentation above.  Leeroy Cha, MD  Bary Leriche, PA-C 02/27/2021

## 2021-02-27 NOTE — Progress Notes (Signed)
  Speech Language Pathology Treatment: Dysphagia;Cognitive-Linquistic  Patient Details Name: Tonya [name] MRN: [id number] DOB: 21-Apr-1946 Today's Date: 02/27/2021 Time: 6440-3474 SLP Time Calculation (min) (ACUTE ONLY): 17 min  Assessment / Plan / Recommendation Clinical Impression  Therapy focused on cognitive and dysphagia goals with husband and pt's sister present. Pt coughed once after swallow of cracker then mildly delayed with water following solid trial. Family and pt report she was not coughing with lunch.  Hallucinated once seeing water on the ceiling and talking about fishing. Verbal explanation and feedback re: her decreased awareness provided. She wondered why she could not take a shower by herself. Needed moderate-max verbal assist for mental calculation re: age of grandson. Her orientation is accurate x 4. Problem solving and reasoning will continue to be addressed. Verbal response better than functional performance. Going to 75 rehab today hopefully. Recommend continue tx on CIR.      HPI HPI: 75 yr old admitted with headache, confusion. Found to have CT head shows a large intraparenchymal hemorrhage within the R parietal lobe as well as some R frontal subarachnoid hemorrhage. PMH: lung cancer s/p R upper lobectomy, currently undergoing chemotherapy , GERD, kidney stone, adenoma of left adrenal gland      SLP Plan  Continue with current plan of care (met swallow goals, continue with cognition)       Recommendations  Diet recommendations: Regular;Thin liquid Liquids provided via: Cup;Straw Medication Administration: Whole meds with puree Supervision: Patient able to self feed Compensations: Minimize environmental distractions;Small sips/bites;Slow rate Postural Changes and/or Swallow Maneuvers: Seated upright 90 degrees                General recommendations: Rehab consult Oral Care Recommendations: Oral care BID Follow up Recommendations: Inpatient  Rehab SLP Visit Diagnosis: Dysphagia, unspecified (R13.10);Cognitive communication deficit (R41.841) Plan: Continue with current plan of care (met swallow goals, continue with cognition)       GO                Tonya [name] 02/27/2021, 2:04 PM Tonya [name] Tonya [name].Ed Risk analyst [phone] Office [phone]

## 2021-02-27 NOTE — Progress Notes (Signed)
Patient ID: Tonya [name], female   DOB: [birth year], 75 y.o.   MRN: [id number] Met with the patient to introduce self and the role of the nurse CM. Patient is cooperative however appears to "know" /recognize people and calls staff by names of family and friends however aware she is in a hospital after a fall for a brain bleed. Reviewed rehab program, MD/PA, therapy schedule and plan of care. Patient noted she "needed to go home to get clothes for tomorrow"; when reminded she was in the hospital, could her spouse bring in something she noted that he would not get the right items even if they were right in front of him. REviewed purewick was removed and how to notify nurse that she needed assist to toilet. Reviewed diet and medications post [facility] and patient noted she was familiar with this information. Continue to follow along to discharge to address educational needs. Handouts left at the bedside for family as a resource. Collaborate with the team and SW to facilitate preparation for discharge. Margarito Liner, RN

## 2021-02-27 NOTE — Discharge Summary (Signed)
Physician Discharge Summary  SILVA AAMODT MAU:[id number] DOB: 12-14-1945 DOA: 02/22/2021  PCP: Lin Landsman, MD  Admit date: 02/22/2021 Discharge date: 02/27/2021  Admitted From: Home Disposition: Inpatient rehabilitation  Recommendations for Outpatient Follow-up:  Follow up with PCP after discharge Follow-up with Dr. Leonie Man in 4 weeks Repeat MRI in 2-3 months Titrate antihypertensives for goal SBP >140 mmHg Please follow up on the following pending results: None  Discharge Condition: Stable CODE STATUS: Full code Diet recommendation: Heart healthy   Brief/Interim Summary:  Admission HPI written by Gwinda Maine, MD   HPI: Tonya [name] is a 75 y.o. female stage 2 lung cancer s/p R upper lobectomy, currently undergoing chemotherapy brought to OSH for altered mental status found to have hemorrhagic stroke and transferred to [facility] for higher level of care.   Her husband stated that she was doing well this morning woke up and ate breakfast and was walking around the home.  She developed a headache around 10 or 11 AM and then went into her room to lay down.  Throughout the neck several hours she became more more confused appearing.  She was not improving so EMS called and she was brought to the ED.   She was unable to give additional history or any additional review of systems and teleneurology was consulted. Taken from teleneurology note: "On teleneuro exam, patient is stuporous, nonverbal, not following most commands, with R gaze deviation, and generalized but L>R weakness. CT head shows a large intraparenchymal hemorrhage within the R parietal lobe as well as some R frontal subarachnoid hemorrhage. The cause for this hemorrhage is unclear, as patient has no known history of brain mets and she was not significantly hypertensive on arrival. Patient is quite tremulous/twitchy, raising some concern for seizure as well."     Last Known Well: 02/22/2021  11:00:00 Patient is not a candidate for Thrombolytic. Patient was not deemed candidate for Thrombolytic because of following reasons: Current ICH. Modified Rankin Scale: 0-Completely asymptomatic and back to baseline post- stroke NIHSS: 22   Hospital course:  Right parietal intraparenchymal hemorrhage Associated small frontal subarachnoid hemorrhage. Unclear etiology. Patient underwent MR venogram which was negative for cerebral venous sinus thrombosis. MRA head/neck/MRI brain negative for vascular malformations/mets. Transthoracic Echocardiogram without evidence of embolic source. Recommendation for outpatient neurology follow-up. Neurology recommendations: repeat MRI brain in 2-3 months, SBP goal <140 mmHg   Dysphagia Left-sided neglect Speech therapy consulted and MBS performed. Now with recommendation for regular solids.   Hypertension Goal SBP <140 per neurology. Patient was not on antihypertensive medication prior to admission. Started on Clevidipine initially and now transitioned to lisinopril. Continue Lisinopril and titrate up as able. Repeat BMP in 2 weeks to check creatinine and potassium.   Acute respiratory failure with hypoxia Patient required intubation on 6/18. She was successfully extubated on 6/20. Resolved.   Irregular heart rate Concern for possible paroxysmal atrial fibrillation   Stage IIb NSCLC Patient follows with Dr. Julien Nordmann as an outpatient. She is no longer on chemotherapy per discussion with oncologist.   Hypokalemia Repleted. Resolved.   History of bradycardia History of ventricular bigeminy Noted.   Hallucinations Unclear etiology. Does not seem related to medications since this started prior to admission. Discussed with neurology who recommended discontinuing Keppra. On chart review, it appears patient is taking Xanax TID but when discussed with her at bedside, she stated she was not taking it at this time; possible she could be having withdrawal  symptoms. Xanax restarted. If  no improvement, we discussed behavioral health consult/referra in the future.   Nausea Chronic issue. Phenergan very helpful in managing symptoms. Continue phenergan as an outpatient. Discontinue compazine.  Discharge Diagnoses:  Active Problems:   GERD (gastroesophageal reflux disease)   Hyperlipidemia with target low density lipoprotein (LDL) cholesterol less than 100 mg/dL   Intracranial hemorrhage ([place])   Stroke, 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[place])    Discharge Instructions   Allergies as of 02/27/2021       Reactions   Other Anaphylaxis   Spiders   Penicillins Hives   REACTION: 20 years        Medication List     STOP taking these medications    fluconazole 100 MG tablet Commonly known as: DIFLUCAN   guaiFENesin 600 MG 12 hr tablet Commonly known as: Mucinex   ibuprofen 800 MG tablet Commonly known as: ADVIL   LORazepam 0.5 MG tablet Commonly known as: ATIVAN   naproxen 375 MG tablet Commonly known as: NAPROSYN   ondansetron 8 MG tablet Commonly known as: ZOFRAN   prochlorperazine 10 MG tablet Commonly known as: COMPAZINE       TAKE these medications    acetaminophen 500 MG tablet Commonly known as: TYLENOL Take 2 tablets (1,000 mg total) by mouth every 6 (six) hours as needed. What changed: reasons to take this   albuterol 108 (90 Base) MCG/ACT inhaler Commonly known as: VENTOLIN HFA Inhale 2 puffs into the lungs every 6 (six) hours as needed for wheezing or shortness of breath.   alendronate 70 MG tablet Commonly known as: FOSAMAX Take 70 mg by mouth every Saturday.   ALPRAZolam 1 MG tablet Commonly known as: XANAX Take 1 mg by mouth in the morning, at noon, and at bedtime.   aspirin EC 81 MG tablet Take 81 mg by mouth daily. Swallow whole.   CALCIUM + D PO Take 1 tablet by mouth 2 (two) times daily.   cyclobenzaprine 5 MG tablet Commonly known as: FLEXERIL Take 5 mg by mouth 3 (three) times daily as needed for  muscle spasms (sciatic nerve).   folic acid 1 MG tablet Commonly known as: FOLVITE Take 1 tablet (1 mg total) by mouth daily. What changed: when to take this   lisinopril 10 MG tablet Commonly known as: ZESTRIL Take 1 tablet (10 mg total) by mouth daily. Start taking on: February 28, 2021   magnesium oxide 400 (241.3 Mg) MG tablet Commonly known as: MAG-OX Take 1 tablet (400 mg total) by mouth 2 (two) times daily.   omeprazole 40 MG capsule Commonly known as: PRILOSEC Take 40 mg by mouth 2 (two) times daily.   oxyCODONE-acetaminophen 10-325 MG tablet Commonly known as: PERCOCET Take 1 tablet by mouth every 4 (four) hours as needed for pain (sciatic nerve pain).   pregabalin 25 MG capsule Commonly known as: LYRICA Take 1 capsule (25 mg total) by mouth 2 (two) times daily.   promethazine 12.5 MG tablet Commonly known as: PHENERGAN Take 1 tablet (12.5 mg total) by mouth every 6 (six) hours as needed for nausea or vomiting.   rosuvastatin 10 MG tablet Commonly known as: CRESTOR Take 10 mg by mouth at bedtime.        Allergies  Allergen Reactions   Other Anaphylaxis    Spiders   Penicillins Hives    REACTION: 20 years    Consultations: Neurology Neurosurgery PCCM   Procedures/Studies: CT Chest W Contrast  Result Date: 02/05/2021 CLINICAL DATA:  Non-small-cell lung cancer. EXAM: CT CHEST  WITH CONTRAST TECHNIQUE: Multidetector CT imaging of the chest was performed during intravenous contrast administration. CONTRAST:  15mL OMNIPAQUE IOHEXOL 300 MG/ML  SOLN COMPARISON:  07/03/2020 FINDINGS: Cardiovascular: The heart size is normal. No substantial pericardial effusion. Atherosclerotic calcification is noted in the wall of the thoracic aorta. Mediastinum/Nodes: No mediastinal lymphadenopathy. Multinodular thyroid parenchyma again noted. There is no hilar lymphadenopathy. The esophagus has normal imaging features. There is no axillary lymphadenopathy. Lungs/Pleura: Interval  wedge resection of the right upper lobe lesion noted previously. There is volume loss/scarring along the staple line. Subpleural atelectasis/scarring noted right lower lobe with new small right pleural effusion. Left apical scarring is stable. Fiducial markers noted left upper lobe. 12 x 11 mm spiculated opacity seen in the left apex previously has decreased in the interval measuring 8 x 6 mm today. Tiny nodular opacity in the left lower lobe identified on the previous study has resolved in the interval. No new suspicious pulmonary nodule or mass. Upper Abdomen: Tiny subcapsular focus of hyperenhancement identified in the posterior right liver, likely a transient hepatic attenuation difference. Similar tiny focus of hyper enhancement identified in the subcapsular hepatic dome. Left renal cyst incompletely visualized. Intra and extrahepatic biliary duct dilatation again noted. Musculoskeletal: No worrisome lytic or sclerotic osseous abnormality. IMPRESSION: 1. Interval wedge resection of the right upper lobe lesion noted previously. No findings to suggest recurrent or metastatic disease in the chest. 2. Interval decrease in size of the spiculated left apical nodule. 3. Interval resolution of the left lower lobe pulmonary nodule. 4. New small right pleural effusion with right lower lobe atelectasis/scarring. 5. Tiny foci of hyperenhancement in the subcapsular liver likely benign but close attention on follow-up recommended to exclude metastatic disease. 6. Intra and extrahepatic biliary duct dilatation. Correlation with liver function test may prove helpful. 7. Aortic Atherosclerosis (ICD10-I70.0). Electronically Signed   By: Misty Stanley M.D.   On: 02/05/2021 08:23   MR ANGIO HEAD WO CONTRAST  Result Date: 02/23/2021 CLINICAL DATA:  Intracranial hemorrhage.  Lung cancer. EXAM: MRI HEAD WITHOUT AND WITH CONTRAST MRA HEAD WITHOUT CONTRAST TECHNIQUE: Multiplanar, multiecho pulse sequences of the brain and  surrounding structures were obtained without and with intravenous contrast. Angiographic images of the Circle of Willis were obtained using MRA technique without intravenous contrast. CONTRAST:  6.55mL GADAVIST GADOBUTROL 1 MMOL/ML IV SOLN COMPARISON:  CT head without contrast 02/22/2021. FINDINGS: MRI HEAD FINDINGS Brain: Medial right parietal lobe hemorrhage again seen. Fluid levels are present. Subdural and subarachnoid blood is present. No new hemorrhage is present. A small amount of blood is present within the ventricles. No hydrocephalus is present. No significant left-sided hemorrhage is present. No significant white matter disease is present. No enhancing mass lesion is present in association with the hemorrhage. There is some dural enhancement, likely reactive with the extra-axial hemorrhage. No parenchymal enhancement is present. The internal auditory canals are within normal limits. The brainstem and cerebellum are within normal limits. Vascular: Flow is present in the major intracranial arteries. Skull and upper cervical spine: The craniocervical junction is normal. Upper cervical spine is within normal limits. Marrow signal is unremarkable. Sinuses/Orbits: Right mastoid effusion is present. The paranasal sinuses and mastoid air cells are otherwise clear. The globes and orbits are within normal limits. MRA HEAD FINDINGS The internal carotid arteries are within normal limits from the high cervical segments through the ICA termini bilaterally. The A1 and M1 segments are normal. MCA bifurcations are within normal limits. ACA and MCA branch vessels are unremarkable.  Vertebral arteries are codominant. PICA origins are visualized and normal. Left AICA is visualized. Basilar artery is normal. The left posterior cerebral artery originates from the basilar tip. Mild left P1 narrowing is present. The right posterior cerebral artery is of fetal type. PCA branch vessels are within normal limits bilaterally.  IMPRESSION: 1. Stable appearance of right parietal hemorrhage without evidence for underlying mass lesion or vascular malformation. 2. Subdural and subarachnoid blood is stable. 3. Small amount of blood within the ventricles. 4. Normal variant MRA Circle of Willis without evidence for significant proximal stenosis, aneurysm, or branch vessel occlusion. Electronically Signed   By: San Morelle M.D.   On: 02/23/2021 14:49   MR ANGIO NECK W WO CONTRAST  Result Date: 02/23/2021 CLINICAL DATA:  Right parietal lobe hemorrhage. EXAM: MRA NECK WITHOUT AND WITH CONTRAST TECHNIQUE: Multiplanar and multiecho pulse sequences of the neck were obtained without and with intravenous contrast. Angiographic images of the neck were obtained using MRA technique without and with intravenous contrast. CONTRAST:  6.52mL GADAVIST GADOBUTROL 1 MMOL/ML IV SOLN COMPARISON:  MR head without contrast 02/23/2021. CT head without contrast 02/22/2021 FINDINGS: Time-of-flight images demonstrate no significant flow disturbance at either carotid bifurcation. Flow is antegrade in the vertebral arteries bilaterally. Postcontrast images demonstrate a 3 vessel arch configuration. No significant stenosis is present at the great vessel origins. The right common carotid artery is within normal limits. Bifurcation is unremarkable. Mild tortuosity is present cervical right ICA without significant stenosis. The left common carotid artery is within normal limits. Bifurcation is unremarkable. Mild tortuosity is present cervical left ICA without significant stenosis. The right vertebral artery is dominant. Both vertebral arteries originate from the subclavian arteries without significant stenosis. There is no significant stenosis or vascular injury to either IMPRESSION: 1. Mild tortuosity of the cervical internal carotid arteries bilaterally without significant stenosis. This is nonspecific, but can be seen in the setting of chronic hypertension. 2.  Otherwise normal MRA of the neck. Electronically Signed   By: San Morelle M.D.   On: 02/23/2021 15:06   MR BRAIN W WO CONTRAST  Result Date: 02/23/2021 CLINICAL DATA:  Intracranial hemorrhage.  Lung cancer. EXAM: MRI HEAD WITHOUT AND WITH CONTRAST MRA HEAD WITHOUT CONTRAST TECHNIQUE: Multiplanar, multiecho pulse sequences of the brain and surrounding structures were obtained without and with intravenous contrast. Angiographic images of the Circle of Willis were obtained using MRA technique without intravenous contrast. CONTRAST:  6.70mL GADAVIST GADOBUTROL 1 MMOL/ML IV SOLN COMPARISON:  CT head without contrast 02/22/2021. FINDINGS: MRI HEAD FINDINGS Brain: Medial right parietal lobe hemorrhage again seen. Fluid levels are present. Subdural and subarachnoid blood is present. No new hemorrhage is present. A small amount of blood is present within the ventricles. No hydrocephalus is present. No significant left-sided hemorrhage is present. No significant white matter disease is present. No enhancing mass lesion is present in association with the hemorrhage. There is some dural enhancement, likely reactive with the extra-axial hemorrhage. No parenchymal enhancement is present. The internal auditory canals are within normal limits. The brainstem and cerebellum are within normal limits. Vascular: Flow is present in the major intracranial arteries. Skull and upper cervical spine: The craniocervical junction is normal. Upper cervical spine is within normal limits. Marrow signal is unremarkable. Sinuses/Orbits: Right mastoid effusion is present. The paranasal sinuses and mastoid air cells are otherwise clear. The globes and orbits are within normal limits. MRA HEAD FINDINGS The internal carotid arteries are within normal limits from the high cervical segments through the  ICA termini bilaterally. The A1 and M1 segments are normal. MCA bifurcations are within normal limits. ACA and MCA branch vessels are  unremarkable. Vertebral arteries are codominant. PICA origins are visualized and normal. Left AICA is visualized. Basilar artery is normal. The left posterior cerebral artery originates from the basilar tip. Mild left P1 narrowing is present. The right posterior cerebral artery is of fetal type. PCA branch vessels are within normal limits bilaterally. IMPRESSION: 1. Stable appearance of right parietal hemorrhage without evidence for underlying mass lesion or vascular malformation. 2. Subdural and subarachnoid blood is stable. 3. Small amount of blood within the ventricles. 4. Normal variant MRA Circle of Willis without evidence for significant proximal stenosis, aneurysm, or branch vessel occlusion. Electronically Signed   By: San Morelle M.D.   On: 02/23/2021 14:49   MR Venogram Head  Result Date: 02/23/2021 CLINICAL DATA:  Right parietal lobe hemorrhage. EXAM: MR VENOGRAM HEAD WITHOUT CONTRAST TECHNIQUE: Angiographic images of the intracranial venous structures were acquired using MRV technique without intravenous contrast. COMPARISON:  MR head without and with contrast of the same day. CT head without contrast 02/22/2021 FINDINGS: Dural sinuses are patent. The right transverse sinus is dominant. Straight sinus deep cerebral veins are intact. Superior sagittal sinus is patent. No vascular malformation is present. IMPRESSION: Negative MRV of the head. Electronically Signed   By: San Morelle M.D.   On: 02/23/2021 14:58   DG Chest Portable 1 View  Result Date: 02/22/2021 CLINICAL DATA:  Check endotracheal tube placement EXAM: PORTABLE CHEST 1 VIEW COMPARISON:  12/24/2020 FINDINGS: Endotracheal tube is now seen 2 cm above the carina. Cardiac shadow is within normal limits. Aortic calcifications are seen. Left lung is clear. Volume loss in the right lung base is noted stable from the prior exam with small effusion. The overall appearance is unchanged. No bony abnormality is noted. IMPRESSION:  Endotracheal tube in satisfactory position. Stable chronic changes from April in the right base. Electronically Signed   By: Inez Catalina M.D.   On: 02/22/2021 22:45   DG Abd Portable 1 View  Result Date: 02/22/2021 CLINICAL DATA:  Check gastric catheter placement EXAM: PORTABLE ABDOMEN - 1 VIEW COMPARISON:  None. FINDINGS: Gastric catheter is noted within the stomach. Nonobstructive bowel gas pattern is noted. No bony abnormality is seen. IMPRESSION: Gastric catheter within the stomach. Electronically Signed   By: Inez Catalina M.D.   On: 02/22/2021 23:27   DG Swallowing Func-Speech Pathology  Result Date: 02/25/2021 Formatting of this result is different from the original. Objective Swallowing Evaluation: Type of Study: MBS-Modified Barium Swallow Study  Patient Details Name: PATINA SPANIER MRN: [id number] Date of Birth: 04-23-1946 Today's Date: 02/25/2021 Time: SLP Start Time (ACUTE ONLY): 1028 -SLP Stop Time (ACUTE ONLY): 5397 SLP Time Calculation (min) (ACUTE ONLY): 17 min Past Medical History: Past Medical History: Diagnosis Date  Adenoma of left adrenal gland   Cancer ([place])   Lung  Colon polyps   hyperplastic  Complication of anesthesia   Very emotional and cries after anesthesia  GERD (gastroesophageal reflux disease)   Kidney stone   Liver lesion   Microhematuria  Past Surgical History: Past Surgical History: Procedure Laterality Date  ABDOMINAL HYSTERECTOMY    APPENDECTOMY    BRONCHIAL BIOPSY  07/18/2020  Procedure: BRONCHIAL BIOPSIES;  Surgeon: [name], DO;  Location: [place] [facility];  Service: Pulmonary;;  BRONCHIAL BRUSHINGS  07/18/2020  Procedure: BRONCHIAL BRUSHINGS;  Surgeon: [name], DO;  Location: [place];  Service: Pulmonary;;  BRONCHIAL NEEDLE ASPIRATION BIOPSY  07/18/2020  Procedure: BRONCHIAL NEEDLE ASPIRATION BIOPSIES;  Surgeon: [name], DO;  Location: [place] [facility];  Service: Pulmonary;;  BRONCHIAL WASHINGS  07/18/2020  Procedure: BRONCHIAL WASHINGS;   Surgeon: [name], DO;  Location: [place] [facility];  Service: Pulmonary;;  FIDUCIAL MARKER PLACEMENT  07/18/2020  Procedure: FIDUCIAL MARKER PLACEMENT;  Surgeon: [name], DO;  Location: [place] [facility];  Service: Pulmonary;;  INTERCOSTAL NERVE BLOCK Right 08/26/2020  Procedure: INTERCOSTAL NERVE BLOCK;  Surgeon: [name], MD;  Location: [place];  Service: Thoracic;  Laterality: Right;  IR THORACENTESIS ASP PLEURAL SPACE W/IMG GUIDE  10/10/2020  NODE DISSECTION Right 08/26/2020  Procedure: NODE DISSECTION;  Surgeon: [name], MD;  Location: [place];  Service: Thoracic;  Laterality: Right;  THORACENTESIS Right 11/04/2020  Procedure: [facility];  Surgeon: [name], DO;  Location: [place];  Service: Pulmonary;  Laterality: Right;  THORACENTESIS Right 11/29/2020  Procedure: THORACENTESIS;  Surgeon: [name], DO;  Location[facility];  Service: Pulmonary;  Laterality: Right;  VIDEO BRONCHOSCOPY WITH ENDOBRONCHIAL NAVIGATION N/A 07/18/2020  Procedure: VIDEO BRONCHOSCOPY WITH ENDOBRONCHIAL NAVIGATION;  Surgeon: [name], DO;  Location: [place];  Service: Pulmonary;  Laterality: N/A;  VIDEO BRONCHOSCOPY WITH ENDOBRONCHIAL ULTRASOUND  07/18/2020  Procedure: VIDEO BRONCHOSCOPY WITH ENDOBRONCHIAL ULTRASOUND;  Surgeon: [name], DO;  Location: [facility];  Service: Pulmonary;; HPI: 75 yr old admitted with headache, confusion. Found to have CT head shows a large intraparenchymal hemorrhage within the R parietal lobe as well as some R frontal subarachnoid hemorrhage. PMH: lung cancer s/p R upper lobectomy, currently undergoing chemotherapy , GERD, kidney stone, adenoma of left adrenal gland  Subjective: alert, pleasant upright in chair for procedure Assessment / Plan / Recommendation CHL IP CLINICAL IMPRESSIONS 02/25/2021 Clinical Impression Pt presents with a mild oral and likely component of esophageal dysphagia.  Oral deficits c/b reduced bolus cohesion,  mild oral bolus delay, and mild oral residuals across POs. Second volitional swallows assisted in clearing oral cavity. Pharyngeal phase of the swallow was within functional limits. Airway protection was completly intact without laryngeal penetration or tracheal aspiration. Swallowing efficiency was also intact. Pt noted to have brief barium tablet stasis in mid esophagus with barium retention requiring additional liquid wash to pass to stomach. Esophageal barium reflux also exhibited. MBSS is not a test of the esophagus. If any esophageal clincial symptoms are exhibited, consider further esophageal workup. Recommend regular thin liquid diet. SLP to follow up. SLP Visit Diagnosis Dysphagia, oral phase (R13.11);Dysphagia, unspecified (R13.10) Attention and concentration deficit following -- Frontal lobe and executive function deficit following -- Impact on safety and function Mild aspiration risk   CHL IP TREATMENT RECOMMENDATION 02/25/2021 Treatment Recommendations Therapy as outlined in treatment plan below   Prognosis 02/25/2021 Prognosis for Safe Diet Advancement Good Barriers to Reach Goals Cognitive deficits Barriers/Prognosis Comment -- CHL IP DIET RECOMMENDATION 02/25/2021 SLP Diet Recommendations Regular solids;Thin liquid Liquid Administration via Cup;Straw Medication Administration Whole meds with liquid Compensations Minimize environmental distractions;Small sips/bites;Slow rate Postural Changes Remain semi-upright after after feeds/meals (Comment);Seated upright at 90 degrees   CHL IP OTHER RECOMMENDATIONS 02/25/2021 Recommended Consults -- Oral Care Recommendations Oral care BID Other Recommendations --   CHL IP FOLLOW UP RECOMMENDATIONS 02/25/2021 Follow up Recommendations Inpatient Rehab   CHL IP FREQUENCY AND DURATION 02/25/2021 Speech Therapy Frequency (ACUTE ONLY) min 2x/week Treatment Duration 2 weeks      CHL IP ORAL PHASE 02/25/2021 Oral Phase Impaired Oral - Pudding  Teaspoon -- Oral - Pudding Cup --  Oral - Honey Teaspoon -- Oral - Honey Cup -- Oral - Nectar Teaspoon -- Oral - Nectar Cup Delayed oral transit;Lingual/palatal residue Oral - Nectar Straw -- Oral - Thin Teaspoon -- Oral - Thin Cup Delayed oral transit;Lingual/palatal residue Oral - Thin Straw Lingual/palatal residue;Delayed oral transit Oral - Puree Lingual/palatal residue;Decreased bolus cohesion Oral - Mech Soft Lingual/palatal residue;Decreased bolus cohesion Oral - Regular -- Oral - Multi-Consistency -- Oral - Pill Lingual/palatal residue;Delayed oral transit;Reduced posterior propulsion;Lingual pumping Oral Phase - Comment --  CHL IP PHARYNGEAL PHASE 02/25/2021 Pharyngeal Phase Impaired Pharyngeal- Pudding Teaspoon -- Pharyngeal -- Pharyngeal- Pudding Cup -- Pharyngeal -- Pharyngeal- Honey Teaspoon -- Pharyngeal -- Pharyngeal- Honey Cup -- Pharyngeal -- Pharyngeal- Nectar Teaspoon -- Pharyngeal -- Pharyngeal- Nectar Cup Delayed swallow initiation-pyriform sinuses Pharyngeal -- Pharyngeal- Nectar Straw -- Pharyngeal -- Pharyngeal- Thin Teaspoon -- Pharyngeal -- Pharyngeal- Thin Cup Delayed swallow initiation-pyriform sinuses Pharyngeal -- Pharyngeal- Thin Straw Delayed swallow initiation-pyriform sinuses Pharyngeal -- Pharyngeal- Puree Delayed swallow initiation-vallecula Pharyngeal -- Pharyngeal- Mechanical Soft Delayed swallow initiation-vallecula Pharyngeal -- Pharyngeal- Regular -- Pharyngeal -- Pharyngeal- Multi-consistency -- Pharyngeal -- Pharyngeal- Pill Delayed swallow initiation-pyriform sinuses Pharyngeal -- Pharyngeal Comment --  CHL IP CERVICAL ESOPHAGEAL PHASE 02/25/2021 Cervical Esophageal Phase WFL Pudding Teaspoon -- Pudding Cup -- Honey Teaspoon -- Honey Cup -- Nectar Teaspoon -- Nectar Cup -- Nectar Straw -- Thin Teaspoon -- Thin Cup -- Thin Straw -- Puree -- Mechanical Soft -- Regular -- Multi-consistency -- Pill -- Cervical Esophageal Comment -- Chelsea E Hartness MA, CCC-SLP 02/25/2021, 11:10 AM              Overnight EEG  with video  Result Date: 02/24/2021 Lora Havens, MD     02/24/2021 10:31 AM Patient Name: Tonya [name] MRN: [id number] Epilepsy Attending: Lora Havens Referring Physician/Provider: Dr Lynnae Sandhoff Duration : 02/23/2021 1137 to 02/24/2021 1023 Patient history: 75yo F with right parietal IPH with small frontal subarachnoid hemorrhage, noted to have right upper extremity shaking. EEG to evaluate for  seizure Level of alertness: comatose AEDs during EEG study: LEV, Propofol Technical aspects: This EEG study was done with scalp electrodes positioned according to the 10-20 International system of electrode placement. Electrical activity was acquired at a sampling rate of 500Hz  and reviewed with a high frequency filter of 70Hz  and a low frequency filter of 1Hz . EEG data were recorded continuously and digitally stored. Description: EEG showed continuous generalized and lateralized right hemisphere polymorphic sharply contoured 3 to 6 Hz theta-delta slowing admixed with generalized 15 to 18 Hz beta activity. Sharp transients are also noted in right frontal region.  Hyperventilation and photic stimulation were not performed.   ABNORMALITY - Continuous slow, generalized and lateralized right hemisphere - Excessive beta, generalized IMPRESSION: This study is suggestive of cortical dysfunction in right hemisphere likely secondary to underlying stroke.  Additionally there is severe diffuse encephalopathy, nonspecific etiology but likely related to sedation. No seizures or definite epileptiform discharges were seen throughout the recording. Lora Havens   ECHOCARDIOGRAM COMPLETE  Result Date: 02/24/2021    ECHOCARDIOGRAM REPORT   Patient Name:   TRENAE BRUNKE Date of Exam: 02/24/2021 Medical Rec #:  [id number]                Height:       64.0 in Accession #:    [id number]               Weight:  137.6 lb Date of Birth:  1946-05-08                BSA:          1.669 m Patient Age:    34  years                 BP:           130/85 mmHg Patient Gender: F                        HR:           86 bpm. Exam Location:  Inpatient Procedure: 2D Echo, Cardiac Doppler and Color Doppler Indications:    Stroke  History:        Patient has no prior history of Echocardiogram examinations.                 Risk Factors:Former Smoker.  Sonographer:    Cammy Brochure Referring Phys: [id number] Gilbert  1. Left ventricular ejection fraction, by estimation, is 60 to 65%. The left ventricle has normal function. The left ventricle has no regional wall motion abnormalities. There is moderate left ventricular hypertrophy. Left ventricular diastolic parameters are consistent with Grade I diastolic dysfunction (impaired relaxation).  2. Right ventricular systolic function is normal. The right ventricular size is normal.  3. Left atrial size was mildly dilated.  4. The mitral valve is normal in structure. No evidence of mitral valve regurgitation.  5. The aortic valve is tricuspid. Aortic valve regurgitation is not visualized. Comparison(s): No prior Echocardiogram. FINDINGS  Left Ventricle: Left ventricular ejection fraction, by estimation, is 60 to 65%. The left ventricle has normal function. The left ventricle has no regional wall motion abnormalities. The left ventricular internal cavity size was normal in size. There is  moderate left ventricular hypertrophy. Left ventricular diastolic parameters are consistent with Grade I diastolic dysfunction (impaired relaxation). Indeterminate filling pressures. Right Ventricle: The right ventricular size is normal. No increase in right ventricular wall thickness. Right ventricular systolic function is normal. Left Atrium: Left atrial size was mildly dilated. Right Atrium: Right atrial size was normal in size. Pericardium: There is no evidence of pericardial effusion. Mitral Valve: The mitral valve is normal in structure. No evidence of mitral valve  regurgitation. Tricuspid Valve: The tricuspid valve is grossly normal. Tricuspid valve regurgitation is trivial. Aortic Valve: The aortic valve is tricuspid. Aortic valve regurgitation is not visualized. Aortic valve mean gradient measures 3.0 mmHg. Aortic valve peak gradient measures 6.4 mmHg. Aortic valve area, by VTI measures 2.67 cm. Pulmonic Valve: The pulmonic valve was normal in structure. Pulmonic valve regurgitation is not visualized. Aorta: The aortic root and ascending aorta are structurally normal, with no evidence of dilitation. Venous: IVC assessment for right atrial pressure unable to be performed due to mechanical ventilation. IAS/Shunts: No atrial level shunt detected by color flow Doppler.  LEFT VENTRICLE PLAX 2D LVIDd:         4.40 cm  Diastology LVIDs:         2.90 cm  LV e' medial:    7.83 cm/s LV PW:         1.30 cm  LV E/e' medial:  8.2 LV IVS:        1.50 cm  LV e' lateral:   8.49 cm/s LVOT diam:     2.00 cm  LV E/e' lateral: 7.5 LV SV:  58 LV SV Index:   35 LVOT Area:     3.14 cm  IVC IVC diam: 1.30 cm LEFT ATRIUM             Index       RIGHT ATRIUM           Index LA diam:        3.80 cm 2.28 cm/m  RA Area:     14.60 cm LA Vol (A2C):   66.0 ml 39.55 ml/m RA Volume:   35.70 ml  21.39 ml/m LA Vol (A4C):   52.8 ml 31.64 ml/m LA Biplane Vol: 59.4 ml 35.60 ml/m  AORTIC VALVE AV Area (Vmax):    2.88 cm AV Area (Vmean):   2.59 cm AV Area (VTI):     2.67 cm AV Vmax:           126.00 cm/s AV Vmean:          81.900 cm/s AV VTI:            0.216 m AV Peak Grad:      6.4 mmHg AV Mean Grad:      3.0 mmHg LVOT Vmax:         115.50 cm/s LVOT Vmean:        67.500 cm/s LVOT VTI:          0.184 m LVOT/AV VTI ratio: 0.85  AORTA Ao Root diam: 3.20 cm Ao Asc diam:  3.45 cm MITRAL VALVE MV Area (PHT): 4.06 cm    SHUNTS MV Decel Time: 187 msec    Systemic VTI:  0.18 m MV E velocity: 64.00 cm/s  Systemic Diam: 2.00 cm MV A velocity: 62.10 cm/s MV E/A ratio:  1.03 Lyman Bishop MD Electronically  signed by Lyman Bishop MD Signature Date/Time: 02/24/2021/10:20:56 AM    Final    CT HEAD CODE STROKE WO CONTRAST  Result Date: 02/22/2021 CLINICAL DATA:  Code stroke.  Right-sided weakness and headache EXAM: CT HEAD WITHOUT CONTRAST TECHNIQUE: Contiguous axial images were obtained from the base of the skull through the vertex without intravenous contrast. COMPARISON:  None. FINDINGS: Brain: Intraparenchymal hematoma centered in the high right parietal lobe measuring approximately 4.0 x 2.5 x 4.6 cm (volume = 24 cm^3). There is subarachnoid extension over the right convexity and into the basal cisterns. No midline shift or other mass effect. Vascular: No abnormal hyperdensity of the major intracranial arteries or dural venous sinuses. No intracranial atherosclerosis. Skull: The visualized skull base, calvarium and extracranial soft tissues are normal. Sinuses/Orbits: No fluid levels or advanced mucosal thickening of the visualized paranasal sinuses. No mastoid or middle ear effusion. The orbits are normal. IMPRESSION: 1. Intraparenchymal hematoma centered in the high right parietal lobe with subarachnoid extension over the right convexity and into the basal cisterns. 2. No midline shift or other mass effect. Critical Value/emergent results were called by telephone at the time of interpretation on 02/22/2021 at 9:08 pm to provider [facility] , who verbally acknowledged these results. Electronically Signed   By: Ulyses Jarred M.D.   On: 02/22/2021 21:09     Subjective: Headache resolved but she has had some hallucinations. She describes seeing her grandchildren and daughter in the room staring at her but not responding to her.  Discharge Exam: Vitals:   02/27/21 0821 02/27/21 1152  BP: (!) 177/83 (!) 163/84  Pulse: 76 85  Resp: 12 12  Temp: 98.7 F (37.1 C) 99.4 F (37.4 C)  SpO2: 99% 100%  Vitals:   02/27/21 0010 02/27/21 0255 02/27/21 0821 02/27/21 1152  BP:  (!) 148/74 (!) 177/83 (!) 163/84   Pulse: 81 79 76 85  Resp: 13 19 12 12   Temp:  99.6 F (37.6 C) 98.7 F (37.1 C) 99.4 F (37.4 C)  TempSrc:  Oral Oral Oral  SpO2: 93% 98% 99% 100%  Weight:      Height:        General exam: Appears calm and comfortable Respiratory system: Clear to auscultation. Respiratory effort normal. Cardiovascular system: S1 & S2 heard, RRR. No murmurs, rubs, gallops or clicks. Gastrointestinal system: Abdomen is nondistended, soft and nontender. No organomegaly or masses felt. Normal bowel sounds heard. Central nervous system: Alert and oriented. Musculoskeletal: No edema. No calf tenderness Skin: No cyanosis. No rashes Psychiatry: Judgement and insight appear normal. Mood & affect appropriate.    The results of significant diagnostics from this hospitalization (including imaging, microbiology, ancillary and laboratory) are listed below for reference.     Microbiology: Recent Results (from the past 240 hour(s))  Resp Panel by RT-PCR (Flu A&B, Covid) Nasopharyngeal Swab     Status: None   Collection Time: 02/22/21  8:46 PM   Specimen: Nasopharyngeal Swab; Nasopharyngeal(NP) swabs in vial transport medium  Result Value Ref Range Status   SARS Coronavirus 2 by RT PCR NEGATIVE NEGATIVE Final    Comment: (NOTE) SARS-CoV-2 target nucleic acids are NOT DETECTED.  The SARS-CoV-2 RNA is generally detectable in upper respiratory specimens during the acute phase of infection. The lowest concentration of SARS-CoV-2 viral copies this assay can detect is 138 copies/mL. A negative result does not preclude SARS-Cov-2 infection and should not be used as the sole basis for treatment or other patient management decisions. A negative result may occur with  improper specimen collection/handling, submission of specimen other than nasopharyngeal swab, presence of viral mutation(s) within the areas targeted by this assay, and inadequate number of viral copies(<138 copies/mL). A negative result must be  combined with clinical observations, patient history, and epidemiological information. The expected result is Negative.  Fact Sheet for Patients:  EntrepreneurPulse.com.au  Fact Sheet for Healthcare Providers:  IncredibleEmployment.be  This test is no t yet approved or cleared by the [place] FDA and  has been authorized for detection and/or diagnosis of SARS-CoV-2 by FDA under an Emergency Use Authorization (EUA). This EUA will remain  in effect (meaning this test can be used) for the duration of the COVID-19 declaration under Section 564(b)(1) of the Act, 21 U.S.C.section 360bbb-3(b)(1), unless the authorization is terminated  or revoked sooner.       Influenza A by PCR NEGATIVE NEGATIVE Final   Influenza B by PCR NEGATIVE NEGATIVE Final    Comment: (NOTE) The Xpert Xpress SARS-CoV-2/FLU/RSV plus assay is intended as an aid in the diagnosis of influenza from Nasopharyngeal swab specimens and should not be used as a sole basis for treatment. Nasal washings and aspirates are unacceptable for Xpert Xpress SARS-CoV-2/FLU/RSV testing.  Fact Sheet for Patients: EntrepreneurPulse.com.au  Fact Sheet for Healthcare Providers: IncredibleEmployment.be  This test is not yet approved or cleared by the [place] FDA and has been authorized for detection and/or diagnosis of SARS-CoV-2 by FDA under an Emergency Use Authorization (EUA). This EUA will remain in effect (meaning this test can be used) for the duration of the COVID-19 declaration under Section 564(b)(1) of the Act, 21 U.S.C. section 360bbb-3(b)(1), unless the authorization is terminated or revoked.  Performed at [facility], [place]  [address] [place], [place] [address]   MRSA Next Gen by PCR, Nasal     Status: None   Collection Time: 02/23/21  1:35 AM   Specimen: Nasal Mucosa; Nasal Swab  Result Value Ref Range Status    MRSA by PCR Next Gen NOT DETECTED NOT DETECTED Final    Comment: (NOTE) The GeneXpert MRSA Assay (FDA approved for NASAL specimens only), is one component of a comprehensive MRSA colonization surveillance program. It is not intended to diagnose MRSA infection nor to guide or monitor treatment for MRSA infections. Test performance is not FDA approved in patients less than 60 years old. Performed at [facility], [place] [address] [place], [place] [address]      Labs: BNP (last 3 results) Recent Labs    02/26/21 0403 02/27/21 0339  BNP 202.3* 330.0*   Basic Metabolic Panel: Recent Labs  Lab 02/22/21 2100 02/22/21 2120 02/23/21 0344 02/23/21 0852 02/25/21 0737 02/25/21 0807 02/26/21 0403 02/27/21 0339  NA 139 139  --  141 141  --  137 136  K 3.1* 3.3*  --  3.0* 3.4*  --  3.6 3.3*  CL 100 101  --  106 106  --  102 105  CO2 25  --   --  24 18*  --  21* 17*  GLUCOSE 150* 151*  --  132* 103*  --  94 98  BUN 10 8  --  8 10  --  18 18  CREATININE 0.89 0.70  --  0.75 0.66  --  0.87 0.96  CALCIUM 9.6  --   --  8.9 8.8*  --  9.3 9.3  MG  --   --  1.5*  --   --  1.4* 2.0 1.7  PHOS  --   --  2.8  --   --   --   --   --    Liver Function Tests: Recent Labs  Lab 02/22/21 2100 02/26/21 0403 02/27/21 0339  AST 27 19 21   ALT 12 12 14   ALKPHOS 80 70 69  BILITOT 0.7 1.1 1.5*  PROT 8.4* 6.4* 6.9  ALBUMIN 4.6 3.3* 3.4*   No results for input(s): LIPASE, AMYLASE in the last 168 hours. No results for input(s): AMMONIA in the last 168 hours. CBC: Recent Labs  Lab 02/22/21 2100 02/22/21 2120 02/26/21 0403 02/27/21 0339  WBC 10.7*  --  7.0 9.1  NEUTROABS 9.6*  --  4.8 6.7  HGB 11.0* 11.6* 9.6* 9.3*  HCT 32.8* 34.0* 28.6* 27.9*  MCV 97.9  --  98.6 98.9  PLT 237  --  243 296   Cardiac Enzymes: No results for input(s): CKTOTAL, CKMB, CKMBINDEX, TROPONINI in the last 168 hours. BNP: Invalid input(s): POCBNP CBG: Recent Labs  Lab 02/26/21 1103 02/26/21 1553  02/26/21 1926 02/26/21 2317 02/27/21 0413  GLUCAP 89 86 81 80 90   D-Dimer No results for input(s): DDIMER in the last 72 hours. Hgb A1c No results for input(s): HGBA1C in the last 72 hours. Lipid Profile No results for input(s): CHOL, HDL, LDLCALC, TRIG, CHOLHDL, LDLDIRECT in the last 72 hours. Thyroid function studies No results for input(s): TSH, T4TOTAL, T3FREE, THYROIDAB in the last 72 hours.  Invalid input(s): FREET3 Anemia work up No results for input(s): VITAMINB12, FOLATE, FERRITIN, TIBC, IRON, RETICCTPCT in the last 72 hours. Urinalysis    Component Value Date/Time   COLORURINE STRAW (A) 02/22/2021 2046   APPEARANCEUR CLEAR 02/22/2021 2046   LABSPEC 1.006  02/22/2021 2046   PHURINE 7.0 02/22/2021 2046   GLUCOSEU NEGATIVE 02/22/2021 2046   HGBUR SMALL (A) 02/22/2021 2046   BILIRUBINUR NEGATIVE 02/22/2021 2046   KETONESUR 20 (A) 02/22/2021 2046   PROTEINUR NEGATIVE 02/22/2021 2046   UROBILINOGEN 0.2 04/23/2007 1333   NITRITE NEGATIVE 02/22/2021 2046   LEUKOCYTESUR NEGATIVE 02/22/2021 2046   Sepsis Labs Invalid input(s): PROCALCITONIN,  WBC,  LACTICIDVEN Microbiology Recent Results (from the past 240 hour(s))  Resp Panel by RT-PCR (Flu A&B, Covid) Nasopharyngeal Swab     Status: None   Collection Time: 02/22/21  8:46 PM   Specimen: Nasopharyngeal Swab; Nasopharyngeal(NP) swabs in vial transport medium  Result Value Ref Range Status   SARS Coronavirus 2 by RT PCR NEGATIVE NEGATIVE Final    Comment: (NOTE) SARS-CoV-2 target nucleic acids are NOT DETECTED.  The SARS-CoV-2 RNA is generally detectable in upper respiratory specimens during the acute phase of infection. The lowest concentration of SARS-CoV-2 viral copies this assay can detect is 138 copies/mL. A negative result does not preclude SARS-Cov-2 infection and should not be used as the sole basis for treatment or other patient management decisions. A negative result may occur with  improper specimen  collection/handling, submission of specimen other than nasopharyngeal swab, presence of viral mutation(s) within the areas targeted by this assay, and inadequate number of viral copies(<138 copies/mL). A negative result must be combined with clinical observations, patient history, and epidemiological information. The expected result is Negative.  Fact Sheet for Patients:  EntrepreneurPulse.com.au  Fact Sheet for Healthcare Providers:  IncredibleEmployment.be  This test is no t yet approved or cleared by the [place] FDA and  has been authorized for detection and/or diagnosis of SARS-CoV-2 by FDA under an Emergency Use Authorization (EUA). This EUA will remain  in effect (meaning this test can be used) for the duration of the COVID-19 declaration under Section 564(b)(1) of the Act, 21 U.S.C.section 360bbb-3(b)(1), unless the authorization is terminated  or revoked sooner.       Influenza A by PCR NEGATIVE NEGATIVE Final   Influenza B by PCR NEGATIVE NEGATIVE Final    Comment: (NOTE) The Xpert Xpress SARS-CoV-2/FLU/RSV plus assay is intended as an aid in the diagnosis of influenza from Nasopharyngeal swab specimens and should not be used as a sole basis for treatment. Nasal washings and aspirates are unacceptable for Xpert Xpress SARS-CoV-2/FLU/RSV testing.  Fact Sheet for Patients: EntrepreneurPulse.com.au  Fact Sheet for Healthcare Providers: IncredibleEmployment.be  This test is not yet approved or cleared by the [place] FDA and has been authorized for detection and/or diagnosis of SARS-CoV-2 by FDA under an Emergency Use Authorization (EUA). This EUA will remain in effect (meaning this test can be used) for the duration of the COVID-19 declaration under Section 564(b)(1) of the Act, 21 U.S.C. section 360bbb-3(b)(1), unless the authorization is terminated or revoked.  Performed at [facility], [place] [address], [place],  [address]   MRSA Next Gen by PCR, Nasal     Status: None   Collection Time: 02/23/21  1:35 AM   Specimen: Nasal Mucosa; Nasal Swab  Result Value Ref Range Status   MRSA by PCR Next Gen NOT DETECTED NOT DETECTED Final    Comment: (NOTE) The GeneXpert MRSA Assay (FDA approved for NASAL specimens only), is one component of a comprehensive MRSA colonization surveillance program. It is not intended to diagnose MRSA infection nor to guide or monitor treatment for MRSA infections. Test performance is not FDA approved in patients  less than 80 years old. Performed at [facility], [place] [address], [place], [place] [address]      Time coordinating discharge: 35 minutes  SIGNED:   Cordelia Poche, MD [facility] 02/27/2021, 1:29 PM

## 2021-02-27 NOTE — H&P (Signed)
Physical Medicine and Rehabilitation Admission H&P    Chief Complaint  Patient presents with   Functional decline due to [place].    HPI: Tonya [name]. Tonya [name] is a 75 year old female with history of Lung cancer s/p RUL lobectomy with ongoing chemo and bradycardia in the past who was evaluated at OSH on 02/22/21 after presenting with reports of HA followed by confusion. Per teleneurology report patient progressed to "stuporous state, was non verbal with right gaze preference and had left>right sided weakness" CT head showed large intraparenchymal hemorrhage within right parietal lobe as well as mild right frontal subarachnoid hemorrhage and patient had tremulousness with concerns of seizure activity.  UDS positive for benzos and THC.  She was intubated for airway protection and transferred to [facility] for management.  CT head showed IPH in high parietal lobe with subarachnoid extension over right convexity and basal cisterns.  Dr. Venetia Constable consulted for input and recommended follow-up MRI/MRA to rule out underlying mass or vascular abnormality.   MRI/MRA brain done showing stable appearance of right parietal hemorrhage without underlying mass or vascular malformation, subdural and subarachnoid hemorrhage and small amount of blood within the ventricles without evidence of stenosis, aneurysm or occlusion.  MRV of the head was negative.  She was noted to have right upper extremity tremors on 06/19 and was started on Keppra.  EEG done showing cortical dysfunction right hemisphere secondary to underlying stroke and severe diffuse encephalopathy felt to be nonspecific but likely related to sedation.  She tolerated extubation without difficulty on 06/20 and MBS done showing mild esophageal dysphagia with intact airway protection therefore started on regular diet. She has had issues with tachycardia with heart rates up to 140s and beta-blocker added for rate control.  2D echo showed EF 60 to 65%  with moderate LVH and grade 1 DD.  Blood pressures noted to be elevated and medications added for better control. Dr. Leonie Man felt that bleed was likely hypertensive in nature but to repeat MRI brain in 2 to 3 months due to unclear etiology of bleed. Her lethargy has resolved and patient with resultant left-sided weakness with right gaze preference, balance deficits with left inattention and strong left lean as well as high-level cognitive deficits.  Therapy ongoing and CIR was recommended due to functional decline. Current alert and oriented x3 but confused as per daughter and husband.    Review of Systems  Constitutional:  Negative for chills and fever.  HENT:  Negative for hearing loss.   Eyes:  Negative for discharge.  Respiratory:  Negative for shortness of breath.   Cardiovascular:  Negative for chest pain, palpitations and leg swelling.  Gastrointestinal:  Negative for abdominal pain and nausea.  Genitourinary:  Negative for dysuria.  Musculoskeletal:  Positive for back pain.  Skin:  Negative for rash.  Neurological:  Positive for weakness. Negative for dizziness and headaches.  Psychiatric/Behavioral:  Positive for hallucinations and memory loss.     Past Medical History:  Diagnosis Date   Adenoma of left adrenal gland    Cancer (HCC)    Lung   Colon polyps    hyperplastic   Complication of anesthesia    Very emotional and cries after anesthesia   GERD (gastroesophageal reflux disease)    Kidney stone    Liver lesion    Microhematuria     Past Surgical History:  Procedure Laterality Date   ABDOMINAL HYSTERECTOMY     APPENDECTOMY     BRONCHIAL BIOPSY  07/18/2020   Procedure: BRONCHIAL BIOPSIES;  Surgeon: [name], DO;  Location: [place] [facility];  Service: Pulmonary;;   BRONCHIAL BRUSHINGS  07/18/2020   Procedure: BRONCHIAL BRUSHINGS;  Surgeon: [name], DO;  Location: [place];  Service: Pulmonary;;   BRONCHIAL NEEDLE ASPIRATION BIOPSY  07/18/2020    Procedure: BRONCHIAL NEEDLE ASPIRATION BIOPSIES;  Surgeon: [name], DO;  Location: [place] [facility];  Service: Pulmonary;;   BRONCHIAL WASHINGS  07/18/2020   Procedure: BRONCHIAL WASHINGS;  Surgeon: [name], DO;  Location: [place] [facility];  Service: Pulmonary;;   FIDUCIAL MARKER PLACEMENT  07/18/2020   Procedure: FIDUCIAL MARKER PLACEMENT;  Surgeon: [name], DO;  Location: [place] [facility];  Service: Pulmonary;;   INTERCOSTAL NERVE BLOCK Right 08/26/2020   Procedure: INTERCOSTAL NERVE BLOCK;  Surgeon: [name], MD;  Location: [place];  Service: Thoracic;  Laterality: Right;   IR THORACENTESIS ASP PLEURAL SPACE W/IMG GUIDE  10/10/2020   NODE DISSECTION Right 08/26/2020   Procedure: NODE DISSECTION;  Surgeon: [name], MD;  Location: [place];  Service: Thoracic;  Laterality: Right;   THORACENTESIS Right 11/04/2020   Procedure: Mathews Robinsons;  Surgeon: [name], DO;  Location: [place];  Service: Pulmonary;  Laterality: Right;   THORACENTESIS Right 11/29/2020   Procedure: THORACENTESIS;  Surgeon: [name], DO;  Location: [facility];  Service: Pulmonary;  Laterality: Right;   VIDEO BRONCHOSCOPY WITH ENDOBRONCHIAL NAVIGATION N/A 07/18/2020   Procedure: VIDEO BRONCHOSCOPY WITH ENDOBRONCHIAL NAVIGATION;  Surgeon: [name], DO;  Location: [place];  Service: Pulmonary;  Laterality: N/A;   VIDEO BRONCHOSCOPY WITH ENDOBRONCHIAL ULTRASOUND  07/18/2020   Procedure: VIDEO BRONCHOSCOPY WITH ENDOBRONCHIAL ULTRASOUND;  Surgeon: [name], DO;  Location: [facility];  Service: Pulmonary;;    Family History  Problem Relation Age of Onset   Arrhythmia Mother        s/p Pacer    Heart attack Father    Heart disease Sister    Colon cancer Neg Hx     Social History: Married. Independent PTA. Used to work for [employer]. She  reports that she quit smoking about 8 months ago. Her smoking use included cigarettes. She has a 18.00 pack-year smoking  history. She has never used smokeless tobacco. She reports current alcohol use--on rare occasions . She reports that she does not use drugs.   Allergies  Allergen Reactions   Other Anaphylaxis    Spiders   Penicillins Hives    REACTION: 20 years    Medications Prior to Admission  Medication Sig Dispense Refill   acetaminophen (TYLENOL) 500 MG tablet Take 2 tablets (1,000 mg total) by mouth every 6 (six) hours as needed. (Patient taking differently: Take 1,000 mg by mouth every 6 (six) hours as needed for mild pain or moderate pain.) 30 tablet 0   albuterol (VENTOLIN HFA) 108 (90 Base) MCG/ACT inhaler Inhale 2 puffs into the lungs every 6 (six) hours as needed for wheezing or shortness of breath. 8 g 2   ALPRAZolam (XANAX) 1 MG tablet Take 1 mg by mouth in the morning, at noon, and at bedtime.     aspirin EC 81 MG tablet Take 81 mg by mouth daily. Swallow whole.     cyclobenzaprine (FLEXERIL) 5 MG tablet Take 5 mg by mouth 3 (three) times daily as needed for muscle spasms (sciatic nerve).      folic acid (FOLVITE) 1 MG tablet Take 1 tablet (1 mg total) by mouth daily. (Patient taking  differently: Take 1 mg by mouth daily at 12 noon.) 30 tablet 4   magnesium oxide (MAG-OX) 400 (241.3 Mg) MG tablet Take 1 tablet (400 mg total) by mouth 2 (two) times daily. 60 tablet 1   naproxen (NAPROSYN) 375 MG tablet Take 1 tablet (375 mg total) by mouth 2 (two) times daily as needed for moderate pain. 8 tablet 0   oxyCODONE-acetaminophen (PERCOCET) 10-325 MG tablet Take 1 tablet by mouth every 4 (four) hours as needed for pain (sciatic nerve pain).     pregabalin (LYRICA) 25 MG capsule Take 1 capsule (25 mg total) by mouth 2 (two) times daily. 60 capsule 1   prochlorperazine (COMPAZINE) 10 MG tablet TAKE 1 TABLET(10 MG) BY MOUTH EVERY 6 HOURS AS NEEDED FOR NAUSEA OR VOMITING (Patient taking differently: Take 10 mg by mouth every 6 (six) hours as needed for nausea or vomiting.) 30 tablet 2   rosuvastatin  (CRESTOR) 10 MG tablet Take 10 mg by mouth at bedtime.     alendronate (FOSAMAX) 70 MG tablet Take 70 mg by mouth every Saturday.     Calcium Carbonate-Vitamin D (CALCIUM + D PO) Take 1 tablet by mouth 2 (two) times daily.     fluconazole (DIFLUCAN) 100 MG tablet Take 1 tablet (100 mg total) by mouth daily. 7 tablet 0   guaiFENesin (MUCINEX) 600 MG 12 hr tablet Take 2 tablets (1,200 mg total) by mouth 2 (two) times daily as needed. (Patient taking differently: Take 1,200 mg by mouth 2 (two) times daily as needed for to loosen phlegm.)     ibuprofen (ADVIL) 800 MG tablet Take 1 tablet (800 mg total) by mouth every 8 (eight) hours as needed for moderate pain. 30 tablet 0   LORazepam (ATIVAN) 0.5 MG tablet Take 1 tablet (0.5 mg total) by mouth daily as needed for anxiety. And nausea. 15 tablet 0   omeprazole (PRILOSEC) 40 MG capsule Take 40 mg by mouth 2 (two) times daily.     ondansetron (ZOFRAN) 8 MG tablet Take 1 tablet (8 mg total) by mouth every 8 (eight) hours as needed for nausea or vomiting. Starting at least 3 days after chemotherapy (Patient not taking: No sig reported) 30 tablet 1    Drug Regimen Review  Drug regimen was reviewed and remains appropriate with no significant issues identified  Home: Home Living Family/patient expects to be discharged to:: Private residence Living Arrangements: Spouse/significant other   Functional History:    Functional Status:  Mobility:          ADL:    Cognition: Cognition Orientation Level: Oriented to person, Oriented to place     Blood pressure (!) 146/76, pulse 82, temperature 99.7 F (37.6 C), resp. rate 16, height 5\' 4"  (1.626 m), weight 61.8 kg, SpO2 100 %. Physical Exam Vitals and nursing note reviewed.  Constitutional:      Appearance: She is well-developed.  Cardiovascular:     Rate and Rhythm: Tachycardia present.     Comments: Mild resting tachycardia with HR up to 110's with minimal activity.  Pulmonary: breathing  comfortably Abdomen: NT, ND Skin: intact Neurological:     Mental Status: She is alert and oriented to person, place, and time.     Comments: Speech soft but clear. Slow to process--was able to state date as June 23 but thought it was Wednesday. Hallucinating --talking about slug she put in the toilet (pointing to bed rail). Banging on pillow asking for meds from home. Lying sideways slumped to  the left and refused to reposition due to "bugs" in her bed. Mild left inattention with left hemiplegia.  Strength 3/5 left side, 5/5 right.  Psychiatric:        Cognition and Memory: Cognition is impaired.   Results for orders placed or performed during the hospital encounter of 02/22/21 (from the past 48 hour(s))  Glucose, capillary     Status: Abnormal   Collection Time: 02/25/21  7:17 PM  Result Value Ref Range   Glucose-Capillary 109 (H) 70 - 99 mg/dL    Comment: Glucose reference range applies only to samples taken after fasting for at least 8 hours.  Glucose, capillary     Status: Abnormal   Collection Time: 02/25/21 11:14 PM  Result Value Ref Range   Glucose-Capillary 101 (H) 70 - 99 mg/dL    Comment: Glucose reference range applies only to samples taken after fasting for at least 8 hours.  Glucose, capillary     Status: Abnormal   Collection Time: 02/26/21  3:13 AM  Result Value Ref Range   Glucose-Capillary 102 (H) 70 - 99 mg/dL    Comment: Glucose reference range applies only to samples taken after fasting for at least 8 hours.  Brain natriuretic peptide     Status: Abnormal   Collection Time: 02/26/21  4:03 AM  Result Value Ref Range   B Natriuretic Peptide 202.3 (H) 0.0 - 100.0 pg/mL    Comment: Performed at [facility] [address], [place], [place] [address]  CBC with Differential/Platelet     Status: Abnormal   Collection Time: 02/26/21  4:03 AM  Result Value Ref Range   WBC 7.0 4.0 - 10.5 K/uL   RBC 2.90 (L) 3.87 - 5.11 MIL/uL   Hemoglobin 9.6 (L) 12.0 - 15.0 g/dL    HCT 28.6 (L) 36.0 - 46.0 %   MCV 98.6 80.0 - 100.0 fL   MCH 33.1 26.0 - 34.0 pg   MCHC 33.6 30.0 - 36.0 g/dL   RDW 14.1 11.5 - 15.5 %   Platelets 243 150 - 400 K/uL   nRBC 0.0 0.0 - 0.2 %   Neutrophils Relative % 67 %   Neutro Abs 4.8 1.7 - 7.7 K/uL   Lymphocytes Relative 20 %   Lymphs Abs 1.4 0.7 - 4.0 K/uL   Monocytes Relative 10 %   Monocytes Absolute 0.7 0.1 - 1.0 K/uL   Eosinophils Relative 1 %   Eosinophils Absolute 0.0 0.0 - 0.5 K/uL   Basophils Relative 1 %   Basophils Absolute 0.0 0.0 - 0.1 K/uL   Immature Granulocytes 1 %   Abs Immature Granulocytes 0.04 0.00 - 0.07 K/uL    Comment: Performed at [facility] [address] [place], [place] [address]  Comprehensive metabolic panel     Status: Abnormal   Collection Time: 02/26/21  4:03 AM  Result Value Ref Range   Sodium 137 135 - 145 mmol/L   Potassium 3.6 3.5 - 5.1 mmol/L   Chloride 102 98 - 111 mmol/L   CO2 21 (L) 22 - 32 mmol/L   Glucose, Bld 94 70 - 99 mg/dL    Comment: Glucose reference range applies only to samples taken after fasting for at least 8 hours.   BUN 18 8 - 23 mg/dL   Creatinine, Ser 0.87 0.44 - 1.00 mg/dL   Calcium 9.3 8.9 - 10.3 mg/dL   Total Protein 6.4 (L) 6.5 - 8.1 g/dL   Albumin 3.3 (L) 3.5 -  5.0 g/dL   AST 19 15 - 41 U/L   ALT 12 0 - 44 U/L   Alkaline Phosphatase 70 38 - 126 U/L   Total Bilirubin 1.1 0.3 - 1.2 mg/dL   GFR, Estimated >60 >60 mL/min    Comment: (NOTE) Calculated using the CKD-EPI Creatinine Equation (2021)    Anion gap 14 5 - 15    Comment: Performed at [facility] [address] [place], [place] [address]  Magnesium     Status: None   Collection Time: 02/26/21  4:03 AM  Result Value Ref Range   Magnesium 2.0 1.7 - 2.4 mg/dL    Comment: Performed at [facility] [address], [place], [place] [address]  Glucose, capillary     Status: Abnormal   Collection Time: 02/26/21  7:18 AM  Result Value Ref Range   Glucose-Capillary 104 (H) 70 - 99  mg/dL    Comment: Glucose reference range applies only to samples taken after fasting for at least 8 hours.  Glucose, capillary     Status: None   Collection Time: 02/26/21 11:03 AM  Result Value Ref Range   Glucose-Capillary 89 70 - 99 mg/dL    Comment: Glucose reference range applies only to samples taken after fasting for at least 8 hours.  Glucose, capillary     Status: None   Collection Time: 02/26/21  3:53 PM  Result Value Ref Range   Glucose-Capillary 86 70 - 99 mg/dL    Comment: Glucose reference range applies only to samples taken after fasting for at least 8 hours.  Glucose, capillary     Status: None   Collection Time: 02/26/21  7:26 PM  Result Value Ref Range   Glucose-Capillary 81 70 - 99 mg/dL    Comment: Glucose reference range applies only to samples taken after fasting for at least 8 hours.  Glucose, capillary     Status: None   Collection Time: 02/26/21 11:17 PM  Result Value Ref Range   Glucose-Capillary 80 70 - 99 mg/dL    Comment: Glucose reference range applies only to samples taken after fasting for at least 8 hours.  Brain natriuretic peptide     Status: Abnormal   Collection Time: 02/27/21  3:39 AM  Result Value Ref Range   B Natriuretic Peptide 327.0 (H) 0.0 - 100.0 pg/mL    Comment: Performed at [facility] [address] [place], [place] [address]  CBC with Differential/Platelet     Status: Abnormal   Collection Time: 02/27/21  3:39 AM  Result Value Ref Range   WBC 9.1 4.0 - 10.5 K/uL   RBC 2.82 (L) 3.87 - 5.11 MIL/uL   Hemoglobin 9.3 (L) 12.0 - 15.0 g/dL   HCT 27.9 (L) 36.0 - 46.0 %   MCV 98.9 80.0 - 100.0 fL   MCH 33.0 26.0 - 34.0 pg   MCHC 33.3 30.0 - 36.0 g/dL   RDW 13.3 11.5 - 15.5 %   Platelets 296 150 - 400 K/uL   nRBC 0.0 0.0 - 0.2 %   Neutrophils Relative % 73 %   Neutro Abs 6.7 1.7 - 7.7 K/uL   Lymphocytes Relative 16 %   Lymphs Abs 1.5 0.7 - 4.0 K/uL   Monocytes Relative 10 %   Monocytes Absolute 0.9 0.1 - 1.0 K/uL    Eosinophils Relative 1 %   Eosinophils Absolute 0.1 0.0 - 0.5 K/uL   Basophils Relative 0 %   Basophils  Absolute 0.0 0.0 - 0.1 K/uL   Immature Granulocytes 0 %   Abs Immature Granulocytes 0.03 0.00 - 0.07 K/uL    Comment: Performed at [facility], [place] [address] [place], [place] [address]  Comprehensive metabolic panel     Status: Abnormal   Collection Time: 02/27/21  3:39 AM  Result Value Ref Range   Sodium 136 135 - 145 mmol/L   Potassium 3.3 (L) 3.5 - 5.1 mmol/L   Chloride 105 98 - 111 mmol/L   CO2 17 (L) 22 - 32 mmol/L   Glucose, Bld 98 70 - 99 mg/dL    Comment: Glucose reference range applies only to samples taken after fasting for at least 8 hours.   BUN 18 8 - 23 mg/dL   Creatinine, Ser 0.96 0.44 - 1.00 mg/dL   Calcium 9.3 8.9 - 10.3 mg/dL   Total Protein 6.9 6.5 - 8.1 g/dL   Albumin 3.4 (L) 3.5 - 5.0 g/dL   AST 21 15 - 41 U/L   ALT 14 0 - 44 U/L   Alkaline Phosphatase 69 38 - 126 U/L   Total Bilirubin 1.5 (H) 0.3 - 1.2 mg/dL   GFR, Estimated >60 >60 mL/min    Comment: (NOTE) Calculated using the CKD-EPI Creatinine Equation (2021)    Anion gap 14 5 - 15    Comment: Performed at [facility] [address], [place], [place] [address]  Magnesium     Status: None   Collection Time: 02/27/21  3:39 AM  Result Value Ref Range   Magnesium 1.7 1.7 - 2.4 mg/dL    Comment: Performed at [facility] [address] [place], [place] [address]  Glucose, capillary     Status: None   Collection Time: 02/27/21  4:13 AM  Result Value Ref Range   Glucose-Capillary 90 70 - 99 mg/dL    Comment: Glucose reference range applies only to samples taken after fasting for at least 8 hours.   No results found.     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  Antithrombotics: -DVT/anticoagulation:  Mechanical: Sequential compression devices, below knee Bilateral lower  extremities  -antiplatelet therapy: N/A 3. Headaches/Pain Management: Oxycodone prn for HA  --monitor for symptoms--may need Topamax.  4. Anxiety: LCSW to follow for evaluation and support. Recommended to continue to minimize use of Ativan due to negative effects of long term use on cognition.  -antipsychotic agents: N/A 5. Neuropsych: This patient is not capable of making decisions on her own behalf. 6. Skin/Wound Care: Routine pressure relief measures.  7. Fluids/Electrolytes/Nutrition: Monitor I/O. Check CMET in am.  8. HTN: Monitor BP tid--continue Lisinopril and metoprolol.  --Poorly controlled -->SBP goal<160 9. Seizure prophylaxis: As per discharge summary, neurology recommended discontinuing Keppra in case it was contributing to hallucinations, but states that hallucinations were present prior to admission and patient has been tremulous/twitchy raising some concern for seizure. 6/19 to 6/20 showed no evidence of seizures. Consider discontinuing 6/25 one week after ICH if remains seizure free.  10. Intermittent fevers: Being monitored and have resolved without treatment.  11. Anemia of chronic illness?: Continue to monitor H/H with serial checks--around 9 since 12/2020.  12. Hypomagnesemia: Resume home dose of supplement. 13. Hypokalemia: Will add daily supplement.  14. Delirium: thought to be secondary to benzodiazepine withdrawal but patient was only taking once per night  at home. UA negative. Hypokalemic and hypomagnesemia as above, electrolytes otherwise stable. Aox3. As per discharge summary, hallucinations were present prior to admission as well.   I have personally performed a face to face diagnostic evaluation, including, but not limited to relevant history and physical exam findings, of this patient and developed relevant assessment and plan.  Additionally, I have reviewed and concur with the physician assistant's documentation above.  Leeroy Cha, MD  Bary Leriche, PA-C

## 2021-02-27 NOTE — Progress Notes (Signed)
Inpatient Rehab Admissions Coordinator:    I have insurance approval and a bed available for pt to admit to CIR today. Dr. Teryl Lucy in agreement.  Will let pt/family and TOC team know.   Shann Medal, PT, DPT Admissions Coordinator [phone] 02/27/21  1:25 PM

## 2021-02-27 NOTE — Progress Notes (Signed)
Inpatient Rehabilitation Medication Review by a Pharmacist  A complete drug regimen review was completed for this patient to identify any potential clinically significant medication issues.  Clinically significant medication issues were identified:  yes   Type of Medication Issue Identified Description of Issue Urgent (address now) Non-Urgent (address on AM team rounds) Plan Plan Accepted by Provider? (Yes / No / Pending AM Rounds)  Drug Interaction(s) (clinically significant)       Duplicate Therapy       Allergy       No Medication Administration End Date       Incorrect Dose       Additional Drug Therapy Needed  Alendronate, pregabalin, rosuvastation - not continued on admission Non-urgent continue   Other         Name of provider notified for urgent issues identified:   Provider Method of Notification:    For non-urgent medication issues to be resolved on team rounds tomorrow morning a [facility] Secure Chat Handoff was sent to: Joselyn Glassman   Pharmacist comments:   Time spent performing this drug regimen review (minutes):  10 minutes   Tonya [name] 02/27/2021 10:00 PM

## 2021-02-28 DIAGNOSIS — I619 Nontraumatic intracerebral hemorrhage, unspecified: Secondary | ICD-10-CM

## 2021-02-28 DIAGNOSIS — S06340S Traumatic hemorrhage of right cerebrum without loss of consciousness, sequela: Secondary | ICD-10-CM

## 2021-02-28 LAB — COMPREHENSIVE METABOLIC PANEL
ALT: 13 U/L (ref 0–44)
AST: 19 U/L (ref 15–41)
Albumin: 3.3 g/dL — ABNORMAL LOW (ref 3.5–5.0)
Alkaline Phosphatase: 63 U/L (ref 38–126)
Anion gap: 12 (ref 5–15)
BUN: 13 mg/dL (ref 8–23)
CO2: 19 mmol/L — ABNORMAL LOW (ref 22–32)
Calcium: 9.2 mg/dL (ref 8.9–10.3)
Chloride: 104 mmol/L (ref 98–111)
Creatinine, Ser: 0.78 mg/dL (ref 0.44–1.00)
GFR, Estimated: >60 mL/min (ref >60)
Glucose, Bld: 105 mg/dL — ABNORMAL HIGH (ref 70–99)
Potassium: 3.3 mmol/L — ABNORMAL LOW (ref 3.5–5.1)
Sodium: 135 mmol/L (ref 135–145)
Total Bilirubin: 1.3 mg/dL — ABNORMAL HIGH (ref 0.3–1.2)
Total Protein: 6.4 g/dL — ABNORMAL LOW (ref 6.5–8.1)

## 2021-02-28 LAB — CBC WITH DIFFERENTIAL/PLATELET
Abs Immature Granulocytes: 0.03 10*3/uL (ref 0.00–0.07)
Basophils Absolute: 0 10*3/uL (ref 0.0–0.1)
Basophils Relative: 0 %
Eosinophils Absolute: 0 10*3/uL (ref 0.0–0.5)
Eosinophils Relative: 1 %
HCT: 27.7 % — ABNORMAL LOW (ref 36.0–46.0)
Hemoglobin: 9.5 g/dL — ABNORMAL LOW (ref 12.0–15.0)
Immature Granulocytes: 1 %
Lymphocytes Relative: 22 %
Lymphs Abs: 1.2 10*3/uL (ref 0.7–4.0)
MCH: 32.9 pg (ref 26.0–34.0)
MCHC: 34.3 g/dL (ref 30.0–36.0)
MCV: 95.8 fL (ref 80.0–100.0)
Monocytes Absolute: 0.8 10*3/uL (ref 0.1–1.0)
Monocytes Relative: 14 %
Neutro Abs: 3.4 10*3/uL (ref 1.7–7.7)
Neutrophils Relative %: 62 %
Platelets: 309 10*3/uL (ref 150–400)
RBC: 2.89 MIL/uL — ABNORMAL LOW (ref 3.87–5.11)
RDW: 13.5 % (ref 11.5–15.5)
WBC: 5.4 10*3/uL (ref 4.0–10.5)
nRBC: 0 % (ref 0.0–0.2)

## 2021-02-28 LAB — MAGNESIUM: Magnesium: 1.6 mg/dL — ABNORMAL LOW (ref 1.7–2.4)

## 2021-02-28 MED ORDER — TOPIRAMATE 25 MG PO TABS
25.0000 mg | ORAL_TABLET | Freq: Two times a day (BID) | ORAL | Status: DC
  Administered 2021-02-28 – 2021-03-02 (×5): 25 mg via ORAL
  Filled 2021-02-28 (×5): qty 1

## 2021-02-28 MED ORDER — SENNOSIDES-DOCUSATE SODIUM 8.6-50 MG PO TABS
1.0000 | ORAL_TABLET | Freq: Every day | ORAL | Status: DC
  Administered 2021-02-28 – 2021-03-24 (×24): 1 via ORAL
  Filled 2021-02-28 (×24): qty 1

## 2021-02-28 MED ORDER — ENSURE ENLIVE PO LIQD
237.0000 mL | Freq: Two times a day (BID) | ORAL | Status: DC
  Administered 2021-02-28 – 2021-03-05 (×11): 237 mL via ORAL

## 2021-02-28 NOTE — Progress Notes (Signed)
Initial Nutrition Assessment  DOCUMENTATION CODES:   Not applicable  INTERVENTION:  Provide Ensure Enlive po BID, each supplement provides 350 kcal and 20 grams of protein  Continue Juven BID, each packet provides 95 calories, 2.5 grams of protein.  Encourage adequate PO intake.   NUTRITION DIAGNOSIS:   Increased nutrient needs related to  (therapy) as evidenced by estimated needs.  GOAL:   Patient will meet greater than or equal to 90% of their needs  MONITOR:   PO intake, Supplement acceptance, Skin, Weight trends, Labs, I & O's  REASON FOR ASSESSMENT:   Malnutrition Screening Tool    ASSESSMENT:   75 year old female with history of Lung cancer s/p RUL lobectomy with ongoing chemo and bradycardia presenting with reports of HA followed by confusion. CT head showed large intraparenchymal hemorrhage within right parietal lobe as well as mild right frontal subarachnoid hemorrhage and patient had tremulousness with concerns of seizure activity. MRI/MRA brain done showing stable appearance of right parietal hemorrhage without underlying mass or vascular malformation, subdural and subarachnoid hemorrhage and small amount of blood within the ventricles without evidence of stenosis, aneurysm or occlusion. EEG done showing cortical dysfunction right hemisphere secondary to underlying stroke and severe diffuse encephalopathy. Bleed was likely hypertensive in nature but to repeat MRI brain in 2 to 3 months due to unclear etiology of bleed. CIR was recommended due to functional decline.  Pt asleep during time of visit and did not awaken to RD. Family at bedside reports pt has been sleeping most of the day today. Pt had not eating yet today. Family reports pt eating well PTA with usual consumption of at least 2 meals a day with multiple snacks in between. RD to order Ensure to aid in caloric and protein needs. Noted Juven ordered by MD. Will continue with current orders to aid in protein.  Family to encourage pt PO intake. Pt with a 5% weight loss over the past 2 months, not significant for time frame. Unable to complete Nutrition-Focused physical exam at this time.   Labs and medications reviewed.   Diet Order:   Diet Order             Diet heart healthy/carb modified Room service appropriate? Yes with Assist; Fluid consistency: Thin  Diet effective now                   EDUCATION NEEDS:   Not appropriate for education at this time  Skin:  Skin Assessment: Reviewed RN Assessment  Last BM:  6/21  Height:   Ht Readings from Last 1 Encounters:  02/27/21 5\' 4"  (1.626 m)    Weight:   Wt Readings from Last 1 Encounters:  02/27/21 61.8 kg   BMI:  Body mass index is 23.39 kg/m.  Estimated Nutritional Needs:   Kcal:  1850-2050  Protein:  85-100 grams  Fluid:  >/= 1.8 L/day  Corrin Parker, MS, RD, LDN RD pager number/after hours weekend pager number on Amion.

## 2021-02-28 NOTE — Progress Notes (Signed)
PROGRESS NOTE   Subjective/Complaints: Headache pain  No issues overnite , had 3 BMs this am , no abd pain  ROS- neg CP, SOB, N/V  Objective:   No results found. Recent Labs    02/27/21 0339 02/28/21 0454  WBC 9.1 5.4  HGB 9.3* 9.5*  HCT 27.9* 27.7*  PLT 296 309   Recent Labs    02/27/21 0339 02/28/21 0454  NA 136 135  K 3.3* 3.3*  CL 105 104  CO2 17* 19*  GLUCOSE 98 105*  BUN 18 13  CREATININE 0.96 0.78  CALCIUM 9.3 9.2    Intake/Output Summary (Last 24 hours) at 02/28/2021 0854 Last data filed at 02/28/2021 0700 Gross per 24 hour  Intake 30 ml  Output --  Net 30 ml        Physical Exam: Vital Signs Blood pressure (!) 172/73, pulse 92, temperature 97.7 F (36.5 C), temperature source Oral, resp. rate 20, height 5\' 4"  (1.626 m), weight 61.8 kg, SpO2 100 %.   General: No acute distress Mood and affect are appropriate Heart: Regular rate and rhythm no rubs murmurs or extra sounds Lungs: Clear to auscultation, breathing unlabored, no rales or wheezes Abdomen: Positive bowel sounds, soft nontender to palpation, nondistended Extremities: No clubbing, cyanosis, or edema Skin: No evidence of breakdown, no evidence of rash Neurologic: Alert and oriented  motor strength is 5/5 in right and 3- Leftdeltoid, bicep, tricep, grip, hip flexor, knee extensors, ankle dorsiflexor and plantar flexor Sensory exam normal sensation to light touch RIght and absent left  upper and lower extremities Cerebellar exam unable to perform on left due to weakness Musculoskeletal: Full range of motion in all 4 extremities. No joint swelling   Assessment/Plan: 1. Functional deficits which require 3+ hours per day of interdisciplinary therapy in a comprehensive inpatient rehab setting. Physiatrist is providing close team supervision and 24 hour management of active medical problems listed below. Physiatrist and rehab team continue  to assess barriers to discharge/monitor patient progress toward functional and medical goals  Care Tool:  Bathing              Bathing assist       Upper Body Dressing/Undressing Upper body dressing   What is the patient wearing?: Hospital gown only    Upper body assist Assist Level: Moderate Assistance - Patient 50 - 74%    Lower Body Dressing/Undressing Lower body dressing            Lower body assist       Toileting Toileting    Toileting assist Assist for toileting: 2 Helpers     Transfers Chair/bed transfer  Transfers assist     Chair/bed transfer assist level: 2 Helpers     Locomotion Ambulation   Ambulation assist              Walk 10 feet activity   Assist           Walk 50 feet activity   Assist           Walk 150 feet activity   Assist           Walk 10 feet on uneven  surface  activity   Assist           Wheelchair     Assist               Wheelchair 50 feet with 2 turns activity    Assist            Wheelchair 150 feet activity     Assist          Blood pressure (!) 172/73, pulse 92, temperature 97.7 F (36.5 C), temperature source Oral, resp. rate 20, height 5\' 4"  (1.626 m), weight 61.8 kg, SpO2 100 %.    Medical Problem List and Plan: 1. 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  Antithrombotics: -DVT/anticoagulation:  Mechanical: Sequential compression devices, below knee Bilateral lower extremities             -antiplatelet therapy: N/A 3. Headaches/Pain Management: Oxycodone prn for HA             --monitor for symptoms--may need Topamax. 4. Anxiety: LCSW to follow for evaluation and support. Recommended to continue to minimize use of Ativan due to negative effects of long term use on cognition.             -antipsychotic agents: N/A 5.  Neuropsych: This patient is not capable of making decisions on her own behalf. 6. Skin/Wound Care: Routine pressure relief measures. 7. Fluids/Electrolytes/Nutrition: Monitor I/O. Check CMET in am. 8. HTN: Monitor BP tid--continue Lisinopril and metoprolol. --Poorly controlled -->SBP goal<160 9. Seizure prophylaxis: As per discharge summary, neurology recommended discontinuing Keppra in case it was contributing to hallucinations, but states that hallucinations were present prior to admission and patient has been tremulous/twitchy raising some concern for seizure. 6/19 to 6/20 showed no evidence of seizures. Consider discontinuing 6/25 one week after ICH if remains seizure free.  10. Intermittent fevers: Being monitored and have resolved without treatment. 11. Anemia of chronic illness?: Continue to monitor H/H with serial checks--around 9 since 12/2020. 12. Hypomagnesemia: Resume home dose of supplement. 13. Hypokalemia: Will add daily supplement.just added KCL  14. Delirium: thought to be secondary to benzodiazepine withdrawal but patient was only taking once per night at home. UA negative. Hypokalemic and hypomagnesemia as above, electrolytes otherwise stable. Aox3. As per discharge summary, hallucinations were present prior to admission as well. May be CVA related , A and O this am   15.  Freq BM will reduce senna S to 1 po qhs 16.  Vascular HA add topiramate on 6/24 LOS: 1 days A FACE TO FACE EVALUATION WAS PERFORMED  Charlett Blake 02/28/2021, 8:54 AM

## 2021-02-28 NOTE — Progress Notes (Signed)
Inpatient Rehabilitation Care Coordinator Assessment and Plan Patient Details  Name: Tonya [name] MRN: [id number] Date of Birth: 11-08-1945  Today's Date: 02/28/2021  Hospital Problems: Principal Problem:   Intraparenchymal hematoma of brain [facility])  Past Medical History:  Past Medical History:  Diagnosis Date   Adenoma of left adrenal gland    Cancer ([place])    Lung   Colon polyps    hyperplastic   Complication of anesthesia    Very emotional and cries after anesthesia   GERD (gastroesophageal reflux disease)    Kidney stone    Liver lesion    Microhematuria    Past Surgical History:  Past Surgical History:  Procedure Laterality Date   ABDOMINAL HYSTERECTOMY     APPENDECTOMY     BRONCHIAL BIOPSY  07/18/2020   Procedure: BRONCHIAL BIOPSIES;  Surgeon: [name], DO;  Location: [place] [facility];  Service: Pulmonary;;   BRONCHIAL BRUSHINGS  07/18/2020   Procedure: BRONCHIAL BRUSHINGS;  Surgeon: [name], DO;  Location: [place];  Service: Pulmonary;;   BRONCHIAL NEEDLE ASPIRATION BIOPSY  07/18/2020   Procedure: BRONCHIAL NEEDLE ASPIRATION BIOPSIES;  Surgeon: [name], DO;  Location: [place];  Service: Pulmonary;;   BRONCHIAL WASHINGS  07/18/2020   Procedure: BRONCHIAL WASHINGS;  Surgeon: [name], DO;  Location: [place] [facility];  Service: Pulmonary;;   FIDUCIAL MARKER PLACEMENT  07/18/2020   Procedure: FIDUCIAL MARKER PLACEMENT;  Surgeon: [name], DO;  Location: [place] [facility];  Service: Pulmonary;;   INTERCOSTAL NERVE BLOCK Right 08/26/2020   Procedure: INTERCOSTAL NERVE BLOCK;  Surgeon: [name], MD;  Location: [place];  Service: Thoracic;  Laterality: Right;   IR THORACENTESIS ASP PLEURAL SPACE W/IMG GUIDE  10/10/2020   NODE DISSECTION Right 08/26/2020   Procedure: NODE DISSECTION;  Surgeon: [name], MD;  Location: [place];  Service: Thoracic;  Laterality: Right;   THORACENTESIS Right 11/04/2020    Procedure: [facility];  Surgeon: [name], DO;  Location: [place] [facility];  Service: Pulmonary;  Laterality: Right;   THORACENTESIS Right 11/29/2020   Procedure: THORACENTESIS;  Surgeon: [name], DO;  Location: [facility];  Service: Pulmonary;  Laterality: Right;   VIDEO BRONCHOSCOPY WITH ENDOBRONCHIAL NAVIGATION N/A 07/18/2020   Procedure: VIDEO BRONCHOSCOPY WITH ENDOBRONCHIAL NAVIGATION;  Surgeon: [name], DO;  Location: [place];  Service: Pulmonary;  Laterality: N/A;   VIDEO BRONCHOSCOPY WITH ENDOBRONCHIAL ULTRASOUND  07/18/2020   Procedure: VIDEO BRONCHOSCOPY WITH ENDOBRONCHIAL ULTRASOUND;  Surgeon: [name], DO;  Location: [place] [facility];  Service: Pulmonary;;   Social History:  reports that she quit smoking about 8 months ago. Her smoking use included cigarettes. She has a 18.00 pack-year smoking history. She has never used smokeless tobacco. She reports current alcohol use. She reports that she does not use drugs.  Family / Support Systems Marital Status: Married Patient Roles: Spouse Spouse/Significant Other: [name] [place] Children: Melissa Gaither, Sandford Craze Other Supports: co workers Anticipated Caregiver: [name] and daughters Ability/Limitations of Caregiver: Min A Caregiver Availability: 24/7 Family Dynamics: good family support. Aware unable to transition to SNF  Social History Preferred language: English Religion: Baptist Read: Yes Write: Yes Legal History/Current Legal Issues: n/a Guardian/Conservator: [name] HKVQQV(ZDGLOV)[phone] or [phone] and Cecille Rubin Randall([phone]   Abuse/Neglect Abuse/Neglect Assessment Can Be Completed: Yes Physical Abuse: Denies Verbal Abuse: Denies Sexual Abuse: Denies Exploitation of patient/patient's resources: Denies Self-Neglect: Denies  Emotional Status Pt's affect, behavior and adjustment status: pt very lathargic and tired Recent Psychosocial Issues: n/a Psychiatric  History: n/a Substance Abuse History: Tobacco use  Patient / Family Perceptions, Expectations & Goals Pt/Family understanding of illness & functional limitations: yes Premorbid pt/family roles/activities: Pt previously independent , drivng, shopping, etc Anticipated changes in roles/activities/participation: Spouse, and daughters to assist with roles and task Pt/family expectations/goals: Min A (W/C Level)/MOD  [employer]: None Premorbid Home Care/DME Agencies: Other (Comment) (Grab bars, shower seat) Transportation available at discharge: family able to transport  Discharge Planning Living Arrangements: Spouse/significant other Support Systems: Spouse/significant other, Children Type of Residence: Private residence (1 level home, 3 steps to enter) Administrator, sports: Multimedia programmer (specify) ([employer]) Financial Resources: SSD, Radio broadcast assistant Screen Referred: No Living Expenses: Own Money Management: Patient Does the patient have any problems obtaining your medications?: Yes (Describe) Home Management: Patient previosuly independent Patient/Family Preliminary Plans: Spouse and daughter able to assist with med and money mangement Care Coordinator Anticipated Follow Up Needs: HH/OP Expected length of stay: 28-30 Days  Clinical Impression SW entered pt's room. Pt visitors (coworkers) present at bedside. Introduced self, left copy of Statement of services for pt daughter, friends reports she will send a picture to pt daughter. SW stepped out room to call pt spouse, introduce self, and explain role. Addressed questions and concerns, pt spouse concerned about medication that could be potentially causing pt to be lethargic. No additional questions or concerns, sw will cont to follow up.   Dyanne Iha 02/28/2021, 12:35 PM

## 2021-02-28 NOTE — Progress Notes (Signed)
Patient was pleasantly confused overnight, visual hallucinations evident saying that "her 2 daughters are in the room sitting next to each other".  She is incontinent of bowels and bladder. Had a huge BM early this morning. Verbalized frustration about not having access to her cigarettes and coffee at 2.30am. Keeps forgetting she is in the hospital. Responds well to reassurance given. SBP 170 this morning, prn hydralazine given.

## 2021-02-28 NOTE — Progress Notes (Signed)
Inpatient Rehabilitation  Patient information reviewed and entered into eRehab system by Godwin Tedesco Tamu Golz, OTR/L.   Information including medical coding, functional ability and quality indicators will be reviewed and updated through discharge.    

## 2021-02-28 NOTE — Progress Notes (Signed)
Inpatient Rehabilitation Center Individual Statement of Services  Patient Name:  Tonya [name]  Date:  02/28/2021  Welcome to the [facility].  Our goal is to provide you with an individualized program based on your diagnosis and situation, designed to meet your specific needs.  With this comprehensive rehabilitation program, you will be expected to participate in at least 3 hours of rehabilitation therapies Monday-Friday, with modified therapy programming on the weekends.  Your rehabilitation program will include the following services:  Physical Therapy (PT), Occupational Therapy (OT), Speech Therapy (ST), 24 hour per day rehabilitation nursing, Therapeutic Recreaction (TR), Neuropsychology, Care Coordinator, Rehabilitation Medicine, Nutrition Services, Pharmacy Services, and Other  Weekly team conferences will be held on Wednesday to discuss your progress.  Your Inpatient Rehabilitation Care Coordinator will talk with you frequently to get your input and to update you on team discussions.  Team conferences with you and your family in attendance may also be held.  Expected length of stay: 28-30 Days  Overall anticipated outcome: Min/Mod  Depending on your progress and recovery, your program may change. Your Inpatient Rehabilitation Care Coordinator will coordinate services and will keep you informed of any changes. Your Inpatient Rehabilitation Care Coordinator's name and contact numbers are listed  below.  The following services may also be recommended but are not provided by the [facility]:   [employer] will be made to provide these services after discharge if needed.  Arrangements include referral to agencies that provide these services.  Your insurance has been verified to be:  [employer] Your primary doctor is:  Lin Landsman, MD  Pertinent information  will be shared with your doctor and your insurance company.  Inpatient Rehabilitation Care Coordinator:  Erlene Quan, [place] or [phone]  Information discussed with and copy given to patient by: Dyanne Iha, 02/28/2021, 11:49 AM

## 2021-02-28 NOTE — Evaluation (Signed)
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[place])   Past Medical History:  Past Medical History:  Diagnosis Date   Adenoma of left adrenal gland    Cancer ([place])    Lung   Colon polyps    hyperplastic   Complication of anesthesia    Very emotional and cries after anesthesia   GERD (gastroesophageal reflux disease)    Kidney stone    Liver lesion    Microhematuria    Past Surgical History:  Past Surgical History:  Procedure Laterality Date   ABDOMINAL HYSTERECTOMY     APPENDECTOMY     BRONCHIAL BIOPSY  07/18/2020   Procedure: BRONCHIAL BIOPSIES;  Surgeon: [name], DO;  Location: [facility];  Service: Pulmonary;;   BRONCHIAL BRUSHINGS  07/18/2020   Procedure: BRONCHIAL BRUSHINGS;  Surgeon: [name], DO;  Location: [facility];  Service: Pulmonary;;   BRONCHIAL NEEDLE ASPIRATION BIOPSY  07/18/2020   Procedure: BRONCHIAL NEEDLE ASPIRATION BIOPSIES;  Surgeon: [name], DO;  Location: [facility];  Service: Pulmonary;;   BRONCHIAL WASHINGS  07/18/2020   Procedure: BRONCHIAL WASHINGS;  Surgeon: [name], DO;  Location: [facility];  Service: Pulmonary;;   FIDUCIAL MARKER PLACEMENT  07/18/2020   Procedure: FIDUCIAL MARKER PLACEMENT;  Surgeon: [name], DO;  Location: [facility];  Service: Pulmonary;;   INTERCOSTAL NERVE BLOCK Right 08/26/2020   Procedure: INTERCOSTAL NERVE BLOCK;  Surgeon: [name], MD;  Location: [facility];  Service: Thoracic;   Laterality: Right;   IR THORACENTESIS ASP PLEURAL SPACE W/IMG GUIDE  10/10/2020   NODE DISSECTION Right 08/26/2020   Procedure: NODE DISSECTION;  Surgeon: [name], MD;  Location: [facility];  Service: Thoracic;  Laterality: Right;   THORACENTESIS Right 11/04/2020   Procedure: [facility];  Surgeon: [name], DO;  Location: [facility];  Service: Pulmonary;  Laterality: Right;   THORACENTESIS Right 11/29/2020   Procedure: THORACENTESIS;  Surgeon: [name], DO;  Location: [facility];  Service: Pulmonary;  Laterality: Right;   VIDEO BRONCHOSCOPY WITH ENDOBRONCHIAL NAVIGATION N/A 07/18/2020   Procedure: VIDEO BRONCHOSCOPY WITH ENDOBRONCHIAL NAVIGATION;  Surgeon: [name], DO;  Location: [facility];  Service: Pulmonary;  Laterality: N/A;   VIDEO BRONCHOSCOPY WITH ENDOBRONCHIAL ULTRASOUND  07/18/2020   Procedure: VIDEO BRONCHOSCOPY WITH ENDOBRONCHIAL ULTRASOUND;  Surgeon: [name], DO;  Location: [facility];  Service: Pulmonary;;    Assessment & Plan Clinical Impression: Patient is a 75 yo female presenting 6/18 with c/o R-sided headache. 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 PMH includes: lung cancer s/p R upper lobectomy, currently undergoing chemo, GERD, and HLD.  Patient transferred to CIR on 02/27/2021 .    Patient currently requires max with basic self-care skills secondary to muscle weakness, impaired timing and sequencing, abnormal tone, unbalanced muscle activation, decreased coordination, and decreased motor planning, decreased attention, decreased awareness, decreased problem solving, and decreased safety awareness, and decreased standing balance, decreased postural control, hemiplegia, and decreased balance strategies.  Prior to hospitalization, patient could complete ADL, IADLs and driving with independent .  Patient will benefit from skilled intervention to decrease level of assist with basic self-care skills prior to discharge  home with care partner.  Anticipate patient will require 24 hour supervision and follow up home health.  OT - End of Session Activity Tolerance: Tolerates 30+ min activity with multiple rests Endurance Deficit: Yes Endurance Deficit Description: multiple rest breaks required on eval OT Assessment Rehab Potential (ACUTE ONLY): Good OT Barriers to Discharge: Inaccessible home environment;Home environment access/layout;Decreased caregiver support OT Barriers to Discharge Comments: new cognitive deficits, currently requires max A for ADLs OT Patient demonstrates impairments in the following area(s): Balance;Cognition;Pain;Perception;Safety;Sensory;Endurance OT Basic ADL's Functional Problem(s): Eating;Grooming;Bathing;Dressing;Toileting OT Transfers Functional Problem(s): Toilet OT Additional Impairment(s): Fuctional Use of Upper Extremity OT Plan OT Intensity: Minimum of 1-2 x/day, 45 to 90 minutes OT Frequency: 5 out of 7 days OT Duration/Estimated Length of Stay: 2-3 weeks OT Treatment/Interventions: Balance/vestibular training;Cognitive remediation/compensation;Discharge planning;DME/adaptive equipment instruction;Functional mobility training;Functional electrical stimulation;Neuromuscular re-education;Pain management;Therapeutic Activities;UE/LE Strength taining/ROM;Visual/perceptual remediation/compensation;Wheelchair propulsion/positioning;UE/LE Coordination activities;Therapeutic Exercise;Self Care/advanced ADL retraining;Splinting/orthotics;Patient/family education OT Self Feeding Anticipated Outcome(s): independent OT Basic Self-Care Anticipated Outcome(s): supervision OT Toileting Anticipated Outcome(s): supervision OT Bathroom Transfers Anticipated Outcome(s): CGA OT Recommendation Recommendations for Other Services: Neuropsych consult Patient destination: Home Follow Up Recommendations: Home health OT Equipment Recommended: 3 in 1 bedside comode;To be determined   OT  Evaluation Precautions/Restrictions  Precautions Precautions: Fall Precaution Comments: L inattention, pushing to L Restrictions Weight Bearing Restrictions: No General Chart Reviewed: Yes Response to Previous Treatment: Patient with no complaints from previous session Family/Caregiver Present: Yes (friend present) Vital Signs Oxygen Therapy SpO2: 98 % O2 Device: Room Air Pain Pain Assessment Pain Scale: 0-10 Pain Score: 0-No pain Home Living/Prior Functioning Home Living Family/patient expects to be discharged to:: Private residence Living Arrangements: Spouse/significant other Available Help at Discharge: Family, Available 24 hours/day Type of Home: House Home Access: Stairs to enter Technical brewer of Steps: 3 Entrance Stairs-Rails: Right, Left Home Layout: One level Bathroom Shower/Tub: Gaffer (built in seat) Biochemist, clinical: Programmer, systems: Yes  Lives With: Spouse IADL History Homemaking Responsibilities: Yes Meal Prep Responsibility: Primary Laundry Responsibility: Primary Cleaning Responsibility: Primary Occupation: Retired Type of Occupation: Freight forwarder at [employer] Prior Function Level of Independence: Independent with basic ADLs, Independent with gait, Independent with homemaking with ambulation  Able to Take Stairs?: Yes Driving: Yes Comments: driving, retired Surveyor, mining Baseline Vision/History: Wears glasses Wears Glasses: At all times Patient Visual Report: Other (comment) (pt reports she has lost some peripheral vision in L eye) Vision Assessment?: Vision impaired- to be further tested in functional context Additional Comments: continue to assess, pt noted to have L side inattention vs. L visual field cut on eval Perception  Perception: Impaired Praxis Praxis: Impaired Cognition Overall Cognitive Status: Impaired/Different from baseline Arousal/Alertness: Awake/alert Orientation Level:  Person;Place;Situation Person: Oriented Place: Oriented Situation: Oriented Year: 2022 Month: June Day of Week: Correct Memory: Appears intact Memory Impairment: Storage deficit;Retrieval deficit Immediate Memory Recall: Sock;Blue;Bed Memory Recall Sock: Without Cue Memory Recall Blue: Without Cue Memory Recall Bed: Without Cue Attention: Sustained Sustained Attention: Impaired Awareness: Impaired Awareness Impairment: Emergent impairment;Anticipatory impairment;Intellectual impairment Problem Solving: Impaired Problem Solving Impairment: Verbal basic Safety/Judgment: Impaired Sensation Sensation Light Touch: Impaired by gross assessment Light Touch Impaired Details: Impaired LUE Proprioception: Impaired by gross assessment Stereognosis: Impaired by gross assessment Coordination Gross Motor Movements are Fluid and Coordinated: No Fine Motor Movements are Fluid and Coordinated: No Motor  Motor Motor: Hemiplegia;Abnormal tone;Motor apraxia;Abnormal postural alignment and control  Trunk/Postural Assessment  Cervical  Assessment Cervical Assessment: Exceptions to [facility] (fwd head) Thoracic Assessment Thoracic Assessment: Exceptions to [facility] (mild kyphosis, lateral lean to L side) Lumbar Assessment Lumbar Assessment: Exceptions to [facility] (posterior pelvic tilt) Postural Control Postural Control: Deficits on evaluation  Balance Balance Balance Assessed: Yes Static Sitting Balance Static Sitting - Balance Support: No upper extremity supported Static Sitting - Level of Assistance: 5: Stand by assistance Static Standing Balance Static Standing - Balance Support: Bilateral upper extremity supported Static Standing - Level of Assistance: 2: Max assist Static Standing - Comment/# of Minutes: severe pushing toward L side when standing. therapist max A to hold/remain upright Extremity/Trunk Assessment RUE Assessment RUE Assessment: Within Functional Limits LUE Assessment LUE  Assessment: Exceptions to [facility] Strength Comments: able to form gross grasp, 2- shoulder shoulder flexion, 2- elbow flexion LUE Body System: Neuro  Care Tool Care Tool Self Care Eating   Eating Assist Level: Set up assist    Oral Care  Oral care, brush teeth, clean dentures activity did not occur: Refused      Bathing   Body parts bathed by patient: Left arm;Chest;Abdomen;Front perineal area;Right upper leg;Left upper leg;Face Body parts bathed by helper: Right arm;Buttocks;Right lower leg;Left lower leg   Assist Level: Moderate Assistance - Patient 50 - 74%    Upper Body Dressing(including orthotics)   What is the patient wearing?: Pull over shirt   Assist Level: Maximal Assistance - Patient 25 - 49%    Lower Body Dressing (excluding footwear)   What is the patient wearing?: Pants Assist for lower body dressing: Maximal Assistance - Patient 25 - 49%    Putting on/Taking off footwear   What is the patient wearing?: Non-skid slipper socks Assist for footwear: Total Assistance - Patient < 25%       Care Tool Toileting Toileting activity   Assist for toileting: 2 Helpers (2 helpers to manage clothes and and perform hygiene when standing)     Care Tool Bed Mobility Roll left and right activity    Not tested    Sit to lying activity    Not tested    Lying to sitting edge of bed activity   Lying to sitting edge of bed assist level: Moderate Assistance - Patient 50 - 74%     Care Tool Transfers Sit to stand transfer   Sit to stand assist level: Moderate Assistance - Patient 50 - 74%    Chair/bed transfer   Chair/bed transfer assist level: Maximal Assistance - Patient 25 - 49%     Toilet transfer   Assist Level: Maximal Assistance - Patient 24 - 49%     Care Tool Cognition Expression of Ideas and Wants Expression of Ideas and Wants: Some difficulty - exhibits some difficulty with expressing needs and ideas (e.g, some words or finishing thoughts) or speech is  not clear   Understanding Verbal and Non-Verbal Content Understanding Verbal and Non-Verbal Content: Usually understands - understands most conversations, but misses some part/intent of message. Requires cues at times to understand   Memory/Recall Ability *first 3 days only Memory/Recall Ability *first 3 days only: That he or she is in a hospital/hospital unit;Current season    Refer to Care Plan for Long Term Goals  SHORT TERM GOAL WEEK 1 OT Short Term Goal 1 (Week 1): Pt will complete UB dressing with min A and use of hemi dressing techs prn to increase overall ADL independence. OT Short Term Goal 2 (Week 1): Pt will complete LB dressing with mod A  and AE prn. OT Short Term Goal 3 (Week 1): Pt will complete 3/3 toileting tasks on commode with mod A. OT Short Term Goal 4 (Week 1): Pt will complete functional transfers to all appropriate surfaces with LRAD with min A.  Recommendations for other services: Neuropsych   Skilled Therapeutic Intervention  Pt received in bed with friend and MD present. Pt consented to OT eval and tx. Pt notices to be slouched to her R side while in the bed, req max A to sit EOB. Pt sat EOB with close SUP for static sitting balance. Session initiated with OT eval and then transitioned to self care retraining. Pt's friend exited room for self care tasks. Pt demo's severe motor planning and sequencing deficits during functional tasks, L side inattention and L side visual field loss. When pt is standing or transferring, she pushes to L side, but when in bed or in the w/c she seems to slouch over to her R side. During eval, pt goes back and forth between thinking she is at her own home and remembering she is at the hospital. She understands why she is here however has decreased insight into her functional deficits. Attempted to help pt into shower, but pt was unable to follow therapist's commands for proper sequencing for shower transfer on eval. Deemed it was safer for the  time being to wash up sink side. After tx, pt left up in w/c with pillows in place for comfortable position, seatbelt alarm on, call light in reach and all needs met. Pt's friends came back into room shortly after eval.    ADL ADL Eating: Set up Where Assessed-Eating: Chair Grooming: Minimal assistance Where Assessed-Grooming: Sitting at sink Upper Body Bathing: Moderate assistance Where Assessed-Upper Body Bathing: Sitting at sink Lower Body Bathing: Maximal assistance Where Assessed-Lower Body Bathing: Sitting at sink Upper Body Dressing: Moderate cueing;Maximal assistance Where Assessed-Upper Body Dressing: Sitting at sink Lower Body Dressing: Maximal assistance Where Assessed-Lower Body Dressing: Sitting at sink;Standing at sink Toileting: Maximal assistance Where Assessed-Toileting: Glass blower/designer: Maximal Print production planner Method: Arts development officer: Energy manager Method: Unable to assess [employer] Transfer: Unable to assess [employer] Method: Unable to assess Mobility  Transfers Sit to Stand: Moderate Assistance - Patient 50-74%   Discharge Criteria: Patient will be discharged from OT if patient refuses treatment 3 consecutive times without medical reason, if treatment goals not met, if there is a change in medical status, if patient makes no progress towards goals or if patient is discharged from hospital.  The above assessment, treatment plan, treatment alternatives and goals were discussed and mutually agreed upon: by patient  Paulette Blanch 02/28/2021, 12:20 PM

## 2021-02-28 NOTE — Evaluation (Signed)
Physical Therapy Assessment and Plan  Patient Details  Name: Tonya [name] MRN: [id number] Date of Birth: 10/02/1945  PT Diagnosis: Abnormal posture, Coordination disorder, Difficulty walking, Hemiparesis non-dominant, Hypotonia, Impaired cognition, Impaired sensation, and Muscle weakness Rehab Potential: Good ELOS: 21-24 days   Today's Date: 02/28/2021 PT Individual Time: 6712-4580 PT Individual Time Calculation (min): 69 min    Hospital Problem: Principal Problem:   Intraparenchymal hematoma of brain ([place])   Past Medical History:  Past Medical History:  Diagnosis Date   Adenoma of left adrenal gland    Cancer ([place])    Lung   Colon polyps    hyperplastic   Complication of anesthesia    Very emotional and cries after anesthesia   GERD (gastroesophageal reflux disease)    Kidney stone    Liver lesion    Microhematuria    Past Surgical History:  Past Surgical History:  Procedure Laterality Date   ABDOMINAL HYSTERECTOMY     APPENDECTOMY     BRONCHIAL BIOPSY  07/18/2020   Procedure: BRONCHIAL BIOPSIES;  Surgeon: [name], DO;  Location: [place];  Service: Pulmonary;;   BRONCHIAL BRUSHINGS  07/18/2020   Procedure: BRONCHIAL BRUSHINGS;  Surgeon: [name], DO;  Location: [place];  Service: Pulmonary;;   BRONCHIAL NEEDLE ASPIRATION BIOPSY  07/18/2020   Procedure: BRONCHIAL NEEDLE ASPIRATION BIOPSIES;  Surgeon: [name], DO;  Location: [place];  Service: Pulmonary;;   BRONCHIAL WASHINGS  07/18/2020   Procedure: BRONCHIAL WASHINGS;  Surgeon: [name], DO;  Location: [place] [facility];  Service: Pulmonary;;   FIDUCIAL MARKER PLACEMENT  07/18/2020   Procedure: FIDUCIAL MARKER PLACEMENT;  Surgeon: [name], DO;  Location: [place] [facility];  Service: Pulmonary;;   INTERCOSTAL NERVE BLOCK Right 08/26/2020   Procedure: INTERCOSTAL NERVE BLOCK;  Surgeon: [name], MD;  Location: [place];  Service: Thoracic;  Laterality:  Right;   IR THORACENTESIS ASP PLEURAL SPACE W/IMG GUIDE  10/10/2020   NODE DISSECTION Right 08/26/2020   Procedure: NODE DISSECTION;  Surgeon: [name], MD;  Location: [place];  Service: Thoracic;  Laterality: Right;   THORACENTESIS Right 11/04/2020   Procedure: [facility];  Surgeon: [name], DO;  Location: [facility];  Service: Pulmonary;  Laterality: Right;   THORACENTESIS Right 11/29/2020   Procedure: THORACENTESIS;  Surgeon: [name], DO;  Location: [facility];  Service: Pulmonary;  Laterality: Right;   VIDEO BRONCHOSCOPY WITH ENDOBRONCHIAL NAVIGATION N/A 07/18/2020   Procedure: VIDEO BRONCHOSCOPY WITH ENDOBRONCHIAL NAVIGATION;  Surgeon: [name], DO;  Location: [place];  Service: Pulmonary;  Laterality: N/A;   VIDEO BRONCHOSCOPY WITH ENDOBRONCHIAL ULTRASOUND  07/18/2020   Procedure: VIDEO BRONCHOSCOPY WITH ENDOBRONCHIAL ULTRASOUND;  Surgeon: [name], DO;  Location: [facility];  Service: Pulmonary;;    Assessment & Plan Clinical Impression: Patient is a 75 y.o. female with h/o Lung cancer s/p RUL lobectomy with ongoing chemo and bradycardia in the past who was evaluated at OSH on 02/22/21 after presenting with reports of HA followed by confusion. Per teleneurology report patient progressed to "stuporous state, was non verbal with right gaze preference and had left>right sided weakness" CT head showed large intraparenchymal hemorrhage within right parietal lobe as well as mild right frontal subarachnoid hemorrhage and patient had tremulousness with concerns of seizure activity.  UDS positive for benzos and THC.  She was intubated for airway protection and transferred to [facility] for management.  CT head showed IPH in high parietal lobe with  subarachnoid extension over right convexity and basal cisterns.  Dr. Venetia Constable consulted for input and recommended follow-up MRI/MRA to rule out underlying mass or vascular abnormality.    MRI/MRA brain done showing stable appearance of right parietal hemorrhage without underlying mass or vascular malformation, subdural and subarachnoid hemorrhage and small amount of blood within the ventricles without evidence of stenosis, aneurysm or occlusion.  MRV of the head was negative.  She was noted to have right upper extremity tremors on 06/19 and was started on Keppra.  EEG done showing cortical dysfunction right hemisphere secondary to underlying stroke and severe diffuse encephalopathy felt to be nonspecific but likely related to sedation.  She tolerated extubation without difficulty on 06/20 and MBS done showing mild esophageal dysphagia with intact airway protection therefore started on regular diet. She has had issues with tachycardia with heart rates up to 140s and beta-blocker added for rate control.  2D echo showed EF 60 to 65% with moderate LVH and grade 1 DD.  Blood pressures noted to be elevated and medications added for better control. Dr. Leonie Man felt that bleed was likely hypertensive in nature but to repeat MRI brain in 2 to 3 months due to unclear etiology of bleed. Her lethargy has resolved and patient with resultant left-sided weakness with right gaze preference, balance deficits with left inattention and strong left lean as well as high-level cognitive deficits.  Therapy ongoing and CIR was recommended due to functional decline..  Patient transferred to CIR on 02/27/2021 .   Patient currently requires overall max A with mobility secondary to muscle weakness, decreased cardiorespiratoy endurance, impaired timing and sequencing, abnormal tone, unbalanced muscle activation, motor apraxia, decreased coordination, and decreased motor planning, decreased visual perceptual skills and field cut, decreased midline orientation, decreased attention to left, left side neglect, and decreased motor planning, decreased initiation, decreased attention, decreased awareness, decreased problem solving,  decreased safety awareness, decreased memory, and delayed processing, and decreased sitting balance, decreased standing balance, decreased postural control, hemiplegia, and decreased balance strategies.  Prior to hospitalization, patient was independent  with mobility and lived with Spouse in a House home.  Home access is 3Stairs to enter.  Patient will benefit from skilled PT intervention to maximize safe functional mobility, minimize fall risk, and decrease caregiver burden for planned discharge home with 24 hour assist.  Anticipate patient will benefit from follow up [facility] at discharge.  PT - End of Session Activity Tolerance: Tolerates 10 - 20 min activity with multiple rests Endurance Deficit: Yes Endurance Deficit Description: multiple rest breaks required on eval PT Assessment Rehab Potential (ACUTE/IP ONLY): Good PT Barriers to Discharge: Decreased caregiver support;Incontinence;Lack of/limited family support;Insurance for SNF coverage;Medication compliance;Behavior PT Barriers to Discharge Comments: reduced cognition and limited strength/ motor control to L increases burden on caregivers PT Patient demonstrates impairments in the following area(s): Balance;Edema;Endurance;Motor;Perception;Safety;Sensory PT Transfers Functional Problem(s): Bed Mobility;Bed to Chair;Car;Furniture PT Locomotion Functional Problem(s): Ambulation;Wheelchair Mobility;Stairs PT Plan PT Intensity: Minimum of 1-2 x/day ,45 to 90 minutes PT Frequency: 5 out of 7 days PT Duration Estimated Length of Stay: 21-24 days PT Treatment/Interventions: Ambulation/gait training;Balance/vestibular training;Cognitive remediation/compensation;Community reintegration;Discharge planning;Disease management/prevention;DME/adaptive equipment instruction;Functional electrical stimulation;Functional mobility training;Neuromuscular re-education;Pain management;Patient/family education;Psychosocial support;Splinting/orthotics;Stair  training;Therapeutic Activities;Therapeutic Exercise;UE/LE Strength taining/ROM;UE/LE Coordination activities;Visual/perceptual remediation/compensation;Wheelchair propulsion/positioning PT Transfers Anticipated Outcome(s): Overall supervision PT Locomotion Anticipated Outcome(s): CGA/ Min A with LRAD PT Recommendation Recommendations for Other Services: Therapeutic Recreation consult Therapeutic Recreation Interventions: Stress management;Outing/community reintergration Follow Up Recommendations: Home health PT;24 hour supervision/assistance Patient destination: Home Equipment Recommended: To  be determined   PT Evaluation Precautions/Restrictions Precautions Precautions: Fall Precaution Comments: L hemipareisis, L inattention, mild pushing to L, L field of vision cut General   Vital SignsTherapy Vitals Temp: 99.7 F (37.6 C) Temp Source: Oral Pulse Rate: 99 Resp: 18 BP: 136/77 Patient Position (if appropriate): Lying Oxygen Therapy SpO2: 100 % O2 Device: Room Air Pain Pain Assessment Pain Scale: 0-10 Pain Score: 0-No pain Home Living/Prior Functioning Home Living Available Help at Discharge: Family;Available 24 hours/day Type of Home: House Home Access: Stairs to enter [employer] of Steps: 3 Entrance Stairs-Rails: Right;Left;Can reach both Home Layout: One level Bathroom Shower/Tub: Multimedia programmer: Standard Bathroom Accessibility: Yes  Lives With: Spouse Prior Function Level of Independence: Independent with basic ADLs;Independent with gait;Independent with homemaking with ambulation;Independent with transfers  Able to Take Stairs?: Yes Driving: Yes Vocation: Retired Comments: driving, retired Vision/Perception  Vision - Leisure centre manager Range of Motion: Impaired-to be further tested in functional context;Restricted on the left Alignment/Gaze Preference: Head turned;Head tilt;Gaze right Tracking/Visual Pursuits: Impaired - to be  further tested in functional context Additional Comments: continue to assess, pt noted to have L side inattention vs. L visual field cut on eval Perception Perception: Impaired Praxis Praxis: Impaired  Cognition Overall Cognitive Status: Impaired/Different from baseline Arousal/Alertness: Awake/alert Orientation Level: Oriented to person;Oriented to place;Oriented to situation Sustained Attention: Impaired Memory: Impaired Awareness: Impaired Awareness Impairment: Emergent impairment Problem Solving: Impaired Initiating: Impaired Safety/Judgment: Impaired Sensation Sensation Light Touch: Impaired by gross assessment Light Touch Impaired Details: Impaired LUE;Impaired LLE Proprioception: Impaired by gross assessment Coordination Gross Motor Movements are Fluid and Coordinated: No Fine Motor Movements are Fluid and Coordinated: No Heel Shin Test: unable to perform Motor  Motor Motor: Hemiplegia;Abnormal tone;Motor apraxia;Abnormal postural alignment and control   Trunk/Postural Assessment  Cervical Assessment Cervical Assessment: Exceptions to [facility] (forward head, limited rotation to L) Thoracic Assessment Thoracic Assessment: Exceptions to [facility] (rounded shoulders, lateral L lean) Lumbar Assessment Lumbar Assessment: Exceptions to [facility] (posterior pelvic tilt) Postural Control Postural Control: Deficits on evaluation (leans left, pushes left)  Balance Balance Balance Assessed: Yes Static Sitting Balance Static Sitting - Balance Support: Feet supported;Right upper extremity supported Static Sitting - Level of Assistance: 5: Stand by assistance Dynamic Sitting Balance Dynamic Sitting - Balance Support: Right upper extremity supported;Feet supported;During functional activity Dynamic Sitting - Level of Assistance: 4: Min assist Dynamic Sitting - Balance Activities: Lateral lean/weight shifting;Forward lean/weight shifting;Reaching for objects;Reaching across midline;Trunk  control activities Sitting balance - Comments: Intermittent posterior LOB with Min A and cues to correct Static Standing Balance Static Standing - Balance Support: Bilateral upper extremity supported;During functional activity Static Standing - Level of Assistance: 2: Max assist Static Standing - Comment/# of Minutes: R extensor push toward L side with decreased tone, total A to block L knee and shift L pelvis/ hip into extension for upright posture Dynamic Standing Balance Dynamic Standing - Balance Support: Bilateral upper extremity supported;During functional activity Dynamic Standing - Level of Assistance: 1: +1 Total assist Dynamic Standing - Balance Activities: Lateral lean/weight shifting;Reaching across midline;Reaching for objects Dynamic Standing - Comments: Requires vc/ tc for sequencing of weight shift and step during SPVT transfer Extremity Assessment      RLE Assessment RLE Assessment: Within Functional Limits General Strength Comments: grossly 4 to 4+/ 5 proximal to distal LLE Assessment LLE Assessment: Exceptions to [facility] LLE Strength LLE Overall Strength: Deficits Left Hip Flexion: 3-/5 Left Hip Extension: 1/5 Left Hip ABduction: 2-/5 Left Hip ADduction: 2/5 Left Knee  Flexion: 0/5 Left Knee Extension: 2-/5 Left Ankle Dorsiflexion: 0/5 Left Ankle Plantar Flexion: 0/5  Care Tool Care Tool Bed Mobility Roll left and right activity   Roll left and right assist level: Maximal Assistance - Patient 25 - 49%    Sit to lying activity   Sit to lying assist level: Maximal Assistance - Patient 25 - 49%    Lying to sitting edge of bed activity   Lying to sitting edge of bed assist level: Maximal Assistance - Patient 25 - 49%     Care Tool Transfers Sit to stand transfer   Sit to stand assist level: Maximal Assistance - Patient 25 - 49%    Chair/bed transfer   Chair/bed transfer assist level: Total Assistance - Patient < 25%     Toilet transfer Toilet transfer  activity did not occur: Safety/medical concerns Assist Level: 2 Production assistant, radio transfer activity did not occur: Safety/medical concerns        Care Tool Locomotion Ambulation Ambulation activity did not occur: Refused        Walk 10 feet activity Walk 10 feet activity did not occur: Refused       Walk 50 feet with 2 turns activity Walk 50 feet with 2 turns activity did not occur: Safety/medical concerns      Walk 150 feet activity Walk 150 feet activity did not occur: Safety/medical concerns      Walk 10 feet on uneven surfaces activity Walk 10 feet on uneven surfaces activity did not occur: Safety/medical concerns      Stairs Stair activity did not occur: Safety/medical concerns        Walk up/down 1 step activity Walk up/down 1 step or curb (drop down) activity did not occur: Safety/medical concerns     Walk up/down 4 steps activity did not occuR: Safety/medical concerns  Walk up/down 4 steps activity      Walk up/down 12 steps activity Walk up/down 12 steps activity did not occur: Safety/medical concerns      Pick up small objects from floor Pick up small object from the floor (from standing position) activity did not occur: Safety/medical concerns      Wheelchair Will patient use wheelchair at discharge?: Yes Type of Wheelchair: Manual Wheelchair activity did not occur: Safety/medical concerns Wheelchair assist level: Dependent - Patient 0% Max wheelchair distance: 20 ft  Wheel 50 feet with 2 turns activity Wheelchair 50 feet with 2 turns activity did not occur: Safety/medical concerns Assist Level: Dependent - Patient 0%  Wheel 150 feet activity Wheelchair 150 feet activity did not occur: Safety/medical concerns Assist Level: Dependent - Patient 0%    Refer to Care Plan for Long Term Goals  SHORT TERM GOAL WEEK 1 PT Short Term Goal 1 (Week 1): Pt will improve bed mobility to Mod A overall. PT Short Term Goal 2 (Week 1): Pt will perform STS  transfers with Mod A to RW. PT Short Term Goal 3 (Week 1): Pt will perform SPVT transfers with Mod/ Max A using RW and decreased cues for sequencing. PT Short Term Goal 4 (Week 1): Pt will initiate gait training. PT Short Term Goal 5 (Week 1): Pt will initiate w/c mobility training.  Recommendations for other services: Therapeutic Recreation  Stress management and Outing/community reintegration  Skilled Therapeutic Intervention Mobility Bed Mobility Bed Mobility: Rolling Right;Rolling Left;Left Sidelying to Sit;Supine to Sit;Sit to Supine;Sit to Sidelying Left Rolling Right: Maximal Assistance - Patient 25-49% Rolling Left: Maximal Assistance -  Patient 25-49% Left Sidelying to Sit: Maximal Assistance - Patient 25-49%;Total Assistance - Patient < 25% Supine to Sit: Total Assistance - Patient < 25% Sit to Sidelying Left: Maximal Assistance - Patient 25-49% Transfers Transfers: Sit to Stand;Stand Pivot Transfers;Stand to Sit Sit to Stand: Maximal Assistance - Patient 25-49%;Moderate Assistance - Patient 50-74% Stand to Sit: Moderate Assistance - Patient 50-74% Stand Pivot Transfers: Maximal Assistance - Patient 25 - 49% Stand Pivot Transfer Details: Tactile cues for placement;Tactile cues for weight shifting;Tactile cues for initiation;Verbal cues for technique;Verbal cues for sequencing;Verbal cues for precautions/safety;Tactile cues for weight beaing Transfer (Assistive device): 1 person hand held assist Locomotion  Gait Ambulation: No Gait Gait: No Stairs / Additional Locomotion Stairs: No Wheelchair Mobility Wheelchair Mobility: No  Session Notes: PT Evaluation completed; see above for results. PT educated patient in roles of PT vs OT, PT POC, rehab potential, rehab goals, and discharge recommendations along with recommendation for follow-up rehabilitation services. Individual treatment initiated:  Patient supine in bed upon PT arrival. Friend present. Friend updating pt's  daughter in [place] and husband on therapy visits and pt's performance this day. Patient extremely fatigued but alert and agreeable to PT session. No pain complaint during session. Pt's gaze preference is to the R side of the bed and with R cervical flexion and rotation. With cueing and visual target, pt's eye AROM to L is limited bilaterally with L more limited than R.    Therapeutic Activity: Bed Mobility: Patient performed supine to/from sit with near total A overall. Total A to bring LLE to EOB, Mod A for RLE, max A for roll to L side and total A to push UB to upright seated position. Provided verbal cues for sequencing and technique. At end of session, pt able to bring UB down to bed surface at angle across bed, requiring Max A +2 to return to supine with good positioning. Max A +2 to position toward HOB.  Transfers: Patient performed sit to/from stand EOB to RW with Mod/ Max A for power up, block to L knee and hip into extension. Provided vc/ tc for technique. SPVT transfers performed toward R side requiring Max A, significant L knee block to prevent buckling, and max multimodal cueing for weight shifting, sequencing, technique in order to pivot step and turn foot to correct direction, shifting hips toward target seat.  STS performed using STEDY in order to assess for staff use. Pt requires Max A using bari STEDY from w/c for power up  with good pull from RUE. Support required to L side. Pt will be transfer by  Jeralene Huff +2 with staff for safety.   Pt extremely fatigued and refusing ambulation this session.   Neuromuscular Re-ed: NMR facilitated during session with focus on sitting and standing balance. Sitting EOB, pt guided in reaching task anteriorly and laterally as well as across midline in order to assess and challenge trunk control and balance. Bil feet supported on floor and pt requiring CGA to complete reaching task using RUE. Unable to fully challenge past BOS. Intermittent melt toward L  side or posteriorly requiring vc and Min A to reorient to midline. Pt guided in weight shifting in standing for pregait training and for increased joint approximation to improve proprioception. L knee block to prevent buckle. VC/tc for knee extension with intermittent, very brief trace to 2-/ 5 muscle contraction noted in quad with no activation felt in glutes.  NMR performed for improvements in motor control and coordination, balance, sequencing, judgement, and  self confidence/ efficacy in performing all aspects of mobility at highest level of independence.   Patient supine in bed at end of session with brakes locked, bed alarm set, and all needs within reach. Friend with questions re: pt's potential for progress. Unsure of friend's access to HIPAA information and so friend provided with general information re: stroke and individual patients' potentials along with potentials for all different areas of stroke in the brain all playing factors. General information provided re: care provided in inpatient rehab and processes for weekly conferences with therapy and medical team with updates to family provided by SW following meetings. Friend appreciative for information and passing along to dtr and husband via text.    Discharge Criteria: Patient will be discharged from PT if patient refuses treatment 3 consecutive times without medical reason, if treatment goals not met, if there is a change in medical status, if patient makes no progress towards goals or if patient is discharged from hospital.  The above assessment, treatment plan, treatment alternatives and goals were discussed and mutually agreed upon: by patient  Alger Simons 02/28/2021, 10:56 PM

## 2021-02-28 NOTE — IPOC Note (Addendum)
Overall Plan of Care [facility]) Patient Details Name: Tonya [name] MRN: [id number] DOB: 15-Jul-1946  Admitting Diagnosis: Intraparenchymal hematoma of brain [facility])  Hospital Problems: Principal Problem:   Intraparenchymal hematoma of brain [facility])     Functional Problem List: Nursing Bladder, Medication Management, Safety, Bowel, Pain, Endurance  PT Balance, Edema, Endurance, Motor, Perception, Safety, Sensory  OT Balance, Cognition, Pain, Perception, Safety, Sensory, Endurance  SLP Cognition, Perception, Safety  TR         Basic ADL's: OT Eating, Grooming, Bathing, Dressing, Toileting     Advanced  ADL's: OT       Transfers: PT Bed Mobility, Bed to Chair, Car, Chief Operating Officer: PT Ambulation, Emergency planning/management officer, Stairs     Additional Impairments: OT Fuctional Use of Upper Extremity  SLP Swallowing, Social Cognition   Social Interaction, Problem Solving, Memory, Attention, Awareness  TR      Anticipated Outcomes Item Anticipated Outcome  Self Feeding independent  Swallowing  mod I   Basic self-care  supervision  Toileting  supervision   Bathroom Transfers CGA  Bowel/Bladder  manage bowel with mod I and bladder without assist  Transfers  Overall supervision  Locomotion  CGA/ Min A with LRAD  Communication  supervision  Cognition  minA  Pain  pain at or below level 4  Safety/Judgment  maintain safety with cues/reminders   Therapy Plan: PT Intensity: Minimum of 1-2 x/day ,45 to 90 minutes PT Frequency: 5 out of 7 days PT Duration Estimated Length of Stay: 21-24 days OT Intensity: Minimum of 1-2 x/day, 45 to 90 minutes OT Frequency: 5 out of 7 days OT Duration/Estimated Length of Stay: 2-3 weeks SLP Intensity: Minumum of 1-2 x/day, 30 to 90 minutes SLP Frequency: 3 to 5 out of 7 days SLP Duration/Estimated Length of Stay: 2-3 weeks   Due to the current state of emergency, patients may not be receiving their 3-hours of  Medicare-mandated therapy.   Team Interventions: Nursing Interventions Bladder Management, Disease Management/Prevention, Medication Management, Discharge Planning, Pain Management, Bowel Management, Patient/Family Education  PT interventions Ambulation/gait training, Training and development officer, Cognitive remediation/compensation, Community reintegration, Discharge planning, Disease management/prevention, DME/adaptive equipment instruction, Functional electrical stimulation, Functional mobility training, Neuromuscular re-education, Pain management, Patient/family education, Psychosocial support, Splinting/orthotics, Stair training, Therapeutic Activities, Therapeutic Exercise, UE/LE Strength taining/ROM, UE/LE Coordination activities, Visual/perceptual remediation/compensation, Wheelchair propulsion/positioning  OT Interventions Balance/vestibular training, Cognitive remediation/compensation, Discharge planning, DME/adaptive equipment instruction, Functional mobility training, Functional electrical stimulation, Neuromuscular re-education, Pain management, Therapeutic Activities, UE/LE Strength taining/ROM, Visual/perceptual remediation/compensation, Wheelchair propulsion/positioning, UE/LE Coordination activities, Therapeutic Exercise, Self Care/advanced ADL retraining, Splinting/orthotics, Patient/family education  SLP Interventions Cognitive remediation/compensation, Dysphagia/aspiration precaution training, Internal/external aids, Medication managment, Environmental controls, Cueing hierarchy, Patient/family education, Functional tasks  TR Interventions    SW/CM Interventions Discharge Planning, Psychosocial Support, Patient/Family Education, Disease Management/Prevention   Barriers to Discharge MD  Medical stability  Nursing Decreased caregiver support 1 level home with spouse  PT Decreased caregiver support, Incontinence, Lack of/limited family support, Insurance for SNF coverage, Medication  compliance, Behavior reduced cognition and limited strength/ motor control to L increases burden on caregivers  OT Inaccessible home environment, Home environment access/layout, Decreased caregiver support new cognitive deficits, currently requires max A for ADLs  SLP Decreased caregiver support she will require 24 hour supervision and assistance upon discharge  SW       Team Discharge Planning: Destination: PT-Home ,OT- Home , SLP-Home Projected Follow-up: PT-Home health PT, 24 hour supervision/assistance, OT-  Home health OT,  SLP-Home Health SLP, Outpatient SLP, 24 hour supervision/assistance Projected Equipment Needs: PT-To be determined, OT- 3 in 1 bedside comode, To be determined, SLP-None recommended by SLP Equipment Details: PT- , OT-  Patient/family involved in discharge planning: PT- Patient,  OT-Patient, SLP-Patient unable/family or caregive not available  MD ELOS: 16-20d Medical Rehab Prognosis:  Good Assessment: 75 year old female with history of Lung cancer s/p RUL lobectomy with ongoing chemo and bradycardia in the past who was evaluated at OSH on 02/22/21 after presenting with reports of HA followed by confusion. Per teleneurology report patient progressed to "stuporous state, was non verbal with right gaze preference and had left>right sided weakness" CT head showed large intraparenchymal hemorrhage within right parietal lobe as well as mild right frontal subarachnoid hemorrhage and patient had tremulousness with concerns of seizure activity.  UDS positive for benzos and THC.  She was intubated for airway protection and transferred to [facility] for management.  CT head showed IPH in high parietal lobe with subarachnoid extension over right convexity and basal cisterns.  Dr. Venetia Constable consulted for input and recommended follow-up MRI/MRA to rule out underlying mass or vascular abnormality.   MRI/MRA brain done showing stable appearance of right parietal hemorrhage  without underlying mass or vascular malformation, subdural and subarachnoid hemorrhage and small amount of blood within the ventricles without evidence of stenosis, aneurysm or occlusion.  MRV of the head was negative.  She was noted to have right upper extremity tremors on 06/19 and was started on Keppra.  EEG done showing cortical dysfunction right hemisphere secondary to underlying stroke and severe diffuse encephalopathy felt to be nonspecific but likely related to sedation.  She tolerated extubation without difficulty on 06/20 and MBS done showing mild esophageal dysphagia with intact airway protection therefore started on regular diet. She has had issues with tachycardia with heart rates up to 140s and beta-blocker added for rate control.  2D echo showed EF 60 to 65% with moderate LVH and grade 1 DD.  Blood pressures noted to be elevated and medications added for better control. Dr. Leonie Man felt that bleed was likely hypertensive in nature but to repeat MRI brain in 2 to 3 months due to unclear etiology of bleed. Her lethargy has resolved and patient with resultant left-sided weakness with right gaze preference, balance deficits with left inattention and strong left lean as well as high-level cognitive deficits.  Therapy ongoing and CIR was recommended due to functional decline. Current alert and oriented x3 but confused as per daughter and husband.     Now requiring 24/7 Rehab RN,MD, as well as CIR level PT, OT and SLP.  Treatment team will focus on ADLs and mobility with goals set at Supervision   See Team Conference Notes for weekly updates to the plan of care

## 2021-02-28 NOTE — Evaluation (Signed)
Speech Language Pathology Assessment and Plan  Patient Details  Name: Tonya [name] MRN: [id number] Date of Birth: 01/15/1946  SLP Diagnosis: Cognitive Impairments;Dysphagia  Rehab Potential: Good ELOS: 2-3 weeks    Today's Date: 02/28/2021 SLP Individual Time: 1761-6073 SLP Individual Time Calculation (min): 96 min   Hospital Problem: Principal Problem:   Intraparenchymal hematoma of brain ([place])  Past Medical History:  Past Medical History:  Diagnosis Date   Adenoma of left adrenal gland    Cancer ([place])    Lung   Colon polyps    hyperplastic   Complication of anesthesia    Very emotional and cries after anesthesia   GERD (gastroesophageal reflux disease)    Kidney stone    Liver lesion    Microhematuria    Past Surgical History:  Past Surgical History:  Procedure Laterality Date   ABDOMINAL HYSTERECTOMY     APPENDECTOMY     BRONCHIAL BIOPSY  07/18/2020   Procedure: BRONCHIAL BIOPSIES;  Surgeon: [name], DO;  Location: [place] [facility];  Service: Pulmonary;;   BRONCHIAL BRUSHINGS  07/18/2020   Procedure: BRONCHIAL BRUSHINGS;  Surgeon: [name], DO;  Location: [place];  Service: Pulmonary;;   BRONCHIAL NEEDLE ASPIRATION BIOPSY  07/18/2020   Procedure: BRONCHIAL NEEDLE ASPIRATION BIOPSIES;  Surgeon: [name], DO;  Location: [place];  Service: Pulmonary;;   BRONCHIAL WASHINGS  07/18/2020   Procedure: BRONCHIAL WASHINGS;  Surgeon: [name], DO;  Location: [place] [facility];  Service: Pulmonary;;   FIDUCIAL MARKER PLACEMENT  07/18/2020   Procedure: FIDUCIAL MARKER PLACEMENT;  Surgeon: [name], DO;  Location: [place] [facility];  Service: Pulmonary;;   INTERCOSTAL NERVE BLOCK Right 08/26/2020   Procedure: INTERCOSTAL NERVE BLOCK;  Surgeon: [name], MD;  Location: [place];  Service: Thoracic;  Laterality: Right;   IR THORACENTESIS ASP PLEURAL SPACE W/IMG GUIDE  10/10/2020   NODE DISSECTION Right 08/26/2020    Procedure: NODE DISSECTION;  Surgeon: [name], MD;  Location: [place];  Service: Thoracic;  Laterality: Right;   THORACENTESIS Right 11/04/2020   Procedure: [facility];  Surgeon: [name], DO;  Location: [place];  Service: Pulmonary;  Laterality: Right;   THORACENTESIS Right 11/29/2020   Procedure: THORACENTESIS;  Surgeon: [name], DO;  Location: [facility] END[facility];  Service: Pulmonary;  Laterality: Right;   VIDEO BRONCHOSCOPY WITH ENDOBRONCHIAL NAVIGATION N/A 07/18/2020   Procedure: VIDEO BRONCHOSCOPY WITH ENDOBRONCHIAL NAVIGATION;  Surgeon: [name], DO;  Location: [place];  Service: Pulmonary;  Laterality: N/A;   VIDEO BRONCHOSCOPY WITH ENDOBRONCHIAL ULTRASOUND  07/18/2020   Procedure: VIDEO BRONCHOSCOPY WITH ENDOBRONCHIAL ULTRASOUND;  Surgeon: [name], DO;  Location: [place] [facility];  Service: Pulmonary;;    Assessment / Plan / Recommendation  Tonya [name] is a 75 year old female with history of Lung cancer s/p RUL lobectomy with ongoing chemo and bradycardia in the past who was evaluated at OSH on 02/22/21 after presenting with reports of HA followed by confusion. Per teleneurology report patient progressed to "stuporous state, was non verbal with right gaze preference and had left>right sided weakness" CT head showed large intraparenchymal hemorrhage within right parietal lobe as well as mild right frontal subarachnoid hemorrhage and patient had tremulousness with concerns of seizure activity.  UDS positive for benzos and THC.  She was intubated for airway protection and transferred to [facility] for management.  CT head showed IPH in high parietal lobe with subarachnoid extension over right convexity and basal cisterns.  Dr. Venetia Constable consulted for input and recommended follow-up MRI/MRA to rule out underlying mass or vascular abnormality.   MRI/MRA brain done showing stable appearance of right parietal hemorrhage without  underlying mass or vascular malformation, subdural and subarachnoid hemorrhage and small amount of blood within the ventricles without evidence of stenosis, aneurysm or occlusion.  MRV of the head was negative.  She was noted to have right upper extremity tremors on 06/19 and was started on Keppra.  EEG done showing cortical dysfunction right hemisphere secondary to underlying stroke and severe diffuse encephalopathy felt to be nonspecific but likely related to sedation.  She tolerated extubation without difficulty on 06/20 and MBS done showing mild esophageal dysphagia with intact airway protection therefore started on regular diet. She has had issues with tachycardia with heart rates up to 140s and beta-blocker added for rate control.  2D echo showed EF 60 to 65% with moderate LVH and grade 1 DD.  Blood pressures noted to be elevated and medications added for better control. Dr. Leonie Man felt that bleed was likely hypertensive in nature but to repeat MRI brain in 2 to 3 months due to unclear etiology of bleed. Her lethargy has resolved and patient with resultant left-sided weakness with right gaze preference, balance deficits with left inattention and strong left lean as well as high-level cognitive deficits.  Therapy ongoing and CIR was recommended due to functional decline. Current alert and oriented x3 but confused as per daughter and husband.  Clinical Impression Patient presents with mod-severe cognitive impairment, which is also impacting her pragmatic/social language function as well. Patient oriented x4 but continues with confusion, hallucinations and very poor awareness to defcits and their impact. Although she states she is in [facility] hospital, she stated as "technically I'm at [facility]" but also reporting she is at home, speaking of a dresser of hers that was moved into the hallway. Patient also reported having had visions of people in room when no one actually in room. She verbalized recall of therapists  on acute care part of hospital who had told her about left sided visual neglect, recalled acute care MD ("Dr. Lonny Prude") and recalled that she was hospitalized from "a brain blood hemorrhage." Patient did not demonsrate recall of staff members who had entered room for brief assistance and seemed fixated on talking about "Shirlee Limerick and Truman Hayward" who she said were with "speech and therapy". Patient does not demonstate adequate awareness to deficit impact on her funcitoning, questioning why she couldnt get up to get a shower and although she verbalizes awareness to hallucinating, continues to speak of a dresser from her home that was in her hospital room and had been "moved into the hallway". Patient started to cycle back to statements she had made earlier in session and although in the moment she could demonstrate understanding, she is unable to maintain this for any length of time. Patient exhibits abnormal affect, poor initiation of communication and poor topic maintenance. Patient is significantly cognitively impaired and will require SLP intervention to improve her awareness, recall, memory, problem solving, reasoning in order to accurately and safely perform ADL's.  Skilled Therapeutic Interventions          Speech-Language Evaluation, cognitive evaluation  SLP Assessment  Patient will need skilled [place] [facility] during CIR admission    Recommendations  Patient destination: Home Follow up Recommendations: Home Health SLP;Outpatient SLP;24 hour supervision/assistance Equipment Recommended: None recommended by SLP    SLP Frequency 3 to 5 out of 7 days  SLP Duration  SLP Intensity  SLP Treatment/Interventions 2-3 weeks  Minumum of 1-2 x/day, 30 to 90 minutes  Cognitive remediation/compensation;Dysphagia/aspiration precaution training;Internal/external aids;Medication managment;Environmental controls;Cueing hierarchy;Patient/family education;Functional tasks    Pain Pain  Assessment Pain Scale: Faces Faces Pain Scale: No hurt  Prior Functioning Cognitive/Linguistic Baseline: Within functional limits Type of Home: House  Lives With: Spouse Available Help at Discharge: Family;Available 24 hours/day Education: patient reported 2 years college Vocation: Retired (worked in Scientist, research (life sciences) estate)  SLP Evaluation Cognition Overall Cognitive Status: Impaired/Different from baseline Arousal/Alertness: Awake/alert Orientation Level: Oriented X4 Attention: Sustained Sustained Attention: Impaired Sustained Attention Impairment: Verbal basic;Functional basic Memory: Impaired Memory Impairment: Decreased recall of new information Awareness: Impaired Awareness Impairment: Emergent impairment Problem Solving: Impaired Problem Solving Impairment: Verbal basic;Functional basic Executive Function: Initiating Initiating: Impaired Initiating Impairment: Verbal basic;Functional basic Safety/Judgment: Impaired  Comprehension Auditory Comprehension Overall Auditory Comprehension: Appears within functional limits for tasks assessed Expression Expression Primary Mode of Expression: Verbal Verbal Expression Overall Verbal Expression: Impaired Initiation: No impairment Repetition: No impairment Naming: No impairment Pragmatics: Impairment Impairments: Abnormal affect;Eye contact;Turn Taking Interfering Components: Attention Effective Techniques: Open ended questions;Semantic cues Non-Verbal Means of Communication: Not applicable Written Expression Dominant Hand: Right Written Expression: Not tested Oral Motor Oral Motor/Sensory Function Overall Oral Motor/Sensory Function: Within functional limits Motor Speech Overall Motor Speech: Appears within functional limits for tasks assessed Intelligibility: Intelligible Motor Planning: Witnin functional limits  Care Tool Care Tool Cognition Expression of Ideas and Wants Expression of Ideas and Wants: Some difficulty -  exhibits some difficulty with expressing needs and ideas (e.g, some words or finishing thoughts) or speech is not clear   Understanding Verbal and Non-Verbal Content Understanding Verbal and Non-Verbal Content: Usually understands - understands most conversations, but misses some part/intent of message. Requires cues at times to understand   Memory/Recall Ability *first 3 days only Memory/Recall Ability *first 3 days only: Current season;That he or she is in a hospital/hospital unit    Intelligibility: Intelligible  Short Term Goals: Week 1: SLP Short Term Goal 1 (Week 1): Patient will participate in BSE (bedside swallow evaluation) to ensure PO toleration. SLP Short Term Goal 2 (Week 1): Patient will recall and describe specific therapeutic interventions with PT/OT/SLP with modA cues. SLP Short Term Goal 3 (Week 1): Patient will demonstrate sustained attention to basic level problem solving tasks with modA cues. SLP Short Term Goal 4 (Week 1): Patient will demonstrate adequate reasoning and problem solving when completed simulated functional tasks with modA cues. SLP Short Term Goal 5 (Week 1): Patient will demonstrate consistent emergent awareness to deficits and limitations, with modA -cues. SLP Short Term Goal 6 (Week 1): Patient will perform basic-mild complex organizing and/or sequencing tasks with modA cues.  Refer to Care Plan for Long Term Goals  Recommendations for other services: Neuropsych  Discharge Criteria: Patient will be discharged from SLP if patient refuses treatment 3 consecutive times without medical reason, if treatment goals not met, if there is a change in medical status, if patient makes no progress towards goals or if patient is discharged from hospital.  The above assessment, treatment plan, treatment alternatives and goals were discussed and mutually agreed upon: No family available/patient unable  Sonia Baller, MA, CCC-SLP Speech Therapy

## 2021-03-01 MED ORDER — MAGNESIUM SULFATE 2 GM/50ML IV SOLN
2.0000 g | Freq: Once | INTRAVENOUS | Status: AC
  Administered 2021-03-01: 2 g via INTRAVENOUS
  Filled 2021-03-01: qty 50

## 2021-03-01 MED ORDER — SUMATRIPTAN SUCCINATE 50 MG PO TABS
50.0000 mg | ORAL_TABLET | ORAL | Status: DC | PRN
  Administered 2021-03-01 – 2021-03-03 (×4): 50 mg via ORAL
  Filled 2021-03-01 (×6): qty 1

## 2021-03-01 NOTE — Progress Notes (Signed)
Speech Language Pathology Daily Session Note  Patient Details  Name: Tonya [name] MRN: [id number] Date of Birth: March 04, 1946  Today's Date: 03/01/2021 SLP Individual Time: 1120-1205 SLP Individual Time Calculation (min): 45 min  Short Term Goals: Week 1: SLP Short Term Goal 1 (Week 1): Patient will participate in BSE (bedside swallow evaluation) to ensure PO toleration. SLP Short Term Goal 2 (Week 1): Patient will recall and describe specific therapeutic interventions with PT/OT/SLP with modA cues. SLP Short Term Goal 3 (Week 1): Patient will demonstrate sustained attention to basic level problem solving tasks with modA cues. SLP Short Term Goal 4 (Week 1): Patient will demonstrate adequate reasoning and problem solving when completed simulated functional tasks with modA cues. SLP Short Term Goal 5 (Week 1): Patient will demonstrate consistent emergent awareness to deficits and limitations, with modA -cues. SLP Short Term Goal 6 (Week 1): Patient will perform basic-mild complex organizing and/or sequencing tasks with modA cues.  Skilled Therapeutic Interventions:Skilled ST services focused on BSE and cognitive skills. Pt's daughter present and noted acute changes in confusion and reduced PO intake, but pt supports due to dislike of hospital foods. SLP administered BSE, pt demonstrated oral motor skills WFL, and consumption of thin liquid via straw/cup in which pt demonstrated oral control, swallow appeared timely with no overt s/s aspiration. Pt required extra time, appeared due to inattention when masticating dys 3 texture trial with ability to clear with liquid wash and no overt s/s aspiration. SLP recommends continuing regular textures and thin liquids due to poor intake, provided education pertaining to ordering meals and bring in food to increase PO intake.  SLP facilitated sustained attention, basic problem solving, error awareness and left attention in 3 step ADLs picture card  sequence tasks, pt required max fade to mod A verbal cues. Pt required max A verbal cues to scan left of midline during task and limited physical initiation in task. Pt was left in room with daughter, call bell within reach and chair alarm set. SLP recommends to continue skilled services.     Pain Pain Assessment Pain Scale: 0-10 Pain Score: 0-No pain  Therapy/Group: Individual Therapy  [place] Bra[place]ord  [facility] 03/01/2021, 12:17 PM

## 2021-03-01 NOTE — Progress Notes (Signed)
PROGRESS NOTE   Subjective/Complaints: C/o migraine, says she has had it for 4 days. Magnesium low- will supplement IV today and add sumatriptan Working in [place] with [employer] [place] Daughter asks about length of stay  ROS- neg CP, SOB, N/V, +migraine Objective:   No results found. Recent Labs    02/27/21 0339 02/28/21 0454  WBC 9.1 5.4  HGB 9.3* 9.5*  HCT 27.9* 27.7*  PLT 296 309   Recent Labs    02/27/21 0339 02/28/21 0454  NA 136 135  K 3.3* 3.3*  CL 105 104  CO2 17* 19*  GLUCOSE 98 105*  BUN 18 13  CREATININE 0.96 0.78  CALCIUM 9.3 9.2    Intake/Output Summary (Last 24 hours) at 03/01/2021 1419 Last data filed at 02/28/2021 1800 Gross per 24 hour  Intake 0 ml  Output --  Net 0 ml        Physical Exam: Vital Signs Blood pressure 139/70, pulse 72, temperature 98.5 F (36.9 C), temperature source Oral, resp. rate 16, height 5\' 4"  (1.626 m), weight 59.9 kg, SpO2 99 %. Gen: no distress, normal appearing HEENT: oral mucosa pink and moist, NCAT Cardio: Reg rate Chest: normal effort, normal rate of breathing Abd: soft, non-distended Ext: no edema Psych: pleasant, normal affect Skin: intact:  Extremities: No clubbing, cyanosis, or edema Skin: No evidence of breakdown, no evidence of rash Neurologic: Alert and oriented  motor strength is 5/5 in right and 3- Leftdeltoid, bicep, tricep, grip, hip flexor, knee extensors, ankle dorsiflexor and plantar flexor Sensory exam normal sensation to light touch RIght and absent left  upper and lower extremities Cerebellar exam unable to perform on left due to weakness Musculoskeletal: Full range of motion in all 4 extremities. No joint swelling   Assessment/Plan: 1. Functional deficits which require 3+ hours per day of interdisciplinary therapy in a comprehensive inpatient rehab setting. Physiatrist is providing close team supervision and 24 hour management of active  medical problems listed below. Physiatrist and rehab team continue to assess barriers to discharge/monitor patient progress toward functional and medical goals  Care Tool:  Bathing    Body parts bathed by patient: Left arm, Chest, Abdomen, Front perineal area, Right upper leg, Left upper leg, Face   Body parts bathed by helper: Right arm, Buttocks, Right lower leg, Left lower leg     Bathing assist Assist Level: Moderate Assistance - Patient 50 - 74%     Upper Body Dressing/Undressing Upper body dressing   What is the patient wearing?: Pull over shirt    Upper body assist Assist Level: Maximal Assistance - Patient 25 - 49%    Lower Body Dressing/Undressing Lower body dressing      What is the patient wearing?: Incontinence brief     Lower body assist Assist for lower body dressing: 2 Helpers     Toileting Toileting    Toileting assist Assist for toileting: 2 Helpers     Transfers Chair/bed transfer  Transfers assist  Chair/bed transfer activity did not occur: Safety/medical concerns  Chair/bed transfer assist level: Total Assistance - Patient < 25%     Locomotion Ambulation   Ambulation assist   Ambulation activity did not  occur: Refused          Walk 10 feet activity   Assist  Walk 10 feet activity did not occur: Refused        Walk 50 feet activity   Assist Walk 50 feet with 2 turns activity did not occur: Safety/medical concerns         Walk 150 feet activity   Assist Walk 150 feet activity did not occur: Safety/medical concerns         Walk 10 feet on uneven surface  activity   Assist Walk 10 feet on uneven surfaces activity did not occur: Safety/medical concerns         Wheelchair     Assist Will patient use wheelchair at discharge?: Yes Type of Wheelchair: Manual Wheelchair activity did not occur: Safety/medical concerns  Wheelchair assist level: Dependent - Patient 0% Max wheelchair distance: 20 ft     Wheelchair 50 feet with 2 turns activity    Assist    Wheelchair 50 feet with 2 turns activity did not occur: Safety/medical concerns   Assist Level: Dependent - Patient 0%   Wheelchair 150 feet activity     Assist  Wheelchair 150 feet activity did not occur: Safety/medical concerns   Assist Level: Dependent - Patient 0%   Blood pressure 139/70, pulse 72, temperature 98.5 F (36.9 C), temperature source Oral, resp. rate 16, height 5\' 4"  (1.626 m), weight 59.9 kg, SpO2 99 %.    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  Antithrombotics: -DVT/anticoagulation:  Mechanical: Sequential compression devices, below knee Bilateral lower extremities             -antiplatelet therapy: N/A 3. Headaches/Pain Management: Oxycodone prn for HA             --monitor for symptoms--may need Topamax. 4. Anxiety: LCSW to follow for evaluation and support. Recommended to continue to minimize use of Ativan due to negative effects of long term use on cognition.             -antipsychotic agents: N/A 5. Neuropsych: This patient is not capable of making decisions on her own behalf. 6. Skin/Wound Care: Routine pressure relief measures. D/c IV after magnesium supplementation as per patient request 7. Fluids/Electrolytes/Nutrition: Monitor I/O. Check CMET in am. 8. HTN: Monitor BP tid--continue Lisinopril and metoprolol. --Poorly controlled -->SBP goal<160 9. Seizure prophylaxis: As per discharge summary, neurology recommended discontinuing Keppra in case it was contributing to hallucinations, but states that hallucinations were present prior to admission and patient has been tremulous/twitchy raising some concern for seizure. 6/19 to 6/20 showed no evidence of seizures. Consider discontinuing 6/25 one week after ICH if remains seizure free.  10. Intermittent fevers:  Being monitored and have resolved without treatment. 11. Anemia of chronic illness?: Continue to monitor H/H with serial checks--around 9 since 12/2020. 12. Hypomagnesemia: Resume home dose of supplement. IV supplementation on 6/25 13. Hypokalemia: Will add daily supplement.just added KCL  14. Delirium: thought to be secondary to benzodiazepine withdrawal but patient was only taking once per night at home. UA negative. Hypokalemic and hypomagnesemia as above, electrolytes otherwise stable. Aox3. As per discharge summary, hallucinations were present prior to admission as  well. May be CVA related , A and O this am   15.  Freq BM will reduce senna S to 1 po qhs 16.  Vascular HA add topiramate on 6/24- sumatriptan ordered LOS: 2 days A FACE TO FACE EVALUATION WAS PERFORMED  Martha Clan P Luciel Brickman 03/01/2021, 2:19 PM

## 2021-03-01 NOTE — Progress Notes (Signed)
Occupational Therapy Session Note  Patient Details  Name: Tonya [name] MRN: [id number] Date of Birth: [birth year]  Today's Date: 03/01/2021 OT Individual Time: 1410-1510 OT Individual Time Calculation (min): 60 min    Short Term Goals: Week 1:  OT Short Term Goal 1 (Week 1): Pt will complete UB dressing with min A and use of hemi dressing techs prn to increase overall ADL independence. OT Short Term Goal 2 (Week 1): Pt will complete LB dressing with mod A and AE prn. OT Short Term Goal 3 (Week 1): Pt will complete 3/3 toileting tasks on commode with mod A. OT Short Term Goal 4 (Week 1): Pt will complete functional transfers to all appropriate surfaces with LRAD with min A.  Skilled Therapeutic Interventions/Progress Updates:     Pt received in bed with family present and 5 out of 10 pain in Head. Pt reporting migraine since 4 days ago and MD alerted and prescribed medicaiotns. Low stim environment provided for pain relief as well as rest  ADL:  Pt completes UB dressing with MAX A overall and VC for hemi dressing. Pt actively attempting to use LUE for dressing but requires mulitmodal cuing for L attention to finish a step of dressing Pt completes LB dressing with +2 A at sit to stand level. HOH A provided to use LUE to help thread pants and position Les into figure 4 Pt completes footwear with MAX A overall to don socks over toes and don shoes. Pt able ot pull up from toes to heel. OT placed all items on L for L attention throughotu session    Therapeutic activity Pt reaches with LUE and mulitmodal cuing to scan L to locate large pegs an pull out of peg board and place on R in bin with tacile cues at shoulder with MIN A to maintain shoulder flexion to keep arm off table top  Pt left at end of session in bed with exit alarm on, call light in reach and all needs met   Therapy Documentation Precautions:  Precautions Precautions: Fall Precaution Comments: L hemipareisis, L  inattention, mild pushing to L, L field of vision cut Restrictions Weight Bearing Restrictions: No General:   Vital Signs: Therapy Vitals Temp: 98.5 F (36.9 C) Temp Source: Oral Pulse Rate: 78 Resp: 16 BP: 111/72 Patient Position (if appropriate): Lying Oxygen Therapy SpO2: 100 % O2 Device: Room Air Pain: Pain Assessment Pain Score: Asleep ADL: ADL Eating: Set up Where Assessed-Eating: Chair Grooming: Minimal assistance Where Assessed-Grooming: Sitting at sink Upper Body Bathing: Moderate assistance Where Assessed-Upper Body Bathing: Sitting at sink Lower Body Bathing: Maximal assistance Where Assessed-Lower Body Bathing: Sitting at sink Upper Body Dressing: Moderate cueing, Maximal assistance Where Assessed-Upper Body Dressing: Sitting at sink Lower Body Dressing: Maximal assistance Where Assessed-Lower Body Dressing: Sitting at sink, Standing at sink Toileting: Maximal assistance Where Assessed-Toileting: Glass blower/designer: Maximal Print production planner Method: Arts development officer: Energy manager Method: Unable to assess [employer] Transfer: Unable to assess [employer] Method: Unable to assess Vision   Perception    Praxis   Exercises:   Other Treatments:     Therapy/Group: Individual Therapy  Tonny Branch 03/01/2021, 6:59 AM

## 2021-03-01 NOTE — Progress Notes (Signed)
Physical Therapy Session Note  Patient Details  Name: Tonya [name] MRN: [id number] Date of Birth: 1946/02/10  Today's Date: 03/01/2021 PT Individual Time: 1005-1106 and 0973-5329 PT Individual Time Calculation (min): 61 min and  30 min   Short Term Goals: Week 1:  PT Short Term Goal 1 (Week 1): Pt will improve bed mobility to Mod A overall. PT Short Term Goal 2 (Week 1): Pt will perform STS transfers with Mod A to RW. PT Short Term Goal 3 (Week 1): Pt will perform SPVT transfers with Mod/ Max A using RW and decreased cues for sequencing. PT Short Term Goal 4 (Week 1): Pt will initiate gait training. PT Short Term Goal 5 (Week 1): Pt will initiate w/c mobility training.  Skilled Therapeutic Interventions/Progress Updates:    Session 1: Pt received supine in bed and reports she "woke up with a migraine" but with encouragement is agreeable to participate in therapy session. Pt noted to be experiencing some confusion talking about her clothes being "in the other room" but then pt states "I know I'm at [facility] even though I keep thinking I'm at home." Pt oriented to location and situation. Supine>sitting L EOB, HOB partially elevated and using bedrail, max assist for trunk upright and L LE management. Sitting EOB, R UE support, maintains trunk control with CGA while threading pants max assist. Sit>stand from EOB with max assist of 1 for lifting and balance while blocking L knee buckle, pulled pants up over hips total assist with +2 assist for safety. R squat pivot EOB>w/c, max assist for lifting/pivoting hips and blocking L knee to promote WBing.  Transported to/from gym in w/c for time management and energy conservation. L squat pivot w/c>EOM max assist for lifting/pivoting hips and blocking L knee to promote WBing - cuing for head/hips relationship and anterior trunk lean. Sit>stand from EOM with max assist of 1 for lifting, balance, and blocking L knee buckle. Dynamic standing balance of  reaching with RUE to promote R weight shift with increased L hip/knee extension activation due to some pushing with R LE noted causing L lean - pt demos some L glute and quad activation.   Pt becoming fatigued with standing therefore transitioned to sitting EOM trunk control/dynamic sitting balance and L UE NMR task of reaching to grasp horseshoes and reach cross body to place on given target.  L squat pivot EOM>w/c as noted above. Donned L LE ankle DF assist ACE wrap. Gait training 18ft 2x at R hallway rail with max assist of 1 and +2 w/c follow - requires max/total assist to block L knee during stance and max assist to advance during swing phase - cuing for increased upright trunk posture and reciprocal stepping pattern. At end of session pt transported back to room and left sitting in w/c with needs in reach and seat belt alarm on.   Session 2: Pt received supine in bed and reports being tired but with encouragement agreeable to therapy session. Pt continues to report she has had a "migraine headache for 24hrs with no relief." Nurse notified for medication administration. Supine bridging 2x10 reps with manual facilitation for L LE into hooklying position targeting L LE NMR in [place] - cuing for increased L glute activation and increased hip clearance, good activation noted. Supine>sitting L EOB, HOB flat, max assist for trunk upright and L LE management - requires cuing for motor planning and initiation due to L inattention. Repeated sit<>stands to/from EOB x5reps with max assist for lifting  and balance while blocking L knee buckle - demos some pushing with R LE causing L lean, demos activation in L quad to achieve terminal knee extension but requires blocking to prevent buckle - manual facilitation for improved hip alignment to achieve midline. While sitting EOB, pt leans backwards onto R forearm and starts experiencing vertigo with nystagmus noted - pt reports experiencing this earlier today.  Once  symptoms dissipated assisted pt into supine on bed with mod assist for trunk descent and  LLE management onto bed. Discussed plan for vestibular evaluation. Pt left supine in bed with needs in reach and bed alarm on.   Therapy Documentation Precautions:  Precautions Precautions: Fall Precaution Comments: L hemipareisis, L inattention, mild pushing to L, L field of vision cut Restrictions Weight Bearing Restrictions: No  Pain: Session 1: Reports having a "migraine headache" - nurse notified for medication administration.  Session 2: Continues to report "migraine headache" - kept lights low and notified nurse for medication administration.   Therapy/Group: Individual Therapy  Tawana Scale , PT, DPT, CSRS 03/01/2021, 7:56 AM

## 2021-03-01 NOTE — Plan of Care (Signed)
  Problem: Consults Goal: RH STROKE PATIENT EDUCATION Description: See Patient Education module for education specifics  Outcome: Progressing   Problem: RH BOWEL ELIMINATION Goal: RH STG MANAGE BOWEL WITH ASSISTANCE Description: STG Manage Bowel with mod I Assistance. Outcome: Progressing Goal: RH STG MANAGE BOWEL W/MEDICATION W/ASSISTANCE Description: STG Manage Bowel with Medication with mod I Assistance. Outcome: Progressing   Problem: RH BLADDER ELIMINATION Goal: RH STG MANAGE BLADDER WITH ASSISTANCE Description: STG Manage Bladder With out Assistance Outcome: Progressing   Problem: RH SAFETY Goal: RH STG ADHERE TO SAFETY PRECAUTIONS W/ASSISTANCE/DEVICE Description: STG Adhere to Safety Precautions With cues/reminders Assistance/Device. Outcome: Progressing   Problem: RH COGNITION-NURSING Goal: RH STG USES MEMORY AIDS/STRATEGIES W/ASSIST TO PROBLEM SOLVE Description: STG Uses Memory Aids/Strategies With cues/reminders Assistance to Problem Solve. Outcome: Progressing   Problem: RH PAIN MANAGEMENT Goal: RH STG PAIN MANAGED AT OR BELOW PT'S PAIN GOAL Description: At or below level 4 Outcome: Progressing   Problem: RH KNOWLEDGE DEFICIT Goal: RH STG INCREASE KNOWLEDGE OF HYPERTENSION Description: Patient will be able to manage HTN with medications and dietary modifications using handouts and educational tools independently Outcome: Progressing Goal: RH STG INCREASE KNOWLEGDE OF HYPERLIPIDEMIA Description: Patient will be able to manage HLD with medications and dietary modifications using handouts and educational tools independently Outcome: Progressing Goal: RH STG INCREASE KNOWLEDGE OF STROKE PROPHYLAXIS Description: Patient will be able to manage secondary stroke risks with medications and dietary modifications using handouts and educational tools independently Outcome: Progressing

## 2021-03-01 NOTE — Plan of Care (Signed)
Problem: RH Balance Goal: LTG Patient will maintain dynamic sitting balance (PT) Description: LTG:  Patient will maintain dynamic sitting balance with assistance during mobility activities (PT) Flowsheets (Taken 02/28/2021 2010) LTG: Pt will maintain dynamic sitting balance during mobility activities with:: Supervision/Verbal cueing Goal: LTG Patient will maintain dynamic standing balance (PT) Description: LTG:  Patient will maintain dynamic standing balance with assistance during mobility activities (PT) Flowsheets (Taken 02/28/2021 2010) LTG: Pt will maintain dynamic standing balance during mobility activities with:: Contact Guard/Touching assist   Problem: Sit to Stand Goal: LTG:  Patient will perform sit to stand with assistance level (PT) Description: LTG:  Patient will perform sit to stand with assistance level (PT) Flowsheets (Taken 02/28/2021 2010) LTG: PT will perform sit to stand in preparation for functional mobility with assistance level: Supervision/Verbal cueing   Problem: RH Bed Mobility Goal: LTG Patient will perform bed mobility with assist (PT) Description: LTG: Patient will perform bed mobility with assistance, with/without cues (PT). Flowsheets (Taken 02/28/2021 2010) LTG: Pt will perform bed mobility with assistance level of: Supervision/Verbal cueing   Problem: RH Bed to Chair Transfers Goal: LTG Patient will perform bed/chair transfers w/assist (PT) Description: LTG: Patient will perform bed to chair transfers with assistance (PT). Flowsheets (Taken 02/28/2021 2010) LTG: Pt will perform Bed to Chair Transfers with assistance level: Contact Guard/Touching assist   Problem: RH Car Transfers Goal: LTG Patient will perform car transfers with assist (PT) Description: LTG: Patient will perform car transfers with assistance (PT). Flowsheets (Taken 02/28/2021 2010) LTG: Pt will perform car transfers with assist:: Contact Guard/Touching assist   Problem: RH Furniture  Transfers Goal: LTG Patient will perform furniture transfers w/assist (OT/PT) Description: LTG: Patient will perform furniture transfers  with assistance (OT/PT). Flowsheets (Taken 02/28/2021 2010) LTG: Pt will perform furniture transfers with assist:: Contact Guard/Touching assist   Problem: RH Ambulation Goal: LTG Patient will ambulate in controlled environment (PT) Description: LTG: Patient will ambulate in a controlled environment, # of feet with assistance (PT). Flowsheets (Taken 02/28/2021 2010) LTG: Pt will ambulate in controlled environ  assist needed:: Minimal Assistance - Patient > 75% LTG: Ambulation distance in controlled environment: 100 ft using LRAD Goal: LTG Patient will ambulate in home environment (PT) Description: LTG: Patient will ambulate in home environment, # of feet with assistance (PT). Flowsheets (Taken 02/28/2021 2010) LTG: Pt will ambulate in home environ  assist needed:: Contact Guard/Touching assist LTG: Ambulation distance in home environment: <50 ft using LRAD   Problem: RH Wheelchair Mobility Goal: LTG Patient will propel w/c in controlled environment (PT) Description: LTG: Patient will propel wheelchair in controlled environment, # of feet with assist (PT) Flowsheets (Taken 02/28/2021 2010) LTG: Pt will propel w/c in controlled environ  assist needed:: Supervision/Verbal cueing LTG: Propel w/c distance in controlled environment: 125ft using hemitechnique Goal: LTG Patient will propel w/c in home environment (PT) Description: LTG: Patient will propel wheelchair in home environment, # of feet with assistance (PT). Flowsheets (Taken 02/28/2021 2010) LTG: Pt will propel w/c in home environ  assist needed:: Supervision/Verbal cueing Distance: wheelchair distance in controlled environment: 150 LTG: Propel w/c distance in home environment: 60ft   Problem: RH Stairs Goal: LTG Patient will ambulate up and down stairs w/assist (PT) Description: LTG: Patient will  ambulate up and down # of stairs with assistance (PT) Flowsheets (Taken 02/28/2021 2010) LTG: Pt will ambulate up/down stairs assist needed:: Contact Guard/Touching assist LTG: Pt will  ambulate up and down number of stairs: at least 3 using BHR setup  as per home environment

## 2021-03-02 ENCOUNTER — Inpatient Hospital Stay (HOSPITAL_COMMUNITY): Payer: Medicare HMO

## 2021-03-02 MED ORDER — TOPIRAMATE 25 MG PO TABS
50.0000 mg | ORAL_TABLET | Freq: Two times a day (BID) | ORAL | Status: DC
  Administered 2021-03-02 – 2021-03-07 (×10): 50 mg via ORAL
  Filled 2021-03-02 (×11): qty 2

## 2021-03-02 NOTE — Plan of Care (Signed)
  Problem: Consults Goal: RH STROKE PATIENT EDUCATION Description: See Patient Education module for education specifics  Outcome: Progressing   Problem: RH BOWEL ELIMINATION Goal: RH STG MANAGE BOWEL WITH ASSISTANCE Description: STG Manage Bowel with mod I Assistance. Outcome: Progressing Goal: RH STG MANAGE BOWEL W/MEDICATION W/ASSISTANCE Description: STG Manage Bowel with Medication with mod I Assistance. Outcome: Progressing   Problem: RH BLADDER ELIMINATION Goal: RH STG MANAGE BLADDER WITH ASSISTANCE Description: STG Manage Bladder With out Assistance Outcome: Progressing   Problem: RH SAFETY Goal: RH STG ADHERE TO SAFETY PRECAUTIONS W/ASSISTANCE/DEVICE Description: STG Adhere to Safety Precautions With cues/reminders Assistance/Device. Outcome: Progressing   Problem: RH COGNITION-NURSING Goal: RH STG USES MEMORY AIDS/STRATEGIES W/ASSIST TO PROBLEM SOLVE Description: STG Uses Memory Aids/Strategies With cues/reminders Assistance to Problem Solve. Outcome: Progressing   Problem: RH PAIN MANAGEMENT Goal: RH STG PAIN MANAGED AT OR BELOW PT'S PAIN GOAL Description: At or below level 4 Outcome: Progressing   Problem: RH KNOWLEDGE DEFICIT Goal: RH STG INCREASE KNOWLEDGE OF HYPERTENSION Description: Patient will be able to manage HTN with medications and dietary modifications using handouts and educational tools independently Outcome: Progressing Goal: RH STG INCREASE KNOWLEGDE OF HYPERLIPIDEMIA Description: Patient will be able to manage HLD with medications and dietary modifications using handouts and educational tools independently Outcome: Progressing Goal: RH STG INCREASE KNOWLEDGE OF STROKE PROPHYLAXIS Description: Patient will be able to manage secondary stroke risks with medications and dietary modifications using handouts and educational tools independently Outcome: Progressing

## 2021-03-02 NOTE — Progress Notes (Signed)
Physical Therapy Session Note  Patient Details  Name: Tonya [name] MRN: [id number] Date of Birth: 14-May-1946  Today's Date: 03/02/2021 PT Individual Time: 4827-0786 PT Individual Time Calculation (min): 60 min   Short Term Goals: Week 1:  PT Short Term Goal 1 (Week 1): Pt will improve bed mobility to Mod A overall. PT Short Term Goal 2 (Week 1): Pt will perform STS transfers with Mod A to RW. PT Short Term Goal 3 (Week 1): Pt will perform SPVT transfers with Mod/ Max A using RW and decreased cues for sequencing. PT Short Term Goal 4 (Week 1): Pt will initiate gait training. PT Short Term Goal 5 (Week 1): Pt will initiate w/c mobility training.  Skilled Therapeutic Interventions/Progress Updates:    Pt received seated in bed, agreeable to PT session. Pt reports ongoing migraines and R sciatic pain, declines intervention at this time. Pt reports frustration with nursing staff not getting her out of bed earlier this date despite multiple requests. Pt is mod A for supine to sit with HOB elevated, assist needed for LLE management and trunk elevation. Squat pivot transfer to the R to w/c with max A and L knee blocked. Pt requesting to go outdoors this session. Pt take outside for improved mood. Pt also with multiple questions regarding exercise she can be doing in her room when not in therapy. Provided patient with level blue foam for L hand strengthening. Attempted to have pt perform seated LLE therex, trace muscle activation noted. Education with patient regarding current LOF and AAROM therex she could perform at bed level with assist from family. Also educated patient regarding purpose of therapy for standing and weight-bearing for L UE and LE recovery. Remainder of session focused on standing in therapy gym with use of mirror for visual feedback. Pt requires max to total A to stand with no AD, L knee blocked. Pt with heavy reliance on keeping R knee in locked position to maintain standing  balance. Attempt to have pt unlock R knee, unable to maintain midline with heavy weight shift to the L. Pt requests to return to bed at end of session due to fatigue. Squat pivot transfer back to bed with max A to the L. Sit to supine mod A needed for LLE management and some trunk control. Pt left seated in bed with needs in reach, bed alarm in place, daughter in law present.  Therapy Documentation Precautions:  Precautions Precautions: Fall Precaution Comments: L hemipareisis, L inattention, mild pushing to L, L field of vision cut Restrictions Weight Bearing Restrictions: No    Therapy/Group: Individual Therapy  Excell Seltzer, PT, DPT, CSRS  03/02/2021, 5:16 PM

## 2021-03-03 LAB — BASIC METABOLIC PANEL
Anion gap: 8 (ref 5–15)
BUN: 20 mg/dL (ref 8–23)
CO2: 23 mmol/L (ref 22–32)
Calcium: 9.8 mg/dL (ref 8.9–10.3)
Chloride: 106 mmol/L (ref 98–111)
Creatinine, Ser: 0.83 mg/dL (ref 0.44–1.00)
GFR, Estimated: >60 mL/min (ref >60)
Glucose, Bld: 103 mg/dL — ABNORMAL HIGH (ref 70–99)
Potassium: 4.5 mmol/L (ref 3.5–5.1)
Sodium: 137 mmol/L (ref 135–145)

## 2021-03-03 LAB — CBC
HCT: 32 % — ABNORMAL LOW (ref 36.0–46.0)
Hemoglobin: 10.6 g/dL — ABNORMAL LOW (ref 12.0–15.0)
MCH: 33 pg (ref 26.0–34.0)
MCHC: 33.1 g/dL (ref 30.0–36.0)
MCV: 99.7 fL (ref 80.0–100.0)
Platelets: 388 10*3/uL (ref 150–400)
RBC: 3.21 MIL/uL — ABNORMAL LOW (ref 3.87–5.11)
RDW: 13.8 % (ref 11.5–15.5)
WBC: 5.4 10*3/uL (ref 4.0–10.5)
nRBC: 0 % (ref 0.0–0.2)

## 2021-03-03 NOTE — Plan of Care (Signed)
  Problem: Consults Goal: RH STROKE PATIENT EDUCATION Description: See Patient Education module for education specifics  Outcome: Progressing   Problem: RH BOWEL ELIMINATION Goal: RH STG MANAGE BOWEL WITH ASSISTANCE Description: STG Manage Bowel with mod I Assistance. Outcome: Progressing Goal: RH STG MANAGE BOWEL W/MEDICATION W/ASSISTANCE Description: STG Manage Bowel with Medication with mod I Assistance. Outcome: Progressing   Problem: RH BLADDER ELIMINATION Goal: RH STG MANAGE BLADDER WITH ASSISTANCE Description: STG Manage Bladder With out Assistance Outcome: Progressing   Problem: RH SAFETY Goal: RH STG ADHERE TO SAFETY PRECAUTIONS W/ASSISTANCE/DEVICE Description: STG Adhere to Safety Precautions With cues/reminders Assistance/Device. Outcome: Progressing   Problem: RH COGNITION-NURSING Goal: RH STG USES MEMORY AIDS/STRATEGIES W/ASSIST TO PROBLEM SOLVE Description: STG Uses Memory Aids/Strategies With cues/reminders Assistance to Problem Solve. Outcome: Progressing   Problem: RH PAIN MANAGEMENT Goal: RH STG PAIN MANAGED AT OR BELOW PT'S PAIN GOAL Description: At or below level 4 Outcome: Progressing   Problem: RH KNOWLEDGE DEFICIT Goal: RH STG INCREASE KNOWLEDGE OF HYPERTENSION Description: Patient will be able to manage HTN with medications and dietary modifications using handouts and educational tools independently Outcome: Progressing Goal: RH STG INCREASE KNOWLEGDE OF HYPERLIPIDEMIA Description: Patient will be able to manage HLD with medications and dietary modifications using handouts and educational tools independently Outcome: Progressing Goal: RH STG INCREASE KNOWLEDGE OF STROKE PROPHYLAXIS Description: Patient will be able to manage secondary stroke risks with medications and dietary modifications using handouts and educational tools independently Outcome: Progressing

## 2021-03-03 NOTE — Progress Notes (Signed)
Occupational Therapy Session Note  Patient Details  Name: Tonya [name] MRN: [id number] Date of Birth: July 30, 1946  Today's Date: 03/03/2021 OT Individual Time: 1000-1100 OT Individual Time Calculation (min): 60 min    Short Term Goals: Week 1:  OT Short Term Goal 1 (Week 1): Pt will complete UB dressing with min A and use of hemi dressing techs prn to increase overall ADL independence. OT Short Term Goal 2 (Week 1): Pt will complete LB dressing with mod A and AE prn. OT Short Term Goal 3 (Week 1): Pt will complete 3/3 toileting tasks on commode with mod A. OT Short Term Goal 4 (Week 1): Pt will complete functional transfers to all appropriate surfaces with LRAD with min A.  Skilled Therapeutic Interventions/Progress Updates:    Patient in bed, alert, family present for session.  Patient c/o HA 8/10, nursing alerted and provided medication during session.  She agrees to attempt adl tasks and states that she prefers to get up to w/c.  Supine to sitting edge of bed with mod A.  Sit to stand mod A, unable to turn (pushes to left), returned to sitting position, sit pivot transfer bed to w/c mod A.  Oral care seated at sink with min A and increased time - she perseverated and required max cues to move to upper body bathing.  UB bathing mod A, doff/donn OH shirt max A and max cues.  She c/o dizzyness in sitting - BP 141/86, HR 88, O2 100%, she also became tearful.  Returned to bed sit pivot max A,  sit to supine max A.  She states that she is more comfortable upon return to bed.  She remained in bed at close of session, bed alarm set and call bell in hand.    Therapy Documentation Precautions:  Precautions Precautions: Fall Precaution Comments: L hemipareisis, L inattention, mild pushing to L, L field of vision cut Restrictions Weight Bearing Restrictions: No   Therapy/Group: Individual Therapy  Carlos Levering 03/03/2021, 7:35 AM

## 2021-03-03 NOTE — Progress Notes (Signed)
Occupational Therapy Session Note  Patient Details  Name: Tonya [name] MRN: [id number] Date of Birth: 19-Feb-1946  Today's Date: 03/03/2021 OT Individual Time: 1401-1502 OT Individual Time Calculation (min): 61 min    Short Term Goals: Week 1:  OT Short Term Goal 1 (Week 1): Pt will complete UB dressing with min A and use of hemi dressing techs prn to increase overall ADL independence. OT Short Term Goal 2 (Week 1): Pt will complete LB dressing with mod A and AE prn. OT Short Term Goal 3 (Week 1): Pt will complete 3/3 toileting tasks on commode with mod A. OT Short Term Goal 4 (Week 1): Pt will complete functional transfers to all appropriate surfaces with LRAD with min A.  Skilled Therapeutic Interventions/Progress Updates:    Pt received in bed with husband at bedside and consented to OT tx. Pt very lethargic and sleepy during entire session, requires cuing ot remain awake and demo's severe R side gaze preference. Pt req total A to transition from supine to EOB, max A to maintain sitting balance EOB, and total A for SPT from EOB to w/c. Pt unable to sequence or take any steps during all transfers this session. Pt demo's no righting reactions throughout entire session and requires constant cuing for anterior sit and to attend to L side of environment. Pt transferred with total A from w/c to EOM, provided pt with mirror for visual feedback to address midline orientation. Pt req max cuing to look at herself in the mirror, max A for cuing to scan to L visual field and to say how many fingers therapist was holding up. Due to fatigue, pt then helped back to w/c with 2nd helper for safety, instructed in seated visual scanning activity with matching cards. Pt required max cuing for task, then over/undershoots when trying to match card with one on board. Therapist applied tape to pt's L glasses lens to encourage pt to turn her head to see entire L side. Pt's accuracy improved with placing cards  after tape applied. Pt req mod A for fwd sit in w/c with functional reach to match cards with multimodal cues, demo's overall very poor core strength and problem solving and sequencing. After tx, pt handed off to PT.  Therapy Documentation Precautions:  Precautions Precautions: Fall Precaution Comments: L hemipareisis, L inattention, mild pushing to L, L field of vision cut Restrictions Weight Bearing Restrictions: No  Vital Signs: Therapy Vitals Temp: 98.7 F (37.1 C) Pulse Rate: 78 Resp: 17 BP: 103/90 Patient Position (if appropriate): Lying Oxygen Therapy SpO2: 100 % O2 Device: Room Air Pain: 0/10      Therapy/Group: Individual Therapy  [name] 03/03/2021, 3:47 PM

## 2021-03-03 NOTE — Progress Notes (Signed)
Physical Therapy Session Note  Patient Details  Name: Tonya [name] MRN: [id number] Date of Birth: July 09, 1946  Today's Date: 03/03/2021 PT Individual Time: 4098-1191 PT Individual Time Calculation (min): 28 min   Short Term Goals: Week 1:  PT Short Term Goal 1 (Week 1): Pt will improve bed mobility to Mod A overall. PT Short Term Goal 2 (Week 1): Pt will perform STS transfers with Mod A to RW. PT Short Term Goal 3 (Week 1): Pt will perform SPVT transfers with Mod/ Max A using RW and decreased cues for sequencing. PT Short Term Goal 4 (Week 1): Pt will initiate gait training. PT Short Term Goal 5 (Week 1): Pt will initiate w/c mobility training.  Skilled Therapeutic Interventions/Progress Updates:    Pt greeted seated in w/c in rehab gym, hand off of care from OT. Pt agreeable to PT treatment, no reports of pain. Reports she needs to get back to her room to schedule a routine eye exam for a check up... she's oriented to situation is aware of her stroke deficits with L hemibody weakness but lacks full awareness of complete deficits. Required maxA for forward scooting in w/c with delayed initiation noted. Completed over-the-back squat<>pivot transfer with maxA+2 from w/c to mat table with L knee block. While seated EOB, she requires minA for static sitting balance. Provided mirror for visual aid to improve midline awareness and postural awareness. Pt with kyphotic sitting posture with forward leaning the L and unable to correct without max cues. While seated, worked on cervical head turns to the L and reaching for objects for the L. Also completed L lateral elbow leans x5 bouts. Cervical rotations limiting due to R gaze preference and difficulty tracking to the L - she did better with visual rather than verbal cueing. Completed squat<>pivot transfer in similar manner as above to her w/c and wheeled back to her room where she completed additional squat<>pivot transfer with maxA +2. She  required maxA for sit>supine and +2 totalA for supine scooting towards [facility] for repositioning. Placed pillow to support LUE and folded blanket to promote L attention/gaze. Bed alarm activated and all needs within reach at end of session.   Therapy Documentation Precautions:  Precautions Precautions: Fall Precaution Comments: L hemipareisis, L inattention, mild pushing to L, L field of vision cut Restrictions Weight Bearing Restrictions: No General:     Therapy/Group: Individual Therapy  Alger Simons 03/03/2021, 7:49 AM

## 2021-03-03 NOTE — Progress Notes (Signed)
Speech Language Pathology Daily Session Note  Patient Details  Name: Tonya [name] MRN: [id number] Date of Birth: Apr 10, 1946  Today's Date: 03/03/2021 SLP Individual Time: 0930-1000 SLP Individual Time Calculation (min): 30 min  Short Term Goals: Week 1: SLP Short Term Goal 1 (Week 1): Patient will participate in BSE (bedside swallow evaluation) to ensure PO toleration. SLP Short Term Goal 2 (Week 1): Patient will recall and describe specific therapeutic interventions with PT/OT/SLP with modA cues. SLP Short Term Goal 3 (Week 1): Patient will demonstrate sustained attention to basic level problem solving tasks with modA cues. SLP Short Term Goal 4 (Week 1): Patient will demonstrate adequate reasoning and problem solving when completed simulated functional tasks with modA cues. SLP Short Term Goal 5 (Week 1): Patient will demonstrate consistent emergent awareness to deficits and limitations, with modA -cues. SLP Short Term Goal 6 (Week 1): Patient will perform basic-mild complex organizing and/or sequencing tasks with modA cues.  Skilled Therapeutic Interventions: Skilled ST intervention performed with focus on cognitive goals. Her friend was present at bedside. ST facilitated sustained attention with basic problem solving and reasoning task with mod-to-max A verbal cues. Patient required increased verbal re-direction to transition from one concept to the next, and exhibited minimal awareness of errors. Required mod-to-max A verbal and visual cues for simple sequencing. Patient was passed off to OT for next session. Continue per ST POC.    Pain Pain Assessment Pain Scale: 0-10 Pain Score: 0-No pain  Therapy/Group: Individual Therapy  Laura Radilla T Laureen Frederic 03/03/2021, 10:01 AM

## 2021-03-03 NOTE — Discharge Instructions (Addendum)
Inpatient Rehab Discharge Instructions  Tonya [name] Discharge date and time: 03/25/21   Activities/Precautions/ Functional Status: Activity: no lifting, driving, or strenuous exercise till cleared by MD Diet: cardiac diet Wound Care: none needed   Functional status:  ___ No restrictions     ___ Walk up steps independently __X_ 24/7 supervision/assistance   ___ Walk up steps with assistance ___ Intermittent supervision/assistance  ___ Bathe/dress independently ___ Walk with walker     __X_ Bathe/dress with assistance ___ Walk Independently    ___ Shower independently _X__ Walk with assistance    ___ Shower with assistance __X_ No alcohol     ___ Return to work/school ________   COMMUNITY REFERRALS UPON DISCHARGE:    Home Health:   [place] Phone: [phone]    Medical Equipment/Items Ordered: Conservation officer, nature, Producer, television/film/video, Drop Arm Commode                                                 Agency/Supplier:[employer]   Special Instructions: NO ASPIRIN, ASPIRIN CONTAINING PRODUCTS, NSAIDS OR OTHER BLOOD THINNER. NO ALCOHOL 2. NO DRIVING TILL CLEARED BY DR. Dianna Limbo OR NEUROLOGY DUE TO FIELD CUT 3. HUSBAND NEEDS TO MANAGE MEDICATIONS--FOLLOW UP WITH DR. Loma Sousa FOR FURTHER ADJUSTMENT.   STROKE/TIA DISCHARGE INSTRUCTIONS SMOKING Cigarette smoking nearly doubles your risk of having a stroke & is the single most alterable risk factor  If you smoke or have smoked in the last 12 months, you are advised to quit smoking for your health. Most of the excess cardiovascular risk related to smoking disappears within a year of stopping. Ask you doctor about anti-smoking medications [place] Quit Line: 1-800-QUIT NOW Free Smoking Cessation Classes [phone]  CHOLESTEROL Know your levels; limit fat & cholesterol in your diet  Lipid Panel     Component Value Date/Time   CHOL 204 (H) 02/23/2021 0344   TRIG 98  02/23/2021 0344   HDL 83 02/23/2021 0344   CHOLHDL 2.5 02/23/2021 0344   VLDL 20 02/23/2021 0344   LDLCALC 101 (H) 02/23/2021 0344     Many patients benefit from treatment even if their cholesterol is at goal. Goal: Total Cholesterol (CHOL) less than 160 Goal:  Triglycerides (TRIG) less than 150 Goal:  HDL greater than 40 Goal:  LDL (LDLCALC) less than 100   BLOOD PRESSURE American Stroke Association blood pressure target is less that 120/80 mm/Hg  Your discharge blood pressure is:  BP: 103/90 Monitor your blood pressure Limit your salt and alcohol intake Many individuals will require more than one medication for high blood pressure  DIABETES (A1c is a blood sugar average for last 3 months) Goal HGBA1c is under 7% (HBGA1c is blood sugar average for last 3 months)  Diabetes:     Lab Results  Component Value Date   HGBA1C 5.4 02/23/2021    Your HGBA1c can be lowered with medications, healthy diet, and exercise. Check your blood sugar as directed by your physician Call your physician if you experience unexplained or low blood sugars.  PHYSICAL ACTIVITY/REHABILITATION Goal is 30 minutes at least 4 days per week  Activity: No driving, Therapies: see above Return to work: N/a Activity decreases your  risk of heart attack and stroke and makes your heart stronger.  It helps control your weight and blood pressure; helps you relax and can improve your mood. Participate in a regular exercise program. Talk with your doctor about the best form of exercise for you (dancing, walking, swimming, cycling).  DIET/WEIGHT Goal is to maintain a healthy weight  Your discharge diet is:  Diet Order             Diet heart healthy; Fluid consistency: Thin  Diet effective now                   liquids Your height is:  Height: 5\' 4"  (162.6 cm) Your current weight is: Weight: 59.9 kg Your Body Mass Index (BMI) is:  BMI (Calculated): 22.66 Following the type of diet specifically designed for you  will help prevent another stroke. Your goal weight is Your goal Body Mass Index (BMI) is 19-24. Healthy food habits can help reduce 3 risk factors for stroke:  High cholesterol, hypertension, and excess weight.  RESOURCES Stroke/Support Group:  Call [phone]   STROKE EDUCATION PROVIDED/REVIEWED AND GIVEN TO PATIENT Stroke warning signs and symptoms How to activate emergency medical system (call 911). Medications prescribed at discharge. Need for follow-up after discharge. Personal risk factors for stroke. Pneumonia vaccine given:  Flu vaccine given:  My questions have been answered, the writing is legible, and I understand these instructions.  I will adhere to these goals & educational materials that have been provided to me after my discharge from the hospital.      My questions have been answered and I understand these instructions. I will adhere to these goals and the provided educational materials after my discharge from the hospital.  Patient/Caregiver Signature _______________________________ Date __________  Clinician Signature _______________________________________ Date __________  Please bring this form and your medication list with you to all your follow-up doctor's appointments.

## 2021-03-03 NOTE — Progress Notes (Signed)
PROGRESS NOTE   Subjective/Complaints: Reviewed CT head no change in R parietal IPH Labs reviewed, Hgb improving  BMET nl  DIscussed visual illusions as part of RIght parietal strokes   ROS- neg CP, SOB, N/V, +migraine Objective:   CT HEAD WO CONTRAST  Result Date: 03/02/2021 CLINICAL DATA:  Headache, known intracranial hemorrhage. EXAM: CT HEAD WITHOUT CONTRAST TECHNIQUE: Contiguous axial images were obtained from the base of the skull through the vertex without intravenous contrast. COMPARISON:  CT February 22, 2021. FINDINGS: Brain: No substantial change in the size of the intraparenchymal hemorrhage centered in the high right parietal lobe, which measures approximately 4.0 x 3.0 x 4.7 cm (similar when remeasured on the prior). There is increased surrounding edema with similar sulcal effacement. No midline shift. Size increased effacement of the right lateral ventricle posteriorly. No visible intraventricular hemorrhage. No hydrocephalus. Similar right convexity subarachnoid hemorrhage. Basal cistern hemorrhage has resolved. No evidence of acute large vascular territory infarct. Basal cisterns are patent. Vascular: No hyperdense vessel identified. Calcific intracranial atherosclerosis. Skull: No acute fracture. Sinuses/Orbits: Clear visualized sinuses. Other: No mastoid effusions. IMPRESSION: 1. No substantial change in the size of the intraparenchymal hemorrhage centered in the high right parietal lobe. Increased surrounding edema with slightly increased effacement of the right lateral ventricle posteriorly. No hydrocephalus or midline shift. 2. Similar right convexity subarachnoid hemorrhage. Basal cistern hemorrhage has resolved. Electronically Signed   By: Margaretha Sheffield MD   On: 03/02/2021 15:06   Recent Labs    03/03/21 0527  WBC 5.4  HGB 10.6*  HCT 32.0*  PLT 388    Recent Labs    03/03/21 0527  NA 137  K 4.5  CL 106   CO2 23  GLUCOSE 103*  BUN 20  CREATININE 0.83  CALCIUM 9.8     Intake/Output Summary (Last 24 hours) at 03/03/2021 1118 Last data filed at 03/02/2021 1323 Gross per 24 hour  Intake 118 ml  Output --  Net 118 ml         Physical Exam: Vital Signs Blood pressure (!) 152/84, pulse 72, temperature 98.7 F (37.1 C), temperature source Oral, resp. rate 18, height 5\' 4"  (1.626 m), weight 59.9 kg, SpO2 100 %.  General: No acute distress Mood and affect are appropriate Heart: Regular rate and rhythm no rubs murmurs or extra sounds Lungs: Clear to auscultation, breathing unlabored, no rales or wheezes Abdomen: Positive bowel sounds, soft nontender to palpation, nondistended Extremities: No clubbing, cyanosis, or edema Skin: No evidence of breakdown, no evidence of rash  Extremities: No clubbing, cyanosis, or edema Skin: No evidence of breakdown, no evidence of rash Neurologic: Alert and oriented  motor strength is 5/5 in right and 3- Leftdeltoid, bicep, tricep, grip, trace hip flexor, knee extensors, ankle dorsiflexor and plantar flexor Sensory exam normal sensation to light touch RIght and absent left  upper and lower extremities Cerebellar exam unable to perform on left due to weakness Musculoskeletal: Full range of motion in all 4 extremities. No joint swelling   Assessment/Plan: 1. Functional deficits which require 3+ hours per day of interdisciplinary therapy in a comprehensive inpatient rehab setting. Physiatrist is providing close team supervision and 24  hour management of active medical problems listed below. Physiatrist and rehab team continue to assess barriers to discharge/monitor patient progress toward functional and medical goals  Care Tool:  Bathing    Body parts bathed by patient: Left arm, Chest, Abdomen, Front perineal area, Right upper leg, Left upper leg, Face   Body parts bathed by helper: Right arm, Buttocks, Right lower leg, Left lower leg     Bathing  assist Assist Level: Moderate Assistance - Patient 50 - 74%     Upper Body Dressing/Undressing Upper body dressing   What is the patient wearing?: Pull over shirt    Upper body assist Assist Level: Maximal Assistance - Patient 25 - 49%    Lower Body Dressing/Undressing Lower body dressing      What is the patient wearing?: Pants     Lower body assist Assist for lower body dressing: 2 Helpers     Toileting Toileting    Toileting assist Assist for toileting: 2 Helpers     Transfers Chair/bed transfer  Transfers assist  Chair/bed transfer activity did not occur: Safety/medical concerns  Chair/bed transfer assist level: Maximal Assistance - Patient 25 - 49% (squat pivot)     Locomotion Ambulation   Ambulation assist   Ambulation activity did not occur: Refused  Assist level: 2 helpers Assistive device: Other (comment) (R hallway rail) Max distance: 42ft   Walk 10 feet activity   Assist  Walk 10 feet activity did not occur: Refused        Walk 50 feet activity   Assist Walk 50 feet with 2 turns activity did not occur: Safety/medical concerns         Walk 150 feet activity   Assist Walk 150 feet activity did not occur: Safety/medical concerns         Walk 10 feet on uneven surface  activity   Assist Walk 10 feet on uneven surfaces activity did not occur: Safety/medical concerns         Wheelchair     Assist Will patient use wheelchair at discharge?: Yes Type of Wheelchair: Manual      Max wheelchair distance: 20 ft    Wheelchair 50 feet with 2 turns activity    Assist        Assist Level: Maximal Assistance - Patient 25 - 49%   Wheelchair 150 feet activity     Assist      Assist Level: Dependent - Patient 0%, Total Assistance - Patient < 25%   Blood pressure (!) 152/84, pulse 72, temperature 98.7 F (37.1 C), temperature source Oral, resp. rate 18, height 5\' 4"  (1.626 m), weight 59.9 kg, SpO2 100 %.     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  Antithrombotics: -DVT/anticoagulation:  Mechanical: Sequential compression devices, below knee Bilateral lower extremities             -antiplatelet therapy: N/A 3. Headaches/Pain Management: Oxycodone prn for HA             --monitor for symptoms--may need Topamax. 4. Anxiety: LCSW to follow for evaluation and support. Recommended to continue to minimize use of Ativan due to negative effects of long term use on cognition.             -  antipsychotic agents: N/A 5. Neuropsych: This patient is not capable of making decisions on her own behalf. 6. Skin/Wound Care: Routine pressure relief measures. D/c IV after magnesium supplementation as per patient request 7. Fluids/Electrolytes/Nutrition: Monitor I/O. Check CMET in am. 8. HTN: Monitor BP tid--continue Lisinopril and metoprolol. --Poorly controlled -->SBP goal<160 9. Seizure prophylaxis: As per discharge summary, neurology recommended discontinuing Keppra in case it was contributing to hallucinations, but states that hallucinations were present prior to admission and patient has been tremulous/twitchy raising some concern for seizure. 6/19 to 6/20 showed no evidence of seizures. Consider discontinuing 6/25 one week after ICH if remains seizure free. d/c 6/27 10. Intermittent fevers: Being monitored and have resolved without treatment. 11. Anemia of chronic illness?: Continue to monitor H/H with serial checks--around 9 since 12/2020. 12. Hypomagnesemia: Resume home dose of supplement. IV supplementation on 6/25 13. Hypokalemia: Will add daily supplement.just added KCL  14. Delirium: thought to be secondary to benzodiazepine withdrawal but patient was only taking once per night at home. UA negative. Hypokalemic and hypomagnesemia as above, electrolytes otherwise stable. Aox3.  As per discharge summary, hallucinations were present prior to admission as well. May be CVA related , A and O this am   15.  Freq BM will reduce senna S to 1 po qhs 16.  Vascular HA add topiramate on 6/24- increased to 50mg  BID on 6/26 LOS: 4 days A FACE TO FACE EVALUATION WAS PERFORMED  Charlett Blake 03/03/2021, 11:18 AM

## 2021-03-04 NOTE — Plan of Care (Signed)
  Problem: Consults Goal: RH STROKE PATIENT EDUCATION Description: See Patient Education module for education specifics  Outcome: Progressing   Problem: RH BOWEL ELIMINATION Goal: RH STG MANAGE BOWEL WITH ASSISTANCE Description: STG Manage Bowel with mod I Assistance. Outcome: Progressing Goal: RH STG MANAGE BOWEL W/MEDICATION W/ASSISTANCE Description: STG Manage Bowel with Medication with mod I Assistance. Outcome: Progressing   Problem: RH BLADDER ELIMINATION Goal: RH STG MANAGE BLADDER WITH ASSISTANCE Description: STG Manage Bladder With out Assistance Outcome: Progressing   Problem: RH SAFETY Goal: RH STG ADHERE TO SAFETY PRECAUTIONS W/ASSISTANCE/DEVICE Description: STG Adhere to Safety Precautions With cues/reminders Assistance/Device. Outcome: Progressing   Problem: RH KNOWLEDGE DEFICIT Goal: RH STG INCREASE KNOWLEDGE OF HYPERTENSION Description: Patient will be able to manage HTN with medications and dietary modifications using handouts and educational tools independently Outcome: Progressing Goal: RH STG INCREASE KNOWLEGDE OF HYPERLIPIDEMIA Description: Patient will be able to manage HLD with medications and dietary modifications using handouts and educational tools independently Outcome: Progressing Goal: RH STG INCREASE KNOWLEDGE OF STROKE PROPHYLAXIS Description: Patient will be able to manage secondary stroke risks with medications and dietary modifications using handouts and educational tools independently Outcome: Progressing   Problem: RH COGNITION-NURSING Goal: RH STG USES MEMORY AIDS/STRATEGIES W/ASSIST TO PROBLEM SOLVE Description: STG Uses Memory Aids/Strategies With cues/reminders Assistance to Problem Solve. Outcome: Not Progressing   Problem: RH PAIN MANAGEMENT Goal: RH STG PAIN MANAGED AT OR BELOW PT'S PAIN GOAL Description: At or below level 4 Outcome: Not Progressing

## 2021-03-04 NOTE — Progress Notes (Signed)
Physical Therapy Session Note  Patient Details  Name: Tonya [name] MRN: [id number] Date of Birth: 1945/12/03  Today's Date: 03/04/2021 PT Individual Time: 1005-1052 PT Individual Time Calculation (min): 47 min   and  Today's Date: 03/04/2021 PT Missed Time: 13 Minutes Missed Time Reason: Patient fatigue  Short Term Goals: Week 1:  PT Short Term Goal 1 (Week 1): Pt will improve bed mobility to Mod A overall. PT Short Term Goal 2 (Week 1): Pt will perform STS transfers with Mod A to RW. PT Short Term Goal 3 (Week 1): Pt will perform SPVT transfers with Mod/ Max A using RW and decreased cues for sequencing. PT Short Term Goal 4 (Week 1): Pt will initiate gait training. PT Short Term Goal 5 (Week 1): Pt will initiate w/c mobility training.  Skilled Therapeutic Interventions/Progress Updates:    Pt received sitting in w/c with her husband, [name], present. Pt reports she is experiencing 7/10 low back and 7/10 migraine headache pain - details below. Pt appears to have a much more down/deflated mood today compared to previously with this therapist. Therapist inquires as to the change and pt just reports she is cold and having increased migraine pain (states it is worse than on Saturday). With encouragement, pt agreeable to therapy session. Donned socks and shoes total assist. Transported to/from gym in w/c for time management and energy conservation. Pt also noted to have increased sensitivity to touch in L hemibody throughout session - 3x saying "owe" when therapist assisting with L LE positioning during transferring and when donning L shoe. L squat pivot transfer w/c>EOM max assist and blocking L LE to promote WBing - max cuing for head/hips relationship and for increased R anterior trunk lean. Sit<>stands to/from EOM with heavy max assist of 1 for lifting, balance due to strong L lean, and blocking L knee buckle - provided mirror feedback and external target of "bumping" hips with +2 on R  side to promote R weight shift with increased L hip/knee extension. Progressed to standing reaching task with R UE to promote R weight shift and increased L hip/knee extension, followed by L attention and visual scanning task to place in specified location - max assist of 1 for balance and blocking L knee buckle - max manual facilitation for increased L hip/knee extension and to maintain trunk balance due to intermittent posterior lean. Pt reports she is tired and feeling like she needs to "catch her breath" - provided seated rest break; however, pt requests to return to room to go to bed and rest, declines continuing with therapy session. R squat pivot EOM>w/c max assist for lifting/pivoting hips and blocking L LE. L squat pivot to EOB with heavy max assist due to pt not letting go of w/c arm rest with R hand due to poor motor planning making it more difficult to complete transfer. Pt starts laying down on R side towards foot of the bed due to L inattention and impaired awareness of the bed orientation - total assist to bring trunk upright and cuing to lay down towards HOB. Total assist sit>supine due to trunk descent and L hemibody management onto bed. Pt left supine in bed with needs in reach, blankets on, L UE positioned on pillows, husband present, and bed alarm on. Missed 13 minutes of skilled physical therapy.  Educated pt's husband on pt's L inattention and encouraged engaging with her from this side to promote increased visual scanning and attention towards the L - he moved to sit  on this side of the bed prior to thearpist exiting.  Therapy Documentation Precautions:  Precautions Precautions: Fall Precaution Comments: L hemipareisis, L inattention, mild pushing to L, L field of vision cut Restrictions Weight Bearing Restrictions: No   Pain: Pain Assessment Pain Scale: 0-10 Pain Score: 7  Pain Type: Acute pain Pain Location: Back Pain Orientation: Lower Pain Descriptors / Indicators:  Throbbing;Aching Pain Frequency: Constant Pain Onset: On-going Patients Stated Pain Goal: 0 Pain Intervention(s): Medication (See eMAR) Multiple Pain Sites: Yes 2nd Pain Site Pain Score: 7 Pain Type: Chronic pain (had migraine for several days) Pain Location: Other (Comment) (migraine) Pain Descriptors / Indicators: Aching;Throbbing Pain Onset: Other (Comment) (starts as soon as she wakes up) Patient's Stated Pain Goal: 1 Pain Intervention(s): RN made aware;Relaxation     Therapy/Group: Individual Therapy  Tawana Scale , PT, DPT, CSRS 03/04/2021, 11:00 AM

## 2021-03-04 NOTE — Progress Notes (Signed)
PROGRESS NOTE   Subjective/Complaints:  Still with intermittent visual illusions, bouts of lethargy but no new issues , discussed prognosis with husband ROS- neg CP, SOB, N/V, +migraine Objective:   CT HEAD WO CONTRAST  Result Date: 03/02/2021 CLINICAL DATA:  Headache, known intracranial hemorrhage. EXAM: CT HEAD WITHOUT CONTRAST TECHNIQUE: Contiguous axial images were obtained from the base of the skull through the vertex without intravenous contrast. COMPARISON:  CT February 22, 2021. FINDINGS: Brain: No substantial change in the size of the intraparenchymal hemorrhage centered in the high right parietal lobe, which measures approximately 4.0 x 3.0 x 4.7 cm (similar when remeasured on the prior). There is increased surrounding edema with similar sulcal effacement. No midline shift. Size increased effacement of the right lateral ventricle posteriorly. No visible intraventricular hemorrhage. No hydrocephalus. Similar right convexity subarachnoid hemorrhage. Basal cistern hemorrhage has resolved. No evidence of acute large vascular territory infarct. Basal cisterns are patent. Vascular: No hyperdense vessel identified. Calcific intracranial atherosclerosis. Skull: No acute fracture. Sinuses/Orbits: Clear visualized sinuses. Other: No mastoid effusions. IMPRESSION: 1. No substantial change in the size of the intraparenchymal hemorrhage centered in the high right parietal lobe. Increased surrounding edema with slightly increased effacement of the right lateral ventricle posteriorly. No hydrocephalus or midline shift. 2. Similar right convexity subarachnoid hemorrhage. Basal cistern hemorrhage has resolved. Electronically Signed   By: Margaretha Sheffield MD   On: 03/02/2021 15:06   Recent Labs    03/03/21 0527  WBC 5.4  HGB 10.6*  HCT 32.0*  PLT 388    Recent Labs    03/03/21 0527  NA 137  K 4.5  CL 106  CO2 23  GLUCOSE 103*  BUN 20   CREATININE 0.83  CALCIUM 9.8     Intake/Output Summary (Last 24 hours) at 03/04/2021 0859 Last data filed at 03/04/2021 5916 Gross per 24 hour  Intake 100 ml  Output --  Net 100 ml         Physical Exam: Vital Signs Blood pressure 111/68, pulse 76, temperature 98.1 F (36.7 C), temperature source Oral, resp. rate 18, height 5\' 4"  (1.626 m), weight 59.9 kg, SpO2 100 %.  General: No acute distress Mood and affect are appropriate Heart: Regular rate and rhythm no rubs murmurs or extra sounds Lungs: Clear to auscultation, breathing unlabored, no rales or wheezes Abdomen: Positive bowel sounds, soft nontender to palpation, nondistended   Extremities: No clubbing, cyanosis, or edema Skin: No evidence of breakdown, no evidence of rash Neurologic: Alert and oriented  motor strength is 5/5 in right and 3- Leftdeltoid, bicep, tricep, grip, trace hip flexor, knee extensors, ankle dorsiflexor and plantar flexor Sensory exam normal sensation to light touch RIght and absent left  upper and lower extremities Cerebellar exam unable to perform on left due to weakness Musculoskeletal: Full range of motion in all 4 extremities. No joint swelling   Assessment/Plan: 1. Functional deficits which require 3+ hours per day of interdisciplinary therapy in a comprehensive inpatient rehab setting. Physiatrist is providing close team supervision and 24 hour management of active medical problems listed below. Physiatrist and rehab team continue to assess barriers to discharge/monitor patient progress toward functional and medical goals  Care Tool:  Bathing    Body parts bathed by patient: Left arm, Chest, Abdomen, Front perineal area, Right upper leg, Left upper leg, Face   Body parts bathed by helper: Right arm, Buttocks, Right lower leg, Left lower leg     Bathing assist Assist Level: Moderate Assistance - Patient 50 - 74%     Upper Body Dressing/Undressing Upper body dressing   What is the  patient wearing?: Pull over shirt    Upper body assist Assist Level: Maximal Assistance - Patient 25 - 49%    Lower Body Dressing/Undressing Lower body dressing      What is the patient wearing?: Pants     Lower body assist Assist for lower body dressing: 2 Helpers     Toileting Toileting    Toileting assist Assist for toileting: 2 Helpers     Transfers Chair/bed transfer  Transfers assist  Chair/bed transfer activity did not occur: Safety/medical concerns  Chair/bed transfer assist level: Maximal Assistance - Patient 25 - 49% (squat pivot)     Locomotion Ambulation   Ambulation assist   Ambulation activity did not occur: Refused  Assist level: 2 helpers Assistive device: Other (comment) (R hallway rail) Max distance: 67ft   Walk 10 feet activity   Assist  Walk 10 feet activity did not occur: Refused        Walk 50 feet activity   Assist Walk 50 feet with 2 turns activity did not occur: Safety/medical concerns         Walk 150 feet activity   Assist Walk 150 feet activity did not occur: Safety/medical concerns         Walk 10 feet on uneven surface  activity   Assist Walk 10 feet on uneven surfaces activity did not occur: Safety/medical concerns         Wheelchair     Assist Will patient use wheelchair at discharge?: Yes Type of Wheelchair: Manual      Max wheelchair distance: 20 ft    Wheelchair 50 feet with 2 turns activity    Assist        Assist Level: Maximal Assistance - Patient 25 - 49%   Wheelchair 150 feet activity     Assist      Assist Level: Dependent - Patient 0%, Total Assistance - Patient < 25%   Blood pressure 111/68, pulse 76, temperature 98.1 F (36.7 C), temperature source Oral, resp. rate 18, height 5\' 4"  (1.626 m), weight 59.9 kg, SpO2 100 %.    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  Antithrombotics: -DVT/anticoagulation:  Mechanical: Sequential compression devices, below knee Bilateral lower extremities             -antiplatelet therapy: N/A 3. Headaches/Pain Management: Oxycodone prn for HA             --monitor for symptoms--may need Topamax. 4. Anxiety: LCSW to follow for evaluation and support. Recommended to continue to minimize use of Ativan due to negative effects of long term use on cognition.             -antipsychotic agents: N/A 5. Neuropsych: This patient is  not capable of making decisions on her own behalf. 6. Skin/Wound Care: Routine pressure relief measures. D/c IV after magnesium supplementation as per patient request 7. Fluids/Electrolytes/Nutrition: Monitor I/O. Check CMET in am. 8. HTN: Monitor BP tid--continue Lisinopril and metoprolol. --Poorly controlled -->SBP goal<160 9. Seizure prophylaxis: As per discharge summary, neurology recommended discontinuing Keppra in case it was contributing to hallucinations, but states that hallucinations were present prior to admission and patient has been tremulous/twitchy raising some concern for seizure. 6/19 to 6/20 showed no evidence of seizures. Consider discontinuing 6/25 one week after ICH if remains seizure free. d/c 6/27 10. Intermittent fevers: Being monitored and have resolved without treatment. 11. Anemia of chronic illness?: Continue to monitor H/H with serial checks--around 9 since 12/2020. 12. Hypomagnesemia: Resume home dose of supplement. IV supplementation on 6/25 13. Hypokalemia: Will add daily supplement.just added KCL  14. 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  Freq BM will reduce senna S to 1 po qhs 16.  Vascular HA add topiramate on 6/24- increased to 50mg  BID on 6/26- improving  LOS: 5 days A FACE TO FACE EVALUATION WAS  PERFORMED  Tonya [name] 03/04/2021, 8:59 AM

## 2021-03-04 NOTE — Progress Notes (Signed)
Speech Language Pathology Daily Session Note  Patient Details  Name: [name] MRN: [id number] Date of Birth: July 27, 1946  Today's Date: 03/04/2021 SLP Individual Time: 6222-9798 SLP Individual Time Calculation (min): 27 min  Short Term Goals: Week 1: SLP Short Term Goal 1 (Week 1): Patient will participate in BSE (bedside swallow evaluation) to ensure PO toleration. SLP Short Term Goal 2 (Week 1): Patient will recall and describe specific therapeutic interventions with PT/OT/SLP with modA cues. SLP Short Term Goal 3 (Week 1): Patient will demonstrate sustained attention to basic level problem solving tasks with modA cues. SLP Short Term Goal 4 (Week 1): Patient will demonstrate adequate reasoning and problem solving when completed simulated functional tasks with modA cues. SLP Short Term Goal 5 (Week 1): Patient will demonstrate consistent emergent awareness to deficits and limitations, with modA -cues. SLP Short Term Goal 6 (Week 1): Patient will perform basic-mild complex organizing and/or sequencing tasks with modA cues.  Skilled Therapeutic Interventions:Skilled ST services focused on cognitive skills. SLP facilitated sustained attention, left scanning, basic problem solving and error awareness in simple card sorting task by colors in a field of 6, Pt required mod A verbal cues to scan left, min A verbal cues for problem solving/error awareness and sustained attention in 5 minute intervals. Pt was left in room with call bell within reach and bed alarm set. SLP recommends to continue skilled services.     Pain Pain Assessment Pain Scale: Faces Pain Score: 0-No pain Faces Pain Scale: Hurts little more Pain Type: Acute pain Pain Location: Head Pain Orientation: Anterior;Left Pain Descriptors / Indicators: Headache Pain Frequency: Constant Pain Onset: On-going Patients Stated Pain Goal: 0 Pain Intervention(s): Repositioned Multiple Pain Sites: Yes 2nd Pain Site Pain  Score: 7 Pain Type: Chronic pain (had migraine for several days) Pain Location: Other (Comment) (migraine) Pain Descriptors / Indicators: Aching;Throbbing Pain Onset: Other (Comment) (starts as soon as she wakes up) Patient's Stated Pain Goal: 1 Pain Intervention(s): RN made aware;Relaxation  Therapy/Group: Individual Therapy  Demitrios Molyneux  [facility] 03/04/2021, 1:43 PM

## 2021-03-04 NOTE — Progress Notes (Signed)
Occupational Therapy Session Note  Patient Details  Name: Tonya [name] MRN: [id number] Date of Birth: [birth year]  Today's Date: 03/04/2021 OT Individual Time: 0901-1002 OT Individual Time Calculation (min): 61 min    Short Term Goals: Week 1:  OT Short Term Goal 1 (Week 1): Pt will complete UB dressing with min A and use of hemi dressing techs prn to increase overall ADL independence. OT Short Term Goal 2 (Week 1): Pt will complete LB dressing with mod A and AE prn. OT Short Term Goal 3 (Week 1): Pt will complete 3/3 toileting tasks on commode with mod A. OT Short Term Goal 4 (Week 1): Pt will complete functional transfers to all appropriate surfaces with LRAD with min A.  Skilled Therapeutic Interventions/Progress Updates:    Session 1: (4854-6270)  Pt in bed with spouse in the room to start session.  Pt is reporting increased pain in the head and her back.  She needed mod instructional cueing for initiation of bed mobility with max assist for supine to sit on the left side.  She was oriented to place, but needed mod questioning cueing for orientation of time.  On some occasions when asked questions, she would perseverate on the answer stated from a previous question.  Once sitting, she needed mod assist for dynamic sitting balance while working on [employer].  Max instructional cueing for scanning left of midline to locate soap.  Decreased motor planning and sequencing noted as when she would squeeze out the washcloth with the right hand, she would drop it back down in the water, when asked to pick up the soap.  She needed max instructional cueing with max hand over hand assistance to use the LUE for squeezing out the washcloth or attempting to wash the right arm.  Increased gaze to the right with head turned to the right and cervical flexion noted.  She completed squat pivot transfer to the wheelchair with max assist for oral hygiene at the sink.  She needed min assist for setup with  max hand over hand to use with the LUE to hold the toothpaste and squeeze it out on the toothbrush.  Finished session with pt sitting up in the wheelchair with the call button and phone in reach with safety belt in place.  Her spouse was present for supervision as well.   Session 2: (1420-1504)  Pt in bed to start session with spouse present.  She needed mod assist for supine to sit on the left side of the bed.  Max assist for stand pivot transfer to the wheelchair after therapist assisted with donning shoes EOB.  She was able to maintain sitting balance with min assist while therapist assisted with donning shoes.  Had her work on sitting on [place] while engaged in use of the BITs system.  She completed 3 sets of 2 mins on Visual Scanning program.  She maintains head turn and gaze to the right with decreased awareness of midline.  She scored 14 seconds and 19 seconds between each dot with max instructional/demonstrational cueing to scan left to and past midline to locate the blue dots.  She used the RUE for pressing them after locating them.  On the last one, the dots were made more centralized and she still needed an average of 12 seconds with max instructional cueing for locating them.  Finished session with return to the room and pt left sitting up in the wheelchair with the call button and phone in reach and  spouse present.  Safety belt in place.  Encouraged pt's spouse to continue working on having pt scan left of midline to locate him as well as other items around the room.   Therapy Documentation Precautions:  Precautions Precautions: Fall Precaution Comments: L hemipareisis, L inattention, mild pushing to L, L field of vision cut Restrictions Weight Bearing Restrictions: No   Pain: Pain Assessment Pain Scale: Faces Pain Score: 5  Faces Pain Scale: Hurts little more Pain Type: Acute pain Pain Location: Head Pain Orientation: Anterior;Left Pain Descriptors / Indicators: Headache Pain  Frequency: Constant Pain Onset: On-going Patients Stated Pain Goal: 0 Pain Intervention(s): Repositioned Multiple Pain Sites: Yes 2nd Pain Site Pain Score: 7 Pain Type: Chronic pain (had migraine for several days) Pain Location: Other (Comment) (migraine) Pain Descriptors / Indicators: Aching;Throbbing Pain Onset: Other (Comment) (starts as soon as she wakes up) Patient's Stated Pain Goal: 1 Pain Intervention(s): RN made aware;Relaxation ADL: See Care Tool Section for some details of mobility and selfcare  Therapy/Group: Individual Therapy  Al Gagen OTR/L 03/04/2021, 12:30 PM

## 2021-03-05 MED ORDER — PROSOURCE PLUS PO LIQD
30.0000 mL | Freq: Every day | ORAL | Status: DC
  Administered 2021-03-05 – 2021-03-07 (×3): 30 mL via ORAL
  Filled 2021-03-05 (×3): qty 30

## 2021-03-05 MED ORDER — ENSURE ENLIVE PO LIQD
237.0000 mL | Freq: Four times a day (QID) | ORAL | Status: DC
  Administered 2021-03-05 – 2021-03-19 (×40): 237 mL via ORAL
  Filled 2021-03-05: qty 237

## 2021-03-05 NOTE — Progress Notes (Signed)
PROGRESS NOTE   Subjective/Complaints: Spoke with pt , daughter and husband at length regarding deficits  ROS- neg CP, SOB, N/V, +migraine Objective:   No results found. Recent Labs    03/03/21 0527  WBC 5.4  HGB 10.6*  HCT 32.0*  PLT 388    Recent Labs    03/03/21 0527  NA 137  K 4.5  CL 106  CO2 23  GLUCOSE 103*  BUN 20  CREATININE 0.83  CALCIUM 9.8     Intake/Output Summary (Last 24 hours) at 03/05/2021 1226 Last data filed at 03/04/2021 2053 Gross per 24 hour  Intake 170 ml  Output --  Net 170 ml         Physical Exam: Vital Signs Blood pressure 102/62, pulse 84, temperature 97.9 F (36.6 C), temperature source Oral, resp. rate 16, height _0  (1.626 m), weight 59.9 kg, SpO2 100 %.  General: No acute distress Mood and affect are appropriate Heart: Regular rate and rhythm no rubs murmurs or extra sounds Lungs: Clear to auscultation, breathing unlabored, no rales or wheezes Abdomen: Positive bowel sounds, soft nontender to palpation, nondistended  Extremities: No clubbing, cyanosis, or edema Skin: No evidence of breakdown, no evidence of rash Neurologic: Alert and oriented  motor strength is 5/5 in right and 3- Leftdeltoid, bicep, tricep, grip, trace hip flexor, knee extensors, ankle dorsiflexor and plantar flexor Sensory exam normal sensation to light touch RIght and absent left  upper and lower extremities Cerebellar exam unable to perform on left due to weakness Musculoskeletal: Full range of motion in all 4 extremities. No joint swelling   Assessment/Plan: 1. Functional deficits which require 3+ hours per day of interdisciplinary therapy in a comprehensive inpatient rehab setting. Physiatrist is providing close team supervision and 24 hour management of active medical problems listed below. Physiatrist and rehab team continue to assess barriers to discharge/monitor patient progress toward  functional and medical goals  Care Tool:  Bathing    Body parts bathed by patient: Chest, Abdomen, Face, Left arm, Right upper leg, Left upper leg   Body parts bathed by helper: Left lower leg, Right lower leg, Front perineal area, Buttocks, Right arm Body parts n/a: Right upper leg, Left upper leg, Buttocks, Front perineal area, Left lower leg, Right lower leg (did not attempt)   Bathing assist Assist Level: 2 Helpers (sit to stand)     Upper Body Dressing/Undressing Upper body dressing   What is the patient wearing?: Pull over shirt    Upper body assist Assist Level: Maximal Assistance - Patient 25 - 49%    Lower Body Dressing/Undressing Lower body dressing      What is the patient wearing?: Pants, Incontinence brief     Lower body assist Assist for lower body dressing: 2 Helpers (sit to stand)     Chartered loss adjuster assist Assist for toileting: 2 Helpers     Transfers Chair/bed transfer  Transfers assist  Chair/bed transfer activity did not occur: Safety/medical concerns  Chair/bed transfer assist level: Maximal Assistance - Patient 25 - 49% (stand pivot)     Locomotion Ambulation   Ambulation assist   Ambulation activity did not  occur: Refused  Assist level: 2 helpers Assistive device: Other (comment) (R hallway rail) Max distance: 28f   Walk 10 feet activity   Assist  Walk 10 feet activity did not occur: Refused        Walk 50 feet activity   Assist Walk 50 feet with 2 turns activity did not occur: Safety/medical concerns         Walk 150 feet activity   Assist Walk 150 feet activity did not occur: Safety/medical concerns         Walk 10 feet on uneven surface  activity   Assist Walk 10 feet on uneven surfaces activity did not occur: Safety/medical concerns         Wheelchair     Assist Will patient use wheelchair at discharge?: Yes Type of Wheelchair: Manual      Max wheelchair distance: 20 ft     Wheelchair 50 feet with 2 turns activity    Assist        Assist Level: Maximal Assistance - Patient 25 - 49%   Wheelchair 150 feet activity     Assist      Assist Level: Dependent - Patient 0%, Total Assistance - Patient < 25%   Blood pressure 102/62, pulse 84, temperature 97.9 F (36.6 C), temperature source Oral, resp. rate 16, height _0  (1.626 m), weight 59.9 kg, SpO2 100 %.    Medical Problem List and Plan: 1. 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 Formulized individual treatment plan based on medical history, underlying problem and comorbidities.  2.  Antithrombotics: -DVT/anticoagulation:  Mechanical: Sequential compression devices, below knee Bilateral lower extremities             -antiplatelet therapy: N/A 3. Headaches/Pain Management: Oxycodone prn for HA             --monitor for symptoms--may need Topamax. 4. Anxiety: LCSW to follow for evaluation and support. Recommended to continue to minimize use of Ativan due to negative effects of long term use on cognition.             -antipsychotic agents: N/A 5. Neuropsych: This patient is not capable of making decisions on her own behalf. 6. Skin/Wound Care: Routine pressure relief measures. D/c IV after magnesium supplementation as per patient request 7. Fluids/Electrolytes/Nutrition: Monitor I/O. Check CMET in am. 8. HTN: Monitor BP tid--continue Lisinopril and metoprolol. --Poorly controlled -->SBP goal<160 9. Seizure prophylaxis: As per discharge summary, neurology recommended discontinuing Keppra in case it was contributing to hallucinations, but states that  hallucinations were present prior to admission and patient has been tremulous/twitchy raising some concern for seizure. 6/19 to 6/20 showed no evidence of seizures. Consider discontinuing 6/25 one week after ICH if remains seizure free. d/c 6/27 10. Intermittent fevers: Being monitored and have resolved without treatment. 11. Anemia of chronic illness?: Continue to monitor H/H with serial checks--around 9 since 12/2020. 12. Hypomagnesemia: Resume home dose of supplement. IV supplementation on 6/25 13. Hypokalemia: Will add daily supplement.just added KCL  14. 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  Freq BM will reduce senna S to 1 po qhs 16.  Vascular HA add topiramate on 6/24- increased to 58m BID on 6/26- improving  LOS: 6 days A FACE TO FACE EVALUATION WAS PERFORMED  ACharlett Blake6/29/2022, 12:26 PM

## 2021-03-05 NOTE — Progress Notes (Signed)
Occupational Therapy Session Note  Patient Details  Name: Tonya [name] MRN: [id number] Date of Birth: 1946/07/15  Today's Date: 03/05/2021 OT Individual Time: 0902-1002 OT Individual Time Calculation (min): 60 min    Short Term Goals: Week 1:  OT Short Term Goal 1 (Week 1): Pt will complete UB dressing with min A and use of hemi dressing techs prn to increase overall ADL independence. OT Short Term Goal 2 (Week 1): Pt will complete LB dressing with mod A and AE prn. OT Short Term Goal 3 (Week 1): Pt will complete 3/3 toileting tasks on commode with mod A. OT Short Term Goal 4 (Week 1): Pt will complete functional transfers to all appropriate surfaces with LRAD with min A.  Skilled Therapeutic Interventions/Progress Updates:    Pt in bed to start session with agreement to work on selfcare tasks sit to stand at the sink.  Max assist for supine to sit EOB with mod assist for initial sitting balance progressing to min guard statically.  She was able to complete stand pivot transfer to the wheelchair at max assist.  Worked on bathing at the sink with max instructional cueing to scan left of midline to the locate items on the left of the sink.  Max demonstrational cueing with max assist for integrated use of the LUE for squeezing out washcloth and washing the right arm.  She was able to complete UB bathing with mod assist as well as max assist for donning a pullover shirt.  Total +2 (pt 30%) for sit to stand with removal of brief and pants as well as for standing when completing peri care and donning new clothing.  Therapist assisted with completion of donning brief over feet and she was able to assist with donning her pants over the left foot when crossing it and having the left pants leg gathered up, both with max assist.  Secondary to decreased time, therapist and tech assisted with donning the socks and shoes.  Finished session with pt resting in the wheelchair with the call button and  phone in reach and nursing in to give medications for headache and back pain.  Safety belt in place with the call button and phone in reach.    Therapy Documentation Precautions:  Precautions Precautions: Fall Precaution Comments: L hemipareisis, L inattention, mild pushing to L, L field of vision cut Restrictions Weight Bearing Restrictions: No  Pain: Pain Assessment Pain Scale: 0-10 Pain Score: 6  Pain Type: Acute pain Pain Location: Back Pain Orientation: Lower Pain Descriptors / Indicators: Discomfort Pain Intervention(s): Medication (See eMAR) ADL: See Care Tool Section for some details of mobility and selfcare   Therapy/Group: Individual Therapy  Kirin Pastorino OTR/L 03/05/2021, 10:37 AM

## 2021-03-05 NOTE — Progress Notes (Signed)
Nutrition Follow-up  DOCUMENTATION CODES:   Not applicable  INTERVENTION:  Provide Ensure Enlive po QID, each supplement provides 350 kcal and 20 grams of protein  Provide 30 ml Prosource plus po once daily, each supplement provides 100 kcal and 15 grams of protein.   Encourage adequate PO intake.   NUTRITION DIAGNOSIS:   Increased nutrient needs related to  (therapy) as evidenced by estimated needs; ongoing  GOAL:   Patient will meet greater than or equal to 90% of their needs; progressing  MONITOR:   PO intake, Supplement acceptance, Skin, Weight trends, Labs, I & O's  REASON FOR ASSESSMENT:   Malnutrition Screening Tool    ASSESSMENT:   75 year old female with history of Lung cancer s/p RUL lobectomy with ongoing chemo and bradycardia presenting with reports of HA followed by confusion. CT head showed large intraparenchymal hemorrhage within right parietal lobe as well as mild right frontal subarachnoid hemorrhage and patient had tremulousness with concerns of seizure activity. MRI/MRA brain done showing stable appearance of right parietal hemorrhage without underlying mass or vascular malformation, subdural and subarachnoid hemorrhage and small amount of blood within the ventricles without evidence of stenosis, aneurysm or occlusion. EEG done showing cortical dysfunction right hemisphere secondary to underlying stroke and severe diffuse encephalopathy. Bleed was likely hypertensive in nature but to repeat MRI brain in 2 to 3 months due to unclear etiology of bleed. CIR was recommended due to functional decline.  Meal completion has been 10-25%. Family at bedside reports pt with decreased appetite and has been encouraging pt po intake at meals. Pt currently has Ensure ordered and has been consuming them. Family requesting increased in Ensure order to aid in nutrition. RD to increase Ensure to QID. Pt with dislike of Juven and has not been consuming them. RD to discontinue Juven  and instead order Prosource plus. Family additionally requesting fresh fruit with meals. RD to order.   Unable to complete Nutrition-Focused physical exam at this time.  Pt asleep during time of visit. Family reports pt exhausted after therapy today.   Labs and medications reviewed.   Diet Order:   Diet Order             Diet heart healthy/carb modified Room service appropriate? Yes with Assist; Fluid consistency: Thin  Diet effective now                   EDUCATION NEEDS:   Not appropriate for education at this time  Skin:  Skin Assessment: Reviewed RN Assessment  Last BM:  6/26  Height:   Ht Readings from Last 1 Encounters:  02/27/21 5\' 4"  (1.626 m)    Weight:   Wt Readings from Last 1 Encounters:  03/01/21 59.9 kg   BMI:  Body mass index is 22.67 kg/m.  Estimated Nutritional Needs:   Kcal:  1850-2050  Protein:  85-100 grams  Fluid:  >/= 1.8 L/day  Corrin Parker, MS, RD, LDN RD pager number/after hours weekend pager number on Amion.

## 2021-03-05 NOTE — Progress Notes (Signed)
Patient ID: Tonya [name], female   DOB: Feb 05, 1946, 75 y.o.   MRN: [id number] Team Conference Report to Patient/Family  Team Conference discussion was reviewed with the patient and caregiver, including goals, any changes in plan of care and target discharge date.  Patient and caregiver express understanding and are in agreement.  The patient has a target discharge date of 03/25/21.  Sw met with pt, spouse and daughter in room Provided conference updates. Dtr reports pt likes salads and was eating those well with vinaigrette dressing before admission. Pt daughter would also like pt and spouse to move with her in her [name]. Family has not made final determination, will keep sw updated. Pt will have steps at both locations, but daughter reports there will be more assistance for them in [name].  Ramp resources provided to pt daughter and spouse.  Sw will continue to follow up Dyanne Iha 03/05/2021, 2:14 PM

## 2021-03-05 NOTE — Patient Care Conference (Signed)
Inpatient RehabilitationTeam Conference and Plan of Care Update Date: 03/05/2021   Time: 10:30 AM    Patient Name: Tonya [name]      Medical Record Number: [id number]  Date of Birth: Jan 13, 1946 Sex: Female         Room/Bed: 4W08C/4W08C-01 Payor Info: Payor: [employer] MEDICARE / Plan: [place] HMO / Product Type: *No Product type* /    Admit Date/Time:  02/27/2021  4:16 PM  Primary Diagnosis:  Intraparenchymal hematoma of brain [facility])  Hospital Problems: Principal Problem:   Intraparenchymal hematoma of brain [facility])    Expected Discharge Date: Expected Discharge Date: 03/25/21  Team Members Present: Physician leading conference: Dr. Alysia Penna Care Coodinator Present: Dorien Chihuahua, RN, BSN, CRRN;Christina [place], [place] Nurse Present: Dorien Chihuahua, RN PT Present: Page Spiro, PT OT Present: Clyda Greener, OT SLP Present: Charolett Bumpers, SLP PPS Coordinator present : Gunnar Fusi, SLP     Current Status/Progress Goal Weekly Team Focus  Bowel/Bladder   LBM 6/26, contin. of bowel/bladder  remain contin.  assess q shift/PRN   Swallow/Nutrition/ Hydration   Patient tolerating regular diet and thin liquids. Exhibits poor intake.  Regular diet, Mod I  No needs from a swallowing standpoint. Patient is tolerating regular diet/thin. Poor intake.   ADL's   Max assist for UB selfcare with total +2 (pt 30%) for LB selfcare, mod to max for squat pivot transfers simulated to drop arm commode, left neglect, left visual field deficit.  Brunnstrum 3-4 in the arm and hand with max cueing to integrate into therapy.  supervision to contact guard  neuromuscular re-education, balance retraining, transfer training, visual compensation, DME education, cognitive retraining   Mobility   max/total assist bed mobility, max assist squat pivot transfers, heavy max assist of 1 for sit<>stands, gait up to 8ft at R hallway rail with max assist of 1 and +2 w/c follow - functional  mobility impacted by L hemiplegia, L inattention, impaired awareness, impaired motor planning  CGA/min assist overall at limited ambulatory level with supervision w/c mobility  activity tolerance, pt/family education, L attention, dynamic sitting and standing balance, L LE NMR, transfer training, bed mobility training, gait training   Communication             Safety/Cognition/ Behavioral Observations  Mod-to-max  Min A  sustained attention, basic problem solving, short-term recall, safety awareness   Pain   complains of pain 8-10/10 headache, lower back radiating to rt hip  control pain < 3  assess q shift/prn   Skin   intact  skin to remain intact  assess q shift/prn     Discharge Planning:  D/c home with spouse and daughters to provide care, MIN A , 24/7   Team Discussion: Right IPH with visual illusions and perceptual deficits. Repeat CT unremarkable. HTN improved. HA treated per MD. Continue to note right gaze preference, poor awareness, poor sensation and left inattention. Minimal left LE activation noted Patient on target to meet rehab goals: yes, currently mi assist for upper body bathing and max assist for lower body bathing. Total +2 sit to stand. Max assist for stand pivot transfers and ambulation. Supervision - min assist goals set for discharge for OT/PT and min assist for cognition.  *See Care Plan and progress notes for long and short-term goals.   Revisions to Treatment Plan:  Working on attention, basic problem solving recall, error awareness. Regular consistency diet to encourage po intake Teaching Needs: Safety, medications, error awareness tips, secondary risk management, etc  Current Barriers to Discharge: Decreased caregiver support and Home enviroment access/layout  Possible Resolutions to Barriers: Recommend HH follow up Recommend ramp for entry to home     Medical Summary Current Status: repeat CT head no new abnormalities, visual illusions due to CVA, BP  controlled, vascular HAs  Barriers to Discharge: Medical stability   Possible Resolutions to Barriers/Weekly Focus: Work on toileting program   Continued Need for Acute Rehabilitation Level of Care: The patient requires daily medical management by a physician with specialized training in physical medicine and rehabilitation for the following reasons: Direction of a multidisciplinary physical rehabilitation program to maximize functional independence : Yes Medical management of patient stability for increased activity during participation in an intensive rehabilitation regime.: Yes Analysis of laboratory values and/or radiology reports with any subsequent need for medication adjustment and/or medical intervention. : Yes   I attest that I was present, lead the team conference, and concur with the assessment and plan of the team.   Dorien Chihuahua B 03/05/2021, 1:14 PM

## 2021-03-05 NOTE — Progress Notes (Signed)
Physical Therapy Session Note  Patient Details  Name: Tonya [name] MRN: [id number] Date of Birth: 03/30/1946  Today's Date: 03/05/2021 PT Individual Time: 1015-1031 and 1430-1504 PT Individual Time Calculation (min): 16 min  and 34 min and  Today's Date: 03/05/2021 PT Missed Time: 14 Minutes and 26 minutes Missed Time Reason: Nursing care; Meal  Short Term Goals: Week 1:  PT Short Term Goal 1 (Week 1): Pt will improve bed mobility to Mod A overall. PT Short Term Goal 2 (Week 1): Pt will perform STS transfers with Mod A to RW. PT Short Term Goal 3 (Week 1): Pt will perform SPVT transfers with Mod/ Max A using RW and decreased cues for sequencing. PT Short Term Goal 4 (Week 1): Pt will initiate gait training. PT Short Term Goal 5 (Week 1): Pt will initiate w/c mobility training.  Skilled Therapeutic Interventions/Progress Updates:    Session 1: Pt received sitting in w/c with LPN present for medication administration - missed 14 minutes of skilled physical therapy due to pt requiring increased time for medication consumption (pt tends to hold meds and fluids in her mouth for extended periods of time prior to swalowing, SLP aware). Pt agreeable to therapy session. Sit<>stands to/from w/c 2x max assist of 1 focusing on L LE NMR and L UE NMR to push up from w/c - facilitation for L LE WBing and blocking L knee buckle - pt continues to have L lateral hip shift with R lateral trunk lean - cuing and facilitation for improvement. Dynamic standing balance task focusing on reaching superiorly to R with R UE to promote improved trunk/pelvis alignment then transitioning that object into L hand for NMR - requires max assist for standing balance and blocking L knee. Pt's daughter arrived. Pt left sitting in w/c with needs in reach, L UE therapeutically positioned on 1/2 lap tray, seat belt alarm on, and her daughter present.   Session 2: Pt received supported sitting in bed with husband, daughter,  and nursing staff present - pt's meal tray in front of her - family and nursing report pt has not had an opportunity to each lunch, requesting therapist return at a later time. Missed 26 minutes of skilled physical therapy.  Thearpist returned 30 minutes later and pt finished with meal (very minimal consumption noted) and pt agreeable to session. Therapist educated pt's family on bringing in food from home to encourage increased intake. Pt continues to appear to be in a very down/deflated mood. Assisted pt supine>sitting L EOB, total assist for L hemibody management and trunk upright with pt showing decreased initiation and effort towards mobility task. Sitting EOB with +2 assist for trunk control due to posterior lean while donning shoes total assist. Pt continues to appear very down and reserved - therapist encouraged pt to communicate her feelings/emotions with pt stating she is feeling "depressed" - therapist and pt's daughter both providing emotional support and encouragement. Therapist educated pt on positive signs for her recovery including some active movement in L hemibody and ability to initiate gait training this past Saturday with education on importance of continuing to participate in all therapy sessions to maximize her ability to advance independence with mobility. Pt receptive to all emotional support, encouragement, and education; however, remains very deflated and reserved. Therapist suggested a change of scenery, going outside, for emotional support ant pt agreeable. R squat pivot EOB>w/c with total assist of 1 for lifting/pivoting hips, pt requiring increased assist due to decreased initiation and effort towards  mobility - cuing for head/hips relationship and R UE positioning, facilitation required for L LE positioning. Nursing notified and thearpist transported pt outside in w/c - pt's daughter called family members to provide pt with further emotional support and encouragement - 13 minutes of  non-billable time. Transported pt back to room and requesting to return to bed. L squat pivot w/c>EOB with total assist for lifting pivoting hips, continue to require increased assist due to decreased initiation and effort caused by down mood - cuing for head/hips relationship and increased use of LEs. Sit>supine with total assist for trunk descent and B LE management up onto bed. Pt left supine in bed with needs in reach, bed alarm on, L UE therapeutically positioned, and pt's daughter present. Therapist spoke with Jeannene Patella, PA regarding pt's emotional state.   Therapy Documentation Precautions:  Precautions Precautions: Fall Precaution Comments: L hemipareisis, L inattention, mild pushing to L, L field of vision cut Restrictions Weight Bearing Restrictions: No   Pain:  Session 1: No reports of pain throughout session.  Session 2: No reports of pain throughout session.   Therapy/Group: Individual Therapy  Tawana Scale , PT, DPT, CSRS 03/05/2021, 7:58 AM

## 2021-03-05 NOTE — Progress Notes (Signed)
Patient ID: Tonya [name], female   DOB: [birth year], 75 y.o.   MRN: [id number]  Ramp resources provided to pt spouse and daughter  Erlene Quan, [place]

## 2021-03-05 NOTE — Progress Notes (Signed)
Speech Language Pathology Daily Session Note  Patient Details  Name: Tonya [name] MRN: [id number] Date of Birth: 05/12/1946  Today's Date: 03/05/2021 SLP Individual Time: 1300-1345 SLP Individual Time Calculation (min): 45 min  Short Term Goals: Week 1: SLP Short Term Goal 1 (Week 1): Patient will participate in BSE (bedside swallow evaluation) to ensure PO toleration. SLP Short Term Goal 2 (Week 1): Patient will recall and describe specific therapeutic interventions with PT/OT/SLP with modA cues. SLP Short Term Goal 3 (Week 1): Patient will demonstrate sustained attention to basic level problem solving tasks with modA cues. SLP Short Term Goal 4 (Week 1): Patient will demonstrate adequate reasoning and problem solving when completed simulated functional tasks with modA cues. SLP Short Term Goal 5 (Week 1): Patient will demonstrate consistent emergent awareness to deficits and limitations, with modA -cues. SLP Short Term Goal 6 (Week 1): Patient will perform basic-mild complex organizing and/or sequencing tasks with modA cues.  Skilled Therapeutic Interventions: Skilled ST intervention performed with focus on cognitive goals. Patient was accompanied by her daughter Dory Larsen and father during today's session. Patient complained of 10/10 headache which impacted participation and attention to tasks. Nurse was made aware and therapist facilitated low light/stimuli environment. Facilitated verbal reasoning and sustained attention task with Max A verbal and visual (written cues on dry erase board) for attention, working memory, and recall. Patient's sustained attention was limited to 2-3 minute intervals prior to requiring re-direction. Patient would often close her eyes or perseverate on previously discussed topic. ST provided education on patient's deficits and current focus of ST. Patient was left in bed with alarm activated and needs within reach. Continue per ST POC. Pain Pain  Assessment Pain Scale: 0-10 Pain Score: 10-Worst pain ever Faces Pain Scale: Hurts worst Pain Type: Acute pain Pain Location: Head Pain Descriptors / Indicators: Aching Pain Intervention(s): Medication (See eMAR);RN made aware;Other (Comment) (low light environment)  Therapy/Group: Individual Therapy  [name] 03/05/2021, 3:53 PM

## 2021-03-06 MED ORDER — CHLORHEXIDINE GLUCONATE 0.12 % MT SOLN
OROMUCOSAL | Status: AC
  Filled 2021-03-06: qty 15

## 2021-03-06 NOTE — Progress Notes (Signed)
Speech Language Pathology Daily Session Note  Patient Details  Name: Tonya [name] MRN: [id number] Date of Birth: 02-Jun-1946  Today's Date: 03/06/2021 SLP Individual Time: 0900-0945 SLP Individual Time Calculation (min): 45 min  Short Term Goals: Week 1: SLP Short Term Goal 1 (Week 1): Patient will participate in BSE (bedside swallow evaluation) to ensure PO toleration. SLP Short Term Goal 2 (Week 1): Patient will recall and describe specific therapeutic interventions with PT/OT/SLP with modA cues. SLP Short Term Goal 3 (Week 1): Patient will demonstrate sustained attention to basic level problem solving tasks with modA cues. SLP Short Term Goal 4 (Week 1): Patient will demonstrate adequate reasoning and problem solving when completed simulated functional tasks with modA cues. SLP Short Term Goal 5 (Week 1): Patient will demonstrate consistent emergent awareness to deficits and limitations, with modA -cues. SLP Short Term Goal 6 (Week 1): Patient will perform basic-mild complex organizing and/or sequencing tasks with modA cues.  Skilled Therapeutic Interventions: Skilled ST intervention performed with focus on cognition and education with patient's spouse. Patient was received sitting in wheelchair and eating a fruit cup. Spouse remains concerned about patient's intake. SLP facilitated conversation about some of patient's favorite foods/restaurants with plans for spouse to pick up some seafood from patient's favorite restaurant in town for lunch today. Patient was more alert and interactive with therapist throughout treatment. She was able to sustain attention to basic cognitive tasks (familiar card game) for approximately 10 minute duration within quiet environment with Max A verbal and visual cues for attention, problem solving, and processing. Patient required Max-to-total A for tasks lasting beyond 10 minutes and benefited from short breaks (5 minutes) of casual conversation and  quietness prior to continuation of cognitive activities. SLP provided education to spouse regarding patient's current cognitive deficits and beneficial considerations and strategies including 1. Eliminating environmental distractions, 2. Providing additional processing time, 3. Cueing to re-direct patient.     Pain Pain Assessment Pain Scale: 0-10 Pain Score: 0-No pain  Therapy/Group: Individual Therapy  [name] 03/06/2021, 12:23 PM

## 2021-03-06 NOTE — Progress Notes (Signed)
Physical Therapy Session Note  Patient Details  Name: Tonya [name] MRN: [id number] Date of Birth: Nov 22, 1945  Today's Date: 03/06/2021 PT Individual Time: 1110-1208 and [id number] PT Individual Time Calculation (min): 58 min  and 43 min   Short Term Goals: Week 1:  PT Short Term Goal 1 (Week 1): Pt will improve bed mobility to Mod A overall. PT Short Term Goal 2 (Week 1): Pt will perform STS transfers with Mod A to RW. PT Short Term Goal 3 (Week 1): Pt will perform SPVT transfers with Mod/ Max A using RW and decreased cues for sequencing. PT Short Term Goal 4 (Week 1): Pt will initiate gait training. PT Short Term Goal 5 (Week 1): Pt will initiate w/c mobility training.  Skilled Therapeutic Interventions/Progress Updates:    Session 1: Pt received sitting in w/c and agreeable to therapy session. Pt continues to appear down/deflated, though improved some compared to yesterday. Transported to/from gym in w/c for time management and energy conservation. Pt continues to demo very slow, delayed initiation of movement requiring increased time and encouragement to improve activation. Donned L LE ankle DF assist ACE wrap. Sit>stand w/c>R UE support on hallway rail with max assist of 1 for lifting to stand, balance, and blocking L knee buckle - cuing for improved upright posture with increased trunk/hip extension and midline orientation as pt leans trunk towards R - progressed to dynamic standing balance task of reaching towards external target.    Gait training for L LE NMR as follows:  - 59ft at R hallway rail with max assist of 1 and +2 w/c follow, pt demos slight L LE activation to initiate swing but requires max/total assist to advance and position, requires max/total assist to block L knee buckle during stance with pt demoing slight increased activation compared to previous gait with this therapist; pt noted to have excessive R lateral trunk lean due to railing being too low - 25ft x2  with 3 Musketeer support to promote improved upright trunk posture in midline, donned L shoe toe cap to promote more independence with advancement during swing with minimal improvement noted but may be due to fatigue Pt requires prolonged seated rest breaks after ambulation stating,  "I need to catch my breath."  Vitals assessed:  1st walk: BP 109/61 (MAP 73), HR 80bpm, pt reported slight lightheadedness after this walk  2nd walk: BP 110/49 (MAP 68), HR 87bpm, SpO2 100%   Transported back to room and pt requesting to return to bed. L squat pivot w/c>EOB heavy max assist for lifting/pivoting hips - pt holding R hand on w/c armrest preventing therapist facilitation to move hips towards bed on L despite cuing to move hand placement. Sit>supine max assist of 1 for trunk descent and L LE management onto bed - requires cuing again to orient to bed positioning and not lie head down towards foot of bed. Pt left supine in bed, HOB elevated, with needs in reach and bed alarm on.  Session 2: Pt received supine in bed with family present and pt agreeable to therapy session with encouragement. Supine>sitting L EOB, HOB partially elevated and using bedrail, max assist for L LE management and trunk upright - cuing for sequencing and initiation with pt continuing to demo slow, delayed movements. Sitting EOB with R UE support and close supervision for sitting balance while donning shoes max assist. R squat pivot EOB>w/c max assist for lifting/pivoting hips - cuing for sequencing and manual facilitation for L LE management/positioning - pt  continues to have increased sensitivity on L knee when therapist facilitating [place] during transfer  Transported to/from gym in w/c for time management and energy conservation.   Block practice squat pivot transfers w/c<>EOM focusing on sequencing, initiation, and increased pt independence  - R towards mat with pt demoing compensatory strategies relying heavily on R UE and R LE to scoot  with improper head/hips relationship but is able to perform safely with only light mod assist - L towards w/c with pt requiring increased assist in this direction due to more impaired motor planning and inability to compensate with R hemibody - continues to require manual facilitation for L LE positioning during transfer and cuing for head/hips relationship  Sitting EOM focusing on trunk control/sitting balance during L UE NMR task of reaching short distance to grasp and move foam lego blocks - requires significantly increased time and encouragement to initiate movement of L hand and only able to move 3 lego blocks prior to pt becoming fatigued and reclining back on R forearm support to rest. Therapist attempted to cue pt to rest on L forearm to promote L UE NMR but pt not able to tolerate due to fatigue. Supported sitting with focus on increased trunk extension and scapular retraction stretching.  Therapist swapped out pt's wheelchair with one that had contoured back support due to pt consistently demoing R lateral trunk lean in sitting as pt leaning over onto that arm for support. At end of session pt left sitting in w/c with needs in reach, seat belt alarm on, and family present.  Therapy Documentation Precautions:  Precautions Precautions: Fall Precaution Comments: L hemipareisis, L inattention, mild pushing to L, L field of vision cut Restrictions Weight Bearing Restrictions: No   Pain: Session 1: Continues to have increased sensitivity in L hemibody - repositioned facilitation positioning for pain management.  Session 2: Continues to report migraine headache, unrated, nursing aware.   Therapy/Group: Individual Therapy  Tawana Scale , PT, DPT, CSRS 03/06/2021, 7:58 AM

## 2021-03-06 NOTE — Progress Notes (Signed)
PROGRESS NOTE   Subjective/Complaints: Seen in gym practicing reaching , from sitting position , occipital HA  ROS- neg CP, SOB, N/V, +migraine Objective:   No results found. No results for input(s): WBC, HGB, HCT, PLT in the last 72 hours.  No results for input(s): NA, K, CL, CO2, GLUCOSE, BUN, CREATININE, CALCIUM in the last 72 hours.   Intake/Output Summary (Last 24 hours) at 03/06/2021 0903 Last data filed at 03/05/2021 2000 Gross per 24 hour  Intake 100 ml  Output --  Net 100 ml         Physical Exam: Vital Signs Blood pressure 100/62, pulse 75, temperature 98.5 F (36.9 C), resp. rate 18, height 5\' 4"  (1.626 m), weight 59.9 kg, SpO2 98 %.  General: No acute distress Mood and affect are appropriate Heart: Regular rate and rhythm no rubs murmurs or extra sounds Lungs: Clear to auscultation, breathing unlabored, no rales or wheezes Abdomen: Positive bowel sounds, soft nontender to palpation, nondistended  Extremities: No clubbing, cyanosis, or edema Skin: No evidence of breakdown, no evidence of rash Neurologic: Alert and oriented  motor strength is 5/5 in right and 3- Leftdeltoid, bicep, tricep, grip, trace hip flexor, knee extensors, ankle dorsiflexor and plantar flexor Sensory exam normal sensation to light touch RIght and absent left  upper and lower extremities Cerebellar exam unable to perform on left due to weakness Musculoskeletal: Full range of motion in all 4 extremities. No joint swelling, tight in upper traps    Assessment/Plan: 1. Functional deficits which require 3+ hours per day of interdisciplinary therapy in a comprehensive inpatient rehab setting. Physiatrist is providing close team supervision and 24 hour management of active medical problems listed below. Physiatrist and rehab team continue to assess barriers to discharge/monitor patient progress toward functional and medical goals  Care  Tool:  Bathing    Body parts bathed by patient: Chest, Abdomen, Face, Left arm, Right upper leg, Left upper leg   Body parts bathed by helper: Left lower leg, Right lower leg, Front perineal area, Buttocks, Right arm Body parts n/a: Right upper leg, Left upper leg, Buttocks, Front perineal area, Left lower leg, Right lower leg (did not attempt)   Bathing assist Assist Level: 2 Helpers (sit to stand)     Upper Body Dressing/Undressing Upper body dressing   What is the patient wearing?: Pull over shirt    Upper body assist Assist Level: Maximal Assistance - Patient 25 - 49%    Lower Body Dressing/Undressing Lower body dressing      What is the patient wearing?: Pants, Incontinence brief     Lower body assist Assist for lower body dressing: 2 Helpers (sit to stand)     Chartered loss adjuster assist Assist for toileting: 2 Helpers     Transfers Chair/bed transfer  Transfers assist  Chair/bed transfer activity did not occur: Safety/medical concerns  Chair/bed transfer assist level: Total Assistance - Patient < 25% (squat pivot)     Locomotion Ambulation   Ambulation assist   Ambulation activity did not occur: Refused  Assist level: 2 helpers Assistive device: Other (comment) (R hallway rail) Max distance: 74ft   Walk 10 feet  activity   Assist  Walk 10 feet activity did not occur: Refused        Walk 50 feet activity   Assist Walk 50 feet with 2 turns activity did not occur: Safety/medical concerns         Walk 150 feet activity   Assist Walk 150 feet activity did not occur: Safety/medical concerns         Walk 10 feet on uneven surface  activity   Assist Walk 10 feet on uneven surfaces activity did not occur: Safety/medical concerns         Wheelchair     Assist Will patient use wheelchair at discharge?: Yes Type of Wheelchair: Manual      Max wheelchair distance: 20 ft    Wheelchair 50 feet with 2 turns  activity    Assist        Assist Level: Maximal Assistance - Patient 25 - 49%   Wheelchair 150 feet activity     Assist      Assist Level: Dependent - Patient 0%, Total Assistance - Patient < 25%   Blood pressure 100/62, pulse 75, temperature 98.5 F (36.9 C), resp. rate 18, height 5\' 4"  (1.626 m), weight 59.9 kg, SpO2 98 %.    Medical Problem List and Plan: 1. 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  Antithrombotics: -DVT/anticoagulation:  Mechanical: Sequential compression devices, below knee Bilateral lower extremities             -antiplatelet therapy: N/A 3. Headaches/Pain Management: Oxycodone prn for HA             --monitor for symptoms--may need Topamax. 4. Anxiety: LCSW to follow for evaluation and support. Recommended to continue to minimize use of Ativan due to negative effects of long term use on cognition.             -antipsychotic agents: N/A 5. Neuropsych: This patient is not capable of making decisions on her own behalf. 6. Skin/Wound Care: Routine pressure relief measures. D/c IV after magnesium supplementation as per patient request 7. Fluids/Electrolytes/Nutrition: Monitor I/O. Check CMET in am. 8. HTN: Monitor BP tid--continue Lisinopril and metoprolol. --Poorly controlled -->SBP goal<160 9. Seizure prophylaxis: As per discharge summary, neurology recommended discontinuing Keppra in case it was contributing to hallucinations, but states that hallucinations were present prior to admission and patient has been tremulous/twitchy raising some concern for seizure. 6/19 to 6/20 showed no evidence of seizures. Consider discontinuing 6/25 one week after ICH if remains seizure free. d/c 6/27 10. Intermittent fevers: Being monitored and have resolved  without treatment. 11. Anemia of chronic illness?: Continue to monitor H/H with serial checks--around 9 since 12/2020. 12. Hypomagnesemia: Resume home dose of supplement. IV supplementation on 6/25 13. Hypokalemia: Will add daily supplement.just added KCL  14. 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  Freq BM will reduce senna S to 1 po qhs 16.   HA add topiramate on 6/24- increased to 50mg  BID on 6/26- likely combination of vascular HA and  tension type HA , add sportcream to traps as well as Kpad , hesitate with muscle relaxer due to sedation side effects  LOS: 7 days A FACE TO FACE EVALUATION WAS PERFORMED  Charlett Blake 03/06/2021, 9:03 AM

## 2021-03-06 NOTE — Progress Notes (Signed)
Occupational Therapy Session Note  Patient Details  Name: Tonya [name] MRN: [id number] Date of Birth: 23-Jan-1946  Today's Date: 03/06/2021 OT Individual Time: 0802-0900 OT Individual Time Calculation (min): 58 min    Short Term Goals: Week 1:  OT Short Term Goal 1 (Week 1): Pt will complete UB dressing with min A and use of hemi dressing techs prn to increase overall ADL independence. OT Short Term Goal 2 (Week 1): Pt will complete LB dressing with mod A and AE prn. OT Short Term Goal 3 (Week 1): Pt will complete 3/3 toileting tasks on commode with mod A. OT Short Term Goal 4 (Week 1): Pt will complete functional transfers to all appropriate surfaces with LRAD with min A.  Skilled Therapeutic Interventions/Progress Updates:    Pt in bed to start with flat affect, reporting increased pain secondary to headache.  Nursing made aware with meds brought during first part of session.  Completed transfer to sitting with max assist and then worked on donning pants and shoes.  She needed mod assist for dynamic sitting balance EOB secondary to posterior pelvic tilt and posterior lean.  She was able to complete donning pants following hemi techniques with max assist over her LEs with total +2 (pt 30%) for sit to stand.  Increase difficulty perceptually determining the correct leg hole to place her foot in.  She was tearful as well EOB secondary to pain and difficulty of task.  Therapist gave re-assurance that she was doing good and she had to keep pushing to get better.  She needed max assist for threading her left shoe, crossing the LLE over the right knee with min assist for donning the right shoe over her feet.  Total assist was needed for tying them.  She next completed squat pivot transfer to the wheelchair with mod assist to the right.  She was then taken to the therapy gym where she completed transfer again to the mat at the same level.  Focused on functional reaching in sitting with visual  scanning to the right to place playing cards on the mirror.  Mod instructional cueing at times to scan further to the left to locate the match.  LUE placed in weightbearing while reaching for cards with the left with emphasis on using the LUE to help maintain sitting balance.  Mod demonstrational cueing to maintain anterior pelvic tilt while in sitting.  Progressed to standing to retrieve and place cards as well with max assist needed secondary to increased pushing to the left side.  She was only able to maintain standing intervals of less than 2 mins before needing rest.  Max assist needed to maintain hip extension as well as for left knee extension.  Finished session with return to the wheelchair at mod assist level squat pivot and transition back to the room.  Call button and phone in reach with safety belt in place in the wheelchair.  Her spouse was present as well to assist with dentures and breakfast.    Therapy Documentation Precautions:  Precautions Precautions: Fall Precaution Comments: L hemipareisis, L inattention, mild pushing to L, L field of vision cut Restrictions Weight Bearing Restrictions: No   Pain: Pain Assessment Pain Scale: 0-10 Pain Score: 5  Pain Type: Acute pain Pain Location: Head Pain Descriptors / Indicators: Headache Pain Frequency: Constant Pain Onset: On-going Pain Intervention(s): Medication (See eMAR) ADL: See Care Tool Section for some details of mobility  Therapy/Group: Individual Therapy  Tonya [name] OTR/L 03/06/2021, 4:11 PM

## 2021-03-06 NOTE — Progress Notes (Signed)
Physical Therapy Weekly Progress Note  Patient Details  Name: Tonya [name] MRN: [id number] Date of Birth: September 11, 1945  Beginning of progress report period: February 28, 2021 End of progress report period: March 06, 2021  Patient has met 1 of 5 short term goals.  Ms. Salvador demonstrates slower than anticipated progress with therapy that has been impacted by feelings of decreased mood limiting some participation with therapy. She demonstrates L hemiplegia, L inattention, impaired motor planning, delayed initiation, impaired awareness, and possible pushing tendencies in standing that are impacting her functional mobility level. She is performing supine<>sit with max/total assist, sit<>stand with max/total assist of 1, squat pivot transfers with max/total assist of 1, and ambulating up to 16ft using 3 Musketeer support with +2 max assist. She requires total assist for L LE management during swing and stance phase of gait. Ms. Beining will benefit from continued CIR level therapist to further progress independence with functional mobility and decrease caregiver burden prior to D/C home with 24hr support from family.   Patient continues to demonstrate the following deficits muscle weakness, muscle joint tightness, and muscle paralysis, decreased cardiorespiratoy endurance, impaired timing and sequencing, abnormal tone, unbalanced muscle activation, motor apraxia, decreased coordination, and decreased motor planning, decreased visual perceptual skills, decreased visual motor skills, and field cut, decreased midline orientation and decreased attention to left, decreased initiation, decreased attention, decreased awareness, decreased problem solving, decreased safety awareness, decreased memory, and delayed processing, peripheral, and decreased sitting balance, decreased standing balance, decreased postural control, hemiplegia, and decreased balance strategies and therefore will continue to benefit  from skilled PT intervention to increase functional independence with mobility.  Patient progressing toward long term goals..  Continue plan of care.  PT Short Term Goals Week 1:  PT Short Term Goal 1 (Week 1): Pt will improve bed mobility to Mod A overall. PT Short Term Goal 1 - Progress (Week 1): Progressing toward goal PT Short Term Goal 2 (Week 1): Pt will perform STS transfers with Mod A to RW. PT Short Term Goal 2 - Progress (Week 1): Progressing toward goal PT Short Term Goal 3 (Week 1): Pt will perform SPVT transfers with Mod/ Max A using RW and decreased cues for sequencing. PT Short Term Goal 3 - Progress (Week 1): Progressing toward goal PT Short Term Goal 4 (Week 1): Pt will initiate gait training. PT Short Term Goal 4 - Progress (Week 1): Met PT Short Term Goal 5 (Week 1): Pt will initiate w/c mobility training. PT Short Term Goal 5 - Progress (Week 1): Progressing toward goal Week 2:  PT Short Term Goal 1 (Week 2): Pt will perform supine<>sit with mod assist PT Short Term Goal 2 (Week 2): Pt  will perform sit<>stands using LRAD with mod assist PT Short Term Goal 3 (Week 2): Pt will perform bed<>chair transfers using LRAD with mod assist PT Short Term Goal 4 (Week 2): Pt will ambulate at least 61ft using LRAD with +2 mod assist  Skilled Therapeutic Interventions/Progress Updates:  Ambulation/gait training;Balance/vestibular training;Cognitive remediation/compensation;Community reintegration;Discharge planning;Disease management/prevention;DME/adaptive equipment instruction;Functional electrical stimulation;Functional mobility training;Neuromuscular re-education;Pain management;Patient/family education;Psychosocial support;Splinting/orthotics;Stair training;Therapeutic Activities;Therapeutic Exercise;UE/LE Strength taining/ROM;UE/LE Coordination activities;Visual/perceptual remediation/compensation;Wheelchair propulsion/positioning;Skin care/wound management   Therapy  Documentation Precautions:  Precautions Precautions: Fall Precaution Comments: L hemipareisis, L inattention, mild pushing to L, L field of vision cut Restrictions Weight Bearing Restrictions: No  Tawana Scale , PT, DPT, CSRS 03/06/2021, 3:30 PM

## 2021-03-07 ENCOUNTER — Other Ambulatory Visit: Payer: Self-pay | Admitting: Internal Medicine

## 2021-03-07 MED ORDER — TOPIRAMATE 25 MG PO TABS
75.0000 mg | ORAL_TABLET | Freq: Two times a day (BID) | ORAL | Status: DC
  Administered 2021-03-07 – 2021-03-10 (×7): 75 mg via ORAL
  Filled 2021-03-07 (×7): qty 3

## 2021-03-07 MED ORDER — TROLAMINE SALICYLATE 10 % EX CREA: TOPICAL_CREAM | Freq: Two times a day (BID) | CUTANEOUS | Status: DC | PRN

## 2021-03-07 MED ORDER — MUSCLE RUB 10-15 % EX CREA
TOPICAL_CREAM | Freq: Two times a day (BID) | CUTANEOUS | Status: DC | PRN
  Filled 2021-03-07: qty 85

## 2021-03-07 MED ORDER — PROSOURCE PLUS PO LIQD
30.0000 mL | Freq: Four times a day (QID) | ORAL | Status: DC
  Administered 2021-03-07 – 2021-03-25 (×70): 30 mL via ORAL
  Filled 2021-03-07 (×65): qty 30

## 2021-03-07 NOTE — Progress Notes (Signed)
Nutrition Follow-up  DOCUMENTATION CODES:   Not applicable  INTERVENTION:  48 hour caloric count initiated.  Provide Ensure Enlive po QID, each supplement provides 350 kcal and 20 grams of protein.  Provide 30 ml Prosource plus po QID, each supplement provides 100 kcal and 15 grams of protein.   Encourage adequate PO intake.   NUTRITION DIAGNOSIS:   Increased nutrient needs related to  (therapy) as evidenced by estimated needs; ongoing  GOAL:   Patient will meet greater than or equal to 90% of their needs; not met  MONITOR:   PO intake, Supplement acceptance, Skin, Weight trends, Labs, I & O's  REASON FOR ASSESSMENT:   Malnutrition Screening Tool    ASSESSMENT:   75 year old female with history of Lung cancer s/p RUL lobectomy with ongoing chemo and bradycardia presenting with reports of HA followed by confusion. CT head showed large intraparenchymal hemorrhage within right parietal lobe as well as mild right frontal subarachnoid hemorrhage and patient had tremulousness with concerns of seizure activity. MRI/MRA brain done showing stable appearance of right parietal hemorrhage without underlying mass or vascular malformation, subdural and subarachnoid hemorrhage and small amount of blood within the ventricles without evidence of stenosis, aneurysm or occlusion. EEG done showing cortical dysfunction right hemisphere secondary to underlying stroke and severe diffuse encephalopathy. Bleed was likely hypertensive in nature but to repeat MRI brain in 2 to 3 months due to unclear etiology of bleed. CIR was recommended due to functional decline.  Meal completion 25%. RN reports pt with poor po over the past 4 days. Pt currently has Ensure ordered and has been only consuming a couple of sips. RN does reports pt does improve in PO when family/husband is around for encouragement. Family to continue to encouraged PO. Pt additionally reports headache and nausea. RN to provide medication.  Calorie count ordered via MD. RD to follow up early next week for full calorie count results. RD to increase prosource plus to aid in caloric and protein needs. If po intake does not improve over the weekend, may need consideration of alternative means of nutrition.   NUTRITION - FOCUSED PHYSICAL EXAM:  Flowsheet Row Most Recent Value  Orbital Region Unable to assess  Upper Arm Region No depletion  Thoracic and Lumbar Region No depletion  Buccal Region Unable to assess  Temple Region Unable to assess  Clavicle and Acromion Bone Region Moderate depletion  Scapular Bone Region Unable to assess  Dorsal Hand Unable to assess  Patellar Region Moderate depletion  Anterior Thigh Region Moderate depletion  Posterior Calf Region Moderate depletion  Edema (RD Assessment) None  Hair Reviewed  Eyes Reviewed  Mouth Reviewed  Skin Reviewed  Nails Reviewed      Labs and medications reviewed.   Diet Order:   Diet Order             Diet heart healthy/carb modified Room service appropriate? Yes with Assist; Fluid consistency: Thin  Diet effective now                   EDUCATION NEEDS:   Not appropriate for education at this time  Skin:  Skin Assessment: Reviewed RN Assessment  Last BM:  7/1  Height:   Ht Readings from Last 1 Encounters:  02/27/21 5\' 4"  (1.626 m)    Weight:   Wt Readings from Last 1 Encounters:  03/06/21 58.1 kg    BMI:  Body mass index is 21.99 kg/m.  Estimated Nutritional Needs:  Kcal:  1850-2050  Protein:  85-100 grams  Fluid:  >/= 1.8 L/day  Corrin Parker, MS, RD, LDN RD pager number/after hours weekend pager number on Amion.

## 2021-03-07 NOTE — Progress Notes (Signed)
Occupational Therapy Session Note  Patient Details  Name: Tonya [name] MRN: [id number] Date of Birth: October 31, 1945  Today's Date: 03/07/2021 OT Missed Time: 7 Minutes Missed Time Reason: Other (comment);Pain (migraine)  Pt c/o HA and politely declines tx as music will exacerbate migraine. Will follow up per availability.  [name] 03/07/2021, 6:57 AM

## 2021-03-07 NOTE — Progress Notes (Addendum)
Speech Language Pathology Weekly Progress and Session Note  Patient Details  Name: Tonya [name] MRN: [id number] Date of Birth: 03-13-1946  Beginning of progress report period: February 28, 2021 End of progress report period: March 07, 2021  Today's Date: 03/07/2021 SLP Individual Time: 1400-1500 SLP Individual Time Calculation (min): 60 min  Short Term Goals: Week 1: SLP Short Term Goal 1 (Week 1): Patient will participate in BSE (bedside swallow evaluation) to ensure PO toleration. SLP Short Term Goal 1 - Progress (Week 1): Met SLP Short Term Goal 2 (Week 1): Patient will recall and describe specific therapeutic interventions with PT/OT/SLP with modA cues. SLP Short Term Goal 2 - Progress (Week 1): Not met SLP Short Term Goal 3 (Week 1): Patient will demonstrate sustained attention to basic level problem solving tasks with modA cues. SLP Short Term Goal 4 (Week 1): Patient will demonstrate adequate reasoning and problem solving when completed simulated functional tasks with modA cues. SLP Short Term Goal 4 - Progress (Week 1): Not met SLP Short Term Goal 5 (Week 1): Patient will demonstrate consistent emergent awareness to deficits and limitations, with modA -cues. SLP Short Term Goal 5 - Progress (Week 1): Not met SLP Short Term Goal 6 (Week 1): Patient will perform basic-mild complex organizing and/or sequencing tasks with modA cues. SLP Short Term Goal 6 - Progress (Week 1): Met    New Short Term Goals: Week 2: SLP Short Term Goal 1 (Week 2): Patient will recall and describe specific therapeutic interventions with PT/OT/SLP with modA cues. SLP Short Term Goal 2 (Week 2): Patient will demonstrate sustained attention to basic level problem solving tasks with modA cues. SLP Short Term Goal 3 (Week 2): Patient will demonstrate adequate reasoning and problem solving when completed simulated functional tasks with modA cues. SLP Short Term Goal 4 (Week 2): Patient will demonstrate  consistent emergent awareness to deficits and limitations, with modA -cues. SLP Short Term Goal 5 (Week 2): Patient will perform basic-mild complex organizing and/or sequencing tasks with min A cues.  Weekly Progress Updates:Patient demonstrates slow progress toward goals and had met 2 of 6 short-term goals this reporting period due. Progress has been limited by pain and alertness, consistently requiring max verbal encouragement to participate.  Patient demonstrates improvement in attention as of most recent sessions, although appears to fatigue quickly. Currently requiring max A verbal and visual cues for attention, problem solving, and processing. Able to attend to tasks for approximately 10-minute intervals and benefits from a low-stim environment to maximize attention to tasks. Patient is currently tolerating a regular diet and thin liquids with no overt s/sx of aspiration. Patient continues to exhibit poor PO intake which is unrelated to swallow function. Patient and family education is ongoing. Patient would benefit from continued skilled SLP intervention to maximize cognitive function and in order to maximize functional independence prior to discharge.   Intensity: Minumum of 1-2 x/day, 30 to 90 minutes Frequency: 3 to 5 out of 7 days Duration/Length of Stay: 7/19 Treatment/Interventions: Cognitive remediation/compensation;Dysphagia/aspiration precaution training;Internal/external aids;Medication managment;Environmental controls;Cueing hierarchy;Patient/family education;Functional tasks  Daily Session Skilled Therapeutic Interventions: Skilled ST intervention performed with focus on cognitive goals. SLP facilitated use of iphone per patient request, as she reported having difficulty answering phone and utilizing various functions, specifically locating recent calls and returning missed calls. Patient successfully unlocked her phone with initial Sup A verbal cues by entering passcode.By the end of  session, patient was unable to recall passcode and unable to successfully unlock phone. Husband  arrived during end of session and was able to assist with unlocking patient's phone. Wrote passcode down on paper and placed it on patient's bedside table per patient request. She was able to located home screen (from another screen) x3 with Min A verbal cues, and located recent calls x3 with Max A verbal/visual cues for attention and visual scanning with limited-to-no retention of skill between attempts. Patient displayed fatigue by end of session resulting in significantly limited attention to task. Educated patient's spouse on attention strategies to assist with using phone.Patient was left in wheelchair with alarm activated and needs within reach. Continue per ST POC.   General    Pain Pain Assessment Pain Scale: 0-10 Pain Score: 6  Faces Pain Scale: Hurts little more Pain Type: Acute pain Pain Location: Head Pain Orientation: Posterior Pain Descriptors / Indicators: Headache Pain Frequency: Constant Pain Intervention(s): Medication (See eMAR) 2nd Pain Site Pain Descriptors / Indicators: Aching  Therapy/Group: Individual Therapy  [name] 03/07/2021, 4:47 PM

## 2021-03-07 NOTE — Plan of Care (Signed)
  Problem: RH Swallowing Goal: LTG Patient will consume least restrictive diet using compensatory strategies with assistance (SLP) Description: LTG:  Patient will consume least restrictive diet using compensatory strategies with assistance (SLP) Outcome: Completed/Met Goal: LTG Patient will participate in dysphagia therapy to increase swallow function with assistance (SLP) Description: LTG:  Patient will participate in dysphagia therapy to increase swallow function with assistance (SLP) Outcome: Not Applicable Note: Oropharyngeal swallow functional and tolerating regular diet and thin liquids. Dysphagia exercises are not clinically indicated at this time.

## 2021-03-07 NOTE — Progress Notes (Signed)
Occupational Therapy Weekly Progress Note  Patient Details  Name: Tonya [name] MRN: [id number] Date of Birth: Aug 02, 1946  Beginning of progress report period: March 01, 2021 End of progress report period: March 07, 2021  Today's Date: 03/07/2021 OT Individual Time: 1340-1405 OT Individual Time Calculation (min): 25 min    Patient has met 0 of 5 short term goals.  Pt is making slow but steady progress with OT at this time.  She continues to need total +2 (pt 30%) for LB selfcare sit to stand with min assist for UB bathing and max assist for UB dressing.  LUE hemiparesis with decreased sensation is present with movement currently at a Brunnstrum stage IV level in the arm and hand.  She needs constant visual input for use of the hand functionally secondary to impaired proprioception.  Mod assist is needed for integration into bathing tasks at this time.  [name] continues with left neglect and left visual field deficits as well as motor planning deficits.  She is able to complete squat pivot transfers with overall mod assist, but continues to need total assist for stand pivots secondary to increased pushing to the left.  Decreased overall endurance is noted for all functional tasks as well with increased head and upper trap pain noted ongoing, which causes internal distraction and decreased sustained attention to selfcare tasks.  Recommend continued CIR level therapy to progress to at least a min to mod assist level for return home with her spouse.  Current goals are at supervision level and feel she will not meet these within a reasonable time.  Will downgrade as appropriate.  Patient continues to demonstrate the following deficits: decreased cardiorespiratoy endurance, unbalanced muscle activation, motor apraxia, decreased coordination, and decreased motor planning, decreased visual perceptual skills and field cut, decreased attention to left and left side neglect, decreased attention,  decreased awareness, decreased problem solving, decreased safety awareness, decreased memory, and delayed processing, and decreased sitting balance, decreased standing balance, decreased postural control, hemiplegia, and decreased balance strategies and therefore will continue to benefit from skilled OT intervention to enhance overall performance with BADL and Reduce care partner burden.  Patient not progressing toward long term goals.  See goal revision..  Continue plan of care.  OT Short Term Goals Week 2:  OT Short Term Goal 1 (Week 2): Pt will locate washcloth/soap on the left of the sink with no more than mod questioning cueing. OT Short Term Goal 2 (Week 2): Pt will complete UB dressing with mod assist following hemi techniques. OT Short Term Goal 3 (Week 2): Pt will complete LB bathing with mod assist sit to stand. OT Short Term Goal 4 (Week 2): Pt will donn pull up pants sit to stand with mod assist. OT Short Term Goal 5 (Week 2): Pt will complete squat pivot transfer to the drop arm commode with mod assist.  Skilled Therapeutic Interventions/Progress Updates:    Pt up in wheelchair to start session.  Worked on LUE neuromuscular re-education and active reach from the wheelchair.  Min assist was needed for scooting back in the wheelchair on the left side for improved posture.  She was able to reach to the bedside table placed in front of her to pick up pieces of crumbled up paper with mod facilitation and mod demonstrational cueing to maintain sustained attention to her UE as she completed reach.  She would demonstrate decreased sustained attention at times, placing her right hand on her head and closing her eyes secondary  to headache pain.  When asked if she had something already for pain, she was unable to determine.  Educated her that she could notify nursing for pain meds if needed.  When going back to the task, she needed mod instructional cueing to scan the entire table including the left  side for locating the correct number of pieces of paper left to locate.  Finished session with pt sitting up in the wheelchair with safety belt in place and with SLP in for next session.    Therapy Documentation Precautions:  Precautions Precautions: Fall Precaution Comments: L hemipareisis, L inattention, mild pushing to L, L field of vision cut Restrictions Weight Bearing Restrictions: No  Pain: Pain Assessment Pain Scale: 0-10 Pain Score: 6  Faces Pain Scale: Hurts little more Pain Type: Acute pain Pain Location: Head Pain Orientation: Posterior Pain Descriptors / Indicators: Headache Pain Frequency: Constant Pain Intervention(s): Medication (See eMAR) 2nd Pain Site Pain Descriptors / Indicators: Aching ADL: See Care Tool Section for some details of mobility and selfcare   Therapy/Group: Individual Therapy  Brianah Hopson OTR/L 03/07/2021, 4:12 PM

## 2021-03-07 NOTE — Progress Notes (Signed)
Physical Therapy Session Note  Patient Details  Name: Tonya [name] MRN: [id number] Date of Birth: August 01, 1946  Today's Date: 03/07/2021 PT Individual Time: 0867-6195 PT Individual Time Calculation (min): 76 min   Short Term Goals: Week 1:  PT Short Term Goal 1 (Week 1): Pt will improve bed mobility to Mod A overall. PT Short Term Goal 1 - Progress (Week 1): Progressing toward goal PT Short Term Goal 2 (Week 1): Pt will perform STS transfers with Mod A to RW. PT Short Term Goal 2 - Progress (Week 1): Progressing toward goal PT Short Term Goal 3 (Week 1): Pt will perform SPVT transfers with Mod/ Max A using RW and decreased cues for sequencing. PT Short Term Goal 3 - Progress (Week 1): Progressing toward goal PT Short Term Goal 4 (Week 1): Pt will initiate gait training. PT Short Term Goal 4 - Progress (Week 1): Met PT Short Term Goal 5 (Week 1): Pt will initiate w/c mobility training. PT Short Term Goal 5 - Progress (Week 1): Progressing toward goal Week 2:  PT Short Term Goal 1 (Week 2): Pt will perform supine<>sit with mod assist PT Short Term Goal 2 (Week 2): Pt  will perform sit<>stands using LRAD with mod assist PT Short Term Goal 3 (Week 2): Pt will perform bed<>chair transfers using LRAD with mod assist PT Short Term Goal 4 (Week 2): Pt will ambulate at least 69ft using LRAD with +2 mod assist  Skilled Therapeutic Interventions/Progress Updates:  Patient supine in bed on entrance to room. Husband present. Patient initially asleep and difficult to rouse. Then pt wakes and requires time to orient. MD present for morning rounds. Pt relates continued headache pain. MD addressing with adjustment in dosage of headache medicine.   Therapeutic Activity: Bed Mobility: Patient performed supine --> sit with heavy cueing for initiation of roll to L. Able to bring RLE off bed and requires Mod A to complete LLE from bed surface. Is able to pull at therapist's hand for initiating pull of  UB to sitting but with reposition of R hand on bedrail, is unable to use either UE to push from bed surface and requires mod/ Max A to reach sitting position on EOB.  VC/ tc required for initiation, technique, effort. Sitting balance improving and requires overall sup/ CGA and up to Min A to maintain static sitting balance EOB to don shoes - Max A for LLE and Mod A for RLE. Transfers: Patient performed squat pivot transfer from bed --> w/c with Min A. Pt able to scoot self along EOB to position next to w/c with CGA. Heavy cueing for UE positioning on armrest of w/c and pt demos good forward lean and Min A to reach w/c in one effort. Able to reposition self for improved positioning.   STS transfers to hallway HR x3 performed with Max A and focus on midline power up through BLE requiring block to L knee and vc/ tc for upright posture.   Gait Training:  Gait training attempted x3 with pt ambulating a total of 6 feet. Pt relates fatigue and headache affecting ability to complete ambulation. Demonstrated flexed forward posture with increased lean to L requiring Max A for corrective lean to R.   Neuromuscular Re-ed: NMR facilitated during session with focus on STS and standing balance/ upright posture. Pt guided in rise to stand in parallel bars using Airex pad for L knee block. Requires extensive cueing to initiate and reach forward with RUE. Min A for  LUE placement and hold. Heavy lean to L and pt cued to to shift hips to touch bar on R <> midline.  NMR performed for improvements in motor control and coordination, balance, sequencing, judgement, and self confidence/ efficacy in performing all aspects of mobility at highest level of independence.   Patient seated upright in w/c at end of session with brakes locked, belt alarm set, LUE propped on pillow and tray table with breakfast tray in front of pt with all needs within reach. Husband assisting with breakfast feeding. Relates that she has always been a slow  eater.     Therapy Documentation Precautions:  Precautions Precautions: Fall Precaution Comments: L hemipareisis, L inattention, mild pushing to L, L field of vision cut Restrictions Weight Bearing Restrictions: No  Therapy/Group: Individual Therapy  Alger Simons PT, DPT 03/07/2021, 5:21 PM

## 2021-03-07 NOTE — Plan of Care (Signed)
  Problem: Sit to Stand Goal: LTG:  Patient will perform sit to stand in prep for activites of daily living with assistance level (OT) Description: LTG:  Patient will perform sit to stand in prep for activites of daily living with assistance level (OT) Flowsheets (Taken 03/07/2021 1646) LTG: PT will perform sit to stand in prep for activites of daily living with assistance level: (Goals downgraded based on slower progress than originally expected.) Minimal Assistance - Patient > 75% Note: Goals downgraded based on slower progress than originally expected   Problem: RH Bathing Goal: LTG Patient will bathe all body parts with assist levels (OT) Description: LTG: Patient will bathe all body parts with assist levels (OT) Flowsheets (Taken 03/07/2021 1646) LTG: Pt will perform bathing with assistance level/cueing: (Goals downgraded based on slower progress than originally expected) Minimal Assistance - Patient > 75% LTG: Position pt will perform bathing:  Sit to Stand  Shower Note: Goals downgraded based on slower progress than originally expected   Problem: RH Dressing Goal: LTG Patient will perform upper body dressing (OT) Description: LTG Patient will perform upper body dressing with assist, with/without cues (OT). Flowsheets (Taken 03/07/2021 1646) LTG: Pt will perform upper body dressing with assistance level of: (Goals downgraded based on slower progress than originally expected) Minimal Assistance - Patient > 75% Note: Goals downgraded based on slower progress than originally expected Goal: LTG Patient will perform lower body dressing w/assist (OT) Description: LTG: Patient will perform lower body dressing with assist, with/without cues in positioning using equipment (OT) Flowsheets (Taken 03/07/2021 1646) LTG: Pt will perform lower body dressing with assistance level of: (Goals downgraded based on slower progress than originally expected) Moderate Assistance - Patient 50 - 74% Note: Goals  downgraded based on slower progress than originally expected   Problem: RH Toileting Goal: LTG Patient will perform toileting task (3/3 steps) with assistance level (OT) Description: LTG: Patient will perform toileting task (3/3 steps) with assistance level (OT)  Flowsheets (Taken 03/07/2021 1646) LTG: Pt will perform toileting task (3/3 steps) with assistance level: (Goals downgraded based on slower progress than originally expected) Moderate Assistance - Patient 50 - 74% Note: Goals downgraded based on slower progress than originally expected   Problem: RH Toilet Transfers Goal: LTG Patient will perform toilet transfers w/assist (OT) Description: LTG: Patient will perform toilet transfers with assist, with/without cues using equipment (OT) Flowsheets (Taken 03/07/2021 1646) LTG: Pt will perform toilet transfers with assistance level of: (Goals downgraded based on slower progress than originally expected) Minimal Assistance - Patient > 75% Note: Goals downgraded based on slower progress than originally expected

## 2021-03-07 NOTE — Progress Notes (Signed)
PROGRESS NOTE   Subjective/Complaints: Still having headaches.  Both occipital as well as frontal.  CT scan last week was unchanged.  She has no other new neurologic deficits  ROS- neg CP, SOB, N/V, +migraine Objective:   No results found. No results for input(s): WBC, HGB, HCT, PLT in the last 72 hours.  No results for input(s): NA, K, CL, CO2, GLUCOSE, BUN, CREATININE, CALCIUM in the last 72 hours.   Intake/Output Summary (Last 24 hours) at 03/07/2021 0911 Last data filed at 03/06/2021 1900 Gross per 24 hour  Intake 120 ml  Output --  Net 120 ml         Physical Exam: Vital Signs Blood pressure (!) 99/55, pulse 77, temperature 98.9 F (37.2 C), resp. rate 16, height 5\' 4"  (1.626 m), weight 58.1 kg, SpO2 98 %.  General: No acute distress Mood and affect are appropriate Heart: Regular rate and rhythm no rubs murmurs or extra sounds Lungs: Clear to auscultation, breathing unlabored, no rales or wheezes Abdomen: Positive bowel sounds, soft nontender to palpation, nondistended  Extremities: No clubbing, cyanosis, or edema Skin: No evidence of breakdown, no evidence of rash Neurologic: Alert and oriented  motor strength is 5/5 in right and 3- Leftdeltoid, bicep, tricep, grip, trace hip flexor, knee extensors, ankle dorsiflexor and plantar flexor Sensory exam normal sensation to light touch RIght and absent left  upper and lower extremities Cerebellar exam unable to perform on left due to weakness Musculoskeletal: Full range of motion in all 4 extremities. No joint swelling, tight in upper traps  Mild tenderness to back of neck  Assessment/Plan: 1. Functional deficits which require 3+ hours per day of interdisciplinary therapy in a comprehensive inpatient rehab setting. Physiatrist is providing close team supervision and 24 hour management of active medical problems listed below. Physiatrist and rehab team continue to  assess barriers to discharge/monitor patient progress toward functional and medical goals  Care Tool:  Bathing    Body parts bathed by patient: Chest, Abdomen, Face, Left arm, Right upper leg, Left upper leg   Body parts bathed by helper: Left lower leg, Right lower leg, Front perineal area, Buttocks, Right arm Body parts n/a: Right upper leg, Left upper leg, Buttocks, Front perineal area, Left lower leg, Right lower leg (did not attempt)   Bathing assist Assist Level: 2 Helpers (sit to stand)     Upper Body Dressing/Undressing Upper body dressing   What is the patient wearing?: Pull over shirt    Upper body assist Assist Level: Maximal Assistance - Patient 25 - 49%    Lower Body Dressing/Undressing Lower body dressing      What is the patient wearing?: Pants     Lower body assist Assist for lower body dressing: 2 Helpers     Toileting Toileting    Toileting assist Assist for toileting: 2 Helpers     Transfers Chair/bed transfer  Transfers assist  Chair/bed transfer activity did not occur: Safety/medical concerns  Chair/bed transfer assist level: Moderate Assistance - Patient 50 - 74% (squat pivot)     Locomotion Ambulation   Ambulation assist   Ambulation activity did not occur: Refused  Assist level: 2 helpers  Assistive device: Other (comment) (3 Musketeer) Max distance: 33ft   Walk 10 feet activity   Assist  Walk 10 feet activity did not occur: Refused  Assist level: 2 helpers Assistive device: Other (comment) (3 Musketeer)   Walk 50 feet activity   Assist Walk 50 feet with 2 turns activity did not occur: Safety/medical concerns         Walk 150 feet activity   Assist Walk 150 feet activity did not occur: Safety/medical concerns         Walk 10 feet on uneven surface  activity   Assist Walk 10 feet on uneven surfaces activity did not occur: Safety/medical concerns         Wheelchair     Assist Will patient use  wheelchair at discharge?: Yes Type of Wheelchair: Manual      Max wheelchair distance: 20 ft    Wheelchair 50 feet with 2 turns activity    Assist        Assist Level: Maximal Assistance - Patient 25 - 49%   Wheelchair 150 feet activity     Assist      Assist Level: Dependent - Patient 0%, Total Assistance - Patient < 25%   Blood pressure (!) 99/55, pulse 77, temperature 98.9 F (37.2 C), resp. rate 16, height 5\' 4"  (1.626 m), weight 58.1 kg, SpO2 98 %.    Medical Problem List and Plan: 1. 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  Antithrombotics: -DVT/anticoagulation:  Mechanical: Sequential compression devices, below knee Bilateral lower extremities             -antiplatelet therapy: N/A 3. Headaches/Pain Management: Oxycodone prn for HA             --monitor for symptoms--may need Topamax. 4. Anxiety: LCSW to follow for evaluation and support. Recommended to continue to minimize use of Ativan due to negative effects of long term use on cognition.             -antipsychotic agents: N/A 5. Neuropsych: This patient is not capable of making decisions on her own behalf. 6. Skin/Wound Care: Routine pressure relief measures. D/c IV after magnesium supplementation as per patient request 7. Fluids/Electrolytes/Nutrition: Monitor I/O. Check CMET in am. 8. HTN: Monitor BP tid--continue Lisinopril and metoprolol. --Poorly controlled -->SBP goal<160 9. Seizure prophylaxis: As per discharge summary, neurology recommended discontinuing Keppra in case it was contributing to hallucinations, but states that hallucinations were present prior to admission and patient has been tremulous/twitchy raising some concern for seizure. 6/19 to 6/20 showed no evidence of seizures.  Consider discontinuing 6/25 one week after ICH if remains seizure free. d/c 6/27 10. Intermittent fevers: Being monitored and have resolved without treatment. 11. Anemia of chronic illness?: Continue to monitor H/H with serial checks--around 9 since 12/2020. 12. Hypomagnesemia: Resume home dose of supplement. IV supplementation on 6/25 13. Hypokalemia: Will add daily supplement.just added KCL  14. 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  Freq BM will reduce senna S to 1 po  qhs 16.   HA add topiramate on 6/24- increased to 50mg  BID on 6/26- likely combination of vascular HA and tension type HA , add sportcream to traps as well as Kpad , hesitate with muscle relaxer due to sedation side effects  Will increase topiramate to 75 mg twice daily on 7/1, may use tramadol for as needed Discussed with patient and husband LOS: 8 days A FACE TO FACE EVALUATION WAS PERFORMED  Charlett Blake 03/07/2021, 9:11 AM

## 2021-03-08 DIAGNOSIS — G44209 Tension-type headache, unspecified, not intractable: Secondary | ICD-10-CM

## 2021-03-08 DIAGNOSIS — I10 Essential (primary) hypertension: Secondary | ICD-10-CM

## 2021-03-08 MED ORDER — TIZANIDINE HCL 2 MG PO TABS
2.0000 mg | ORAL_TABLET | Freq: Every day | ORAL | Status: DC
  Administered 2021-03-08: 2 mg via ORAL
  Filled 2021-03-08: qty 1

## 2021-03-08 NOTE — Progress Notes (Signed)
PROGRESS NOTE   Subjective/Complaints: C/o ongoing headaches. A lot of symptoms are in upper shoulders and neck. Waking up most mornings with a lot of pain at the base of her neck and occiput.   ROS: Patient denies fever, rash, sore throat, blurred vision, nausea, vomiting, diarrhea, cough, shortness of breath or chest pain, joint or back pain,   or mood change.    Objective:   No results found. No results for input(s): WBC, HGB, HCT, PLT in the last 72 hours.  No results for input(s): NA, K, CL, CO2, GLUCOSE, BUN, CREATININE, CALCIUM in the last 72 hours.  No intake or output data in the 24 hours ending 03/08/21 1207      Physical Exam: Vital Signs Blood pressure 105/60, pulse 72, temperature 98.4 F (36.9 C), temperature source Oral, resp. rate 16, height 5\' 4"  (1.626 m), weight 58.1 kg, SpO2 100 %.  Constitutional: No distress . Vital signs reviewed. HEENT: EOMI, oral membranes moist Neck: supple Cardiovascular: RRR without murmur. No JVD    Respiratory/Chest: CTA Bilaterally without wheezes or rales. Normal effort    GI/Abdomen: BS +, non-tender, non-distended Ext: no clubbing, cyanosis, or edema Psych: pleasant and cooperative  Skin: No evidence of breakdown, no evidence of rash Neurologic: Alert and oriented  motor strength is 5/5 in right and 3- Leftdeltoid, bicep, tricep, grip, trace hip flexor, knee extensors, ankle dorsiflexor and plantar flexor Sensory exam normal sensation to light touch RIght and absent left  upper and lower extremities Cerebellar exam unable to perform on left due to weakness Musculoskeletal: Full range of motion in all 4 extremities.  Has pain tightness with palpation in upper traps, neck, occiput     Assessment/Plan: 1. Functional deficits which require 3+ hours per day of interdisciplinary therapy in a comprehensive inpatient rehab setting. Physiatrist is providing close team  supervision and 24 hour management of active medical problems listed below. Physiatrist and rehab team continue to assess barriers to discharge/monitor patient progress toward functional and medical goals  Care Tool:  Bathing    Body parts bathed by patient: Chest, Abdomen, Face, Left arm, Right upper leg, Left upper leg   Body parts bathed by helper: Left lower leg, Right lower leg, Front perineal area, Buttocks, Right arm Body parts n/a: Right upper leg, Left upper leg, Buttocks, Front perineal area, Left lower leg, Right lower leg (did not attempt)   Bathing assist Assist Level: 2 Helpers (sit to stand)     Upper Body Dressing/Undressing Upper body dressing   What is the patient wearing?: Pull over shirt    Upper body assist Assist Level: Maximal Assistance - Patient 25 - 49%    Lower Body Dressing/Undressing Lower body dressing      What is the patient wearing?: Pants     Lower body assist Assist for lower body dressing: 2 Helpers     Toileting Toileting    Toileting assist Assist for toileting: Maximal Assistance - Patient 25 - 49%     Transfers Chair/bed transfer  Transfers assist  Chair/bed transfer activity did not occur: Safety/medical concerns  Chair/bed transfer assist level: Moderate Assistance - Patient 50 - 74% (squat pivot)  Locomotion Ambulation   Ambulation assist   Ambulation activity did not occur: Refused  Assist level: 2 helpers Assistive device: Other (comment) (3 Musketeer) Max distance: 59ft   Walk 10 feet activity   Assist  Walk 10 feet activity did not occur: Refused  Assist level: 2 helpers Assistive device: Other (comment) (3 Musketeer)   Walk 50 feet activity   Assist Walk 50 feet with 2 turns activity did not occur: Safety/medical concerns         Walk 150 feet activity   Assist Walk 150 feet activity did not occur: Safety/medical concerns         Walk 10 feet on uneven surface  activity   Assist  Walk 10 feet on uneven surfaces activity did not occur: Safety/medical concerns         Wheelchair     Assist Will patient use wheelchair at discharge?: Yes Type of Wheelchair: Manual      Max wheelchair distance: 20 ft    Wheelchair 50 feet with 2 turns activity    Assist        Assist Level: Maximal Assistance - Patient 25 - 49%   Wheelchair 150 feet activity     Assist      Assist Level: Dependent - Patient 0%, Total Assistance - Patient < 25%   Blood pressure 105/60, pulse 72, temperature 98.4 F (36.9 C), temperature source Oral, resp. rate 16, height 5\' 4"  (1.626 m), weight 58.1 kg, SpO2 100 %.    Medical Problem List and Plan: 1. 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  Antithrombotics: -DVT/anticoagulation:  Mechanical: Sequential compression devices, below knee Bilateral lower extremities             -antiplatelet therapy: N/A 3. Headaches/Pain Management: Oxycodone prn for HA             --see below 4. Anxiety: LCSW to follow for evaluation and support. Recommended to continue to minimize use of Ativan due to negative effects of long term use on cognition.             -antipsychotic agents: N/A 5. Neuropsych: This patient is not capable of making decisions on her own behalf. 6. Skin/Wound Care: Routine pressure relief measures. D/c IV after magnesium supplementation as per patient request 7. Fluids/Electrolytes/Nutrition: Monitor I/O. Check CMET in am. 8. HTN: Monitor BP tid--continue Lisinopril and metoprolol. --7/2 improved control 9. Seizure prophylaxis: As per discharge summary, neurology recommended discontinuing Keppra in case it was contributing to hallucinations, but states that hallucinations were present  prior to admission and patient has been tremulous/twitchy raising some concern for seizure. 6/19 to 6/20 showed no evidence of seizures. Consider discontinuing 6/25 one week after ICH if remains seizure free. d/c'ed 6/27 10. Intermittent fevers: Being monitored and have resolved without treatment. 11. Anemia of chronic illness?: Continue to monitor H/H with serial checks--around 9 since 12/2020. 12. Hypomagnesemia: Resume home dose of supplement. IV supplementation on 6/25 13. Hypokalemia: Will add daily supplement.just added KCL  14. 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  Freq BM reduced senna S to 1 po qhs 16.   HA : likely vascular and tension/muscular -continue topamax 75mg  bid, sports cream -will try low dose tizanidine only at bedtime 2mg . Observe for tolerance. Pt wants to try.  -oxycodone as needed    LOS: 9 days A FACE TO FACE EVALUATION WAS PERFORMED  Meredith Staggers 03/08/2021, 12:07 PM

## 2021-03-08 NOTE — Plan of Care (Signed)
  Problem: Consults Goal: RH STROKE PATIENT EDUCATION Description: See Patient Education module for education specifics  Outcome: Progressing   Problem: RH BOWEL ELIMINATION Goal: RH STG MANAGE BOWEL WITH ASSISTANCE Description: STG Manage Bowel with mod I Assistance. Outcome: Progressing Goal: RH STG MANAGE BOWEL W/MEDICATION W/ASSISTANCE Description: STG Manage Bowel with Medication with mod I Assistance. Outcome: Progressing   Problem: RH BLADDER ELIMINATION Goal: RH STG MANAGE BLADDER WITH ASSISTANCE Description: STG Manage Bladder With out Assistance Outcome: Progressing   Problem: RH SAFETY Goal: RH STG ADHERE TO SAFETY PRECAUTIONS W/ASSISTANCE/DEVICE Description: STG Adhere to Safety Precautions With cues/reminders Assistance/Device. Outcome: Progressing   Problem: RH COGNITION-NURSING Goal: RH STG USES MEMORY AIDS/STRATEGIES W/ASSIST TO PROBLEM SOLVE Description: STG Uses Memory Aids/Strategies With cues/reminders Assistance to Problem Solve. Outcome: Progressing   Problem: RH KNOWLEDGE DEFICIT Goal: RH STG INCREASE KNOWLEDGE OF HYPERTENSION Description: Patient will be able to manage HTN with medications and dietary modifications using handouts and educational tools independently Outcome: Progressing Goal: RH STG INCREASE KNOWLEGDE OF HYPERLIPIDEMIA Description: Patient will be able to manage HLD with medications and dietary modifications using handouts and educational tools independently Outcome: Progressing Goal: RH STG INCREASE KNOWLEDGE OF STROKE PROPHYLAXIS Description: Patient will be able to manage secondary stroke risks with medications and dietary modifications using handouts and educational tools independently Outcome: Progressing

## 2021-03-08 NOTE — Progress Notes (Signed)
Occupational Therapy Session Note  Patient Details  Name: Tonya [name] MRN: [id number] Date of Birth: [birth year]  Today's Date: 03/08/2021 OT Individual Time: 4975-3005 OT Individual Time Calculation (min): 45 min  and Today's Date: 03/08/2021 OT Missed Time: 15 Minutes Missed Time Reason: Other (comment) (pt reports headache and fatigue)   Short Term Goals: Week 2:  OT Short Term Goal 1 (Week 2): Pt will locate washcloth/soap on the left of the sink with no more than mod questioning cueing. OT Short Term Goal 2 (Week 2): Pt will complete UB dressing with mod assist following hemi techniques. OT Short Term Goal 3 (Week 2): Pt will complete LB bathing with mod assist sit to stand. OT Short Term Goal 4 (Week 2): Pt will donn pull up pants sit to stand with mod assist. OT Short Term Goal 5 (Week 2): Pt will complete squat pivot transfer to the drop arm commode with mod assist.  Skilled Therapeutic Interventions/Progress Updates:    Pt received supine in bed with RN administering meds. Pt very flat this AM and emotional at beginning of session with OT providing support and encouragement. Pt requesting to toilet, completing supine>sit with max A and sit<>stand using stedy to transfer to/from toilet and back to bed. Pt assisted with peri hygiene during toileting task with OT providing assist for sitting balance. Upon returning to bed, per pt's request, OT provided multimodal cues for midline orientation with static sitting x3 min. Pt requesting to end session, therefore OT provided max A for sit>supine. Pt left with all needs in reach and bed alarm on.   Therapy Documentation Precautions:  Precautions Precautions: Fall Precaution Comments: L hemipareisis, L inattention, mild pushing to L, L field of vision cut Restrictions Weight Bearing Restrictions: No General: General OT Amount of Missed Time: 15 Minutes Vital Signs:   Pain: Pain Assessment Pain Scale: 0-10 Pain Score:  10-Worst pain ever Pain Type: Acute pain Pain Location: Head Pain Descriptors / Indicators: Aching Pain Onset: Gradual Pain Intervention(s): Medication (See eMAR) ADL: ADL Eating: Set up Where Assessed-Eating: Chair Grooming: Minimal assistance Where Assessed-Grooming: Sitting at sink Upper Body Bathing: Moderate assistance Where Assessed-Upper Body Bathing: Sitting at sink Lower Body Bathing: Maximal assistance Where Assessed-Lower Body Bathing: Sitting at sink Upper Body Dressing: Moderate cueing, Maximal assistance Where Assessed-Upper Body Dressing: Sitting at sink Lower Body Dressing: Maximal assistance Where Assessed-Lower Body Dressing: Sitting at sink, Standing at sink Toileting: Maximal assistance Where Assessed-Toileting: Glass blower/designer: Maximal Print production planner Method: Arts development officer: Energy manager Method: Unable to assess [employer] Transfer: Unable to assess [employer] Method: Unable to assess Vision   Perception    Praxis   Exercises:   Other Treatments:     Therapy/Group: Individual Therapy  Duayne Cal 03/08/2021, 9:48 AM

## 2021-03-08 NOTE — Progress Notes (Signed)
Occupational Therapy Session Note  Patient Details  Name: Tonya [name] MRN: [id number] Date of Birth: [birth year]  Today's Date: 03/08/2021 OT Individual Time: 3976-7341 OT Individual Time Calculation (min): 58 min    Short Term Goals: Week 2:  OT Short Term Goal 1 (Week 2): Pt will locate washcloth/soap on the left of the sink with no more than mod questioning cueing. OT Short Term Goal 2 (Week 2): Pt will complete UB dressing with mod assist following hemi techniques. OT Short Term Goal 3 (Week 2): Pt will complete LB bathing with mod assist sit to stand. OT Short Term Goal 4 (Week 2): Pt will donn pull up pants sit to stand with mod assist. OT Short Term Goal 5 (Week 2): Pt will complete squat pivot transfer to the drop arm commode with mod assist.  Skilled Therapeutic Interventions/Progress Updates:     Pt received in bed with 8 out of 10 pain in neck/head ache. See below provided for pain relief  ADL:  Sup>sit EOB with A to manage LE. Pt requires increased time to process cuing and L attention Pt completes UB dressing with MOD A to doff and MAX A to don new pull over shirt. Pt requires multimodal cuing for L attention Pt completes LB dressing with total A for time management at end of session. STS with MODA at EOB with L knee block  Therapeutic activity Pt with 8/10 persistent HA. Agreeable to low stim environment, heat and soft tissue mobilization for HA symptoms along trap, cervical erectors. Educated/performed latera flexion stretch as well as cervical rotation stretch with mobilization of tissue.  C-A-L-M (chest, arms, legs, mind) meditation/body scan provided for pain/anxiety management with education regarding breathwork to stimulate the parasympathetic nervous system.   Pt reports feeling "a lot better" after pain relief strategies;  Pt left at end of session in bed with exit alarm on, call light in reach and all needs met   Therapy  Documentation Precautions:  Precautions Precautions: Fall Precaution Comments: L hemipareisis, L inattention, mild pushing to L, L field of vision cut Restrictions Weight Bearing Restrictions: No General:   Vital Signs: Therapy Vitals Temp: 98.4 F (36.9 C) Temp Source: Oral Pulse Rate: 72 Resp: 16 BP: 105/60 Patient Position (if appropriate): Lying Oxygen Therapy SpO2: 100 % O2 Device: Room Air Pain:   ADL: ADL Eating: Set up Where Assessed-Eating: Chair Grooming: Minimal assistance Where Assessed-Grooming: Sitting at sink Upper Body Bathing: Moderate assistance Where Assessed-Upper Body Bathing: Sitting at sink Lower Body Bathing: Maximal assistance Where Assessed-Lower Body Bathing: Sitting at sink Upper Body Dressing: Moderate cueing, Maximal assistance Where Assessed-Upper Body Dressing: Sitting at sink Lower Body Dressing: Maximal assistance Where Assessed-Lower Body Dressing: Sitting at sink, Standing at sink Toileting: Maximal assistance Where Assessed-Toileting: Glass blower/designer: Maximal Print production planner Method: Arts development officer: Energy manager Method: Unable to assess [employer] Transfer: Unable to assess [employer] Method: Unable to assess Vision   Perception    Praxis   Exercises:   Other Treatments:     Therapy/Group: Individual Therapy  Tonny Branch 03/08/2021, 6:51 AM

## 2021-03-09 MED ORDER — MUSCLE RUB 10-15 % EX CREA
TOPICAL_CREAM | Freq: Two times a day (BID) | CUTANEOUS | Status: DC
  Filled 2021-03-09: qty 85

## 2021-03-09 MED ORDER — LIDOCAINE 5 % EX PTCH
2.0000 | MEDICATED_PATCH | CUTANEOUS | Status: DC
  Administered 2021-03-09 – 2021-03-25 (×15): 2 via TRANSDERMAL
  Filled 2021-03-09 (×15): qty 2

## 2021-03-09 NOTE — Progress Notes (Signed)
PROGRESS NOTE   Subjective/Complaints: Still having headaches both frontal and cervico-occipital. No change this morning. BP's running low, too SBP in 80's  ROS: Patient denies fever, rash, sore throat, blurred vision, nausea, vomiting, diarrhea, cough, shortness of breath or chest pain, or mood change.    Objective:   No results found. No results for input(s): WBC, HGB, HCT, PLT in the last 72 hours.  No results for input(s): NA, K, CL, CO2, GLUCOSE, BUN, CREATININE, CALCIUM in the last 72 hours.   Intake/Output Summary (Last 24 hours) at 03/09/2021 0935 Last data filed at 03/08/2021 1848 Gross per 24 hour  Intake 120 ml  Output --  Net 120 ml        Physical Exam: Vital Signs Blood pressure (!) 85/51, pulse 81, temperature 98.8 F (37.1 C), temperature source Oral, resp. rate 18, height 5\' 4"  (1.626 m), weight 58.1 kg, SpO2 99 %.  Constitutional: No distress . Vital signs reviewed. HEENT: EOMI, oral membranes moist Neck: supple Cardiovascular: RRR without murmur. No JVD    Respiratory/Chest: CTA Bilaterally without wheezes or rales. Normal effort    GI/Abdomen: BS +, non-tender, non-distended Ext: no clubbing, cyanosis, or edema Psych: pleasant and cooperative  Skin: No evidence of breakdown, no evidence of rash Neurologic: Alert and oriented  motor strength is 5/5 in right and 3- Leftdeltoid, bicep, tricep, grip, trace hip flexor, knee extensors, ankle dorsiflexor and plantar flexor Sensory exam normal sensation to light touch RIght and absent left  upper and lower extremities Cerebellar exam unable to perform on left due to weakness Musculoskeletal: Full range of motion in all 4 extremities.  Has pain with palpation and tightness in upper traps, neck, occiput     Assessment/Plan: 1. Functional deficits which require 3+ hours per day of interdisciplinary therapy in a comprehensive inpatient rehab  setting. Physiatrist is providing close team supervision and 24 hour management of active medical problems listed below. Physiatrist and rehab team continue to assess barriers to discharge/monitor patient progress toward functional and medical goals  Care Tool:  Bathing    Body parts bathed by patient: Chest, Abdomen, Face, Left arm, Right upper leg, Left upper leg   Body parts bathed by helper: Left lower leg, Right lower leg, Front perineal area, Buttocks, Right arm Body parts n/a: Right upper leg, Left upper leg, Buttocks, Front perineal area, Left lower leg, Right lower leg (did not attempt)   Bathing assist Assist Level: 2 Helpers (sit to stand)     Upper Body Dressing/Undressing Upper body dressing   What is the patient wearing?: Pull over shirt    Upper body assist Assist Level: Maximal Assistance - Patient 25 - 49%    Lower Body Dressing/Undressing Lower body dressing      What is the patient wearing?: Pants     Lower body assist Assist for lower body dressing: 2 Helpers     Toileting Toileting    Toileting assist Assist for toileting: Maximal Assistance - Patient 25 - 49%     Transfers Chair/bed transfer  Transfers assist  Chair/bed transfer activity did not occur: Safety/medical concerns  Chair/bed transfer assist level: Moderate Assistance - Patient 50 - 74% (  squat pivot)     Locomotion Ambulation   Ambulation assist   Ambulation activity did not occur: Refused  Assist level: 2 helpers Assistive device: Other (comment) (3 Musketeer) Max distance: 29ft   Walk 10 feet activity   Assist  Walk 10 feet activity did not occur: Refused  Assist level: 2 helpers Assistive device: Other (comment) (3 Musketeer)   Walk 50 feet activity   Assist Walk 50 feet with 2 turns activity did not occur: Safety/medical concerns         Walk 150 feet activity   Assist Walk 150 feet activity did not occur: Safety/medical concerns         Walk 10  feet on uneven surface  activity   Assist Walk 10 feet on uneven surfaces activity did not occur: Safety/medical concerns         Wheelchair     Assist Will patient use wheelchair at discharge?: Yes Type of Wheelchair: Manual      Max wheelchair distance: 20 ft    Wheelchair 50 feet with 2 turns activity    Assist        Assist Level: Maximal Assistance - Patient 25 - 49%   Wheelchair 150 feet activity     Assist      Assist Level: Dependent - Patient 0%, Total Assistance - Patient < 25%   Blood pressure (!) 85/51, pulse 81, temperature 98.8 F (37.1 C), temperature source Oral, resp. rate 18, height 5\' 4"  (1.626 m), weight 58.1 kg, SpO2 99 %.    Medical Problem List and Plan: 1. 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  Antithrombotics: -DVT/anticoagulation:  Mechanical: Sequential compression devices, below knee Bilateral lower extremities             -antiplatelet therapy: N/A 3. Headaches/Pain Management: Oxycodone prn for HA             --see below 4. Anxiety: LCSW to follow for evaluation and support. Recommended to continue to minimize use of Ativan due to negative effects of long term use on cognition.             -antipsychotic agents: N/A 5. Neuropsych: This patient is not capable of making decisions on her own behalf. 6. Skin/Wound Care: Routine pressure relief measures. D/c IV after magnesium supplementation as per patient request 7. Fluids/Electrolytes/Nutrition: Monitor I/O. Check CMET in am. 8. HTN: Monitor BP tid--currently on Lisinopril.. --7/3--becoming increasingly hypotensive  -stop lisinopril and hold off on tizanidine as well  -BUN was sl up last week---push po and recheck tomorrow  -TEDS 9.  Seizure prophylaxis: As per discharge summary, neurology recommended discontinuing Keppra in case it was contributing to hallucinations, but states that hallucinations were present prior to admission and patient has been tremulous/twitchy raising some concern for seizure. 6/19 to 6/20 showed no evidence of seizures. Consider discontinuing 6/25 one week after ICH if remains seizure free. d/c'ed 6/27 10. Intermittent fevers: Being monitored and have resolved without treatment. 11. Anemia of chronic illness?: Continue to monitor H/H with serial checks--around 9 since 12/2020. 12. Hypomagnesemia: Resume home dose of supplement. IV  supplementation on 6/25 13. Hypokalemia: Will add daily supplement.just added KCL  14. 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  Freq BM reduced senna S to 1 po qhs 16.   HA : likely vascular and tension/muscular -continue topamax 75mg  bid, will schedule sports cream -will stop tizanidine given hypotension  -oxycodone as needed -add lidocaine patches for neck/upper shoulders    LOS: 10 days A FACE TO FACE EVALUATION WAS PERFORMED  Tonya [name] 03/09/2021, 9:35 AM

## 2021-03-09 NOTE — Plan of Care (Signed)
  Problem: Consults Goal: RH STROKE PATIENT EDUCATION Description: See Patient Education module for education specifics  Outcome: Progressing   Problem: RH BOWEL ELIMINATION Goal: RH STG MANAGE BOWEL WITH ASSISTANCE Description: STG Manage Bowel with mod I Assistance. Outcome: Progressing Goal: RH STG MANAGE BOWEL W/MEDICATION W/ASSISTANCE Description: STG Manage Bowel with Medication with mod I Assistance. Outcome: Progressing   Problem: RH BLADDER ELIMINATION Goal: RH STG MANAGE BLADDER WITH ASSISTANCE Description: STG Manage Bladder With out Assistance Outcome: Progressing   Problem: RH SAFETY Goal: RH STG ADHERE TO SAFETY PRECAUTIONS W/ASSISTANCE/DEVICE Description: STG Adhere to Safety Precautions With cues/reminders Assistance/Device. Outcome: Progressing   Problem: RH COGNITION-NURSING Goal: RH STG USES MEMORY AIDS/STRATEGIES W/ASSIST TO PROBLEM SOLVE Description: STG Uses Memory Aids/Strategies With cues/reminders Assistance to Problem Solve. Outcome: Progressing   Problem: RH KNOWLEDGE DEFICIT Goal: RH STG INCREASE KNOWLEDGE OF HYPERTENSION Description: Patient will be able to manage HTN with medications and dietary modifications using handouts and educational tools independently Outcome: Progressing Goal: RH STG INCREASE KNOWLEGDE OF HYPERLIPIDEMIA Description: Patient will be able to manage HLD with medications and dietary modifications using handouts and educational tools independently Outcome: Progressing Goal: RH STG INCREASE KNOWLEDGE OF STROKE PROPHYLAXIS Description: Patient will be able to manage secondary stroke risks with medications and dietary modifications using handouts and educational tools independently Outcome: Progressing   Problem: RH PAIN MANAGEMENT Goal: RH STG PAIN MANAGED AT OR BELOW PT'S PAIN GOAL Description: At or below level 4 Outcome: Not Progressing

## 2021-03-10 LAB — BASIC METABOLIC PANEL
Anion gap: 8 (ref 5–15)
BUN: 57 mg/dL — ABNORMAL HIGH (ref 8–23)
CO2: 15 mmol/L — ABNORMAL LOW (ref 22–32)
Calcium: 10.5 mg/dL — ABNORMAL HIGH (ref 8.9–10.3)
Chloride: 113 mmol/L — ABNORMAL HIGH (ref 98–111)
Creatinine, Ser: 1.14 mg/dL — ABNORMAL HIGH (ref 0.44–1.00)
GFR, Estimated: 51 mL/min — ABNORMAL LOW (ref >60)
Glucose, Bld: 125 mg/dL — ABNORMAL HIGH (ref 70–99)
Potassium: 5.4 mmol/L — ABNORMAL HIGH (ref 3.5–5.1)
Sodium: 136 mmol/L (ref 135–145)

## 2021-03-10 LAB — CBC
HCT: 35.4 % — ABNORMAL LOW (ref 36.0–46.0)
Hemoglobin: 11.4 g/dL — ABNORMAL LOW (ref 12.0–15.0)
MCH: 33.4 pg (ref 26.0–34.0)
MCHC: 32.2 g/dL (ref 30.0–36.0)
MCV: 103.8 fL — ABNORMAL HIGH (ref 80.0–100.0)
Platelets: 253 K/uL (ref 150–400)
RBC: 3.41 MIL/uL — ABNORMAL LOW (ref 3.87–5.11)
RDW: 13.8 % (ref 11.5–15.5)
WBC: 7.5 K/uL (ref 4.0–10.5)
nRBC: 0 % (ref 0.0–0.2)

## 2021-03-10 NOTE — Progress Notes (Signed)
PROGRESS NOTE   Subjective/Complaints: Feels better today , HA doing better per pt Was on pain meds at home for "sciatic problems"  did not take daily per pt   ROS: Patient denies fever, rash, sore throat, blurred vision, nausea, vomiting, diarrhea, cough, shortness of breath or chest pain, or mood change.    Objective:   No results found. Recent Labs    03/10/21 0600  WBC 7.5  HGB 11.4*  HCT 35.4*  PLT 253    Recent Labs    03/10/21 0600  NA 136  K 5.4*  CL 113*  CO2 15*  GLUCOSE 125*  BUN 57*  CREATININE 1.14*  CALCIUM 10.5*     Intake/Output Summary (Last 24 hours) at 03/10/2021 1112 Last data filed at 03/10/2021 0700 Gross per 24 hour  Intake 320 ml  Output --  Net 320 ml         Physical Exam: Vital Signs Blood pressure (!) 98/57, pulse 86, temperature 98 F (36.7 C), temperature source Oral, resp. rate 18, height 5\' 4"  (1.626 m), weight 58.1 kg, SpO2 98 %.   General: No acute distress Mood and affect are appropriate Heart: Regular rate and rhythm no rubs murmurs or extra sounds Lungs: Clear to auscultation, breathing unlabored, no rales or wheezes Abdomen: Positive bowel sounds, soft nontender to palpation, nondistended Extremities: No clubbing, cyanosis, or edema   Skin: No evidence of breakdown, no evidence of rash Neurologic: Alert and oriented  motor strength is 5/5 in right and 3- Leftdeltoid, bicep, tricep, grip, trace hip flexor, knee extensors, ankle dorsiflexor and plantar flexor Sensory exam normal sensation to light touch RIght and absent left  upper and lower extremities Cerebellar exam unable to perform on left due to weakness Musculoskeletal: Full range of motion in all 4 extremities.  Has pain with palpation and tightness in upper traps, neck, occiput     Assessment/Plan: 1. Functional deficits which require 3+ hours per day of interdisciplinary therapy in a comprehensive  inpatient rehab setting. Physiatrist is providing close team supervision and 24 hour management of active medical problems listed below. Physiatrist and rehab team continue to assess barriers to discharge/monitor patient progress toward functional and medical goals  Care Tool:  Bathing    Body parts bathed by patient: Chest, Abdomen, Face, Left arm, Right upper leg, Left upper leg   Body parts bathed by helper: Left lower leg, Right lower leg, Front perineal area, Buttocks, Right arm Body parts n/a: Right upper leg, Left upper leg, Buttocks, Front perineal area, Left lower leg, Right lower leg (did not attempt)   Bathing assist Assist Level: 2 Helpers (sit to stand)     Upper Body Dressing/Undressing Upper body dressing   What is the patient wearing?: Pull over shirt    Upper body assist Assist Level: Maximal Assistance - Patient 25 - 49%    Lower Body Dressing/Undressing Lower body dressing      What is the patient wearing?: Pants     Lower body assist Assist for lower body dressing: 2 Helpers     Toileting Toileting    Toileting assist Assist for toileting: Maximal Assistance - Patient 25 - 49%  Transfers Chair/bed transfer  Transfers assist  Chair/bed transfer activity did not occur: Safety/medical concerns  Chair/bed transfer assist level: Moderate Assistance - Patient 50 - 74% (squat pivot)     Locomotion Ambulation   Ambulation assist   Ambulation activity did not occur: Refused  Assist level: 2 helpers Assistive device: Other (comment) (3 Musketeer) Max distance: 5ft   Walk 10 feet activity   Assist  Walk 10 feet activity did not occur: Refused  Assist level: 2 helpers Assistive device: Other (comment) (3 Musketeer)   Walk 50 feet activity   Assist Walk 50 feet with 2 turns activity did not occur: Safety/medical concerns         Walk 150 feet activity   Assist Walk 150 feet activity did not occur: Safety/medical concerns          Walk 10 feet on uneven surface  activity   Assist Walk 10 feet on uneven surfaces activity did not occur: Safety/medical concerns         Wheelchair     Assist Will patient use wheelchair at discharge?: Yes Type of Wheelchair: Manual      Max wheelchair distance: 20 ft    Wheelchair 50 feet with 2 turns activity    Assist        Assist Level: Maximal Assistance - Patient 25 - 49%   Wheelchair 150 feet activity     Assist      Assist Level: Dependent - Patient 0%, Total Assistance - Patient < 25%   Blood pressure (!) 98/57, pulse 86, temperature 98 F (36.7 C), temperature source Oral, resp. rate 18, height 5\' 4"  (1.626 m), weight 58.1 kg, SpO2 98 %.    Medical Problem List and Plan: 1. 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  Antithrombotics: -DVT/anticoagulation:  Mechanical: Sequential compression devices, below knee Bilateral lower extremities             -antiplatelet therapy: N/A 3. Headaches/Pain Management: Oxycodone prn for HA             --see below 4. Anxiety: LCSW to follow for evaluation and support. Recommended to continue to minimize use of Ativan due to negative effects of long term use on cognition.             -antipsychotic agents: N/A 5. Neuropsych: This patient is not capable of making decisions on her own behalf. 6. Skin/Wound Care: Routine pressure relief measures. D/c IV after magnesium supplementation as per patient request 7. Fluids/Electrolytes/Nutrition: Monitor I/O. Check CMET in am. 8. HTN: Monitor BP tid--currently on Lisinopril.. --7/3--becoming increasingly hypotensive  -stop lisinopril and hold off on tizanidine as well  -BUN was sl up last week---push po and recheck  tomorrow  -TEDS Vitals:   03/10/21 0514 03/10/21 0933  BP: (!) 98/57   Pulse: 72 86  Resp: 18 18  Temp: 98 F (36.7 C)   SpO2: 99% 98%    9. Seizure prophylaxis: As per discharge summary, neurology recommended discontinuing Keppra in case it was contributing to hallucinations, but states that hallucinations were present prior to admission and patient has  been tremulous/twitchy raising some concern for seizure. 6/19 to 6/20 showed no evidence of seizures. Consider discontinuing 6/25 one week after ICH if remains seizure free. d/c'ed 6/27 10. Intermittent fevers: Being monitored and have resolved without treatment. 11. Anemia of chronic illness?: Continue to monitor H/H with serial checks--around 9 since 12/2020. 12. Hypomagnesemia: Resume home dose of supplement. IV supplementation on 6/25 13. Hypokalemia: Will add daily supplement.just added KCL  14. 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  Freq BM reduced senna S to 1 po qhs 16.   HA : likely vascular and tension/muscular -continue topamax 75mg  bid, will schedule sports cream -off tizanidine for hypotension  -oxycodone as needed -add lidocaine patches for neck/upper shoulders    LOS: 11 days A FACE TO FACE EVALUATION WAS PERFORMED  Charlett Blake 03/10/2021, 11:12 AM

## 2021-03-10 NOTE — Progress Notes (Signed)
Occupational Therapy Session Note  Patient Details  Name: Tonya [name] MRN: [id number] Date of Birth: 10-03-1945  Today's Date: 03/10/2021 OT Individual Time: 8850-2774   &   [id number] OT Individual Time Calculation (min): 62 min    &   60 min   Short Term Goals: Week 2:  OT Short Term Goal 1 (Week 2): Pt will locate washcloth/soap on the left of the sink with no more than mod questioning cueing. OT Short Term Goal 2 (Week 2): Pt will complete UB dressing with mod assist following hemi techniques. OT Short Term Goal 3 (Week 2): Pt will complete LB bathing with mod assist sit to stand. OT Short Term Goal 4 (Week 2): Pt will donn pull up pants sit to stand with mod assist. OT Short Term Goal 5 (Week 2): Pt will complete squat pivot transfer to the drop arm commode with mod assist.  Skilled Therapeutic Interventions/Progress Updates:    AM session:    Patient in bed, alert, c/o HA 6/10 but willing to complete light activity.  Breakfast tray in front of her untouched.  Assisted with cutting and setting up meal and drinks - encouragement, min A for fork to mouth and increased time for a few bites of food, able to bring cup to mouth.  Supine to sitting edge of bed with mod/max A.  Mild lean to left that she is able to correct with min verbal and tactile cues.  Sit pivot transfer bed to w/c with mod A.  Doff [place] shirt with mod/max A, donn [place] shirt with mod A, cues and increased time.  Max A to donn pants, sit to stand with right bed rail mod/max A, max A to complete clothing management in stance.  Completed left UE AROM/AAROM.  She remained seated in w/c at close of session, husband present, seat belt alarm set and call bell in reach.     PM session:   Patient in bed, alert, states that she is tired.  C/o HA 4/10.  She ate a few bites of her sandwich.  Supine to sitting edge of bed with min A.  Sit pivot transfer bed to w/c mod A.  Sit pivot transfer to/from mat table with mod/max A.   Completed seated posture, trunk mobility, visual scanning, reaching with both right and left hands - requires ongoing cues and facilitation for upright posture, HOH/tactile cues for awareness of left side at times.  Returned to bed at close of session, sit to supine max A.  She remained in bed at close of session, bed alarm set and call bell in hand.       Therapy Documentation Precautions:  Precautions Precautions: Fall Precaution Comments: L hemipareisis, L inattention, mild pushing to L, L field of vision cut Restrictions Weight Bearing Restrictions: No  Therapy/Group: Individual Therapy  Carlos Levering 03/10/2021, 7:31 AM

## 2021-03-10 NOTE — Progress Notes (Signed)
Occupational Therapy Session Note  Patient Details  Name: Tonya [name] MRN: [id number] Date of Birth: 1946/07/12  Today's Date: 03/10/2021 OT Individual Time: 1430-1515 OT Individual Time Calculation (min): 45 min    Short Term Goals: Week 2:  OT Short Term Goal 1 (Week 2): Pt will locate washcloth/soap on the left of the sink with no more than mod questioning cueing. OT Short Term Goal 2 (Week 2): Pt will complete UB dressing with mod assist following hemi techniques. OT Short Term Goal 3 (Week 2): Pt will complete LB bathing with mod assist sit to stand. OT Short Term Goal 4 (Week 2): Pt will donn pull up pants sit to stand with mod assist. OT Short Term Goal 5 (Week 2): Pt will complete squat pivot transfer to the drop arm commode with mod assist.  Skilled Therapeutic Interventions/Progress Updates:    Pt received supine in bed, dtr present, reporting headache and not wanting to participate in OT initially. With dtr and OT encouragement, pt agreeable to attempt session. Dtr encouraging drinking Ensure but pt stating she wanted it as a milkshake; OT used blender with ice to make milkshake-like texture with pt drinking more. Pt held cup with L hand with intermittent min A. Provided with soft tan theraputty for NMR and FMC/strengthening. Following demo and vc's, pt attempted multiple exercises bed level with theraputty (using w/c lap tray on lap in bed) targeting LUE including pincer grasp, whole hand/cylindrical grasp, finger EXT, and rolling into ball/long cylinder. Pt frequently would include or take over with RUE despite multimodal cueing. Engaged in LUE isometric exercises bed level with HOB elevated (shoulder FLX/EXT, external/internal rotation, and abduction/adduction) 10x with mod resistance; increased difficulties with external/internal rotation motions. Pt demos fair strength at glenohumeral and elbow joints but demoed difficulties in wrist and finger movements against  resistance. Scooting HOB mod A +2 with dtr offering assistance. Education on [employer] as caregiver for dtr for safety. Pt remained supine, all needs met, dtr present, alarm set.   Therapy Documentation Precautions:  Precautions Precautions: Fall Precaution Comments: L hemipareisis, L inattention, mild pushing to L, L field of vision cut Restrictions Weight Bearing Restrictions: No  Pain: Pain Assessment Pain Scale: 0-10 Pain Score:  (unrated) Faces Pain Scale: Hurts even more Pain Type: Acute pain Pain Location: Head Pain Orientation: Medial Pain Descriptors / Indicators: Aching;Headache Pain Onset: On-going Pain Intervention(s): Repositioned;Rest;Emotional support;Distraction   Therapy/Group: Individual Therapy  Mellissa Kohut 03/10/2021, 4:03 PM

## 2021-03-10 NOTE — Progress Notes (Signed)
Physical Therapy Session Note  Patient Details  Name: Tonya [name] MRN: [id number] Date of Birth: September 09, 1945  Today's Date: 03/10/2021 PT Individual Time: 1002-1102 PT Individual Time Calculation (min): 60 min   Short Term Goals: Week 1:  PT Short Term Goal 1 (Week 1): Pt will improve bed mobility to Mod A overall. PT Short Term Goal 1 - Progress (Week 1): Progressing toward goal PT Short Term Goal 2 (Week 1): Pt will perform STS transfers with Mod A to RW. PT Short Term Goal 2 - Progress (Week 1): Progressing toward goal PT Short Term Goal 3 (Week 1): Pt will perform SPVT transfers with Mod/ Max A using RW and decreased cues for sequencing. PT Short Term Goal 3 - Progress (Week 1): Progressing toward goal PT Short Term Goal 4 (Week 1): Pt will initiate gait training. PT Short Term Goal 4 - Progress (Week 1): Met PT Short Term Goal 5 (Week 1): Pt will initiate w/c mobility training. PT Short Term Goal 5 - Progress (Week 1): Progressing toward goal Week 2:  PT Short Term Goal 1 (Week 2): Pt will perform supine<>sit with mod assist PT Short Term Goal 2 (Week 2): Pt  will perform sit<>stands using LRAD with mod assist PT Short Term Goal 3 (Week 2): Pt will perform bed<>chair transfers using LRAD with mod assist PT Short Term Goal 4 (Week 2): Pt will ambulate at least 35ft using LRAD with +2 mod assist   Skilled Therapeutic Interventions/Progress Updates:  Patient seated upright in w/c on entrance to room. Husband present. Patient alert and agreeable to PT session. Patient denied pain during session.  Therapeutic Activity: Transfers: Patient instructed in steps for SPVT transfers in order to transfer from w/c to mat table. Using RW, pt requires Max A for block to L knee, to maintain upright posture at RW, sequence pivot stepping, and to manage walker. Performed STS transfers with anywhere from Mod/ Max A +2 to [place] A/ CGA all depending on level of effort provided by pt.  Provided  verbal cues for foot placement, hand placement and progression, push-to-stand technique.  In prep for squat pivot, pt scoots laterally across EOM with Min A/ CGA. Squat pivot transfer to L mat table--> w/c with Mod A for lift and shift of hips to L.   Gait Training:  Patient ambulated 3' x1/ 12' x1 using EVA walker with Max A with +2 for steadying of walker and w/c follow. Initial bout with pt not using BUE to hold self up and requesting to sit d/t pt stated LE muscle weakness. Education re: need to use strength in shoulders to hold self upright using elbow prop. Pt provided with visual goal to reach prior to move to sit. Pt able to reach goal with imrpoved elbow prop and upright posture, and initiation of LLE advancement requiring Max A for full advancement and placement as well as knee extension and block to prevent buckling. Also requires Max A for hip extension and forward movement of L hip/ pelvis.Provided vc/ tc for L knee extension, LLE advancement, weight shift, sequence of stepping, upright posture.  Neuromuscular Re-ed: NMR facilitated during session with focus on standing balance, forward weight shift, L lateral weight shift/ LLE WB. Pt guided in forward stand to BUE support on armrests of armchair. Performed x3 to promote improved forward weight shift, improved LLE knee extension in stance with decreased WB, equal WB over BLE, and increased muscle activation in LUE extension.  Standing balance challenged at [facility]  with focus on upright posture and L lateral shift. Max A for block to L knee and for upright posture. VC for correcting all throughout. Guided in seated activation of hip flexor group and quadriceps group with AAROM as needed. NMR performed for improvements in motor control and coordination, balance, sequencing, judgement, and self confidence/ efficacy in performing all aspects of mobility at highest level of independence.   Patient seated upright  in w/c at end of session with brakes  locked, belt alarm set, tray table in front of her, and all needs within reach. Husband present.     Therapy Documentation Precautions:  Precautions Precautions: Fall Precaution Comments: L hemipareisis, L inattention, mild pushing to L, L field of vision cut Restrictions Weight Bearing Restrictions: No  Therapy/Group: Individual Therapy  Alger Simons PT, DPT 03/10/2021, 12:54 PM

## 2021-03-11 MED ORDER — TOPIRAMATE 25 MG PO TABS
50.0000 mg | ORAL_TABLET | Freq: Two times a day (BID) | ORAL | Status: DC
  Administered 2021-03-11 – 2021-03-17 (×12): 50 mg via ORAL
  Filled 2021-03-11 (×12): qty 2

## 2021-03-11 MED ORDER — SODIUM CHLORIDE 0.45 % IV SOLN
INTRAVENOUS | Status: DC
  Administered 2021-03-15 – 2021-03-16 (×2): 900 mL via INTRAVENOUS

## 2021-03-11 NOTE — Progress Notes (Signed)
Speech Language Pathology Daily Session Note  Patient Details  Name: Tonya [name] MRN: [id number] Date of Birth: 03/11/1946  Today's Date: 03/11/2021 SLP Individual Time: 1015-1100 SLP Individual Time Calculation (min): 45 min  Short Term Goals: Week 2: SLP Short Term Goal 1 (Week 2): Patient will recall and describe specific therapeutic interventions with PT/OT/SLP with modA cues. SLP Short Term Goal 2 (Week 2): Patient will demonstrate sustained attention to basic level problem solving tasks with modA cues. SLP Short Term Goal 3 (Week 2): Patient will demonstrate adequate reasoning and problem solving when completed simulated functional tasks with modA cues. SLP Short Term Goal 4 (Week 2): Patient will demonstrate consistent emergent awareness to deficits and limitations, with modA -cues. SLP Short Term Goal 5 (Week 2): Patient will perform basic-mild complex organizing and/or sequencing tasks with min A cues.  Skilled Therapeutic Interventions: Skilled ST intervention performed with focus on cognitive goals. Patient appeared lethargic and required frequent encouragement to participate. SLP facilitated use of patient's personal phone with mod A verbal and visual cues for step-by-step recall and visual scanning. Patient was able to enter passcode correctly during 3rd attempt from memory, although when she went to use her phone to contact husband later in session, she was unable to recall passcode and required written cue. She was able to locate "contacts" and "recent calls" icons with mod-to-max A verbal and visual cues to scan to lower half of phone. Facilitated decision making with spouse on phone to determine food options for lunch today. Patient required mod A verbal cues to sustain attention to attend to conversation, and additional processing time and verbal repetition to respond to questions. Patient was left in her chair with alarm activated and needs within reach. Continue per ST  POC.  Pain Pain Assessment Pain Scale: 0-10 Pain Score: 6  Faces Pain Scale: Hurts little more Pain Type: Acute pain Pain Location: Head Pain Descriptors / Indicators: Headache Pain Onset: On-going Pain Intervention(s): Medication (See eMAR) (percocet given)  Therapy/Group: Individual Therapy  [name] 03/11/2021, 3:35 PM

## 2021-03-11 NOTE — Progress Notes (Signed)
Calorie Count Note  48 hour calorie count ordered.  Diet: Heart Healthy/carbohydrate modified with thin liquids Supplements:  Ensure Enlive po QID, each supplement provides 350 kcal and 20 grams of protein. 30 ml Prostat po QID, each supplement provides 100 kcal and 15 grams of protein.   Day 1: Breakfast: 138 kcal, 5 grams of protein Lunch: none Dinner: 350 kcal, 10 grams of protein Supplements: 500 kcal, 45 grams of protein Total intake: 988 kcal (53% of  kcal needs)  60 grams of protein (70% of protein needs)  Day 2: Breakfast: 88 kcal, 5 grams of protein Lunch: none Dinner: 90 kcal, 4 grams of protein Supplements: 570 kcal, 46 grams of protein Total intake: 748 kcal (40% of kcal needs)  55 grams of protein (65% of protein needs)  Estimated Nutritional Needs:  Kcal:  1850-2050 Protein:  85-100 grams Fluid:  >/= 1.8 L/day  Meal completion varied from 0-50%. RN reports pt with poor po and mostly only takes a couple of bites at meals and from her Ensures. Pt not meeting nutrition needs. Discussed calorie count results with PA, Pam Love. No plans for Cortrak and enteral nutrition at this time. Plans to liberalize diet and order multiple snacks throughout the day to encourage PO. Staff and family to encourage pt PO intake.   Nutrition Dx:  Increased nutrient needs related to  (therapy) as evidenced by estimated needs; ongoing   Goal: Patient will meet greater than or equal to 90% of their needs; not met  Intervention:  Discontinue Calorie count. Provide nourishment snacks. RD has ordered.  Liberalize diet to regular.  Provide Ensure Enlive po QID, each supplement provides 350 kcal and 20 grams of protein. Provide 30 ml Prosource plus po QID, each supplement provides 100 kcal and 15 grams of protein.   Corrin Parker, MS, RD, LDN RD pager number/after hours weekend pager number on Amion.

## 2021-03-11 NOTE — Progress Notes (Signed)
Occupational Therapy Session Note  Patient Details  Name: Tonya [name] MRN: [id number] Date of Birth: 07/19/1946  Today's Date: 03/11/2021 OT Individual Time: 1443-6016 OT Individual Time Calculation (min): 25 min    Short Term Goals: Week 2:  OT Short Term Goal 1 (Week 2): Pt will locate washcloth/soap on the left of the sink with no more than mod questioning cueing. OT Short Term Goal 2 (Week 2): Pt will complete UB dressing with mod assist following hemi techniques. OT Short Term Goal 3 (Week 2): Pt will complete LB bathing with mod assist sit to stand. OT Short Term Goal 4 (Week 2): Pt will donn pull up pants sit to stand with mod assist. OT Short Term Goal 5 (Week 2): Pt will complete squat pivot transfer to the drop arm commode with mod assist.   Skilled Therapeutic Interventions/Progress Updates:    Pt greeted at time of session supine in bed with RN pulling up in bed, therapist assist for 2+ help scooting toward HOB. RN performing med pass at beginning of session. Pt wanting to get up OOB, donned pants bed level with Max A rolling L/R with Min/Mod. Pt remembering hemitechnique to thread LLE first. Supine > sit CGA with extended time and bed features. Squat pivot/partial stand pivot to R with Mod A. Positioned LUE in elevation on wheelchair, set up with alarm on call bell in reach and all needs met.   Therapy Documentation Precautions:  Precautions Precautions: Fall Precaution Comments: L hemipareisis, L inattention, mild pushing to L, L field of vision cut Restrictions Weight Bearing Restrictions: No     Therapy/Group: Individual Therapy  Viona Gilmore 03/11/2021, 7:18 AM

## 2021-03-11 NOTE — Progress Notes (Addendum)
PROGRESS NOTE   Subjective/Complaints: HA improving, slept ok, working with putty and blue foam cubes in room   ROS: Patient denies fever, rash, sore throat, blurred vision, nausea, vomiting, diarrhea, cough, shortness of breath or chest pain, or mood change.    Objective:   No results found. Recent Labs    03/10/21 0600  WBC 7.5  HGB 11.4*  HCT 35.4*  PLT 253     Recent Labs    03/10/21 0600  NA 136  K 5.4*  CL 113*  CO2 15*  GLUCOSE 125*  BUN 57*  CREATININE 1.14*  CALCIUM 10.5*      Intake/Output Summary (Last 24 hours) at 03/11/2021 0804 Last data filed at 03/10/2021 1300 Gross per 24 hour  Intake 100 ml  Output --  Net 100 ml         Physical Exam: Vital Signs Blood pressure 96/63, pulse 72, temperature 97.9 F (36.6 C), temperature source Oral, resp. rate 14, height 5\' 4"  (1.626 m), weight 53.7 kg, SpO2 100 %.   General: No acute distress Mood and affect are appropriate Heart: Regular rate and rhythm no rubs murmurs or extra sounds Lungs: Clear to auscultation, breathing unlabored, no rales or wheezes Abdomen: Positive bowel sounds, soft nontender to palpation, nondistended Extremities: No clubbing, cyanosis, or edema   Skin: No evidence of breakdown, no evidence of rash Neurologic: Alert and oriented  motor strength is 5/5 in right and 3- Leftdeltoid, bicep, tricep, grip, trace hip flexor, knee extensors, ankle dorsiflexor and plantar flexor Sensory exam normal sensation to light touch RIght and absent left  upper and lower extremities Cerebellar exam unable to perform on left due to weakness Musculoskeletal: Full range of motion in all 4 extremities.  Has pain with palpation and tightness in upper traps, neck, occiput     Assessment/Plan: 1. Functional deficits which require 3+ hours per day of interdisciplinary therapy in a comprehensive inpatient rehab setting. Physiatrist is  providing close team supervision and 24 hour management of active medical problems listed below. Physiatrist and rehab team continue to assess barriers to discharge/monitor patient progress toward functional and medical goals  Care Tool:  Bathing    Body parts bathed by patient: Chest, Abdomen, Face, Left arm, Right upper leg, Left upper leg   Body parts bathed by helper: Left lower leg, Right lower leg, Front perineal area, Buttocks, Right arm Body parts n/a: Right upper leg, Left upper leg, Buttocks, Front perineal area, Left lower leg, Right lower leg (did not attempt)   Bathing assist Assist Level: 2 Helpers (sit to stand)     Upper Body Dressing/Undressing Upper body dressing   What is the patient wearing?: Pull over shirt    Upper body assist Assist Level: Moderate Assistance - Patient 50 - 74%    Lower Body Dressing/Undressing Lower body dressing      What is the patient wearing?: Pants     Lower body assist Assist for lower body dressing: Maximal Assistance - Patient 25 - 49%     Toileting Toileting    Toileting assist Assist for toileting: Maximal Assistance - Patient 25 - 49%     Transfers Chair/bed transfer  Transfers assist  Chair/bed transfer activity did not occur: Safety/medical concerns  Chair/bed transfer assist level: Moderate Assistance - Patient 50 - 74% (squat pivot)     Locomotion Ambulation   Ambulation assist   Ambulation activity did not occur: Refused  Assist level: 2 helpers Assistive device: Other (comment) (3 Musketeer) Max distance: 77ft   Walk 10 feet activity   Assist  Walk 10 feet activity did not occur: Refused  Assist level: 2 helpers Assistive device: Other (comment) (3 Musketeer)   Walk 50 feet activity   Assist Walk 50 feet with 2 turns activity did not occur: Safety/medical concerns         Walk 150 feet activity   Assist Walk 150 feet activity did not occur: Safety/medical concerns          Walk 10 feet on uneven surface  activity   Assist Walk 10 feet on uneven surfaces activity did not occur: Safety/medical concerns         Wheelchair     Assist Will patient use wheelchair at discharge?: Yes Type of Wheelchair: Manual      Max wheelchair distance: 20 ft    Wheelchair 50 feet with 2 turns activity    Assist        Assist Level: Maximal Assistance - Patient 25 - 49%   Wheelchair 150 feet activity     Assist      Assist Level: Dependent - Patient 0%, Total Assistance - Patient < 25%   Blood pressure 96/63, pulse 72, temperature 97.9 F (36.6 C), temperature source Oral, resp. rate 14, height 5\' 4"  (1.626 m), weight 53.7 kg, SpO2 100 %.    Medical Problem List and Plan: 1. 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  Antithrombotics: -DVT/anticoagulation:  Mechanical: Sequential compression devices, below knee Bilateral lower extremities             -antiplatelet therapy: N/A 3. Headaches/Pain Management: Oxycodone prn for HA             --see below 4. Anxiety: LCSW to follow for evaluation and support. Recommended to continue to minimize use of Ativan due to negative effects of long term use on cognition.             -antipsychotic agents: N/A 5. Neuropsych: This patient is not capable of making decisions on her own behalf. 6. Skin/Wound Care: Routine pressure relief measures. D/c IV after magnesium supplementation as per patient request 7. Fluids/Electrolytes/Nutrition: Monitor I/O. Check CMET in am. 8. HTN: Monitor BP tid--currently on Lisinopril.Marland Kitchen --BP lower but no dizziness  -stop lisinopril and hold off on tizanidine as well  -BUN is up , poor fluid intake, IVF at noc  -TEDS Vitals:   03/10/21 2058  03/11/21 0547  BP: 101/88 96/63  Pulse: 93 72  Resp: 14 14  Temp: 98.2 F (36.8 C) 97.9 F (36.6 C)  SpO2: 100% 100%    9. Seizure prophylaxis: As per discharge summary, neurology recommended discontinuing Keppra in case it was contributing to hallucinations, but states that hallucinations were present prior to admission and patient has been  tremulous/twitchy raising some concern for seizure. 6/19 to 6/20 showed no evidence of seizures. Consider discontinuing 6/25 one week after ICH if remains seizure free. d/c'ed 6/27 10. Intermittent fevers: Being monitored and have resolved without treatment. 11. Anemia of chronic illness?: Continue to monitor H/H with serial checks--around 9 since 12/2020. 12. Hypomagnesemia: Resume home dose of supplement. IV supplementation on 6/25 13. Hypokalemia: Will add daily supplement.just added KCL  14. 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  Freq BM reduced senna S to 1 po qhs 16.   HA : likely vascular and tension/muscular -continue topamax 75mg  bid, will schedule sports cream -off tizanidine for hypotension  -oxycodone as needed -add lidocaine patches for neck/upper shoulders  17.  Pre renal azotemia, low CO2 , question effect of topirimate, will reduce dose and give IVF at noc, (low po intake)   LOS: 12 days A FACE TO FACE EVALUATION WAS PERFORMED  Charlett Blake 03/11/2021, 8:04 AM

## 2021-03-11 NOTE — Progress Notes (Signed)
Occupational Therapy Session Note  Patient Details  Name: Tonya [name] MRN: [id number] Date of Birth: Oct 31, 1945  Today's Date: 03/11/2021 OT Individual Time: 1104-1200 OT Individual Time Calculation (min): 56 min    Short Term Goals: Week 2:  OT Short Term Goal 1 (Week 2): Pt will locate washcloth/soap on the left of the sink with no more than mod questioning cueing. OT Short Term Goal 2 (Week 2): Pt will complete UB dressing with mod assist following hemi techniques. OT Short Term Goal 3 (Week 2): Pt will complete LB bathing with mod assist sit to stand. OT Short Term Goal 4 (Week 2): Pt will donn pull up pants sit to stand with mod assist. OT Short Term Goal 5 (Week 2): Pt will complete squat pivot transfer to the drop arm commode with mod assist.  Skilled Therapeutic Interventions/Progress Updates:    Pt up in the wheelchair to start with head flexed and turned to the right when therapist entered the room.  He was able to sneak up on her left side and touch her left arm, to which she was able to feel it and then turn her head to the left to see and talk to therapist.  She worked on oral hygiene at the sink with incorporation of the LUE and visual scanning to the left.  Max instructional cueing for scanning left of the sink for turning on the hot water as well as locating the toothbrush and toothpaste.  Mod instructional cueing as well to sequencing and setup toothbrush with toothpaste as well.  She was able to complete brushing her teeth with setup after toothpaste was applied using the RUE only.  Next, she complete toilet transfer to the 3:1 over the sink and grab bar on the right with max assist stand pivot.  She needed max assist for clothing management as well as hygiene sit to stand with mod assist for tearing off toilet paper using the LUE to assist.  She stood at the sink with max assist for washing hands as well with slight increased trunk flexion and pushing to the left.   Finished session with pt in the wheelchair with the call button and phone in reach and safety belt in place.  Encouraged pt to work on watching TV to the left as well as looking out the door on the left to locate people as they enter her room.     Therapy Documentation Precautions:  Precautions Precautions: Fall Precaution Comments: L hemipareisis, L inattention, mild pushing to L, L field of vision cut Restrictions Weight Bearing Restrictions: No  Pain: Pain Assessment Pain Scale: 0-10 Pain Score: 5  Pain Type: Acute pain Pain Location: Head Pain Orientation: Posterior Pain Descriptors / Indicators: Discomfort Pain Onset: With Activity Pain Intervention(s): Medication (See eMAR) ADL: See Care Tool Section for some details of mobility and selfcare   Therapy/Group: Individual Therapy  Nyna Chilton OTR/L 03/11/2021, 12:32 PM

## 2021-03-11 NOTE — Progress Notes (Signed)
Physical Therapy Session Note  Patient Details  Name: Tonya [name] MRN: [id number] Date of Birth: 1945-12-16  Today's Date: 03/11/2021 PT Individual Time: 7793-9030 and 0923-3007 PT Individual Time Calculation (min): 56 min  and 27 min   Short Term Goals: Week 2:  PT Short Term Goal 1 (Week 2): Pt will perform supine<>sit with mod assist PT Short Term Goal 2 (Week 2): Pt  will perform sit<>stands using LRAD with mod assist PT Short Term Goal 3 (Week 2): Pt will perform bed<>chair transfers using LRAD with mod assist PT Short Term Goal 4 (Week 2): Pt will ambulate at least 74ft using LRAD with +2 mod assist  Skilled Therapeutic Interventions/Progress Updates:    Session 1: Pt received sitting in w/c with her husband, Tonya [name], present and pt agreeable to therapy session. NT in/out for vital sign assessment. Pt discussing how she is getting an IV for fluids - thearpist educated pt on need for increased fluid intake, provided cup of water during session to encourage increased intake. Educated pt on importance of increased food consumption as NT reports pt has limited caloric intake for the day - therapist provided pt with cup of yogurt at end of session, pt noted to have difficulty managing spoon and placing it back in the yogurt cup when it was offset slightly to the L, educated her husband to assist with this. Donned B LE knee high TED hose and shoes max assist. Transported to/from gym in w/c for time management and energy conservation. Sit>stands w/c>3 Musketeer support with max assist of 1 and +2 min/mod assist for lifting to stand and balance - demos posterior lean with delayed hip/knee extension coming to stand; along with continuous R lateral trunk flexed posturing, therapist blocking L knee buckle, though pt demoing slightly increasing quad activation. Donned L LE DF assist ACE wrap for gait. Gait training 40ft, 52ft via 3 Musketeer support with +2 max assist - pt demos ability to  advance L LE during swing requiring min progressing to max assist towards end of 2nd walk due to fatigue (trialed L toe cap during 2nd walk to decrease friction during advancement, but no improvement noted likely due to fatigue), pt demos improving L glute/quad activation during stance requiring only mod assist to block knee buckle - therapist continuing to provide max cuing for sequencing LE stepping - demos significant R lateral trunk lean throughout gait with poor upright posturing despite facilitation and cuing for improvement. Pt continues to report she needs "to catch my breath" after each gait training trial. At end of session, therapist placed towel under R pelvis to promote L trunk lean/weight shift due to pt continuing to demo excessive R lateral trunk flexion at rest, will assess for improved posture during next session. Pt left sitting in w/c with needs in reach, seat belt alarm on, pt's husband present, and pt set-up to eat yogurt with family assistance.   Session 2: Pt received sitting in w/c with NT present and pt agreeable to therapy session. Pt reports need to use bathroom. Transported in/out in wheelchair. R stand pivot w/c>BSC over toilet using BSC armrests and grab bar with mod assist to lift into standing and balance while blocking L knee buckle, cuing for sequencing of steps and manual facilitation for L step. Standing with mod assist of 1 for balance and blocking L knee, while +2 assist provided mod assist for clothing management, pt able to use R UE to assist with clothes. Continent of bladder, total assist peri-care  in standing from +2 assist. L stand pivot BSC over toilet>w/c as described above, requires increased assist for stepping LLE in this direction. Sitting in w/c at sink, washed hands with max cuing to attend to L side and locate hot water faucet handle due to significant L inattention - therapist providing verbal cuing and manual facilitation to increase L UE incorporation into  task. Pt requesting to return to bed. L squat pivot w/c>EOB with mod assist of 1 for lifting/pivoting hips and repositioning L LE. Pt continues to demo poor awareness of bed positioning attempting to lie head towards foot of bed requiring max cuing to correct, due to L inattention. Sit>supine max assist for B LE management into bed. Pt left supine in bed with needs in reach, bed alarm on, and caregiver present.  Therapy Documentation Precautions:  Precautions Precautions: Fall Precaution Comments: L hemipareisis, L inattention, mild pushing to L, L field of vision cut Restrictions Weight Bearing Restrictions: No   Pain:  Session 1: No reports of pain throughout session.  Session 2: No reports of pain throughout session.   Therapy/Group: Individual Therapy  Tawana Scale , PT, DPT, NCS, CSRS 03/11/2021, 7:59 AM

## 2021-03-12 NOTE — Progress Notes (Signed)
Physical Therapy Session Note  Patient Details  Name: Tonya [name] MRN: [id number] Date of Birth: [birth year]  Today's Date: 03/12/2021 PT Individual Time: 0907-1003 PT Individual Time Calculation (min): 56 min   Short Term Goals: Week 2:  PT Short Term Goal 1 (Week 2): Pt will perform supine<>sit with mod assist PT Short Term Goal 2 (Week 2): Pt  will perform sit<>stands using LRAD with mod assist PT Short Term Goal 3 (Week 2): Pt will perform bed<>chair transfers using LRAD with mod assist PT Short Term Goal 4 (Week 2): Pt will ambulate at least 26ft using LRAD with +2 mod assist  Skilled Therapeutic Interventions/Progress Updates:    Pt received on [facility] with +2 NT assisting with finishing toileting. Pt's husband, Tonya [name], present. Pt agreeable to therapy session therefore therapist assumed care of pt. R squat pivot BSC>EOB with mod assist for pivoting hips - pt demos improving motor planning, initiation, and hip clearance during transfer. Sitting EOB with close supervision for sitting balance, due to R posterior lean, while thearpist donned TED hose and shoes max assist. Pt reports her therapy schedule did not have the times on it - therapist showed pt her schedule and drew a colored line on far L side next to the times to promote full L visual scanning to locate the times - pt with significant difficulty with this task. L squat pivot EOB>w/c with mod assist for lifting and pivoting hips - cuing for L visual scanning and location of chair prior to initiating transfer to improve motor planning and L attention. Transported to/from gym in w/c for time management and energy conservation.   Donned L LE ankle DF assist ACE wrap. Sit>stand w/c>Eva walker with heavy mod assist of 1 to lift into standing and maintain balance due to L hip shift and R shoulder lean (flexes trunk towards R while letting hips shift L) - despite cuing, limited ability to improve this posturing throughout gait training.  Gait training 66ft using Ethelene Hal with heavy mod assist of 1 for balance and managing L LE gait mechanics while +2 assist managed IV pole and forward movement of Eva walker - pt able to advance L LE during swing requiring min assist for increased step length and positioning, requires mod assist for increased L hip/knee extension during stance phase to block knee buckle - facilitation for R/L pelvic weight shift onto stance limb. +3 assist provided wheelchair at end of gait for increased pt safety.  Sitting in w/c>tall kneeling on mat with +2 max assist for balance and safety with total assist to manage L LE onto mat. In tall kneeling, with B UE support and mirror feedback focused on improved trunk/pelvis alignment to decreased R trunk flexion with L pelvic weight shift - max multimodal cuing and manual facilitation to achieve midline with pelvics and shoulders - mod assist for balance throughout. Pt only able to tolerate this position ~1 minute then becomes fatigued and short of breath. Tall kneeling>quadruped>R sidelying>sitting EOM with +2 mod assist for pt safety.  MD in/out for morning assessment. R squat pivot EOM>w/c with mod assist for pivoting hips as pt doesn't pivot fully landing halfway onto wheelchair. Transported back to room and left sitting in w/c with needs in reach, pillow placed on pt's R side to decrease R trunk lean, seat belt alarm on, and pt's husband present.    Therapy Documentation Precautions:  Precautions Precautions: Fall Precaution Comments: L hemipareisis, L inattention, mild pushing to L, L field of vision  cut Restrictions Weight Bearing Restrictions: No   Pain:  Reports 1x R anterior rib pain due to positioning of gait belt, repositioned for pain management with improvement.   Therapy/Group: Individual Therapy  Tawana Scale , PT, DPT, NCS, CSRS 03/12/2021, 7:48 AM

## 2021-03-12 NOTE — Progress Notes (Signed)
PROGRESS NOTE   Subjective/Complaints:  Seen in PT, fatigues easily but doing better today and yesterday  ROS: Patient denies CP, SOB, N/V/D  Objective:   No results found. Recent Labs    03/10/21 0600  WBC 7.5  HGB 11.4*  HCT 35.4*  PLT 253     Recent Labs    03/10/21 0600  NA 136  K 5.4*  CL 113*  CO2 15*  GLUCOSE 125*  BUN 57*  CREATININE 1.14*  CALCIUM 10.5*      Intake/Output Summary (Last 24 hours) at 03/12/2021 0932 Last data filed at 03/12/2021 0738 Gross per 24 hour  Intake 593.32 ml  Output --  Net 593.32 ml         Physical Exam: Vital Signs Blood pressure 111/67, pulse 89, temperature 98 F (36.7 C), temperature source Oral, resp. rate 17, height 5\' 4"  (1.626 m), weight 53.7 kg, SpO2 100 %.  General: No acute distress Mood and affect are appropriate Heart: Regular rate and rhythm no rubs murmurs or extra sounds Lungs: Clear to auscultation, breathing unlabored, no rales or wheezes Abdomen: Positive bowel sounds, soft nontender to palpation, nondistended Extremities: No clubbing, cyanosis, or edema  Skin: No evidence of breakdown, no evidence of rash Neurologic: Alert and oriented  motor strength is 5/5 in right and 3- Leftdeltoid, bicep, tricep, grip, trace hip flexor, knee extensors, ankle dorsiflexor and plantar flexor Sensory exam normal sensation to light touch RIght and absent left  upper and lower extremities Cerebellar exam unable to perform on left due to weakness Musculoskeletal: Full range of motion in all 4 extremities.  Has pain with palpation and tightness in upper traps, neck, occiput     Assessment/Plan: 1. Functional deficits which require 3+ hours per day of interdisciplinary therapy in a comprehensive inpatient rehab setting. Physiatrist is providing close team supervision and 24 hour management of active medical problems listed below. Physiatrist and rehab team  continue to assess barriers to discharge/monitor patient progress toward functional and medical goals  Care Tool:  Bathing    Body parts bathed by patient: Chest, Abdomen, Face, Left arm, Right upper leg, Left upper leg   Body parts bathed by helper: Left lower leg, Right lower leg, Front perineal area, Buttocks, Right arm Body parts n/a: Right upper leg, Left upper leg, Buttocks, Front perineal area, Left lower leg, Right lower leg (did not attempt)   Bathing assist Assist Level: 2 Helpers (sit to stand)     Upper Body Dressing/Undressing Upper body dressing   What is the patient wearing?: Pull over shirt    Upper body assist Assist Level: Moderate Assistance - Patient 50 - 74%    Lower Body Dressing/Undressing Lower body dressing      What is the patient wearing?: Pants     Lower body assist Assist for lower body dressing: Maximal Assistance - Patient 25 - 49%     Toileting Toileting    Toileting assist Assist for toileting: Maximal Assistance - Patient 25 - 49%     Transfers Chair/bed transfer  Transfers assist  Chair/bed transfer activity did not occur: Safety/medical concerns  Chair/bed transfer assist level: Moderate Assistance - Patient 50 -  74% (squat pivot)     Locomotion Ambulation   Ambulation assist   Ambulation activity did not occur: Refused  Assist level: 2 helpers Assistive device: Other (comment) (3 Musketeer) Max distance: 45   Walk 10 feet activity   Assist  Walk 10 feet activity did not occur: Refused  Assist level: 2 helpers Assistive device: Other (comment) (3 Musketeer)   Walk 50 feet activity   Assist Walk 50 feet with 2 turns activity did not occur: Safety/medical concerns         Walk 150 feet activity   Assist Walk 150 feet activity did not occur: Safety/medical concerns         Walk 10 feet on uneven surface  activity   Assist Walk 10 feet on uneven surfaces activity did not occur: Safety/medical  concerns         Wheelchair     Assist Will patient use wheelchair at discharge?: Yes Type of Wheelchair: Manual      Max wheelchair distance: 20 ft    Wheelchair 50 feet with 2 turns activity    Assist        Assist Level: Maximal Assistance - Patient 25 - 49%   Wheelchair 150 feet activity     Assist      Assist Level: Dependent - Patient 0%, Total Assistance - Patient < 25%   Blood pressure 111/67, pulse 89, temperature 98 F (36.7 C), temperature source Oral, resp. rate 17, height 5\' 4"  (1.626 m), weight 53.7 kg, SpO2 100 %.    Medical Problem List and Plan: 1. 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  Antithrombotics: -DVT/anticoagulation:  Mechanical: Sequential compression devices, below knee Bilateral lower extremities             -antiplatelet therapy: N/A 3. Headaches/Pain Management: Oxycodone prn for HA             --see below 4. Anxiety: LCSW to follow for evaluation and support. Recommended to continue to minimize use of Ativan due to negative effects of long term use on cognition.             -antipsychotic agents: N/A 5. Neuropsych: This patient is not capable of making decisions on her own behalf. 6. Skin/Wound Care: Routine pressure relief measures. D/c IV after magnesium supplementation as per patient request 7. Fluids/Electrolytes/Nutrition: Monitor I/O. Check CMET in am. 8. HTN: Monitor BP tid--currently on Lisinopril.Marland Kitchen --BP lower but no dizziness  -stop lisinopril and hold off on tizanidine as well  -BUN is up , poor fluid intake, IVF at noc  -TEDS Vitals:   03/11/21 2012 03/12/21 0400  BP: 106/66 111/67  Pulse: 91 89  Resp: 17 17  Temp: 98.6 F (37 C) 98 F (36.7 C)  SpO2: 100% 100%   BP  improving on IVF 7/6 9. Seizure prophylaxis: As per discharge summary, neurology recommended discontinuing Keppra in case it was contributing to hallucinations, but states that hallucinations were present prior to admission and patient has been tremulous/twitchy raising some concern for seizure. 6/19 to 6/20 showed no evidence of seizures. Consider discontinuing 6/25 one  week after ICH if remains seizure free. d/c'ed 6/27 10. Intermittent fevers: Being monitored and have resolved without treatment. 11. Anemia of chronic illness?: Continue to monitor H/H with serial checks--around 9 since 12/2020. 12. Hypomagnesemia: Resume home dose of supplement. IV supplementation on 6/25 13. Hypokalemia: Will add daily supplement.just added KCL  14. 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  Freq BM reduced senna S to 1 po qhs 16.   HA : likely vascular and tension/muscular -continue topamax 75mg  bid, will schedule sports cream -off tizanidine for hypotension  -oxycodone as needed -add lidocaine patches for neck/upper shoulders  17.  Pre renal azotemia, low CO2 , question effect of topirimate, will reduce dose and give IVF at noc, (low po intake) - will recheck BMET in am   LOS: 13 days A FACE TO [place] E Jayden Kratochvil 03/12/2021, 9:32 AM

## 2021-03-12 NOTE — Progress Notes (Signed)
Patient ID: Tonya [name], female   DOB: February 13, 1946, 75 y.o.   MRN: [id number] Team Conference Report to Patient/Family  Team Conference discussion was reviewed with the patient and caregiver, including goals, any changes in plan of care and target discharge date.  Patient and caregiver express understanding and are in agreement.  The patient has a target discharge date of 03/25/21.  SW called pt spouse at bedside. Provided conference updates. Followed up on status of ramp, spouse reports he has not reached out to a company yet and also mentioned additional ramp resources provided. SW informed pt spouse the need of the ramp at d/c. Pt spouse asked about a extension, sw informed spouse that this is based upon progress and insurance, as of now pt d/c date will remain. Spouse reports having no preferences in [facility]. SW will cont to follow up.   Dyanne Iha 03/12/2021, 1:59 PM

## 2021-03-12 NOTE — Progress Notes (Signed)
Patient ID: Tonya [name], female   DOB: 11-May-1946, 75 y.o.   MRN: [id number]  Family education scheduled on 03/18/2021 with patient daughters 1-4 PM  Erlene Quan, [place]

## 2021-03-12 NOTE — Progress Notes (Signed)
Occupational Therapy Session Note  Patient Details  Name: Tonya [name] MRN: [id number] Date of Birth: 1946/08/17  Today's Date: 03/12/2021 OT Individual Time: 1032-1059 OT Individual Time Calculation (min): 27 min    Short Term Goals: Week 2:  OT Short Term Goal 1 (Week 2): Pt will locate washcloth/soap on the left of the sink with no more than mod questioning cueing. OT Short Term Goal 2 (Week 2): Pt will complete UB dressing with mod assist following hemi techniques. OT Short Term Goal 3 (Week 2): Pt will complete LB bathing with mod assist sit to stand. OT Short Term Goal 4 (Week 2): Pt will donn pull up pants sit to stand with mod assist. OT Short Term Goal 5 (Week 2): Pt will complete squat pivot transfer to the drop arm commode with mod assist.  Skilled Therapeutic Interventions/Progress Updates:    Pt received seated in w/c with spouse present, agreeable to therapy. C/o R lateral rib pain. Session focus on LUE NMR, midline orientation, and L visual scanning in prep for improved ADL performance. RN present to disconnect IV. Total w/c transport to and from gym 2/2 time constraints. Completed 1x15 L shoulder flexion/elbow extension with light min A to facilitate full elbow ROM against tilted stool. Additionally, completing 1x15 L shoulder ER/IR via towel slides, but pt fatiguing quickly and putting forth minimal effort. Min A throughout to maintain midline orientation and to facilitate upright posture 2/2 R lean in w/c. Squat-pivot back to bed with mod A to lift and pivot hips, mod Vcs for technique. Mod A to return to supine to progress BLE onto bed. Max A + 2 to boost up comfortably.  Pt left semi-reclined in bed with bed alarm engaged, spouse present, call bell in reach, and all immediate needs met.    Therapy Documentation Precautions:  Precautions Precautions: Fall Precaution Comments: L hemipareisis, L inattention, mild pushing to L, L field of vision  cut Restrictions Weight Bearing Restrictions: No  Pain:  See session note ADL: See Care Tool for more details.  Therapy/Group: Individual Therapy  Volanda Napoleon MS, OTR/L  03/12/2021, 6:47 AM

## 2021-03-12 NOTE — Progress Notes (Signed)
Patient ID: Tonya [name], female   DOB: 09-29-1945, 75 y.o.   MRN: [id number]  Family education scheduled on 03/17/2021 with pt spouse [place], [place]

## 2021-03-12 NOTE — Progress Notes (Signed)
Occupational Therapy Session Note  Patient Details  Name: Tonya [name] MRN: [id number] Date of Birth: [birth year]  Today's Date: 03/12/2021 OT Individual Time: 3500-9381 OT Individual Time Calculation (min): 62 min    Short Term Goals: Week 2:  OT Short Term Goal 1 (Week 2): Pt will locate washcloth/soap on the left of the sink with no more than mod questioning cueing. OT Short Term Goal 2 (Week 2): Pt will complete UB dressing with mod assist following hemi techniques. OT Short Term Goal 3 (Week 2): Pt will complete LB bathing with mod assist sit to stand. OT Short Term Goal 4 (Week 2): Pt will donn pull up pants sit to stand with mod assist. OT Short Term Goal 5 (Week 2): Pt will complete squat pivot transfer to the drop arm commode with mod assist.  Skilled Therapeutic Interventions/Progress Updates:    Pt finishing toileting with NT when therapist entered, already in the [place].  Utilized this for transfer over to the tub bench with max demonstrational cueing to place her left hand up on the bar of the [place] and maintain during transition to the shower. Once in the shower, she needed max assist for removal of all clothing secondary to visual perceptual and motor planning deficits.  She was then able to complete bathing with min assist for UB and max assist for LB.  She needed max instructional cueing with mod assist to incorporate the LUE for washing her RUE as well as the left upper leg and part of the left lower leg.  Mod assist for sit to stand with max assist to maintain standing while completing peri washing on two attempts.  She dried off and completed stand pivot transfer to the wheelchair with max assist in order to work on dressing tasks at the sink.  Max assist for donning all clothing as she demonstrates decreased ability to sequence task following hemi dressing techniques.  Significant left visual field deficit and visual perceptual deficits make it difficult for her to  identify parts of the shirt or pants at this time.  Finished session with pt sitting up in the wheelchair working on eating her lunch.  Call button and phone in reach with safety belt in place.     Therapy Documentation Precautions:  Precautions Precautions: Fall Precaution Comments: L hemipareisis, L inattention, mild pushing to L, L field of vision cut Restrictions Weight Bearing Restrictions: No  Pain: Pain Assessment Pain Scale: Faces Pain Score: 0-No pain ADL: See Care Tool Section for some details of mobility and selfcare   Therapy/Group: Individual Therapy  Avalynne Diver OTR/L 03/12/2021, 4:07 PM

## 2021-03-12 NOTE — Patient Care Conference (Signed)
Inpatient RehabilitationTeam Conference and Plan of Care Update Date: 03/12/2021   Time: 10:32 AM    Patient Name: DOROTEA HAND      Medical Record Number: [id number]  Date of Birth: 16-Nov-1945 Sex: Female         Room/Bed: 4W08C/4W08C-01 Payor Info: Payor: [employer] MEDICARE / Plan: [place] HMO / Product Type: *No Product type* /    Admit Date/Time:  02/27/2021  4:16 PM  Primary Diagnosis:  Intraparenchymal hematoma of brain [facility])  Hospital Problems: Principal Problem:   Intraparenchymal hematoma of brain [facility])    Expected Discharge Date: Expected Discharge Date: 03/25/21  Team Members Present: Physician leading conference: Dr. Alysia Penna Care Coodinator Present: Dorien Chihuahua, RN, BSN, CRRN;Christina Dawson, [place] Nurse Present: Dorien Chihuahua, RN PT Present: Page Spiro, PT OT Present: Clyda Greener, OT SLP Present: Other (comment) Herbert Deaner, SLP) PPS Coordinator present : Gunnar Fusi, SLP     Current Status/Progress Goal Weekly Team Focus  Bowel/Bladder   Pt is continent of bowel/bladder  Pt will remain continent of bowel/bladder  Will assess qshift and PRN   Swallow/Nutrition/ Hydration             ADL's   Min asssit for UB bathing with mod to max for UB dressing and max assist for LB bathing and dressing.  Mod assist for squat pivot transfers with max assist for stand pivots to the 3:1.  Still with left neglect and left visual field deficit.  min assist overall  selfcare retraining, visual compensation, balance retraining, transfer training, DME education, cognitive retraining, pt education,   Mobility   mod/max assist bed mobility, mod assist squat pivot transfers, mod/max assist sit<>stand, +2 max assist gait up to 30ft via 3 Musketeer support - functional mobility continues to be impacted by significant L inattention, impaired awareness, impaired motor planning, and L hemiparesis (noted some improvement in L hemibody movement)   CGA/min assist overall at limited ambulatory level with supervision w/c mobility - will monitor if needing to downgrade  activity tolerance, pt/family education, L attention, dynamic sitting and standing balance, L LE NMR, transfer training, bed mobility training, gait training   Communication             Safety/Cognition/ Behavioral Observations  Mod-to-max A, patient fatigues quickly and has limited sustained attention. When she reaches her cognitive threshold she appears to shut down.  Min A for memory, attention, problem solving  sustained attention, functional problem solving, short-term recall   Pain   Patient complains of migraine  Patient's pain will be on a scale of 0-10 a 0  Will assess qshift and PRN   Skin   Pt's skin is intact  Pt's skin will remain intact  Will assess qshift and PRN     Discharge Planning:  D/c home with spouse and daughters to provide care, MIN A , 24/7   Team Discussion: IVF for dehydration at HS. Patient with hx of CA and fatigues easily. Also noted HA; MD adjusting medications. Patient's progress limited by weakness, left inattention, left neglect, fatigue and motor planning issues. Also note right lateral trunk leaning and pushing with activities.  Patient on target to meet rehab goals: Currently mod - max assist for upper body bathing and dressing and max assist for lower body dressing. Mod assist for squat pivot transfers and mod - max for sit to stand due to pushing. Mod - max assist for cognitive tasks.   *See Care Plan and progress notes for long and  short-term goals.   Revisions to Treatment Plan:  Working on safety, short term recall, attention, Trials with Multimedia programmer and 3 musketeer style ambulation Downgraded goals to min assist overall  Teaching Needs: Safety, transfers, toileting, medications, secondary stroke risk management, etc.  Current Barriers to Discharge: Decreased caregiver support and Home enviroment access/layout  Possible  Resolutions to Barriers: Family education Recommend ramp for entry to home Custom wheelchair for discharge     Medical Summary Current Status: dehydration elevated BUN, lower BP, fatigue  Barriers to Discharge: Medical stability   Possible Resolutions to Barriers/Weekly Focus: IV hydration recheck BMET, lower topiramate   Continued Need for Acute Rehabilitation Level of Care: The patient requires daily medical management by a physician with specialized training in physical medicine and rehabilitation for the following reasons: Direction of a multidisciplinary physical rehabilitation program to maximize functional independence : Yes Medical management of patient stability for increased activity during participation in an intensive rehabilitation regime.: Yes Analysis of laboratory values and/or radiology reports with any subsequent need for medication adjustment and/or medical intervention. : Yes   I attest that I was present, lead the team conference, and concur with the assessment and plan of the team.   Dorien Chihuahua B 03/12/2021, 10:55 AM

## 2021-03-12 NOTE — Progress Notes (Signed)
Speech Language Pathology Daily Session Note  Patient Details  Name: Tonya [name] MRN: [id number] Date of Birth: 1946-07-10  Today's Date: 03/12/2021 SLP Individual Time: 1300-1400 SLP Individual Time Calculation (min): 60 min  Short Term Goals: Week 2: SLP Short Term Goal 1 (Week 2): Patient will recall and describe specific therapeutic interventions with PT/OT/SLP with modA cues. SLP Short Term Goal 2 (Week 2): Patient will demonstrate sustained attention to basic level problem solving tasks with modA cues. SLP Short Term Goal 3 (Week 2): Patient will demonstrate adequate reasoning and problem solving when completed simulated functional tasks with modA cues. SLP Short Term Goal 4 (Week 2): Patient will demonstrate consistent emergent awareness to deficits and limitations, with modA -cues. SLP Short Term Goal 5 (Week 2): Patient will perform basic-mild complex organizing and/or sequencing tasks with min A cues.  Skilled Therapeutic Interventions: Skilled ST intervention was performed with focus on cognitive goals. Patient was more alert today with no complaints of headache which has been limiting in the past. She requested to go outside during session. Assisted patient with transferring from bed-to-wheelchair. She demonstrated recall of 3 safe transfer techniques with min A for effectiveness (i.e., 1. Wear footwear/socks with grips on the bottom, 2. Transfer to strong side, 3. Ensure both feet are firm on ground prior to standing). Reviewed these safety strategies in more detail with spouse. Patient was taken outside in wheelchair and required mod A verbal cues to scan left to find her water cup on arm tray, and identifying different sights around courtyard within left visual field (i.e., bench, flowers, water fountain, entrance). SLP facilitated verbal reasoning task with min A for awareness and problem solving. Patient was brought back to room and left in wheelchair with alarm activated  and needs within reach at end of session. Spouse present at bedside. Plan to continue per ST POC.     Pain Pain Assessment Pain Scale: 0-10 Pain Score: 0-No pain Pain Type: Acute pain Pain Location: Neck Pain Orientation: Posterior Pain Intervention(s): Medication (See eMAR)  Therapy/Group: Individual Therapy  [name] 03/12/2021, 1:55 PM

## 2021-03-13 LAB — BASIC METABOLIC PANEL
Anion gap: 9 (ref 5–15)
BUN: 43 mg/dL — ABNORMAL HIGH (ref 8–23)
CO2: 16 mmol/L — ABNORMAL LOW (ref 22–32)
Calcium: 10.1 mg/dL (ref 8.9–10.3)
Chloride: 110 mmol/L (ref 98–111)
Creatinine, Ser: 0.85 mg/dL (ref 0.44–1.00)
GFR, Estimated: >60 mL/min (ref >60)
Glucose, Bld: 109 mg/dL — ABNORMAL HIGH (ref 70–99)
Potassium: 4.3 mmol/L (ref 3.5–5.1)
Sodium: 135 mmol/L (ref 135–145)

## 2021-03-13 NOTE — Progress Notes (Signed)
Occupational Therapy Session Note  Patient Details  Name: Tonya [name] MRN: [id number] Date of Birth: May 06, 1946  Today's Date: 03/13/2021 OT Individual Time: 0901-1000 OT Individual Time Calculation (min): 59 min    Short Term Goals: Week 2:  OT Short Term Goal 1 (Week 2): Pt will locate washcloth/soap on the left of the sink with no more than mod questioning cueing. OT Short Term Goal 2 (Week 2): Pt will complete UB dressing with mod assist following hemi techniques. OT Short Term Goal 3 (Week 2): Pt will complete LB bathing with mod assist sit to stand. OT Short Term Goal 4 (Week 2): Pt will donn pull up pants sit to stand with mod assist. OT Short Term Goal 5 (Week 2): Pt will complete squat pivot transfer to the drop arm commode with mod assist.  Skilled Therapeutic Interventions/Progress Updates:    Pt in bed to start session with spouse entering room at the same time as therapist.  Pt had not eaten breakfast and demonstrated some confusion with regards to that was her sister's breakfast as they won't give her bacon.  Her sister is coming tomorrow to visit but has not been here recently.  Re-directed pt and completed transfer from supine to sit with mod assist.  She then completed transfer squat/stand pivot to the wheelchair with mod assist secondary to increased pushing to the left when attempting to pivot to the right.  Took pt down to the tub room with spouse present for beginning education on DME needs for the shower.  Pt has a walk-in shower but it has doors on it and she will not be able to step into the shower at this time.  Recommended use of a tub bench in the shower tub instead as it has a curtain.  She was able to complete transfer into and out of the shower with max assist, squat/stand pivot.  Pt continues to try and stand and step when turning to the right secondary to pusher tendencies but can complete squat pivot to the left appropriately with mod assist.  Discussed  need for a tub bench at home currently as well as a drop arm commode.  Pt and spouse are in agreement with this as well.  Next had her work on standing balance and visual scanning with use of the BITS.  She was able to stand for intervals of 2 mins with max assist overall while using the non-involved RUE to locate and press the blue dots on the screen.  She continues to need mod instructional cueing for scanning to the left of midline to locate dots but did improve from trial of this last week.  Today she demonstrated an average reaction time of 4.09 seconds and 2.6 seconds when given mod instructional cueing.  Increased posterior lean and pushing to the left noted in standing with max facilitation at the left knee to maintain extension.  Returned to the room at the end of the session with pt left sitting up in the wheelchair with the call button and phone in reach as well as safety belt in place.    Therapy Documentation Precautions:  Precautions Precautions: Fall Precaution Comments: L hemipareisis, L inattention, mild pushing to L, L field of vision cut Restrictions Weight Bearing Restrictions: No   Pain: Pain Assessment Pain Scale: 0-10 Pain Score: 7  Faces Pain Scale: Hurts a little bit Pain Type: Acute pain Pain Location: Neck Pain Orientation: Posterior Pain Descriptors / Indicators: Discomfort Pain Frequency: Constant Pain  Onset: On-going Patients Stated Pain Goal: 3 Pain Intervention(s): Medication (See eMAR) ADL: See Care Tool Section for some details of mobility and selfcare  Therapy/Group: Individual Therapy  Tonya [name] OTR/L 03/13/2021, 12:10 PM

## 2021-03-13 NOTE — Progress Notes (Signed)
Speech Language Pathology Daily Session Note  Patient Details  Name: Tonya [name] MRN: [id number] Date of Birth: 02-13-1946  Today's Date: 03/13/2021 SLP Individual Time: 1015-1100 SLP Individual Time Calculation (min): 45 min  Short Term Goals: Week 2: SLP Short Term Goal 1 (Week 2): Patient will recall and describe specific therapeutic interventions with PT/OT/SLP with modA cues. SLP Short Term Goal 2 (Week 2): Patient will demonstrate sustained attention to basic level problem solving tasks with modA cues. SLP Short Term Goal 3 (Week 2): Patient will demonstrate adequate reasoning and problem solving when completed simulated functional tasks with modA cues. SLP Short Term Goal 4 (Week 2): Patient will demonstrate consistent emergent awareness to deficits and limitations, with modA -cues. SLP Short Term Goal 5 (Week 2): Patient will perform basic-mild complex organizing and/or sequencing tasks with min A cues.  Skilled Therapeutic Interventions: Skilled ST intervention performed with focus on cognitive goals. Patient was accompanied by her spouse this date. SLP facilitated basic categorical naming/verbal reasoning task (e.g., tell me 5 things you would find in your bathroom, things you need to take a shower). Patient completed with Mod-to-Max A verbal and visual cues (dry erase board for recall of what was previously said) for processing, sustained attention, and alternating between one topic to the next, as patient was known to perseverate on previously discussed categories. At the end SLP asked patient what she thought about the task and she stated "that was really easy" and did not exhibit awareness that she required substantial support from SLP to complete. After discussion, patient eventually acknowledged that the task was more challenging than she thought it would be and she feels embarrassed at times when she requires assistance on things that "should be easy". Educated patient and  spouse on cognitive-communication deficits and strategies to promote improved attention and processing, including reducing environmental stimuli, allowing additional processing time, using written cues for reminders, taking intermittent breaks as needed. Patient was left in her chair with alarm activated and needs within reach. Continue per ST POC.    Pain Pain Assessment Pain Scale: 0-10 Pain Score: 7  Pain Type: Acute pain Pain Location: Neck Pain Orientation: Posterior Pain Descriptors / Indicators: Discomfort Pain Frequency: Constant Pain Onset: On-going Patients Stated Pain Goal: 3 Pain Intervention(s): Medication (See eMAR)  Therapy/Group: Individual Therapy  [name] 03/13/2021, 10:58 AM

## 2021-03-13 NOTE — Progress Notes (Signed)
Physical Therapy Weekly Progress Note  Patient Details  Name: Tonya [name] MRN: [id number] Date of Birth: 1946/08/17  Beginning of progress report period: March 06, 2021 End of progress report period: March 13, 2021  Today's Date: 03/13/2021 PT Individual Time: 3646-8032 PT Individual Time Calculation (min): 54 min   Patient has met 4 of 4 short term goals.  Tonya [name] has demonstrated significant improvement in her mood and participation with therapy the past 3 days allowing further advancement of interventions and increasing independence with functional mobility tasks. She continues to demonstrate L inattention, L hemiparesis, impaired awareness, poor motor planning, and impaired balance contributing to decreased functional mobility level. She performs supine<>sit with mod assist, sit<>stands with mod assist, squat pivot transfers with mod assist,and has advanced to gait training up to 41f via 3 Musketeer support and +2 mod assist. She demonstrates increasing activation in L hemibody during functional mobility tasks. She will benefit from continued CIR level therapist to further advance functional mobility status prior to D/C home with 24hr support from family.  Patient continues to demonstrate the following deficits muscle weakness and muscle paralysis, decreased cardiorespiratoy endurance, impaired timing and sequencing, abnormal tone, unbalanced muscle activation, motor apraxia, decreased coordination, and decreased motor planning, decreased midline orientation and decreased attention to left, decreased initiation, decreased attention, decreased awareness, decreased problem solving, decreased safety awareness, decreased memory, and delayed processing, peripheral, and decreased sitting balance, decreased standing balance, decreased postural control, hemiplegia, and decreased balance strategies and therefore will continue to benefit from skilled PT intervention to increase functional  independence with mobility.  Patient progressing toward long term goals..  Continue plan of care.  PT Short Term Goals Week 2:  PT Short Term Goal 1 (Week 2): Pt will perform supine<>sit with mod assist PT Short Term Goal 1 - Progress (Week 2): Met PT Short Term Goal 2 (Week 2): Pt  will perform sit<>stands using LRAD with mod assist PT Short Term Goal 2 - Progress (Week 2): Met PT Short Term Goal 3 (Week 2): Pt will perform bed<>chair transfers using LRAD with mod assist PT Short Term Goal 3 - Progress (Week 2): Met PT Short Term Goal 4 (Week 2): Pt will ambulate at least 555fusing LRAD with +2 mod assist PT Short Term Goal 4 - Progress (Week 2): Met Week 3:  PT Short Term Goal 1 (Week 3): Pt will perform supine<>sit with min assist PT Short Term Goal 2 (Week 3): Pt will perform sit<>stands using LRAD with min assist PT Short Term Goal 3 (Week 3): Pt will perform bed<>chair transfers using LRAD with min asist PT Short Term Goal 4 (Week 3): Pt will ambulate at least 5095fsing LRAD with mod assist of 1  Skilled Therapeutic Interventions/Progress Updates:  Ambulation/gait training;Balance/vestibular training;Cognitive remediation/compensation;Community reintegration;Discharge planning;Disease management/prevention;DME/adaptive equipment instruction;Functional electrical stimulation;Functional mobility training;Neuromuscular re-education;Pain management;Patient/family education;Psychosocial support;Splinting/orthotics;Stair training;Therapeutic Activities;Therapeutic Exercise;UE/LE Strength taining/ROM;UE/LE Coordination activities;Visual/perceptual remediation/compensation;Wheelchair propulsion/positioning;Skin care/wound management   Pt received sitting in w/c and agreeable to therapy session. Pt reports she thinks someone is lying on her bed stating she has been worried about them because she couldn't tell if they were breathing, pt is referring to a pile of blankets -  therapist made her  aware of the blankets. Pt noted to have scooted L hip forward in chair requiring cuing to scoot back and maintain safe positioning in the seat. Donned B LE TED hose and shoes max assist.  Transported to/from gym in w/c for time management and energy conservation.  Donned L LE ankle DF assist ACE wrap. Sit>stands w/c>R HHA & L UE around therapist's shoulders with light mod assist of 1 for lifting to stand and balance due to delayed trunk/hip extension causing posterior lean. Gait training 43f, 240fvia 3 Musketeer support with +2 mod assist for balance and L LE management - requires mod/max assist to advance L LE during swing and then min/mod assist to facilitate hip/knee extension during stance - cuing for reciprocal stepping pattern. Pt demos improved upright trunk posture with decreased R lateral trunk lean today as well as improving knee control during stance.  Sitting in w/c<>tall kneeling on mat table with +2 mod assist for balance and max facilitation for L LE management onto and off of mat. In tall kneeling, with varying R or L UE support on bench performed dynamic reaching task to promote improved upright posture for midline orientation and increased hip extension - min/mod assist of 1 for balance with +2 assist for reaching task. Facilitation of L UE into reaching task to promote NMR.   Transported back to room and pt's family member present - pt left seated in w/c with needs in reach and seat belt alarm on.  Therapy Documentation Precautions:  Precautions Precautions: Fall Precaution Comments: L hemipareisis, L inattention, mild pushing to L, L field of vision cut Restrictions Weight Bearing Restrictions: No   Pain: No reports of pain throughout session.  Therapy/Group: Individual Therapy  CaTawana Scale PT, DPT, NCS, CSRS 03/13/2021, 12:52 PM

## 2021-03-13 NOTE — Progress Notes (Signed)
Patient ID: Tonya [name], female   DOB: 09-30-1945, 75 y.o.   MRN: [id number]  Family education scheduled, Wednesday 7/12 1-4 PM  [place], [place]

## 2021-03-13 NOTE — Progress Notes (Signed)
Occupational Therapy Note  Patient Details  Name: Tonya [name] MRN: [id number] Date of Birth: Sep 07, 1946    Attempted to see patient for additional therapy time this afternoon due to schedule changes - she stated that she was frustrated with her family and attempting to reach them via phone at this time - declines therapy at this time.     Carlos Levering 03/13/2021, 3:46 PM

## 2021-03-14 DIAGNOSIS — R7989 Other specified abnormal findings of blood chemistry: Secondary | ICD-10-CM

## 2021-03-14 NOTE — Progress Notes (Signed)
Speech Language Pathology Weekly Progress and Session Note  Patient Details  Name: Tonya [name] MRN: [id number] Date of Birth: 1945/11/20  Beginning of progress report period: March 07, 2021 End of progress report period: March 14, 2021  Today's Date: 03/14/2021 SLP Individual Time: 1740-8144 SLP Individual Time Calculation (min): 60 min  Short Term Goals: Week 2: SLP Short Term Goal 1 (Week 2): Patient will recall and describe specific therapeutic interventions with PT/OT/SLP with modA cues. SLP Short Term Goal 1 - Progress (Week 2): Met SLP Short Term Goal 2 (Week 2): Patient will demonstrate sustained attention to basic level problem solving tasks with modA cues. SLP Short Term Goal 2 - Progress (Week 2): Met SLP Short Term Goal 3 (Week 2): Patient will demonstrate adequate reasoning and problem solving when completed simulated functional tasks with modA cues. SLP Short Term Goal 3 - Progress (Week 2): Not met SLP Short Term Goal 4 (Week 2): Patient will demonstrate consistent emergent awareness to deficits and limitations, with modA -cues. SLP Short Term Goal 4 - Progress (Week 2): Met SLP Short Term Goal 5 (Week 2): Patient will perform basic-mild complex organizing and/or sequencing tasks with min A cues. SLP Short Term Goal 5 - Progress (Week 2): Not met   New Short Term Goals: Week 3: SLP Short Term Goal 1 (Week 3): Patient will recall and describe specific therapeutic interventions with PT/OT/SLP with min-to-mod A cues. SLP Short Term Goal 2 (Week 3): Patient will demonstrate adequate reasoning and problem solving when completed simulated functional tasks with modA cues. SLP Short Term Goal 3 (Week 3): Patient will demonstrate consistent emergent awareness to deficits and limitations, with min-to-modA -cues. SLP Short Term Goal 4 (Week 3): Patient will perform basic-mild complex organizing and/or sequencing tasks with min-to-mod A cues.  Weekly Progress Updates: Patient  has made improvement with cognitive-linguistic skills this reporting period and has met 3 of 5 STGs. Her performance continues to fluctuate day-to-day and continues to be limited by lethargy/sleepiness at times. However, overall she exhibits improving alertness and sustained attention with less interfering headaches which has enhanced meaningful participation. She fluctuates between min-to-mod A for basic problem solving, reasoning, and sequencing tasks, and at times requires mod-to-max assist for mildly complex problem solving tasks, and short-term recall tasks. More support from SLP is necessary toward the end of her treatment sessions likely attributed to fatigue, as she continues to exhibit limited cognitive endurance and appears to fatigue easily, although this is an area that has improved greatly within the last week. She is inconsistently oriented to time and timeline of events relating to her hospitalization and benefits from orientation visuals placed on her wall in room. Lastly, she has made gains in awareness of her deficits and emerging awareness although this is inconsistent during session and day-to-day. Patient and family education is ongoing and patient would continue to benefit from skilled ST services to further maximize cognition and functional independence.    Intensity: Minumum of 1-2 x/day, 30 to 90 minutes Frequency: 3 to 5 out of 7 days Duration/Length of Stay: 7/19 Treatment/Interventions: Cognitive remediation/compensation;Dysphagia/aspiration precaution training;Internal/external aids;Medication managment;Environmental controls;Cueing hierarchy;Patient/family education;Functional tasks   Daily Session  Skilled Therapeutic Interventions: Patient agreeable to skilled ST intervention with focus on cognitive goals. Patient required mod A verbal cues to orient to time and details relating to the timeline of her stroke/hospitalization. SLP provided patient with a visual calendar of  July and utilized during session to re-direct when she exhibited confusion with dates. Placed  calendar on wall in room. Facilitated verbal sequencing task (steps involved in making fried chicken) with min A verbal cues for re-direction, attention, and thought organization as patient occasionally repeated herself and became distracted mid-thought. Provided patient with 4-steps for brushing teeth and asked her to organize steps in appropriate sequential order. Patient completed with mod-to-max A verbal and visual cues including field of 2 choices and consistent verbal repetition. Suspect performance and difficulty on this task attributed to cognitive fatigue and sleepiness. Patient was left in bed with alarm activated and needs within reach at end of session. Continue per ST plan of care.  General    Pain Pain Assessment Pain Scale: 0-10 Pain Score: 6  Pain Location: Head Pain Descriptors / Indicators: Headache  Therapy/Group: Individual Therapy  [name] 03/14/2021, 5:19 PM

## 2021-03-14 NOTE — Progress Notes (Signed)
Physical Therapy Session Note  Patient Details  Name: Tonya [name] MRN: [id number] Date of Birth: 05/31/1946  Today's Date: 03/14/2021 PT Individual Time: 0808-0905 PT Individual Time Calculation (min): 57 min   Short Term Goals: Week 2:  PT Short Term Goal 1 (Week 2): Pt will perform supine<>sit with mod assist PT Short Term Goal 1 - Progress (Week 2): Met PT Short Term Goal 2 (Week 2): Pt  will perform sit<>stands using LRAD with mod assist PT Short Term Goal 2 - Progress (Week 2): Met PT Short Term Goal 3 (Week 2): Pt will perform bed<>chair transfers using LRAD with mod assist PT Short Term Goal 3 - Progress (Week 2): Met PT Short Term Goal 4 (Week 2): Pt will ambulate at least 50ft using LRAD with +2 mod assist PT Short Term Goal 4 - Progress (Week 2): Met Week 3:  PT Short Term Goal 1 (Week 3): Pt will perform supine<>sit with min assist PT Short Term Goal 2 (Week 3): Pt will perform sit<>stands using LRAD with min assist PT Short Term Goal 3 (Week 3): Pt will perform bed<>chair transfers using LRAD with min asist PT Short Term Goal 4 (Week 3): Pt will ambulate at least 50ft using LRAD with mod assist of 1  Skilled Therapeutic Interventions/Progress Updates:  Patient supine in bed on entrance to room. Patient alert and agreeable to PT session, but relates migraine pain and low back pain at start of session that she believes may limit her ability to participate during session. TED hose, socks and shoes donned Total A for time.  Therapeutic Activity: Bed Mobility: Patient performed supine --> sit with supervision/ CGA. VC/ tc required for technique and effort. At end of session, pt returns to supine with CGA for LLE. VC for effort and final positioning. Transfers: lateral scoot/ squat pivot transfer to R from bed --> w/c  perofrmed with CGA/ light Min A. Pt quizzed prior to standing transfer performance on proper technique. Unable to relate safe steps for proper technique.  Provided with visual demonstration and verbal instruction. With repeated transfers, pt is able to relate more steps. She performed STS transfers throughout session with close supervision/ intermittent CGA and vc for maintaining effort throughout. SPVT transfers throughout session perofrmed with CGA requiring no block to L knee. Provided vc for technique and safety throughout session.  Neuromuscular Re-ed: NMR facilitated during session with focus on standing balance and LLE neuromuscular facilitation and activiation. Pt guided in standing balance training with and without UE support. Progressing to standing with no UE support and adjustment of posture to improve upright neutral spine. No LOB noted.  Pt guided in standing L hip/ knee flexion and touchdown of L foot to called location on floor. Demos good weight shift to equal weight bearing with BLE on floor and good shift to RLE when lifting LLE. Pt standing with BUE support to back of armchair in therapy gym.   Pt relates that she has been holding on to a fear of falling but notes that her balance is improving. Willing to attempt ambulation.   NMR performed for improvements in motor control and coordination, balance, sequencing, judgement, and self confidence/ efficacy in performing all aspects of mobility at highest level of independence.   Gait Training:  Patient ambulated 18' x1 including one 90 deg turn in longer bout using RW with CGA/ Min A. Demonstrated improved upright posture, improved LLE step clearance, improving ability to manage walker throughout. Assist required with turn and managing   walker with catch to bed rail. Provided vc/ tc initially for sequencing, but pt able to pick up quickly and requires less vc by the time she reaches goal distance. Intermittent vc for step height/ length, overall technique.  Patient supine  in bed at end of session with brakes locked, bed alarm set, husband present, and all needs within reach.  Therapy  Documentation Precautions:  Precautions Precautions: Fall Precaution Comments: L hemipareisis, L inattention, mild pushing to L, L field of vision cut Restrictions Weight Bearing Restrictions: No  Therapy/Group: Individual Therapy  Julie A Kraus PT, DPT 03/14/2021, 4:26 PM  

## 2021-03-14 NOTE — Progress Notes (Signed)
Occupational Therapy Weekly Progress Note  Patient Details  Name: Tonya [name] MRN: [id number] Date of Birth: [birth year]  Beginning of progress report period: March 08, 2021 End of progress report period: March 14, 2021  Today's Date: 03/14/2021 OT Individual Time: 1002-1107 OT Individual Time Calculation (min): 65 min    Patient has met 2 of 5 short term goals.  Pt is making slow but stedy progress with OT at this time.  She currently completes UB bathing at min assist level with UB dressing at max assist level.  LB bathing and dressing are also at max assist level secondary to increased pushing to the left as well as decreased motor planning and visual perceptual issues.  She continues to need mod assist for squat pivot transfers with max assist for stand pivot transfers.  LUE functional use is at a Brunnstrum stage V level, however she continues to demonstrate decreased proprioception requiring mod instructional cueing at times to integrate into functional tasks.  When using the UE, she is able to complete bathing tasks with min to mod assist and can use is as a stabilizer when holding items to be opened with supervision.  Tonya [name] still exhibits left neglect as well as left visual field deficits as well requiring mod instructional/demonstrational cueing to scan left of midline for locating grooming and selfcare items on the left.  Motor planning deficits are also present but they continue to improve. Feel she is on track to meet overall min assist level goals for expected discharge on 7/19.  Recommend continued CIR level therapy to reach these.  Patient continues to demonstrate the following deficits: muscle weakness and muscle paralysis, impaired timing and sequencing, unbalanced muscle activation, motor apraxia, decreased coordination, and decreased motor planning, decreased visual perceptual skills and field cut, decreased attention to left and left side neglect, decreased  initiation, decreased attention, decreased awareness, decreased problem solving, decreased memory, and delayed processing, and decreased sitting balance, decreased standing balance, decreased postural control, hemiplegia, and decreased balance strategies and therefore will continue to benefit from skilled OT intervention to enhance overall performance with BADL and Reduce care partner burden.  Patient progressing toward long term goals..  Continue plan of care.  OT Short Term Goals Week 3:  OT Short Term Goal 1 (Week 3): Pt will complete UB dressing with mod assist following hemi techniques. OT Short Term Goal 2 (Week 3): Pt will complete LB bathing with mod assist sit to stand. OT Short Term Goal 3 (Week 3): Pt will donn pull up pants sit to stand with mod assist.  Skilled Therapeutic Interventions/Progress Updates:    Pt in bed to start with her spouse present.  She needed mod encouragement to participate secondary to report of slight headache.  Therapist offered emotional support and encouragement and pt was in agreement to participate.  She completed supine to sit EOB with mod assist and then worked on donning her shoes at EOB.  Increased posterior pelvic tilt was noted in sitting with mod assist to maintain anterior tilt and avoid posterior lean.  She needed mod assist for donning the left shoe with max assist for the right.  Total assist was needed for tying both of them.  She then completed stand pivot transfer to the wheelchair at max assist, for transport down to the therapy gym.  She then transferred to the mat at the same level.  Worked on sit to stand and standing balance during session with emphasis on upright posture and left knee  and hip sustained activation.  Pt is able to complete sit to stand with mod assist overall but needs mod to max assist to maintain secondary to left knee buckling and pushing to the left.  She demonstrates increased fear in standing without UE support resulting in  posterior bias and heavier pushing to the weaker side.  Mod facilitation with max demonstrational cueing was needed for her to activate the left knee and hip extensors, but secondary to motor impersistence, she was not able to sustain activation.  Incorporated functional reach with the LUE while standing to place playing cards on the mirror and complete weightshifts to the right to help decrease pushing tendencies. Motor planning deficits noted when attempting to manipulate one of the cards to place on the mirror.  She needed to turn it around so that the velcro would align itself, however she was unable to figure it out without max instructional cueing.   Finished with max assist stand pivot transfer to the wheelchair and return to the room.  She was left sitting up in the chair with the call button and phone in reach and safety belt in place.  Half lap tray positioned to support the RUE as well.    Therapy Documentation Precautions:  Precautions Precautions: Fall Precaution Comments: L hemipareisis, L inattention, mild pushing to L, L field of vision cut Restrictions Weight Bearing Restrictions: No   Pain: Pain Assessment Pain Scale: 0-10 Pain Score: 6  Pain Location: Head Pain Descriptors / Indicators: Headache ADL: See Care Tool Section for some details of mobility and selfcare   Therapy/Group: Individual Therapy  Kaytee Taliercio OTR/L 03/14/2021, 4:50 PM

## 2021-03-14 NOTE — Progress Notes (Signed)
PROGRESS NOTE   Subjective/Complaints:  Up in bed. No new complaints. Slept last night  ROS: Patient denies fever, rash, sore throat, blurred vision, nausea, vomiting, diarrhea, cough, shortness of breath or chest pain, joint or back pain, headache, or mood change.   Objective:   No results found. No results for input(s): WBC, HGB, HCT, PLT in the last 72 hours.  Recent Labs    03/13/21 0505  NA 135  K 4.3  CL 110  CO2 16*  GLUCOSE 109*  BUN 43*  CREATININE 0.85  CALCIUM 10.1     Intake/Output Summary (Last 24 hours) at 03/14/2021 1225 Last data filed at 03/14/2021 0830 Gross per 24 hour  Intake 853.42 ml  Output --  Net 853.42 ml        Physical Exam: Vital Signs Blood pressure 105/68, pulse 87, temperature 97.9 F (36.6 C), resp. rate 16, height 5\' 4"  (1.626 m), weight 53.7 kg, SpO2 95 %.  Constitutional: No distress . Vital signs reviewed. HEENT: EOMI, oral membranes moist Neck: supple Cardiovascular: RRR without murmur. No JVD    Respiratory/Chest: CTA Bilaterally without wheezes or rales. Normal effort    GI/Abdomen: BS +, non-tender, non-distended Ext: no clubbing, cyanosis, or edema Psych: pleasant and cooperative  Skin: No evidence of breakdown, no evidence of rash Neurologic: alert. Follows basic commands.  motor strength is 5/5 in right. 3-/5 left upper: deltoid, bicep, tricep, grip. LLE:  trace hip flexor, knee extensors, ankle dorsiflexor and plantar flexor Sensory exam normal sensation to light touch RIght and absent left  upper and lower extremities Musculoskeletal: Full range PROM in all 4 extremities.  Has pain with palpation and tightness in upper traps, neck, occiput     Assessment/Plan: 1. Functional deficits which require 3+ hours per day of interdisciplinary therapy in a comprehensive inpatient rehab setting. Physiatrist is providing close team supervision and 24 hour management of  active medical problems listed below. Physiatrist and rehab team continue to assess barriers to discharge/monitor patient progress toward functional and medical goals  Care Tool:  Bathing    Body parts bathed by patient: Left arm, Chest, Abdomen, Right upper leg, Left upper leg, Face   Body parts bathed by helper: Right lower leg, Left lower leg, Right arm, Front perineal area, Buttocks Body parts n/a: Right upper leg, Left upper leg, Buttocks, Front perineal area, Left lower leg, Right lower leg (did not attempt)   Bathing assist Assist Level: Maximal Assistance - Patient 24 - 49%     Upper Body Dressing/Undressing Upper body dressing   What is the patient wearing?: Pull over shirt    Upper body assist Assist Level: Maximal Assistance - Patient 25 - 49%    Lower Body Dressing/Undressing Lower body dressing      What is the patient wearing?: Pants, Incontinence brief     Lower body assist Assist for lower body dressing: Maximal Assistance - Patient 25 - 49%     Toileting Toileting    Toileting assist Assist for toileting: Maximal Assistance - Patient 25 - 49%     Transfers Chair/bed transfer  Transfers assist  Chair/bed transfer activity did not occur: Safety/medical concerns  Chair/bed  transfer assist level: Maximal Assistance - Patient 25 - 49% (stand pivot)     Locomotion Ambulation   Ambulation assist   Ambulation activity did not occur: Refused  Assist level: 2 helpers (+2 mod A) Assistive device: Other (comment) (3 Musketeer) Max distance: 38ft   Walk 10 feet activity   Assist  Walk 10 feet activity did not occur: Refused  Assist level: 2 helpers Assistive device: Walker-Eva   Walk 50 feet activity   Assist Walk 50 feet with 2 turns activity did not occur: Safety/medical concerns         Walk 150 feet activity   Assist Walk 150 feet activity did not occur: Safety/medical concerns         Walk 10 feet on uneven surface   activity   Assist Walk 10 feet on uneven surfaces activity did not occur: Safety/medical concerns         Wheelchair     Assist Will patient use wheelchair at discharge?: Yes Type of Wheelchair: Manual      Max wheelchair distance: 20 ft    Wheelchair 50 feet with 2 turns activity    Assist        Assist Level: Maximal Assistance - Patient 25 - 49%   Wheelchair 150 feet activity     Assist      Assist Level: Dependent - Patient 0%, Total Assistance - Patient < 25%   Blood pressure 105/68, pulse 87, temperature 97.9 F (36.6 C), resp. rate 16, height 5\' 4"  (1.626 m), weight 53.7 kg, SpO2 95 %.    Medical Problem List and Plan: 1. 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  Antithrombotics: -DVT/anticoagulation:  Mechanical: Sequential compression devices, below knee Bilateral lower extremities             -antiplatelet therapy: N/A 3. Headaches/Pain Management: Oxycodone prn for HA             --see below 4. Anxiety: LCSW to follow for evaluation and support. Recommended to continue to minimize use of Ativan due to negative effects of long term use on cognition.             -antipsychotic agents: N/A 5. Neuropsych: This patient is not capable of making decisions on her own behalf. 6. Skin/Wound Care: Routine pressure relief measures. D/c IV after magnesium supplementation as per patient request 7. Fluids/Electrolytes/Nutrition: Monitor I/O. Check CMET in am. 8. HTN: Monitor BP tid--currently on Lisinopril.Marland Kitchen --BP lower but no dizziness  -stop lisinopril and hold off on tizanidine as well  -encouraging fluids, HS IVF  -TEDS Vitals:   03/14/21 0300 03/14/21 0817  BP: 105/68   Pulse: 87   Resp: 16   Temp: 97.9 F (36.6  C)   SpO2: 100% 95%   BP improving on IVF 7/8 9. Seizure prophylaxis: As per discharge summary, neurology recommended discontinuing Keppra in case it was contributing to hallucinations, but states that hallucinations were present prior to admission and patient has been tremulous/twitchy raising some concern for seizure. 6/19 to 6/20 showed no evidence of seizures. Consider discontinuing 6/25 one week after  ICH if remains seizure free. d/c'ed 6/27 10. Intermittent fevers: Being monitored and have resolved without treatment. 11. Anemia of chronic illness?: Continue to monitor H/H with serial checks--around 9 since 12/2020. 12. Hypomagnesemia: Resume home dose of supplement. IV supplementation on 6/25 13. Hypokalemia: Will add daily supplement.just added KCL  14. 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  Freq BM reduced senna S to 1 po qhs 16.   HA : likely vascular and tension/muscular -continue topamax 75mg  bid, will schedule sports cream -off tizanidine d/t hypotension  -oxycodone as needed -added lidocaine patches for neck/upper shoulders  17.  Pre renal azotemia. Continue IVF at [facility], encourage fluids -some improvement in 7/7 labs -recheck BMET monday   LOS: 15 days A FACE TO [place] 03/14/2021, 12:25 PM

## 2021-03-15 MED ORDER — GABAPENTIN 100 MG PO CAPS
100.0000 mg | ORAL_CAPSULE | Freq: Three times a day (TID) | ORAL | Status: DC
  Administered 2021-03-15 – 2021-03-23 (×24): 100 mg via ORAL
  Filled 2021-03-15 (×24): qty 1

## 2021-03-15 NOTE — Progress Notes (Signed)
PROGRESS NOTE   Subjective/Complaints: Continues to have daily migraines. Discussed minimizing soft drinks and added sugar and she is agreeable. Also complains of left sided sciatic pain which was also present PTA  ROS: Patient denies fever, rash, sore throat, blurred vision, nausea, vomiting, diarrhea, cough, shortness of breath or chest pain, joint or back pain, headache, or mood change. +migraines  Objective:   No results found. No results for input(s): WBC, HGB, HCT, PLT in the last 72 hours.  Recent Labs    03/13/21 0505  NA 135  K 4.3  CL 110  CO2 16*  GLUCOSE 109*  BUN 43*  CREATININE 0.85  CALCIUM 10.1     Intake/Output Summary (Last 24 hours) at 03/15/2021 1153 Last data filed at 03/15/2021 0901 Gross per 24 hour  Intake 764.95 ml  Output --  Net 764.95 ml        Physical Exam: Vital Signs Blood pressure 96/66, pulse 71, temperature 97.9 F (36.6 C), resp. rate 17, height 5\' 4"  (1.626 m), weight 53.7 kg, SpO2 100 %. Gen: no distress, normal appearing HEENT: oral mucosa pink and moist, NCAT Cardio: Reg rate Chest: normal effort, normal rate of breathing Abd: soft, non-distended Ext: no edema Psych: pleasant, normal affect Skin: No evidence of breakdown, no evidence of rash Neurologic: alert. Follows basic commands.  motor strength is 5/5 in right. 3-/5 left upper: deltoid, bicep, tricep, grip. LLE:  trace hip flexor, knee extensors, ankle dorsiflexor and plantar flexor Sensory exam normal sensation to light touch RIght and absent left  upper and lower extremities Musculoskeletal: Full range PROM in all 4 extremities.  Has pain with palpation and tightness in upper traps, neck, occiput     Assessment/Plan: 1. Functional deficits which require 3+ hours per day of interdisciplinary therapy in a comprehensive inpatient rehab setting. Physiatrist is providing close team supervision and 24 hour management  of active medical problems listed below. Physiatrist and rehab team continue to assess barriers to discharge/monitor patient progress toward functional and medical goals  Care Tool:  Bathing    Body parts bathed by patient: Left arm, Chest, Abdomen, Right upper leg, Left upper leg, Face   Body parts bathed by helper: Right lower leg, Left lower leg, Right arm, Front perineal area, Buttocks Body parts n/a: Right upper leg, Left upper leg, Buttocks, Front perineal area, Left lower leg, Right lower leg (did not attempt)   Bathing assist Assist Level: Maximal Assistance - Patient 24 - 49%     Upper Body Dressing/Undressing Upper body dressing   What is the patient wearing?: Pull over shirt    Upper body assist Assist Level: Maximal Assistance - Patient 25 - 49%    Lower Body Dressing/Undressing Lower body dressing      What is the patient wearing?: Pants, Incontinence brief     Lower body assist Assist for lower body dressing: Maximal Assistance - Patient 25 - 49%     Toileting Toileting    Toileting assist Assist for toileting: Maximal Assistance - Patient 25 - 49%     Transfers Chair/bed transfer  Transfers assist  Chair/bed transfer activity did not occur: Safety/medical concerns  Chair/bed transfer assist  level: Minimal Assistance - Patient > 75%     Locomotion Ambulation   Ambulation assist   Ambulation activity did not occur: Refused  Assist level: Minimal Assistance - Patient > 75% Assistive device: Walker-rolling Max distance: 18 ft   Walk 10 feet activity   Assist  Walk 10 feet activity did not occur: Refused  Assist level: Minimal Assistance - Patient > 75% Assistive device: Walker-rolling   Walk 50 feet activity   Assist Walk 50 feet with 2 turns activity did not occur: Safety/medical concerns         Walk 150 feet activity   Assist Walk 150 feet activity did not occur: Safety/medical concerns         Walk 10 feet on uneven  surface  activity   Assist Walk 10 feet on uneven surfaces activity did not occur: Safety/medical concerns         Wheelchair     Assist Will patient use wheelchair at discharge?: Yes Type of Wheelchair: Manual      Max wheelchair distance: 20 ft    Wheelchair 50 feet with 2 turns activity    Assist        Assist Level: Maximal Assistance - Patient 25 - 49%   Wheelchair 150 feet activity     Assist      Assist Level: Dependent - Patient 0%, Total Assistance - Patient < 25%   Blood pressure 96/66, pulse 71, temperature 97.9 F (36.6 C), resp. rate 17, height 5\' 4"  (1.626 m), weight 53.7 kg, SpO2 100 %.    Medical Problem List and Plan: 1. 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  Antithrombotics: -DVT/anticoagulation:  Mechanical: Sequential compression devices, below knee Bilateral lower extremities             -antiplatelet therapy: N/A 3. Headaches/Pain Management: Oxycodone prn for HA. Recommended minimizing soft drinks and added sugar in diet and she is agreeable.              --see below 4. Anxiety: LCSW to follow for evaluation and support. Recommended to continue to minimize use of Ativan due to negative effects of long term use on cognition.             -antipsychotic agents: N/A 5. Neuropsych: This patient is not capable of making decisions on her own behalf. 6. Skin/Wound Care: Routine pressure relief measures. D/c IV after magnesium supplementation as per patient request 7. Fluids/Electrolytes/Nutrition: Monitor I/O. Check CMET in am. 8. HTN: Monitor BP tid--currently on Lisinopril.Marland Kitchen --BP lower but no dizziness  -stop lisinopril and hold off on tizanidine as well  -encouraging fluids, HS IVF  -TEDS Vitals:    03/15/21 0433 03/15/21 0816  BP: 96/66   Pulse: 71   Resp: 17   Temp: 97.9 F (36.6 C)   SpO2: 100% 100%   BP improving on IVF 7/8 9. Seizure prophylaxis: As per discharge summary, neurology recommended discontinuing Keppra in case it was contributing to hallucinations, but states that hallucinations were present prior to admission and patient has been tremulous/twitchy raising some concern for seizure. 6/19 to  6/20 showed no evidence of seizures. Consider discontinuing 6/25 one week after ICH if remains seizure free. d/c'ed 6/27 10. Intermittent fevers: Being monitored and have resolved without treatment. 11. Anemia of chronic illness?: Continue to monitor H/H with serial checks--around 9 since 12/2020. 12. Hypomagnesemia: Resume home dose of supplement. IV supplementation on 6/25 13. Hypokalemia: Will add daily supplement.just added KCL  14. 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  Freq BM reduced senna S to 1 po qhs 16.   HA : likely vascular and tension/muscular -continue topamax 75mg  bid, will schedule sports cream -off tizanidine d/t hypotension  -oxycodone as needed -added lidocaine patches for neck/upper shoulders 17.  Pre renal azotemia. Continue IVF at [facility], encourage fluids -some improvement in 7/7 labs -recheck BMET monday  18. Left sided sciatica: start gabapentin 100mg  TID 19. Insomnia: consider melatonin if sleep does not improve with gabapentin.   LOS: 16 days A FACE TO FACE EVALUATION WAS PERFORMED  Tonya [name] 03/15/2021, 11:53 AM

## 2021-03-15 NOTE — Progress Notes (Signed)
Physical Therapy Session Note  Patient Details  Name: Tonya [name] MRN: [id number] Date of Birth: 25-Jan-1946  Today's Date: 03/15/2021 PT Individual Time: 0807-0909 PT Individual Time Calculation (min): 62 min   Short Term Goals: Week 3:  PT Short Term Goal 1 (Week 3): Pt will perform supine<>sit with min assist PT Short Term Goal 2 (Week 3): Pt will perform sit<>stands using LRAD with min assist PT Short Term Goal 3 (Week 3): Pt will perform bed<>chair transfers using LRAD with min asist PT Short Term Goal 4 (Week 3): Pt will ambulate at least 71ft using LRAD with mod assist of 1  Skilled Therapeutic Interventions/Progress Updates:    Pt received supine in bed with breakfast in position and pt agreeable to therapy session. Pt requests to finish eating breakfast (her goal has been to increase intake) and is agreeable to transition to EOB to work on sitting balance simultaneously. Supine>sitting L EOB, HOB slightly elevated, with CGA for B LE management. Respiratory therapist in/out for breathing treatment. Sitting EOB no UE support with supervision, pt demoing improved midline orientation while sitting with no LOB. Focused on L attention and L UE NMR via functional incorporation into eating tasks with encouragement to place items on L side of plate and use L UE to hold and pick up cups, bowels, etc.   R squat pivot EOB>w/c with min assist for pivoting hips and cuing with manual facilitation for improved L LE positioning.  Transported to/from gym in w/c for time management and energy conservation. Cuing to scoot forward, bring L LE back under her BOS, and push up with both hands from w/c armrests prior to coming to stand. Sit>stand w/c>RW with min assist for lifting/balance and cuing for hip extension to achieve upright. Gait training 60ft!! using RW with only min progressed to light mod assist of 1 towards end due to fatigue, assist for balance due to L lean and AD management - pt demos  ability to advance L LE without assistance until requiring 3x assist towards end of walk due to fatigue (without DF assist ACE wrap) and is able to stabilize L LE knee control during stance phase without assist - does demo continuous L lean that increases with further distance and fatigue with pt starting to have R pelvic rotation - achieving reciprocal stepping pattern >85% of the time - able to turn and sit on mat with ability to step backwards with  LE during the turn without assist.  Sit>stand EOM>RW with repeated cuing for proper set-up and then only CGA for steadying while coming to stand. Gait training additional 37ft using RW with min assist for balance, though less L leaning noted this time, and for AD management - pt able to turn and sit in w/c with cuing for sequencing and assist for turning AD.   Transported back to room and pt agreeable to remain sitting in w/c - left with needs in reach and seat belt alarm on.     Therapy Documentation Precautions:  Precautions Precautions: Fall Precaution Comments: L hemipareisis, L inattention, mild pushing to L, L field of vision cut Restrictions Weight Bearing Restrictions: No   Pain: No complaints of pain throughout session.    Therapy/Group: Individual Therapy  Tawana Scale , PT, DPT, NCS, CSRS 03/15/2021, 7:47 AM

## 2021-03-16 ENCOUNTER — Inpatient Hospital Stay (HOSPITAL_COMMUNITY): Payer: Medicare HMO

## 2021-03-16 MED ORDER — LIDOCAINE 5 % EX PTCH
1.0000 | MEDICATED_PATCH | CUTANEOUS | Status: DC
  Administered 2021-03-16 – 2021-03-24 (×6): 1 via TRANSDERMAL
  Filled 2021-03-16 (×7): qty 1

## 2021-03-16 NOTE — Progress Notes (Signed)
Pt's bed alarm went off, staff responded.Pt was FOF. Pt is alert, responsive, facing down. Pt reports that she rolled over and fell of the bed. Pt denies hitting her head, just "bump my knees" V/S with normal limits. ROM in pt's limits, denies pain. MD. Tonya [name] was made aware, no new orders at this time.

## 2021-03-16 NOTE — Progress Notes (Signed)
PROGRESS NOTE   Subjective/Complaints: Had fall overnight without significant injury- does report pain over coccyx. XR obtained and wnl. Lidocaine patch ordered. Migraines continue, drinking coffee for first time today, used to drink every day at home  ROS: Patient denies fever, rash, sore throat, blurred vision, nausea, vomiting, diarrhea, cough, shortness of breath or chest pain, joint or back pain, headache, or mood change. +migraines, +coccyx pain  Objective:   DG Sacrum/Coccyx  Result Date: 03/16/2021 CLINICAL DATA:  Sacral pain.  History of lung cancer. EXAM: SACRUM AND COCCYX - 2+ VIEW COMPARISON:  None. FINDINGS: Osseous alignment appears normal. No fracture line or displaced fracture fragment is seen. No focal cortical irregularity or osseous lesion is seen, although the lower sacrum and coccyx are partially obscured by overlying bowel. Atherosclerosis of the infrarenal abdominal aorta. IMPRESSION: 1. No acute findings. 2. No lytic or blastic lesions are seen within the sacrum or coccyx, although portions of the sacrum and coccyx are obscured by overlying bowel. If symptoms persist or worsen, consider further characterization with CT or MRI. Electronically Signed   By: Franki Cabot M.D.   On: 03/16/2021 12:33   No results for input(s): WBC, HGB, HCT, PLT in the last 72 hours.  No results for input(s): NA, K, CL, CO2, GLUCOSE, BUN, CREATININE, CALCIUM in the last 72 hours.    Intake/Output Summary (Last 24 hours) at 03/16/2021 1834 Last data filed at 03/16/2021 1430 Gross per 24 hour  Intake 1307.92 ml  Output --  Net 1307.92 ml        Physical Exam: Vital Signs Blood pressure (!) 89/53, pulse 75, temperature 98.7 F (37.1 C), temperature source Oral, resp. rate 16, height 5\' 4"  (1.626 m), weight 53.7 kg, SpO2 100 %. Gen: no distress, normal appearing HEENT: oral mucosa pink and moist, NCAT Cardio: Reg rate Chest:  normal effort, normal rate of breathing Abd: soft, non-distended Ext: no edema Psych: pleasant, normal affect Neurologic: alert. Follows basic commands.  motor strength is 5/5 in right. 3-/5 left upper: deltoid, bicep, tricep, grip. LLE:  trace hip flexor, knee extensors, ankle dorsiflexor and plantar flexor Sensory exam normal sensation to light touch RIght and absent left  upper and lower extremities Musculoskeletal: Full range PROM in all 4 extremities.  Has pain with palpation and tightness in upper traps, neck, occiput     Assessment/Plan: 1. Functional deficits which require 3+ hours per day of interdisciplinary therapy in a comprehensive inpatient rehab setting. Physiatrist is providing close team supervision and 24 hour management of active medical problems listed below. Physiatrist and rehab team continue to assess barriers to discharge/monitor patient progress toward functional and medical goals  Care Tool:  Bathing    Body parts bathed by patient: Left arm, Chest, Abdomen, Right upper leg, Left upper leg, Face   Body parts bathed by helper: Right lower leg, Left lower leg, Right arm, Front perineal area, Buttocks Body parts n/a: Right upper leg, Left upper leg, Buttocks, Front perineal area, Left lower leg, Right lower leg (did not attempt)   Bathing assist Assist Level: Maximal Assistance - Patient 24 - 49%     Upper Body Dressing/Undressing Upper body dressing  What is the patient wearing?: Pull over shirt    Upper body assist Assist Level: Maximal Assistance - Patient 25 - 49%    Lower Body Dressing/Undressing Lower body dressing      What is the patient wearing?: Pants, Incontinence brief     Lower body assist Assist for lower body dressing: Maximal Assistance - Patient 25 - 49%     Toileting Toileting    Toileting assist Assist for toileting: Maximal Assistance - Patient 25 - 49%     Transfers Chair/bed transfer  Transfers assist  Chair/bed  transfer activity did not occur: Safety/medical concerns  Chair/bed transfer assist level: Minimal Assistance - Patient > 75% (squat pivot)     Locomotion Ambulation   Ambulation assist   Ambulation activity did not occur: Refused  Assist level: Moderate Assistance - Patient 50 - 74% Assistive device: Walker-rolling Max distance: 52ft   Walk 10 feet activity   Assist  Walk 10 feet activity did not occur: Refused  Assist level: Minimal Assistance - Patient > 75% Assistive device: Walker-rolling   Walk 50 feet activity   Assist Walk 50 feet with 2 turns activity did not occur: Safety/medical concerns  Assist level: Minimal Assistance - Patient > 75% Assistive device: Walker-rolling    Walk 150 feet activity   Assist Walk 150 feet activity did not occur: Safety/medical concerns         Walk 10 feet on uneven surface  activity   Assist Walk 10 feet on uneven surfaces activity did not occur: Safety/medical concerns         Wheelchair     Assist Will patient use wheelchair at discharge?: Yes Type of Wheelchair: Manual      Max wheelchair distance: 20 ft    Wheelchair 50 feet with 2 turns activity    Assist        Assist Level: Maximal Assistance - Patient 25 - 49%   Wheelchair 150 feet activity     Assist      Assist Level: Dependent - Patient 0%, Total Assistance - Patient < 25%   Blood pressure (!) 89/53, pulse 75, temperature 98.7 F (37.1 C), temperature source Oral, resp. rate 16, height 5\' 4"  (1.626 m), weight 53.7 kg, SpO2 100 %.    Medical Problem List and Plan: 1. 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  Antithrombotics: -DVT/anticoagulation:  Mechanical: Sequential compression devices,  below knee Bilateral lower extremities             -antiplatelet therapy: N/A 3. Headaches/Pain Management: Oxycodone prn for HA. Recommended minimizing soft drinks and added sugar in diet and she is agreeable. Provided advice regarding drinking coffee at regular time every morning and eliminating other sources of caffeine during the day.              --see below 4. Anxiety: LCSW to follow for evaluation and support. Recommended to continue to minimize use of Ativan due to negative effects of long term use on cognition.             -antipsychotic agents: N/A 5. Neuropsych: This  patient is not capable of making decisions on her own behalf. 6. Skin/Wound Care: Routine pressure relief measures. D/c IV after magnesium supplementation as per patient request 7. Fluids/Electrolytes/Nutrition: Monitor I/O. Check CMET in am. 8. HTN: Monitor BP tid--currently on Lisinopril.Marland Kitchen --BP lower but no dizziness  -stop lisinopril and hold off on tizanidine as well  -encouraging fluids, HS IVF  -TEDS Vitals:   03/16/21 1159 03/16/21 1648  BP: (!) 98/59 (!) 89/53  Pulse: 71 75  Resp: 16 16  Temp: 98.2 F (36.8 C) 98.7 F (37.1 C)  SpO2: 100% 100%   BP improving on IVF 7/8  7/10 soft off anti-hypertensives: consider discussion of decreasing benzo/opioids 9. Seizure prophylaxis: As per discharge summary, neurology recommended discontinuing Keppra in case it was contributing to hallucinations, but states that hallucinations were present prior to admission and patient has been tremulous/twitchy raising some concern for seizure. 6/19 to 6/20 showed no evidence of seizures. Consider discontinuing 6/25 one week after ICH if remains seizure free. d/c'ed 6/27 10. Intermittent fevers: Being monitored and have resolved without treatment. 11. Anemia of chronic illness?: Continue to monitor H/H with serial checks--around 9 since 12/2020. 12. Hypomagnesemia: Resume home dose of supplement. IV supplementation on 6/25 13.  Hypokalemia: Will add daily supplement.just added KCL  14. 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  Freq BM reduced senna S to 1 po qhs 16.   HA : likely vascular and tension/muscular -continue topamax 75mg  bid, will schedule sports cream -off tizanidine d/t hypotension  -oxycodone as needed -added lidocaine patches for neck/upper shoulders 17.  Pre renal azotemia. Continue IVF at [facility], encourage fluids -some improvement in 7/7 labs -recheck BMET monday  18. Left sided sciatica: start gabapentin 100mg  TID 19. Insomnia: consider melatonin if sleep does not improve with gabapentin.  20. Coccyx pain after fall: XR obtained and stable.   LOS: 17 days A FACE TO FACE EVALUATION WAS PERFORMED  Tonya [name] 03/16/2021, 6:34 PM

## 2021-03-17 LAB — CBC
HCT: 28 % — ABNORMAL LOW (ref 36.0–46.0)
Hemoglobin: 9.1 g/dL — ABNORMAL LOW (ref 12.0–15.0)
MCH: 32.9 pg (ref 26.0–34.0)
MCHC: 32.5 g/dL (ref 30.0–36.0)
MCV: 101.1 fL — ABNORMAL HIGH (ref 80.0–100.0)
Platelets: 201 10*3/uL (ref 150–400)
RBC: 2.77 MIL/uL — ABNORMAL LOW (ref 3.87–5.11)
RDW: 13.6 % (ref 11.5–15.5)
WBC: 8.2 10*3/uL (ref 4.0–10.5)
nRBC: 0 % (ref 0.0–0.2)

## 2021-03-17 LAB — BASIC METABOLIC PANEL
Anion gap: 7 (ref 5–15)
BUN: 19 mg/dL (ref 8–23)
CO2: 19 mmol/L — ABNORMAL LOW (ref 22–32)
Calcium: 9.5 mg/dL (ref 8.9–10.3)
Chloride: 109 mmol/L (ref 98–111)
Creatinine, Ser: 0.83 mg/dL (ref 0.44–1.00)
GFR, Estimated: >60 mL/min (ref >60)
Glucose, Bld: 148 mg/dL — ABNORMAL HIGH (ref 70–99)
Potassium: 3.6 mmol/L (ref 3.5–5.1)
Sodium: 135 mmol/L (ref 135–145)

## 2021-03-17 MED ORDER — TOPIRAMATE 25 MG PO TABS
75.0000 mg | ORAL_TABLET | Freq: Two times a day (BID) | ORAL | Status: DC
  Administered 2021-03-17 – 2021-03-25 (×16): 75 mg via ORAL
  Filled 2021-03-17 (×16): qty 3

## 2021-03-17 NOTE — Progress Notes (Signed)
PROGRESS NOTE   Subjective/Complaints:  Still with sacral tenderness, some intermittent HA as well no worsening   ROS: Patient denies fCP, SOB, N/V/D +migraines, +coccyx pain  Objective:   DG Sacrum/Coccyx  Result Date: 03/16/2021 CLINICAL DATA:  Sacral pain.  History of lung cancer. EXAM: SACRUM AND COCCYX - 2+ VIEW COMPARISON:  None. FINDINGS: Osseous alignment appears normal. No fracture line or displaced fracture fragment is seen. No focal cortical irregularity or osseous lesion is seen, although the lower sacrum and coccyx are partially obscured by overlying bowel. Atherosclerosis of the infrarenal abdominal aorta. IMPRESSION: 1. No acute findings. 2. No lytic or blastic lesions are seen within the sacrum or coccyx, although portions of the sacrum and coccyx are obscured by overlying bowel. If symptoms persist or worsen, consider further characterization with CT or MRI. Electronically Signed   By: Franki Cabot M.D.   On: 03/16/2021 12:33   Recent Labs    03/17/21 0834  WBC 8.2  HGB 9.1*  HCT 28.0*  PLT 201    Recent Labs    03/17/21 0834  NA 135  K 3.6  CL 109  CO2 19*  GLUCOSE 148*  BUN 19  CREATININE 0.83  CALCIUM 9.5      Intake/Output Summary (Last 24 hours) at 03/17/2021 1151 Last data filed at 03/17/2021 0851 Gross per 24 hour  Intake 1739.51 ml  Output --  Net 1739.51 ml         Physical Exam: Vital Signs Blood pressure 119/67, pulse 81, temperature 98.6 F (37 C), temperature source Oral, resp. rate 18, height 5\' 4"  (1.626 m), weight 53.7 kg, SpO2 100 %.  General: No acute distress Mood and affect are appropriate Heart: Regular rate and rhythm no rubs murmurs or extra sounds Lungs: Clear to auscultation, breathing unlabored, no rales or wheezes Abdomen: Positive bowel sounds, soft nontender to palpation, nondistended Extremities: No clubbing, cyanosis, or edema Skin: No evidence of  breakdown, no evidence of rash  Neurologic: alert. Follows basic commands.  motor strength is 5/5 in right. 3-/5 left upper: deltoid, bicep, tricep, grip. LLE:  trace hip flexor, knee extensors, ankle dorsiflexor and plantar flexor Sensory exam normal sensation to light touch RIght and absent left  upper and lower extremities Musculoskeletal: Full range PROM in all 4 extremities.  Has pain with palpation and tightness in upper traps, neck, occiput     Assessment/Plan: 1. Functional deficits which require 3+ hours per day of interdisciplinary therapy in a comprehensive inpatient rehab setting. Physiatrist is providing close team supervision and 24 hour management of active medical problems listed below. Physiatrist and rehab team continue to assess barriers to discharge/monitor patient progress toward functional and medical goals  Care Tool:  Bathing    Body parts bathed by patient: Left arm, Chest, Abdomen, Right upper leg, Left upper leg, Face   Body parts bathed by helper: Right lower leg, Left lower leg, Right arm, Front perineal area, Buttocks Body parts n/a: Right upper leg, Left upper leg, Buttocks, Front perineal area, Left lower leg, Right lower leg (did not attempt)   Bathing assist Assist Level: Maximal Assistance - Patient 24 - 49%  Upper Body Dressing/Undressing Upper body dressing   What is the patient wearing?: Pull over shirt    Upper body assist Assist Level: Maximal Assistance - Patient 25 - 49%    Lower Body Dressing/Undressing Lower body dressing      What is the patient wearing?: Pants, Incontinence brief     Lower body assist Assist for lower body dressing: Maximal Assistance - Patient 25 - 49%     Toileting Toileting    Toileting assist Assist for toileting: Maximal Assistance - Patient 25 - 49%     Transfers Chair/bed transfer  Transfers assist  Chair/bed transfer activity did not occur: Safety/medical concerns  Chair/bed transfer assist  level: Minimal Assistance - Patient > 75% (squat pivot)     Locomotion Ambulation   Ambulation assist   Ambulation activity did not occur: Refused  Assist level: Moderate Assistance - Patient 50 - 74% Assistive device: Walker-rolling Max distance: 58ft   Walk 10 feet activity   Assist  Walk 10 feet activity did not occur: Refused  Assist level: Minimal Assistance - Patient > 75% Assistive device: Walker-rolling   Walk 50 feet activity   Assist Walk 50 feet with 2 turns activity did not occur: Safety/medical concerns  Assist level: Minimal Assistance - Patient > 75% Assistive device: Walker-rolling    Walk 150 feet activity   Assist Walk 150 feet activity did not occur: Safety/medical concerns         Walk 10 feet on uneven surface  activity   Assist Walk 10 feet on uneven surfaces activity did not occur: Safety/medical concerns         Wheelchair     Assist Will patient use wheelchair at discharge?: Yes Type of Wheelchair: Manual      Max wheelchair distance: 20 ft    Wheelchair 50 feet with 2 turns activity    Assist        Assist Level: Maximal Assistance - Patient 25 - 49%   Wheelchair 150 feet activity     Assist      Assist Level: Dependent - Patient 0%, Total Assistance - Patient < 25%   Blood pressure 119/67, pulse 81, temperature 98.6 F (37 C), temperature source Oral, resp. rate 18, height 5\' 4"  (1.626 m), weight 53.7 kg, SpO2 100 %.    Medical Problem List and Plan: 1. 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  Antithrombotics: -DVT/anticoagulation:  Mechanical: Sequential compression devices, below knee Bilateral lower extremities             -antiplatelet therapy: N/A 3.  Headaches/Pain Management: Oxycodone prn for HA. Recommended minimizing soft drinks and added sugar in diet and she is agreeable. Provided advice regarding drinking coffee at regular time every morning and eliminating other sources of caffeine during the day.              --see below 4. Anxiety: LCSW to follow for evaluation and support. Recommended to continue to minimize use of Ativan due to negative effects of long term use on cognition.             -  antipsychotic agents: N/A 5. Neuropsych: This patient is not capable of making decisions on her own behalf. 6. Skin/Wound Care: Routine pressure relief measures. D/c IV after magnesium supplementation as per patient request 7. Fluids/Electrolytes/Nutrition: Monitor I/O. Check CMET in am. 8. HTN: Monitor BP tid--currently on Lisinopril.Marland Kitchen --BP lower but no dizziness  -stop lisinopril and hold off on tizanidine as well  -encouraging fluids, HS IVF  -TEDS Vitals:   03/17/21 0000 03/17/21 0405  BP: (!) 104/59 119/67  Pulse: 64 81  Resp: 18 18  Temp: 98.1 F (36.7 C) 98.6 F (37 C)  SpO2: 100% 100%   BP improving on IVF 7/11  Off BP meds 9. Seizure prophylaxis: As per discharge summary, neurology recommended discontinuing Keppra in case it was contributing to hallucinations, but states that hallucinations were present prior to admission and patient has been tremulous/twitchy raising some concern for seizure. 6/19 to 6/20 showed no evidence of seizures. Consider discontinuing 6/25 one week after ICH if remains seizure free. d/c'ed 6/27 10. Intermittent fevers: Being monitored and have resolved without treatment. 11. Anemia of chronic illness?: Continue to monitor H/H with serial checks--around 9 since 12/2020. 12. Hypomagnesemia: Resume home dose of supplement. IV supplementation on 6/25 13. Hypokalemia: Will add daily supplement.just added KCL  14. 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  Freq BM reduced senna S to 1 po qhs 16.   HA : likely vascular and tension/muscular -continue topamax 50mg  bid, will increase again to 75mg  BID -off tizanidine d/t hypotension  -oxycodone as needed -added lidocaine patches for neck/upper shoulders 17.  Pre renal azotemia. Continue IVF at [facility], encourage fluids -some improvement in 7/7 labs -recheck BMET 7/11 much improved  18. Left sided sciatica: start gabapentin 100mg  TID 19. Insomnia: consider melatonin if sleep does not improve with gabapentin.  20. Coccyx pain after fall: XR obtained and stable.   LOS: 18 days A FACE TO FACE EVALUATION WAS PERFORMED  Charlett Blake 03/17/2021, 11:51 AM

## 2021-03-17 NOTE — Progress Notes (Signed)
Physical Therapy Session Note  Patient Details  Name: Tonya [name] MRN: [id number] Date of Birth: [birth year]  Today's Date: 03/17/2021 PT Individual Time: 0900-1001 PT Individual Time Calculation (min): 61 min   Short Term Goals: Week 3:  PT Short Term Goal 1 (Week 3): Pt will perform supine<>sit with min assist PT Short Term Goal 2 (Week 3): Pt will perform sit<>stands using LRAD with min assist PT Short Term Goal 3 (Week 3): Pt will perform bed<>chair transfers using LRAD with min asist PT Short Term Goal 4 (Week 3): Pt will ambulate at least 65ft using LRAD with mod assist of 1   Skilled Therapeutic Interventions/Progress Updates:  Patient in bathroom with NT on entrance to room. Patient alert and agreeable to PT session. Husband present and prepared for education. Patient with no pain complaint throughout session.  Neuromuscular Re-ed: NMR facilitated during session with focus on standing balance. Pt guided in completing functional task at sink. Initially stands with no UE support to clean dentures but over short time requires assist to maintain with hip/ pelvic support at sink edge to maintain standing and continue brushing task with BUE. Intermittent steadying with UE and vc for L knee extension. Requests to sit to complete. Stands again to wash hands and requires lean into sink to maintain balance d/t fatigue. NMR performed for improvements in motor control and coordination, balance, sequencing, judgement, and self confidence/ efficacy in performing all aspects of mobility at highest level of independence.   Therapeutic Activity: Transfers: Patient performed STS to RW with light CGA and SPVT transfers w/c <> mat table using RW with CGA. Requiring vc for sequencing RW mgmt with pivot stepping. Educated husband throughout on hand placement for stability and pt guidance as well as pt and husband safety. Husband is able to return demonstrate STS and SPVT transfer assist with CGA  and good technique. Provides good vc with positive encouragement for pt.  Husband also assisted with close supervision transfer with no AD of SPVT w/c to bed using bedrail for support. Guided in hands being close to assist if needed, but allowing pt to complete transfer with vc as reminders to complete sequence of steps with control and safety.    Gait Training:  Patient ambulated 53' x2/ 20' x1 feet using RW with CGA provided by husband and PT. Husband demonstrated good technique with r arm around pt's back with hand on R pelvis and L hand on L pelvis Gait belt in hand for safety. He provided good vc for L foot clearance, L foot advancement, and walker mgmt. Pt provided vc/ tc to husband and pt for technique, pt's L foot clearance, L knee stability/ extension in stance phase, and upright posture. Pt demo'd decreased foot clearance with LLE requiring more vc than previous session for increased effort to increase L step height.  Visual demonstration with verbal instruction provided to pt and husband re: use of walker to negotiate 4" curb step as pt will have one step onto porch with no HR to enter home. Husband provides good CGA on approach to step but pt unable to take step up onto platform with CGA from husband and L knee block from PT with max encouragement. Pt states fatigue and requests to sit. Pt encouraged to rest this day and another attempt at stairs to happen at next session.  Bed Mobility: Patient performed sit-->supine with supervision and several attempts for pt to bring LLE to bed surface with vc for increasing effort. Husband also providing  vc for increasing effort and encouragement.   Patient supine  in bed at end of session with brakes locked, bed alarm set, and all needs within reach as she requested to rest prior to next therapy session.     Therapy Documentation Precautions:  Precautions Precautions: Fall Precaution Comments: L hemipareisis, L inattention, mild pushing to L, L  field of vision cut Restrictions Weight Bearing Restrictions: No   Therapy/Group: Individual Therapy  Alger Simons PT, DPT  03/17/2021, 1:01 PM

## 2021-03-17 NOTE — Progress Notes (Signed)
Occupational Therapy Session Note  Patient Details  Name: Tonya [name] MRN: [id number] Date of Birth: September 24, 1945  Today's Date: 03/17/2021 OT Individual Time: 2563-8937   &   3428-7681 OT Individual Time Calculation (min): 74 min    &   27 min   Short Term Goals: Week 3:  OT Short Term Goal 1 (Week 3): Pt will complete UB dressing with mod assist following hemi techniques. OT Short Term Goal 2 (Week 3): Pt will complete LB bathing with mod assist sit to stand. OT Short Term Goal 3 (Week 3): Pt will donn pull up pants sit to stand with mod assist.  Skilled Therapeutic Interventions/Progress Updates:    AM session:   Patient in bed, husband present for therapy session.  Patient denies pain but states that she is fatigued.  Supine to sitting edge of bed with CGA.  Reviewed supervision, cues and positioning options to ensure safety for adl tasks.  Patient demonstrated upper body and lower body dressing seated edge of bed.  Note significant improvement in using left hand for functional activities.  She continues to require occ cues for sequencing, awareness of left side and spatial cues.  OH shirt min A.  Pants min A, clothing management in stance with min/mod A.  Socks and shoes with mod A.  She is able to cross legs in unsupported sitting with CS and occ tactile cues for midline.  Sit to stand at edge of bed with CGA - occ lateral lean to left and posterior with cues she is able to correct.  SPT with RW mod A.  Sit pivot transfer min A.  Reviewed transfer options with husband.  He completed hands on tub transfer with min A for physical guidance and cues for set up of w/c and technique.  Patient currently in 18" chair (overall width 25")  husband notes that door to bathroom is 23".  Patient remained seated in w/c at close of session, seat belt alarm set and set up for lunch.  Call bell in reach.     PM session:   Patient seated in w/c, alert and ready for afternoon session.  SPT w/c to/from  mat table with min A.  Cues for left side awareness and weight shift.  Completed various standing balance, weight shift, stepping, reaching and trunk control activities with CG/min A (cues for left knee control and midline).   Sit to stand CGA.  Completed seated left UE coordination and strengthening activities with improved control and AROM t/o.  She remained seated in w/c at close of session, seat belt alarm set and callbell in reach.       Therapy Documentation Precautions:  Precautions Precautions: Fall Precaution Comments: L hemipareisis, L inattention, mild pushing to L, L field of vision cut Restrictions Weight Bearing Restrictions: No   Therapy/Group: Individual Therapy  Carlos Levering 03/17/2021, 7:41 AM

## 2021-03-17 NOTE — Progress Notes (Signed)
**  Late Entry**    03/16/21 0114  What Happened  Was fall witnessed? No  Was patient injured? No  Patient found on floor  Found by Staff-comment  Stated prior activity other (comment) (sleeping)  Follow Up  MD notified Dr. Ranell Patrick  Time MD notified [phone]  Additional tests No  Simple treatment Other (comment) (observe / monitor for any changes)  Adult Fall Risk Assessment  Risk Factor Category (scoring not indicated) Fall has occurred during this admission (document High fall risk)  Age 75  Fall History: Fall within 6 months prior to admission 0  Elimination; Bowel and/or Urine Incontinence 2  Elimination; Bowel and/or Urine Urgency/Frequency 0  Medications: includes PCA/Opiates, Anti-convulsants, Anti-hypertensives, Diuretics, Hypnotics, Laxatives, Sedatives, and Psychotropics 3  Patient Care Equipment 1  Mobility-Assistance 2  Mobility-Gait 2  Mobility-Sensory Deficit 0  Altered awareness of immediate physical environment 1  Impulsiveness 0  Lack of understanding of one's physical/cognitive limitations 0  Total Score 13  Patient Fall Risk Level High fall risk  Adult Fall Risk Interventions  Required Bundle Interventions *See Row Information* High fall risk - low, moderate, and high requirements implemented  Additional Interventions Use of appropriate toileting equipment (bedpan, BSC, etc.)  Screening for Fall Injury Risk (To be completed on HIGH fall risk patients) - Assessing Need for Floor Mats  Risk For Fall Injury- Criteria for Floor Mats Confusion/dementia (+NuDESC, CIWA, TBI, etc.)  Pain Assessment  Pain Scale 0-10  Pain Score 0  PCA/Epidural/Spinal Assessment  Respiratory Pattern Regular;Unlabored  Neurological  Level of Consciousness Alert

## 2021-03-17 NOTE — Progress Notes (Signed)
Speech Language Pathology Daily Session Note  Patient Details  Name: Tonya [name] MRN: [id number] Date of Birth: 10/06/1945  Today's Date: 03/17/2021 SLP Individual Time: 1450-1550 SLP Individual Time Calculation (min): 60 min  Short Term Goals: Week 3: SLP Short Term Goal 1 (Week 3): Patient will recall and describe specific therapeutic interventions with PT/OT/SLP with min-to-mod A cues. SLP Short Term Goal 2 (Week 3): Patient will demonstrate adequate reasoning and problem solving when completed simulated functional tasks with modA cues. SLP Short Term Goal 3 (Week 3): Patient will demonstrate consistent emergent awareness to deficits and limitations, with min-to-modA -cues. SLP Short Term Goal 4 (Week 3): Patient will perform basic-mild complex organizing and/or sequencing tasks with min-to-mod A cues.  Skilled Therapeutic Interventions: Patient agreeable to skilled ST intervention with focus on cognitive goals and family education with patient's spouse. SLP facilitated 4-step ADL sequencing task with mod A verbal and visual cues for accurate sequential steps. Patient was able to sustain attention for approximately 30 minutes prior to requiring increasingly more support including verbal repetition, re-directing focus, mental flexibility, and verbal reasoning. Educated spouse on current cognitive functioning and strategies to maximize attention, memory, and problem solving abilities. Spouse indicated understanding through teach back at the end of session. Patient was left in her wheelchair with alarm activated and needs within reach at end of session. Continue per ST plan of care.     Pain Pain Assessment Pain Scale: 0-10 Pain Score: 0-No pain  Therapy/Group: Individual Therapy  Tonya [name] 03/17/2021, 3:58 PM

## 2021-03-18 NOTE — Progress Notes (Signed)
PROGRESS NOTE   Subjective/Complaints:  Headache slightly better today.  She also has pain in her right upper trap that radiates to her temporal area.  No visual changes.  ROS: Patient denies CP, SOB, N/V/D +migraines, +coccyx pain  Objective:   DG Sacrum/Coccyx  Result Date: 03/16/2021 CLINICAL DATA:  Sacral pain.  History of lung cancer. EXAM: SACRUM AND COCCYX - 2+ VIEW COMPARISON:  None. FINDINGS: Osseous alignment appears normal. No fracture line or displaced fracture fragment is seen. No focal cortical irregularity or osseous lesion is seen, although the lower sacrum and coccyx are partially obscured by overlying bowel. Atherosclerosis of the infrarenal abdominal aorta. IMPRESSION: 1. No acute findings. 2. No lytic or blastic lesions are seen within the sacrum or coccyx, although portions of the sacrum and coccyx are obscured by overlying bowel. If symptoms persist or worsen, consider further characterization with CT or MRI. Electronically Signed   By: Franki Cabot M.D.   On: 03/16/2021 12:33   Recent Labs    03/17/21 0834  WBC 8.2  HGB 9.1*  HCT 28.0*  PLT 201     Recent Labs    03/17/21 0834  NA 135  K 3.6  CL 109  CO2 19*  GLUCOSE 148*  BUN 19  CREATININE 0.83  CALCIUM 9.5       Intake/Output Summary (Last 24 hours) at 03/18/2021 0904 Last data filed at 03/18/2021 0826 Gross per 24 hour  Intake 840 ml  Output --  Net 840 ml         Physical Exam: Vital Signs Blood pressure (!) 97/58, pulse 68, temperature (!) 97.5 F (36.4 C), temperature source Oral, resp. rate 16, height 5\' 4"  (1.626 m), weight 56.4 kg, SpO2 100 %.  General: No acute distress Mood and affect are appropriate Heart: Regular rate and rhythm no rubs murmurs or extra sounds Lungs: Clear to auscultation, breathing unlabored, no rales or wheezes Abdomen: Positive bowel sounds, soft nontender to palpation, nondistended Extremities:  No clubbing, cyanosis, or edema Skin: No evidence of breakdown, no evidence of rash   Neurologic: alert. Follows basic commands.  motor strength is 5/5 in right. 3-/5 left upper: deltoid, bicep, tricep, grip. LLE:  trace hip flexor, knee extensors, ankle dorsiflexor and plantar flexor Sensory exam normal sensation to light touch RIght and absent left  upper and lower extremities Musculoskeletal: Full range PROM in all 4 extremities.  Has pain with palpation and tightness in upper traps, neck, occiput     Assessment/Plan: 1. Functional deficits which require 3+ hours per day of interdisciplinary therapy in a comprehensive inpatient rehab setting. Physiatrist is providing close team supervision and 24 hour management of active medical problems listed below. Physiatrist and rehab team continue to assess barriers to discharge/monitor patient progress toward functional and medical goals  Care Tool:  Bathing    Body parts bathed by patient: Left arm, Chest, Abdomen, Right upper leg, Left upper leg, Face   Body parts bathed by helper: Right lower leg, Left lower leg, Right arm, Front perineal area, Buttocks Body parts n/a: Right upper leg, Left upper leg, Buttocks, Front perineal area, Left lower leg, Right lower leg (did not attempt)  Bathing assist Assist Level: Maximal Assistance - Patient 24 - 49%     Upper Body Dressing/Undressing Upper body dressing   What is the patient wearing?: Pull over shirt    Upper body assist Assist Level: Minimal Assistance - Patient > 75%    Lower Body Dressing/Undressing Lower body dressing      What is the patient wearing?: Pants     Lower body assist Assist for lower body dressing: Minimal Assistance - Patient > 75%     Toileting Toileting    Toileting assist Assist for toileting: Maximal Assistance - Patient 25 - 49%     Transfers Chair/bed transfer  Transfers assist  Chair/bed transfer activity did not occur: Safety/medical  concerns  Chair/bed transfer assist level: Minimal Assistance - Patient > 75% (squat pivot)     Locomotion Ambulation   Ambulation assist   Ambulation activity did not occur: Refused  Assist level: Moderate Assistance - Patient 50 - 74% Assistive device: Walker-rolling Max distance: 28ft   Walk 10 feet activity   Assist  Walk 10 feet activity did not occur: Refused  Assist level: Minimal Assistance - Patient > 75% Assistive device: Walker-rolling   Walk 50 feet activity   Assist Walk 50 feet with 2 turns activity did not occur: Safety/medical concerns  Assist level: Minimal Assistance - Patient > 75% Assistive device: Walker-rolling    Walk 150 feet activity   Assist Walk 150 feet activity did not occur: Safety/medical concerns         Walk 10 feet on uneven surface  activity   Assist Walk 10 feet on uneven surfaces activity did not occur: Safety/medical concerns         Wheelchair     Assist Will patient use wheelchair at discharge?: Yes Type of Wheelchair: Manual      Max wheelchair distance: 20 ft    Wheelchair 50 feet with 2 turns activity    Assist        Assist Level: Maximal Assistance - Patient 25 - 49%   Wheelchair 150 feet activity     Assist      Assist Level: Dependent - Patient 0%, Total Assistance - Patient < 25%   Blood pressure (!) 97/58, pulse 68, temperature (!) 97.5 F (36.4 C), temperature source Oral, resp. rate 16, height 5\' 4"  (1.626 m), weight 56.4 kg, SpO2 100 %.    Medical Problem List and Plan: 1. 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  Antithrombotics: -DVT/anticoagulation:  Mechanical: Sequential compression devices, below knee Bilateral lower extremities              -antiplatelet therapy: N/A 3. Headaches/Pain Management: Oxycodone prn for HA. Recommended minimizing soft drinks and added sugar in diet and she is agreeable. Provided advice regarding drinking coffee at regular time every morning and eliminating other sources of caffeine during the day.              --see below 4. Anxiety: LCSW to follow for evaluation and support. Recommended to continue to minimize use of Ativan due to negative effects of long term use  on cognition.             -antipsychotic agents: N/A 5. Neuropsych: This patient is not capable of making decisions on her own behalf. 6. Skin/Wound Care: Routine pressure relief measures. D/c IV after magnesium supplementation as per patient request 7. Fluids/Electrolytes/Nutrition: Monitor I/O. Check CMET in am. 8. HTN: Monitor BP tid--currently on Lisinopril.Marland Kitchen --BP lower but no dizziness  -stop lisinopril and hold off on tizanidine as well  -encouraging fluids, HS IVF  -TEDS Vitals:   03/18/21 0053 03/18/21 0421  BP: (!) 93/58 (!) 97/58  Pulse: 70 68  Resp: 18 16  Temp: 98.1 F (36.7 C) (!) 97.5 F (36.4 C)  SpO2: 100% 100%   BP improving on IVF 7/12  Off BP meds 9. Seizure prophylaxis: As per discharge summary, neurology recommended discontinuing Keppra in case it was contributing to hallucinations, but states that hallucinations were present prior to admission and patient has been tremulous/twitchy raising some concern for seizure. 6/19 to 6/20 showed no evidence of seizures. Consider discontinuing 6/25 one week after ICH if remains seizure free. d/c'ed 6/27 10. Intermittent fevers: Being monitored and have resolved without treatment. 11. Anemia of chronic illness?: Continue to monitor H/H with serial checks--around 9 since 12/2020.expect some drop with IVF fluids due to hemodilution  12. Hypomagnesemia: Resume home dose of supplement. IV supplementation on 6/25 13. Hypokalemia: Will add daily supplement.just added KCL  14.  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  Freq BM reduced senna S to 1 po qhs 16.   HA : likely vascular and tension/muscular -continue topamax 50mg  bid, will increase again to 75mg  BID -off tizanidine d/t hypotension  -oxycodone as needed -added lidocaine patches for neck/upper shoulders 17.  Pre renal azotemia. Continue IVF at [facility], encourage fluids -some improvement in 7/7 labs -recheck BMET 7/11 much improved  18. Left sided sciatica: start gabapentin 100mg  TID 19. Insomnia: consider melatonin if sleep does not improve with gabapentin.  20. Coccyx pain after fall: XR obtained and stable.   LOS: 19 days A FACE TO FACE EVALUATION WAS PERFORMED  Charlett Blake 03/18/2021, 9:04 AM

## 2021-03-18 NOTE — Progress Notes (Signed)
Physical Therapy Session Note  Patient Details  Name: Tonya [name] MRN: [id number] Date of Birth: [birth year]  Today's Date: 03/18/2021 PT Individual Time: 1305-1400 PT Individual Time Calculation (min): 55 min   Short Term Goals: Week 3:  PT Short Term Goal 1 (Week 3): Pt will perform supine<>sit with min assist PT Short Term Goal 2 (Week 3): Pt will perform sit<>stands using LRAD with min assist PT Short Term Goal 3 (Week 3): Pt will perform bed<>chair transfers using LRAD with min asist PT Short Term Goal 4 (Week 3): Pt will ambulate at least 15ft using LRAD with mod assist of 1  Skilled Therapeutic Interventions/Progress Updates:    Pt received sitting in w/c and agreeable to therapy session. Pt's 2 daughters arriving immediately after for hands-on education/training. Corene Cornea, ATP present from [place] for custom wheelchair consultation. Therapist discussed wheelchair consult had been scheduled prior to pt's recent advancement in functional mobility level; however, discussed that pt would still benefit from rental wheelchair to allow increased independence in the home as pt will require hands-on assistance to stand and ambulate with family using RW. Corene Cornea, ATP planning to bring rental wheelchair tomorrow to be used with additional hands-on family education/training - pt and family in agreement with this plan. Donned shoes max assist. Therapist educated family on recommendation for pt to wear shoes inside the home during mobility and educated on proper use of gait belt during mobility tasks. Transported to gym in w/c for time management and energy conservation. Therapist educated on need to guard pt on the L side and continuously monitor pt's L hemibody positioning and cue pt for L attention.   Sit>stand w/c>RW with CGA/min assist for steadying/balance due to slight posterior lean from delayed hip extension and anterior weight shift. Gait training ~56ft using RW with therapist providing  CGA for steadying primarily and then min assist for balance when turning and when going to sit in w/c - cuing for increased L LE foot clearance and step length as well as sequencing of AD and stepping when going to sit in the chair. Therapist educated pt's family on monitoring her L foot clearance, step length, and AD management along with importance of providing pt cuing for correction and awareness of these while ambulating.  Educated pt/family on proper technique/sequencing for stand pivot transfer w/c<>car simulator. Pt performed L stand pivot w/c>car simulator (sedan height) with pt's daughter providing proper hands-on assist - therapist providing cuing as needed for sequencing. Pt performed ambulatory transfer to exit car using RW and pt's other daughter providing assistance to gain hands-on experience with ambulation - ambulated ~85ft - therapist cuing as needed for safety. Pt's daughters both demonstrate good attention to pt's balance and safety and demo good cuing to patient on correction and awareness. 1x pt was turning R and brought L foot too close to midline causing L lean with pt's daughter responding appropriately and increasing hands-on assist and cuing pt to correct her foot placement. Pt's family planning to return tomorrow for additional hands on training/education. Pt left sitting in w/c as hand-off to OT.  Therapy Documentation Precautions:  Precautions Precautions: Fall Precaution Comments: L hemipareisis, L inattention, mild pushing to L, L field of vision cut Restrictions Weight Bearing Restrictions: No   Pain: No reports of pain throughout session.    Therapy/Group: Individual Therapy  Tawana Scale , PT, DPT, NCS, CSRS 03/18/2021, 12:17 PM

## 2021-03-18 NOTE — Progress Notes (Signed)
Nutrition Follow-up  DOCUMENTATION CODES:   Not applicable  INTERVENTION:  Continue Ensure Enlive po QID, each supplement provides 350 kcal and 20 grams of protein.  Continue 30 ml Prosource Plus po QID, each supplement provides 100 kcal and 15 grams of protein.   Continue Magic cup TID with meals, each supplement provides 290 kcal and 9 grams of protein  Nourishment snacks ordered.   NUTRITION DIAGNOSIS:   Increased nutrient needs related to  (therapy) as evidenced by estimated needs; ongoing  GOAL:   Patient will meet greater than or equal to 90% of their needs; progressing  MONITOR:   PO intake, Supplement acceptance, Skin, Weight trends, Labs, I & O's  REASON FOR ASSESSMENT:   Malnutrition Screening Tool    ASSESSMENT:   75 year old female with history of Lung cancer s/p RUL lobectomy with ongoing chemo and bradycardia presenting with reports of HA followed by confusion. CT head showed large intraparenchymal hemorrhage within right parietal lobe as well as mild right frontal subarachnoid hemorrhage and patient had tremulousness with concerns of seizure activity. MRI/MRA brain done showing stable appearance of right parietal hemorrhage without underlying mass or vascular malformation, subdural and subarachnoid hemorrhage and small amount of blood within the ventricles without evidence of stenosis, aneurysm or occlusion. EEG done showing cortical dysfunction right hemisphere secondary to underlying stroke and severe diffuse encephalopathy. Bleed was likely hypertensive in nature but to repeat MRI brain in 2 to 3 months due to unclear etiology of bleed. CIR was recommended due to functional decline.  Meal completion has been varied from 10-95%. Pt reports dislike of most food on meal tray. Discussed food preferences and food options. Encouraged adequate PO intake to ensure nutrition is adequate for pt's needs. Pt reports family does bring in food from outside/home. Pt currently  has Ensure and Prosource plus ordered and has been consuming them. RD to continue with current orders to aid in nutrition needs.   Labs and medications reviewed.   Diet Order:   Diet Order             Diet regular Room service appropriate? Yes; Fluid consistency: Thin  Diet effective now                   EDUCATION NEEDS:   Not appropriate for education at this time  Skin:  Skin Assessment: Reviewed RN Assessment  Last BM:  7/11  Height:   Ht Readings from Last 1 Encounters:  02/27/21 5\' 4"  (1.626 m)    Weight:   Wt Readings from Last 1 Encounters:  03/18/21 56.4 kg    BMI:  Body mass index is 21.34 kg/m.  Estimated Nutritional Needs:   Kcal:  1850-2050  Protein:  85-100 grams  Fluid:  >/= 1.8 L/day  Corrin Parker, MS, RD, LDN RD pager number/after hours weekend pager number on Am[employer].

## 2021-03-18 NOTE — Progress Notes (Signed)
Patient ID: Tonya [name], female   DOB: Jun 22, 1946, 75 y.o.   MRN: [id number]  Tub transfer Bench and Drop Arm ordered through [place], [place]

## 2021-03-18 NOTE — Progress Notes (Signed)
Occupational Therapy Session Note  Patient Details  Name: Tonya [name] MRN: [id number] Date of Birth: 1945/12/01  Today's Date: 03/18/2021 OT Individual Time: 1100-1202   &   1400-1500 OT Individual Time Calculation (min): 62 min    &   60 min   Short Term Goals: Week 3:  OT Short Term Goal 1 (Week 3): Pt will complete UB dressing with mod assist following hemi techniques. OT Short Term Goal 2 (Week 3): Pt will complete LB bathing with mod assist sit to stand. OT Short Term Goal 3 (Week 3): Pt will donn pull up pants sit to stand with mod assist.  Skilled Therapeutic Interventions/Progress Updates:    AM session:   Patient in bed, alert but states that she is tired.  Supine to sitting edge of bed with CS and increased time.  SPT to/from bed, w/c and toilet with min A.  Continent of urine on toilet.  She is able to pull pants down and complete hygiene with CGA, min A for pants up in stance.  She declined shower this am due to fatigue.  Doffs clean pants with mod A.  UB bathing w/c level with set up and increased time.  OH shirt minA cues due to putting it on backwards first attempt - note improved use of left hand for all adl tasks.  Oral care with set up.  Ambulation with RW 12 feet x 2 min a and min cues.  She returned to w/c at close of session, seat belt alarm set and call bell in hand.     PM session:   patient seated in w/c finishing family education with PT - daughters present for session and eager to learn about Mom's progress and how best to help her upon return home.  Verbal review of adl assistance needs and optimal positioning for ongoing strengthening and independence.  Discussed visual perceptual difficulties, safety and techniques to promote ongoing improvement with adl, mobility and reading/iadl.  Reviewed DME and completed hands on guarding with short distance ambulation and transfer to/from shower bench - both daughters demonstrate competence with safe guarding for  mobility/transfer.  Reviewed and completed both seated hand/dexterity activities and standing dynamic reach and weight shift/balance activities.  Patient returned to sitting in w/c at close of session due to fatigue.  Daughters pleased with progress to date.  Seat belt alarm set and call bell in reach - awaiting speech therapy session.      Therapy Documentation Precautions:  Precautions Precautions: Fall Precaution Comments: L hemipareisis, L inattention, mild pushing to L, L field of vision cut Restrictions Weight Bearing Restrictions: No   Therapy/Group: Individual Therapy  Carlos Levering 03/18/2021, 7:34 AM

## 2021-03-18 NOTE — Progress Notes (Signed)
Speech Language Pathology Daily Session Note  Patient Details  Name: Tonya [name] MRN: [id number] Date of Birth: 1945-12-02  Today's Date: 03/18/2021 SLP Individual Time: 9937-1696 SLP Individual Time Calculation (min): 40 min  Short Term Goals: Week 3: SLP Short Term Goal 1 (Week 3): Patient will recall and describe specific therapeutic interventions with PT/OT/SLP with min-to-mod A cues. SLP Short Term Goal 2 (Week 3): Patient will demonstrate adequate reasoning and problem solving when completed simulated functional tasks with modA cues. SLP Short Term Goal 3 (Week 3): Patient will demonstrate consistent emergent awareness to deficits and limitations, with min-to-modA -cues. SLP Short Term Goal 4 (Week 3): Patient will perform basic-mild complex organizing and/or sequencing tasks with min-to-mod A cues.  Skilled Therapeutic Interventions: Patient agreeable for skilled ST intervention with focus on cognitive goals and family education. Patient was accompanied by her two daughters on this date. Provided education on patient's progress, cognitive areas of strength, areas for continued improvement, and cognitive-communication strategies to enhance patient's current function and safety within the home environment at discharge. SLP facilitated working memory and Facilities manager task involving 4 word retention and answering questions pertaining to word list - inclusion (e.g., word list: apple, broccoli, cucumber, banana. Question: which ones are fruits?) Exclusion (e.g., word list: car, drill, helicopter, hammer. Question: which ones are not tools?). Sequential order (e.g., word list: bathroom, living room, garage, kitchen. Question: what was the 3rd word?). Patient completed with 60% accuracy and mod A verbal cues for repetition of word list. Patient benefited from verbally repeating word list prior to being asked questions She required cue to initiate between each occasion. She exhibited  fatigue toward end of session resulting in the need for max cues. Patient was eventually transferred to bed with the support of her daughters while supervised by ST for safety. Patient exhibited reduced safety awareness/awareness of her deficits as she told her daughters she has been transferring on her own and does not require any help which is not accurate. SLP provided hands on assist and patient was safely transferred to bed with Min A with alarm activated and needs within reach. Family was educated to always be available to provide assistance even if patient states she doesn't need it secondary to her cognitive deficits involving inconsistent safety awareness and limited insight into her deficits. Family verbalized understanding through teach back. All questions were addressed and education complete. Continue per current plan of care.      Pain Pain Assessment Pain Scale: 0-10 Pain Score: 3  Pain Location: Head Pain Descriptors / Indicators: Headache Pain Intervention(s): Repositioned;Other (Comment) (facilitated low stimulation environment)  Therapy/Group: Individual Therapy  [name] 03/18/2021, 4:40 PM

## 2021-03-19 NOTE — Progress Notes (Signed)
Physical Therapy Session Note  Patient Details  Name: Tonya [name] MRN: [id number] Date of Birth: Feb 21, 1946  Today's Date: 03/19/2021 PT Individual Time: 0160-1093 and 2355-7322 PT Individual Time Calculation (min): 30 min  and 49 min and  Today's Date: 03/19/2021 PT Missed Time: 30 Minutes Missed Time Reason: Other (Comment) (Meal)  Short Term Goals: Week 3:  PT Short Term Goal 1 (Week 3): Pt will perform supine<>sit with min assist PT Short Term Goal 2 (Week 3): Pt will perform sit<>stands using LRAD with min assist PT Short Term Goal 3 (Week 3): Pt will perform bed<>chair transfers using LRAD with min asist PT Short Term Goal 4 (Week 3): Pt will ambulate at least 63ft using LRAD with mod assist of 1  Skilled Therapeutic Interventions/Progress Updates:    Session 1: Pt received supine in bed talking on the phone with her husband regarding DME recommendations and D/C plan. Pt agreeable to therapy session and therapist answering pt's husband's questions regarding recommendation for a rental w/c at this time to allow pt continued progress with long term goals of being at an ambulatory functional mobility level. Discussed plans for additional family education/training today with pt's husband and Mingo Amber, pt's sister, planning to attend. Pt's breakfast just delivered. Supine>sitting L EOB with supervision. Sitting EOB ate meal while focusing on L attention, sitting balance, and L UE NMR - pt demos more automatic incorporation of LUE into tasks at this time. Pt noted to need increased time to complete meal therefore therapist exited room - upon return to room 85minutes later pt still eating with RT present for breathing treatment. Pt left sitting EOB with needs in reach, meal tray still set-up, and bed alarm on. Missed 30 minutes of skilled physical therapy.  Session 2: Pt received supine in bed with her husband, [name], and sister, Mingo Amber, present for family education/training. Pt  agreeable to therapy session. Therapist educated family on pt's 60 with bed mobility, transfers, short distance ambulation using RW, and 1 step navigation. Pt's husband provided hands-on assistance to patient today. Therapist educated on proper use of gait belt, proper positioning to provide assist, and to have pt wear tennis shoes in the home with standing/ambulation. Sit>stands to RW with CGA for steadying during session as provided by pt's husband - pt requires intermittent cuing to scoot forward initially, but integrates pushing up with L UE from chair/armrest without cuing. Short distance ambulatory transfer EOB>w/c using RW with pt's husband provided proper CGA/min assist for balance - therapist cuing both pt/husband on proper positioning, sequencing, and AD management - pt requires cuing to turn towards L due to inattention. Also, pt demos lack of sufficient L lateral step length during transfers resulting in narrow BOS and L lean - requires cuing for correction throughout session with pt's husband starting to provide this cue. Transported to/from gym in w/c for time management and energy conservation. Discussed home entry with pt's husband reporting he can pull the car up to the back porch so that pt can exit the vehicle and only walk a few steps in the grass until stepping up onto the porch using RW (versus entering other doorway with 4 steps and only L HR). Therapist educated pt/family on proper sequencing of AD management and LE stepping to ascend/descend 1 step. Pt stepped up/down on/off 1 6" curb using RW 1x with therapist's assistance and then 2x with her husband's assistance - requires CGA/min assist for balance and lifting/placing RW but by final repetition, demos significant improvement in  sequencing. Educated pt/family on setting up environment prior to initiating mobility and ensuring pt has adequate seated rest breaks due to impaired endurance. Throughout session, continues to require cuing for  orientation of items due to L inattention and pt frequently being "off center" biased towards R. Transported back to room and pt left sitting in w/c with family present and OT arriving.  Therapy Documentation Precautions:  Precautions Precautions: Fall Precaution Comments: L hemipareisis, L inattention, mild pushing to L, L field of vision cut Restrictions Weight Bearing Restrictions: No   Pain:  Session 1: No reports of pain throughout session.  Session 2: No reports of pain throughout session.    Therapy/Group: Individual Therapy  Tawana Scale , PT, DPT, NCS, CSRS 03/19/2021, 7:47 AM

## 2021-03-19 NOTE — Progress Notes (Signed)
Patient ID: Tonya [name], female   DOB: 1946-09-07, 75 y.o.   MRN: [id number]  SW provided list of [employer] agency's. Pt spouse will pick up.  Pt referral sent to [facility] follow up review.  [place], [place]

## 2021-03-19 NOTE — Progress Notes (Signed)
Speech Language Pathology Daily Session Note  Patient Details  Name: Tonya [name] MRN: [id number] Date of Birth: 1946/08/21  Today's Date: 03/19/2021 SLP Individual Time: 4008-6761 SLP Individual Time Calculation (min): 48 min  Short Term Goals: Week 3: SLP Short Term Goal 1 (Week 3): Patient will recall and describe specific therapeutic interventions with PT/OT/SLP with min-to-mod A cues. SLP Short Term Goal 2 (Week 3): Patient will demonstrate adequate reasoning and problem solving when completed simulated functional tasks with modA cues. SLP Short Term Goal 3 (Week 3): Patient will demonstrate consistent emergent awareness to deficits and limitations, with min-to-modA -cues. SLP Short Term Goal 4 (Week 3): Patient will perform basic-mild complex organizing and/or sequencing tasks with min-to-mod A cues.  Skilled Therapeutic Interventions: Patient was agreeable to skilled ST intervention with focus on cognitive goals and family education with patient's sister and spouse. Educated on current status from a cognitive-linguistic perspective, areas of strength, current challenges, and strategies to maximize cognitive function and safety. All questions were addressed. SLP facilitated cognitive task involving working [employer] retention and verbal reasoning with min-to-mod A verbal cues for recall and attention. Sup A for reasoning. Patient benefited from a low stim environment and became easily distracted with external stimuli (spouse's phone ringing, announcement over intercom, etc) although able to re-direct with min A verbal cues. Patient was left with PT at end of session. Continue per current plan of care.      Pain Pain Assessment Pain Scale: 0-10 Pain Score: 0-No pain  Therapy/Group: Individual Therapy  [name] 03/19/2021, 3:17 PM

## 2021-03-19 NOTE — Progress Notes (Signed)
Much more oriented this AM. [employer], calling for assistance to go to [facility]. 1 assist transfer using stedy. HS IVF's 7p-7a. Patrici Ranks A

## 2021-03-19 NOTE — Progress Notes (Signed)
Recreational Therapy Assessment and Plan  Patient Details  Name: Tonya [name] MRN: [id number] Date of Birth: 04/21/1946 Today's Date: 03/19/2021  Rehab Potential:  Good ELOS:   d/c 7/19  Assessment  Hospital Problem: Principal Problem:   Intraparenchymal hematoma of brain [facility])     Past Medical History:      Past Medical History:  Diagnosis Date   Adenoma of left adrenal gland     Cancer ([place])      Lung   Colon polyps      hyperplastic   Complication of anesthesia      Very emotional and cries after anesthesia   GERD (gastroesophageal reflux disease)     Kidney stone     Liver lesion     Microhematuria      Past Surgical History:       Past Surgical History:  Procedure Laterality Date   ABDOMINAL HYSTERECTOMY       APPENDECTOMY       BRONCHIAL BIOPSY   07/18/2020    Procedure: BRONCHIAL BIOPSIES;  Surgeon: [name], DO;  Location: [place] [facility];  Service: Pulmonary;;   BRONCHIAL BRUSHINGS   07/18/2020    Procedure: BRONCHIAL BRUSHINGS;  Surgeon: [name], DO;  Location: [place];  Service: Pulmonary;;   BRONCHIAL NEEDLE ASPIRATION BIOPSY   07/18/2020    Procedure: BRONCHIAL NEEDLE ASPIRATION BIOPSIES;  Surgeon: [name], DO;  Location: [place];  Service: Pulmonary;;   BRONCHIAL WASHINGS   07/18/2020    Procedure: BRONCHIAL WASHINGS;  Surgeon: [name], DO;  Location: [place] [facility];  Service: Pulmonary;;   FIDUCIAL MARKER PLACEMENT   07/18/2020    Procedure: FIDUCIAL MARKER PLACEMENT;  Surgeon: [name], DO;  Location: [place] [facility];  Service: Pulmonary;;   INTERCOSTAL NERVE BLOCK Right 08/26/2020    Procedure: INTERCOSTAL NERVE BLOCK;  Surgeon: [name], MD;  Location: [place];  Service: Thoracic;  Laterality: Right;   IR THORACENTESIS ASP PLEURAL SPACE W/IMG GUIDE   10/10/2020   NODE DISSECTION Right 08/26/2020    Procedure: NODE DISSECTION;  Surgeon: [name], MD;  Location: [place];   Service: Thoracic;  Laterality: Right;   THORACENTESIS Right 11/04/2020    Procedure: Mathews Robinsons;  Surgeon: [name], DO;  Location: [place] [facility];  Service: Pulmonary;  Laterality: Right;   THORACENTESIS Right 11/29/2020    Procedure: THORACENTESIS;  Surgeon: [name], DO;  Location[facility];  Service: Pulmonary;  Laterality: Right;   VIDEO BRONCHOSCOPY WITH ENDOBRONCHIAL NAVIGATION N/A 07/18/2020    Procedure: VIDEO BRONCHOSCOPY WITH ENDOBRONCHIAL NAVIGATION;  Surgeon: [name], DO;  Location: [place];  Service: Pulmonary;  Laterality: N/A;   VIDEO BRONCHOSCOPY WITH ENDOBRONCHIAL ULTRASOUND   07/18/2020    Procedure: VIDEO BRONCHOSCOPY WITH ENDOBRONCHIAL ULTRASOUND;  Surgeon: [name], DO;  Location: [facility];  Service: Pulmonary;;      Assessment & Plan Clinical Impression: Patient is a 75 y.o. female with h/o Lung cancer s/p RUL lobectomy with ongoing chemo and bradycardia in the past who was evaluated at OSH on 02/22/21 after presenting with reports of HA followed by confusion. Per teleneurology report patient progressed to "stuporous state, was non verbal with right gaze preference and had left>right sided weakness" CT head showed large intraparenchymal hemorrhage within right parietal lobe as well as mild right frontal subarachnoid hemorrhage and patient had tremulousness with concerns of seizure activity.  UDS positive for benzos and THC.  She was intubated for airway  protection and transferred to [facility] for management.  CT head showed IPH in high parietal lobe with subarachnoid extension over right convexity and basal cisterns.  Dr. Venetia Constable consulted for input and recommended follow-up MRI/MRA to rule out underlying mass or vascular abnormality.   MRI/MRA brain done showing stable appearance of right parietal hemorrhage without underlying mass or vascular malformation, subdural and subarachnoid hemorrhage and small amount of blood  within the ventricles without evidence of stenosis, aneurysm or occlusion.  MRV of the head was negative.  She was noted to have right upper extremity tremors on 06/19 and was started on Keppra.  EEG done showing cortical dysfunction right hemisphere secondary to underlying stroke and severe diffuse encephalopathy felt to be nonspecific but likely related to sedation.  She tolerated extubation without difficulty on 06/20 and MBS done showing mild esophageal dysphagia with intact airway protection therefore started on regular diet. She has had issues with tachycardia with heart rates up to 140s and beta-blocker added for rate control.  2D echo showed EF 60 to 65% with moderate LVH and grade 1 DD.  Blood pressures noted to be elevated and medications added for better control. Dr. Leonie Man felt that bleed was likely hypertensive in nature but to repeat MRI brain in 2 to 3 months due to unclear etiology of bleed. Her lethargy has resolved and patient with resultant left-sided weakness with right gaze preference, balance deficits with left inattention and strong left lean as well as high-level cognitive deficits.  Therapy ongoing and CIR was recommended due to functional decline..  Patient transferred to CIR on 02/27/2021.   Met with pt to discuss leisure interests, activity analysis/modifications and relaxation strategies.  Pt stated that she was very independent PTA and was having difficulty identifying how to use time once home and modify preferred activities.  Pt tearful when discussing loss of independence and discharge planning. Pt presents with decreased activity tolerance, decreased functional mobility, decreased balance, decreased coordination, decreased vision, left inattention, , decreased attention, decreased awareness, decreased problem solving, decreased safety awareness, decreased memory, feeling of stress Limiting pt's independence with leisure/community pursuits. . Plan  Min 1 TR session per week  during LOS  Recommendations for other services: None   Discharge Criteria: Patient will be discharged from TR if patient refuses treatment 3 consecutive times without medical reason.  If treatment goals not met, if there is a change in medical status, if patient makes no progress towards goals or if patient is discharged from hospital.  The above assessment, treatment plan, treatment alternatives and goals were discussed and mutually agreed upon: by patient  [place] 03/19/2021, 3:53 PM

## 2021-03-19 NOTE — Progress Notes (Signed)
Patient ID: Tonya [name], female   DOB: 03-Oct-1945, 75 y.o.   MRN: [id number] Team Conference Report to Patient/Family  Team Conference discussion was reviewed with the patient and caregiver, including goals, any changes in plan of care and target discharge date.  Patient and caregiver express understanding and are in agreement.  The patient has a target discharge date of 03/25/21.  Pt has family present today. SW called pt spouse to provide conference updates. He reports he will be here today. Patient making significant progress this week. Attempting steps with PT. No additional questions or concerns, sw will continue to follow up.   Dyanne Iha 03/19/2021, 11:35 AM

## 2021-03-19 NOTE — Plan of Care (Addendum)
  Problem: RH Wheelchair Mobility Goal: LTG Patient will propel w/c in controlled environment (PT) Description: LTG: Patient will propel wheelchair in controlled environment, # of feet with assist (PT) Outcome: Not Applicable Flowsheets (Taken 03/19/2021 1932) LTG: Pt will propel w/c in controlled environ  assist needed:: (N/A) -- Note: N/A Problem: RH Wheelchair Mobility Goal: LTG Patient will propel w/c in home environment (PT) Description: LTG: Patient will propel wheelchair in home environment, # of feet with assistance (PT). 03/20/2021 1839 by Tawana Scale, PT Flowsheets (Taken 03/20/2021 1839) LTG: Pt will propel w/c in home environ  assist needed:: (Updated goal to promote pt increased independence with functional mobility in the home) Supervision/Verbal cueing LTG: Propel w/c distance in home environment: 50ft Note: Updated goal to promote pt increased independence with functional mobility in the home 03/20/2021 1839 by Tawana Scale, PT   Problem: RH Stairs Goal: LTG Patient will ambulate up and down stairs w/assist (PT) Description: LTG: Patient will ambulate up and down # of stairs with assistance (PT) Flowsheets (Taken 03/19/2021 1932) LTG: Pt will ambulate up/down stairs assist needed:: (updated to reflect pt's home entry) Contact Guard/Touching assist LTG: Pt will  ambulate up and down number of stairs: at least 1 step using RW for home entry Note: updated to reflect pt's home entry

## 2021-03-19 NOTE — Patient Care Conference (Signed)
Inpatient RehabilitationTeam Conference and Plan of Care Update Date: 03/19/2021   Time: 10:46 AM    Patient Name: Tonya [name]      Medical Record Number: [id number]  Date of Birth: 01/12/1946 Sex: Female         Room/Bed: 4W08C/4W08C-01 Payor Info: Payor: [employer] MEDICARE / Plan: [place] HMO / Product Type: *No Product type* /    Admit Date/Time:  02/27/2021  4:16 PM  Primary Diagnosis:  Intraparenchymal hematoma of brain [facility])  Hospital Problems: Principal Problem:   Intraparenchymal hematoma of brain [facility])    Expected Discharge Date: Expected Discharge Date: 03/25/21  Team Members Present: Physician leading conference: Dr. Alysia Penna Care Coodinator Present: Dorien Chihuahua, RN, BSN, CRRN;Christina [place], [place] Nurse Present: Dorien Chihuahua, RN PT Present: Page Spiro, PT OT Present: Clyda Greener, OT SLP Present: Other (comment) Herbert Deaner, SLP) PPS Coordinator present : Gunnar Fusi, SLP     Current Status/Progress Goal Weekly Team Focus  Bowel/Bladder             Swallow/Nutrition/ Hydration             ADL's   UB bathing/dressing min A, LB bathing/dressing mod A, toileting min A, SPT min A.  improving posture and left arm strength and coordination  min assist overall  adl and transfer training, visual/persceptual skills training, family education   Mobility   CGA/min assist bed mobility, min assist sit<>stand and stand pivot transfers using RW, min/mod assist gait up to 78ft using RW, min assist car transfer simulator using RW - pt continues to demo L inattention, impaired awareness, and impaired motor planning along with L hemiparesis; however, has had significant improvement since last week  CGA/min assist overall at ambulatory level  activity tolerance, pt/family education, L attention, dynamic standing balance, L LE NMR, transfer training, gait training, discharge planning   Communication             Safety/Cognition/  Behavioral Observations  Mod A for attention, problem solving, working memory/short-term recall. Improvement noted with sustained attention and awareness, although this becomes limited when patient is fatigued. Family ed complete  Min A for memory, attention, problem solving  sustained attention, functional problem solving, short-term recall, working memory   Pain             Skin               Discharge Planning:  Discharging home with spouse and daughters to care. Family education scheduled   Team Discussion: HA post CVA management with Topamax. BPs on the low side; MD adjusting medications. Patient on target to meet rehab goals: yes, currently min assist for upper body bathing and dressing. Min assist for toileting without a RW. Continue to note left field cut vs neglect. Note some left upper extremity return in function. Increased sustained attention and cognitive endurance; more aware of deficits. Requires CGA for sit to stand and stand pivot transfers. Able to ambulate 52' with min assist using a rolling walker. Continue to note decreased left foot clearance. Discharge goals set for min assist level.  *See Care Plan and progress notes for long and short-term goals.   Revisions to Treatment Plan:  Working on steps/left foot clearance   Teaching Needs: Transfers, toileting, medications, secondary stroke risk management, etc  Current Barriers to Discharge: Decreased caregiver support and Home enviroment access/layout  Possible Resolutions to Barriers: Family education HH follow up services recommended Recommend walker and rental w/c    Medical Summary  Current Status: Hydration improving with IVF     Possible Resolutions to Barriers/Weekly Focus: needs to wean off IVF for home D/C, monitor BMET, adjust HA prophyllactic medications   Continued Need for Acute Rehabilitation Level of Care: The patient requires daily medical management by a physician with specialized training in  physical medicine and rehabilitation for the following reasons: Direction of a multidisciplinary physical rehabilitation program to maximize functional independence : Yes Medical management of patient stability for increased activity during participation in an intensive rehabilitation regime.: Yes Analysis of laboratory values and/or radiology reports with any subsequent need for medication adjustment and/or medical intervention. : Yes   I attest that I was present, lead the team conference, and concur with the assessment and plan of the team.   Margarito Liner 03/19/2021, 2:23 PM

## 2021-03-19 NOTE — Progress Notes (Signed)
Occupational Therapy Session Note  Patient Details  Name: Tonya [name] MRN: [id number] Date of Birth: 09/21/1945  Today's Date: 03/19/2021 OT Individual Time: 1224-8250 OT Individual Time Calculation (min): 58 min    Short Term Goals: Week 3:  OT Short Term Goal 1 (Week 3): Pt will complete UB dressing with mod assist following hemi techniques. OT Short Term Goal 2 (Week 3): Pt will complete LB bathing with mod assist sit to stand. OT Short Term Goal 3 (Week 3): Pt will donn pull up pants sit to stand with mod assist.  Skilled Therapeutic Interventions/Progress Updates:    Pt's spouse and sister in for education this session.  Her sister just observed as she will not be helping a lot at discharge.  Pt's spouse assisted with toilet transfers and tub/shower transfers with use of the RW.  She was able to complete functional mobility to the 3:1 from the wheelchair at bedside with min assist and mod demonstrational cueing for staying inside of the walker and positioning her hand on the hand grip on the left side of the walker.  Her spouse assisted with mobility coming back out of the bathroom with the RW, where she continues to need mod demonstrational cueing to square herself up with the RW and the surface she is sitting on.  Next, took her down to the tub/shower room where she completed tub transfers with use of the tub bench and pt having to side step into the door opening at the RW may not be able to fit through going forward.  Mod to max demonstrational cueing with min assist was needed to complete transfer on and off of the tub bench.  Pt did a better job when attempting to step sideways out of the bathroom door compared to when she was coming in the door.  Finished session with return to the room via wheelchair and stand pivot transfer to the bed with mod assist and no device.  Pt did not turn to the bed on the left side but instead kept leaning forward with scissoring of LEs and mod  assist to regain balance and reposition her feet.  Min assist for lifting the LLE into the bed with transition to supine as well.  Call button and phone in reach.    Therapy Documentation Precautions:  Precautions Precautions: Fall Precaution Comments: L hemipareisis, L inattention, mild pushing to L, L field of vision cut Restrictions Weight Bearing Restrictions: No   Pain: Pain Assessment Pain Scale: 0-10 Pain Score:  (12) Faces Pain Scale: No hurt Pain Type: Acute pain Pain Location: Buttocks ADL: See Care Tool Section for some details of mobility and selfcare  Therapy/Group: Individual Therapy  Asaf Elmquist OTR/L 03/19/2021, 4:06 PM

## 2021-03-19 NOTE — Progress Notes (Signed)
PROGRESS NOTE   Subjective/Complaints: Pt working with TR this am  No issues overnite, discussed no driving post d/c and steps needed to get back to that activity   ROS: Patient denies CP, SOB, N/V/D +migraines, +coccyx pain  Objective:   No results found. Recent Labs    03/17/21 0834  WBC 8.2  HGB 9.1*  HCT 28.0*  PLT 201     Recent Labs    03/17/21 0834  NA 135  K 3.6  CL 109  CO2 19*  GLUCOSE 148*  BUN 19  CREATININE 0.83  CALCIUM 9.5       Intake/Output Summary (Last 24 hours) at 03/19/2021 1049 Last data filed at 03/19/2021 0304 Gross per 24 hour  Intake 1892.08 ml  Output --  Net 1892.08 ml         Physical Exam: Vital Signs Blood pressure 99/62, pulse 68, temperature 98.6 F (37 C), resp. rate 16, height 5\' 4"  (1.626 m), weight 56.4 kg, SpO2 99 %.   General: No acute distress Mood and affect are appropriate Heart: Regular rate and rhythm no rubs murmurs or extra sounds Lungs: Clear to auscultation, breathing unlabored, no rales or wheezes Abdomen: Positive bowel sounds, soft nontender to palpation, nondistended Extremities: No clubbing, cyanosis, or edema Skin: No evidence of breakdown, no evidence of rash  Neurologic: alert. Follows basic commands.  motor strength is 5/5 in right. 3-/5 left upper: deltoid, bicep, tricep, grip. LLE:  trace hip flexor, knee extensors, ankle dorsiflexor and plantar flexor Sensory exam normal sensation to light touch RIght and absent left  upper and lower extremities Musculoskeletal: Full range PROM in all 4 extremities.  Has pain with palpation and tightness in upper traps, neck, occiput     Assessment/Plan: 1. Functional deficits which require 3+ hours per day of interdisciplinary therapy in a comprehensive inpatient rehab setting. Physiatrist is providing close team supervision and 24 hour management of active medical problems listed below. Physiatrist  and rehab team continue to assess barriers to discharge/monitor patient progress toward functional and medical goals  Care Tool:  Bathing    Body parts bathed by patient: Left arm, Chest, Abdomen, Right upper leg, Left upper leg, Face, Right arm, Front perineal area, Buttocks   Body parts bathed by helper: Right lower leg, Left lower leg, Right arm, Front perineal area, Buttocks Body parts n/a: Right upper leg, Left upper leg, Buttocks, Front perineal area, Left lower leg, Right lower leg (did not attempt)   Bathing assist Assist Level: Contact Guard/Touching assist     Upper Body Dressing/Undressing Upper body dressing   What is the patient wearing?: Pull over shirt    Upper body assist Assist Level: Minimal Assistance - Patient > 75%    Lower Body Dressing/Undressing Lower body dressing      What is the patient wearing?: Pants     Lower body assist Assist for lower body dressing: Moderate Assistance - Patient 50 - 74%     Toileting Toileting    Toileting assist Assist for toileting: Minimal Assistance - Patient > 75%     Transfers Chair/bed transfer  Transfers assist  Chair/bed transfer activity did not occur: Safety/medical concerns  Chair/bed transfer assist level: Minimal Assistance - Patient > 75% (stand pivot) Chair/bed transfer assistive device: Walker, Clinical biochemist   Ambulation assist   Ambulation activity did not occur: Refused  Assist level: Minimal Assistance - Patient > 75% Assistive device: Walker-rolling Max distance: 20ft   Walk 10 feet activity   Assist  Walk 10 feet activity did not occur: Refused  Assist level: Minimal Assistance - Patient > 75% Assistive device: Walker-rolling   Walk 50 feet activity   Assist Walk 50 feet with 2 turns activity did not occur: Safety/medical concerns  Assist level: Minimal Assistance - Patient > 75% Assistive device: Walker-rolling    Walk 150 feet activity   Assist  Walk 150 feet activity did not occur: Safety/medical concerns         Walk 10 feet on uneven surface  activity   Assist Walk 10 feet on uneven surfaces activity did not occur: Safety/medical concerns         Wheelchair     Assist Will patient use wheelchair at discharge?: Yes Type of Wheelchair: Manual      Max wheelchair distance: 20 ft    Wheelchair 50 feet with 2 turns activity    Assist        Assist Level: Maximal Assistance - Patient 25 - 49%   Wheelchair 150 feet activity     Assist      Assist Level: Dependent - Patient 0%, Total Assistance - Patient < 25%   Blood pressure 99/62, pulse 68, temperature 98.6 F (37 C), resp. rate 16, height 5\' 4"  (1.626 m), weight 56.4 kg, SpO2 99 %.    Medical Problem List and Plan: 1. 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  Antithrombotics: -DVT/anticoagulation:  Mechanical: Sequential compression devices, below knee Bilateral lower extremities             -antiplatelet therapy: N/A 3. Headaches/Pain Management: Oxycodone prn for HA. Recommended minimizing soft drinks and added sugar in diet and she is agreeable. Provided advice regarding drinking coffee at regular time every morning and eliminating other sources of caffeine during the day.              --see below 4. Anxiety: LCSW to follow for evaluation and support. Recommended to continue to minimize use of Ativan due to negative effects of long term use on cognition.             -antipsychotic agents: N/A 5. Neuropsych: This patient is not capable of making decisions on her own behalf. 6. Skin/Wound Care: Routine pressure relief measures. D/c IV after magnesium supplementation as per patient request 7.  Fluids/Electrolytes/Nutrition: Monitor I/O. Check CMET in am. 8. HTN: Monitor BP tid--currently on Lisinopril.Marland Kitchen --BP lower but no dizziness  -stop lisinopril and hold off on tizanidine as well  -encouraging fluids, HS IVF  -TEDS Vitals:   03/19/21 0405 03/19/21 0853  BP: 99/62   Pulse: 68   Resp: 16   Temp: 98.6 F (37 C)   SpO2: 100% 99%   BP improving on IVF 7/13  Off BP meds 9.  Seizure prophylaxis: As per discharge summary, neurology recommended discontinuing Keppra in case it was contributing to hallucinations, but states that hallucinations were present prior to admission and patient has been tremulous/twitchy raising some concern for seizure. 6/19 to 6/20 showed no evidence of seizures. Consider discontinuing 6/25 one week after ICH if remains seizure free. d/c'ed 6/27 10. Intermittent fevers: Being monitored and have resolved without treatment. 11. Anemia of chronic illness?: Continue to monitor H/H with serial checks--around 9 since 12/2020.expect some drop with IVF fluids due to hemodilution  12. Hypomagnesemia: Resume home dose of supplement. IV supplementation on 6/25 13. Hypokalemia: Will add daily supplement.just added KCL  14. 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  Freq BM reduced senna S to 1 po qhs 16.   HA : likely vascular and tension/muscular -continue topamax 50mg  bid, will increase again to 75mg  BID -off tizanidine d/t hypotension  -oxycodone as needed -added lidocaine patches for neck/upper shoulders 17.  Pre renal azotemia. Continue IVF at [facility], encourage fluids -some improvement in 7/7 labs -recheck BMET 7/11 much improved  18. Left sided sciatica: start gabapentin 100mg  TID 19. Insomnia: consider melatonin if sleep does not improve with gabapentin.  20. Coccyx pain after fall: XR obtained and stable.   LOS: 20 days A FACE TO FACE EVALUATION WAS  PERFORMED  Charlett Blake 03/19/2021, 10:49 AM

## 2021-03-19 NOTE — Progress Notes (Signed)
Physical Therapy Weekly Progress Note  Patient Details  Name: RONNETTE RUMP MRN: [id number] Date of Birth: [birth year]  Beginning of progress report period: March 13, 2021 End of progress report period: March 19, 2021  Patient has met 4 of 4 short term goals.  Ms. Sadowski has demonstrated significant improvement in functional mobility level this past week due to increased participation and overall increased activity tolerance. She is performing supine<>sit with CGA/supervision, sit<>stand and stand pivot transfers using RW with CGA/min assist, and ambulating up to 39ft using RW with min assist, as well as navigating 1 step using RW with min assist. She is demonstrating improving L hemiparesis, increasing L attention, and improving motor planing although still shows impairments with all of these. Based on pt's significant progress this past week therapist is recommending rental wheelchair upon D/C at this time to allow pt continued time for further increase in independent functional mobility at ambulatory level - pt/family in agreement with this plan. She will benefit from continued CIR level therapies to further advance functional mobility level prior to D/C home with 24hr support from family.   Patient continues to demonstrate the following deficits muscle weakness, decreased cardiorespiratoy endurance and decreased oxygen support, impaired timing and sequencing, unbalanced muscle activation, decreased coordination, and decreased motor planning, decreased attention to left, decreased initiation, decreased attention, decreased awareness, decreased problem solving, decreased safety awareness, decreased memory, and delayed processing, and decreased sitting balance, decreased standing balance, decreased postural control, hemiplegia, and decreased balance strategies and therefore will continue to benefit from skilled PT intervention to increase functional independence with mobility.  Patient  progressing toward long term goals. Discontinued wheelchair level goals as pt will D/C at household ambulatory level. Continue plan of care.  PT Short Term Goals Week 3:  PT Short Term Goal 1 (Week 3): Pt will perform supine<>sit with min assist PT Short Term Goal 1 - Progress (Week 3): Met PT Short Term Goal 2 (Week 3): Pt will perform sit<>stands using LRAD with min assist PT Short Term Goal 2 - Progress (Week 3): Met PT Short Term Goal 3 (Week 3): Pt will perform bed<>chair transfers using LRAD with min asist PT Short Term Goal 3 - Progress (Week 3): Met PT Short Term Goal 4 (Week 3): Pt will ambulate at least 46ft using LRAD with mod assist of 1 PT Short Term Goal 4 - Progress (Week 3): Met Week 4:  PT Short Term Goal 1 (Week 4): = to LTGs based on ELOS  Skilled Therapeutic Interventions/Progress Updates:  Ambulation/gait training;Balance/vestibular training;Cognitive remediation/compensation;Community reintegration;Discharge planning;Disease management/prevention;DME/adaptive equipment instruction;Functional electrical stimulation;Functional mobility training;Neuromuscular re-education;Pain management;Patient/family education;Psychosocial support;Splinting/orthotics;Stair training;Therapeutic Activities;Therapeutic Exercise;UE/LE Strength taining/ROM;UE/LE Coordination activities;Visual/perceptual remediation/compensation;Wheelchair propulsion/positioning;Skin care/wound management   Therapy Documentation Precautions:  Precautions Precautions: Fall Precaution Comments: L hemipareisis, L inattention, mild pushing to L, L field of vision cut Restrictions Weight Bearing Restrictions: No   Tawana Scale , PT, DPT, NCS, CSRS 03/19/2021, 7:25 PM

## 2021-03-20 NOTE — Progress Notes (Signed)
Occupational Therapy Session Note  Patient Details  Name: Tonya [name] MRN: [id number] Date of Birth: 1946-03-06  Today's Date: 03/20/2021 OT Individual Time: 5364-6803 OT Individual Time Calculation (min): 56 min    Short Term Goals: Week 3:  OT Short Term Goal 1 (Week 3): Pt will complete UB dressing with mod assist following hemi techniques. OT Short Term Goal 2 (Week 3): Pt will complete LB bathing with mod assist sit to stand. OT Short Term Goal 3 (Week 3): Pt will donn pull up pants sit to stand with mod assist.  Skilled Therapeutic Interventions/Progress Updates:    Pt worked on shower and dressing during session.  Supervision for supine to sit EOB and min assist for ambulation to the shower with use of the RW.  Mod demonstrational cueing to direct walker to the left as she was walking past the door.  She was able to remove her clothing prior to shower with min assist and increased time.  Min instructional cueing for hemi technique to remove pullover shirt.  She worked on shower with spontaneous use of the LUE throughout task.  She completed all bathing in sitting with min guard assist and lateral lean to the left for washing her buttocks.  She transferred to the wheelchair at min assist for dressing tasks at the sink.  Educated pt and spouse on hemi techniques and need for visual scanning to the left to locate all parts of the shirt secondary to hemi neglect and field cut.  Min assist for donning pullover with mod assist for donning brief and pants sit to stand.  She was able to donn her socks and shoes with min assist including tying them.  Finished session with pt in the wheelchair and safety belt in place.  Pt's spouse present as well.   Therapy Documentation Precautions:  Precautions Precautions: Fall Precaution Comments: L hemipareisis, L inattention, mild pushing to L, L field of vision cut Restrictions Weight Bearing Restrictions: No  Pain: Pain Assessment Pain  Scale: 0-10 Pain Score: 5  Faces Pain Scale: No hurt Pain Type: Acute pain Pain Location: Head Pain Descriptors / Indicators: Aching (Headache and backache) Pain Intervention(s): Medication (See eMAR);Repositioned;Ambulation/increased activity ADL: See Care Tool Section for some details of mobility and selfcare   Therapy/Group: Individual Therapy  Velton Roselle OTR/L 03/20/2021, 12:17 PM

## 2021-03-20 NOTE — Progress Notes (Signed)
Physical Therapy Session Note  Patient Details  Name: Tonya [name] MRN: [id number] Date of Birth: 08/01/1946  Today's Date: 03/20/2021 PT Individual Time: 1430-1500 PT Individual Time Calculation (min): 30 min   Short Term Goals: Week 4:  PT Short Term Goal 1 (Week 4): = to LTGs based on ELOS  Skilled Therapeutic Interventions/Progress Updates:    Pt greeted supine in bed, sleeping on arrival. Awakens to voice and agreeable to PT tx. Reports mild lower back pain which she contributes to her fall OOB ~1 week ago. No acute distress during session and treatment to tolerance. Completed supine<>sit with supervision with HOB elevated. Donned tennis shoes with totalA for time management and completed squat<>pivot transfer with CGA from EOB to w/c. Required cues for repositioning in chair. Wheeled with totalA during session for time management in transported to ADL apartment room. Sit<>stand to RW with CGA from w/c height and ambulated in ADL apartment room on carpet with CGA (minA for turns). Practiced furniture transfers to low sitting sofa couch and she required mod cues for approach and CGA for sit<>stands. Instructed her to locate x3 items in kitchen and she was able to recall all 3 of the items and locate them in cabinets with CGA and RW. She required cues while navigating kitchen environment for keeping close proximity to RW and to not abandon it as she reached for objects in cabinets. She was returned to her room in w/c and assisted back to bed via stand<>pivot with CGA and no AD (used bed rails). She required minA for sit>supine for LLE management and cues for repositioning in bed for safety. Bed alarm set and all needs met at end of session.  Therapy Documentation Precautions:  Precautions Precautions: Fall Precaution Comments: L hemipareisis, L inattention, mild pushing to L, L field of vision cut Restrictions Weight Bearing Restrictions: No General:    Therapy/Group:  Individual Therapy  Alger Simons 03/20/2021, 7:41 AM

## 2021-03-20 NOTE — Progress Notes (Signed)
Physical Therapy Session Note  Patient Details  Name: Tonya [name] MRN: [id number] Date of Birth: 31-Mar-1946  Today's Date: 03/20/2021 PT Individual Time: 3846-6599 PT Individual Time Calculation (min): 73 min   Short Term Goals: Week 3:  PT Short Term Goal 1 (Week 3): Pt will perform supine<>sit with min assist PT Short Term Goal 1 - Progress (Week 3): Met PT Short Term Goal 2 (Week 3): Pt will perform sit<>stands using LRAD with min assist PT Short Term Goal 2 - Progress (Week 3): Met PT Short Term Goal 3 (Week 3): Pt will perform bed<>chair transfers using LRAD with min asist PT Short Term Goal 3 - Progress (Week 3): Met PT Short Term Goal 4 (Week 3): Pt will ambulate at least 6ft using LRAD with mod assist of 1 PT Short Term Goal 4 - Progress (Week 3): Met Week 4:  PT Short Term Goal 1 (Week 4): = to LTGs based on ELOS  Skilled Therapeutic Interventions/Progress Updates:    Pt received supine in bed and agreeable to therapy. Pt states she has a headache and backache this morning. Pt reports she received medication for pain prior to start of session.    Pt transitioned supine to sitting EOB with CGA and verbal cuing for scooting to place feet on floor. Therapist donned shoes for time management. Gait belt placed prior to initiation of OOB mobility. Husband present during session.   Sit to stands throughout session CGA. Verbal cuing for proper body mechanics and locating arm rest for LUE  Standing balance during visual scanning to the left and reaching LUE with BITS. 4 bouts of standing with RUE support on RW CGA. ABC's used for first two rounds followed by ABC and numbers. Each round increased in number of targets to build endurance. Verbal/tactile cuing for activation of LLE quad and glutes to improve standing posture/WBing.  Pt required more consistent cuing with fatigue. One instance of heavy ModA to correct balance and feet placement secondary to fatigue and reach to  right upper corner.   Gait training in hall with ACE DF assist donned. Multimodal cuing for increased step width with LLE and increased step length with RLE for step through gait pattern throughout gait training.  Forward ambulation CGA ~9ft with turn (towards right) half way. Last ~92ft ModA d/t fatigue with manual facilitation weight shifting. Increased cuing for sequencing BLE and RW/weight shifting during turns.  Lateral stepping MinA at railing in hall ~12ft x2 / ~21ft x2 each direction. Pt required verbal/tactile cuing of attention to LUE/LLE when stepping to the right. Verbal cuing for left quad/glute activation with stance phase on LLE. Pt ambulated ~72ft into room to the bed CGA. Noted improvement in continuous step through gait pattern and required less physical assistance post rest break.    Frequent rest breaks throughout session secondary to fatigue.   Pt transitioned sitting EOB to right sidelying with minA for terminal transition of LLE. Pillows placed between knees and under LUE for improved positioning. Bed alarm on and call bell within reach at the end of session.   Therapy Documentation Precautions:  Precautions Precautions: Fall Precaution Comments: L hemipareisis, L inattention, mild pushing to L, L field of vision cut Restrictions Weight Bearing Restrictions: No  Pain: Pain Assessment Pain Scale: 0-10 Pain Score: 5  Faces Pain Scale: No hurt Pain Type: Acute pain Pain Location: Head Pain Descriptors / Indicators: Aching (Headache and backache) Pain Intervention(s): Medication (See eMAR);Repositioned;Ambulation/increased activity    Therapy/Group: Individual Therapy  Graden Hoshino, SPT 03/20/2021, 11:24 AM

## 2021-03-20 NOTE — Progress Notes (Signed)
Speech Language Pathology Daily Session Note  Patient Details  Name: Tonya [name] MRN: [id number] Date of Birth: May 20, 1946  Today's Date: 03/20/2021 SLP Individual Time: 1300-1345 SLP Individual Time Calculation (min): 45 min  Short Term Goals: Week 3: SLP Short Term Goal 1 (Week 3): Patient will recall and describe specific therapeutic interventions with PT/OT/SLP with min-to-mod A cues. SLP Short Term Goal 2 (Week 3): Patient will demonstrate adequate reasoning and problem solving when completed simulated functional tasks with modA cues. SLP Short Term Goal 3 (Week 3): Patient will demonstrate consistent emergent awareness to deficits and limitations, with min-to-modA -cues. SLP Short Term Goal 4 (Week 3): Patient will perform basic-mild complex organizing and/or sequencing tasks with min-to-mod A cues.  Skilled Therapeutic Interventions: Patient agreeable to skilled ST intervention with focus on cognitive goals. SLP facilitated the following mental manipulation tasks: Patient was given a category and asked to complete words despite intentionally missing letters (category: clothing. Word: Bl_ _ s _ (answer=blouse). Patient completed with min A verbal or written cues for success. Patient unscrambled words with min A verbal or visual cues to highlight the first letter. Minimal difficulty observed with visual scanning while reading single words on dry erase board during the above tasks. Patient was able to sustain attention during 35 minutes with min A cues for re-direction. Patient was known to perseverate on a topic at the beginning session but able to redirect and sustain attention on therapy tasks moving forward. Patient was left in bed with alarm activated and needs within reach at end of session. Continue per ST plan of care.    Pain Pain Assessment Pain Scale: 0-10 Pain Score: 7  Pain Type: Acute pain Pain Location: Back Pain Descriptors / Indicators: Aching (Headache and  backache) Pain Intervention(s): Medication (See eMAR);Repositioned  Therapy/Group: Individual Therapy  [name] 03/20/2021, 1:42 PM

## 2021-03-20 NOTE — Progress Notes (Signed)
PROGRESS NOTE   Subjective/Complaints: No issues overnight, no headache complaints. Gets tired after therapy still needs 1 person physical assistance Discussed left ankle bracing needs with physical therapy.  Holding off on orthotic consult due to improvements in functional abilities.  ROS: Patient denies CP, SOB, N/V/D   Objective:   No results found. No results for input(s): WBC, HGB, HCT, PLT in the last 72 hours.   No results for input(s): NA, K, CL, CO2, GLUCOSE, BUN, CREATININE, CALCIUM in the last 72 hours.     Intake/Output Summary (Last 24 hours) at 03/20/2021 0944 Last data filed at 03/20/2021 0836 Gross per 24 hour  Intake 60 ml  Output --  Net 60 ml         Physical Exam: Vital Signs Blood pressure (!) 104/55, pulse 68, temperature 98.2 F (36.8 C), temperature source Oral, resp. rate 17, height 5\' 4"  (1.626 m), weight 56.4 kg, SpO2 100 %.   General: No acute distress Mood and affect are appropriate Heart: Regular rate and rhythm no rubs murmurs or extra sounds Lungs: Clear to auscultation, breathing unlabored, no rales or wheezes Abdomen: Positive bowel sounds, soft nontender to palpation, nondistended Extremities: No clubbing, cyanosis, or edema Skin: No evidence of breakdown, no evidence of rash  Neurologic: alert. Follows basic commands.  motor strength is 5/5 in right upper and lower limb.  3-/5 left upper: deltoid, bicep, tricep, grip. LLE: 3 - hip flexor, knee extensors, ankle dorsiflexor and plantar flexor, 3/5 ankle inversion eversion on the right side. Sensory exam normal sensation to light touch RIght and absent left  upper and lower extremities Musculoskeletal: Full range PROM in all 4 extremities.  Has pain with palpation and tightness in upper traps, neck, occiput     Assessment/Plan: 1. Functional deficits which require 3+ hours per day of interdisciplinary therapy in a comprehensive  inpatient rehab setting. Physiatrist is providing close team supervision and 24 hour management of active medical problems listed below. Physiatrist and rehab team continue to assess barriers to discharge/monitor patient progress toward functional and medical goals  Care Tool:  Bathing    Body parts bathed by patient: Left arm, Chest, Abdomen, Right upper leg, Left upper leg, Face, Right arm, Front perineal area, Buttocks   Body parts bathed by helper: Right lower leg, Left lower leg, Right arm, Front perineal area, Buttocks Body parts n/a: Right upper leg, Left upper leg, Buttocks, Front perineal area, Left lower leg, Right lower leg (did not attempt)   Bathing assist Assist Level: Contact Guard/Touching assist     Upper Body Dressing/Undressing Upper body dressing   What is the patient wearing?: Pull over shirt    Upper body assist Assist Level: Minimal Assistance - Patient > 75%    Lower Body Dressing/Undressing Lower body dressing      What is the patient wearing?: Pants     Lower body assist Assist for lower body dressing: Moderate Assistance - Patient 50 - 74%     Toileting Toileting    Toileting assist Assist for toileting: Minimal Assistance - Patient > 75%     Transfers Chair/bed transfer  Transfers assist  Chair/bed transfer activity did not occur:  Safety/medical concerns  Chair/bed transfer assist level: Minimal Assistance - Patient > 75% Chair/bed transfer assistive device: Armrests, Programmer, multimedia   Ambulation assist   Ambulation activity did not occur: Refused  Assist level: Contact Guard/Touching assist Assistive device: Walker-rolling Max distance: 58ft   Walk 10 feet activity   Assist  Walk 10 feet activity did not occur: Refused  Assist level: Contact Guard/Touching assist Assistive device: Walker-rolling   Walk 50 feet activity   Assist Walk 50 feet with 2 turns activity did not occur: Safety/medical  concerns  Assist level: Minimal Assistance - Patient > 75% Assistive device: Walker-rolling    Walk 150 feet activity   Assist Walk 150 feet activity did not occur: Safety/medical concerns         Walk 10 feet on uneven surface  activity   Assist Walk 10 feet on uneven surfaces activity did not occur: Safety/medical concerns         Wheelchair     Assist Will patient use wheelchair at discharge?: Yes Type of Wheelchair: Manual      Max wheelchair distance: 20 ft    Wheelchair 50 feet with 2 turns activity    Assist        Assist Level: Maximal Assistance - Patient 25 - 49%   Wheelchair 150 feet activity     Assist      Assist Level: Dependent - Patient 0%, Total Assistance - Patient < 25%   Blood pressure (!) 104/55, pulse 68, temperature 98.2 F (36.8 C), temperature source Oral, resp. rate 17, height 5\' 4"  (1.626 m), weight 56.4 kg, SpO2 100 %.    Medical Problem List and Plan: 1. 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  Antithrombotics: -DVT/anticoagulation:  Mechanical: Sequential compression devices, below knee Bilateral lower extremities             -antiplatelet therapy: N/A 3. Headaches/Pain Management: Oxycodone prn for HA. Recommended minimizing soft drinks and added sugar in diet and she is agreeable. Provided advice regarding drinking coffee at regular time every morning and eliminating other sources of caffeine during the day.              --see below 4. Anxiety: LCSW to follow for evaluation and support. Recommended to continue to minimize use of Ativan due to negative effects of long term use on cognition.             -antipsychotic agents: N/A 5. Neuropsych: This patient is not capable of making  decisions on her own behalf. 6. Skin/Wound Care: Routine pressure relief measures. D/c IV after magnesium supplementation as per patient request 7. Fluids/Electrolytes/Nutrition: Monitor I/O.  Continue IV fluids at night.  Plan to discontinue in a.m. and check bmet on Monday 8. HTN: Monitor BP tid--currently on Lisinopril.Marland Kitchen --BP lower but no dizziness  -stop lisinopril and hold off on tizanidine as well  -encouraging fluids, HS IVF  -TEDS Vitals:   03/19/21 2007 03/20/21 0456  BP: (!) 101/38 (!) 104/55  Pulse: 77 68  Resp: 18 17  Temp: 97.9 F (36.6 C) 98.2 F (36.8  C)  SpO2: 100% 100%   BP improving on IVF 7/14  Off BP meds 9. Seizure prophylaxis: As per discharge summary, neurology recommended discontinuing Keppra in case it was contributing to hallucinations, but states that hallucinations were present prior to admission and patient has been tremulous/twitchy raising some concern for seizure. 6/19 to 6/20 showed no evidence of seizures. Consider discontinuing 6/25 one week after ICH if remains seizure free. d/c'ed 6/27 10. Intermittent fevers: Being monitored and have resolved without treatment. 11. Anemia of chronic illness?: Continue to monitor H/H with serial checks--around 9 since 12/2020.expect some drop with IVF fluids due to hemodilution  12. Hypomagnesemia: Resume home dose of supplement. IV supplementation on 6/25 13. Hypokalemia: Will add daily supplement.just added KCL  14. 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  Freq BM reduced senna S to 1 po qhs 16.   HA : likely vascular and tension/muscular -continue topamax 75mg  BID -off tizanidine d/t hypotension  -oxycodone as needed -added lidocaine patches for neck/upper shoulders 17.  Pre renal azotemia. Continue IVF at [facility], encourage fluids -some improvement in 7/7 labs -recheck BMET 7/11 much improved, continue IV  fluids x1 more night then stop 18. Left sided sciatica: start gabapentin 100mg  TID 19. Insomnia: consider melatonin if sleep does not improve with gabapentin.  20. Coccyx pain after fall: XR obtained and stable.   LOS: 21 days A FACE TO FACE EVALUATION WAS PERFORMED  Charlett Blake 03/20/2021, 9:44 AM

## 2021-03-20 NOTE — Progress Notes (Addendum)
Patient ID: Tonya [name], female   DOB: 12/26/1945, 75 y.o.   MRN: [id number]  Patient [facility] orders sent to [facility] [place], [place]

## 2021-03-20 NOTE — Progress Notes (Signed)
Patient ID: Tonya [name], female   DOB: 10-20-1945, 75 y.o.   MRN: [id number]  Vassie Moselle ordered through [employer], [place]

## 2021-03-21 NOTE — Plan of Care (Signed)
  Problem: Sit to Stand Goal: LTG:  Patient will perform sit to stand in prep for activites of daily living with assistance level (OT) Description: LTG:  Patient will perform sit to stand in prep for activites of daily living with assistance level (OT) Flowsheets (Taken 03/21/2021 0756) LTG: PT will perform sit to stand in prep for activites of daily living with assistance level: (goal upgraded based on progress) Contact Guard/Touching assist   Problem: RH Dressing Goal: LTG Patient will perform lower body dressing w/assist (OT) Description: LTG: Patient will perform lower body dressing with assist, with/without cues in positioning using equipment (OT) Flowsheets (Taken 03/21/2021 0756) LTG: Pt will perform lower body dressing with assistance level of: (goal upgraded based on progress) Minimal Assistance - Patient > 75%   Problem: RH Functional Use of Upper Extremity Goal: LTG Patient will use RT/LT upper extremity as a (OT) Description: LTG: Patient will use right/left upper extremity as a stabilizer/gross assist/diminished/nondominant/dominant level with assist, with/without cues during functional activity (OT) Flowsheets (Taken 03/21/2021 0759) LTG: Use of upper extremity in functional activities: LUE as diminished level LTG: Pt will use upper extremity in functional activity with assistance level of: Supervision/Verbal cueing   Problem: RH Memory Goal: LTG Patient will demonstrate ability for day to day recall/carry over during activities of daily living with assistance level (OT) Description: LTG:  Patient will demonstrate ability for day to day recall/carry over during activities of daily living with assistance level (OT). Flowsheets (Taken 03/21/2021 0759) LTG:  Patient will demonstrate ability for day to day recall/carry over during activities of daily living with assistance level (OT): Moderate Assistance - Patient 50 - 74%

## 2021-03-21 NOTE — Progress Notes (Signed)
Speech Language Pathology Weekly Progress and Session Note  Patient Details  Name: Tonya [name] MRN: [id number] Date of Birth: 03/26/1946  Beginning of progress report period: March 14, 2021 End of progress report period: March 21, 2021  Today's Date: 03/21/2021 SLP Individual Time: 1115-1200 SLP Individual Time Calculation (min): 45 min  Short Term Goals: Week 3: SLP Short Term Goal 1 (Week 3): Patient will recall and describe specific therapeutic interventions with PT/OT/SLP with min-to-mod A cues. SLP Short Term Goal 1 - Progress (Week 3): Met SLP Short Term Goal 2 (Week 3): Patient will demonstrate adequate reasoning and problem solving when completed simulated functional tasks with modA cues. SLP Short Term Goal 2 - Progress (Week 3): Met SLP Short Term Goal 3 (Week 3): Patient will demonstrate consistent emergent awareness to deficits and limitations, with min-to-modA -cues. SLP Short Term Goal 3 - Progress (Week 3): Met SLP Short Term Goal 4 (Week 3): Patient will perform basic-mild complex organizing and/or sequencing tasks with min-to-mod A cues. SLP Short Term Goal 4 - Progress (Week 3): Met  New Short Term Goals: Week 4: SLP Short Term Goal 1 (Week 4): STG=LTG due to ELOS  Weekly Progress Updates: Patient has met 4 of 4 short term goals. Patient has made excellent progress with ST over the past week. She is currently completing cognitive-linguistic tasks with min A verbal and visual cues. She is more consistently oriented, demonstrating significantly improved processing time, increased sustained attention, improved awareness of her deficits, emerging awareness, and more consistent carry over and recall from session-to-session. She continues to benefit from additional processing time for mildly-to-moderately complex problem solving/reasoning tasks, and left visual scanning. Family has been present during multiple education sessions this week and demonstrated understanding  of patient's cognitive-linguistic needs and limitations. Recommend continued  skilled ST intervention to maximize cognitive-linguistic function and functional independence prior to discharge. Continue progression toward established LTGs set at min assist level overall.   Intensity: Minumum of 1-2 x/day, 30 to 90 minutes Frequency: 3 to 5 out of 7 days Duration/Length of Stay: 7/19 Treatment/Interventions: Cognitive remediation/compensation;Dysphagia/aspiration precaution training;Internal/external aids;Medication managment;Environmental controls;Cueing hierarchy;Patient/family education;Functional tasks  Daily Session  Skilled Therapeutic Interventions: Patient agreeable to skilled ST intervention with focus on cognitive goals. Patient was able to successfully recall safety strategies and techniques learned during PT/OT treatment pertaining to wheelchair/bed/chair transfers, using walker, wheelchair, and general safety within her environment at sup A verbal cues. Patient demonstrates progressively improving emergent awareness and anticipation of needs and modifications within current setting and upon discharge to home. Patient responded to safety scenario questions with sup A verbal cues. SLP facilitated verbal reasoning and sustained attention task involving generating words within multiple categories based on initial letter. Patient completed task with sup-to-min A verbal cues and additional processing time. Patient demonstrated effective alternating attention between categories and letters and was able to sustain attention with minimal re-direction for duration of 45 minute session. Patient was left in recliner with needs within reach at end of session. Nurse arrived to deliver patient's lunch. Continue per plan of care.    General    Pain Pain Assessment Pain Scale: 0-10 Pain Score: 7  Faces Pain Scale: No hurt Pain Type: Acute pain Pain Location: Back Pain Orientation: Left;Right;Upper Pain  Radiating Towards: shoulders  Therapy/Group: Individual Therapy  [name] 03/21/2021, 12:39 PM

## 2021-03-21 NOTE — Progress Notes (Signed)
Occupational Therapy Weekly Progress Note  Patient Details  Name: Tonya [name] MRN: [id number] Date of Birth: 09-03-1946  Beginning of progress report period: March 15, 2021 End of progress report period: March 21, 2021  Today's Date: 03/21/2021 OT Individual Time: 3267-1245 OT Individual Time Calculation (min): 74 min    Patient has met 3 of 3 short term goals.  Tonya [name] has made excellent progress with OT over the past week.  She currently is completing UB bathing at supervision level with UB dressing at min assist and mod demonstrational cueing for orientation of clothing and sequencing the LUE first.  She is able to complete LB bathing in sitting with lateral leans at min guard assist with sit to stand at min assist as well.  LB dressing is at a min to mod assist level as well.  Transfers to the walk-in shower and tub bench are currently at min assist overall with use of the RW and min to mod without.  Left visual field deficit is still present with mod instructional cueing still needed to scan left of midline for locating all parts of her clothing and items on the sink.  LUE functional use has increased to a diminished level overall with completion of ADLs.  She is even able to tie her shoes with increased time.  Her family has been in for multiple education sessions this week and this will continue with expected discharge on 7/19.  Recommend continued CIR level therapy to continue progression toward established LTGs set at min assist overall.    Patient continues to demonstrate the following deficits: muscle weakness and muscle paralysis, impaired timing and sequencing, unbalanced muscle activation, motor apraxia, decreased coordination, and decreased motor planning, decreased visual perceptual skills and field cut, decreased attention to left, decreased initiation, decreased attention, decreased awareness, decreased problem solving, and delayed processing, and decreased  standing balance, hemiplegia, and decreased balance strategies and therefore will continue to benefit from skilled OT intervention to enhance overall performance with BADL and Reduce care partner burden.  Patient progressing toward long term goals..  Continue plan of care.  OT Short Term Goals Week 4:  OT Short Term Goal 1 (Week 4): Continue working on established LTGs set at [employer] assist level.  Skilled Therapeutic Interventions/Progress Updates:    Session 1:  (8099-8338) Pt in bed to start session with spouse present as well.  She was able to transfer from supine to sitting with supervision and then worked on donning her shoes.  She was able to donn both with increased time and supervision, including tying them.  She then completed stand pivot transfer to the wheelchair at min assist level and was pushed down to the ortho gym.  While being pushed she was asked to point out objects in the hallway that were red.  She did a much better job of locating objects on the left side with only min instructional cueing.  Began with work using the [employer] in sitting for visual scanning and for work on [employer] for field cut.  She was able to complete diagram copy with only 69% accuracy using the LUE.  She also completed visual scanning task in sitting and in standing.  In sitting, she was able to perform with 69% accuracy with an average time of 3.7 seconds.  With standing, she needed mod assist overall for balance secondary to left and posterior lean, but completed them with 67% accuracy in 3.9 seconds.  Transitioned to use of the UE  ergonometer for 3 sets of 3 mins and resistance placed on level 8.  She was able to maintain hand placement on the grips on the left side without having to re-grip for during the set.  RPMs maintained at 17-21 throughout.  Finished session with return to the room and pt staying up in the wheelchair in preparation for next session with SLP.  Safety belt in place with call  button and phone in reach.    Session 2: (5217-4715)  Pt sitting up in the wheelchair during session.  Educated pt and spouse on completion of FM coordination activities to be completed at home with the LUE.  Reviewed each exercise and provided handout as reference.  Next, took pt down to the dayroom via wheelchair where she worked on LUE coordination flipping over cards and working on [employer].  She was able to flip the cards one at a time, but needed use of both hands for straightening them up.  When stacking the blocks, she demonstrated decreased visual processing as the first two rows were stacked correctly with the blocks in the correct orientation.  On the third row, she turned the blocks up sideways.  When asked to identify what was wrong, she was unable to identify it.  Finished session with return to the room and transfer to the bed with min assist stand pivot.  She continues to too far forward at the top of the bed, not being aware of where she needs to sit on the left side.  Once in sitting she was able to remove her shoes and then transition to supine with setup.  Pt left with spouse present and with the call button and phone in reach.    Therapy Documentation Precautions:  Precautions Precautions: Fall Precaution Comments: L hemipareisis, L inattention, mild pushing to L, L field of vision cut Restrictions Weight Bearing Restrictions: No  Pain: Pain Assessment Pain Scale: 0-10 Pain Score: 7  Faces Pain Scale: No hurt Pain Type: Acute pain Pain Location: Back Pain Orientation: Left;Right;Upper Pain Radiating Towards: shoulders ADL: See Care Tool Section for some details of mobility and selfcare  Therapy/Group: Individual Therapy  Chai Routh OTR/L 03/21/2021, 12:39 PM

## 2021-03-21 NOTE — Progress Notes (Signed)
Physical Therapy Session Note  Patient Details  Name: Tonya [name] MRN: [id number] Date of Birth: [birth year]  Today's Date: 03/21/2021 PT Individual Time: 0806-0900 PT Individual Time Calculation (min): 54 min   Short Term Goals: Week 3:  PT Short Term Goal 1 (Week 3): Pt will perform supine<>sit with min assist PT Short Term Goal 1 - Progress (Week 3): Met PT Short Term Goal 2 (Week 3): Pt will perform sit<>stands using LRAD with min assist PT Short Term Goal 2 - Progress (Week 3): Met PT Short Term Goal 3 (Week 3): Pt will perform bed<>chair transfers using LRAD with min asist PT Short Term Goal 3 - Progress (Week 3): Met PT Short Term Goal 4 (Week 3): Pt will ambulate at least 37ft using LRAD with mod assist of 1 PT Short Term Goal 4 - Progress (Week 3): Met Week 4:  PT Short Term Goal 1 (Week 4): = to LTGs based on ELOS  Skilled Therapeutic Interventions/Progress Updates:   Pt received supine in bed and agreeable to PT. Supine>sit transfer with supervision  assist and cues for decreased use of bed rail as tolerated.   Ambulatory transfer to [facility] with RW and CGA from PT as well as cues for visual scanning to the R to avoid sink. Pt transported to rehab gym in [facility].   Gait training with RW x 158ft with min-CGA from PT for safety and control of RW in turns, noted to have improved step width and midline orientation throughout gait training on this session compared to previous sessions.  Husband present for education. Gait training with husband x 31ft with min assist and instruction to improve positioning while guarding and prevent LOB in turns to the L.  Dynamic gait training to weave through 8 cones with min assist and min-mod multimodal instruction to prevent LOB to the L, improved step width , visual scanning to the L, and AD control in turns to the R.   Step management with RW to simulate home access with husband x 1 on 6 inch step. Moderate cues for sequencing and AD  management and min assist provided by husband. No LOB throughout step management.   Car transfer training with min assist provided by husband. Moderate cues for improved positioning to prevent LOB as well as improve safety and AD management with stand pivot technique to access car.   Pt returned to room and performed stand pivot transfer to bed with RW and supervision assist with cues for AD managent. Sit>supine completed with min assist on the LLE due to fatigue, and left supine in bed with call bell in reach and all needs met.        Therapy Documentation Precautions:  Precautions Precautions: Fall Precaution Comments: L hemipareisis, L inattention, mild pushing to L, L field of vision cut Restrictions Weight Bearing Restrictions: No  Vital Signs: Therapy Vitals Temp: 97.9 F (36.6 C) Temp Source: Oral Pulse Rate: 72 Resp: 20 BP: 104/62 Patient Position (if appropriate): Lying Oxygen Therapy SpO2: 99 % O2 Device: Room Air Pain: Pain Assessment Pain Scale: 0-10 Pain Score: 0-No pain Faces Pain Scale: No hurt     Therapy/Group: Individual Therapy  Lorie Phenix 03/21/2021, 9:22 AM

## 2021-03-21 NOTE — Progress Notes (Signed)
PROGRESS NOTE   Subjective/Complaints: Patient feels well, we discussed driving with patient and her husband, she will be restricted for months and that we will be addressing this in the outpatient clinic  ROS: Patient denies CP, SOB, N/V/D   Objective:   No results found. No results for input(s): WBC, HGB, HCT, PLT in the last 72 hours.   No results for input(s): NA, K, CL, CO2, GLUCOSE, BUN, CREATININE, CALCIUM in the last 72 hours.     Intake/Output Summary (Last 24 hours) at 03/21/2021 1353 Last data filed at 03/20/2021 1858 Gross per 24 hour  Intake 60 ml  Output --  Net 60 ml         Physical Exam: Vital Signs Blood pressure 104/62, pulse 72, temperature 97.9 F (36.6 C), temperature source Oral, resp. rate 20, height 5\' 4"  (1.626 m), weight 56.4 kg, SpO2 99 %.  General: No acute distress Mood and affect are appropriate Heart: Regular rate and rhythm no rubs murmurs or extra sounds Lungs: Clear to auscultation, breathing unlabored, no rales or wheezes Abdomen: Positive bowel sounds, soft nontender to palpation, nondistended Extremities: No clubbing, cyanosis, or edema Skin: No evidence of breakdown, no evidence of rash  Neurologic: alert. Follows basic commands.  motor strength is 5/5 in right upper and lower limb.  3-/5 left upper: deltoid, bicep, tricep, grip. LLE: 3 - hip flexor, knee extensors, ankle dorsiflexor and plantar flexor, 3/5 ankle inversion eversion on the right side. Sensory exam normal sensation to light touch RIght and absent left  upper and lower extremities Musculoskeletal: Full range PROM in all 4 extremities.  Has pain with palpation and tightness in upper traps, neck, occiput     Assessment/Plan: 1. Functional deficits which require 3+ hours per day of interdisciplinary therapy in a comprehensive inpatient rehab setting. Physiatrist is providing close team supervision and 24 hour  management of active medical problems listed below. Physiatrist and rehab team continue to assess barriers to discharge/monitor patient progress toward functional and medical goals  Care Tool:  Bathing    Body parts bathed by patient: Left arm, Chest, Abdomen, Right upper leg, Left upper leg, Face, Right arm, Front perineal area, Buttocks, Right lower leg, Left lower leg   Body parts bathed by helper: Right lower leg, Left lower leg, Right arm, Front perineal area, Buttocks Body parts n/a: Right upper leg, Left upper leg, Buttocks, Front perineal area, Left lower leg, Right lower leg (did not attempt)   Bathing assist Assist Level: Minimal Assistance - Patient > 75%     Upper Body Dressing/Undressing Upper body dressing   What is the patient wearing?: Pull over shirt    Upper body assist Assist Level: Minimal Assistance - Patient > 75%    Lower Body Dressing/Undressing Lower body dressing      What is the patient wearing?: Pants, Incontinence brief     Lower body assist Assist for lower body dressing: Moderate Assistance - Patient 50 - 74%     Toileting Toileting    Toileting assist Assist for toileting: Minimal Assistance - Patient > 75%     Transfers Chair/bed transfer  Transfers assist  Chair/bed transfer activity did not  occur: Safety/medical concerns  Chair/bed transfer assist level: Minimal Assistance - Patient > 75% Chair/bed transfer assistive device: Armrests, Programmer, multimedia   Ambulation assist   Ambulation activity did not occur: Refused  Assist level: Contact Guard/Touching assist Assistive device: Walker-rolling Max distance: 60ft   Walk 10 feet activity   Assist  Walk 10 feet activity did not occur: Refused  Assist level: Contact Guard/Touching assist Assistive device: Walker-rolling   Walk 50 feet activity   Assist Walk 50 feet with 2 turns activity did not occur: Safety/medical concerns  Assist level: Minimal  Assistance - Patient > 75% Assistive device: Walker-rolling    Walk 150 feet activity   Assist Walk 150 feet activity did not occur: Safety/medical concerns         Walk 10 feet on uneven surface  activity   Assist Walk 10 feet on uneven surfaces activity did not occur: Safety/medical concerns         Wheelchair     Assist Will patient use wheelchair at discharge?: Yes Type of Wheelchair: Manual      Max wheelchair distance: 20 ft    Wheelchair 50 feet with 2 turns activity    Assist        Assist Level: Maximal Assistance - Patient 25 - 49%   Wheelchair 150 feet activity     Assist      Assist Level: Dependent - Patient 0%, Total Assistance - Patient < 25%   Blood pressure 104/62, pulse 72, temperature 97.9 F (36.6 C), temperature source Oral, resp. rate 20, height 5\' 4"  (1.626 m), weight 56.4 kg, SpO2 99 %.    Medical Problem List and Plan: 1. 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  Antithrombotics: -DVT/anticoagulation:  Mechanical: Sequential compression devices, below knee Bilateral lower extremities             -antiplatelet therapy: N/A 3. Headaches/Pain Management: Oxycodone prn for HA. Recommended minimizing soft drinks and added sugar in diet and she is agreeable. Provided advice regarding drinking coffee at regular time every morning and eliminating other sources of caffeine during the day.              --see below 4. Anxiety: LCSW to follow for evaluation and support. Recommended to continue to minimize use of Ativan due to negative effects of long term use on cognition.             -antipsychotic agents: N/A 5. Neuropsych: This patient is not capable of making decisions on her own behalf. 6.  Skin/Wound Care: Routine pressure relief measures. D/c IV after magnesium supplementation as per patient request 7. Fluids/Electrolytes/Nutrition: Monitor I/O.  Continue IV fluids at night.  Plan to discontinue in a.m. and check bmet on Monday 8. HTN: Monitor BP tid--currently on Lisinopril.Marland Kitchen --BP lower but no dizziness  -stop lisinopril and hold off on tizanidine as well  -encouraging fluids, HS IVF  -TEDS Vitals:   03/20/21 2017 03/21/21 0522  BP: (!) 101/56 104/62  Pulse: 77 72  Resp: 20 20  Temp: 98.2 F (36.8 C) 97.9 F (36.6 C)  SpO2: 97% 99%   BP improving on IVF 7/15  Off BP meds 9. Seizure prophylaxis: As per discharge summary, neurology recommended discontinuing Keppra in case it was contributing to hallucinations, but states that hallucinations were present prior to admission and patient has been tremulous/twitchy raising some concern for seizure. 6/19 to 6/20 showed no evidence of seizures. Consider discontinuing 6/25 one week after ICH if remains seizure free. d/c'ed 6/27 10. Intermittent fevers: Being monitored and have resolved without treatment. 11. Anemia of chronic illness?: Continue to monitor H/H with serial checks--around 9 since 12/2020.expect some drop with IVF fluids due to hemodilution  12. Hypomagnesemia: Resume home dose of supplement. IV supplementation on 6/25 13. Hypokalemia: Will add daily supplement.just added KCL  14. 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  Freq BM reduced senna S to 1 po qhs 16.   HA : likely vascular and tension/muscular -continue topamax 75mg  BID -off tizanidine d/t hypotension  -oxycodone as needed -added lidocaine patches for neck/upper shoulders 17.  Pre renal azotemia. Continue IVF at [facility], encourage fluids -some improvement in 7/7 labs -recheck BMET 7/11 much improved, D/C IVF , recheck BMET on Monday 7/18 18. Left sided  sciatica: start gabapentin 100mg  TID 19. Insomnia: consider melatonin if sleep does not improve with gabapentin.  20. Coccyx pain after fall: XR obtained and stable.   LOS: 22 days A FACE TO FACE EVALUATION WAS PERFORMED  Charlett Blake 03/21/2021, 1:53 PM

## 2021-03-22 DIAGNOSIS — M5410 Radiculopathy, site unspecified: Secondary | ICD-10-CM

## 2021-03-22 NOTE — Progress Notes (Signed)
Physical Therapy Session Note  Patient Details  Name: Tonya [name] MRN: [id number] Date of Birth: [birth year]  Today's Date: 03/22/2021 PT Individual Time: 1657-1740 PT Individual Time Calculation (min): 43 min   Short Term Goals: Week 4:  PT Short Term Goal 1 (Week 4): = to LTGs based on ELOS  Skilled Therapeutic Interventions/Progress Updates:    Pt received supine in bed with her daughter present and pt agreeable to therapy session. Therapy session focused on hands-on education/training with pt/daughter. Therapist educated pt/daughter on how to manage parts of the rental w/c including brakes, flip-back armrests, taking off and putting back on the w/c back and seat cushion, folding up and unfolding wheelchair, donning/doffing leg rests - pt's daughter demonstrated proper ability to manage all of these throughout session. Supine>sitting L EOB, HOB flat, with supervision. Sitting EOB donned tennis shoes max assist for time management. Reinforced education on proper use of gait belt. Pt's daughter set-up w/c for transfer with min cuing from therapist - pt performed R squat pivot (self selected technique) with CGA for steadying form pt's daughter after min cuing from thearpist on technique. Pt performed B UE w/c propulsion ~36ft in room with therapist providing hand-over-hand education on how to turn in tight spaces and back up - pt demonstrating understanding. Pt ambulated ~63ft with daughter providing proper hands-on CGA with therapist providing intermittent cuing for proper positioning - pt demos good AD management and good L LE gait mechanics to clear foot and reciprocal stepping pattern. Pt reports she is feeling much more confident with her mobility with decreased fear of falling. Pt/family report no further questions at this time. Sit>supine with cuing for L LE management. Pt left supine in bed with needs in reach, bed alarm on, and daughter present.   Therapy  Documentation Precautions:  Precautions Precautions: Fall Precaution Comments: L hemipareisis, L inattention, mild pushing to L, L field of vision cut Restrictions Weight Bearing Restrictions: No   Pain: No reports of pain throughout session.    Therapy/Group: Individual Therapy  Tawana Scale , PT, DPT, NCS, CSRS 03/22/2021, 4:55 PM

## 2021-03-22 NOTE — Progress Notes (Signed)
PROGRESS NOTE   Subjective/Complaints:  Pt reports little tired- also pain in buttocks and back- fell 2 weeks ago- it's why she's hurting Patches not real helpful- Ate <25% of breakfast.  LBM yesterday and daily.      ROS:  Pt denies SOB, abd pain, CP, N/V/C/D,    Objective:   No results found. No results for input(s): WBC, HGB, HCT, PLT in the last 72 hours.   No results for input(s): NA, K, CL, CO2, GLUCOSE, BUN, CREATININE, CALCIUM in the last 72 hours.     Intake/Output Summary (Last 24 hours) at 03/22/2021 1809 Last data filed at 03/22/2021 1246 Gross per 24 hour  Intake 400 ml  Output --  Net 400 ml        Physical Exam: Vital Signs Blood pressure 104/62, pulse 64, temperature 98.2 F (36.8 C), temperature source Oral, resp. rate 17, height 5\' 4"  (1.626 m), weight 56.4 kg, SpO2 100 %.   General: awake, alert, appropriate, sitting up in bed;  NAD HENT: conjugate gaze; oropharynx moist CV: regular rate; no JVD Pulmonary: CTA B/L; no W/R/R- good air movement GI: soft, NT, ND, (+)BS- normoactive Psychiatric: appropriate but perseverating on pain Neurological: alert  Neurologic: alert. Follows basic commands.  motor strength is 5/5 in right upper and lower limb.  3-/5 left upper: deltoid, bicep, tricep, grip. LLE: 3 - hip flexor, knee extensors, ankle dorsiflexor and plantar flexor, 3/5 ankle inversion eversion on the right side. Sensory exam normal sensation to light touch RIght and absent left  upper and lower extremities Musculoskeletal: Full range PROM in all 4 extremities.  Has pain with palpation and tightness in upper traps, neck, occiput as well over Low back in band and buttocks- piriformis B/L     Assessment/Plan: 1. Functional deficits which require 3+ hours per day of interdisciplinary therapy in a comprehensive inpatient rehab setting. Physiatrist is providing close team supervision and 24  hour management of active medical problems listed below. Physiatrist and rehab team continue to assess barriers to discharge/monitor patient progress toward functional and medical goals  Care Tool:  Bathing    Body parts bathed by patient: Left arm, Chest, Abdomen, Right upper leg, Left upper leg, Face, Right arm, Front perineal area, Buttocks, Right lower leg, Left lower leg   Body parts bathed by helper: Right lower leg, Left lower leg, Right arm, Front perineal area, Buttocks Body parts n/a: Right upper leg, Left upper leg, Buttocks, Front perineal area, Left lower leg, Right lower leg (did not attempt)   Bathing assist Assist Level: Minimal Assistance - Patient > 75%     Upper Body Dressing/Undressing Upper body dressing   What is the patient wearing?: Pull over shirt    Upper body assist Assist Level: Minimal Assistance - Patient > 75%    Lower Body Dressing/Undressing Lower body dressing      What is the patient wearing?: Pants, Incontinence brief     Lower body assist Assist for lower body dressing: Moderate Assistance - Patient 50 - 74%     Toileting Toileting    Toileting assist Assist for toileting: Minimal Assistance - Patient > 75%     Transfers  Chair/bed transfer  Transfers assist  Chair/bed transfer activity did not occur: Safety/medical concerns  Chair/bed transfer assist level: Minimal Assistance - Patient > 75% Chair/bed transfer assistive device: Armrests, Programmer, multimedia   Ambulation assist   Ambulation activity did not occur: Refused  Assist level: Contact Guard/Touching assist Assistive device: Walker-rolling Max distance: 63ft   Walk 10 feet activity   Assist  Walk 10 feet activity did not occur: Refused  Assist level: Contact Guard/Touching assist Assistive device: Walker-rolling   Walk 50 feet activity   Assist Walk 50 feet with 2 turns activity did not occur: Safety/medical concerns  Assist level: Minimal  Assistance - Patient > 75% Assistive device: Walker-rolling    Walk 150 feet activity   Assist Walk 150 feet activity did not occur: Safety/medical concerns         Walk 10 feet on uneven surface  activity   Assist Walk 10 feet on uneven surfaces activity did not occur: Safety/medical concerns         Wheelchair     Assist Will patient use wheelchair at discharge?: Yes Type of Wheelchair: Manual      Max wheelchair distance: 20 ft    Wheelchair 50 feet with 2 turns activity    Assist        Assist Level: Maximal Assistance - Patient 25 - 49%   Wheelchair 150 feet activity     Assist      Assist Level: Dependent - Patient 0%, Total Assistance - Patient < 25%   Blood pressure 104/62, pulse 64, temperature 98.2 F (36.8 C), temperature source Oral, resp. rate 17, height 5\' 4"  (1.626 m), weight 56.4 kg, SpO2 100 %.    Medical Problem List and Plan: 1. 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  Antithrombotics: -DVT/anticoagulation:  Mechanical: Sequential compression devices, below knee Bilateral lower extremities             -antiplatelet therapy: N/A 3. Headaches/Pain Management: Oxycodone prn for HA. Recommended minimizing soft drinks and added sugar in diet and she is agreeable. Provided advice regarding drinking coffee at regular time every morning and eliminating other sources of caffeine during the day.   7/16- says still having pain in back and buttocks- con't regimen and patches- pt not sure they help, but certainly cannot hurt.             --see below 4. Anxiety: LCSW to follow for evaluation and support. Recommended to continue to minimize use of Ativan due to negative effects of long term use on cognition.             -antipsychotic  agents: N/A 5. Neuropsych: This patient is not capable of making decisions on her own behalf. 6. Skin/Wound Care: Routine pressure relief measures. D/c IV after magnesium supplementation as per patient request 7. Fluids/Electrolytes/Nutrition: Monitor I/O.  Continue IV fluids at night.  Plan to discontinue in a.m. and check bmet on Monday 8. HTN: Monitor BP tid--currently on Lisinopril.Marland Kitchen --BP lower but no dizziness  -stop lisinopril and hold off on tizanidine as well  -encouraging fluids, HS IVF  -TEDS Vitals:  03/22/21 0806 03/22/21 1314  BP:  104/62  Pulse:  64  Resp:  17  Temp:  98.2 F (36.8 C)  SpO2: 95% 100%   BP improving on IVF 7/15  Off BP meds  7/16- BP controlled/a little soft- no dizziness- con't regimen 9. Seizure prophylaxis: As per discharge summary, neurology recommended discontinuing Keppra in case it was contributing to hallucinations, but states that hallucinations were present prior to admission and patient has been tremulous/twitchy raising some concern for seizure. 6/19 to 6/20 showed no evidence of seizures. Consider discontinuing 6/25 one week after ICH if remains seizure free. d/c'ed 6/27 10. Intermittent fevers: Being monitored and have resolved without treatment. 11. Anemia of chronic illness?: Continue to monitor H/H with serial checks--around 9 since 12/2020.expect some drop with IVF fluids due to hemodilution  12. Hypomagnesemia: Resume home dose of supplement. IV supplementation on 6/25 13. Hypokalemia: Will add daily supplement.just added KCL  14. 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  Freq BM reduced senna S to 1 po qhs 16.   HA : likely vascular and tension/muscular -continue topamax 75mg  BID -off tizanidine d/t hypotension  -oxycodone as needed -added lidocaine patches for neck/upper shoulders 17.  Pre renal azotemia. Continue IVF at [facility],  encourage fluids -some improvement in 7/7 labs -recheck BMET 7/11 much improved, D/C IVF , recheck BMET on Monday 7/18 7/16- d/c'd IVFs- will recheck Monday 18. Left sided sciatica: start gabapentin 100mg  TID 19. Insomnia: consider melatonin if sleep does not improve with gabapentin.   7/16- a little sleepy, but said slept OK- con't to monitor 20. Coccyx pain after fall: XR obtained and stable.   7/16- having some pain-   LOS: 23 days A FACE TO FACE EVALUATION WAS PERFORMED  Nikkita Adeyemi 03/22/2021, 6:09 PM

## 2021-03-23 MED ORDER — GABAPENTIN 300 MG PO CAPS
300.0000 mg | ORAL_CAPSULE | Freq: Three times a day (TID) | ORAL | Status: DC
  Administered 2021-03-23 – 2021-03-25 (×6): 300 mg via ORAL
  Filled 2021-03-23 (×6): qty 1

## 2021-03-23 NOTE — Progress Notes (Signed)
PROGRESS NOTE   Subjective/Complaints:  Pt reports pain has "gone up" in R hip, thigh and low back/buttock- is throbbing- also has associated nerve pain.  Taking the pain med-s lidoderm helps slightly- takes the edge off, but nothing else.  Up to 7/10 from 6/10.     ROS:   Pt denies SOB, abd pain, CP, N/V/C/D,   Objective:   No results found. No results for input(s): WBC, HGB, HCT, PLT in the last 72 hours.   No results for input(s): NA, K, CL, CO2, GLUCOSE, BUN, CREATININE, CALCIUM in the last 72 hours.     Intake/Output Summary (Last 24 hours) at 03/23/2021 1137 Last data filed at 03/23/2021 0900 Gross per 24 hour  Intake 520 ml  Output --  Net 520 ml        Physical Exam: Vital Signs Blood pressure 116/70, pulse 75, temperature 97.9 F (36.6 C), temperature source Oral, resp. rate 17, height 5\' 4"  (1.626 m), weight 56.4 kg, SpO2 98 %.   General: awake, alert, appropriate, sitting on the toilet, having BM; NAD HENT: conjugate gaze; oropharynx moist CV: regular rate; no JVD Pulmonary: CTA B/L; no W/R/R- good air movement GI: soft, NT, ND, (+)BS Psychiatric: appropriate; very focused on pain issues Neurological: Ox3  Neurologic: alert. Follows basic commands.  motor strength is 5/5 in right upper and lower limb.  3-/5 left upper: deltoid, bicep, tricep, grip. LLE: 3 - hip flexor, knee extensors, ankle dorsiflexor and plantar flexor, 3/5 ankle inversion eversion on the right side. Sensory exam normal sensation to light touch RIght and absent left  upper and lower extremities Musculoskeletal: Full range PROM in all 4 extremities.  Has pain with palpation and tightness in upper traps, neck, occiput as well over Low back in band and buttocks and R anterior thigh- piriformis on R side- sciatica Sx's    Assessment/Plan: 1. Functional deficits which require 3+ hours per day of interdisciplinary therapy in a  comprehensive inpatient rehab setting. Physiatrist is providing close team supervision and 24 hour management of active medical problems listed below. Physiatrist and rehab team continue to assess barriers to discharge/monitor patient progress toward functional and medical goals  Care Tool:  Bathing    Body parts bathed by patient: Left arm, Chest, Abdomen, Right upper leg, Left upper leg, Face, Right arm, Front perineal area, Buttocks, Right lower leg, Left lower leg   Body parts bathed by helper: Right lower leg, Left lower leg, Right arm, Front perineal area, Buttocks Body parts n/a: Right upper leg, Left upper leg, Buttocks, Front perineal area, Left lower leg, Right lower leg (did not attempt)   Bathing assist Assist Level: Minimal Assistance - Patient > 75%     Upper Body Dressing/Undressing Upper body dressing   What is the patient wearing?: Pull over shirt    Upper body assist Assist Level: Minimal Assistance - Patient > 75%    Lower Body Dressing/Undressing Lower body dressing      What is the patient wearing?: Pants, Incontinence brief     Lower body assist Assist for lower body dressing: Moderate Assistance - Patient 50 - 74%     Toileting Toileting  Toileting assist Assist for toileting: Minimal Assistance - Patient > 75%     Transfers Chair/bed transfer  Transfers assist  Chair/bed transfer activity did not occur: Safety/medical concerns  Chair/bed transfer assist level: Contact Guard/Touching assist Chair/bed transfer assistive device: Armrests, Programmer, multimedia   Ambulation assist   Ambulation activity did not occur: Refused  Assist level: Contact Guard/Touching assist Assistive device: Walker-rolling Max distance: 9ft   Walk 10 feet activity   Assist  Walk 10 feet activity did not occur: Refused  Assist level: Contact Guard/Touching assist Assistive device: Walker-rolling   Walk 50 feet activity   Assist Walk 50  feet with 2 turns activity did not occur: Safety/medical concerns  Assist level: Contact Guard/Touching assist Assistive device: Walker-rolling    Walk 150 feet activity   Assist Walk 150 feet activity did not occur: Safety/medical concerns         Walk 10 feet on uneven surface  activity   Assist Walk 10 feet on uneven surfaces activity did not occur: Safety/medical concerns         Wheelchair     Assist Will patient use wheelchair at discharge?: Yes Type of Wheelchair: Manual      Max wheelchair distance: 20 ft    Wheelchair 50 feet with 2 turns activity    Assist        Assist Level: Maximal Assistance - Patient 25 - 49%   Wheelchair 150 feet activity     Assist      Assist Level: Dependent - Patient 0%, Total Assistance - Patient < 25%   Blood pressure 116/70, pulse 75, temperature 97.9 F (36.6 C), temperature source Oral, resp. rate 17, height 5\' 4"  (1.626 m), weight 56.4 kg, SpO2 98 %.    Medical Problem List and Plan: 1. 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  Antithrombotics: -DVT/anticoagulation:  Mechanical: Sequential compression devices, below knee Bilateral lower extremities             -antiplatelet therapy: N/A 3. Headaches/Pain Management: Oxycodone prn for HA. Recommended minimizing soft drinks and added sugar in diet and she is agreeable. Provided advice regarding drinking coffee at regular time every morning and eliminating other sources of caffeine during the day.   7/16- says still having pain in back and buttocks- con't regimen and patches- pt not sure they help, but certainly cannot hurt.             --see below 4. Anxiety: LCSW to follow for evaluation and support. Recommended to continue to minimize use of  Ativan due to negative effects of long term use on cognition.             -antipsychotic agents: N/A 5. Neuropsych: This patient is not capable of making decisions on her own behalf. 6. Skin/Wound Care: Routine pressure relief measures. D/c IV after magnesium supplementation as per patient request 7. Fluids/Electrolytes/Nutrition: Monitor I/O.  Continue IV fluids at night.  Plan to discontinue in a.m. and check bmet on Monday 8. HTN: Monitor BP tid--currently on Lisinopril.Marland Kitchen --BP lower but no dizziness  -stop lisinopril and hold off on tizanidine as well  -  encouraging fluids, HS IVF  -TEDS Vitals:   03/23/21 0514 03/23/21 0756  BP: 116/70   Pulse: 74 75  Resp: 20 17  Temp: 97.9 F (36.6 C)   SpO2: 100% 98%   BP improving on IVF 7/15  Off BP meds  7/16- BP controlled/a little soft- no dizziness- con't regimen 9. Seizure prophylaxis: As per discharge summary, neurology recommended discontinuing Keppra in case it was contributing to hallucinations, but states that hallucinations were present prior to admission and patient has been tremulous/twitchy raising some concern for seizure. 6/19 to 6/20 showed no evidence of seizures. Consider discontinuing 6/25 one week after ICH if remains seizure free. d/c'ed 6/27 10. Intermittent fevers: Being monitored and have resolved without treatment. 11. Anemia of chronic illness?: Continue to monitor H/H with serial checks--around 9 since 12/2020.expect some drop with IVF fluids due to hemodilution  12. Hypomagnesemia: Resume home dose of supplement. IV supplementation on 6/25 13. Hypokalemia: Will add daily supplement.just added KCL  14. 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  Freq BM reduced senna S to 1 po qhs 16.   HA : likely vascular and tension/muscular -continue topamax 75mg  BID -off tizanidine d/t hypotension  -oxycodone as  needed -added lidocaine patches for neck/upper shoulders 17.  Pre renal azotemia. Continue IVF at [facility], encourage fluids -some improvement in 7/7 labs -recheck BMET 7/11 much improved, D/C IVF , recheck BMET on Monday 7/18 7/16- d/c'd IVFs- will recheck Monday 18. Left sided sciatica: start gabapentin 100mg  TID 19. Insomnia: consider melatonin if sleep does not improve with gabapentin.   7/16- a little sleepy, but said slept OK- con't to monitor 20. Coccyx pain after fall: XR obtained and stable.   7/16- having some pain-   7/17- Has percocet 1-2 tabs q4 hours- explained that Dr Raliegh Ip might possibly try Steroid taper to help get pain under control- since has increased so much- also increased her gabapentin to 30 mg TID to see if would help.   LOS: 24 days A FACE TO FACE EVALUATION WAS PERFORMED  Bowdy Bair 03/23/2021, 11:37 AM

## 2021-03-24 LAB — BASIC METABOLIC PANEL
Anion gap: 7 (ref 5–15)
BUN: 19 mg/dL (ref 8–23)
CO2: 20 mmol/L — ABNORMAL LOW (ref 22–32)
Calcium: 9.6 mg/dL (ref 8.9–10.3)
Chloride: 114 mmol/L — ABNORMAL HIGH (ref 98–111)
Creatinine, Ser: 0.81 mg/dL (ref 0.44–1.00)
GFR, Estimated: >60 mL/min (ref >60)
Glucose, Bld: 89 mg/dL (ref 70–99)
Potassium: 3.8 mmol/L (ref 3.5–5.1)
Sodium: 141 mmol/L (ref 135–145)

## 2021-03-24 LAB — CBC
HCT: 28.7 % — ABNORMAL LOW (ref 36.0–46.0)
Hemoglobin: 9.2 g/dL — ABNORMAL LOW (ref 12.0–15.0)
MCH: 33.7 pg (ref 26.0–34.0)
MCHC: 32.1 g/dL (ref 30.0–36.0)
MCV: 105.1 fL — ABNORMAL HIGH (ref 80.0–100.0)
Platelets: 235 10*3/uL (ref 150–400)
RBC: 2.73 MIL/uL — ABNORMAL LOW (ref 3.87–5.11)
RDW: 13.7 % (ref 11.5–15.5)
WBC: 4.1 10*3/uL (ref 4.0–10.5)
nRBC: 0 % (ref 0.0–0.2)

## 2021-03-24 NOTE — Progress Notes (Signed)
Occupational Therapy Discharge Summary  Patient Details  Name: Tonya [name] MRN: [id number] Date of Birth: 12/03/1945  Today's Date: 03/24/2021 OT Individual Time: 3845-3646 OT Individual Time Calculation (min): 43 min   Session Note:  Pt in bed to start with transition to sitting with supervision.  She then donned her socks and shoes with setup using the LUE at a diminished level.  She was able to complete donning of a pullover shirt as well with min assist and mod demonstrational cueing for orientation.  Initially, she had the shirt backwards and was unaware.  Completed stand pivot transfer to the wheelchair at in guard assist with no device and then worked on oral hygiene with supervision sitting to finish session.  She was left with the call button and phone in reach with safety belt in place.    Patient has met 11 of 11 long term goals due to improved activity tolerance, improved balance, functional use of  LEFT upper and LEFT lower extremity, improved attention, improved awareness, and improved coordination.  Patient to discharge at [facility] Assist level.  Patient's care partner is independent to provide the necessary physical and cognitive assistance at discharge.    Reasons goals not met: NA  Recommendation:  Patient will benefit from ongoing skilled OT services in home health setting to continue to advance functional skills in the area of BADL, iADL, and Reduce care partner burden.  Pt will continue to benefit from [facility] with transition to outpatient in order to increased overall independence with selfcare tasks to a modified independent level overall.  She still exhibits decreased selective attention as well as decreased motor planning, attention to the left, and decreased LUE strength and coordination.    Equipment: Tub bench, 3:1  Reasons for discharge: treatment goals met and discharge from hospital  Patient/family agrees with progress made and goals achieved:  Yes  OT Discharge Precautions/Restrictions  Precautions Precautions: Fall Precaution Comments: L hemipareisis, L inattention, L field of vision cut Restrictions Weight Bearing Restrictions: No  Pain Pain Assessment Pain Score: 0-No pain ADL ADL Eating: Set up Where Assessed-Eating: Wheelchair Grooming: Setup Where Assessed-Grooming: Wheelchair Upper Body Bathing: Supervision/safety Where Assessed-Upper Body Bathing: Chair, Multimedia programmer Lower Body Bathing: Minimal assistance Where Assessed-Lower Body Bathing: Shower Upper Body Dressing: Minimal assistance Where Assessed-Upper Body Dressing: Wheelchair Lower Body Dressing: Minimal assistance Where Assessed-Lower Body Dressing: Standing at sink, Sitting at sink Toileting: Minimal assistance Where Assessed-Toileting: Bedside Commode Toilet Transfer: Minimal assistance Toilet Transfer Method: Counselling psychologist: Engineer, technical sales Transfer: Minimal Museum/gallery conservator Method: Administrator, arts: Facilities manager: Minimal assistance Social research officer, government Method: Heritage manager: Radio broadcast assistant Vision Baseline Vision/History: Wears glasses Wears Glasses: At all times Patient Visual Report: No change from baseline Vision Assessment?: Yes Eye Alignment: Within Functional Limits Ocular Range of Motion: Restricted on the left Alignment/Gaze Preference: Head turned (head turn to the right) Tracking/Visual Pursuits: Able to track stimulus in all quads without difficulty Saccades: Within functional limits Convergence: Within functional limits Diplopia Assessment: Other (comment) (slight visual field impairment on the left noted when attempting to read, missing the first words on the left of the page.  continues to improve rapidly however.) Perception  Perception: Impaired Inattention/Neglect:  (left inattention) Praxis Praxis:  Impaired Praxis Impairment Details: Motor planning Cognition Overall Cognitive Status: Impaired/Different from baseline Arousal/Alertness: Awake/alert Orientation Level: Oriented X4 Attention: Sustained;Selective Sustained Attention: Appears intact Selective Attention: Impaired Memory: Impaired Memory Impairment: Decreased recall  of new information Immediate Memory Recall: Sock;Blue;Bed Problem Solving: Impaired Initiating: Impaired Safety/Judgment: Impaired Sensation Sensation Light Touch: Appears Intact Hot/Cold: Not tested Proprioception: Impaired Detail Proprioception Impaired Details: Impaired LUE Stereognosis: Appears Intact Additional Comments: Slight impaired proprioception in the left wrist and hand with testing. Coordination Gross Motor Movements are Fluid and Coordinated: No Fine Motor Movements are Fluid and Coordinated: No Coordination and Movement Description: Pt uses the LUE at a diminshed level for selfcare tasks currently.  Decreased speed noted compared to the non-involved RUE. Motor  Motor Motor: Hemiplegia;Motor apraxia Motor - Discharge Observations: Still with mild left sided UE and LE weakness Mobility  Bed Mobility Bed Mobility: Supine to Sit;Sit to Supine Rolling Right: Supervision/verbal cueing Rolling Left: Supervision/Verbal cueing Supine to Sit: Supervision/Verbal cueing Sit to Supine: Supervision/Verbal cueing Transfers Sit to Stand: Contact Guard/Touching assist Stand to Sit: Contact Guard/Touching assist  Trunk/Postural Assessment  Cervical Assessment Cervical Assessment: Exceptions to [facility] (left head tilt with rotation to the right) Thoracic Assessment Thoracic Assessment: Exceptions to [facility] (thoracic rounding) Lumbar Assessment Lumbar Assessment: Exceptions to [facility] (increased lumbar flexion and posterior pelvic tilt in sitting.) Postural Control Postural Control: Deficits on evaluation (mild L lateral lean)  Balance Balance Balance  Assessed: Yes Static Sitting Balance Static Sitting - Balance Support: Feet supported;Right upper extremity supported Static Sitting - Level of Assistance: 7: Independent Dynamic Sitting Balance Dynamic Sitting - Balance Support: Right upper extremity supported;Feet supported Dynamic Sitting - Level of Assistance: 5: Stand by assistance Extremity/Trunk Assessment RUE Assessment RUE Assessment: Within Functional Limits LUE Assessment LUE Assessment: Exceptions to [facility] Passive Range of Motion (PROM) Comments: WFLS Active Range of Motion (AROM) Comments: WFLS for all joints, except shoulder flexion 0-150 degrees approximately General Strength Comments: 3+/5 throughout with gross testing.  Slight tremor noted in the hand with functional use.  Decreased FM coordination noted with tying shoes at a slower rate, but still successful.   Jurline Folger OTR/L 03/24/2021, 3:58 PM

## 2021-03-24 NOTE — Progress Notes (Signed)
Inpatient Rehabilitation Care Coordinator Discharge Note  The overall goal for the admission was met for:   Discharge location: Yes, home  Length of Stay: Yes, 26 Days  Discharge activity level: Yes, ambulatory level Min Assist  Home/community participation: Yes  Services provided included: MD, RD, PT, OT, SLP, RN, CM, TR, Pharmacy, Neuropsych, and SW  Financial Services: Private Insurance: [employer]  Choices offered to/list presented BO:[id number]   Follow-up services arranged: Home Health: [facility]  Comments (or additional information): PT OT ST Rolling Walker, Transfer Bench, Drop Arm Commode  Patient/Family verbalized understanding of follow-up arrangements: Yes  Individual responsible for coordination of the follow-up plan: [name] [phone]  Confirmed correct DME delivered: Dyanne Iha 03/24/2021    Dyanne Iha

## 2021-03-24 NOTE — Progress Notes (Signed)
Speech Language Pathology Discharge Summary  Patient Details  Name: Tonya [name] MRN: [id number] Date of Birth: [birth year]  Today's Date: 03/24/2021 SLP Individual Time: 30-1545 SLP Individual Time Calculation (min): 60 min   Skilled Therapeutic Interventions: Patient agreeable to skilled ST intervention with focus on cognitive goals. [employer] Mental Status (SLUMS) administered: 15/30 (score of <27 considered impairment based on patient's educational history). Impairments this date characterized by decreased short-term recall, mental manipulation, calculations, sustained attention, and executive functions with clock drawing. Despite difficulty in some of the tested areas, it should be noted patient was fatigued and exhibited sleepiness during today's session which may have impacted performance. Improvement noted as compared to initial evaluation. See results below.  Orientation: 2/3 Word list recall: 2/5 (able to recall additional 2 with semantic cue) Calculations: 1/3 Generative Naming: 1/3 (difficulty concentrating) Mental manipulation/digit reversal: 1/2 Clock Drawing/Executive Function: 0/4 Visuospatial: 2/2 Paragraph Recall: 6/8   Reviewed results with patient including areas of strength and areas for continued improvement. Reviewed beneficial compensatory memory strategies including writing in memory notebook, calendar, and taking notes/to-do lists. Patient is eager to continue to improve with [place]. Patient was left in wheelchair with alarm activated and immediate needs within reach at end of session.  Patient has met 4 of 4 long term goals.  Patient to discharge at [facility] level.  Reasons goals not met: NA    Clinical Impression/Discharge Summary:  Patient has made excellent gains and has met 4 of 4 long term goals this admission. She is currently overall Min A for cognitive-linguistic tasks. She demonstrates significant improvement with level of  alertness, orientation, processing, sustained attention, emergent awareness, problem solving, and short-term recall. She continues to benefit from additional processing time for mildly-to-moderately complex problem solving/reasoning tasks, and left visual scanning. Patient still known to fatigue easily which impacts cognitive processing and ability to functionally attend to tasks. Family and patient education is complete and patient will discharge home with 24 hour supervision from family. Patient would benefit from [facility] SLP services to further maximize cognitive-linguistic function in order to maximize functional independence.     Care Partner:  Caregiver Able to Provide Assistance: Yes  Type of Caregiver Assistance: Physical;Cognitive  Recommendation:  Home Health SLP;24 hour supervision/assistance  Rationale for SLP Follow Up: Maximize cognitive function and independence;Reduce caregiver burden   Equipment: NA    Reasons for discharge: Treatment goals met   Patient/Family Agrees with Progress Made and Goals Achieved: Yes   [name], M.S., CCC-SLP  Krue Peterka T Casmere Hollenbeck 03/24/2021, 5:20 PM

## 2021-03-24 NOTE — Progress Notes (Signed)
Physical Therapy Discharge Summary  Patient Details  Name: Tonya [name] MRN: [id number] Date of Birth: 05-Sep-1946  Today's Date: 03/24/2021 PT Individual Time: 8916-9450 PT Individual Time Calculation (min): 45 min    Patient has met 10 of 11 long term goals due to improved activity tolerance, improved balance, improved postural control, increased strength, ability to compensate for deficits, and improved attention.  Patient to discharge at an ambulatory level [place].   Patient's care partner is independent to provide the necessary physical assistance at discharge.  Reasons goals not met: Pt was able to propel wc up to 32ft on straight path due to continued apraxia w/task.  Adequate for dc as only goal not fully achieved.  Recommendation:  Patient will benefit from ongoing skilled PT services in home health setting to continue to advance safe functional mobility, address ongoing impairments in balance, endurance, functional mobility, gait, strength, and minimize fall risk.  Equipment: Wc, RW  Reasons for discharge: treatment goals met  SKILLED THERAPEUTIC INTERVENTION: Pt participated in the following tasks: Wc propulsion using bilat Ues and using hemi technique w/cushion removed to improve access w/foot x 11ft w/supervision, cues, slowed progress. Car transfer w/RW and cga Ramp - ascended/descended 18ft ramp w/cga only. Furniture - transferred to/from using RW w/cga See below for DC assessment.  At end of session, Pt left oob in wc w/alarm belt set and needs in reach  PT Discharge Precautions/Restrictions Precautions Precautions: Fall Precaution Comments: L hemipareisis, L inattention, L field of vision cut Restrictions Weight Bearing Restrictions: No Vital Signs   Pain Pain Assessment Pain Score: 0-No pain Vision/Perception  Vision - Assessment Eye Alignment: Impaired (comment) Ocular Range of Motion: Restricted on the left Perception Perception:  Impaired Inattention/Neglect: Other (comment) (L inattention) Praxis Praxis: Impaired Praxis Impairment Details: Motor planning  Cognition Overall Cognitive Status: Impaired/Different from baseline Arousal/Alertness: Awake/alert Orientation Level: Oriented X4 Memory: Impaired Memory Impairment: Decreased recall of new information Safety/Judgment: Impaired Sensation Sensation Light Touch: Appears Intact Hot/Cold: Not tested Proprioception: Not tested Stereognosis: Not tested Coordination Gross Motor Movements are Fluid and Coordinated: No Coordination and Movement Description: toe taps slowed on L and jerky with heel to shin Motor  Motor Motor: Hemiplegia;Motor apraxia Motor - Discharge Observations: cues for sequencing still to back up to chair, needs increased time and cues to initiate,  L side weakness  Mobility Bed Mobility Bed Mobility: Supine to Sit;Sit to Supine Rolling Right: Supervision/verbal cueing Rolling Left: Supervision/Verbal cueing Supine to Sit: Supervision/Verbal cueing Sit to Supine: Supervision/Verbal cueing Transfers Sit to Stand: Supervision/Verbal cueing Stand to Sit: Supervision/Verbal cueing Stand Pivot Transfers: Contact Guard/Touching assist Stand Pivot Transfer Details (indicate cue type and reason): cues for positioning and assist for safety Transfer (Assistive device): Rolling walker Locomotion  Gait Ambulation: Yes Gait Assistance: Contact Guard/Touching assist Gait Distance (Feet): 150 Feet Assistive device: Rolling walker Gait Gait: Yes Gait Pattern: Impaired Gait Pattern: Step-through pattern;Decreased stride length;Decreased hip/knee flexion - left Stairs / Additional Locomotion Stairs: Yes Stairs Assistance: Minimal Assistance - Patient > 75% Stair Management Technique: Two rails;Alternating pattern;Forwards Number of Stairs: 4 Height of Stairs: 6 Wheelchair Mobility Wheelchair Mobility: Yes Wheelchair Assistance:  Chartered loss adjuster: Both upper extremities Wheelchair Parts Management: Needs assistance Distance: 25' (min A for 50')  Trunk/Postural Assessment  Cervical Assessment Cervical Assessment: Exceptions to [facility] (forward head, mild L tilt) Thoracic Assessment Thoracic Assessment: Exceptions to [facility] (rounded shoulders) Lumbar Assessment Lumbar Assessment: Exceptions to [facility] (posterior tilt) Postural Control Postural Control: Deficits on evaluation (  mild L lateral lean)  Balance Balance Balance Assessed: Yes Static Sitting Balance Static Sitting - Balance Support: Feet supported;Right upper extremity supported Static Sitting - Level of Assistance: 7: Independent Dynamic Sitting Balance Dynamic Sitting - Balance Support: Right upper extremity supported;Feet supported Dynamic Sitting - Level of Assistance: 5: Stand by assistance Static Standing Balance Static Standing - Balance Support: Bilateral upper extremity supported;During functional activity Static Standing - Level of Assistance: 5: Stand by assistance Dynamic Standing Balance Dynamic Standing - Balance Support: Bilateral upper extremity supported;During functional activity Dynamic Standing - Level of Assistance: 5: Stand by assistance Dynamic Standing - Balance Activities: Lateral lean/weight shifting;Reaching across midline;Reaching for objects Dynamic Standing - Comments: cga w/RW Extremity Assessment      RLE Assessment RLE Assessment: Within Functional Limits General Strength Comments: 4+ to 5/5 LLE Assessment LLE Assessment: Exceptions to [facility] Active Range of Motion (AROM) Comments: [facility] General Strength Comments: hip flexion 3+/5, knee extension 4-/5, ankle DF 4-/5   Callie Fielding, PT  Reginia Naas 03/24/2021, 12:58 PM

## 2021-03-24 NOTE — Progress Notes (Signed)
PROGRESS NOTE   Subjective/Complaints: Feels good "ecstatic" about going home  No HA No coccyx pain  ROS:   Pt denies SOB, abd pain, CP, N/V/C/D,   Objective:   No results found. Recent Labs    03/24/21 0758  WBC 4.1  HGB 9.2*  HCT 28.7*  PLT 235     Recent Labs    03/24/21 0758  NA 141  K 3.8  CL 114*  CO2 20*  GLUCOSE 89  BUN 19  CREATININE 0.81  CALCIUM 9.6       Intake/Output Summary (Last 24 hours) at 03/24/2021 1113 Last data filed at 03/24/2021 0900 Gross per 24 hour  Intake 600 ml  Output --  Net 600 ml         Physical Exam: Vital Signs Blood pressure 99/62, pulse 74, temperature 97.8 F (36.6 C), temperature source Oral, resp. rate 17, height 5\' 4"  (1.626 m), weight 57.9 kg, SpO2 96 %.    General: No acute distress Mood and affect are appropriate Heart: Regular rate and rhythm no rubs murmurs or extra sounds Lungs: Clear to auscultation, breathing unlabored, no rales or wheezes Abdomen: Positive bowel sounds, soft nontender to palpation, nondistended Extremities: No clubbing, cyanosis, or edema Skin: No evidence of breakdown, no evidence of rash  Neurologic: alert. Follows basic commands.  motor strength is 5/5 in right upper and lower limb.  3-/5 left upper: deltoid, bicep, tricep, grip. LLE: 3 - hip flexor, knee extensors, ankle dorsiflexor and plantar flexor, 3/5 ankle inversion eversion on the right side. Sensory exam normal sensation to light touch RIght and absent left  upper and lower extremities Musculoskeletal: Full range PROM in all 4 extremities.  Has pain with palpation and tightness in upper traps, neck, occiput as well over Low back in band and buttocks and R anterior thigh- piriformis on R side- sciatica Sx's    Assessment/Plan: 1. Functional deficits which require 3+ hours per day of interdisciplinary therapy in a comprehensive inpatient rehab setting. Physiatrist  is providing close team supervision and 24 hour management of active medical problems listed below. Physiatrist and rehab team continue to assess barriers to discharge/monitor patient progress toward functional and medical goals  Care Tool:  Bathing    Body parts bathed by patient: Left arm, Chest, Abdomen, Right upper leg, Left upper leg, Face, Right arm, Front perineal area, Buttocks, Right lower leg, Left lower leg   Body parts bathed by helper: Right lower leg, Left lower leg, Right arm, Front perineal area, Buttocks Body parts n/a: Right upper leg, Left upper leg, Buttocks, Front perineal area, Left lower leg, Right lower leg (did not attempt)   Bathing assist Assist Level: Minimal Assistance - Patient > 75%     Upper Body Dressing/Undressing Upper body dressing   What is the patient wearing?: Pull over shirt    Upper body assist Assist Level: Minimal Assistance - Patient > 75%    Lower Body Dressing/Undressing Lower body dressing      What is the patient wearing?: Pants, Incontinence brief     Lower body assist Assist for lower body dressing: Moderate Assistance - Patient 50 - 74%  Toileting Toileting    Toileting assist Assist for toileting: Minimal Assistance - Patient > 75%     Transfers Chair/bed transfer  Transfers assist  Chair/bed transfer activity did not occur: Safety/medical concerns  Chair/bed transfer assist level: Contact Guard/Touching assist Chair/bed transfer assistive device: Armrests, Programmer, multimedia   Ambulation assist   Ambulation activity did not occur: Refused  Assist level: Contact Guard/Touching assist Assistive device: Walker-rolling Max distance: 26ft   Walk 10 feet activity   Assist  Walk 10 feet activity did not occur: Refused  Assist level: Contact Guard/Touching assist Assistive device: Walker-rolling   Walk 50 feet activity   Assist Walk 50 feet with 2 turns activity did not occur:  Safety/medical concerns  Assist level: Contact Guard/Touching assist Assistive device: Walker-rolling    Walk 150 feet activity   Assist Walk 150 feet activity did not occur: Safety/medical concerns         Walk 10 feet on uneven surface  activity   Assist Walk 10 feet on uneven surfaces activity did not occur: Safety/medical concerns         Wheelchair     Assist Will patient use wheelchair at discharge?: Yes Type of Wheelchair: Manual      Max wheelchair distance: 20 ft    Wheelchair 50 feet with 2 turns activity    Assist        Assist Level: Maximal Assistance - Patient 25 - 49%   Wheelchair 150 feet activity     Assist      Assist Level: Dependent - Patient 0%, Total Assistance - Patient < 25%   Blood pressure 99/62, pulse 74, temperature 97.8 F (36.6 C), temperature source Oral, resp. rate 17, height 5\' 4"  (1.626 m), weight 57.9 kg, SpO2 96 %.    Medical Problem List and Plan: 1. 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  Antithrombotics: -DVT/anticoagulation:  Mechanical: Sequential compression devices, below knee Bilateral lower extremities             -antiplatelet therapy: N/A 3. Headaches/Pain Management: Oxycodone prn for HA. Recommended minimizing soft drinks and added sugar in diet and she is agreeable. Provided advice regarding drinking coffee at regular time every morning and eliminating other sources of caffeine during the day.   7/16- says still having pain in back and buttocks- con't regimen and patches- pt not sure they help, but certainly cannot hurt.             --see below 4. Anxiety: LCSW to follow for evaluation and support. Recommended to continue to minimize use of Ativan due to negative effects of long term use  on cognition.             -antipsychotic agents: N/A 5. Neuropsych: This patient is not capable of making decisions on her own behalf. 6. Skin/Wound Care: Routine pressure relief measures. D/c IV after magnesium supplementation as per patient request 7. Fluids/Electrolytes/Nutrition: Monitor I/O.  Continue IV fluids at night.  Plan to discontinue in a.m. and check bmet on Monday 8. HTN: Monitor BP tid--currently on Lisinopril.Marland Kitchen --BP lower but no dizziness  -stop lisinopril and hold  off on tizanidine as well  -encouraging fluids, HS IVF  -TEDS Vitals:   03/23/21 1941 03/24/21 0434  BP: 102/64 99/62  Pulse: 67 74  Resp: 18 17  Temp: 97.9 F (36.6 C) 97.8 F (36.6 C)  SpO2: 100% 96%   BP controlled  9. Seizure prophylaxis: As per discharge summary, neurology recommended discontinuing Keppra in case it was contributing to hallucinations, but states that hallucinations were present prior to admission and patient has been tremulous/twitchy raising some concern for seizure. 6/19 to 6/20 showed no evidence of seizures. Consider discontinuing 6/25 one week after ICH if remains seizure free. d/c'ed 6/27 10. Intermittent fevers: Being monitored and have resolved without treatment. 11. Anemia of chronic illness?: Continue to monitor H/H with serial checks--around 9 since 12/2020.expect some drop with IVF fluids due to hemodilution  12. Hypomagnesemia: Resume home dose of supplement. IV supplementation on 6/25 13. Hypokalemia: Will add daily supplement.just added KCL  14. 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  Freq BM reduced senna S to 1 po qhs 16.   HA : likely vascular and tension/muscular -continue topamax 75mg  BID -off tizanidine d/t hypotension  -oxycodone as needed -added lidocaine patches for neck/upper shoulders 17.  Pre renal azotemia. Continue IVF at [facility], encourage fluids -some  improvement in 7/7 labs -recheck BMET 7/11 much improved, D/C IVF , recheck BMET on Monday 7/18 7/16- d/c'd IVFs- will recheck Monday 18. Left sided sciatica: start gabapentin 100mg  TID 19. Insomnia: consider melatonin if sleep does not improve with gabapentin.   7/16- a little sleepy, but said slept OK- con't to monitor 20. Coccyx pain after fall: XR obtained and stable.   Improved 7/18  LOS: 25 days A FACE TO FACE EVALUATION WAS PERFORMED  Charlett Blake 03/24/2021, 11:13 AM

## 2021-03-24 NOTE — Plan of Care (Signed)
  Problem: RH Problem Solving Goal: LTG Patient will demonstrate problem solving for (SLP) Description: LTG:  Patient will demonstrate problem solving for basic/complex daily situations with cues  (SLP) Outcome: Completed/Met   Problem: RH Memory Goal: LTG Patient will demonstrate ability for day to day (SLP) Description: LTG:   Patient will demonstrate ability for day to day recall/carryover during cognitive/linguistic activities with assist  (SLP) Outcome: Completed/Met   Problem: RH Attention Goal: LTG Patient will demonstrate this level of attention during functional activites (SLP) Description: LTG:  Patient will will demonstrate this level of attention during functional activites (SLP) Outcome: Completed/Met   Problem: RH Awareness Goal: LTG: Patient will demonstrate awareness during functional activites type of (SLP) Description: LTG: Patient will demonstrate awareness during functional activites type of (SLP) Outcome: Completed/Met

## 2021-03-24 NOTE — Progress Notes (Signed)
Occupational Therapy Session Note  Patient Details  Name: Tonya [name] MRN: [id number] Date of Birth: 1946/02/20  Today's Date: 03/24/2021 OT Individual Time: 9892-1194 OT Individual Time Calculation (min): 32 min    Short Term Goals: Week 4:  OT Short Term Goal 1 (Week 4): Continue working on established LTGs set at [employer] assist level.  Skilled Therapeutic Interventions/Progress Updates:    Treatment session with focus on motor planning, sequencing,  LUE NMR, L attention, dynamic standing balance.  Pt received upright in w/c agreeable to therapy session.  Pt reports need to toilet.  Pt ambulated to toilet with RW with CGA and min cues for attention to obstacles on L.  Pt able to complete clothing management up and down with CGA in standing and hygiene while seated. Completed peg board pattern replication activity with focus on L attention and use of LUE.  Pt required mod question cues for problem solving and sequencing, and awareness of errors especially in L visual field.  Pt maintained standing balance with CGA.  Pt returned to room and remained upright in w/c with seat belt alarm on and all needs in reach.  Therapy Documentation Precautions:  Precautions Precautions: Fall Precaution Comments: L hemipareisis, L inattention, L field of vision cut Restrictions Weight Bearing Restrictions: No  Pain: Pain Assessment Pain Score: 0-No pain    Therapy/Group: Individual Therapy  Simonne Come 03/24/2021, 2:35 PM

## 2021-03-24 NOTE — Progress Notes (Signed)
Physical Therapy Session Note  Patient Details  Name: Tonya [name] MRN: [id number] Date of Birth: Jul 12, 1946  Today's Date: 03/24/2021 PT Individual Time: 2707-8675 PT Individual Time Calculation (min): 32 min   Short Term Goals: Week 4:  PT Short Term Goal 1 (Week 4): = to LTGs based on ELOS  Skilled Therapeutic Interventions/Progress Updates:    Patient in w/c with spouse in the room who reports no need for further education and pt agrees feels spouse does well assisting her.  Patient in w/c assisted to hallway where she propelled her w/c 34' with S then 17' with min A.  Then assisted to therapy gym in w/c.  Patient S for sit to stand with increased time and deliberate placement of L hand on w/c to push up then to RW.  Patient ambulated with RW x 150' with RW and S to CGA with cues for correcting veering to the L.  She negotiated 4 steps with 2 rails with CGA to min A using step over step technique and A for posterior lean as descending.  Patient with mod cues for backing to w/c with increased time and poor planning/spatial awareness.  Patient sit to stand and step to mat with RW and CGA to demonstrate sit<>supine with S.  Seated in w/c for LE strength assessment.  Patient needing increased time to lift L LE.  Patient denied dizziness or light headedness with transition.  Patient assisted to w/c and back to room in w/c total A.  Left with spouse in the room and alarm belt active, needs within reach.  Therapy Documentation Precautions:  Precautions Precautions: Fall Precaution Comments: L hemipareisis, L inattention, L field of vision cut Restrictions Weight Bearing Restrictions: No  Pain: Pain Assessment Pain Score: 0-No pain     Therapy/Group: Individual Therapy  Reginia Naas [place], [place] 03/24/2021, 2:27 PM

## 2021-03-25 ENCOUNTER — Encounter (HOSPITAL_COMMUNITY): Payer: Self-pay | Admitting: Physical Medicine & Rehabilitation

## 2021-03-25 DIAGNOSIS — I10 Essential (primary) hypertension: Secondary | ICD-10-CM

## 2021-03-25 DIAGNOSIS — G629 Polyneuropathy, unspecified: Secondary | ICD-10-CM

## 2021-03-25 DIAGNOSIS — F419 Anxiety disorder, unspecified: Secondary | ICD-10-CM

## 2021-03-25 DIAGNOSIS — R519 Headache, unspecified: Secondary | ICD-10-CM

## 2021-03-25 DIAGNOSIS — G8929 Other chronic pain: Secondary | ICD-10-CM

## 2021-03-25 DIAGNOSIS — M545 Low back pain, unspecified: Secondary | ICD-10-CM

## 2021-03-25 DIAGNOSIS — M5432 Sciatica, left side: Secondary | ICD-10-CM

## 2021-03-25 HISTORY — DX: Essential (primary) hypertension: I10

## 2021-03-25 MED ORDER — MAGNESIUM OXIDE 400 (241.3 MG) MG PO TABS: 400.0000 mg | ORAL_TABLET | Freq: Two times a day (BID) | ORAL | 1 refills | Status: DC

## 2021-03-25 MED ORDER — MUSCLE RUB 10-15 % EX CREA: 1.0000 "application " | TOPICAL_CREAM | Freq: Two times a day (BID) | CUTANEOUS | 0 refills | Status: DC

## 2021-03-25 MED ORDER — GABAPENTIN 300 MG PO CAPS: 300.0000 mg | ORAL_CAPSULE | Freq: Three times a day (TID) | ORAL | 0 refills | Status: DC

## 2021-03-25 MED ORDER — TOPIRAMATE 25 MG PO TABS: 75.0000 mg | ORAL_TABLET | Freq: Two times a day (BID) | ORAL | 0 refills | Status: DC

## 2021-03-25 MED ORDER — ACETAMINOPHEN 325 MG PO TABS: 325.0000 mg | ORAL_TABLET | ORAL | Status: AC | PRN

## 2021-03-25 MED ORDER — SENNOSIDES-DOCUSATE SODIUM 8.6-50 MG PO TABS: 1.0000 | ORAL_TABLET | Freq: Every day | ORAL | 0 refills | Status: DC

## 2021-03-25 MED ORDER — OXYCODONE-ACETAMINOPHEN 5-325 MG PO TABS: 1.0000 | ORAL_TABLET | Freq: Two times a day (BID) | ORAL | 0 refills | Status: DC | PRN

## 2021-03-25 MED ORDER — UMECLIDINIUM-VILANTEROL 62.5-25 MCG/INH IN AEPB: 1.0000 | INHALATION_SPRAY | Freq: Every day | RESPIRATORY_TRACT | 0 refills | Status: DC

## 2021-03-25 MED ORDER — LIDOCAINE 5 % EX PTCH: 3.0000 | MEDICATED_PATCH | CUTANEOUS | 0 refills | Status: AC

## 2021-03-25 NOTE — Discharge Summary (Signed)
Physician Discharge Summary  Patient ID: Tonya [name] MRN: [id number] DOB/AGE: 1946-04-09 75 y.o.  Admit date: 02/27/2021 Discharge date: 03/25/2021  Discharge Diagnoses:  Principal Problem:   Intraparenchymal hematoma of brain [facility]) Active Problems:   Frequent headaches   Sciatica of left side   Chronic low back pain   Anxiety disorder   Discharged Condition: good  Significant Diagnostic Studies: DG Sacrum/Coccyx  Result Date: 03/16/2021 CLINICAL DATA:  Sacral pain.  History of lung cancer. EXAM: SACRUM AND COCCYX - 2+ VIEW COMPARISON:  None. FINDINGS: Osseous alignment appears normal. No fracture line or displaced fracture fragment is seen. No focal cortical irregularity or osseous lesion is seen, although the lower sacrum and coccyx are partially obscured by overlying bowel. Atherosclerosis of the infrarenal abdominal aorta. IMPRESSION: 1. No acute findings. 2. No lytic or blastic lesions are seen within the sacrum or coccyx, although portions of the sacrum and coccyx are obscured by overlying bowel. If symptoms persist or worsen, consider further characterization with CT or MRI. Electronically Signed   By: Franki Cabot M.D.   On: 03/16/2021 12:33   CT HEAD WO CONTRAST  Result Date: 03/02/2021 CLINICAL DATA:  Headache, known intracranial hemorrhage. EXAM: CT HEAD WITHOUT CONTRAST TECHNIQUE: Contiguous axial images were obtained from the base of the skull through the vertex without intravenous contrast. COMPARISON:  CT February 22, 2021. FINDINGS: Brain: No substantial change in the size of the intraparenchymal hemorrhage centered in the high right parietal lobe, which measures approximately 4.0 x 3.0 x 4.7 cm (similar when remeasured on the prior). There is increased surrounding edema with similar sulcal effacement. No midline shift. Size increased effacement of the right lateral ventricle posteriorly. No visible intraventricular hemorrhage. No hydrocephalus. Similar right  convexity subarachnoid hemorrhage. Basal cistern hemorrhage has resolved. No evidence of acute large vascular territory infarct. Basal cisterns are patent. Vascular: No hyperdense vessel identified. Calcific intracranial atherosclerosis. Skull: No acute fracture. Sinuses/Orbits: Clear visualized sinuses. Other: No mastoid effusions. IMPRESSION: 1. No substantial change in the size of the intraparenchymal hemorrhage centered in the high right parietal lobe. Increased surrounding edema with slightly increased effacement of the right lateral ventricle posteriorly. No hydrocephalus or midline shift. 2. Similar right convexity subarachnoid hemorrhage. Basal cistern hemorrhage has resolved. Electronically Signed   By: Margaretha Sheffield MD   On: 03/02/2021 15:06     Labs:  Basic Metabolic Panel: BMP Latest Ref Rng & Units 03/24/2021 03/17/2021 03/13/2021  Glucose 70 - 99 mg/dL 89 148(H) 109(H)  BUN 8 - 23 mg/dL 19 19 43(H)  Creatinine 0.44 - 1.00 mg/dL 0.81 0.83 0.85  Sodium 135 - 145 mmol/L 141 135 135  Potassium 3.5 - 5.1 mmol/L 3.8 3.6 4.3  Chloride 98 - 111 mmol/L 114(H) 109 110  CO2 22 - 32 mmol/L 20(L) 19(L) 16(L)  Calcium 8.9 - 10.3 mg/dL 9.6 9.5 10.1     CBC: CBC Latest Ref Rng & Units 03/24/2021 03/17/2021 03/10/2021  WBC 4.0 - 10.5 K/uL 4.1 8.2 7.5  Hemoglobin 12.0 - 15.0 g/dL 9.2(L) 9.1(L) 11.4(L)  Hematocrit 36.0 - 46.0 % 28.7(L) 28.0(L) 35.4(L)  Platelets 150 - 400 K/uL 235 201 253     Recent blood pressures:  Vitals:   03/21/21 1433 03/21/21 2122 03/22/21 0453 03/22/21 1314  BP: (!) 97/59 122/62 103/66 104/62   03/22/21 1947 03/23/21 0514 03/23/21 1340 03/23/21 1941  BP: (!) 113/58 116/70 119/60 102/64   03/24/21 0434 03/24/21 1500 03/24/21 2051 03/25/21 0531  BP: 99/62 110/65 (!) 114/55  110/64     CBG: No results for input(s): GLUCAP in the last 168 hours.  Brief HPI:   Tonya [name] is a 75 y.o. female with history of lung cancer s/p RUL lobectomy with ongoing chemo,  bradycardia in the past, who was admitted via outside hospital on 02/22/2021 with reports of headache followed by confusion and progressive stuporous state.  CT head showed large intraparenchymal hemorrhage within right parietal lobe as well as mild right frontal subarachnoid hemorrhage and patient had tremulousness with concerns of seizure activity.  She was intubated for airway protection and transferred to [facility] for [facility].  Dr. Venetia Constable was consulted for input and recommended follow-up MRI/MRA to rule out mass or vascular abnormality.  MRI/MRA brain showed stable appearance of right parietal bleed without mass or vascular malformation, subdural subarachnoid hemorrhage and small amount of blood within the ventricles without evidence of stenosis, aneurysm or occlusion.  MRV of head was negative.    She was started on Keppra due to right upper extremity tremors.  EEG done showing cortical dysfunction with diffuse encephalopathy which was felt to be nonspecific.  Hospital significant for issues with tachycardia, elevated blood pressures as well as mild dysphagia.  Dr. Leonie Man was following for input and felt that bleed was likely hypertensive in nature but to repeat MRI of brain in 2 to 3 months due to unclear etiology of bleed.  She continued to have deficits due to left-sided weakness with right gaze preference, balance deficits, high-level cognitive deficits as well as issues with hallucinations.  CIR will was recommended due to functional decline.   Hospital Course: Tonya [name] was admitted to rehab 02/27/2021 for inpatient therapies to consist of PT, ST and OT at least three hours five days a week. Past admission physiatrist, therapy team and rehab RN have worked together to provide customized collaborative inpatient rehab.  She continued to have bouts of lethargy with hallucinations.  Patient was seizure-free during his stay therefore Keppra was discontinued after a week.   Intermittent fevers have resolved and no signs of infection noted.  Serial check of CBC showed acute blood loss anemia to be stable with hemoglobin in the 9-10 range.  Serial check of electrolytes showed prerenal azotemia which was treated with IV fluids briefly.  As p.o. intake improved she was encouraged to increase fluid intake and follow-up labs shows renal status to be within normal limits.Blood pressures were monitored on TID basis and and started trending downwards with onset of dizziness therefore lisinopril was discontinued.  Blood pressures are currently controlled without medications.   She has had issues with headaches therefore Topamax was added and titrated upwards with good results.  She did sustain a fall with reports of coccygeal pain and x-rays done were negative for fracture.  Low-dose gabapentin was added and titrated upwards to help manage acute on chronic back pain as well as left-sided sciatica.  Gabapentin has been titrated up to 300 mg 3 times daily and oxycodone was tapered to 5 mg twice daily as needed at discharge.  Her bowel program has been adjusted during the stay to help manage constipation and avoid diarrhea side effects.   Medications changes were reviewed extensively with husband who is to manage medications at discharge.  Hallucinations have resolved with improvement in activity tolerance.  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  She currently requires min assist overall and will continue to receive follow-up home health PT,  OT and ST after discharge   Rehab course: During patient's stay in rehab weekly team conferences were held to monitor patient's progress, set goals and discuss barriers to discharge. At admission, patient required max assist with mobility and with ADL tasks. She exhibited moderate to severe cognitive impairments impacting her language as well as function and continued to have issues with hallucination and  poor awareness of deficits. She  has had improvement in activity tolerance, balance, postural control as well as ability to compensate for deficits. She has had improvement in functional use LUE  and LLE as well as improvement in awareness.  She requires min assist with moderate demonstrated cues occasionally to complete ADL tasks.  She requires supervision for transfers and contact-guard assist to ambulate 150 feet with rolling walker. She currently requires min assist with cognitive linguistic tasks.  SLUMS score at discharge is 15/30.  Family education was completed regarding all aspects of safety, care and cognitive assistance needed.  Discharge disposition: 01-Home or Self Care  Diet: Heart healthy  Special Instructions: No driving. Husband to manage medications.  Discharge Instructions     Ambulatory referral to Physical Medicine Rehab   Complete by: As directed    1-2 WEEK TC APPT      Allergies as of 03/25/2021       Reactions   Other Anaphylaxis   Spiders   Penicillins Hives   REACTION: 20 years   Tizanidine    Significant hypotension        Medication List     STOP taking these medications    aspirin EC 81 MG tablet   cyclobenzaprine 5 MG tablet Commonly known as: FLEXERIL   lisinopril 10 MG tablet Commonly known as: ZESTRIL   omeprazole 40 MG capsule Commonly known as: PRILOSEC   oxyCODONE-acetaminophen 10-325 MG tablet Commonly known as: PERCOCET Replaced by: oxyCODONE-acetaminophen 5-325 MG tablet   pregabalin 25 MG capsule Commonly known as: LYRICA   promethazine 12.5 MG tablet Commonly known as: PHENERGAN       TAKE these medications    acetaminophen 325 MG tablet Commonly known as: TYLENOL Take 1-2 tablets (325-650 mg total) by mouth every 4 (four) hours as needed for mild pain. What changed:  medication strength how much to take when to take this reasons to take this   albuterol 108 (90 Base) MCG/ACT inhaler Commonly known as:  VENTOLIN HFA Inhale 2 puffs into the lungs every 6 (six) hours as needed for wheezing or shortness of breath.   alendronate 70 MG tablet Commonly known as: FOSAMAX Take 70 mg by mouth every Saturday.   ALPRAZolam 1 MG tablet Commonly known as: XANAX Take 1 mg by mouth in the morning, at noon, and at bedtime.   CALCIUM + D PO Take 1 tablet by mouth 2 (two) times daily. Notes to patient: CAN RESUME--OVER THE COUNTER   folic acid 1 MG tablet Commonly known as: FOLVITE TAKE 1 TABLET(1 MG) BY MOUTH DAILY What changed: See the new instructions. Notes to patient: OVER THE COUNTER   gabapentin 300 MG capsule Commonly known as: NEURONTIN Take 1 capsule (300 mg total) by mouth 3 (three) times daily. Notes to patient: THIS TAKES THE PLACE OF PREGABALIN    lidocaine 5 % Commonly known as: LIDODERM Place 3 patches onto the skin daily. Apply at 8 am and remove at 8 pm daily. Notes to patient: YOU WILL NEED TO PURCHASE THIS OVER THE COUNTER IF INSURANCE DOES NOT COVER IT   magnesium  oxide 400 (241.3 Mg) MG tablet Commonly known as: MAG-OX Take 1 tablet (400 mg total) by mouth 2 (two) times daily. Notes to patient: CAN PURCHASE OVER THE COUNTER IF INSURANCE DOES NOT COVER IT.    Muscle Rub 10-15 % Crea Apply 1 application topically 2 (two) times daily. Notes to patient: OVER THE COUNTER   oxyCODONE-acetaminophen 5-325 MG tablet--Rx #28 pills Commonly known as: PERCOCET/ROXICET Take 1-2 tablets by mouth 2 (two) times daily as needed for severe pain (severe pain). Replaces: oxyCODONE-acetaminophen 10-325 MG tablet Notes to patient: LIMIT TO ONE PILL TWICE A DAY AS NEEDED--NOTE THAT THIS IS 5 MG (SHE HAS 10 MG AT HOME)   rosuvastatin 10 MG tablet Commonly known as: CRESTOR Take 10 mg by mouth at bedtime.   senna-docusate 8.6-50 MG tablet Commonly known as: Senokot-S Take 1 tablet by mouth at bedtime.   topiramate 25 MG tablet Commonly known as: TOPAMAX Take 3 tablets (75 mg  total) by mouth 2 (two) times daily.   umeclidinium-vilanterol 62.5-25 MCG/INH Aepb Commonly known as: ANORO ELLIPTA Inhale 1 puff into the lungs daily. Start taking on: March 26, 2021        Follow-up Information     Judith Part, MD. Call.   Specialty: Neurosurgery Why: As needed Contact information: [place] [address] [phone]         [facility]. Call in 1 day(s).   Why: for stroke follow up Contact information: [address]     [place] [id number] [phone]        Charlett Blake, MD Follow up.   Specialty: Physical Medicine and Rehabilitation Why: Office will call you with follow up appointment Contact information: [place] [address] [phone]         Lin Landsman, MD. Call in 1 day(s).   Specialty: Family Medicine Why: [place] information: [place] [address] [phone]                 Signed: Bary Leriche 03/25/2021, 5:42 PM

## 2021-03-25 NOTE — Progress Notes (Signed)
PROGRESS NOTE   Subjective/Complaints: No pain except Chronic low back pain, was taking 1-2 percocet at home each day prescribed by PCP Dr Rosezella [place] No HA Discussed cognitive issues and prognosis Pt "ok with not driving" ROS:   Pt denies SOB, abd pain, CP, N/V/C/D,   Objective:   No results found. Recent Labs    03/24/21 0758  WBC 4.1  HGB 9.2*  HCT 28.7*  PLT 235      Recent Labs    03/24/21 0758  NA 141  K 3.8  CL 114*  CO2 20*  GLUCOSE 89  BUN 19  CREATININE 0.81  CALCIUM 9.6        Intake/Output Summary (Last 24 hours) at 03/25/2021 0850 Last data filed at 03/24/2021 1750 Gross per 24 hour  Intake 600 ml  Output --  Net 600 ml         Physical Exam: Vital Signs Blood pressure 110/64, pulse (!) 57, temperature 98.1 F (36.7 C), temperature source Oral, resp. rate 16, height 5\' 4"  (1.626 m), weight 59.2 kg, SpO2 90 %.    General: No acute distress Mood and affect are appropriate Heart: Regular rate and rhythm no rubs murmurs or extra sounds Lungs: Clear to auscultation, breathing unlabored, no rales or wheezes Abdomen: Positive bowel sounds, soft nontender to palpation, nondistended Extremities: No clubbing, cyanosis, or edema Skin: No evidence of breakdown, no evidence of rash  Neurologic: alert. Follows basic commands.  motor strength is 5/5 in right upper and lower limb.  3-/5 left upper: deltoid, bicep, tricep, grip. LLE: 3 - hip flexor, knee extensors, ankle dorsiflexor and plantar flexor, 3/5 ankle inversion eversion on the right side. Sensory exam normal sensation to light touch RIght and absent left  upper and lower extremities Musculoskeletal: Full range PROM in all 4 extremities.  Has pain with palpation and tightness in upper traps, neck, occiput as well over Low back in band and buttocks and R anterior thigh- piriformis on R side- sciatica Sx's    Assessment/Plan: 1.  Functional deficits which require 3+ hours per day of interdisciplinary therapy in a comprehensive inpatient rehab setting. Physiatrist is providing close team supervision and 24 hour management of active medical problems listed below. Physiatrist and rehab team continue to assess barriers to discharge/monitor patient progress toward functional and medical goals  Care Tool:  Bathing    Body parts bathed by patient: Left arm, Chest, Abdomen, Right upper leg, Left upper leg, Face, Right arm, Front perineal area, Buttocks, Right lower leg, Left lower leg   Body parts bathed by helper: Right lower leg, Left lower leg, Right arm, Front perineal area, Buttocks Body parts n/a: Right upper leg, Left upper leg, Buttocks, Front perineal area, Left lower leg, Right lower leg (did not attempt)   Bathing assist Assist Level: Minimal Assistance - Patient > 75%     Upper Body Dressing/Undressing Upper body dressing   What is the patient wearing?: Pull over shirt    Upper body assist Assist Level: Minimal Assistance - Patient > 75%    Lower Body Dressing/Undressing Lower body dressing      What is the patient wearing?: Pants, Incontinence brief  Lower body assist Assist for lower body dressing: Moderate Assistance - Patient 50 - 74%     Toileting Toileting    Toileting assist Assist for toileting: Contact Guard/Touching assist     Transfers Chair/bed transfer  Transfers assist  Chair/bed transfer activity did not occur: Safety/medical concerns  Chair/bed transfer assist level: Contact Guard/Touching assist Chair/bed transfer assistive device: Armrests, Programmer, multimedia   Ambulation assist   Ambulation activity did not occur: Refused  Assist level: Contact Guard/Touching assist Assistive device: Walker-rolling Max distance: 150'   Walk 10 feet activity   Assist  Walk 10 feet activity did not occur: Refused  Assist level: Contact Guard/Touching  assist Assistive device: Walker-rolling   Walk 50 feet activity   Assist Walk 50 feet with 2 turns activity did not occur: Safety/medical concerns  Assist level: Contact Guard/Touching assist Assistive device: Walker-rolling    Walk 150 feet activity   Assist Walk 150 feet activity did not occur: Safety/medical concerns  Assist level: Contact Guard/Touching assist Assistive device: Walker-rolling    Walk 10 feet on uneven surface  activity   Assist Walk 10 feet on uneven surfaces activity did not occur: Safety/medical concerns   Assist level: Contact Guard/Touching assist Assistive device: Aeronautical engineer Will patient use wheelchair at discharge?: Yes Type of Wheelchair: Manual    Wheelchair assist level: Supervision/Verbal cueing Max wheelchair distance: 50    Wheelchair 50 feet with 2 turns activity    Assist        Assist Level: Supervision/Verbal cueing   Wheelchair 150 feet activity     Assist      Assist Level: Dependent - Patient 0%   Blood pressure 110/64, pulse (!) 57, temperature 98.1 F (36.7 C), temperature source Oral, resp. rate 16, height 5\' 4"  (1.626 m), weight 59.2 kg, SpO2 90 %.    Medical Problem List and Plan: 1. 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  Antithrombotics: -DVT/anticoagulation:  Mechanical: Sequential compression devices, below knee Bilateral lower extremities             -antiplatelet therapy: N/A 3. Headaches/Pain Management: Oxycodone prn for HA. Recommended minimizing soft drinks and added sugar in diet and she is agreeable. Provided advice regarding drinking coffee at regular time every morning and eliminating other sources of caffeine during the day.   7/16- says still  having pain in back and buttocks- con't regimen and patches- pt not sure they help, but certainly cannot hurt.             --see below 4. Anxiety: LCSW to follow for evaluation and support. Recommended to continue to minimize use of Ativan due to negative effects of long term use on cognition.             -antipsychotic agents: N/A 5. Neuropsych: This patient is not capable of making decisions on her own behalf. 6. Skin/Wound Care: Routine pressure relief measures. D/c IV after magnesium supplementation as per patient request 7. Fluids/Electrolytes/Nutrition: Monitor I/O.  Continue IV fluids at night.  Plan to discontinue in a.m. and check bmet on Monday 8.  HTN: Monitor BP tid--currently on Lisinopril.Marland Kitchen --BP lower but no dizziness  -stop lisinopril and hold off on tizanidine as well  -encouraging fluids, HS IVF  -TEDS Vitals:   03/25/21 0531 03/25/21 0815  BP: 110/64   Pulse: (!) 57   Resp: 16   Temp: 98.1 F (36.7 C)   SpO2: 100% 90%   BP controlled  9. Seizure prophylaxis: As per discharge summary, neurology recommended discontinuing Keppra in case it was contributing to hallucinations, but states that hallucinations were present prior to admission and patient has been tremulous/twitchy raising some concern for seizure. 6/19 to 6/20 showed no evidence of seizures. Consider discontinuing 6/25 one week after ICH if remains seizure free. d/c'ed 6/27 10. Intermittent fevers: Being monitored and have resolved without treatment. 11. Anemia of chronic illness?: Continue to monitor H/H with serial checks--around 9 since 12/2020.expect some drop with IVF fluids due to hemodilution  Hgb stable at 9.2 on 7/18- f/u PCP 12. Hypomagnesemia: Resume home dose of supplement. IV supplementation on 6/25 13. Hypokalemia: Will add daily supplement.just added KCL  14. 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  Freq BM reduced  senna S to 1 po qhs 16.   HA : likely vascular and tension/muscular -continue topamax 75mg  BID -off tizanidine d/t hypotension  -oxycodone as needed -added lidocaine patches for neck/upper shoulders 17.  Pre renal azotemia. Continue IVF at [facility], encourage fluids -some improvement in 7/7 labs -recheck BMET 7/11 much improved, D/C IVF , recheck BMET on Monday 7/18 7/16- d/c'd IVFs- will recheck Monday 18. Left sided sciatica: start gabapentin 100mg  TID 19. Insomnia: consider melatonin if sleep does not improve with gabapentin.   7/16- a little sleepy, but said slept OK- con't to monitor 20. Coccyx pain after fall: XR obtained and stable.   Improved 7/18 21.  Chronic Low back pain - can give 2 wk supply of percocet 5mg   1po BID prn #28 with no refills to f/u with Dr Loma Sousa LOS: 26 days A FACE TO [place] E Susannah Carbin 03/25/2021, 8:50 AM

## 2021-03-25 NOTE — Progress Notes (Signed)
Patient ID: Tonya [name], female   DOB: [birth year], 75 y.o.   MRN: [id number] Follow up with the patient regarding pending discharge. Reports she feels a little scared but ready to go home. Feels like she is prepared and knows how to contact resources for assistance as needed. Margarito Liner

## 2021-03-26 ENCOUNTER — Encounter: Payer: Self-pay | Admitting: Physical Medicine & Rehabilitation

## 2021-03-27 ENCOUNTER — Encounter: Payer: Self-pay | Admitting: Internal Medicine

## 2021-04-15 ENCOUNTER — Encounter: Payer: Medicare HMO | Attending: Physical Medicine & Rehabilitation | Admitting: Physical Medicine & Rehabilitation

## 2021-04-15 ENCOUNTER — Encounter: Payer: Self-pay | Admitting: Physical Medicine & Rehabilitation

## 2021-04-15 ENCOUNTER — Other Ambulatory Visit: Payer: Self-pay

## 2021-04-15 VITALS — BP 124/74 | HR 76 | Temp 99.3°F | Ht 64.0 in | Wt 131.8 lb

## 2021-04-15 DIAGNOSIS — I619 Nontraumatic intracerebral hemorrhage, unspecified: Secondary | ICD-10-CM | POA: Insufficient documentation

## 2021-04-15 DIAGNOSIS — G8194 Hemiplegia, unspecified affecting left nondominant side: Secondary | ICD-10-CM | POA: Diagnosis present

## 2021-04-15 NOTE — Patient Instructions (Signed)
Reduce Topamax as follows  2 tablets twice a day for 1 week   1 tablet twice a day for 1 week  Then may discontinue if headaches do not worsen

## 2021-04-15 NOTE — Progress Notes (Signed)
Subjective:    Patient ID: Tonya [name], female    DOB: [birth year], 75 y.o.   MRN: [id number] 75 y.o. female with history of lung cancer s/p RUL lobectomy with ongoing chemo, bradycardia in the past, who was admitted via [place] [facility] on 02/22/2021 with reports of headache followed by confusion and progressive stuporous state.  CT head showed large intraparenchymal hemorrhage within right parietal lobe as well as mild right frontal subarachnoid hemorrhage and patient had tremulousness with concerns of seizure activity.  She was intubated for airway protection and transferred to [facility] for management.  Dr. Venetia Constable was consulted for input and recommended follow-up MRI/MRA to rule out mass or vascular abnormality.  MRI/MRA brain showed stable appearance of right parietal bleed without mass or vascular malformation, subdural subarachnoid hemorrhage and small amount of blood within the ventricles without evidence of stenosis, aneurysm or occlusion.  MRV of head was negative.     She was started on Keppra due to right upper extremity tremors.  EEG done showing cortical dysfunction with diffuse encephalopathy which was felt to be nonspecific.  Hospital significant for issues with tachycardia, elevated blood pressures as well as mild dysphagia.  Dr. Leonie Man was following for input and felt that bleed was likely hypertensive in nature but to repeat MRI of brain in 2 to 3 months due to unclear etiology of bleed.  She continued to have deficits due to left-sided weakness with right gaze preference, balance deficits, high-level cognitive deficits as well as issues with hallucinations.  CIR will was recommended due to functional decline. Admit date: 02/27/2021 Discharge date: 03/25/2021 HPI  Mod I dressing and bathing , walking at [place] , in back yard Does some cooking and a little cleaning  Still with Left neglect, has some balance issues Had a minor fall reaching over while opening a  drawer HHPT, OT, SLP  +ETOH, +THC Married 1 level home Wants to go to [employer] for birthday in a couple weeks  Amb without AD Pain Inventory Average Pain 5 Pain Right Now 5 My pain is constant and throb  LOCATION OF PAIN  Right hip, right leg, right ankle  BOWEL Number of stools per week: 4 Oral laxative use No  Type of laxative none Enema or suppository use No  History of colostomy No  Incontinent No   BLADDER Normal In and out cath, frequency n/a Able to self cath No  Bladder incontinence No  Frequent urination No  Leakage with coughing No  Difficulty starting stream No  Incomplete bladder emptying No    Mobility walk with assistance how many minutes can you walk? No problem walking. Problem with balance.  ability to climb steps?  yes do you drive?  no  Function retired  Neuro/Psych weakness spasms dizziness confusion  Prior Studies Any changes since last visit?  no NEW PATIENT  Physicians involved in your care Any changes since last visit?  no NEW PATIENT   Family History  Problem Relation Age of Onset   Arrhythmia Mother        s/p Pacer    Heart attack Father    Heart disease Sister    Colon cancer Neg Hx    Social History   Socioeconomic History   Marital status: Married    Spouse name: Not on file   Number of children: 3   Years of education: Not on file   Highest education level: Not on file  Occupational History   Not on file  Tobacco Use   Smoking status: Former    Packs/day: 0.30    Years: 60.00    Pack years: 18.00    Types: Cigarettes    Quit date: 06/11/2020    Years since quitting: 0.8   Smokeless tobacco: Never  Vaping Use   Vaping Use: Never used  Substance and Sexual Activity   Alcohol use: Yes    Comment: 0.5 drink per month   Drug use: No   Sexual activity: Not Currently    Birth control/protection: Surgical    Comment: Hysterectomy  Other Topics Concern   Not on file  Social History Narrative   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    Social Determinants of Health   Financial Resource Strain: Not on file  Food Insecurity: Not on file  Transportation Needs: Not on file  Physical Activity: Not on file  Stress: Not on file  Social Connections: Not on file   Past Surgical History:  Procedure Laterality Date   ABDOMINAL HYSTERECTOMY     APPENDECTOMY     BRONCHIAL BIOPSY  07/18/2020   Procedure: BRONCHIAL BIOPSIES;  Surgeon: [name], DO;  Location: [facility] [facility];  Service: Pulmonary;;   BRONCHIAL BRUSHINGS  07/18/2020   Procedure: BRONCHIAL BRUSHINGS;  Surgeon: [name], DO;  Location: [place];  Service: Pulmonary;;   BRONCHIAL NEEDLE ASPIRATION BIOPSY  07/18/2020   Procedure: BRONCHIAL NEEDLE ASPIRATION BIOPSIES;  Surgeon: [name], DO;  Location: [place] [facility];  Service: Pulmonary;;   BRONCHIAL WASHINGS  07/18/2020   Procedure: BRONCHIAL WASHINGS;  Surgeon: [name], DO;  Location: [place] [facility];  Service: Pulmonary;;   FIDUCIAL MARKER PLACEMENT  07/18/2020   Procedure: FIDUCIAL MARKER PLACEMENT;  Surgeon: [name], DO;  Location: [place] [facility];  Service: Pulmonary;;   INTERCOSTAL NERVE BLOCK Right 08/26/2020   Procedure: INTERCOSTAL NERVE BLOCK;  Surgeon: [name], MD;  Location: [place];  Service: Thoracic;  Laterality: Right;   IR THORACENTESIS ASP PLEURAL SPACE W/IMG GUIDE  10/10/2020   NODE DISSECTION Right 08/26/2020   Procedure: NODE DISSECTION;  Surgeon: [name], MD;  Location: [place];  Service: Thoracic;  Laterality: Right;   THORACENTESIS Right 11/04/2020   Procedure: [facility];  Surgeon: [name], DO;  Location: [place] [facility];  Service: Pulmonary;  Laterality: Right;   THORACENTESIS Right 11/29/2020   Procedure: THORACENTESIS;  Surgeon: [name], DO;  Location[facility];  Service: Pulmonary;  Laterality: Right;   VIDEO BRONCHOSCOPY WITH ENDOBRONCHIAL NAVIGATION N/A 07/18/2020   Procedure: VIDEO  BRONCHOSCOPY WITH ENDOBRONCHIAL NAVIGATION;  Surgeon: [name], DO;  Location: [place];  Service: Pulmonary;  Laterality: N/A;   VIDEO BRONCHOSCOPY WITH ENDOBRONCHIAL ULTRASOUND  07/18/2020   Procedure: VIDEO BRONCHOSCOPY WITH ENDOBRONCHIAL ULTRASOUND;  Surgeon: [name], DO;  Location: [facility];  Service: Pulmonary;;   Past Medical History:  Diagnosis Date   Adenoma of left adrenal gland    Cancer ([place])    Lung   Colon polyps    hyperplastic   Complication of anesthesia    Very emotional and cries after anesthesia   GERD (gastroesophageal reflux disease)    High blood pressure 03/25/2021   Kidney stone    Liver lesion    Microhematuria    BP 124/74   Pulse 76   Temp 99.3 F (37.4 C)   Ht 5\' 4"  (1.626 m)   Wt 131 lb 12.8 oz (59.8 kg)   SpO2 98%   BMI 22.62 kg/m  Opioid Risk Score:   Fall Risk Score:  `1  Depression screen PHQ 2/9  Depression screen PHQ 2/9 04/15/2021  Decreased Interest 0  Down, Depressed, Hopeless 0  PHQ - 2 Score 0  Altered sleeping 1  Tired, decreased energy 1  Change in appetite 1  Feeling bad or failure about yourself  0  Trouble concentrating 0  Moving slowly or fidgety/restless 0  PHQ-9 Score 3    Review of Systems  Eyes:  Positive for visual disturbance.  Musculoskeletal:        Off balance, spasms  Neurological:  Positive for dizziness, weakness and headaches.  Psychiatric/Behavioral:  Positive for confusion.   All other systems reviewed and are negative.     Objective:   Physical Exam Vitals and nursing note reviewed.  Constitutional:      Appearance: Normal appearance.  HENT:     Head: Normocephalic and atraumatic.  Eyes:     General: No visual field deficit.    Extraocular Movements: Extraocular movements intact.     Conjunctiva/sclera: Conjunctivae normal.     Pupils: Pupils are equal, round, and reactive to light.  Cardiovascular:     Rate and Rhythm: Normal rate and regular rhythm.     Pulses:  Normal pulses.     Heart sounds: Normal heart sounds.  Pulmonary:     Effort: Pulmonary effort is normal. No respiratory distress.     Breath sounds: Normal breath sounds.  Abdominal:     General: Abdomen is flat. Bowel sounds are normal. There is no distension.     Palpations: Abdomen is soft.  Musculoskeletal:     Cervical back: Normal range of motion.  Neurological:     Mental Status: She is alert and oriented to person, place, and time.     Cranial Nerves: No dysarthria.     Motor: No abnormal muscle tone.     Coordination: Coordination abnormal.     Gait: Tandem walk abnormal.     Comments: Motor strength is 5/5 in the right deltoid, bicep, tricep, grip, hip flexor, knee extensor, ankle dorsiflexor plantar flexor 4/5 in the left deltoid, bicep, tricep, grip, hip flexor, 5/5 knee extensor 4/5 in left ankle dorsiflexor. Sensation reports equal bilateral upper limbs Decreased fine motor coordination left upper extremity.  Psychiatric:        Mood and Affect: Affect is labile.        Speech: Speech normal.        Behavior: Behavior is cooperative.        Thought Content: Thought content normal.        Cognition and Memory: Cognition is impaired. She does not exhibit impaired recent memory.     Comments: Unable to perform 7s   Unable to do serial 7 Remembers 3/3 objects after a delay        Assessment & Plan:   #1.  Right parietal hemorrhage, per neuro appears to be hypertensive although patient denies having had elevated blood pressures prior to CVA.  While in the hospital the patient did have elevated blood pressures.  Blood pressures have normalized and now she is normotensive. The patient still has some higher-level balance issues, mild left sided weakness as well as incoordination as result of this hemorrhagic infarct.  In addition the patient continues have cognitive deficits although she is no longer having visual illusions.  She is having problems with concentration and  we discussed that this may take a longer period of time i.e. many months  before we know the full degree of her cognitive recovery. She still has some mild perceptual issues bumps into things on the left although on confrontation testing appears to have improved. The patient has concerns whether Topamax may be contributing to her cognitive dysfunction.  We stated that it is possible although it is likely that most of her deficits are stroke related. Given that patient's post stroke headaches have improved, have recommended patient taper down on the Topamax to 50 mg twice daily for a week then 25 twice daily for a week and then discontinue if headaches do not recur.  Continue home health PT OT speech.  We will transition outpatient therapy once home health is wrapping up, patient's husband will call. Follow-up with neurology is supposed to get a repeat MRI 2 to 3 months post stroke. Physical medicine rehab follow-up in around 6 weeks.

## 2021-04-17 ENCOUNTER — Telehealth: Payer: Self-pay | Admitting: *Deleted

## 2021-04-17 DIAGNOSIS — G8194 Hemiplegia, unspecified affecting left nondominant side: Secondary | ICD-10-CM

## 2021-04-17 DIAGNOSIS — I619 Nontraumatic intracerebral hemorrhage, unspecified: Secondary | ICD-10-CM

## 2021-04-17 NOTE — Telephone Encounter (Signed)
Mrs Willaim Rayas called back per Dr Letta Pate request to report how much more therapy she will be receiving in the home.  She reports that she will have 3 weeks more of therapy OT/PT/ST (1xweek for 3 weeks).

## 2021-04-18 NOTE — Telephone Encounter (Signed)
Per Dr Letta Pate, orders placed for OT [facility] beginning in three weeks.

## 2021-04-29 ENCOUNTER — Inpatient Hospital Stay: Payer: Medicare HMO | Admitting: Adult Health

## 2021-04-29 ENCOUNTER — Inpatient Hospital Stay: Payer: Self-pay | Admitting: Adult Health

## 2021-05-05 ENCOUNTER — Inpatient Hospital Stay: Payer: Medicare HMO

## 2021-05-06 ENCOUNTER — Ambulatory Visit (HOSPITAL_COMMUNITY)
Admission: RE | Admit: 2021-05-06 | Discharge: 2021-05-06 | Disposition: A | Discharge status: Home / Self Care | Payer: Medicare HMO | Source: Ambulatory Visit | Attending: Physician Assistant | Admitting: Physician Assistant

## 2021-05-06 ENCOUNTER — Other Ambulatory Visit: Payer: Self-pay

## 2021-05-06 ENCOUNTER — Inpatient Hospital Stay: Payer: Medicare HMO | Attending: Internal Medicine

## 2021-05-06 DIAGNOSIS — C3491 Malignant neoplasm of unspecified part of right bronchus or lung: Secondary | ICD-10-CM | POA: Insufficient documentation

## 2021-05-06 DIAGNOSIS — B370 Candidal stomatitis: Secondary | ICD-10-CM | POA: Diagnosis present

## 2021-05-06 DIAGNOSIS — Z85118 Personal history of other malignant neoplasm of bronchus and lung: Secondary | ICD-10-CM | POA: Diagnosis present

## 2021-05-06 LAB — CMP (CANCER CENTER ONLY)
ALT: 6 U/L (ref 0–44)
AST: 12 U/L — ABNORMAL LOW (ref 15–41)
Albumin: 3.4 g/dL — ABNORMAL LOW (ref 3.5–5.0)
Alkaline Phosphatase: 65 U/L (ref 38–126)
Anion gap: 8 (ref 5–15)
BUN: 10 mg/dL (ref 8–23)
CO2: 23 mmol/L (ref 22–32)
Calcium: 8.9 mg/dL (ref 8.9–10.3)
Chloride: 114 mmol/L — ABNORMAL HIGH (ref 98–111)
Creatinine: 0.91 mg/dL (ref 0.44–1.00)
GFR, Estimated: >60 mL/min (ref >60)
Glucose, Bld: 79 mg/dL (ref 70–99)
Potassium: 3.5 mmol/L (ref 3.5–5.1)
Sodium: 145 mmol/L (ref 135–145)
Total Bilirubin: 0.3 mg/dL (ref 0.3–1.2)
Total Protein: 6.3 g/dL — ABNORMAL LOW (ref 6.5–8.1)

## 2021-05-06 LAB — CBC WITH DIFFERENTIAL (CANCER CENTER ONLY)
Abs Immature Granulocytes: 0.01 10*3/uL (ref 0.00–0.07)
Basophils Absolute: 0 10*3/uL (ref 0.0–0.1)
Basophils Relative: 0 %
Eosinophils Absolute: 0.1 10*3/uL (ref 0.0–0.5)
Eosinophils Relative: 3 %
HCT: 29.2 % — ABNORMAL LOW (ref 36.0–46.0)
Hemoglobin: 9.5 g/dL — ABNORMAL LOW (ref 12.0–15.0)
Immature Granulocytes: 0 %
Lymphocytes Relative: 38 %
Lymphs Abs: 1.4 10*3/uL (ref 0.7–4.0)
MCH: 33.2 pg (ref 26.0–34.0)
MCHC: 32.5 g/dL (ref 30.0–36.0)
MCV: 102.1 fL — ABNORMAL HIGH (ref 80.0–100.0)
Monocytes Absolute: 0.4 10*3/uL (ref 0.1–1.0)
Monocytes Relative: 11 %
Neutro Abs: 1.8 10*3/uL (ref 1.7–7.7)
Neutrophils Relative %: 48 %
Platelet Count: 193 10*3/uL (ref 150–400)
RBC: 2.86 MIL/uL — ABNORMAL LOW (ref 3.87–5.11)
RDW: 12.5 % (ref 11.5–15.5)
WBC Count: 3.7 10*3/uL — ABNORMAL LOW (ref 4.0–10.5)
nRBC: 0 % (ref 0.0–0.2)

## 2021-05-06 MED ORDER — IOHEXOL 350 MG/ML SOLN
60.0000 mL | Freq: Once | INTRAVENOUS | Status: AC | PRN
  Administered 2021-05-06: 60 mL via INTRAVENOUS

## 2021-05-06 NOTE — Progress Notes (Signed)
[place], [place], [place] [address]  DIAGNOSIS:  Stage IIB (T3, N0, M0) non-small cell lung cancer, adenocarcinoma involving the right upper lobe. The patient also has evidence for adenocarcinoma of the left upper lobe lung nodule as well as subcentimeter pulmonary nodules in the right lung concerning for stage IV lung cancer with multifocal disease bilaterally.   Biomarker Findings  Microsatellite status - MS-Equivocal ? Tumor Mutational Burden - Cannot Be Determined Genomic Findings For a complete list of the genes assayed, please refer to the Appendix. MET [id number]*2 AXL T343M TP53 I254V 7 Disease relevant genes with no reportable alterations: ALK, BRAF, EGFR, ERBB2, KRAS, RET, ROS1   PDL1 Expression 20 %.  PRIOR THERAPY:  1) status post right upper lobectomy with lymph node sampling on August 26, 2020 under the care of Dr. Kipp Brood. 2) Adjuvant systemic chemotherapy with cisplatin 75 mg/M2 and Alimta 550/M2 every 3 weeks. Last dose 01/17/21.  Status post 4 cycles  CURRENT THERAPY: Observation   INTERVAL HISTORY: Tonya [name] 75 y.o. female returns to the clinic today for a follow-up visit accompanied by her husband.  The patient was last seen in the clinic on 02/06/2021.  At that time, the patient completed 4 cycles of adjuvant chemotherapy.  She was given the option of maintenance immunotherapy with Tecentriq for 1 year but she opted to think about her options vs continue on observation with a restaging CT scan of the chest and 3 months.  In the interval since her last appointment, the patient was hospitalized from 02/22/2021 until July 2022 after presenting to the emergency room with a headache.  She is found to have a intraparenchymal hemorrhage neurology felt that this was secondary to hypertension but she has a follow-up appointment with them later this month for repeat imaging.  She underwent physical  therapy and Occupational Therapy.  She is planning to start outpatient therapy soon.  Overall, the patient otherwise is doing fairly well.  She reports some bilateral ankle and foot swelling which started when she was in the hospital.  He has been trying to elevate her legs and use compression stockings without appreciable improvement.  She denies any calf pain.  She denies any overlying skin changes.  She is not on any Lasix; likely because she states that her blood pressure has been low lately.  She also reports occasional orthostatic hypotension with standing too quickly and will feel lightheaded for a few seconds.  She denies any fever, chills, or night sweats.  She denies any unusual weight loss.  Her breathing is fair and she denies any chest pain, shortness of breath, cough, or hemoptysis except occasional shortness of breath with doing her physical therapy exercises.  She denies any nausea, vomiting, diarrhea, or constipation.  She denies any headache or visual changes and states that her vision is getting better.  The patient recently had a restaging CT scan performed.  She is here today for evaluation to review her scan results.    MEDICAL HISTORY: Past Medical History:  Diagnosis Date   Adenoma of left adrenal gland    Cancer ([place])    Lung   Colon polyps    hyperplastic   Complication of anesthesia    Very emotional and cries after anesthesia   GERD (gastroesophageal reflux disease)    High blood pressure 03/25/2021   Kidney stone    Liver lesion    Microhematuria  ALLERGIES:  is allergic to other, penicillins, and tizanidine.  MEDICATIONS:  Current Outpatient Medications  Medication Sig Dispense Refill   acetaminophen (TYLENOL) 325 MG tablet Take 1-2 tablets (325-650 mg total) by mouth every 4 (four) hours as needed for mild pain.     albuterol (VENTOLIN HFA) 108 (90 Base) MCG/ACT inhaler Inhale 2 puffs into the lungs every 6 (six) hours as needed for wheezing or shortness  of breath. 8 g 2   alendronate (FOSAMAX) 70 MG tablet Take 70 mg by mouth every Saturday.     ALPRAZolam (XANAX) 1 MG tablet Take 1 mg by mouth in the morning, at noon, and at bedtime.     Calcium Carbonate-Vitamin D (CALCIUM + D PO) Take 1 tablet by mouth 2 (two) times daily.     folic acid (FOLVITE) 1 MG tablet TAKE 1 TABLET(1 MG) BY MOUTH DAILY 30 tablet 4   lidocaine (LIDODERM) 5 % Place 3 patches onto the skin daily. Apply at 8 am and remove at 8 pm daily. 90 patch 0   magnesium oxide (MAG-OX) 400 (241.3 Mg) MG tablet Take 1 tablet (400 mg total) by mouth 2 (two) times daily. 60 tablet 1   Menthol-Methyl Salicylate (MUSCLE RUB) 10-15 % CREA Apply 1 application topically 2 (two) times daily.  0   omeprazole (PRILOSEC) 40 MG capsule Take 40 mg by mouth daily.     oxyCODONE-acetaminophen (PERCOCET/ROXICET) 5-325 MG tablet Take 1-2 tablets by mouth 2 (two) times daily as needed for severe pain (severe pain). 28 tablet 0   rosuvastatin (CRESTOR) 10 MG tablet Take 10 mg by mouth at bedtime.     senna-docusate (SENOKOT-S) 8.6-50 MG tablet Take 1 tablet by mouth at bedtime. 30 tablet 0   topiramate (TOPAMAX) 25 MG tablet Take 3 tablets (75 mg total) by mouth 2 (two) times daily. 180 tablet 0   umeclidinium-vilanterol (ANORO ELLIPTA) 62.5-25 MCG/INH AEPB Inhale 1 puff into the lungs daily. 60 each 0   No current facility-administered medications for this visit.    SURGICAL HISTORY:  Past Surgical History:  Procedure Laterality Date   ABDOMINAL HYSTERECTOMY     APPENDECTOMY     BRONCHIAL BIOPSY  07/18/2020   Procedure: BRONCHIAL BIOPSIES;  Surgeon: Josephine Igo, DO;  Location: [facility];  Service: Pulmonary;;   BRONCHIAL BRUSHINGS  07/18/2020   Procedure: BRONCHIAL BRUSHINGS;  Surgeon: Josephine Igo, DO;  Location: [facility];  Service: Pulmonary;;   BRONCHIAL NEEDLE ASPIRATION BIOPSY  07/18/2020   Procedure: BRONCHIAL NEEDLE ASPIRATION BIOPSIES;  Surgeon: Josephine Igo, DO;   Location: [facility];  Service: Pulmonary;;   BRONCHIAL WASHINGS  07/18/2020   Procedure: BRONCHIAL WASHINGS;  Surgeon: Josephine Igo, DO;  Location: [facility];  Service: Pulmonary;;   FIDUCIAL MARKER PLACEMENT  07/18/2020   Procedure: FIDUCIAL MARKER PLACEMENT;  Surgeon: Josephine Igo, DO;  Location: [facility];  Service: Pulmonary;;   INTERCOSTAL NERVE BLOCK Right 08/26/2020   Procedure: INTERCOSTAL NERVE BLOCK;  Surgeon: Corliss Skains, MD;  Location: [facility];  Service: Thoracic;  Laterality: Right;   IR THORACENTESIS ASP PLEURAL SPACE W/IMG GUIDE  10/10/2020   NODE DISSECTION Right 08/26/2020   Procedure: NODE DISSECTION;  Surgeon: Corliss Skains, MD;  Location: [facility];  Service: Thoracic;  Laterality: Right;   THORACENTESIS Right 11/04/2020   Procedure: [place] Puls;  Surgeon: Josephine Igo, DO;  Location: [facility];  Service: Pulmonary;  Laterality: Right;   THORACENTESIS Right 11/29/2020   Procedure: THORACENTESIS;  Surgeon: [name], DO;  Location: [facility];  Service: Pulmonary;  Laterality: Right;   VIDEO BRONCHOSCOPY WITH ENDOBRONCHIAL NAVIGATION N/A 07/18/2020   Procedure: VIDEO BRONCHOSCOPY WITH ENDOBRONCHIAL NAVIGATION;  Surgeon: [name], DO;  Location: [facility];  Service: Pulmonary;  Laterality: N/A;   VIDEO BRONCHOSCOPY WITH ENDOBRONCHIAL ULTRASOUND  07/18/2020   Procedure: VIDEO BRONCHOSCOPY WITH ENDOBRONCHIAL ULTRASOUND;  Surgeon: [name], DO;  Location: [facility];  Service: Pulmonary;;    REVIEW OF SYSTEMS:   Review of Systems  Constitutional: Negative for appetite change, chills, fatigue, fever and unexpected weight change.  HENT:  Negative for mouth sores, nosebleeds, sore throat and trouble swallowing.   Eyes: Negative for eye problems and icterus.  Respiratory: Negative for cough, hemoptysis, shortness of breath and wheezing.   Cardiovascular: Positive for bilateral lower extremity swelling. negative for  chest pain. Gastrointestinal: Negative for abdominal pain, constipation, diarrhea, nausea and vomiting.  Genitourinary: Negative for bladder incontinence, difficulty urinating, dysuria, frequency and hematuria.   Musculoskeletal: Negative for back pain, gait problem, neck pain and neck stiffness.  Skin: Negative for itching and rash.  Neurological: Positive for orthostatic hypotension/lightheadedness.  Negative for dizziness, extremity weakness, gait problem, headaches, and seizures.  Hematological: Negative for adenopathy. Does not bruise/bleed easily.  Psychiatric/Behavioral: Negative for confusion, depression and sleep disturbance. The patient is not nervous/anxious.     PHYSICAL EXAMINATION:  Blood pressure 123/75, pulse 70, temperature 98.2 F (36.8 C), temperature source Tympanic, resp. rate 18, height $RemoveBe'5\' 4"'LYAeRHZup$  (1.626 m), weight 140 lb 3.2 oz (63.6 kg), SpO2 100 %.  ECOG PERFORMANCE STATUS: 1  Physical Exam  Constitutional: Oriented to person, place, and time and well-developed, well-nourished, and in no distress.   HENT:  Head: Normocephalic and atraumatic.  Mouth/Throat: Oropharynx is clear and moist. No oropharyngeal exudate.  Eyes: Conjunctivae are normal. Right eye exhibits no discharge. Left eye exhibits no discharge. No scleral icterus.  Neck: Normal range of motion. Neck supple.  Cardiovascular: Normal rate, regular rhythm, normal heart sounds and intact distal pulses.   Pulmonary/Chest: Effort normal and breath sounds normal. No respiratory distress. No wheezes. No rales.  Abdominal: Soft. Bowel sounds are normal. Exhibits no distension and no mass. There is no tenderness.  Musculoskeletal: Normal range of motion.  Bilateral ankle and foot swelling.  No overlying skin changes.  No calf tenderness. Lymphadenopathy:    No cervical adenopathy.  Neurological: Alert and oriented to person, place, and time. Exhibits normal muscle tone. Gait normal. Coordination normal.  Skin: Skin  is warm and dry. No rash noted. Not diaphoretic. No erythema. No pallor.  Psychiatric: Mood, memory and judgment normal.  Vitals reviewed.  LABORATORY DATA: Lab Results  Component Value Date   WBC 3.7 (L) 05/06/2021   HGB 9.5 (L) 05/06/2021   HCT 29.2 (L) 05/06/2021   MCV 102.1 (H) 05/06/2021   PLT 193 05/06/2021      Chemistry      Component Value Date/Time   NA 145 05/06/2021 1528   K 3.5 05/06/2021 1528   CL 114 (H) 05/06/2021 1528   CO2 23 05/06/2021 1528   BUN 10 05/06/2021 1528   CREATININE 0.91 05/06/2021 1528      Component Value Date/Time   CALCIUM 8.9 05/06/2021 1528   ALKPHOS 65 05/06/2021 1528   AST 12 (L) 05/06/2021 1528   ALT 6 05/06/2021 1528   BILITOT 0.3 05/06/2021 1528       RADIOGRAPHIC STUDIES:  CT Chest W Contrast  Result Date:  05/07/2021 CLINICAL DATA:  Primary Cancer Type: Lung Imaging Indication: Routine surveillance Interval therapy since last imaging? No Initial Cancer Diagnosis Date: 08/26/2020; Established by: Biopsy-proven Detailed Pathology: Stage IIB non-small cell lung cancer, adenocarcinoma. Primary Tumor location: Right upper lobe.   Left apical nodule. Surgeries: Right upper lobectomy 08/26/2020. Hysterectomy, appendectomy. Chemotherapy: Yes; Ongoing? No; Most recent administration: 01/17/2021 Immunotherapy? No Radiation therapy? No EXAM: CT CHEST WITH CONTRAST TECHNIQUE: Multidetector CT imaging of the chest was performed during intravenous contrast administration. CONTRAST:  39mL OMNIPAQUE IOHEXOL 350 MG/ML SOLN COMPARISON:  Most recent CT chest 02/04/2021.  07/03/2020 PET-CT. FINDINGS: Cardiovascular: Heart size is increased. No pericardial effusion. Mild atherosclerotic calcification is noted in the wall of the thoracic aorta. Mediastinum/Nodes: No mediastinal lymphadenopathy. There is no hilar lymphadenopathy. The esophagus has normal imaging features. There is no axillary lymphadenopathy. Lungs/Pleura: Volume loss right hemithorax  consistent with prior right upper lobectomy. Staple line in the parahilar right lung with adjacent scarring is stable. Architectural distortion and scarring at the right base is unchanged. Previously measured 8 x 6 mm left apical nodule is stable at 8 x 6 mm today on image 17/7. Adjacent fiducial markers evident. Tiny peripheral left upper lobe nodule on 44/7 is unchanged. No new suspicious nodule or mass. No focal airspace consolidation. No pleural effusion. Upper Abdomen: Similar appearance of intra and extrahepatic biliary duct dilatation. Tiny focus of subcapsular hyperenhancement seen previously in the dome of the liver is less prominent today on image 98/2. 2.9 cm low-density lesion upper pole left kidney has been incompletely visualized, approaching water attenuation likely a cyst. Musculoskeletal: No worrisome lytic or sclerotic osseous abnormality. IMPRESSION: 1. No new or progressive interval findings to suggest recurrent or metastatic disease. 2. Tiny spiculated left upper lobe pulmonary nodule is unchanged. 3. Interval resolution right pleural effusion. 4. The tiny focus of hyperenhancement identified in the subcapsular hepatic dome previously persists but is less prominent today. Likely benign, attention on follow-up recommended. 5. Stable intra and extrahepatic biliary duct dilatation. 6.  Aortic Atherosclerois (ICD10-170.0) Electronically Signed   By: Misty Stanley M.D.   On: 05/07/2021 11:48     ASSESSMENT/PLAN:  This is a very pleasant 75 year old African-American female diagnosed with stage IIb (T3, N0, M0) non-small cell lung cancer, adenocarcinoma.  This involves the right upper lobe with 2 nodules.  The patient also has evidence of adenocarcinoma in the left upper lobe and subcentimeter nodules in the right lung concerning for stage IV with multifocal disease bilaterally.  She was diagnosed in December 2021.  Her PD-L1 expression is 20%.  Her molecular studies by [facility] 1 is negative for  any actionable mutations.  The patient previously underwent a right upper lobectomy lymph node dissection on August 26, 2020 under the care of Dr. Kipp Brood.    The patient completed 4 cycles of adjuvan chemotherapy with cisplatin 75 mg per metered squared and Alimta 500 mg per metered squared IV every 3 weeks.  Last dose was on 01/17/2021.  Before she can make a decision about 1 year of immunotherapy with Tecentriq, she was hospitalized.  Dr. Julien Nordmann would not recommend adjuvant immunotherapy at this time.  The patient recently had a restaging CT scan performed.  Dr. Julien Nordmann personally and independently reviewed the scan discussed the results with the patient today.  The scan showed no evidence of disease progression. Dr. Julien Nordmann recommends that she continue on observation with a restaging CT scan of the chest in 3 months.  We will see her back for  follow-up at that time for evaluation and to review her scan results.  She will continue to follow with neurology due to her history of intracranial hemorrhage.   We will arrange for a bilateral lower extremity ultrasound to rule out DVT.  If negative, recommend she follow-up with her primary care provider for further evaluation and management.  The patient is already elevating her lower extremities and wearing compression stockings without significant relief.  She is likely not a good candidate for Lasix due to her normal/borderline low blood pressure.  She describes what sounds like is orthostatic hypotension.  Advised her to change positions slowly and stay hydrated.  The patient was advised to call immediately if she has any concerning symptoms in the interval. The patient voices understanding of current disease status and treatment options and is in agreement with the current care plan. All questions were answered. The patient knows to call the clinic with any problems, questions or concerns. We can certainly see the patient much sooner if  necessary   Orders Placed This Encounter  Procedures   CT Chest W Contrast    Standing Status:   Future    Standing Expiration Date:   05/08/2022    Order Specific Question:   If indicated for the ordered procedure, I authorize the administration of contrast media per Radiology protocol    Answer:   Yes    Order Specific Question:   Preferred imaging location?    Answer:   [facility]   CBC with Differential ([facility] Only)    Standing Status:   Future    Standing Expiration Date:   05/08/2022   CMP ([facility] only)    Standing Status:   Future    Standing Expiration Date:   05/08/2022       Tobe Sos Mardi Cannady, PA-C 05/08/21  ADDENDUM: Hematology/Oncology Attending: I had a face-to-face encounter with the patient today.  I reviewed her record, lab and scan and recommended her care plan.  This is a very pleasant 75 years old African-American female who was diagnosed with a stage IIb (T3, N0, M0) non-small cell lung cancer, adenocarcinoma status post right upper lobectomy with lymph node sampling in December 2021 followed by 4 cycles of adjuvant systemic chemotherapy with cisplatin and Alimta.  The patient tolerated her adjuvant therapy fairly well.  She is currently on observation and was seen in June 2022 with no evidence for disease recurrence. 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  She is currently feeling much better and undergoing physical and occupational therapy. The patient is feeling fine today with no concerning complaints.  She had repeat CT scan of the chest performed recently.  I personally and independently reviewed the scan images and discussed the results with the patient and her husband. Her scan showed no concerning findings for disease recurrence or metastasis. I recommended for the patient to continue on observation  with repeat CT scan of the chest in 3 months.  It is currently too late to consider The patient for the adjuvant immunotherapy. The patient and her husband agreed to the current plan. She was advised to call immediately if she has any other concerning symptoms in the interval. Disclaimer: This note was dictated with voice recognition software. Similar sounding words can inadvertently be transcribed and may be missed upon review. Eilleen Kempf, MD 05/08/21

## 2021-05-08 ENCOUNTER — Inpatient Hospital Stay: Payer: Medicare HMO | Attending: Internal Medicine | Admitting: Physician Assistant

## 2021-05-08 ENCOUNTER — Other Ambulatory Visit: Payer: Self-pay

## 2021-05-08 VITALS — BP 123/75 | HR 70 | Temp 98.2°F | Resp 18 | Ht 64.0 in | Wt 140.2 lb

## 2021-05-08 DIAGNOSIS — C3491 Malignant neoplasm of unspecified part of right bronchus or lung: Secondary | ICD-10-CM | POA: Diagnosis not present

## 2021-05-08 DIAGNOSIS — M7989 Other specified soft tissue disorders: Secondary | ICD-10-CM | POA: Insufficient documentation

## 2021-05-08 DIAGNOSIS — Z85118 Personal history of other malignant neoplasm of bronchus and lung: Secondary | ICD-10-CM | POA: Diagnosis not present

## 2021-05-09 ENCOUNTER — Other Ambulatory Visit: Payer: Medicare HMO

## 2021-05-13 ENCOUNTER — Ambulatory Visit: Payer: Medicare HMO | Admitting: Internal Medicine

## 2021-05-15 ENCOUNTER — Ambulatory Visit (HOSPITAL_COMMUNITY)
Admission: RE | Admit: 2021-05-15 | Discharge: 2021-05-15 | Disposition: A | Discharge status: Home / Self Care | Payer: Medicare HMO | Source: Ambulatory Visit | Attending: Physician Assistant | Admitting: Physician Assistant

## 2021-05-15 ENCOUNTER — Telehealth: Payer: Self-pay | Admitting: Physician Assistant

## 2021-05-15 ENCOUNTER — Other Ambulatory Visit: Payer: Self-pay

## 2021-05-15 DIAGNOSIS — C3491 Malignant neoplasm of unspecified part of right bronchus or lung: Secondary | ICD-10-CM | POA: Insufficient documentation

## 2021-05-15 DIAGNOSIS — M7989 Other specified soft tissue disorders: Secondary | ICD-10-CM | POA: Diagnosis present

## 2021-05-15 NOTE — Telephone Encounter (Signed)
I called the patient and let her know her doppler ultrasound was negative for DVT. Recommend she elevate her legs, reduce her intake of foods rich in sodium, and wear compression stockings. If no improvement, recommend that she follow up with her PCP for further evaluation. She expressed understanding and was appreciative of the call.

## 2021-05-29 ENCOUNTER — Ambulatory Visit: Payer: Medicare HMO | Attending: Physical Medicine & Rehabilitation | Admitting: Rehabilitation

## 2021-05-29 ENCOUNTER — Ambulatory Visit: Payer: Medicare HMO

## 2021-05-29 ENCOUNTER — Encounter: Payer: Self-pay | Admitting: Rehabilitation

## 2021-05-29 ENCOUNTER — Other Ambulatory Visit: Payer: Self-pay

## 2021-05-29 ENCOUNTER — Ambulatory Visit: Payer: Medicare HMO | Admitting: Occupational Therapy

## 2021-05-29 DIAGNOSIS — R41841 Cognitive communication deficit: Secondary | ICD-10-CM

## 2021-05-29 DIAGNOSIS — R41842 Visuospatial deficit: Secondary | ICD-10-CM

## 2021-05-29 DIAGNOSIS — R41840 Attention and concentration deficit: Secondary | ICD-10-CM

## 2021-05-29 DIAGNOSIS — R41844 Frontal lobe and executive function deficit: Secondary | ICD-10-CM | POA: Diagnosis present

## 2021-05-29 DIAGNOSIS — I69118 Other symptoms and signs 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: Secondary | ICD-10-CM | POA: Diagnosis present

## 2021-05-29 DIAGNOSIS — R2681 Unsteadiness on feet: Secondary | ICD-10-CM | POA: Insufficient documentation

## 2021-05-29 DIAGNOSIS — M6281 Muscle weakness (generalized): Secondary | ICD-10-CM | POA: Insufficient documentation

## 2021-05-29 DIAGNOSIS — R278 Other lack of coordination: Secondary | ICD-10-CM | POA: Insufficient documentation

## 2021-05-29 DIAGNOSIS — R2689 Other abnormalities of gait and mobility: Secondary | ICD-10-CM | POA: Insufficient documentation

## 2021-05-29 DIAGNOSIS — I69154 Hemiplegia and hemiparesis following nontraumatic intracerebral hemorrhage affecting left non-dominant side: Secondary | ICD-10-CM | POA: Diagnosis present

## 2021-05-29 NOTE — Therapy (Signed)
[facility] [address] [place] Tonya [name], [place], [address] Phone: [phone]   Fax:  [phone]  Occupational Therapy Evaluation  Patient Details  Name: Tonya [name] MRN: [id number] Date of Birth: May 23, 1946 Referring Provider (OT): Dr. Alysia Penna   Encounter Date: 05/29/2021   OT End of Session - 05/29/21 1224     Visit Number 1    Number of Visits 17    Date for OT Re-Evaluation 07/28/21    Authorization Type [employer]; Out of pocket has been met in full, patient is covered 100%  Prior auth required--awaiting auth    Authorization - Visit Number 1    Authorization - Number of Visits 10    Progress Note Due on Visit 10    OT Start Time 1103    OT Stop Time 1148    OT Time Calculation (min) 45 min    Equipment Utilized During Treatment FOTO completed but NP (score of functional intake measure of 91 for upper extremity; however, primary deficits are cognition, visual-perceptual, and balance)    Activity Tolerance Patient tolerated treatment well    Behavior During Therapy Flat affect             Past Medical History:  Diagnosis Date   Adenoma of left adrenal gland    Cancer ([place])    Lung   Colon polyps    hyperplastic   Complication of anesthesia    Very emotional and cries after anesthesia   GERD (gastroesophageal reflux disease)    High blood pressure 03/25/2021   Kidney stone    Liver lesion    Microhematuria     Past Surgical History:  Procedure Laterality Date   ABDOMINAL HYSTERECTOMY     APPENDECTOMY     BRONCHIAL BIOPSY  07/18/2020   Procedure: BRONCHIAL BIOPSIES;  Surgeon: [name], DO;  Location: [facility];  Service: Pulmonary;;   BRONCHIAL BRUSHINGS  07/18/2020   Procedure: BRONCHIAL BRUSHINGS;  Surgeon: [name], DO;  Location: [facility];  Service: Pulmonary;;   BRONCHIAL NEEDLE ASPIRATION BIOPSY  07/18/2020   Procedure: BRONCHIAL NEEDLE  ASPIRATION BIOPSIES;  Surgeon: [name], DO;  Location: [facility];  Service: Pulmonary;;   BRONCHIAL WASHINGS  07/18/2020   Procedure: BRONCHIAL WASHINGS;  Surgeon: [name], DO;  Location: [facility];  Service: Pulmonary;;   FIDUCIAL MARKER PLACEMENT  07/18/2020   Procedure: FIDUCIAL MARKER PLACEMENT;  Surgeon: [name], DO;  Location: [facility];  Service: Pulmonary;;   INTERCOSTAL NERVE BLOCK Right 08/26/2020   Procedure: INTERCOSTAL NERVE BLOCK;  Surgeon: [name], MD;  Location: [facility];  Service: Thoracic;  Laterality: Right;   IR THORACENTESIS ASP PLEURAL SPACE W/IMG GUIDE  10/10/2020   NODE DISSECTION Right 08/26/2020   Procedure: NODE DISSECTION;  Surgeon: [name], MD;  Location: [facility];  Service: Thoracic;  Laterality: Right;   THORACENTESIS Right 11/04/2020   Procedure: [facility];  Surgeon: [name], DO;  Location: [facility];  Service: Pulmonary;  Laterality: Right;   THORACENTESIS Right 11/29/2020   Procedure: THORACENTESIS;  Surgeon: [name], DO;  Location: [facility];  Service: Pulmonary;  Laterality: Right;   VIDEO BRONCHOSCOPY WITH ENDOBRONCHIAL NAVIGATION N/A 07/18/2020   Procedure: VIDEO BRONCHOSCOPY WITH ENDOBRONCHIAL NAVIGATION;  Surgeon: [name], DO;  Location: [facility];  Service: Pulmonary;  Laterality: N/A;   VIDEO BRONCHOSCOPY WITH ENDOBRONCHIAL ULTRASOUND  07/18/2020   Procedure: VIDEO BRONCHOSCOPY WITH ENDOBRONCHIAL ULTRASOUND;  Surgeon:  [name], DO;  Location: [facility];  Service: Pulmonary;;    There were no vitals filed for this visit.   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    PMH includes: lung cancer s/p R upper lobectomy, currently undergoing chemo, GERD, HLD, bradycardia    Limitations  fall risk, L inattention    Patient Stated Goals be indendepent and return to driving    Currently in Pain? 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 Alysia Penna    Onset Date/Surgical Date 02/22/21    Hand Dominance Right    Prior Therapy CIR followed by home health therapies      Precautions   Precautions Fall   no driving     Balance Screen   Has the patient fallen in the past 6 months Yes   tilted over when reaching for a pan   How many times? 1    Has the patient had a decrease in activity level because of a fear of falling?  No    Is the patient reluctant to leave their home because of a fear of falling?  No      Home  Environment   Family/patient expects to be discharged to: Private residence    Lives With Spouse      Prior Function   Level of [place] Retired    Leisure travel, read, entertain, spend time with family      ADL   Eating/Feeding Modified independent    Grooming Modified independent    Upper Body Bathing Modified independent    Lower Body Bathing Modified independent    Upper Body Dressing --   mod I   Lower Body Dressing Modified independent    Toilet Transfer Modified independent    Toileting - Clothing Manipulation Modified independent    Toileting -  Education administrator tub bench;Grab bars;Walk in shower       IADL   Prior Level of Function Shopping independent    Shopping Assistance for transportation   pt has forgotten items, husband picks up bigger items   Prior Level of Function Light Housekeeping independent    Light Housekeeping Does not participate in any housekeeping tasks   decluttering, sweeping, mopping, dusting, laundry   Prior Level of Function Meal Prep independent    Meal Prep --   gets all ingredients out first, has forgotten to turn off stove x1, forgot to turn off water 1x  Prior Level of Function Scientist, research (physical sciences) Relies on family or friends for transportation    Prior Level of Function Medication Managment independent    Medication Management Is responsible for taking medication in correct dosages at correct time    Prior Level of Function Financial Management independent    Financial Management --   pt is performing, but pt reports has paid bill late a few times     Mobility   Mobility Status Independent    Mobility Status Comments noted LOB x1 and needed to grab table to recover when standing, husband reports that pt stumbles, pt reports bumping into things of L side      Vision - History   Baseline Vision Wears glasses all the time   for distance     Vision Assessment   Ocular Range of Motion Within Functional Limits    Saccades Within functional limits    Visual Fields --   grossly WFL with minor difficulty with clarity in inferior fields bilaterally   Diplopia Assessment --   denies   Comment Pt reports difficulty with L peripheral vision, but improved.  Pt reports that she bumps into things of L side.  Simple environmental scanning with 8/12 items found (67% accuracy).  simple number cancellation with 100% accuracy, incr time      Cognition   Overall Cognitive Status Impaired/Different from baseline    Area of Impairment Attention;Memory;Problem solving    Current Attention Level Sustained    Attention Comments difficulty with  alternating and divided attention with cooking    Memory Decreased short-term memory    Memory Comments pt reports "memory is a concern" with pt endorsing she has forgotten to turn off stove/water, forgot what she goes into a room for    Problem Solving Slow processing;Decreased initiation;Difficulty sequencing      Posture/Postural Control   Posture/Postural Control Postural limitations    Postural Limitations Rounded Shoulders   shoulder elevation bilaterlly     Sensation   Additional Comments Pt reports RLE tingling and pins/needles in both feet (recent change) and reports jerking--all intermittently.  top of feet tender to touch      Coordination   9 Hole Peg Test Left;Right    Right 9 Hole Peg Test 24.35    Left 9 Hole Peg Test 30.69    Coordination Pt reports occasional "jerking" of L hand.  (per notes, some jerking of RUE in hospital)      Perception   Perception Impaired    Inattention/Neglect Does not attend to left visual field   per pt, bumps into items on L side     ROM / Strength   AROM / PROM / Strength AROM;Strength      AROM   Overall AROM  Within functional limits for tasks performed   BUEs     Strength   Overall Strength Deficits    Overall Strength Comments BUE proximal strength grossly 5/5 except L horizontal abduction and flexion 4+/5      Hand Function   Right Hand Grip (lbs) 42.5    Left Hand Grip (lbs) 39.4                              OT Education - 05/29/21 1220     Education Details OT eval results/POC    Person(s) Educated Patient;Spouse    Methods Explanation    Comprehension Verbalized understanding  OT Short Term Goals - 05/29/21 1518       OT SHORT TERM GOAL #1   Title Pt will verbalize understanding of visual compensation strategies for ADLs/IADLs for incr ease/safety.--check STGs 06/28/21    Time 4    Period Weeks    Status New      OT SHORT TERM GOAL #2   Title Pt will perform simple  environmental scanning/navigation with at least 90% accuracy and no LOB.    Time 4    Period Weeks    Status New      OT SHORT TERM GOAL #3   Title Pt will perform divided attention between at least 1 cognitive and 1 physical task with at least 75% accuracy.    Time 4    Period Weeks    Status New      OT SHORT TERM GOAL #4   Title Pt will be independent with LUE HEP for mild coordination/strength deficits.    Period Weeks    Status New               OT Long Term Goals - 05/29/21 1521       OT LONG TERM GOAL #1   Title Pt will verbalize understanding of memory/cognitive compensation strategies for ADLs/IADLs for incr ease/safety.--check LTGs 07/28/21    Time 8    Period Weeks    Status New      OT LONG TERM GOAL #2   Title Pt will perform environmental scanning and navigation in busy, dynamic environment with at least 95% accuracy.    Time 8    Period Weeks    Status New      OT LONG TERM GOAL #3   Title Pt will perform divided attention between at least 1 cognitive and 1 physical task with at least 90% accuracy.    Time 8    Period Weeks    Status New      OT LONG TERM GOAL #4   Title Pt will be independent with HEP for visual scanning/cognition.    Time 8    Period Weeks    Status New                   Plan - 05/29/21 1452     Clinical Impression Statement Pt is a 75 y.o. 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  PMH includes: lung cancer s/p R upper lobectomy, currently undergoing chemo, GERD, HLD, bradycardia.  Pt was independent prior to CVA.  Pt presents today with cognitive deficits, decr balance, and visual-perceptual deficits, and mild decr LUE coordination and strength.  Pt would benefit from occupational therapy to address these deficits for incr safety/ease with ADLs/IADLs.    OT Occupational Profile and History Detailed Assessment- Review of Records and  additional review of physical, cognitive, psychosocial history related to current functional performance    Occupational performance deficits (Please refer to evaluation for details): ADL's;IADL's;Leisure    Body Structure / Function / Physical Skills ADL;UE functional use;IADL;Balance;Dexterity;Coordination;FMC;Strength    Cognitive Skills Memory;Attention;Problem Solve;Safety Awareness;Sequencing    Rehab Potential Good    Clinical Decision Making Several treatment options, min-mod task modification necessary    Comorbidities Affecting Occupational Performance: May have comorbidities impacting occupational performance    Modification or Assistance to Complete Evaluation  Min-Moderate modification of tasks or assist with assess necessary to complete eval    OT Frequency  2x / week    OT Duration 8 weeks   +eval   OT Treatment/Interventions Self-care/ADL training;DME and/or AE instruction;Balance training;Therapeutic activities;Cognitive remediation/compensation;Therapeutic exercise;Neuromuscular education;Functional Mobility Training;Visual/perceptual remediation/compensation;Patient/family education    Plan visual scanning, visual compensation strategies/HEP for home    Consulted and Agree with Plan of Care Patient;Family member/caregiver    Family Member Consulted husband             Patient will benefit from skilled therapeutic intervention in order to improve the following deficits and impairments:   Body Structure / Function / Physical Skills: ADL, UE functional use, IADL, Balance, Dexterity, Coordination, FMC, Strength Cognitive Skills: Memory, Attention, Problem Solve, Safety Awareness, Sequencing     Visit Diagnosis: Other symptoms and signs 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  Attention and concentration deficit  Frontal lobe and executive function deficit  Visuospatial deficit  Unsteadiness on feet  Other lack of  coordination  Muscle weakness (generalized)    Problem List Patient Active Problem List   Diagnosis Date Noted   Swelling of both lower extremities 05/08/2021   Frequent headaches 03/25/2021   Sciatica of left side 03/25/2021   Chronic low back pain 03/25/2021   Anxiety disorder 03/25/2021   Intraparenchymal hematoma of brain ([place]) 02/27/2021   Stroke, 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[place]) 02/23/2021   Intracranial hemorrhage ([place]) 02/22/2021   Chemotherapy induced neutropenia ([place]) 01/08/2021   Bradycardia 01/01/2021   Hypomagnesemia 12/10/2020   Nausea without vomiting 12/10/2020   Status post thoracentesis    Pleural effusion, right 11/01/2020   Adenocarcinoma of right lung, stage 2 ([place]) 09/11/2020   Encounter for antineoplastic chemotherapy 09/11/2020   S/P Robotic Assited Video Thoracoscopy with Right Upper Lobectomy Lung 08/26/2020   Mediastinal adenopathy 07/18/2020   Lung nodules    Lung nodule 06/14/2020   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   Tobacco dependence 05/29/2020   Exertional chest pain 05/28/2020   Numbness and tingling of left arm and leg 07/01/2012   Hyperlipidemia with target low density lipoprotein (LDL) cholesterol less than 100 mg/dL 07/01/2012   Chest pressure 06/30/2012   GERD (gastroesophageal reflux disease) 06/30/2012    Vianne Bulls, OT/L 05/29/2021, 3:25 PM  [facility] [address] [place], [place], [address] Phone: [phone]   Fax:  [phone]  Name: Tonya [name] MRN: [id number] Date of Birth: Mar 05, 1946  Vianne Bulls, OTR/L [facility] [address] [place], [place]  [address] [phone] phone [phone] 05/29/21 3:25 PM '

## 2021-05-29 NOTE — Therapy (Signed)
[facility] [address] [place], [place], [address] Phone: [phone]   Fax:  [phone]  Physical Therapy Evaluation  Patient Details  Name: Tonya [name] MRN: [id number] Date of Birth: 11-04-1945 No data recorded  Encounter Date: 05/29/2021   PT End of Session - 05/29/21 1533     Visit Number 1    Number of Visits 13    Date for PT Re-Evaluation 07/13/21    Authorization Type [employer]    Progress Note Due on Visit 10    PT Start Time 1150    PT Stop Time 3546    PT Time Calculation (min) 45 min    Activity Tolerance Patient tolerated treatment well    Behavior During Therapy [facility] for tasks assessed/performed             Past Medical History:  Diagnosis Date   Adenoma of left adrenal gland    Cancer ([place])    Lung   Colon polyps    hyperplastic   Complication of anesthesia    Very emotional and cries after anesthesia   GERD (gastroesophageal reflux disease)    High blood pressure 03/25/2021   Kidney stone    Liver lesion    Microhematuria     Past Surgical History:  Procedure Laterality Date   ABDOMINAL HYSTERECTOMY     APPENDECTOMY     BRONCHIAL BIOPSY  07/18/2020   Procedure: BRONCHIAL BIOPSIES;  Surgeon: [name], DO;  Location: [facility];  Service: Pulmonary;;   BRONCHIAL BRUSHINGS  07/18/2020   Procedure: BRONCHIAL BRUSHINGS;  Surgeon: [name], DO;  Location: [facility];  Service: Pulmonary;;   BRONCHIAL NEEDLE ASPIRATION BIOPSY  07/18/2020   Procedure: BRONCHIAL NEEDLE ASPIRATION BIOPSIES;  Surgeon: [name], DO;  Location: [facility];  Service: Pulmonary;;   BRONCHIAL WASHINGS  07/18/2020   Procedure: BRONCHIAL WASHINGS;  Surgeon: [name], DO;  Location: [place] [facility];  Service: Pulmonary;;   FIDUCIAL MARKER PLACEMENT  07/18/2020   Procedure: FIDUCIAL MARKER PLACEMENT;  Surgeon: [name], DO;  Location: [place] [facility];   Service: Pulmonary;;   INTERCOSTAL NERVE BLOCK Right 08/26/2020   Procedure: INTERCOSTAL NERVE BLOCK;  Surgeon: [name], MD;  Location: [facility];  Service: Thoracic;  Laterality: Right;   IR THORACENTESIS ASP PLEURAL SPACE W/IMG GUIDE  10/10/2020   NODE DISSECTION Right 08/26/2020   Procedure: NODE DISSECTION;  Surgeon: [name], MD;  Location: [facility];  Service: Thoracic;  Laterality: Right;   THORACENTESIS Right 11/04/2020   Procedure: [place];  Surgeon: [name], DO;  Location: [facility];  Service: Pulmonary;  Laterality: Right;   THORACENTESIS Right 11/29/2020   Procedure: THORACENTESIS;  Surgeon: [name], DO;  Location: [facility];  Service: Pulmonary;  Laterality: Right;   VIDEO BRONCHOSCOPY WITH ENDOBRONCHIAL NAVIGATION N/A 07/18/2020   Procedure: VIDEO BRONCHOSCOPY WITH ENDOBRONCHIAL NAVIGATION;  Surgeon: [name], DO;  Location: [facility];  Service: Pulmonary;  Laterality: N/A;   VIDEO BRONCHOSCOPY WITH ENDOBRONCHIAL ULTRASOUND  07/18/2020   Procedure: VIDEO BRONCHOSCOPY WITH ENDOBRONCHIAL ULTRASOUND;  Surgeon: [name], DO;  Location: [facility];  Service: Pulmonary;;    There were no vitals filed for this visit.    Subjective Assessment - 05/29/21 1154     Subjective Pt had CVA on 02/23/21 with L sided weakness.  Notes continued weakness and decreased balance.  She does endorse bumping into things on the L due to visual deficits.  Patient is accompained by: Family member   husband [name]   Pertinent History HTN, lung cancer, GERD, HLD    Limitations Walking;House hold activities;Standing    How long can you stand comfortably? >20 mins    How long can you walk comfortably? 30 mins    Patient Stated Goals "I want to improve my balance"    Currently in Pain? 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  Yes    Is the patient reluctant to leave their home because of a fear of falling?  Yes      [place] Private residence    Living Arrangements Spouse/significant other    Available Help at Discharge Available 24 hours/day    Type of [place] to enter    Entrance Stairs-Number of Steps 1    Entrance Stairs-Rails None    Home Layout One level    Home Equipment Shower seat      Prior Function   Level of [place] Retired    Leisure travel, read, entertain, spend time with family      Cognition   Overall Cognitive Status Impaired/Different from baseline   see SLP notes   Area of Impairment Attention;Memory;Problem solving      Sensation   Light Touch Impaired Detail    Light Touch Impaired Details Impaired RLE;Impaired LLE   feet up to calf has intermittent N/T   Hot/Cold Appears Intact    Additional Comments Pt reports RLE tingling and pins/needles in both feet (recent change) and reports jerking--all intermittently.  top of feet tender to touch      Coordination   Gross Motor Movements are Fluid and Coordinated Yes    Fine Motor Movements are Fluid and Coordinated Yes      Posture/Postural Control   Posture/Postural Control Postural limitations    Postural Limitations Rounded  Shoulders    Posture Comments B shoulder elevation      ROM / Strength   AROM / PROM / Strength Strength      Strength   Overall Strength Deficits    Overall Strength Comments L hip flex 3+/5, L knee ext 4/5, L knee flex 3+/5, L ankle DF 4/5.  RLE WFL.      Transfers   Transfers Sit to Stand;Stand to Sit    Sit to Stand 5: Supervision  Five time sit to stand comments  17.62 secs without UE support from standard arm chair    Stand to Sit 5: Supervision      Ambulation/Gait   Ambulation/Gait Yes    Ambulation/Gait Assistance 5: Supervision;4: Min guard    Ambulation/Gait Assistance Details [place] during higher level balance challenges.  Note dec stride length, dec gait speed and narrow BOS during gait.  She did have a couple of instances of catching R or L foot.    Ambulation Distance (Feet) 200 Feet    Assistive device None    Gait Pattern Step-through pattern;Decreased stride length;Left foot flat;Trunk flexed;Narrow base of support;Poor foot clearance - left;Poor foot clearance - right    Ambulation Surface Level;Indoor    Gait velocity 15.97 secs= 2.05 ft/sec    Stairs Yes    Stairs Assistance 5: Supervision    Stair Management Technique Two rails;Alternating pattern;Forwards    Number of Stairs 4    Height of Stairs 6      Functional Gait  Assessment   Gait assessed  Yes    Gait Level Surface Walks 20 ft, slow speed, abnormal gait pattern, evidence for imbalance or deviates 10-15 in outside of the 12 in walkway width. Requires more than 7 sec to ambulate 20 ft.   8.78 secs   Change in Gait Speed Able to change speed, demonstrates mild gait deviations, deviates 6-10 in outside of the 12 in walkway width, or no gait deviations, unable to achieve a major change in velocity, or uses a change in velocity, or uses an assistive device.    Gait with Horizontal Head Turns Performs head turns smoothly with slight change in gait velocity (eg, minor disruption to smooth gait path),  deviates 6-10 in outside 12 in walkway width, or uses an assistive device.    Gait with Vertical Head Turns Performs task with slight change in gait velocity (eg, minor disruption to smooth gait path), deviates 6 - 10 in outside 12 in walkway width or uses assistive device    Gait and Pivot Turn Pivot turns safely in greater than 3 sec and stops with no loss of balance, or pivot turns safely within 3 sec and stops with mild imbalance, requires small steps to catch balance.    Step Over Obstacle Is able to step over one shoe box (4.5 in total height) but must slow down and adjust steps to clear box safely. May require verbal cueing.    Gait with Narrow Base of Support Ambulates 4-7 steps.    Gait with Eyes Closed Walks 20 ft, slow speed, abnormal gait pattern, evidence for imbalance, deviates 10-15 in outside 12 in walkway width. Requires more than 9 sec to ambulate 20 ft.    Ambulating Backwards Walks 20 ft, uses assistive device, slower speed, mild gait deviations, deviates 6-10 in outside 12 in walkway width.    Steps Alternating feet, must use rail.    Total Score 16    FGA comment: < 19 = high risk fall                        Objective measurements completed on examination: See above findings.                PT Education - 05/29/21 1533     Education Details Education on goals, POC, evaluation findings    Person(s) Educated Patient    Methods Explanation    Comprehension Verbalized understanding  PT Short Term Goals - 05/29/21 1543       PT SHORT TERM GOAL #1   Title Pt will be IND with initial HEP in order to indicate improved functional mobility and dec fall risk.  (Target Date:06/19/21)    Time 3    Period Weeks    Status New    Target Date 06/19/21      PT SHORT TERM GOAL #2   Title Pt will improve gait speed to >/=2.65 ft/sec in order to indicate safe community ambulation.    Time 3    Period Weeks    Status New      PT SHORT  TERM GOAL #3   Title Pt will improve 5TSS to </=14 secs without UE support in order to ind dec fall risk and improved functional strength.    Time 3    Period Weeks    Status New      PT SHORT TERM GOAL #4   Title Pt will improve FGA to >/=20/30  in order to indicate dec fall risk.    Time 3    Period Weeks    Status New      PT SHORT TERM GOAL #5   Title Will assess cognitive TUG and write appropriate LTG to reflect dec fall risk    Time 3    Period Weeks    Status New               PT Long Term Goals - 05/29/21 1547       PT LONG TERM GOAL #1   Title Pt will be IND with final HEP in order to indicate dec fall risk and improved functional mobility.  (Target Date: 07/13/21)    Time 6    Period Weeks    Status New    Target Date 07/13/21      PT LONG TERM GOAL #2   Title Pt will improve gait speed to >/=3.25 ft/sec in order to indicate dec fall risk and improved efficiency of gait.    Time 6    Period Weeks    Status New      PT LONG TERM GOAL #3   Title Pt will perform 5TSS in </=11.5 secs without UE support in order to ind dec fall risk and improved functional strength.    Time 6    Period Weeks    Status New      PT LONG TERM GOAL #4   Title Pt will improve FGA to >/=24/30 in order to indicate dec fall risk.    Time 6    Period Weeks    Status New      PT LONG TERM GOAL #5   Title Pt will ambulate >1000' over varying outdoor surfaces at mod I level demonstrating ability to scan L environment/avoid obstacles on L in order to indicate safe community mobility.    Time 6    Period Weeks    Status New                    Plan - 05/29/21 1534     Clinical Impression Statement Pt is pleasant 75 yo female with recent history of R parietal hemorrage on 02/23/21 with L sided weakness, decreased balance, L inattention, decreased activity tolerance.  She also reports new onset of B LE swelling and numbness tingling.  No history of DM, therefore educated to  mention to both rehab MD and neurology.  Pt and husand  verbalized understanding.  Note past history of lung cancer (unsure if getting current treatment), HTN, GERD, and HLD.  Upon PT evaluation, note gait speed of 2.05 ft/sec indicative of limited community ambulator, 5TSS time of 17.62 secs indicative of increased fall risk and decreased functional strength and FGA score of 16/30 indicative of high fall risk.  Feel that pt will do very well and will benefit from skilled OP neuro PT in order to address deficits.    Personal Factors and Comorbidities Comorbidity 3+    Comorbidities see above    Examination-Activity Limitations Locomotion Level;Squat;Stairs;Stand;Transfers    Examination-Participation Restrictions Community Activity;Driving;Yard Work    [employer] Evolving/Moderate complexity    Clinical Decision Making Moderate    Rehab Potential Good    PT Frequency 2x / week    PT Duration 6 weeks    PT Treatment/Interventions ADLs/Self Care Home Management;Aquatic Therapy;Gait training;Stair training;Functional mobility training;Therapeutic activities;Therapeutic exercise;Balance training;Neuromuscular re-education;Patient/family education;Passive range of motion;Vestibular;Visual/perceptual remediation/compensation    PT Next Visit Plan Cognitive TUG-set goal, initiate HEP for high level balance and strength (she may bring in her old HEP which she said was very lengthy, discussed that we would make shorter for improved compliance): focus on standing hip strength, hamstring strength, DF, high level balance on compliant surfaces, head motion, EC    Consulted and Agree with Plan of Care Patient;Family member/caregiver    Family Member Consulted husband walter             Patient will benefit from skilled therapeutic intervention in order to improve the following deficits and impairments:  Abnormal gait, Decreased activity tolerance, Decreased balance, Decreased cognition,  Decreased endurance, Decreased mobility, Decreased strength, Impaired perceived functional ability, Impaired sensation, Postural dysfunction  Visit Diagnosis: Unsteadiness on feet  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)  Other abnormalities of gait and mobility     Problem List Patient Active Problem List   Diagnosis Date Noted   Swelling of both lower extremities 05/08/2021   Frequent headaches 03/25/2021   Sciatica of left side 03/25/2021   Chronic low back pain 03/25/2021   Anxiety disorder 03/25/2021   Intraparenchymal hematoma of brain ([place]) 02/27/2021   Stroke, 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   Intracranial hemorrhage ([place]) 02/22/2021   Chemotherapy induced neutropenia ([place]) 01/08/2021   Bradycardia 01/01/2021   Hypomagnesemia 12/10/2020   Nausea without vomiting 12/10/2020   Status post thoracentesis    Pleural effusion, right 11/01/2020   Adenocarcinoma of right lung, stage 2 ([place]) 09/11/2020   Encounter for antineoplastic chemotherapy 09/11/2020   S/P Robotic Assited Video Thoracoscopy with Right Upper Lobectomy Lung 08/26/2020   Mediastinal adenopathy 07/18/2020   Lung nodules    Lung nodule 06/14/2020   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   Tobacco dependence 05/29/2020   Exertional chest pain 05/28/2020   Numbness and tingling of left arm and leg 07/01/2012   Hyperlipidemia with target low density lipoprotein (LDL) cholesterol less than 100 mg/dL 07/01/2012   Chest pressure 06/30/2012   GERD (gastroesophageal reflux disease) 06/30/2012    Cameron Sprang, PT, MPT [facility] [address] [place], [place], [address] Phone: [phone]   Fax:  [phone] 05/29/21, 3:54 PM   Name: Tonya [name] MRN: [id number] Date of Birth: 09/25/1945

## 2021-05-30 NOTE — Therapy (Signed)
[facility] [address] [place], [place], [address] Phone: [phone]   Fax:  [phone]  Speech Language Pathology Evaluation  Patient Details  Name: Tonya [name] MRN: [id number] Date of Birth: 1945-11-15 Referring Provider (SLP): Charlett Blake, MD   Encounter Date: 05/29/2021   End of Session - 05/30/21 0758     Visit Number 1    Number of Visits 17    Date for SLP Re-Evaluation 07/25/21    Authorization Type [employer]    SLP Start Time 1015    SLP Stop Time  1100    SLP Time Calculation (min) 45 min    Activity Tolerance Patient tolerated treatment well             Past Medical History:  Diagnosis Date   Adenoma of left adrenal gland    Cancer ([place])    Lung   Colon polyps    hyperplastic   Complication of anesthesia    Very emotional and cries after anesthesia   GERD (gastroesophageal reflux disease)    High blood pressure 03/25/2021   Kidney stone    Liver lesion    Microhematuria     Past Surgical History:  Procedure Laterality Date   ABDOMINAL HYSTERECTOMY     APPENDECTOMY     BRONCHIAL BIOPSY  07/18/2020   Procedure: BRONCHIAL BIOPSIES;  Surgeon: [name], DO;  Location: [facility];  Service: Pulmonary;;   BRONCHIAL BRUSHINGS  07/18/2020   Procedure: BRONCHIAL BRUSHINGS;  Surgeon: [name], DO;  Location: [facility];  Service: Pulmonary;;   BRONCHIAL NEEDLE ASPIRATION BIOPSY  07/18/2020   Procedure: BRONCHIAL NEEDLE ASPIRATION BIOPSIES;  Surgeon: [name], DO;  Location: [facility];  Service: Pulmonary;;   BRONCHIAL WASHINGS  07/18/2020   Procedure: BRONCHIAL WASHINGS;  Surgeon: [name], DO;  Location: [facility];  Service: Pulmonary;;   FIDUCIAL MARKER PLACEMENT  07/18/2020   Procedure: FIDUCIAL MARKER PLACEMENT;  Surgeon: [name], DO;  Location: [facility];  Service: Pulmonary;;   INTERCOSTAL NERVE BLOCK Right  08/26/2020   Procedure: INTERCOSTAL NERVE BLOCK;  Surgeon: [name], MD;  Location: [facility];  Service: Thoracic;  Laterality: Right;   IR THORACENTESIS ASP PLEURAL SPACE W/IMG GUIDE  10/10/2020   NODE DISSECTION Right 08/26/2020   Procedure: NODE DISSECTION;  Surgeon: [name], MD;  Location: [facility];  Service: Thoracic;  Laterality: Right;   THORACENTESIS Right 11/04/2020   Procedure: Mathews Robinsons;  Surgeon: [name], DO;  Location: [facility];  Service: Pulmonary;  Laterality: Right;   THORACENTESIS Right 11/29/2020   Procedure: THORACENTESIS;  Surgeon: [name], DO;  Location: [facility];  Service: Pulmonary;  Laterality: Right;   VIDEO BRONCHOSCOPY WITH ENDOBRONCHIAL NAVIGATION N/A 07/18/2020   Procedure: VIDEO BRONCHOSCOPY WITH ENDOBRONCHIAL NAVIGATION;  Surgeon: [name], DO;  Location: [facility];  Service: Pulmonary;  Laterality: N/A;   VIDEO BRONCHOSCOPY WITH ENDOBRONCHIAL ULTRASOUND  07/18/2020   Procedure: VIDEO BRONCHOSCOPY WITH ENDOBRONCHIAL ULTRASOUND;  Surgeon: [name], DO;  Location: [facility];  Service: Pulmonary;;    There were no vitals filed for this visit.       SLP Evaluation [facility] - 05/29/21 8242       SLP Visit Information   SLP Received On 04/18/21    Referring Provider (SLP) Charlett Blake, MD    Onset Date 02-27-21    Medical Diagnosis Stroke, hemorrhagic      Subjective  Subjective "going better but memory is still a concern"    Patient/Family Stated Goal "I would like to get back to where I was before the stroke or as close to it as possible. Regain more independence"      Pain Assessment   Pain Score 3     Pain Location Hip    Pain Orientation Right      General Information   HPI Tonya [name] is a 75 year old female with history of Lung cancer s/p RUL lobectomy with ongoing chemo and bradycardia in the past who was evaluated at OSH on 02/22/21 after presenting with reports of HA followed  by confusion. Per teleneurology report patient progressed to "stuporous state, was non verbal with right gaze preference and had left>right sided weakness" CT head showed large intraparenchymal hemorrhage within right parietal lobe as well as mild right frontal subarachnoid hemorrhage and patient had tremulousness with concerns of seizure activity.  UDS positive for benzos and THC.  She was intubated for airway protection and transferred to [facility] for management.  CT head showed IPH in high parietal lobe with subarachnoid extension over right convexity and basal cisterns.  Currently, the patient continues have cognitive deficits although she is no longer having visual illusions.  She is having problems with concentration.      Balance Screen   Has the patient fallen in the past 6 months Yes   2 months ago - "tilted" while reaching for a pan   How many times? 1    Has the patient had a decrease in activity level because of a fear of falling?  No    Is the patient reluctant to leave their home because of a fear of falling?  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     Cognition   Overall Cognitive Status Impaired/Different from baseline    Area of Impairment Attention;Memory;Problem solving    Current Attention Level Sustained    Attention Comments difficulty with alternating and divided attention while cooking    Memory Decreased short-term memory    Memory Comments pt reports "memory is a concern" with pt endorsing she has forgotten to turn off stove and forgotten to pay bills    Problem Solving Slow processing;Decreased initiation;Difficulty sequencing    Problem Solving Comments difficulty with problem solving exhibited during maze design      Auditory Comprehension    Overall Auditory Comprehension Appears within functional limits for tasks assessed      Expression   Primary Mode of Expression Verbal      Verbal Expression   Overall Verbal Expression Appears within functional limits for tasks assessed      Written Expression   Dominant Hand Right      Oral Motor/Sensory Function   Overall Oral Motor/Sensory Function Appears within functional limits for tasks assessed      Motor Speech   Overall Motor Speech Appears within functional limits for tasks assessed      Standardized Assessments   Standardized Assessments  Cognitive Linguistic Quick Test      Cognitive Linguistic Quick Test (Ages 18-69)   Attention WNL    Memory WNL    Executive Function Mild  Language WNL    Visuospatial Skills WNL    Severity Rating Total 19    Composite Severity Rating 15.8                             SLP Education - 05/29/21 1006     Education Details eval results, possible goals    Person(s) Educated Patient;Spouse    Methods Explanation;Demonstration    Comprehension Verbalized understanding;Returned demonstration;Need further instruction              SLP Short Term Goals - 05/30/21 0804       SLP SHORT TERM GOAL #1   Title Pt will use memory compensations for appointments, finances, and other daily activities with rare min A  over 2 sessions    Time 4    Period Weeks    Status New    Target Date 06/27/21      SLP SHORT TERM GOAL #2   Title Pt will use verbalize and demo accurate problem solving for functional scenarios encountered at home with rare min A over 2 sessions    Time 4    Period Weeks    Status New    Target Date 06/27/21      SLP SHORT TERM GOAL #3   Title Pt will ID and correct errors on structured cognitive tasks with 80% accuracy given rare min A over 2 sessions    Time 4    Period Weeks    Status New    Target Date 06/27/21      SLP SHORT TERM GOAL #4   Title Pt will implement strategy to  ensure stove is turned off after cooking for 3/3 opportunities    Time 4    Period Weeks    Status New    Target Date 06/27/21      SLP SHORT TERM GOAL #5   Title Pt will complete cognitive PROM in first few sessions    Time 4    Period Weeks    Status New    Target Date 06/27/21              SLP Long Term Goals - 05/30/21 0806       SLP LONG TERM GOAL #1   Title Pt will independently manage appointments and bill pay with use of compensations with no errors reported for 3/3 opportunities    Time 6    Period Weeks    Status New    Target Date 07/11/21      SLP LONG TERM GOAL #2   Title Pt will use independently demo accurate problem solving for functional scenarios encountered at home over 2 sessions    Time 8    Period Weeks    Status New    Target Date 07/25/21      SLP LONG TERM GOAL #3   Title Pt will report improved cognitive linguistic functioning on PROM by 5 points at last ST session    Time 8    Period Weeks    Status New    Target Date 07/25/21              Plan - 05/29/21 1007     Clinical Impression Statement "Tonya [name]" was referred for OPST to address cognition 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 Pt was accompanied by her husband, Tonya [name]. Pt previously received CIR and HH ST services to address cognition, but pt states "memory is  still a concern." Pt demonstrated good awareness of both physical and cognitive deficits. Pt reports she is utilizing some compensations at home (writing it down, using calendar, putting all needed items on counter) but she is still "forgetful." Recent episodes reported for forgetting to turn off stove multiple times after cooking, forgetting to pay a couple bills, and relying on husband to confirm appointments. Pt desires to return to PLOF and is motivated to address cognitive deficits. CLQT completed this session, which indicated mildly impaired executive functioning skills. Pt exhibited delayed processing time,  limited error awareness, and reduced problem solving. Pt denied dysphagia, dysarthria, and aphasia during eval. Given change in baseline cognition, SLP recommends skilled ST intervention to address cognitive linguistic skills to maximize return to PLOF and increase functional independence.    Speech Therapy Frequency 2x / week    Duration 8 weeks    Treatment/Interventions Patient/family education;Functional tasks;Cognitive reorganization;Compensatory techniques;Internal/external aids;SLP instruction and feedback;Environmental controls;Cueing hierarchy;Compensatory strategies    Potential to Achieve Goals Good    Consulted and Agree with Plan of Care Patient;Family member/caregiver             Patient will benefit from skilled therapeutic intervention in order to improve the following deficits and impairments:   Cognitive communication deficit    Problem List Patient Active Problem List   Diagnosis Date Noted   Swelling of both lower extremities 05/08/2021   Frequent headaches 03/25/2021   Sciatica of left side 03/25/2021   Chronic low back pain 03/25/2021   Anxiety disorder 03/25/2021   Intraparenchymal hematoma of brain ([place]) 02/27/2021   Stroke, 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[place]) 02/23/2021   Intracranial hemorrhage ([place]) 02/22/2021   Chemotherapy induced neutropenia ([place]) 01/08/2021   Bradycardia 01/01/2021   Hypomagnesemia 12/10/2020   Nausea without vomiting 12/10/2020   Status post thoracentesis    Pleural effusion, right 11/01/2020   Adenocarcinoma of right lung, stage 2 ([place]) 09/11/2020   Encounter for antineoplastic chemotherapy 09/11/2020   S/P Robotic Assited Video Thoracoscopy with Right Upper Lobectomy Lung 08/26/2020   Mediastinal adenopathy 07/18/2020   Lung nodules    Lung nodule 06/14/2020   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   Tobacco dependence 05/29/2020   Exertional chest pain 05/28/2020   Numbness and tingling of left arm and leg 07/01/2012   Hyperlipidemia with target  low density lipoprotein (LDL) cholesterol less than 100 mg/dL 07/01/2012   Chest pressure 06/30/2012   GERD (gastroesophageal reflux disease) 06/30/2012    Alinda Deem, MA CCC-SLP 05/30/2021, 8:20 AM  [facility] [address] [place], [place], [address] Phone: [phone]   Fax:  [phone]  Name: RENETTE HSU MRN: [id number] Date of Birth: [birth year]

## 2021-06-02 ENCOUNTER — Ambulatory Visit: Payer: Medicare HMO | Admitting: Occupational Therapy

## 2021-06-02 ENCOUNTER — Other Ambulatory Visit: Payer: Self-pay

## 2021-06-02 DIAGNOSIS — R41840 Attention and concentration deficit: Secondary | ICD-10-CM

## 2021-06-02 DIAGNOSIS — R278 Other lack of coordination: Secondary | ICD-10-CM

## 2021-06-02 DIAGNOSIS — R41842 Visuospatial deficit: Secondary | ICD-10-CM

## 2021-06-02 DIAGNOSIS — I69154 Hemiplegia and hemiparesis following nontraumatic intracerebral hemorrhage affecting left non-dominant side: Secondary | ICD-10-CM

## 2021-06-02 DIAGNOSIS — R41844 Frontal lobe and executive function deficit: Secondary | ICD-10-CM

## 2021-06-02 DIAGNOSIS — I69118 Other symptoms and signs 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: Secondary | ICD-10-CM

## 2021-06-02 DIAGNOSIS — R2681 Unsteadiness on feet: Secondary | ICD-10-CM | POA: Diagnosis not present

## 2021-06-02 NOTE — Therapy (Signed)
[facility] [address] [place], [place], [address] Phone: [phone]   Fax:  [phone]  Occupational Therapy Treatment  Patient Details  Name: Tonya [name] MRN: [id number] Date of Birth: Jan 11, 1946 Referring Provider (OT): Dr. Alysia Penna   Encounter Date: 06/02/2021   OT End of Session - 06/02/21 1249     Visit Number 2    Number of Visits 17    Date for OT Re-Evaluation 07/28/21    Authorization Type [employer] Medicare; Out of pocket has been met in full, patient is covered 100%  Prior auth required--awaiting auth    Authorization - Visit Number 2    Authorization - Number of Visits 10    Progress Note Due on Visit 10    OT Start Time 1234    OT Stop Time 1312    OT Time Calculation (min) 38 min             Past Medical History:  Diagnosis Date   Adenoma of left adrenal gland    Cancer ([place])    Lung   Colon polyps    hyperplastic   Complication of anesthesia    Very emotional and cries after anesthesia   GERD (gastroesophageal reflux disease)    High blood pressure 03/25/2021   Kidney stone    Liver lesion    Microhematuria     Past Surgical History:  Procedure Laterality Date   ABDOMINAL HYSTERECTOMY     APPENDECTOMY     BRONCHIAL BIOPSY  07/18/2020   Procedure: BRONCHIAL BIOPSIES;  Surgeon: [name], DO;  Location: [facility];  Service: Pulmonary;;   BRONCHIAL BRUSHINGS  07/18/2020   Procedure: BRONCHIAL BRUSHINGS;  Surgeon: [name], DO;  Location: [facility];  Service: Pulmonary;;   BRONCHIAL NEEDLE ASPIRATION BIOPSY  07/18/2020   Procedure: BRONCHIAL NEEDLE ASPIRATION BIOPSIES;  Surgeon: [name], DO;  Location: [facility];  Service: Pulmonary;;   BRONCHIAL WASHINGS  07/18/2020   Procedure: BRONCHIAL WASHINGS;  Surgeon: [name], DO;  Location: [facility];  Service: Pulmonary;;   FIDUCIAL MARKER PLACEMENT  07/18/2020   Procedure:  FIDUCIAL MARKER PLACEMENT;  Surgeon: [name], DO;  Location: [facility];  Service: Pulmonary;;   INTERCOSTAL NERVE BLOCK Right 08/26/2020   Procedure: INTERCOSTAL NERVE BLOCK;  Surgeon: [name], MD;  Location: [facility];  Service: Thoracic;  Laterality: Right;   IR THORACENTESIS ASP PLEURAL SPACE W/IMG GUIDE  10/10/2020   NODE DISSECTION Right 08/26/2020   Procedure: NODE DISSECTION;  Surgeon: [name], MD;  Location: [facility];  Service: Thoracic;  Laterality: Right;   THORACENTESIS Right 11/04/2020   Procedure: Mathews Robinsons;  Surgeon: [name], DO;  Location: [facility];  Service: Pulmonary;  Laterality: Right;   THORACENTESIS Right 11/29/2020   Procedure: THORACENTESIS;  Surgeon: [name], DO;  Location: [facility];  Service: Pulmonary;  Laterality: Right;   VIDEO BRONCHOSCOPY WITH ENDOBRONCHIAL NAVIGATION N/A 07/18/2020   Procedure: VIDEO BRONCHOSCOPY WITH ENDOBRONCHIAL NAVIGATION;  Surgeon: [name], DO;  Location: [facility];  Service: Pulmonary;  Laterality: N/A;   VIDEO BRONCHOSCOPY WITH ENDOBRONCHIAL ULTRASOUND  07/18/2020   Procedure: VIDEO BRONCHOSCOPY WITH ENDOBRONCHIAL ULTRASOUND;  Surgeon: [name], DO;  Location: [facility];  Service: Pulmonary;;    There were no vitals filed for this visit.   Subjective Assessment - 06/02/21 1237     Subjective  reports chronic sciatic nerve pain    Pertinent History R  parietal lobe ICH and R frontal SAH.    PMH includes: lung cancer s/p R upper lobectomy, currently undergoing chemo, GERD, HLD, bradycardia    Limitations fall risk, L inattention    Patient Stated Goals be indendepent and return to driving    Currently in Pain? Yes    Pain Score 4     Pain Location Hip    Pain Orientation Right    Pain Descriptors / Indicators Aching    Pain Onset More than a month ago    Pain Frequency Constant    Aggravating Factors  sitting    Pain Relieving Factors pain meds                         Treatment: Number cancellation worksheet, 10M with only 2 errors, min-mod v.c to initiate task and use line guide, mark along left margin to assist. Environmental scanning task 14/15 item  Copying small peg design with LUE min-mod difficulty/ v.c and increased time required          OT Education - 06/02/21 1248     Education Details visual compensation strategies    Person(s) Educated Patient;Spouse    Methods Explanation;Demonstration;Verbal cues;Handout    Comprehension Verbalized understanding              OT Short Term Goals - 06/02/21 1239       OT SHORT TERM GOAL #1   Title Pt will verbalize understanding of visual compensation strategies for ADLs/IADLs for incr ease/safety.--check STGs 06/28/21    Time 4    Period Weeks    Status New      OT SHORT TERM GOAL #2   Title Pt will perform simple environmental scanning/navigation with at least 90% accuracy and no LOB.    Time 4    Period Weeks    Status New      OT SHORT TERM GOAL #3   Title Pt will perform divided attention between at least 1 cognitive and 1 physical task with at least 75% accuracy.    Time 4    Period Weeks    Status New      OT SHORT TERM GOAL #4   Title Pt will be independent with LUE HEP for mild coordination/strength deficits.    Period Weeks    Status New               OT Long Term Goals - 05/29/21 1521       OT LONG TERM GOAL #1   Title Pt will verbalize understanding of memory/cognitive compensation strategies for ADLs/IADLs for incr ease/safety.--check LTGs 07/28/21    Time 8    Period Weeks    Status New      OT LONG TERM GOAL #2   Title Pt will perform environmental scanning and navigation in busy, dynamic environment with at least 95% accuracy.    Time 8    Period Weeks    Status New      OT LONG TERM GOAL #3   Title Pt will perform divided attention between at least 1 cognitive and 1 physical task with at least 90% accuracy.     Time 8    Period Weeks    Status New      OT LONG TERM GOAL #4   Title Pt will be independent with HEP for visual scanning/cognition.    Time 8    Period Weeks    Status New  Plan - 06/02/21 1312     Clinical Impression Statement Pt is progressing towards goals. Pt and husband verbalize understanding of compensatory strategies for visual deficits.    OT Occupational Profile and History Detailed Assessment- Review of Records and additional review of physical, cognitive, psychosocial history related to current functional performance    Occupational performance deficits (Please refer to evaluation for details): ADL's;IADL's;Leisure    Body Structure / Function / Physical Skills ADL;UE functional use;IADL;Balance;Dexterity;Coordination;FMC;Strength    Cognitive Skills Memory;Attention;Problem Solve;Safety Awareness;Sequencing    Rehab Potential Good    Clinical Decision Making Several treatment options, min-mod task modification necessary    Comorbidities Affecting Occupational Performance: May have comorbidities impacting occupational performance    Modification or Assistance to Complete Evaluation  Min-Moderate modification of tasks or assist with assess necessary to complete eval    OT Frequency 2x / week    OT Duration 8 weeks   +eval   OT Treatment/Interventions Self-care/ADL training;DME and/or AE instruction;Balance training;Therapeutic activities;Cognitive remediation/compensation;Therapeutic exercise;Neuromuscular education;Functional Mobility Training;Visual/perceptual remediation/compensation;Patient/family education    Plan continue to work towards goals    Consulted and Agree with Plan of Care Patient;Family member/caregiver    Family Member Consulted husband             Patient will benefit from skilled therapeutic intervention in order to improve the following deficits and impairments:   Body Structure / Function / Physical Skills: ADL, UE  functional use, IADL, Balance, Dexterity, Coordination, FMC, Strength Cognitive Skills: Memory, Attention, Problem Solve, Safety Awareness, Sequencing     Visit Diagnosis: Attention and concentration deficit  Frontal lobe and executive function deficit  Visuospatial deficit  Other lack of coordination  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)  Other symptoms and signs 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    Problem List Patient Active Problem List   Diagnosis Date Noted   Swelling of both lower extremities 05/08/2021   Frequent headaches 03/25/2021   Sciatica of left side 03/25/2021   Chronic low back pain 03/25/2021   Anxiety disorder 03/25/2021   Intraparenchymal hematoma of brain ([place]) 02/27/2021   Stroke, 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[place]) 02/23/2021   Intracranial hemorrhage ([place]) 02/22/2021   Chemotherapy induced neutropenia ([place]) 01/08/2021   Bradycardia 01/01/2021   Hypomagnesemia 12/10/2020   Nausea without vomiting 12/10/2020   Status post thoracentesis    Pleural effusion, right 11/01/2020   Adenocarcinoma of right lung, stage 2 ([place]) 09/11/2020   Encounter for antineoplastic chemotherapy 09/11/2020   S/P Robotic Assited Video Thoracoscopy with Right Upper Lobectomy Lung 08/26/2020   Mediastinal adenopathy 07/18/2020   Lung nodules    Lung nodule 06/14/2020   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   Tobacco dependence 05/29/2020   Exertional chest pain 05/28/2020   Numbness and tingling of left arm and leg 07/01/2012   Hyperlipidemia with target low density lipoprotein (LDL) cholesterol less than 100 mg/dL 07/01/2012   Chest pressure 06/30/2012   GERD (gastroesophageal reflux disease) 06/30/2012    Jeyren Danowski, OT/L 06/02/2021, 2:25 PM  [facility] [address] [place], [place], [address] Phone:  [phone]   Fax:  [phone]  Name: Tonya [name] MRN: [id number] Date of Birth: Sep 09, 1945

## 2021-06-02 NOTE — Patient Instructions (Signed)
1. Look for the edge of objects (to the left and/or right) so that you make sure you are seeing all of an object 2. Turn your head when walking, scan from side to side, particularly in busy environments 3. Use an organized scanning pattern. It's usually easier to scan from top to bottom, and left to right (like you are reading) 4. Double check yourself 5. Use a line guide (like a blank piece of paper) or your finger when reading 6. If necessary, place brightly colored tape at end of table or work area as a reminder to always look until you see the tape.

## 2021-06-05 ENCOUNTER — Other Ambulatory Visit: Payer: Self-pay

## 2021-06-05 ENCOUNTER — Ambulatory Visit: Payer: Medicare HMO | Admitting: Occupational Therapy

## 2021-06-05 DIAGNOSIS — I69118 Other symptoms and signs 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: Secondary | ICD-10-CM

## 2021-06-05 DIAGNOSIS — I69154 Hemiplegia and hemiparesis following nontraumatic intracerebral hemorrhage affecting left non-dominant side: Secondary | ICD-10-CM

## 2021-06-05 DIAGNOSIS — R41842 Visuospatial deficit: Secondary | ICD-10-CM

## 2021-06-05 DIAGNOSIS — R278 Other lack of coordination: Secondary | ICD-10-CM

## 2021-06-05 DIAGNOSIS — R41844 Frontal lobe and executive function deficit: Secondary | ICD-10-CM

## 2021-06-05 DIAGNOSIS — R2681 Unsteadiness on feet: Secondary | ICD-10-CM | POA: Diagnosis not present

## 2021-06-05 DIAGNOSIS — R41840 Attention and concentration deficit: Secondary | ICD-10-CM

## 2021-06-05 NOTE — Therapy (Signed)
[facility] [address] [place], [place], [address] Phone: [phone]   Fax:  [phone]  Occupational Therapy Treatment  Patient Details  Name: Tonya [name] MRN: [id number] Date of Birth: March 21, 1946 Referring Provider (OT): Dr. Alysia Penna   Encounter Date: 06/05/2021   OT End of Session - 06/05/21 1310     Visit Number 3    Number of Visits 17    Date for OT Re-Evaluation 07/28/21    Authorization Type [employer]; Out of pocket has been met in full, patient is covered 100%  Prior auth required--awaiting auth    Authorization - Visit Number 3    Authorization - Number of Visits 10    Progress Note Due on Visit 10    OT Start Time 1105    OT Stop Time 1145    OT Time Calculation (min) 40 min    Activity Tolerance Patient tolerated treatment well    Behavior During Therapy WFL for tasks assessed/performed             Past Medical History:  Diagnosis Date   Adenoma of left adrenal gland    Cancer ([place])    Lung   Colon polyps    hyperplastic   Complication of anesthesia    Very emotional and cries after anesthesia   GERD (gastroesophageal reflux disease)    High blood pressure 03/25/2021   Kidney stone    Liver lesion    Microhematuria     Past Surgical History:  Procedure Laterality Date   ABDOMINAL HYSTERECTOMY     APPENDECTOMY     BRONCHIAL BIOPSY  07/18/2020   Procedure: BRONCHIAL BIOPSIES;  Surgeon: [name], DO;  Location: [facility];  Service: Pulmonary;;   BRONCHIAL BRUSHINGS  07/18/2020   Procedure: BRONCHIAL BRUSHINGS;  Surgeon: [name], DO;  Location: [facility];  Service: Pulmonary;;   BRONCHIAL NEEDLE ASPIRATION BIOPSY  07/18/2020   Procedure: BRONCHIAL NEEDLE ASPIRATION BIOPSIES;  Surgeon: [name], DO;  Location: [facility];  Service: Pulmonary;;   BRONCHIAL WASHINGS  07/18/2020   Procedure: BRONCHIAL WASHINGS;  Surgeon: [name], DO;  Location: [facility];  Service: Pulmonary;;   FIDUCIAL MARKER PLACEMENT  07/18/2020   Procedure: FIDUCIAL MARKER PLACEMENT;  Surgeon: [name], DO;  Location: [facility];  Service: Pulmonary;;   INTERCOSTAL NERVE BLOCK Right 08/26/2020   Procedure: INTERCOSTAL NERVE BLOCK;  Surgeon: [name], MD;  Location: [facility];  Service: Thoracic;  Laterality: Right;   IR THORACENTESIS ASP PLEURAL SPACE W/IMG GUIDE  10/10/2020   NODE DISSECTION Right 08/26/2020   Procedure: NODE DISSECTION;  Surgeon: [name], MD;  Location: [facility];  Service: Thoracic;  Laterality: Right;   THORACENTESIS Right 11/04/2020   Procedure: Mathews Robinsons;  Surgeon: [name], DO;  Location: [facility];  Service: Pulmonary;  Laterality: Right;   THORACENTESIS Right 11/29/2020   Procedure: THORACENTESIS;  Surgeon: [name], DO;  Location: [facility];  Service: Pulmonary;  Laterality: Right;   VIDEO BRONCHOSCOPY WITH ENDOBRONCHIAL NAVIGATION N/A 07/18/2020   Procedure: VIDEO BRONCHOSCOPY WITH ENDOBRONCHIAL NAVIGATION;  Surgeon: [name], DO;  Location: [facility];  Service: Pulmonary;  Laterality: N/A;   VIDEO BRONCHOSCOPY WITH ENDOBRONCHIAL ULTRASOUND  07/18/2020   Procedure: VIDEO BRONCHOSCOPY WITH ENDOBRONCHIAL ULTRASOUND;  Surgeon: [name], DO;  Location: [facility];  Service: Pulmonary;;    There were no vitals filed for this visit.   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    PMH includes: lung cancer s/p R upper lobectomy, currently undergoing chemo, GERD, HLD, bradycardia    Limitations fall risk, L inattention    Patient Stated Goals be indendepent and return to driving    Currently in Pain? Yes    Pain Score 3     Pain Location Hip    Pain Orientation Right    Pain Descriptors / Indicators Aching    Pain Type Chronic pain    Pain Onset  More than a month ago    Pain Frequency Intermittent    Aggravating Factors  sitting    Pain Relieving Factors pain meds                        Treatment: Pt completed a 12 piece puzzle with mod v.c for organization, and problem solving.          OT Education - 06/05/21 1308     Education Details HEP for red putty, coordination for LUE as weel as precautions for LUE use as pt reports overshhoting when reaching for tiems with LUE( it may be related to vision, coordination and possible motor planning).    Person(s) Educated Patient;Spouse    Methods Explanation;Demonstration;Verbal cues;Handout    Comprehension Verbalized understanding;Returned demonstration;Verbal cues required              OT Short Term Goals - 06/02/21 1239       OT SHORT TERM GOAL #1   Title Pt will verbalize understanding of visual compensation strategies for ADLs/IADLs for incr ease/safety.--check STGs 06/28/21    Time 4    Period Weeks    Status New      OT SHORT TERM GOAL #2   Title Pt will perform simple environmental scanning/navigation with at least 90% accuracy and no LOB.    Time 4    Period Weeks    Status New      OT SHORT TERM GOAL #3   Title Pt will perform divided attention between at least 1 cognitive and 1 physical task with at least 75% accuracy.    Time 4    Period Weeks    Status New      OT SHORT TERM GOAL #4   Title Pt will be independent with LUE HEP for mild coordination/strength deficits.    Period Weeks    Status New               OT Long Term Goals - 05/29/21 1521       OT LONG TERM GOAL #1   Title Pt will verbalize understanding of memory/cognitive compensation strategies for ADLs/IADLs for incr ease/safety.--check LTGs 07/28/21    Time 8    Period Weeks    Status New      OT LONG TERM GOAL #2   Title Pt will perform environmental scanning and navigation in busy, dynamic environment with at least 95% accuracy.    Time 8    Period  Weeks    Status New      OT LONG TERM GOAL #3   Title Pt will perform divided attention between at least 1 cognitive and 1 physical task with at least 90% accuracy.    Time 8    Period Weeks    Status New      OT LONG  TERM GOAL #4   Title Pt will be independent with HEP for visual scanning/cognition.    Time 8    Period Weeks    Status New                   Plan - 06/05/21 1311     Clinical Impression Statement Pt is progressing towards goals. Pt demonstrates understanding of HEP for LUE coordination and strength.    OT Occupational Profile and History Detailed Assessment- Review of Records and additional review of physical, cognitive, psychosocial history related to current functional performance    Occupational performance deficits (Please refer to evaluation for details): ADL's;IADL's;Leisure    Body Structure / Function / Physical Skills ADL;UE functional use;IADL;Balance;Dexterity;Coordination;FMC;Strength    Cognitive Skills Memory;Attention;Problem Solve;Safety Awareness;Sequencing    Rehab Potential Good    Clinical Decision Making Several treatment options, min-mod task modification necessary    Comorbidities Affecting Occupational Performance: May have comorbidities impacting occupational performance    Modification or Assistance to Complete Evaluation  Min-Moderate modification of tasks or assist with assess necessary to complete eval    OT Frequency 2x / week    OT Duration 8 weeks   +eval   OT Treatment/Interventions Self-care/ADL training;DME and/or AE instruction;Balance training;Therapeutic activities;Cognitive remediation/compensation;Therapeutic exercise;Neuromuscular education;Functional Mobility Training;Visual/perceptual remediation/compensation;Patient/family education    Plan environmental scanning, alternating attention,    Consulted and Agree with Plan of Care Patient;Family member/caregiver    Family Member Consulted husband              Patient will benefit from skilled therapeutic intervention in order to improve the following deficits and impairments:   Body Structure / Function / Physical Skills: ADL, UE functional use, IADL, Balance, Dexterity, Coordination, FMC, Strength Cognitive Skills: Memory, Attention, Problem Solve, Safety Awareness, Sequencing     Visit Diagnosis: Attention and concentration deficit  Frontal lobe and executive function deficit  Visuospatial deficit  Other lack of coordination  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)  Other symptoms and signs 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    Problem List Patient Active Problem List   Diagnosis Date Noted   Swelling of both lower extremities 05/08/2021   Frequent headaches 03/25/2021   Sciatica of left side 03/25/2021   Chronic low back pain 03/25/2021   Anxiety disorder 03/25/2021   Intraparenchymal hematoma of brain ([place]) 02/27/2021   Stroke, 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[place]) 02/23/2021   Intracranial hemorrhage ([place]) 02/22/2021   Chemotherapy induced neutropenia ([place]) 01/08/2021   Bradycardia 01/01/2021   Hypomagnesemia 12/10/2020   Nausea without vomiting 12/10/2020   Status post thoracentesis    Pleural effusion, right 11/01/2020   Adenocarcinoma of right lung, stage 2 ([place]) 09/11/2020   Encounter for antineoplastic chemotherapy 09/11/2020   S/P Robotic Assited Video Thoracoscopy with Right Upper Lobectomy Lung 08/26/2020   Mediastinal adenopathy 07/18/2020   Lung nodules    Lung nodule 06/14/2020   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   Tobacco dependence 05/29/2020   Exertional chest pain 05/28/2020   Numbness and tingling of left arm and leg 07/01/2012   Hyperlipidemia with target low density lipoprotein (LDL) cholesterol less than 100 mg/dL 07/01/2012   Chest pressure 06/30/2012   GERD (gastroesophageal reflux disease)  06/30/2012    Mihaela Fajardo, OT/L 06/05/2021, 1:12 PM  [facility] [address] [place], [place], [address] Phone: [phone]   Fax:  [phone]  Name: BRANDI TOMLINSON MRN: [id number] Date of Birth: 01/25/1946

## 2021-06-05 NOTE — Patient Instructions (Addendum)
At home when you are reaching for something with your left hand slow down, watch your hand closely. It may help to turn your head slightly to the left. Use caution with anything breakable, hot, sharp or heavy. I would recommend using your right hand to pick those items up for safety.   1. Grip Strengthening (Resistive Putty)   Squeeze putty using thumb and all fingers. Repeat _20___ times. Do __2__ sessions per day.   2. Roll putty into tube on table and pinch between each finger and thumb x 10 reps each. (can do ring and small finger together)     Copyright  V[employer]. All rights reserved.         Coordination Activities  Perform the following activities for 20 minutes 1 times per day with left hand(s).Perform in seated, do not hike your shoulder.  Rotate ball in fingertips (clockwise and counter-clockwise). Toss ball in left hand Toss ball between hands. Flip cards 1 at a time as fast as you can. Deal cards with your thumb (Hold deck in hand and push card off top with thumb). Pick up coins and stack. Pick up coins one at a time until you get 5-10 in your hand, then move coins from palm to fingertips to place in container one at a time.

## 2021-06-11 ENCOUNTER — Ambulatory Visit: Payer: Medicare HMO | Admitting: Adult Health

## 2021-06-11 ENCOUNTER — Encounter: Payer: Self-pay | Admitting: Adult Health

## 2021-06-11 ENCOUNTER — Other Ambulatory Visit: Payer: Self-pay

## 2021-06-11 VITALS — BP 97/60 | HR 58 | Ht 64.0 in | Wt 139.0 lb

## 2021-06-11 DIAGNOSIS — S06310S Contusion and laceration of right cerebrum without loss of consciousness, sequela: Secondary | ICD-10-CM

## 2021-06-11 DIAGNOSIS — R41842 Visuospatial deficit: Secondary | ICD-10-CM

## 2021-06-11 DIAGNOSIS — H539 Unspecified visual disturbance: Secondary | ICD-10-CM

## 2021-06-11 DIAGNOSIS — E785 Hyperlipidemia, unspecified: Secondary | ICD-10-CM

## 2021-06-11 DIAGNOSIS — I69398 Other sequelae of cerebral infarction: Secondary | ICD-10-CM | POA: Diagnosis not present

## 2021-06-11 DIAGNOSIS — R269 Unspecified abnormalities of gait and mobility: Secondary | ICD-10-CM

## 2021-06-11 DIAGNOSIS — I69319 Unspecified symptoms and signs involving cognitive functions following cerebral infarction: Secondary | ICD-10-CM

## 2021-06-11 NOTE — Patient Instructions (Addendum)
You will be called to schedule an MRI of your brain to look for possible underlying causes of recent bleed  Continue working with therapies for likely ongoing recovery  Continue Crestor for secondary stroke prevention  Continue to follow up with PCP regarding cholesterol and blood pressure management  Maintain strict control of hypertension with blood pressure goal below 130/90 and cholesterol with LDL cholesterol (bad cholesterol) goal below 70 mg/dL.   You will be called to schedule a visit with Dr. Katy Fitch eye care      Followup in the future with me in 3 months or call earlier if needed       Thank you for coming to see [place] at [facility]. I hope we have been able to provide you high quality care today.  You may receive a patient satisfaction survey over the next few weeks. We would appreciate your feedback and comments so that we may continue to improve ourselves and the health of our patients.

## 2021-06-11 NOTE — Progress Notes (Signed)
[facility] [address] [place] [address] [phone]       HOSPITAL FOLLOW UP NOTE  [name] Date of Birth:  [birth year] Medical Record Number:  [id number]   Reason for Referral:  hospital stroke follow up    SUBJECTIVE:   CHIEF COMPLAINT:  Chief Complaint  Patient presents with   Follow-up    Rm 3 with spouse walter  Pt is well, has been having tenderness on R side, lightheaded when standing and  swelling of the feet.  Spouse states occasional confusion     HPI:   [name] is a 75 y.o. female w/pmh of GERD, history of bradycardia with ventricular bigeminy, HLD, Stage IIB (T3, N0, M0) non-small cell lung cancer, adenocarcinoma involving the right upper lobe who initially presented on OSH with AMS and on teleneuro exam, patient nonverbal, unable to follow commands, right gaze deviation and generalized but L>R weakness, found to have a right parietal parasaggital bleed for which she transferred to [facility] on 02/22/2021 for further work up and intervention.  Personally reviewed hospitalization pertinent progress notes, lab work and imaging.  Evaluated by Dr. Leonie Man for right parietal IPH with small frontal subarachnoid hemorrhage with etiology likely hypertensive in nature although recommended repeat MRI 2 to 3 months due to unclear exact etiology of bleed. MRV negative for CVST.  No evidence of vascular malformations or mets contributing to bleed.  No prior HTN history of elevated SBP during hospitalization and initiated lisinopril.  EF 60 to 65%.  LDL 101.  A1c 5.4.  Episode of RUE shaking with long-term EEG continuous generalized and right hemispheric slowing but no seizure activity - Keppra 500 mg twice daily initiated for seizure prophylaxis but eventually discontinued due to possible side effect of hallucinations.  Therapy evaluations recommended CIR for residual left-sided weakness, right gaze preference, balance  deficits high-level cognitive deficits and continued hallucinations   Today, 06/11/2021, Mrs. Tonya [name] is being seen for hospital follow-up accompanied by her husband, [name].   Cognitive concerns - short term memory - at times delayed recall. Able to maintain ADLs independently and has been slowly returning back to IADLs such as cooking and cleaning.  She questions return to driving Imbalance - stumbling.  Gradually improving.  Ambulates without assistive device Left side visual impairment - bumping into things on left side.  Perceptual difficulties.  Question seeing an ophthalmologist Depression/anxiety -struggling with adjusting to her deficits as she was completely independent prior.  Denies prior history of depression but currently taking Xanax typically only prior to bedtime Denies residual headache - has since discontinued topiramate  Completed home health therapy and started outpatient therapies on 9/22  Blood pressure today 97/60 - does monitor at home and typically 120s-130s/60s.         PERTINENT IMAGING    02/23/21 CT Head WO IV Contrast 1. Intraparenchymal hematoma centered in the high right parietal lobe with subarachnoid extension over the right convexity and into the basal cisterns. 2. No midline shift or other mass effect.   02/23/21 MR Brain W WO Contrast  Stable appearance of right parietal hemorrhage without evidence of underlying mass lesion or vascular malformation.  Stable small subdural and subarachnoid hemorrhage.  Small amount of blood within the ventricles but no hydrocephalus.   02/23/21 MR Angio Head Neck  WO Contrast  Normal variant MRA of circle of Willis without large vessel stenosis or occlusion or aneurysm.   02/23/21 MR Venogram Head  Normal.  No  evidence of venous sinus thrombosis   EEG 6/19 to 6/20  No evidence of seizures      ROS:   14 system review of systems performed and negative with exception of those listed in HPI  PMH:  Past  Medical History:  Diagnosis Date   Adenoma of left adrenal gland    Cancer ([place])    Lung   Colon polyps    hyperplastic   Complication of anesthesia    Very emotional and cries after anesthesia   GERD (gastroesophageal reflux disease)    High blood pressure 03/25/2021   Kidney stone    Liver lesion    Microhematuria     PSH:  Past Surgical History:  Procedure Laterality Date   ABDOMINAL HYSTERECTOMY     APPENDECTOMY     BRONCHIAL BIOPSY  07/18/2020   Procedure: BRONCHIAL BIOPSIES;  Surgeon: [name], DO;  Location: [place] [facility];  Service: Pulmonary;;   BRONCHIAL BRUSHINGS  07/18/2020   Procedure: BRONCHIAL BRUSHINGS;  Surgeon: [name], DO;  Location: [place] [facility];  Service: Pulmonary;;   BRONCHIAL NEEDLE ASPIRATION BIOPSY  07/18/2020   Procedure: BRONCHIAL NEEDLE ASPIRATION BIOPSIES;  Surgeon: [name], DO;  Location: [place] [facility];  Service: Pulmonary;;   BRONCHIAL WASHINGS  07/18/2020   Procedure: BRONCHIAL WASHINGS;  Surgeon: [name], DO;  Location: [place] [facility];  Service: Pulmonary;;   FIDUCIAL MARKER PLACEMENT  07/18/2020   Procedure: FIDUCIAL MARKER PLACEMENT;  Surgeon: [name], DO;  Location: [place] [facility];  Service: Pulmonary;;   INTERCOSTAL NERVE BLOCK Right 08/26/2020   Procedure: INTERCOSTAL NERVE BLOCK;  Surgeon: [name], MD;  Location: [place];  Service: Thoracic;  Laterality: Right;   IR THORACENTESIS ASP PLEURAL SPACE W/IMG GUIDE  10/10/2020   NODE DISSECTION Right 08/26/2020   Procedure: NODE DISSECTION;  Surgeon: [name], MD;  Location: [place];  Service: Thoracic;  Laterality: Right;   THORACENTESIS Right 11/04/2020   Procedure: Mathews Robinsons;  Surgeon: [name], DO;  Location: [place] [facility];  Service: Pulmonary;  Laterality: Right;   THORACENTESIS Right 11/29/2020   Procedure: THORACENTESIS;  Surgeon: [name], DO;  Location: [facility];  Service: Pulmonary;  Laterality: Right;    VIDEO BRONCHOSCOPY WITH ENDOBRONCHIAL NAVIGATION N/A 07/18/2020   Procedure: VIDEO BRONCHOSCOPY WITH ENDOBRONCHIAL NAVIGATION;  Surgeon: [name], DO;  Location: [place];  Service: Pulmonary;  Laterality: N/A;   VIDEO BRONCHOSCOPY WITH ENDOBRONCHIAL ULTRASOUND  07/18/2020   Procedure: VIDEO BRONCHOSCOPY WITH ENDOBRONCHIAL ULTRASOUND;  Surgeon: [name], DO;  Location: [facility];  Service: Pulmonary;;    Social History:  Social History   Socioeconomic History   Marital status: Married    Spouse name: Not on file   Number of children: 3   Years of education: Not on file   Highest education level: Not on file  Occupational History   Not on file  Tobacco Use   Smoking status: Former    Packs/day: 0.30    Years: 60.00    Pack years: 18.00    Types: Cigarettes    Quit date: 06/11/2020    Years since quitting: 1.0   Smokeless tobacco: Never  Vaping Use   Vaping Use: Never used  Substance and Sexual Activity   Alcohol use: Yes    Comment: 0.5 drink per month   Drug use: No   Sexual activity: Not Currently    Birth control/protection: Surgical    Comment: Hysterectomy  Other Topics Concern  Not on file  Social History Narrative   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    Social Determinants of Health   Financial Resource Strain: Not on file  Food Insecurity: Not on file  Transportation Needs: Not on file  Physical Activity: Not on file  Stress: Not on file  Social Connections: Not on file  Intimate Partner Violence: Not on file    Family History:  Family History  Problem Relation Age of Onset   Arrhythmia Mother        s/p Pacer    Heart attack Father    Heart disease Sister    Colon cancer Neg Hx     Medications:   Current Outpatient Medications on File Prior to Visit  Medication Sig Dispense Refill   acetaminophen (TYLENOL) 325 MG tablet Take 1-2 tablets (325-650 mg total) by mouth every 4 (four) hours as needed for mild pain.     albuterol  (VENTOLIN HFA) 108 (90 Base) MCG/ACT inhaler Inhale 2 puffs into the lungs every 6 (six) hours as needed for wheezing or shortness of breath. 8 g 2   alendronate (FOSAMAX) 70 MG tablet Take 70 mg by mouth every Saturday.     ALPRAZolam (XANAX) 1 MG tablet Take 1 mg by mouth in the morning, at noon, and at bedtime.     Calcium Carbonate-Vitamin D (CALCIUM + D PO) Take 1 tablet by mouth 2 (two) times daily.     folic acid (FOLVITE) 1 MG tablet TAKE 1 TABLET(1 MG) BY MOUTH DAILY 30 tablet 4   lidocaine (LIDODERM) 5 % Place 3 patches onto the skin daily. Apply at 8 am and remove at 8 pm daily. (Patient taking differently: Place 3 patches onto the skin as needed. Apply at 8 am and remove at 8 pm daily.) 90 patch 0   magnesium oxide (MAG-OX) 400 (241.3 Mg) MG tablet Take 1 tablet (400 mg total) by mouth 2 (two) times daily. 60 tablet 1   Menthol-Methyl Salicylate (MUSCLE RUB) 10-15 % CREA Apply 1 application topically 2 (two) times daily.  0   omeprazole (PRILOSEC) 40 MG capsule Take 40 mg by mouth daily.     oxyCODONE-acetaminophen (PERCOCET/ROXICET) 5-325 MG tablet Take 1-2 tablets by mouth 2 (two) times daily as needed for severe pain (severe pain). 28 tablet 0   rosuvastatin (CRESTOR) 10 MG tablet Take 10 mg by mouth at bedtime.     senna-docusate (SENOKOT-S) 8.6-50 MG tablet Take 1 tablet by mouth at bedtime. (Patient taking differently: Take 1 tablet by mouth as needed.) 30 tablet 0   umeclidinium-vilanterol (ANORO ELLIPTA) 62.5-25 MCG/INH AEPB Inhale 1 puff into the lungs daily. (Patient taking differently: Inhale 1 puff into the lungs as needed.) 60 each 0   No current facility-administered medications on file prior to visit.    Allergies:   Allergies  Allergen Reactions   Other Anaphylaxis    Spiders   Penicillins Hives    REACTION: 20 years   Tizanidine     Significant hypotension      OBJECTIVE:  Physical Exam  Vitals:   06/11/21 0906  BP: 97/60  Pulse: (!) 58  Weight: 139  lb (63 kg)  Height: 5\' 4"  (1.626 m)   Body mass index is 23.86 kg/m. No results found.  General: well developed, well nourished, very pleasant elderly African-American female, seated, in no evident distress Head: head normocephalic and atraumatic.   Neck: supple with no carotid or supraclavicular bruits Cardiovascular: regular rate and rhythm, no  murmurs Musculoskeletal: no deformity Skin:  no rash/petichiae Vascular:  Normal pulses all extremities   Neurologic Exam Mental Status: Awake and fully alert.  Fluent speech and language.  Oriented to place and time. Recent memory impaired and remote memory intact. Attention span, concentration and fund of knowledge appropriate during visit subjectively impaired.  Unable to further assess cognition during visit due to time constraints.  Mood and affect appropriate.  Cranial Nerves: Fundoscopic exam reveals sharp disc margins. Pupils equal, briskly reactive to light. Extraocular movements full without nystagmus. Visual fields full to confrontation (unable to appreciate peripheral visual impairment). Hearing intact. Facial sensation intact. Face, tongue, palate moves normally and symmetrically.  Motor: Normal bulk and tone. Normal strength in all tested extremity muscles except mild decreased left hand dexterity and mild left hip flexor weakness Sensory.: intact to touch , pinprick , position and vibratory sensation.  Coordination: Rapid alternating movements normal in all extremities except slightly decreased left hand. Finger-to-nose and heel-to-shin performed accurately bilaterally. Gait and Station: Arises from chair without difficulty. Stance is normal. Gait demonstrates decreased stride length and step height bilaterally with slow cautious gait without use of assistive device.  Tandem walk and heel toe without great difficulty.  Romberg negative. Reflexes: 1+ and symmetric. Toes downgoing.     NIHSS  0 Modified Rankin   2-3      ASSESSMENT: Tonya [name] is a 75 y.o. year old female with right parietal IPH with small frontal subarachnoid hemorrhage of indeterminate etiology on 02/22/2021 after presenting with AMS. Vascular risk factors include bradycardia likely in setting of ventricular bigeminy, stage IIB non-small cell lung cancer and adenocarcinoma RUL, and new dx of HTN.      PLAN:  Right IPH with SAH:  Residual deficit: Mild left hemiparesis, gait impairment with imbalance, cognitive impairment and visual spatial deficit.  Continue working with PT/OT/SLP for likely ongoing recovery.  Referral placed to Dr. Katy Fitch per pt request for continued visual concerns - possible residual left hemianopia unable to appreciate on confrontation testing.  Long discussion regarding typical recovery time.  Long discussion regarding adjustment difficulties with depression/anxiety and hopefully will improve as she continues to work with therapies and gradually returns back to her prior activities -she was advised to follow-up with PCP if symptoms persist.  Would not recommend return to driving at this time until further evaluation with ophthalmology and improvement of cognition Repeat MRI brain w/wo contrast to further assess for possible etiology.   Continue Crestor for secondary stroke prevention.   Discussed secondary stroke prevention measures and importance of close PCP follow up for aggressive stroke risk factor management. I have gone over the pathophysiology of stroke, warning signs and symptoms, risk factors and their management in some detail with instructions to go to the closest emergency room for symptoms of concern. HTN: BP goal <130/90.  New diagnosis during hospitalization.  Stable on nonpharmacological management per PCP HLD: LDL goal <70. Recent LDL 101.  Continue Crestor 10 mg daily managed by PCP     Follow up in 3 months or call earlier if needed   CC:  [place] provider: Dr. Leonie Man PCP:  Lin Landsman, MD    I spent 59 minutes of face-to-face and non-face-to-face time with patient and husband.  This included previsit chart review including review of recent hospitalization, lab review, study review, order entry, electronic health record documentation, patient education regarding recent stroke including potential etiology, secondary stroke prevention measures and importance of managing stroke risk factors, residual deficits  and typical recovery time and answered all other questions to patient and husband's satisfaction   Frann Rider, [facility]  [facility] [address] [place], [place] [address]  Phone [phone] Fax [phone] Note: This document was prepared with digital dictation and possible smart phrase technology. Any transcriptional errors that result from this process are unintentional.

## 2021-06-13 ENCOUNTER — Other Ambulatory Visit: Payer: Self-pay

## 2021-06-13 ENCOUNTER — Ambulatory Visit: Payer: Medicare HMO

## 2021-06-13 ENCOUNTER — Ambulatory Visit: Payer: Medicare HMO | Attending: Physical Medicine & Rehabilitation | Admitting: Physical Therapy

## 2021-06-13 ENCOUNTER — Ambulatory Visit: Payer: Medicare HMO | Admitting: Occupational Therapy

## 2021-06-13 DIAGNOSIS — R41840 Attention and concentration deficit: Secondary | ICD-10-CM | POA: Insufficient documentation

## 2021-06-13 DIAGNOSIS — R41841 Cognitive communication deficit: Secondary | ICD-10-CM | POA: Diagnosis present

## 2021-06-13 DIAGNOSIS — M6281 Muscle weakness (generalized): Secondary | ICD-10-CM | POA: Insufficient documentation

## 2021-06-13 DIAGNOSIS — R278 Other lack of coordination: Secondary | ICD-10-CM | POA: Insufficient documentation

## 2021-06-13 DIAGNOSIS — R2689 Other abnormalities of gait and mobility: Secondary | ICD-10-CM | POA: Diagnosis present

## 2021-06-13 DIAGNOSIS — R41844 Frontal lobe and executive function deficit: Secondary | ICD-10-CM | POA: Insufficient documentation

## 2021-06-13 DIAGNOSIS — I69118 Other symptoms and signs 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: Secondary | ICD-10-CM | POA: Diagnosis present

## 2021-06-13 DIAGNOSIS — R2681 Unsteadiness on feet: Secondary | ICD-10-CM | POA: Diagnosis not present

## 2021-06-13 DIAGNOSIS — R41842 Visuospatial deficit: Secondary | ICD-10-CM

## 2021-06-13 DIAGNOSIS — I69154 Hemiplegia and hemiparesis following nontraumatic intracerebral hemorrhage affecting left non-dominant side: Secondary | ICD-10-CM | POA: Diagnosis present

## 2021-06-13 NOTE — Therapy (Signed)
[facility] [address] [place], [place], [address] Phone: [phone]   Fax:  [phone]  Occupational Therapy Treatment  Patient Details  Name: Tonya [name] MRN: [id number] Date of Birth: [birth year] Referring Provider (OT): Dr. Alysia Penna   Encounter Date: 06/13/2021   OT End of Session - 06/13/21 0810     Visit Number 4    Number of Visits 17    Date for OT Re-Evaluation 07/28/21    Authorization Type [employer]; Out of pocket has been met in full, patient is covered 100%  Prior auth required--awaiting auth    Authorization - Visit Number 4    Authorization - Number of Visits 10    OT Start Time [phone]    OT Stop Time 0845    OT Time Calculation (min) 38 min             Past Medical History:  Diagnosis Date   Adenoma of left adrenal gland    Cancer ([place])    Lung   Colon polyps    hyperplastic   Complication of anesthesia    Very emotional and cries after anesthesia   GERD (gastroesophageal reflux disease)    High blood pressure 03/25/2021   Kidney stone    Liver lesion    Microhematuria     Past Surgical History:  Procedure Laterality Date   ABDOMINAL HYSTERECTOMY     APPENDECTOMY     BRONCHIAL BIOPSY  07/18/2020   Procedure: BRONCHIAL BIOPSIES;  Surgeon: [name], DO;  Location: [place] [facility];  Service: Pulmonary;;   BRONCHIAL BRUSHINGS  07/18/2020   Procedure: BRONCHIAL BRUSHINGS;  Surgeon: [name], DO;  Location: [place];  Service: Pulmonary;;   BRONCHIAL NEEDLE ASPIRATION BIOPSY  07/18/2020   Procedure: BRONCHIAL NEEDLE ASPIRATION BIOPSIES;  Surgeon: [name], DO;  Location: [place] [facility];  Service: Pulmonary;;   BRONCHIAL WASHINGS  07/18/2020   Procedure: BRONCHIAL WASHINGS;  Surgeon: [name], DO;  Location: [place] [facility];  Service: Pulmonary;;   FIDUCIAL MARKER PLACEMENT  07/18/2020   Procedure: FIDUCIAL MARKER PLACEMENT;   Surgeon: [name], DO;  Location: [place] [facility];  Service: Pulmonary;;   INTERCOSTAL NERVE BLOCK Right 08/26/2020   Procedure: INTERCOSTAL NERVE BLOCK;  Surgeon: [name], MD;  Location: [place];  Service: Thoracic;  Laterality: Right;   IR THORACENTESIS ASP PLEURAL SPACE W/IMG GUIDE  10/10/2020   NODE DISSECTION Right 08/26/2020   Procedure: NODE DISSECTION;  Surgeon: [name], MD;  Location: [place];  Service: Thoracic;  Laterality: Right;   THORACENTESIS Right 11/04/2020   Procedure: Mathews Robinsons;  Surgeon: [name], DO;  Location: [place] [facility];  Service: Pulmonary;  Laterality: Right;   THORACENTESIS Right 11/29/2020   Procedure: THORACENTESIS;  Surgeon: [name], DO;  [facility];  Service: Pulmonary;  Laterality: Right;   VIDEO BRONCHOSCOPY WITH ENDOBRONCHIAL NAVIGATION N/A 07/18/2020   Procedure: VIDEO BRONCHOSCOPY WITH ENDOBRONCHIAL NAVIGATION;  Surgeon: [name], DO;  Location: [place];  Service: Pulmonary;  Laterality: N/A;   VIDEO BRONCHOSCOPY WITH ENDOBRONCHIAL ULTRASOUND  07/18/2020   Procedure: VIDEO BRONCHOSCOPY WITH ENDOBRONCHIAL ULTRASOUND;  Surgeon: [name], DO;  Location: [place] [facility];  Service: Pulmonary;;    There were no vitals filed for this visit.                Treatment; Tabletop scanning task to match clock faces with time, increased time and min v.c for organized scan  pattern and locating items on left side. Environmental scanning task 2/13 items missed on first pass when locating in sequential order for cognitive component, increased time and min v.c for scanning left. Arm bike x 6 mins level 1 for conditioning.          OT Short Term Goals - 06/02/21 1239       OT SHORT TERM GOAL #1   Title Pt will verbalize understanding of visual compensation strategies for ADLs/IADLs for incr ease/safety.--check STGs 06/28/21    Time 4    Period Weeks    Status New      OT  SHORT TERM GOAL #2   Title Pt will perform simple environmental scanning/navigation with at least 90% accuracy and no LOB.    Time 4    Period Weeks    Status New      OT SHORT TERM GOAL #3   Title Pt will perform divided attention between at least 1 cognitive and 1 physical task with at least 75% accuracy.    Time 4    Period Weeks    Status New      OT SHORT TERM GOAL #4   Title Pt will be independent with LUE HEP for mild coordination/strength deficits.    Period Weeks    Status New               OT Long Term Goals - 05/29/21 1521       OT LONG TERM GOAL #1   Title Pt will verbalize understanding of memory/cognitive compensation strategies for ADLs/IADLs for incr ease/safety.--check LTGs 07/28/21    Time 8    Period Weeks    Status New      OT LONG TERM GOAL #2   Title Pt will perform environmental scanning and navigation in busy, dynamic environment with at least 95% accuracy.    Time 8    Period Weeks    Status New      OT LONG TERM GOAL #3   Title Pt will perform divided attention between at least 1 cognitive and 1 physical task with at least 90% accuracy.    Time 8    Period Weeks    Status New      OT LONG TERM GOAL #4   Title Pt will be independent with HEP for visual scanning/cognition.    Time 8    Period Weeks    Status New                   Plan - 06/13/21 0843     Clinical Impression Statement Pt is progressing towards goals, however she continues to demonstate memory deficits and L inattention.    OT Occupational Profile and History Detailed Assessment- Review of Records and additional review of physical, cognitive, psychosocial history related to current functional performance    Occupational performance deficits (Please refer to evaluation for details): ADL's;IADL's;Leisure    Body Structure / Function / Physical Skills ADL;UE functional use;IADL;Balance;Dexterity;Coordination;FMC;Strength    Cognitive Skills  Memory;Attention;Problem Solve;Safety Awareness;Sequencing    Rehab Potential Good    Clinical Decision Making Several treatment options, min-mod task modification necessary    Comorbidities Affecting Occupational Performance: May have comorbidities impacting occupational performance    Modification or Assistance to Complete Evaluation  Min-Moderate modification of tasks or assist with assess necessary to complete eval    OT Frequency 2x / week    OT Duration 8 weeks   +eval   OT Treatment/Interventions Self-care/ADL  training;DME and/or AE instruction;Balance training;Therapeutic activities;Cognitive remediation/compensation;Therapeutic exercise;Neuromuscular education;Functional Mobility Training;Visual/perceptual remediation/compensation;Patient/family education    Plan simple snack prep or cooking task with focus on scanning, reinforce visual compensations, alternating attention    Consulted and Agree with Plan of Care Patient;Family member/caregiver    Family Member Consulted husband             Patient will benefit from skilled therapeutic intervention in order to improve the following deficits and impairments:   Body Structure / Function / Physical Skills: ADL, UE functional use, IADL, Balance, Dexterity, Coordination, FMC, Strength Cognitive Skills: Memory, Attention, Problem Solve, Safety Awareness, Sequencing     Visit Diagnosis: Attention and concentration deficit  Frontal lobe and executive function deficit  Visuospatial deficit  Other lack of coordination  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)  Other symptoms and signs 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  Unsteadiness on feet    Problem List Patient Active Problem List   Diagnosis Date Noted   Swelling of both lower extremities 05/08/2021   Frequent headaches 03/25/2021   Sciatica of left side  03/25/2021   Chronic low back pain 03/25/2021   Anxiety disorder 03/25/2021   Intraparenchymal hematoma of brain 02/27/2021   Stroke, 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   Intracranial hemorrhage ([place]) 02/22/2021   Chemotherapy induced neutropenia ([place]) 01/08/2021   Bradycardia 01/01/2021   Hypomagnesemia 12/10/2020   Nausea without vomiting 12/10/2020   Status post thoracentesis    Pleural effusion, right 11/01/2020   Adenocarcinoma of right lung, stage 2 ([place]) 09/11/2020   Encounter for antineoplastic chemotherapy 09/11/2020   S/P Robotic Assited Video Thoracoscopy with Right Upper Lobectomy Lung 08/26/2020   Mediastinal adenopathy 07/18/2020   Lung nodules    Lung nodule 06/14/2020   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   Tobacco dependence 05/29/2020   Exertional chest pain 05/28/2020   Numbness and tingling of left arm and leg 07/01/2012   Hyperlipidemia with target low density lipoprotein (LDL) cholesterol less than 100 mg/dL 07/01/2012   Chest pressure 06/30/2012   GERD (gastroesophageal reflux disease) 06/30/2012    Fredda Clarida, OT/L 06/13/2021, 8:44 AM  [facility] [address] [place], [place], [address] Phone: [phone]   Fax:  [phone]  Name: PURVI RUEHL MRN: [id number] Date of Birth: Mar 06, 1946

## 2021-06-13 NOTE — Patient Instructions (Signed)
Memory Compensation Strategies  Use "WARM" strategy. W= write it down A=  associate it R=  repeat it M=  make a mental picture  You can keep a Memory Notebook. Use a 3-ring notebook with sections for the following:  calendar, important names and phone numbers, medications, doctors' names/phone numbers, "to do list"/reminders, and a section to journal what you did each day  Use a calendar to write appointments down.  Write yourself a schedule for the day.  This can be placed on the calendar or in a separate section of the Memory Notebook.  Keeping a regular schedule can help memory.  Use medication organizer with sections for each day or morning/evening pills  You may need help loading it  Keep a basket, or pegboard by the door.   Place items that you need to take out with you in the basket or on the pegboard.  You may also want to include a message board for reminders.  Use sticky notes. Place sticky notes with reminders in a place where the task is performed.  For example:  "turn off the stove" placed by the stove, "lock the door" placed on the door at eye level, "take your medications" on the bathroom mirror or by the place where you normally take your medications  Use alarms, timers, and/or a reminder app. Use while cooking to remind yourself to check on food or as a reminder to take your medicine, or as a reminder to make a call, or as a reminder to perform another task, etc.  Use a voice recorder app or small tape recorder to record important information and notes for yourself. Go back at the end of the day and listen to these.  Strategies for Improving Your Attention and Memory  Use good eye-contact Give the speaker your undivided attention Look directly at the speaker  Complete one task at a time Avoid multitasking Complete one task before starting a new one Write a note to yourself if you think of something else that needs to be done Let others know when you need quiet  time and can't be interrupted Don't answer the phone, texts, or emails while you are working on another task  Put aside distracting thoughts If you find your mind wandering, refocus your attention on the speaker Avoid off-topic comments or responses that may divert your attention  If something important comes to mind, let the speaker know and pause to write yourself a note: "Do you mind holding on a minute, I have to write something down." Put thoughts on hold and focus on salient information  Limit distractions in your environment Think about the environment around you Limit background noise by turning off the TV or music, putting your phone away Close the door and work in quiet  Use active listening Actively participate in the conversation to stay focused Paraphrase what you have heard to include the most important details Adding some associations may help you remember Ask questions to clarify certain points Summarize the speaker's comments periodically Avoid nodding your head and using "mhm" responses as these are more passive and don't help your attention  Alert the other person/people It may be helpful to alert your listener to the fact that you may need reminders to keep on track Tell the speaker in advance that you may need to stop them and have them repeat salient information If you lose focus, interject and let the person know, "I'm sorry, I lost you, can you tell me again?"  Write down information Write down pertinent information as it comes up, such as telephone numbers, names of people, addresses, details from appointments and conversations, etc.

## 2021-06-13 NOTE — Therapy (Signed)
[facility] [address] [place], [place], [address] Phone: [phone]   Fax:  [phone]  Speech Language Pathology Treatment  Patient Details  Name: Tonya [name] MRN: [id number] Date of Birth: 03-Mar-1946 Referring Provider (SLP): Charlett Blake, MD   Encounter Date: 06/13/2021   End of Session - 06/13/21 1129     Visit Number 2    Number of Visits 17    Date for SLP Re-Evaluation 07/25/21    Authorization Type [employer]    SLP Start Time 0848    SLP Stop Time  0934    SLP Time Calculation (min) 46 min    Activity Tolerance Patient tolerated treatment well             Past Medical History:  Diagnosis Date   Adenoma of left adrenal gland    Cancer ([place])    Lung   Colon polyps    hyperplastic   Complication of anesthesia    Very emotional and cries after anesthesia   GERD (gastroesophageal reflux disease)    High blood pressure 03/25/2021   Kidney stone    Liver lesion    Microhematuria     Past Surgical History:  Procedure Laterality Date   ABDOMINAL HYSTERECTOMY     APPENDECTOMY     BRONCHIAL BIOPSY  07/18/2020   Procedure: BRONCHIAL BIOPSIES;  Surgeon: [name], DO;  Location: [facility];  Service: Pulmonary;;   BRONCHIAL BRUSHINGS  07/18/2020   Procedure: BRONCHIAL BRUSHINGS;  Surgeon: [name], DO;  Location: [facility];  Service: Pulmonary;;   BRONCHIAL NEEDLE ASPIRATION BIOPSY  07/18/2020   Procedure: BRONCHIAL NEEDLE ASPIRATION BIOPSIES;  Surgeon: [name], DO;  Location: [facility];  Service: Pulmonary;;   BRONCHIAL WASHINGS  07/18/2020   Procedure: BRONCHIAL WASHINGS;  Surgeon: [name], DO;  Location: [facility];  Service: Pulmonary;;   FIDUCIAL MARKER PLACEMENT  07/18/2020   Procedure: FIDUCIAL MARKER PLACEMENT;  Surgeon: [name], DO;  Location: [facility];  Service: Pulmonary;;   INTERCOSTAL NERVE BLOCK Right  08/26/2020   Procedure: INTERCOSTAL NERVE BLOCK;  Surgeon: [name], MD;  Location: [facility];  Service: Thoracic;  Laterality: Right;   IR THORACENTESIS ASP PLEURAL SPACE W/IMG GUIDE  10/10/2020   NODE DISSECTION Right 08/26/2020   Procedure: NODE DISSECTION;  Surgeon: [name], MD;  Location: [facility];  Service: Thoracic;  Laterality: Right;   THORACENTESIS Right 11/04/2020   Procedure: Mathews Robinsons;  Surgeon: [name], DO;  Location: [facility];  Service: Pulmonary;  Laterality: Right;   THORACENTESIS Right 11/29/2020   Procedure: THORACENTESIS;  Surgeon: [name], DO;  Location: [facility];  Service: Pulmonary;  Laterality: Right;   VIDEO BRONCHOSCOPY WITH ENDOBRONCHIAL NAVIGATION N/A 07/18/2020   Procedure: VIDEO BRONCHOSCOPY WITH ENDOBRONCHIAL NAVIGATION;  Surgeon: [name], DO;  Location: [facility];  Service: Pulmonary;  Laterality: N/A;   VIDEO BRONCHOSCOPY WITH ENDOBRONCHIAL ULTRASOUND  07/18/2020   Procedure: VIDEO BRONCHOSCOPY WITH ENDOBRONCHIAL ULTRASOUND;  Surgeon: [name], DO;  Location: [facility];  Service: Pulmonary;;    There were no vitals filed for this visit.   Subjective Assessment - 06/13/21 0851     Subjective "I missed some things during the scavenger hunt" during recall of OT session    Currently in Pain? Yes    Pain Score 3     Pain Location Hip    Pain Orientation Right    Pain Descriptors /  Indicators Aching    Pain Type Chronic pain                   ADULT SLP TREATMENT - 06/13/21 0849       General Information   Behavior/Cognition Alert;Cooperative;Pleasant mood      Treatment Provided   Treatment provided Cognitive-Linquistic      Cognitive-Linquistic Treatment   Treatment focused on Cognition    Skilled Treatment SLP engaged patient and husband in discussion re: cognitive challenges observed, in which both reported episodes of losing items and pt reported difficulty multitasking.  SLP educated and instructed memory and attention compensations, in which pt identified need to complete one task at a time, use active listening, and reduce environmental distractions. SLP provided individualized instructions to repeat and mental picture items to aid recall. Pt's husband inquired about age versus stroke related deficits, in which pt advocated for self and indicated change in cognition since stroke. SLP recommended tracking any atypical behaviors to heighten pt's awareness as well as creation of personal goals as pt seems she is more dependent on her husband now. Pt would like to return to driving.      Assessment / Recommendations / Plan   Plan Continue with current plan of care      Progression Toward Goals   Progression toward goals Progressing toward goals              SLP Education - 06/13/21 0918     Education Details double check for errors, memory and attention compensations, functional tasks    Person(s) Educated Patient;Spouse    Methods Explanation;Demonstration;Handout;Verbal cues    Comprehension Verbalized understanding;Returned demonstration;Verbal cues required;Need further instruction              SLP Short Term Goals - 06/13/21 1131       SLP SHORT TERM GOAL #1   Title Pt will use memory compensations for appointments, finances, and other daily activities with rare min A  over 2 sessions    Time 4    Period Weeks    Status On-going    Target Date 06/27/21      SLP SHORT TERM GOAL #2   Title Pt will use verbalize and demo accurate problem solving for functional scenarios encountered at home with rare min A over 2 sessions    Time 4    Period Weeks    Status On-going    Target Date 06/27/21      SLP SHORT TERM GOAL #3   Title Pt will ID and correct errors on structured cognitive tasks with 80% accuracy given rare min A over 2 sessions    Time 4    Period Weeks    Status On-going    Target Date 06/27/21      SLP SHORT TERM GOAL #4    Title Pt will implement strategy to ensure stove is turned off after cooking for 3/3 opportunities    Time 4    Period Weeks    Status On-going    Target Date 06/27/21      SLP SHORT TERM GOAL #5   Title Pt will complete cognitive PROM in first few sessions    Time 4    Period Weeks    Status On-going    Target Date 06/27/21              SLP Long Term Goals - 06/13/21 1131       SLP LONG TERM GOAL #1  Title Pt will independently manage appointments and bill pay with use of compensations with no errors reported for 3/3 opportunities    Time 6    Period Weeks    Status On-going    Target Date 07/11/21      SLP LONG TERM GOAL #2   Title Pt will use independently demo accurate problem solving for functional scenarios encountered at home over 2 sessions    Time 8    Period Weeks    Status On-going    Target Date 07/25/21      SLP LONG TERM GOAL #3   Title Pt will report improved cognitive linguistic functioning on PROM by 5 points at last ST session    Time 8    Period Weeks    Status On-going    Target Date 07/25/21              Plan - 06/13/21 1129     Clinical Impression Statement "Annaliese" was referred for OPST to address cognition secondary to 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 Pt was accompanied by her husband, [name]. Recent episodes of forgetting items and increased dependence on husband for recall reported. Pt desires to increase her independence. SLP provided education and instruction of recommended memory and attention compensations to aid daily functioning at home, as pt reports she is having difficulty multitasking. Given change in baseline cognition, SLP recommends skilled ST intervention to address cognitive linguistic skills to maximize return to PLOF and increase functional independence.    Speech Therapy Frequency 2x / week    Duration 8 weeks    Treatment/Interventions Patient/family education;Functional tasks;Cognitive reorganization;Compensatory  techniques;Internal/external aids;SLP instruction and feedback;Environmental controls;Cueing hierarchy;Compensatory strategies    Potential to Achieve Goals Good    Consulted and Agree with Plan of Care Patient;Family member/caregiver             Patient will benefit from skilled therapeutic intervention in order to improve the following deficits and impairments:   Cognitive communication deficit    Problem List Patient Active Problem List   Diagnosis Date Noted   Swelling of both lower extremities 05/08/2021   Frequent headaches 03/25/2021   Sciatica of left side 03/25/2021   Chronic low back pain 03/25/2021   Anxiety disorder 03/25/2021   Intraparenchymal hematoma of brain 02/27/2021   Stroke, 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   Intracranial hemorrhage ([facility]) 02/22/2021   Chemotherapy induced neutropenia ([facility]) 01/08/2021   Bradycardia 01/01/2021   Hypomagnesemia 12/10/2020   Nausea without vomiting 12/10/2020   Status post thoracentesis    Pleural effusion, right 11/01/2020   Adenocarcinoma of right lung, stage 2 ([facility]) 09/11/2020   Encounter for antineoplastic chemotherapy 09/11/2020   S/P Robotic Assited Video Thoracoscopy with Right Upper Lobectomy Lung 08/26/2020   Mediastinal adenopathy 07/18/2020   Lung nodules    Lung nodule 06/14/2020   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   Tobacco dependence 05/29/2020   Exertional chest pain 05/28/2020   Numbness and tingling of left arm and leg 07/01/2012   Hyperlipidemia with target low density lipoprotein (LDL) cholesterol less than 100 mg/dL 07/01/2012   Chest pressure 06/30/2012   GERD (gastroesophageal reflux disease) 06/30/2012    Alinda Deem, MA CCC-SLP 06/13/2021, 11:33 AM  [facility] [address] [place], [place], [address] Phone: [phone]   Fax:  [phone]   Name: MIO SCHELLINGER MRN: [id number] Date of Birth: [birth year]

## 2021-06-16 ENCOUNTER — Ambulatory Visit: Payer: Medicare HMO | Admitting: Physical Therapy

## 2021-06-18 ENCOUNTER — Ambulatory Visit: Payer: Medicare HMO | Admitting: Rehabilitation

## 2021-06-18 ENCOUNTER — Encounter: Payer: Medicare HMO | Admitting: Speech Pathology

## 2021-06-19 ENCOUNTER — Encounter: Payer: Medicare HMO | Attending: Physical Medicine & Rehabilitation | Admitting: Physical Medicine & Rehabilitation

## 2021-06-19 ENCOUNTER — Encounter: Payer: Self-pay | Admitting: Physical Medicine & Rehabilitation

## 2021-06-19 ENCOUNTER — Other Ambulatory Visit: Payer: Self-pay

## 2021-06-19 VITALS — BP 124/75 | HR 69 | Temp 98.6°F | Ht 64.0 in | Wt 143.0 lb

## 2021-06-19 DIAGNOSIS — G8194 Hemiplegia, unspecified affecting left nondominant side: Secondary | ICD-10-CM | POA: Insufficient documentation

## 2021-06-19 DIAGNOSIS — I619 Nontraumatic intracerebral hemorrhage, unspecified: Secondary | ICD-10-CM | POA: Diagnosis not present

## 2021-06-19 NOTE — Patient Instructions (Addendum)
Follow up for adjustment and cognitive issues Dr Sima Matas , neuropsychologist   No driving until cleared by MD

## 2021-06-19 NOTE — Progress Notes (Signed)
Subjective:    Patient ID: Tonya [name], female    DOB: [birth year], 75 y.o.   MRN: [id number] Admit date: 02/27/2021 Discharge date: 03/25/2021 On stroke rehab unit for right parietal hemorrhage HPI Done with HHPT Started OPPT, last visit 9/22.  Focusing on avoiding objects, Left visuoperceptual issues interfering with gait OP OT- working on scanning during ADLs OP SLP Patient not driving.  She is not requiring much assistance for self-care activities.  Still issues with complex ADLs Pain Inventory Average Pain 4 Pain Right Now 4 My pain is constant, sharp, and aching  LOCATION OF PAIN  Right leg, Right hip, Right side pain  BOWEL Number of stools per week: 4  BLADDER Normal   Mobility how many minutes can you walk? 45 minutes, some stumbling ability to climb steps?  yes do you drive?  no Do you have any goals in this area?  yes  Function retired Do you have any goals in this area?  no  Neuro/Psych weakness numbness tremor tingling trouble walking dizziness confusion  Prior Studies Any changes since last visit?  no  Physicians involved in your care Any changes since last visit?  no   Family History  Problem Relation Age of Onset   Arrhythmia Mother        s/p Pacer    Heart attack Father    Heart disease Sister    Colon cancer Neg Hx    Social History   Socioeconomic History   Marital status: Married    Spouse name: Not on file   Number of children: 3   Years of education: Not on file   Highest education level: Not on file  Occupational History   Not on file  Tobacco Use   Smoking status: Former    Packs/day: 0.30    Years: 60.00    Pack years: 18.00    Types: Cigarettes    Quit date: 06/11/2020    Years since quitting: 1.0   Smokeless tobacco: Never  Vaping Use   Vaping Use: Never used  Substance and Sexual Activity   Alcohol use: Yes    Comment: 0.5 drink per month   Drug use: No   Sexual activity: Not Currently     Birth control/protection: Surgical    Comment: Hysterectomy  Other Topics Concern   Not on file  Social History Narrative   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    Social Determinants of Health   Financial Resource Strain: Not on file  Food Insecurity: Not on file  Transportation Needs: Not on file  Physical Activity: Not on file  Stress: Not on file  Social Connections: Not on file   Past Surgical History:  Procedure Laterality Date   ABDOMINAL HYSTERECTOMY     APPENDECTOMY     BRONCHIAL BIOPSY  07/18/2020   Procedure: BRONCHIAL BIOPSIES;  Surgeon: [name], DO;  Location: [facility];  Service: Pulmonary;;   BRONCHIAL BRUSHINGS  07/18/2020   Procedure: BRONCHIAL BRUSHINGS;  Surgeon: [name], DO;  Location: [facility];  Service: Pulmonary;;   BRONCHIAL NEEDLE ASPIRATION BIOPSY  07/18/2020   Procedure: BRONCHIAL NEEDLE ASPIRATION BIOPSIES;  Surgeon: [name], DO;  Location: [facility];  Service: Pulmonary;;   BRONCHIAL WASHINGS  07/18/2020   Procedure: BRONCHIAL WASHINGS;  Surgeon: [name], DO;  Location: [facility];  Service: Pulmonary;;   FIDUCIAL MARKER PLACEMENT  07/18/2020   Procedure: FIDUCIAL MARKER PLACEMENT;  Surgeon: [name], DO;  Location: [facility];  Service: Pulmonary;;   INTERCOSTAL NERVE BLOCK Right 08/26/2020   Procedure: INTERCOSTAL NERVE BLOCK;  Surgeon: [name], MD;  Location: [facility];  Service: Thoracic;  Laterality: Right;   IR THORACENTESIS ASP PLEURAL SPACE W/IMG GUIDE  10/10/2020   NODE DISSECTION Right 08/26/2020   Procedure: NODE DISSECTION;  Surgeon: [name], MD;  Location: [facility];  Service: Thoracic;  Laterality: Right;   THORACENTESIS Right 11/04/2020   Procedure: Mathews Robinsons;  Surgeon: [name], DO;  Location: [facility];  Service: Pulmonary;  Laterality: Right;   THORACENTESIS Right 11/29/2020   Procedure: THORACENTESIS;  Surgeon: [name], DO;  Location: [facility];   Service: Pulmonary;  Laterality: Right;   VIDEO BRONCHOSCOPY WITH ENDOBRONCHIAL NAVIGATION N/A 07/18/2020   Procedure: VIDEO BRONCHOSCOPY WITH ENDOBRONCHIAL NAVIGATION;  Surgeon: [name], DO;  Location: [facility];  Service: Pulmonary;  Laterality: N/A;   VIDEO BRONCHOSCOPY WITH ENDOBRONCHIAL ULTRASOUND  07/18/2020   Procedure: VIDEO BRONCHOSCOPY WITH ENDOBRONCHIAL ULTRASOUND;  Surgeon: [name], DO;  Location: [facility];  Service: Pulmonary;;   Past Medical History:  Diagnosis Date   Adenoma of left adrenal gland    Cancer ([facility])    Lung   Colon polyps    hyperplastic   Complication of anesthesia    Very emotional and cries after anesthesia   GERD (gastroesophageal reflux disease)    High blood pressure 03/25/2021   Kidney stone    Liver lesion    Microhematuria    BP 124/75   Pulse 69   Temp 98.6 F (37 C)   Ht 5\' 4"  (1.626 m)   Wt 143 lb (64.9 kg)   SpO2 98%   BMI 24.55 kg/m   Opioid Risk Score:   Fall Risk Score:  `1  Depression screen PHQ 2/9  Depression screen PHQ 2/9 04/15/2021  Decreased Interest 0  Down, Depressed, Hopeless 0  PHQ - 2 Score 0  Altered sleeping 1  Tired, decreased energy 1  Change in appetite 1  Feeling bad or failure about yourself  0  Trouble concentrating 0  Moving slowly or fidgety/restless 0  PHQ-9 Score 3    Review of Systems  Eyes:        Right eye waters  Cardiovascular:  Positive for leg swelling.       Left & Right  Musculoskeletal:        Stumbling with left leg  Neurological:  Positive for dizziness, tremors, weakness and light-headedness.  Psychiatric/Behavioral:  Positive for confusion.   All other systems reviewed and are negative.     Objective:   Physical Exam Vitals and nursing note reviewed.  Constitutional:      Appearance: She is normal weight.  HENT:     Head: Normocephalic and atraumatic.  Eyes:     Extraocular Movements: Extraocular movements intact.     Conjunctiva/sclera:  Conjunctivae normal.     Pupils: Pupils are equal, round, and reactive to light.  Neurological:     General: No focal deficit present.     Mental Status: She is alert and oriented to person, place, and time.     Cranial Nerves: No dysarthria.     Motor: No abnormal muscle tone.     Coordination: Coordination normal.     Gait: Tandem walk abnormal. Gait normal.     Comments: Motor strength is 5/5 bilateral deltoid, bicep, tricep, grip flexion, knee extension, ankle flexor plantar flexor Negative straight leg raising test leg test  bilaterally Sensation intact light touch and proprioception bilateral lower extremities Left-sided neglect  Psychiatric:        Mood and Affect: Mood normal.        Speech: Speech is delayed.        Behavior: Behavior is slowed.        Cognition and Memory: Cognition is impaired.    Standard gait is intact however tandem gait is impaired, able to do toe walk and heel walk however      Assessment & Plan:  1.  Right parietal hemorrhage, doing well from a motor standpoint still having visual perceptual issues as well as some higher-level balance issues.  We discussed that she should not drive until this clears up.  Still has a favorable prognosis over the next 6 months of improvement.  I do not think she can drive within the next 3 months as she directly asked We will continue outpatient OT and speech as well as PT Some adjustment disorder referral to neuropsychology. Physical medicine rehab follow-up in 6 weeks Follow-up with neurology Follow-up primary care

## 2021-06-20 ENCOUNTER — Ambulatory Visit: Payer: Medicare HMO

## 2021-06-20 DIAGNOSIS — R41841 Cognitive communication deficit: Secondary | ICD-10-CM

## 2021-06-20 DIAGNOSIS — R2681 Unsteadiness on feet: Secondary | ICD-10-CM | POA: Diagnosis not present

## 2021-06-20 NOTE — Therapy (Signed)
[facility] [address] [place], [place], [address] Phone: [phone]   Fax:  [phone]  Speech Language Pathology Treatment  Patient Details  Name: Tonya [name] MRN: [id number] Date of Birth: 02-12-1946 Referring Provider (SLP): Charlett Blake, MD   Encounter Date: 06/20/2021   End of Session - 06/20/21 1505     Visit Number 3    Number of Visits 17    Date for SLP Re-Evaluation 07/25/21    Authorization Type [employer]    SLP Start Time 1447    SLP Stop Time  1530    SLP Time Calculation (min) 43 min    Activity Tolerance Patient tolerated treatment well             Past Medical History:  Diagnosis Date   Adenoma of left adrenal gland    Cancer ([place])    Lung   Colon polyps    hyperplastic   Complication of anesthesia    Very emotional and cries after anesthesia   GERD (gastroesophageal reflux disease)    High blood pressure 03/25/2021   Kidney stone    Liver lesion    Microhematuria     Past Surgical History:  Procedure Laterality Date   ABDOMINAL HYSTERECTOMY     APPENDECTOMY     BRONCHIAL BIOPSY  07/18/2020   Procedure: BRONCHIAL BIOPSIES;  Surgeon: [name], DO;  Location: [facility];  Service: Pulmonary;;   BRONCHIAL BRUSHINGS  07/18/2020   Procedure: BRONCHIAL BRUSHINGS;  Surgeon: [name], DO;  Location: [facility];  Service: Pulmonary;;   BRONCHIAL NEEDLE ASPIRATION BIOPSY  07/18/2020   Procedure: BRONCHIAL NEEDLE ASPIRATION BIOPSIES;  Surgeon: [name], DO;  Location: [facility];  Service: Pulmonary;;   BRONCHIAL WASHINGS  07/18/2020   Procedure: BRONCHIAL WASHINGS;  Surgeon: [name], DO;  Location: [facility];  Service: Pulmonary;;   FIDUCIAL MARKER PLACEMENT  07/18/2020   Procedure: FIDUCIAL MARKER PLACEMENT;  Surgeon: [name], DO;  Location: [facility];  Service: Pulmonary;;   INTERCOSTAL NERVE BLOCK Right  08/26/2020   Procedure: INTERCOSTAL NERVE BLOCK;  Surgeon: [name], MD;  Location: [facility];  Service: Thoracic;  Laterality: Right;   IR THORACENTESIS ASP PLEURAL SPACE W/IMG GUIDE  10/10/2020   NODE DISSECTION Right 08/26/2020   Procedure: NODE DISSECTION;  Surgeon: [name], MD;  Location: [facility];  Service: Thoracic;  Laterality: Right;   THORACENTESIS Right 11/04/2020   Procedure: Mathews Robinsons;  Surgeon: [name], DO;  Location: [facility];  Service: Pulmonary;  Laterality: Right;   THORACENTESIS Right 11/29/2020   Procedure: THORACENTESIS;  Surgeon: [name], DO;  Location[facility];  Service: Pulmonary;  Laterality: Right;   VIDEO BRONCHOSCOPY WITH ENDOBRONCHIAL NAVIGATION N/A 07/18/2020   Procedure: VIDEO BRONCHOSCOPY WITH ENDOBRONCHIAL NAVIGATION;  Surgeon: [name], DO;  Location: [facility];  Service: Pulmonary;  Laterality: N/A;   VIDEO BRONCHOSCOPY WITH ENDOBRONCHIAL ULTRASOUND  07/18/2020   Procedure: VIDEO BRONCHOSCOPY WITH ENDOBRONCHIAL ULTRASOUND;  Surgeon: [name], DO;  Location: [facility];  Service: Pulmonary;;    There were no vitals filed for this visit.   Subjective Assessment - 06/20/21 1449     Subjective "I've been well"    Currently in Pain? Yes    Pain Score 4     Pain Location Leg                   ADULT SLP TREATMENT -  06/20/21 1449       General Information   Behavior/Cognition Alert;Cooperative;Pleasant mood      Treatment Provided   Treatment provided Cognitive-Linquistic      Cognitive-Linquistic Treatment   Treatment focused on Cognition    Skilled Treatment Pt reports good visit with neurologist. Pt recalled repeat MRI on 28th. Pt has been planning trips and decluttering at home. SLP engaged patient in functional grocery lists related to recall of budget, attention to specific items, and problem solving for identifying best value. Pt needed mild extra processing time to  complete task. Pt identified missed component x1 and errors x2 with independent double check. SLP provided cue to cross reference list versus selected items, which resulted in additional error x1 identified. Pt noted to have gone over budget, in which pt able to problem solve with occasional min A.      Assessment / Recommendations / Plan   Plan Continue with current plan of care      Progression Toward Goals   Progression toward goals Progressing toward goals              SLP Education - 06/20/21 1543     Education Details HEP    Person(s) Educated Patient;Spouse    Methods Explanation;Demonstration;Handout    Comprehension Verbalized understanding;Returned demonstration;Need further instruction              SLP Short Term Goals - 06/20/21 1505       SLP SHORT TERM GOAL #1   Title Pt will use memory compensations for appointments, finances, and other daily activities with rare min A  over 2 sessions    Time 4    Period Weeks    Status On-going    Target Date 06/27/21      SLP SHORT TERM GOAL #2   Title Pt will use verbalize and demo accurate problem solving for functional scenarios encountered at home with rare min A over 2 sessions    Time 4    Period Weeks    Status On-going    Target Date 06/27/21      SLP SHORT TERM GOAL #3   Title Pt will ID and correct errors on structured cognitive tasks with 80% accuracy given rare min A over 2 sessions    Time 4    Period Weeks    Status On-going    Target Date 06/27/21      SLP SHORT TERM GOAL #4   Title Pt will implement strategy to ensure stove is turned off after cooking for 3/3 opportunities    Time 4    Period Weeks    Status On-going    Target Date 06/27/21      SLP SHORT TERM GOAL #5   Title Pt will complete cognitive PROM in first few sessions    Time 4    Period Weeks    Status On-going    Target Date 06/27/21              SLP Long Term Goals - 06/20/21 1507       SLP LONG TERM GOAL #1    Title Pt will independently manage appointments and bill pay with use of compensations with no errors reported for 3/3 opportunities    Time 6    Period Weeks    Status On-going    Target Date 07/11/21      SLP LONG TERM GOAL #2   Title Pt will use independently demo accurate problem solving for  functional scenarios encountered at home over 2 sessions    Time 8    Period Weeks    Status On-going    Target Date 07/25/21      SLP LONG TERM GOAL #3   Title Pt will report improved cognitive linguistic functioning on PROM by 5 points at last ST session    Time 8    Period Weeks    Status On-going    Target Date 07/25/21              Plan - 06/20/21 1505     Clinical Impression Statement "Tonya [name]" was referred for OPST to address cognition 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 Pt was accompanied by her husband, [name]. Recent episodes of forgetting items and increased dependence on husband for recall reported. Pt desires to increase her independence. SLP targeted cognitive skills for managing budget and selecting appropriate grocery items from list, in which pt needed occasional min A to complete task accurately. Given change in baseline cognition, SLP recommends skilled ST intervention to address cognitive linguistic skills to maximize return to PLOF and increase functional independence.    Speech Therapy Frequency 2x / week    Duration 8 weeks    Treatment/Interventions Patient/family education;Functional tasks;Cognitive reorganization;Compensatory techniques;Internal/external aids;SLP instruction and feedback;Environmental controls;Cueing hierarchy;Compensatory strategies    Potential to Achieve Goals Good    Consulted and Agree with Plan of Care Patient;Family member/caregiver             Patient will benefit from skilled therapeutic intervention in order to improve the following deficits and impairments:   Cognitive communication deficit    Problem List Patient  Active Problem List   Diagnosis Date Noted   Swelling of both lower extremities 05/08/2021   Frequent headaches 03/25/2021   Sciatica of left side 03/25/2021   Chronic low back pain 03/25/2021   Anxiety disorder 03/25/2021   Intraparenchymal hematoma of brain 02/27/2021   Stroke, 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   Intracranial hemorrhage ([place]) 02/22/2021   Chemotherapy induced neutropenia ([place]) 01/08/2021   Bradycardia 01/01/2021   Hypomagnesemia 12/10/2020   Nausea without vomiting 12/10/2020   Status post thoracentesis    Pleural effusion, right 11/01/2020   Adenocarcinoma of right lung, stage 2 ([place]) 09/11/2020   Encounter for antineoplastic chemotherapy 09/11/2020   S/P Robotic Assited Video Thoracoscopy with Right Upper Lobectomy Lung 08/26/2020   Mediastinal adenopathy 07/18/2020   Lung nodules    Lung nodule 06/14/2020   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   Tobacco dependence 05/29/2020   Exertional chest pain 05/28/2020   Numbness and tingling of left arm and leg 07/01/2012   Hyperlipidemia with target low density lipoprotein (LDL) cholesterol less than 100 mg/dL 07/01/2012   Chest pressure 06/30/2012   GERD (gastroesophageal reflux disease) 06/30/2012    Alinda Deem, MA CCC-SLP 06/20/2021, 3:45 PM  [facility] [address] [place], [place], [address] Phone: [phone]   Fax:  [phone]   Name: KOREE STAHELI MRN: [id number] Date of Birth: 1946/04/10

## 2021-06-23 ENCOUNTER — Encounter: Payer: Self-pay | Admitting: Occupational Therapy

## 2021-06-23 ENCOUNTER — Other Ambulatory Visit: Payer: Self-pay

## 2021-06-23 ENCOUNTER — Ambulatory Visit: Payer: Medicare HMO | Admitting: Occupational Therapy

## 2021-06-23 ENCOUNTER — Ambulatory Visit: Payer: Medicare HMO

## 2021-06-23 DIAGNOSIS — R41844 Frontal lobe and executive function deficit: Secondary | ICD-10-CM

## 2021-06-23 DIAGNOSIS — R2681 Unsteadiness on feet: Secondary | ICD-10-CM | POA: Diagnosis not present

## 2021-06-23 DIAGNOSIS — R41841 Cognitive communication deficit: Secondary | ICD-10-CM

## 2021-06-23 DIAGNOSIS — R41842 Visuospatial deficit: Secondary | ICD-10-CM

## 2021-06-23 DIAGNOSIS — I69154 Hemiplegia and hemiparesis following nontraumatic intracerebral hemorrhage affecting left non-dominant side: Secondary | ICD-10-CM

## 2021-06-23 DIAGNOSIS — R278 Other lack of coordination: Secondary | ICD-10-CM

## 2021-06-23 DIAGNOSIS — R41840 Attention and concentration deficit: Secondary | ICD-10-CM

## 2021-06-23 DIAGNOSIS — I69118 Other symptoms and signs 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: Secondary | ICD-10-CM

## 2021-06-23 NOTE — Therapy (Signed)
[facility] [address] [place], [place], [address] Phone: [phone]   Fax:  [phone]  Speech Language Pathology Treatment  Patient Details  Name: Tonya [name] MRN: [id number] Date of Birth: June 11, 1946 Referring Provider (SLP): Charlett Blake, MD   Encounter Date: 06/23/2021   End of Session - 06/23/21 1000     Visit Number 4    Number of Visits 17    Date for SLP Re-Evaluation 07/25/21    Authorization Type [employer]    SLP Start Time 1020    SLP Stop Time  1104    SLP Time Calculation (min) 44 min    Activity Tolerance Patient tolerated treatment well             Past Medical History:  Diagnosis Date   Adenoma of left adrenal gland    Cancer ([place])    Lung   Colon polyps    hyperplastic   Complication of anesthesia    Very emotional and cries after anesthesia   GERD (gastroesophageal reflux disease)    High blood pressure 03/25/2021   Kidney stone    Liver lesion    Microhematuria     Past Surgical History:  Procedure Laterality Date   ABDOMINAL HYSTERECTOMY     APPENDECTOMY     BRONCHIAL BIOPSY  07/18/2020   Procedure: BRONCHIAL BIOPSIES;  Surgeon: [name], DO;  Location: [facility];  Service: Pulmonary;;   BRONCHIAL BRUSHINGS  07/18/2020   Procedure: BRONCHIAL BRUSHINGS;  Surgeon: [name], DO;  Location: [facility];  Service: Pulmonary;;   BRONCHIAL NEEDLE ASPIRATION BIOPSY  07/18/2020   Procedure: BRONCHIAL NEEDLE ASPIRATION BIOPSIES;  Surgeon: [name], DO;  Location: [facility];  Service: Pulmonary;;   BRONCHIAL WASHINGS  07/18/2020   Procedure: BRONCHIAL WASHINGS;  Surgeon: [name], DO;  Location: [facility];  Service: Pulmonary;;   FIDUCIAL MARKER PLACEMENT  07/18/2020   Procedure: FIDUCIAL MARKER PLACEMENT;  Surgeon: [name], DO;  Location: [facility];  Service: Pulmonary;;   INTERCOSTAL NERVE BLOCK Right  08/26/2020   Procedure: INTERCOSTAL NERVE BLOCK;  Surgeon: [name], MD;  Location: [facility];  Service: Thoracic;  Laterality: Right;   IR THORACENTESIS ASP PLEURAL SPACE W/IMG GUIDE  10/10/2020   NODE DISSECTION Right 08/26/2020   Procedure: NODE DISSECTION;  Surgeon: [name], MD;  Location: [facility];  Service: Thoracic;  Laterality: Right;   THORACENTESIS Right 11/04/2020   Procedure: Mathews Robinsons;  Surgeon: [name], DO;  Location: [facility];  Service: Pulmonary;  Laterality: Right;   THORACENTESIS Right 11/29/2020   Procedure: THORACENTESIS;  Surgeon: [name], DO;  Location: [facility];  Service: Pulmonary;  Laterality: Right;   VIDEO BRONCHOSCOPY WITH ENDOBRONCHIAL NAVIGATION N/A 07/18/2020   Procedure: VIDEO BRONCHOSCOPY WITH ENDOBRONCHIAL NAVIGATION;  Surgeon: [name], DO;  Location: [facility];  Service: Pulmonary;  Laterality: N/A;   VIDEO BRONCHOSCOPY WITH ENDOBRONCHIAL ULTRASOUND  07/18/2020   Procedure: VIDEO BRONCHOSCOPY WITH ENDOBRONCHIAL ULTRASOUND;  Surgeon: [name], DO;  Location: [facility];  Service: Pulmonary;;    There were no vitals filed for this visit.   Subjective Assessment - 06/23/21 1013     Subjective "It was good"    Currently in Pain? Yes    Pain Score 4     Pain Location Flank    Pain Orientation Right    Pain Descriptors / Indicators Aching  ADULT SLP TREATMENT - 06/23/21 0959       General Information   Behavior/Cognition Alert;Cooperative;Pleasant mood      Treatment Provided   Treatment provided Cognitive-Linquistic      Cognitive-Linquistic Treatment   Treatment focused on Cognition    Skilled Treatment Pt returned with cognitive PROM completed. Discussed results with patient and husband to determine cognitive deficits to target. Some confusion re: wording of question reported x1 ("I made simple mistakes more easily"), in which SLP clarified with functional  example. Pt reported some reduced comprehension of some targeted items and requested husband's assistance. Pt reported "sometimes" x1 for trouble keeping track of what I'm doing when interrupted. Pt mostly marked none or rarely for all other items. Pt indicated persistent change in memory with example provided related to driving directions and finding best pathway to car. SLP suggested patient trial recalling items on grocery list (written down) and stopping to process before initiating next step.      Assessment / Recommendations / Plan   Plan Continue with current plan of care      Progression Toward Goals   Progression toward goals Progressing toward goals              SLP Education - 06/23/21 1317     Education Details recall of grocery lists, stop and process first, [facility]    Person(s) Educated Patient;Spouse    Methods Explanation;Demonstration;Handout    Comprehension Verbalized understanding;Returned demonstration;Need further instruction;Verbal cues required              SLP Short Term Goals - 06/23/21 1000       SLP SHORT TERM GOAL #1   Title Pt will use memory compensations for appointments, finances, and other daily activities with rare min A  over 2 sessions    Baseline 06-23-21    Time 4    Period Weeks    Status On-going    Target Date 06/27/21      SLP SHORT TERM GOAL #2   Title Pt will use verbalize and demo accurate problem solving for functional scenarios encountered at home with rare min A over 2 sessions    Time 4    Period Weeks    Status On-going    Target Date 06/27/21      SLP SHORT TERM GOAL #3   Title Pt will ID and correct errors on structured cognitive tasks with 80% accuracy given rare min A over 2 sessions    Time 4    Period Weeks    Status On-going    Target Date 06/27/21      SLP SHORT TERM GOAL #4   Title Pt will implement strategy to ensure stove is turned off after cooking for 3/3 opportunities    Baseline 06-23-21     Time 4    Period Weeks    Status On-going    Target Date 06/27/21      SLP SHORT TERM GOAL #5   Title Pt will complete cognitive PROM in first few sessions    Status Achieved    Target Date 06/27/21              SLP Long Term Goals - 06/23/21 1001       SLP LONG TERM GOAL #1   Title Pt will independently manage appointments and bill pay with use of compensations with no errors reported for 3/3 opportunities    Time 6    Period Weeks    Status  On-going    Target Date 07/11/21      SLP LONG TERM GOAL #2   Title Pt will use independently demo accurate problem solving for functional scenarios encountered at home over 2 sessions    Time 8    Period Weeks    Status On-going    Target Date 07/25/21      SLP LONG TERM GOAL #3   Title Pt will report improved cognitive linguistic functioning on PROM by 5 points at last ST session    Time 8    Period Weeks    Status On-going    Target Date 07/25/21              Plan - 06/23/21 1000     Clinical Impression Statement "Tonya [name]" was referred for OPST to address cognition secondary to 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 Pt was accompanied by her husband, Tonya [name]. Recent episodes of forgetting items and increased dependence on husband for recall reported. Pt desires to increase her independence. SLP targeted cognitive skills and recommendations to increase functional independence and reduce reliance on husband.. Given change in baseline cognition, SLP recommends skilled ST intervention to address cognitive linguistic skills to maximize return to PLOF and increase functional independence.    Speech Therapy Frequency 2x / week    Duration 8 weeks    Treatment/Interventions Patient/family education;Functional tasks;Cognitive reorganization;Compensatory techniques;Internal/external aids;SLP instruction and feedback;Environmental controls;Cueing hierarchy;Compensatory strategies    Potential to Achieve Goals Good    SLP Home Exercise  Plan provided    Consulted and Agree with Plan of Care Patient;Family member/caregiver             Patient will benefit from skilled therapeutic intervention in order to improve the following deficits and impairments:   Cognitive communication deficit    Problem List Patient Active Problem List   Diagnosis Date Noted   Swelling of both lower extremities 05/08/2021   Frequent headaches 03/25/2021   Sciatica of left side 03/25/2021   Chronic low back pain 03/25/2021   Anxiety disorder 03/25/2021   Intraparenchymal hematoma of brain 02/27/2021   Stroke, 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[place]) 02/23/2021   Intracranial hemorrhage ([place]) 02/22/2021   Chemotherapy induced neutropenia ([place]) 01/08/2021   Bradycardia 01/01/2021   Hypomagnesemia 12/10/2020   Nausea without vomiting 12/10/2020   Status post thoracentesis    Pleural effusion, right 11/01/2020   Adenocarcinoma of right lung, stage 2 ([place]) 09/11/2020   Encounter for antineoplastic chemotherapy 09/11/2020   S/P Robotic Assited Video Thoracoscopy with Right Upper Lobectomy Lung 08/26/2020   Mediastinal adenopathy 07/18/2020   Lung nodules    Lung nodule 06/14/2020   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   Tobacco dependence 05/29/2020   Exertional chest pain 05/28/2020   Numbness and tingling of left arm and leg 07/01/2012   Hyperlipidemia with target low density lipoprotein (LDL) cholesterol less than 100 mg/dL 07/01/2012   Chest pressure 06/30/2012   GERD (gastroesophageal reflux disease) 06/30/2012    Alinda Deem, MA CCC-SLP 06/23/2021, 1:20 PM  [facility] [address] [place], [place], [address] Phone: [phone]   Fax:  [phone]   Name: Tonya [name] MRN: [id number] Date of Birth: [birth year]

## 2021-06-23 NOTE — Patient Instructions (Signed)
Constant Therapy - free 14 day trial   Pocket calendar? Work on phone calendar

## 2021-06-23 NOTE — Patient Instructions (Addendum)
  Visual & Cognitive Activities:  1.  Simple word search 2.  Read simple paragraphs/bible verses out loud 3.  With someone with you, look for items in grocery store 4.  Work simple jigsaw puzzles 5.  Play simple matching games online/tablet that make you search visually or spread out cards on table to match. 6.  Play board/card games (memory/matching, solitaire, connect 4, etc.) 7.  Write grocery list with help 8.  Connect the dot activities 9 .  [place] 10. [employer], coins, silverwear

## 2021-06-23 NOTE — Therapy (Signed)
[facility] [address] [place], [place], [address] Phone: [phone]   Fax:  [phone]  Occupational Therapy Treatment  Patient Details  Name: Tonya [name] MRN: [id number] Date of Birth: 1946-03-03 Referring Provider (OT): Dr. Alysia Penna   Encounter Date: 06/23/2021   OT End of Session - 06/23/21 0945     Visit Number 5    Number of Visits 17    Date for OT Re-Evaluation 07/28/21    Authorization Type [employer]; Out of pocket has been met in full, patient is covered 100%  Prior auth required--awaiting auth    Authorization - Visit Number 5    Authorization - Number of Visits 10    OT Start Time [phone]    OT Stop Time 1015    OT Time Calculation (min) 38 min    Equipment Utilized During Treatment FOTO completed but NP (score of functional intake measure of 91 for upper extremity; however, primary deficits are cognition, visual-perceptual, and balance)    Activity Tolerance Patient tolerated treatment well             Past Medical History:  Diagnosis Date   Adenoma of left adrenal gland    Cancer ([place])    Lung   Colon polyps    hyperplastic   Complication of anesthesia    Very emotional and cries after anesthesia   GERD (gastroesophageal reflux disease)    High blood pressure 03/25/2021   Kidney stone    Liver lesion    Microhematuria     Past Surgical History:  Procedure Laterality Date   ABDOMINAL HYSTERECTOMY     APPENDECTOMY     BRONCHIAL BIOPSY  07/18/2020   Procedure: BRONCHIAL BIOPSIES;  Surgeon: [name], DO;  Location: [facility];  Service: Pulmonary;;   BRONCHIAL BRUSHINGS  07/18/2020   Procedure: BRONCHIAL BRUSHINGS;  Surgeon: [name], DO;  Location: [facility];  Service: Pulmonary;;   BRONCHIAL NEEDLE ASPIRATION BIOPSY  07/18/2020   Procedure: BRONCHIAL NEEDLE ASPIRATION BIOPSIES;  Surgeon: [name], DO;  Location: [facility];   Service: Pulmonary;;   BRONCHIAL WASHINGS  07/18/2020   Procedure: BRONCHIAL WASHINGS;  Surgeon: [name], DO;  Location: [facility];  Service: Pulmonary;;   FIDUCIAL MARKER PLACEMENT  07/18/2020   Procedure: FIDUCIAL MARKER PLACEMENT;  Surgeon: [name], DO;  Location: [facility];  Service: Pulmonary;;   INTERCOSTAL NERVE BLOCK Right 08/26/2020   Procedure: INTERCOSTAL NERVE BLOCK;  Surgeon: [name], MD;  Location: [facility];  Service: Thoracic;  Laterality: Right;   IR THORACENTESIS ASP PLEURAL SPACE W/IMG GUIDE  10/10/2020   NODE DISSECTION Right 08/26/2020   Procedure: NODE DISSECTION;  Surgeon: [name], MD;  Location: [facility];  Service: Thoracic;  Laterality: Right;   THORACENTESIS Right 11/04/2020   Procedure: Mathews Robinsons;  Surgeon: [name], DO;  Location: [facility];  Service: Pulmonary;  Laterality: Right;   THORACENTESIS Right 11/29/2020   Procedure: THORACENTESIS;  Surgeon: [name], DO;  Location: [facility];  Service: Pulmonary;  Laterality: Right;   VIDEO BRONCHOSCOPY WITH ENDOBRONCHIAL NAVIGATION N/A 07/18/2020   Procedure: VIDEO BRONCHOSCOPY WITH ENDOBRONCHIAL NAVIGATION;  Surgeon: [name], DO;  Location: [facility];  Service: Pulmonary;  Laterality: N/A;   VIDEO BRONCHOSCOPY WITH ENDOBRONCHIAL ULTRASOUND  07/18/2020   Procedure: VIDEO BRONCHOSCOPY WITH ENDOBRONCHIAL ULTRASOUND;  Surgeon: [name], DO;  Location: [facility];  Service: Pulmonary;;    There were no  vitals filed for this visit.   Subjective Assessment - 06/23/21 0940     Subjective  Pt reports continued concerns about balance and L peripheral vision.  Pt reports walking everyday and not bumping into things as much.  Going to grocery store, cooking (husband at home), sometimes needs help tilting hot pan but was able to do it yesterday.    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    PMH includes: lung cancer s/p R upper  lobectomy, currently undergoing chemo, GERD, HLD, bradycardia    Limitations fall risk, L inattention    Patient Stated Goals be indendepent and return to driving    Currently in Pain? Yes    Pain Score 4     Pain Location Flank    Pain Orientation Right    Pain Type Acute pain    Pain Onset In the past 7 days    Pain Frequency Intermittent    Aggravating Factors  nothing    Pain Relieving Factors improving, pain meds             Copying PVC Design for attention, visual scanning, problem-solving, organization:  Pt cued initially to sort pieces and was able to do so with incr time.  Then pt needed min-mod cueing to identify each piece once sorted.  Then copying design with mod cueing for orientation, visual perceptual and problem-solving.  Placing Perfection Pieces in Board with L hand for incr coordination, visual perceptual skills, problem solving, and visual scanning to the L with incr time/min-mod difficulty.      OT Education - 06/23/21 1021     Education Details Visual/Cognitive HEP--see pt instructions    Person(s) Educated Patient;Spouse    Methods Explanation;Handout    Comprehension Verbalized understanding              OT Short Term Goals - 06/02/21 1239       OT SHORT TERM GOAL #1   Title Pt will verbalize understanding of visual compensation strategies for ADLs/IADLs for incr ease/safety.--check STGs 06/28/21    Time 4    Period Weeks    Status New      OT SHORT TERM GOAL #2   Title Pt will perform simple environmental scanning/navigation with at least 90% accuracy and no LOB.    Time 4    Period Weeks    Status New      OT SHORT TERM GOAL #3   Title Pt will perform divided attention between at least 1 cognitive and 1 physical task with at least 75% accuracy.    Time 4    Period Weeks    Status New      OT SHORT TERM GOAL #4   Title Pt will be independent with LUE HEP for mild coordination/strength deficits.    Period Weeks    Status New                OT Long Term Goals - 05/29/21 1521       OT LONG TERM GOAL #1   Title Pt will verbalize understanding of memory/cognitive compensation strategies for ADLs/IADLs for incr ease/safety.--check LTGs 07/28/21    Time 8    Period Weeks    Status New      OT LONG TERM GOAL #2   Title Pt will perform environmental scanning and navigation in busy, dynamic environment with at least 95% accuracy.    Time 8    Period Weeks    Status  New      OT LONG TERM GOAL #3   Title Pt will perform divided attention between at least 1 cognitive and 1 physical task with at least 90% accuracy.    Time 8    Period Weeks    Status New      OT LONG TERM GOAL #4   Title Pt will be independent with HEP for visual scanning/cognition.    Time 8    Period Weeks    Status New                   Plan - 06/23/21 0945     Clinical Impression Statement Pt is progressing towards goals, however she continues to demonstate memory deficits and L inattention.    OT Occupational Profile and History Detailed Assessment- Review of Records and additional review of physical, cognitive, psychosocial history related to current functional performance    Occupational performance deficits (Please refer to evaluation for details): ADL's;IADL's;Leisure    Body Structure / Function / Physical Skills ADL;UE functional use;IADL;Balance;Dexterity;Coordination;FMC;Strength    Cognitive Skills Memory;Attention;Problem Solve;Safety Awareness;Sequencing    Rehab Potential Good    Clinical Decision Making Several treatment options, min-mod task modification necessary    Comorbidities Affecting Occupational Performance: May have comorbidities impacting occupational performance    Modification or Assistance to Complete Evaluation  Min-Moderate modification of tasks or assist with assess necessary to complete eval    OT Frequency 2x / week    OT Duration 8 weeks   +eval   OT Treatment/Interventions Self-care/ADL  training;DME and/or AE instruction;Balance training;Therapeutic activities;Cognitive remediation/compensation;Therapeutic exercise;Neuromuscular education;Functional Mobility Training;Visual/perceptual remediation/compensation;Patient/family education    Plan simple snack prep or cooking task with focus on scanning, reinforce visual compensations, alternating attention    Consulted and Agree with Plan of Care Patient;Family member/caregiver    Family Member Consulted husband             Patient will benefit from skilled therapeutic intervention in order to improve the following deficits and impairments:   Body Structure / Function / Physical Skills: ADL, UE functional use, IADL, Balance, Dexterity, Coordination, FMC, Strength Cognitive Skills: Memory, Attention, Problem Solve, Safety Awareness, Sequencing     Visit Diagnosis: Attention and concentration deficit  Frontal lobe and executive function deficit  Visuospatial deficit  Other lack of coordination  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)  Other symptoms and signs 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  Unsteadiness on feet    Problem List Patient Active Problem List   Diagnosis Date Noted   Swelling of both lower extremities 05/08/2021   Frequent headaches 03/25/2021   Sciatica of left side 03/25/2021   Chronic low back pain 03/25/2021   Anxiety disorder 03/25/2021   Intraparenchymal hematoma of brain 02/27/2021   Stroke, 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[place]) 02/23/2021   Intracranial hemorrhage ([place]) 02/22/2021   Chemotherapy induced neutropenia ([place]) 01/08/2021   Bradycardia 01/01/2021   Hypomagnesemia 12/10/2020   Nausea without vomiting 12/10/2020   Status post thoracentesis    Pleural effusion, right 11/01/2020   Adenocarcinoma of right lung, stage 2 ([place]) 09/11/2020   Encounter for antineoplastic chemotherapy  09/11/2020   S/P Robotic Assited Video Thoracoscopy with Right Upper Lobectomy Lung 08/26/2020   Mediastinal adenopathy 07/18/2020   Lung nodules    Lung nodule 06/14/2020   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   Tobacco dependence 05/29/2020   Exertional chest pain 05/28/2020   Numbness and tingling of left arm and leg 07/01/2012  Hyperlipidemia with target low density lipoprotein (LDL) cholesterol less than 100 mg/dL 07/01/2012   Chest pressure 06/30/2012   GERD (gastroesophageal reflux disease) 06/30/2012    [facility], OT/L 06/23/2021, 10:29 AM  [facility] [address] [place], [place], [address] Phone: [phone]   Fax:  [phone]  Name: Tonya [name] MRN: [id number] Date of Birth: 1945-10-07  Vianne Bulls, OTR/L [facility] [address] [place], [place]  [address] [phone] phone [phone] 06/23/21 10:29 AM

## 2021-06-26 ENCOUNTER — Ambulatory Visit: Payer: Medicare HMO | Admitting: Physical Medicine & Rehabilitation

## 2021-06-26 ENCOUNTER — Encounter: Payer: Self-pay | Admitting: Occupational Therapy

## 2021-06-26 ENCOUNTER — Ambulatory Visit: Payer: Medicare HMO | Admitting: Physical Therapy

## 2021-06-26 ENCOUNTER — Other Ambulatory Visit: Payer: Self-pay

## 2021-06-26 ENCOUNTER — Ambulatory Visit: Payer: Medicare HMO | Admitting: Occupational Therapy

## 2021-06-26 ENCOUNTER — Encounter: Payer: Self-pay | Admitting: Physical Therapy

## 2021-06-26 DIAGNOSIS — R2681 Unsteadiness on feet: Secondary | ICD-10-CM

## 2021-06-26 DIAGNOSIS — R41844 Frontal lobe and executive function deficit: Secondary | ICD-10-CM

## 2021-06-26 DIAGNOSIS — R41840 Attention and concentration deficit: Secondary | ICD-10-CM

## 2021-06-26 DIAGNOSIS — R2689 Other abnormalities of gait and mobility: Secondary | ICD-10-CM

## 2021-06-26 DIAGNOSIS — I69118 Other symptoms and signs 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: Secondary | ICD-10-CM

## 2021-06-26 DIAGNOSIS — R278 Other lack of coordination: Secondary | ICD-10-CM

## 2021-06-26 DIAGNOSIS — I69154 Hemiplegia and hemiparesis following nontraumatic intracerebral hemorrhage affecting left non-dominant side: Secondary | ICD-10-CM

## 2021-06-26 DIAGNOSIS — R41842 Visuospatial deficit: Secondary | ICD-10-CM

## 2021-06-26 NOTE — Patient Instructions (Signed)
Access Code: [email or web address] URL: [email or web address] Date: 06/26/2021 Prepared by: Willow Ora  Exercises Standing Single Leg Stance with Counter Support - 1 x daily - 5 x weekly - 1 sets - 3 reps - 15 seconds hold Standing Tandem Balance with Counter Support - 1 x daily - 5 x weekly - 1 sets - 3 reps - 15 hold Standing Near Stance in Corner with Eyes Closed - 1 x daily - 5 x weekly - 1 sets - 3 reps - 30 seconds hold Standing Balance in Corner with Eyes Closed - 1 x daily - 5 x weekly - 1 sets - 10 reps

## 2021-06-26 NOTE — Progress Notes (Signed)
I agree with the above plan 

## 2021-06-26 NOTE — Therapy (Signed)
[facility] [address] [place], [place], [address] Phone: [phone]   Fax:  [phone]  Occupational Therapy Treatment  Patient Details  Name: Tonya [name] MRN: [id number] Date of Birth: 01/13/1946 Referring Provider (OT): Dr. Alysia Penna   Encounter Date: 06/26/2021   OT End of Session - 06/26/21 1325     Visit Number 6    Number of Visits 17    Date for OT Re-Evaluation 07/28/21    Authorization Type [employer]; Out of pocket has been met in full, patient is covered 100%  Prior auth required--awaiting auth    Authorization - Visit Number 6    Authorization - Number of Visits 10    OT Start Time 1320    OT Stop Time 1410    OT Time Calculation (min) 50 min    Equipment Utilized During Treatment FOTO completed but NP (score of functional intake measure of 91 for upper extremity; however, primary deficits are cognition, visual-perceptual, and balance)    Activity Tolerance Patient tolerated treatment well             Past Medical History:  Diagnosis Date   Adenoma of left adrenal gland    Cancer ([place])    Lung   Colon polyps    hyperplastic   Complication of anesthesia    Very emotional and cries after anesthesia   GERD (gastroesophageal reflux disease)    High blood pressure 03/25/2021   Kidney stone    Liver lesion    Microhematuria     Past Surgical History:  Procedure Laterality Date   ABDOMINAL HYSTERECTOMY     APPENDECTOMY     BRONCHIAL BIOPSY  07/18/2020   Procedure: BRONCHIAL BIOPSIES;  Surgeon: [name], DO;  Location: [facility];  Service: Pulmonary;;   BRONCHIAL BRUSHINGS  07/18/2020   Procedure: BRONCHIAL BRUSHINGS;  Surgeon: [name], DO;  Location: [facility];  Service: Pulmonary;;   BRONCHIAL NEEDLE ASPIRATION BIOPSY  07/18/2020   Procedure: BRONCHIAL NEEDLE ASPIRATION BIOPSIES;  Surgeon: [name], DO;  Location: [facility];   Service: Pulmonary;;   BRONCHIAL WASHINGS  07/18/2020   Procedure: BRONCHIAL WASHINGS;  Surgeon: [name], DO;  Location: [facility];  Service: Pulmonary;;   FIDUCIAL MARKER PLACEMENT  07/18/2020   Procedure: FIDUCIAL MARKER PLACEMENT;  Surgeon: [name], DO;  Location: [facility];  Service: Pulmonary;;   INTERCOSTAL NERVE BLOCK Right 08/26/2020   Procedure: INTERCOSTAL NERVE BLOCK;  Surgeon: [name], MD;  Location: [facility];  Service: Thoracic;  Laterality: Right;   IR THORACENTESIS ASP PLEURAL SPACE W/IMG GUIDE  10/10/2020   NODE DISSECTION Right 08/26/2020   Procedure: NODE DISSECTION;  Surgeon: [name], MD;  Location: [facility];  Service: Thoracic;  Laterality: Right;   THORACENTESIS Right 11/04/2020   Procedure: Mathews Robinsons;  Surgeon: [name], DO;  Location: [facility];  Service: Pulmonary;  Laterality: Right;   THORACENTESIS Right 11/29/2020   Procedure: THORACENTESIS;  Surgeon: [name], DO;  Location: [facility];  Service: Pulmonary;  Laterality: Right;   VIDEO BRONCHOSCOPY WITH ENDOBRONCHIAL NAVIGATION N/A 07/18/2020   Procedure: VIDEO BRONCHOSCOPY WITH ENDOBRONCHIAL NAVIGATION;  Surgeon: [name], DO;  Location: [facility];  Service: Pulmonary;  Laterality: N/A;   VIDEO BRONCHOSCOPY WITH ENDOBRONCHIAL ULTRASOUND  07/18/2020   Procedure: VIDEO BRONCHOSCOPY WITH ENDOBRONCHIAL ULTRASOUND;  Surgeon: [name], DO;  Location: [facility] [facility];  Service: Pulmonary;;    There were no  vitals filed for this visit.   Subjective Assessment - 06/26/21 1322     Subjective  still aches in R side, feel better about 30-6mn better after walking.  Still bumps into recliner and corner of kitchen counter on the L.  MRI 07/15/21    Pertinent History R parietal lobe ICH and R frontal SAH.    PMH includes: lung cancer s/p R upper lobectomy, currently undergoing chemo, GERD, HLD, bradycardia    Limitations fall risk, L inattention     Patient Stated Goals be indendepent and return to driving    Currently in Pain? Yes    Pain Score 4     Pain Location Flank    Pain Orientation Right    Pain Descriptors / Indicators Aching    Pain Type Acute pain    Pain Onset In the past 7 days    Pain Frequency Intermittent    Aggravating Factors  prolonged sitting and/or walking    Pain Relieving Factors pain meds help some             Simple cooking task with directions (making brownies from mix).  Pt gathered needed items/ingredients first (reinforced that this is a good strategy); however, missed cooking spray initially, min cueing needed to locate.  Pt also didn't realize that she had tablespoon measuring spoon in her hand (connected to teaspoon) and omitted cooking oil x2 when reading directions.  Pt demo good safety and organization; but needed incr time and repetition for reading direction.  Self corrected all other initial errors.  Pt cleaned area and put away items when completed, but cued to place brownies in oven prior to cleaning.  Therapist initiated setting timer and removed brownies due to time constraints.  While brownies in the oven, dicussed difficulties and reinforced compensation strategies.  If something doesn't make since, re-check, make sure she double checks L side and looks to edge of objects with scanning.    Discussed how cooking is going at home and strategies that pt is currently using.  Reinforced use of lists, calendar.         OT Short Term Goals - 06/26/21 1424       OT SHORT TERM GOAL #1   Title Pt will verbalize understanding of visual compensation strategies for ADLs/IADLs for incr ease/safety.--check STGs 06/28/21    Time 4    Period Weeks    Status New      OT SHORT TERM GOAL #2   Title Pt will perform simple environmental scanning/navigation with at least 90% accuracy and no LOB.    Time 4    Period Weeks    Status New      OT SHORT TERM GOAL #3   Title Pt will perform divided  attention between at least 1 cognitive and 1 physical task with at least 75% accuracy.    Time 4    Period Weeks    Status New      OT SHORT TERM GOAL #4   Title Pt will be independent with LUE HEP for mild coordination/strength deficits.    Period Weeks    Status New               OT Long Term Goals - 05/29/21 1521       OT LONG TERM GOAL #1   Title Pt will verbalize understanding of memory/cognitive compensation strategies for ADLs/IADLs for incr ease/safety.--check LTGs 07/28/21    Time 8    Period Weeks  Status New      OT LONG TERM GOAL #2   Title Pt will perform environmental scanning and navigation in busy, dynamic environment with at least 95% accuracy.    Time 8    Period Weeks    Status New      OT LONG TERM GOAL #3   Title Pt will perform divided attention between at least 1 cognitive and 1 physical task with at least 90% accuracy.    Time 8    Period Weeks    Status New      OT LONG TERM GOAL #4   Title Pt will be independent with HEP for visual scanning/cognition.    Time 8    Period Weeks    Status New                   Plan - 06/26/21 1423     Clinical Impression Statement Pt needed incr time/min cueing for visual scanning in functional cooking task as pt missed item on L, needed to re-read directions multiple times with omission initially.    OT Occupational Profile and History Detailed Assessment- Review of Records and additional review of physical, cognitive, psychosocial history related to current functional performance    Occupational performance deficits (Please refer to evaluation for details): ADL's;IADL's;Leisure    Body Structure / Function / Physical Skills ADL;UE functional use;IADL;Balance;Dexterity;Coordination;FMC;Strength    Cognitive Skills Memory;Attention;Problem Solve;Safety Awareness;Sequencing    Rehab Potential Good    Clinical Decision Making Several treatment options, min-mod task modification necessary     Comorbidities Affecting Occupational Performance: May have comorbidities impacting occupational performance    Modification or Assistance to Complete Evaluation  Min-Moderate modification of tasks or assist with assess necessary to complete eval    OT Frequency 2x / week    OT Duration 8 weeks   +eval   OT Treatment/Interventions Self-care/ADL training;DME and/or AE instruction;Balance training;Therapeutic activities;Cognitive remediation/compensation;Therapeutic exercise;Neuromuscular education;Functional Mobility Training;Visual/perceptual remediation/compensation;Patient/family education    Plan check STGs, environmental scanning, alternating-divided attention (added OT on 11/10--inform/check with pt)    Consulted and Agree with Plan of Care Patient;Family member/caregiver    Family Member Consulted husband             Patient will benefit from skilled therapeutic intervention in order to improve the following deficits and impairments:   Body Structure / Function / Physical Skills: ADL, UE functional use, IADL, Balance, Dexterity, Coordination, FMC, Strength Cognitive Skills: Memory, Attention, Problem Solve, Safety Awareness, Sequencing     Visit Diagnosis: Attention and concentration deficit  Frontal lobe and executive function deficit  Visuospatial deficit  Other lack of coordination  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)  Other symptoms and signs 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  Unsteadiness on feet    Problem List Patient Active Problem List   Diagnosis Date Noted   Swelling of both lower extremities 05/08/2021   Frequent headaches 03/25/2021   Sciatica of left side 03/25/2021   Chronic low back pain 03/25/2021   Anxiety disorder 03/25/2021   Intraparenchymal hematoma of brain 02/27/2021   Stroke, 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[place]) 02/23/2021   Intracranial  hemorrhage ([place]) 02/22/2021   Chemotherapy induced neutropenia ([place]) 01/08/2021   Bradycardia 01/01/2021   Hypomagnesemia 12/10/2020   Nausea without vomiting 12/10/2020   Status post thoracentesis    Pleural effusion, right 11/01/2020   Adenocarcinoma of right lung, stage 2 ([place]) 09/11/2020   Encounter for antineoplastic chemotherapy 09/11/2020  S/P Robotic Assited Video Thoracoscopy with Right Upper Lobectomy Lung 08/26/2020   Mediastinal adenopathy 07/18/2020   Lung nodules    Lung nodule 06/14/2020   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   Tobacco dependence 05/29/2020   Exertional chest pain 05/28/2020   Numbness and tingling of left arm and leg 07/01/2012   Hyperlipidemia with target low density lipoprotein (LDL) cholesterol less than 100 mg/dL 07/01/2012   Chest pressure 06/30/2012   GERD (gastroesophageal reflux disease) 06/30/2012    [facility], OT/L 06/26/2021, 2:43 PM  [facility] [address] [place], [place], [address] Phone: [phone]   Fax:  [phone]  Name: MARCAYLA BUDGE MRN: [id number] Date of Birth: 04/30/1946  Vianne Bulls, OTR/L [facility] [address] [place], [place]  [address] [phone] phone [phone] 06/26/21 2:43 PM

## 2021-06-27 NOTE — Therapy (Signed)
[facility] [address] [place], [place], [address] Phone: [phone]   Fax:  [phone]  Physical Therapy Treatment  Patient Details  Name: Tonya [name] MRN: [id number] Date of Birth: [birth year] No data recorded  Encounter Date: 06/26/2021   PT End of Session - 06/26/21 1630     Visit Number 2    Number of Visits 13    Date for PT Re-Evaluation 07/13/21    Authorization Type [employer]- waiting for [place] - Visit Number 1    Progress Note Due on Visit 10    PT Start Time 1233    PT Stop Time 6962    PT Time Calculation (min) 42 min    Equipment Utilized During Treatment Gait belt    Activity Tolerance Patient tolerated treatment well    Behavior During Therapy [facility] for tasks assessed/performed             Past Medical History:  Diagnosis Date   Adenoma of left adrenal gland    Cancer ([place])    Lung   Colon polyps    hyperplastic   Complication of anesthesia    Very emotional and cries after anesthesia   GERD (gastroesophageal reflux disease)    High blood pressure 03/25/2021   Kidney stone    Liver lesion    Microhematuria     Past Surgical History:  Procedure Laterality Date   ABDOMINAL HYSTERECTOMY     APPENDECTOMY     BRONCHIAL BIOPSY  07/18/2020   Procedure: BRONCHIAL BIOPSIES;  Surgeon: [name], DO;  Location: [facility];  Service: Pulmonary;;   BRONCHIAL BRUSHINGS  07/18/2020   Procedure: BRONCHIAL BRUSHINGS;  Surgeon: [name], DO;  Location: [facility];  Service: Pulmonary;;   BRONCHIAL NEEDLE ASPIRATION BIOPSY  07/18/2020   Procedure: BRONCHIAL NEEDLE ASPIRATION BIOPSIES;  Surgeon: [name], DO;  Location: [facility];  Service: Pulmonary;;   BRONCHIAL WASHINGS  07/18/2020   Procedure: BRONCHIAL WASHINGS;  Surgeon: [name], DO;  Location: [facility];  Service: Pulmonary;;   FIDUCIAL MARKER PLACEMENT  07/18/2020    Procedure: FIDUCIAL MARKER PLACEMENT;  Surgeon: [name], DO;  Location: [facility];  Service: Pulmonary;;   INTERCOSTAL NERVE BLOCK Right 08/26/2020   Procedure: INTERCOSTAL NERVE BLOCK;  Surgeon: [name], MD;  Location: [facility];  Service: Thoracic;  Laterality: Right;   IR THORACENTESIS ASP PLEURAL SPACE W/IMG GUIDE  10/10/2020   NODE DISSECTION Right 08/26/2020   Procedure: NODE DISSECTION;  Surgeon: [name], MD;  Location: [facility];  Service: Thoracic;  Laterality: Right;   THORACENTESIS Right 11/04/2020   Procedure: [facility]insons;  Surgeon: [name], DO;  Location: [facility];  Service: Pulmonary;  Laterality: Right;   THORACENTESIS Right 11/29/2020   Procedure: THORACENTESIS;  Surgeon: [name], DO;  Location: [facility];  Service: Pulmonary;  Laterality: Right;   VIDEO BRONCHOSCOPY WITH ENDOBRONCHIAL NAVIGATION N/A 07/18/2020   Procedure: VIDEO BRONCHOSCOPY WITH ENDOBRONCHIAL NAVIGATION;  Surgeon: [name], DO;  Location: [facility];  Service: Pulmonary;  Laterality: N/A;   VIDEO BRONCHOSCOPY WITH ENDOBRONCHIAL ULTRASOUND  07/18/2020   Procedure: VIDEO BRONCHOSCOPY WITH ENDOBRONCHIAL ULTRASOUND;  Surgeon: [name], DO;  Location: [facility];  Service: Pulmonary;;    There were no vitals filed for this visit.   Subjective Assessment - 06/26/21 1238     Subjective No new complaints. No falls.    Pertinent History HTN,  lung cancer, GERD, HLD    Limitations Walking;House hold activities;Standing    How long can you stand comfortably? >20 mins    How long can you walk comfortably? 30 mins    Patient Stated Goals "I want to improve my balance"    Currently in Pain? Yes    Pain Score 4     Pain Location Flank    Pain Orientation Right    Pain Descriptors / Indicators Aching    Pain Type Chronic pain;Acute pain    Pain Onset 1 to 4 weeks ago    Pain Frequency Intermittent    Aggravating Factors  prolonged sitting  and/or walking    Pain Relieving Factors pain meds helping decrease intensity of pain                [facility] PT Assessment - 06/26/21 1239       Standardized Balance Assessment   Standardized Balance Assessment Timed Up and Go Test      Timed Up and Go Test   TUG Normal TUG;Cognitive TUG    Normal TUG (seconds) 12.03   no AD   Cognitive TUG (seconds) 13.82   counting backwards by 3 from 46     Functional Gait  Assessment   Gait assessed  Yes    Gait Level Surface Walks 20 ft, slow speed, abnormal gait pattern, evidence for imbalance or deviates 10-15 in outside of the 12 in walkway width. Requires more than 7 sec to ambulate 20 ft.   7.97 sec's   Change in Gait Speed Able to smoothly change walking speed without loss of balance or gait deviation. Deviate no more than 6 in outside of the 12 in walkway width.    Gait with Horizontal Head Turns Performs head turns smoothly with slight change in gait velocity (eg, minor disruption to smooth gait path), deviates 6-10 in outside 12 in walkway width, or uses an assistive device.   decr gait speed with minor veering   Gait with Vertical Head Turns Performs task with slight change in gait velocity (eg, minor disruption to smooth gait path), deviates 6 - 10 in outside 12 in walkway width or uses assistive device   decr gait speed   Gait and Pivot Turn Pivot turns safely in greater than 3 sec and stops with no loss of balance, or pivot turns safely within 3 sec and stops with mild imbalance, requires small steps to catch balance.   >3 sec's   Step Over Obstacle Is able to step over one shoe box (4.5 in total height) without changing gait speed. No evidence of imbalance.    Gait with Narrow Base of Support Ambulates less than 4 steps heel to toe or cannot perform without assistance.    Gait with Eyes Closed Walks 20 ft, slow speed, abnormal gait pattern, evidence for imbalance, deviates 10-15 in outside 12 in walkway width. Requires more than 9 sec  to ambulate 20 ft.   13.40 sec's   Ambulating Backwards Walks 20 ft, uses assistive device, slower speed, mild gait deviations, deviates 6-10 in outside 12 in walkway width.   21.09 sec's   Steps Alternating feet, must use rail.    Total Score 17    FGA comment: < 19 = high risk fall                     [facility] Adult PT Treatment/Exercise - 06/26/21 1239       Transfers  Transfers Sit to Stand;Stand to Sit    Sit to Stand 5: Supervision    Five time sit to stand comments  13.94 sec's no UE support from standard height surface    Stand to Sit 5: Supervision      Ambulation/Gait   Ambulation/Gait Yes    Ambulation/Gait Assistance 5: Supervision;4: Min guard    Ambulation Distance (Feet) --   around clinic with session   Assistive device None    Gait Pattern Step-through pattern;Decreased stride length;Left foot flat;Trunk flexed;Narrow base of support;Poor foot clearance - left;Poor foot clearance - right    Ambulation Surface Level;Indoor    Gait velocity 12.84= 2.55 ft/sec      Exercises   Exercises Other Exercises    Other Exercises  issued ex's to HEP for balance. Refer to [facility] for full details. Min guard assist for balance for safety.             Issued to HEP this session:  Access Code: [email or web address] URL: [email or web address] Date: 06/26/2021 Prepared by: Willow Ora  Exercises Standing Single Leg Stance with Counter Support - 1 x daily - 5 x weekly - 1 sets - 3 reps - 15 seconds hold Standing Tandem Balance with Counter Support - 1 x daily - 5 x weekly - 1 sets - 3 reps - 15 hold Standing Near Stance in Corner with Eyes Closed - 1 x daily - 5 x weekly - 1 sets - 3 reps - 30 seconds hold Standing Balance in Corner with Eyes Closed - 1 x daily - 5 x weekly - 1 sets - 10 reps       PT Education - 06/27/21 1642     Education Details progress toward goals, HEP for strengthening and balance    Person(s) Educated Patient    Methods  Explanation;Demonstration;Verbal cues;Handout    Comprehension Verbalized understanding;Returned demonstration;Verbal cues required;Need further instruction              PT Short Term Goals - 06/26/21 1653       PT SHORT TERM GOAL #1   Title Pt will be IND with initial HEP in order to indicate improved functional mobility and dec fall risk.  (Target Date:06/19/21)    Baseline 06/26/21: HEP just issued this session    Status Not Met      PT SHORT TERM GOAL #2   Title Pt will improve gait speed to >/=2.65 ft/sec in order to indicate safe community ambulation.    Baseline 06/26/21: 2.55 ft/sec no AD, improved just not to goal level    Status Partially Met      PT SHORT TERM GOAL #3   Title Pt will improve 5TSS to </=14 secs without UE support in order to ind dec fall risk and improved functional strength.    Baseline 06/26/21: 13.94 sec's no UE support from standard height surface    Status Achieved      PT SHORT TERM GOAL #4   Title Pt will improve FGA to >/=20/30  in order to indicate dec fall risk.    Baseline 06/26/21: 17/30 scored today, improved by 1 point from last assessment    Status Not Met      PT SHORT TERM GOAL #5   Title Will assess cognitive TUG and write appropriate LTG to reflect dec fall risk    Baseline 06/26/21: 13.82 sec's no AD while counting backwards from 3. PT to set goals.    Time --    Period --  Status Achieved               PT Long Term Goals - 05/29/21 1547       PT LONG TERM GOAL #1   Title Pt will be IND with final HEP in order to indicate dec fall risk and improved functional mobility.  (Target Date: 07/13/21)    Time 6    Period Weeks    Status New    Target Date 07/13/21      PT LONG TERM GOAL #2   Title Pt will improve gait speed to >/=3.25 ft/sec in order to indicate dec fall risk and improved efficiency of gait.    Time 6    Period Weeks    Status New      PT LONG TERM GOAL #3   Title Pt will perform 5TSS in </=11.5  secs without UE support in order to ind dec fall risk and improved functional strength.    Time 6    Period Weeks    Status New      PT LONG TERM GOAL #4   Title Pt will improve FGA to >/=24/30 in order to indicate dec fall risk.    Time 6    Period Weeks    Status New      PT LONG TERM GOAL #5   Title Pt will ambulate >1000' over varying outdoor surfaces at mod I level demonstrating ability to scan L environment/avoid obstacles on L in order to indicate safe community mobility.    Time 6    Period Weeks    Status New                   Plan - 06/26/21 1630     Clinical Impression Statement Pt returns for PT for 1st visit since evaluation due to multiple cancells for various reasons (illness, transportation or other appt's). STGs were due 06/19/21. Skilled session intially focused on progress toward goals with some goals met, most not met. [place] of session focused on establishment of an HEP for strengthening and balance. No issues reported or noted with performance in session. The pt is making steady progress toward goals and should benefit from continued PT to progress toward unmet goals.    Personal Factors and Comorbidities Comorbidity 3+    Comorbidities see above    Examination-Activity Limitations Locomotion Level;Squat;Stairs;Stand;Transfers    Examination-Participation Restrictions Community Activity;Driving;Yard Work    [employer] Evolving/Moderate complexity    Rehab Potential Good    PT Frequency 2x / week    PT Duration 6 weeks    PT Treatment/Interventions ADLs/Self Care Home Management;Aquatic Therapy;Gait training;Stair training;Functional mobility training;Therapeutic activities;Therapeutic exercise;Balance training;Neuromuscular re-education;Patient/family education;Passive range of motion;Vestibular;Visual/perceptual remediation/compensation    PT Next Visit Plan How is HEP going? focus on standing hip strength, hamstring strength,  DF, high level balance on compliant surfaces, head motion, EC    Consulted and Agree with Plan of Care Patient;Family member/caregiver    Family Member Consulted husband walter             Patient will benefit from skilled therapeutic intervention in order to improve the following deficits and impairments:  Abnormal gait, Decreased activity tolerance, Decreased balance, Decreased cognition, Decreased endurance, Decreased mobility, Decreased strength, Impaired perceived functional ability, Impaired sensation, Postural dysfunction  Visit Diagnosis: 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)  Unsteadiness on feet  Other abnormalities of gait and mobility     Problem List  Patient Active Problem List   Diagnosis Date Noted   Swelling of both lower extremities 05/08/2021   Frequent headaches 03/25/2021   Sciatica of left side 03/25/2021   Chronic low back pain 03/25/2021   Anxiety disorder 03/25/2021   Intraparenchymal hematoma of brain 02/27/2021   Stroke, 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[place]) 02/23/2021   Intracranial hemorrhage ([place]) 02/22/2021   Chemotherapy induced neutropenia ([place]) 01/08/2021   Bradycardia 01/01/2021   Hypomagnesemia 12/10/2020   Nausea without vomiting 12/10/2020   Status post thoracentesis    Pleural effusion, right 11/01/2020   Adenocarcinoma of right lung, stage 2 ([place]) 09/11/2020   Encounter for antineoplastic chemotherapy 09/11/2020   S/P Robotic Assited Video Thoracoscopy with Right Upper Lobectomy Lung 08/26/2020   Mediastinal adenopathy 07/18/2020   Lung nodules    Lung nodule 06/14/2020   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   Tobacco dependence 05/29/2020   Exertional chest pain 05/28/2020   Numbness and tingling of left arm and leg 07/01/2012   Hyperlipidemia with target low density lipoprotein (LDL) cholesterol less than 100 mg/dL 07/01/2012   Chest pressure 06/30/2012   GERD  (gastroesophageal reflux disease) 06/30/2012    Willow Ora, PTA, [facility] [address] [place], [place] [address] [phone] 06/27/21, 5:11 PM   Name: Tonya [name] MRN: [id number] Date of Birth: 1945/10/22

## 2021-07-01 ENCOUNTER — Encounter: Payer: Self-pay | Admitting: Rehabilitation

## 2021-07-01 ENCOUNTER — Encounter: Payer: Self-pay | Admitting: Occupational Therapy

## 2021-07-01 ENCOUNTER — Ambulatory Visit: Payer: Medicare HMO

## 2021-07-01 ENCOUNTER — Ambulatory Visit: Payer: Medicare HMO | Admitting: Occupational Therapy

## 2021-07-01 ENCOUNTER — Other Ambulatory Visit: Payer: Self-pay

## 2021-07-01 ENCOUNTER — Ambulatory Visit: Payer: Medicare HMO | Admitting: Rehabilitation

## 2021-07-01 DIAGNOSIS — M6281 Muscle weakness (generalized): Secondary | ICD-10-CM

## 2021-07-01 DIAGNOSIS — R41844 Frontal lobe and executive function deficit: Secondary | ICD-10-CM

## 2021-07-01 DIAGNOSIS — I69118 Other symptoms and signs 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: Secondary | ICD-10-CM

## 2021-07-01 DIAGNOSIS — R41841 Cognitive communication deficit: Secondary | ICD-10-CM

## 2021-07-01 DIAGNOSIS — I69154 Hemiplegia and hemiparesis following nontraumatic intracerebral hemorrhage affecting left non-dominant side: Secondary | ICD-10-CM

## 2021-07-01 DIAGNOSIS — R2681 Unsteadiness on feet: Secondary | ICD-10-CM

## 2021-07-01 DIAGNOSIS — R41840 Attention and concentration deficit: Secondary | ICD-10-CM

## 2021-07-01 DIAGNOSIS — R2689 Other abnormalities of gait and mobility: Secondary | ICD-10-CM

## 2021-07-01 DIAGNOSIS — R41842 Visuospatial deficit: Secondary | ICD-10-CM

## 2021-07-01 DIAGNOSIS — R278 Other lack of coordination: Secondary | ICD-10-CM

## 2021-07-01 NOTE — Patient Instructions (Signed)
Access Code: [email or web address] URL: [email or web address] Date: 07/01/2021 Prepared by: Cameron Sprang  Exercises Standing Single Leg Stance with Counter Support - 1 x daily - 5 x weekly - 1 sets - 3 reps - 15 seconds hold Standing Tandem Balance with Counter Support - 1 x daily - 5 x weekly - 1 sets - 3 reps - 15 hold Romberg Stance Eyes Closed on Foam Pad - 1 x daily - 7 x weekly - 1 sets - 3 reps - 20 secs hold Wide Stance with Eyes Closed and Head Nods on Foam Pad - 1 x daily - 7 x weekly - 1 sets - 10 reps Wide Stance with Eyes Closed and Head Rotation on Foam Pad - 1 x daily - 7 x weekly - 1 sets - 10 reps

## 2021-07-01 NOTE — Therapy (Signed)
[facility] [address] [place], [place], [address] Phone: [phone]   Fax:  [phone]  Occupational Therapy Treatment  Patient Details  Name: Tonya [name] MRN: [id number] Date of Birth: 03-31-1946 Referring Provider (OT): Dr. Alysia Penna   Encounter Date: 07/01/2021   OT End of Session - 07/01/21 0938     Visit Number 7    Number of Visits 17    Date for OT Re-Evaluation 07/28/21    Authorization Type [employer]; Out of pocket has been met in full, patient is covered 100%  Prior auth required--awaiting auth    Authorization - Visit Number 7    Authorization - Number of Visits 10    OT Start Time [phone]    OT Stop Time 1017    OT Time Calculation (min) 43 min    Equipment Utilized During Treatment FOTO completed but NP (score of functional intake measure of 91 for upper extremity; however, primary deficits are cognition, visual-perceptual, and balance)    Activity Tolerance Patient tolerated treatment well    Behavior During Therapy WFL for tasks assessed/performed             Past Medical History:  Diagnosis Date   Adenoma of left adrenal gland    Cancer ([place])    Lung   Colon polyps    hyperplastic   Complication of anesthesia    Very emotional and cries after anesthesia   GERD (gastroesophageal reflux disease)    High blood pressure 03/25/2021   Kidney stone    Liver lesion    Microhematuria     Past Surgical History:  Procedure Laterality Date   ABDOMINAL HYSTERECTOMY     APPENDECTOMY     BRONCHIAL BIOPSY  07/18/2020   Procedure: BRONCHIAL BIOPSIES;  Surgeon: [name], DO;  Location: [facility];  Service: Pulmonary;;   BRONCHIAL BRUSHINGS  07/18/2020   Procedure: BRONCHIAL BRUSHINGS;  Surgeon: [name], DO;  Location: [facility];  Service: Pulmonary;;   BRONCHIAL NEEDLE ASPIRATION BIOPSY  07/18/2020   Procedure: BRONCHIAL NEEDLE ASPIRATION BIOPSIES;   Surgeon: [name], DO;  Location: [facility];  Service: Pulmonary;;   BRONCHIAL WASHINGS  07/18/2020   Procedure: BRONCHIAL WASHINGS;  Surgeon: [name], DO;  Location: [facility];  Service: Pulmonary;;   FIDUCIAL MARKER PLACEMENT  07/18/2020   Procedure: FIDUCIAL MARKER PLACEMENT;  Surgeon: [name], DO;  Location: [facility];  Service: Pulmonary;;   INTERCOSTAL NERVE BLOCK Right 08/26/2020   Procedure: INTERCOSTAL NERVE BLOCK;  Surgeon: [name], MD;  Location: [facility];  Service: Thoracic;  Laterality: Right;   IR THORACENTESIS ASP PLEURAL SPACE W/IMG GUIDE  10/10/2020   NODE DISSECTION Right 08/26/2020   Procedure: NODE DISSECTION;  Surgeon: [name], MD;  Location: [facility];  Service: Thoracic;  Laterality: Right;   THORACENTESIS Right 11/04/2020   Procedure: Mathews Robinsons;  Surgeon: [name], DO;  Location: [facility];  Service: Pulmonary;  Laterality: Right;   THORACENTESIS Right 11/29/2020   Procedure: THORACENTESIS;  Surgeon: [name], DO;  Location: [facility];  Service: Pulmonary;  Laterality: Right;   VIDEO BRONCHOSCOPY WITH ENDOBRONCHIAL NAVIGATION N/A 07/18/2020   Procedure: VIDEO BRONCHOSCOPY WITH ENDOBRONCHIAL NAVIGATION;  Surgeon: [name], DO;  Location: [facility];  Service: Pulmonary;  Laterality: N/A;   VIDEO BRONCHOSCOPY WITH ENDOBRONCHIAL ULTRASOUND  07/18/2020   Procedure: VIDEO BRONCHOSCOPY WITH ENDOBRONCHIAL ULTRASOUND;  Surgeon: [name], DO;  Location: [facility];  Service: Pulmonary;;    There were no vitals filed for this visit.   Subjective Assessment - 07/01/21 0935     Subjective  practicing wider walking steps, which is starting to feel more natural.  Pt expresses concerns with not knowing how to improve at home.  Should I be using machines?  What is different than what I do at home?    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    PMH includes: lung cancer s/p  R upper lobectomy, currently undergoing chemo, GERD, HLD, bradycardia    Limitations fall risk, L inattention    Patient Stated Goals be indendepent and return to driving    Currently in Pain? Yes    Pain Score 4     Pain Location Flank    Pain Orientation Right    Pain Descriptors / Indicators Aching    Pain Type Acute pain    Pain Onset In the past 7 days    Pain Frequency Intermittent    Aggravating Factors  prolonged sitting and/or walking    Pain Relieving Factors pain meds help some               Constant Therapy--Alternating Symbol Match (for alternating attention):  level 5 with 90% accuracy and 225 sec average response time.  Pt needed mod cueing for directions initially.    Environmental scanning in min distracting environment with approx 87% accuracy.  Remaining found on 2nd pass.  Long discussion regarding cognitive and visual-perceptual deficits and verbal review of HEP/recommendations for home (including functional reaching, coordination HEP, visual scanning).  Pt/husband verbalized understanding, but would benefit from reinforcement.         OT Short Term Goals - 07/01/21 1507       OT SHORT TERM GOAL #1   Title Pt will verbalize understanding of visual compensation strategies for ADLs/IADLs for incr ease/safety.--check STGs 06/28/21    Time 4    Period Weeks    Status On-going   07/01/21:  needs reinforcement/cues     OT SHORT TERM GOAL #2   Title Pt will perform simple environmental scanning/navigation with at least 90% accuracy and no LOB.    Time 4    Period Weeks    Status On-going   07/01/21:  87%, pt reports that she continues to bump into objects on the L at home     OT [place] #3   Title Pt will perform divided attention between at least 1 cognitive and 1 physical task with at least 75% accuracy.    Time 4    Period Weeks    Status On-going   07/01/21:  not met     OT SHORT TERM GOAL #4   Title Pt will be independent with LUE HEP  for mild coordination/strength deficits.    Period Weeks    Status On-going   07/01/21:  needs review              OT Long Term Goals - 05/29/21 1521       OT LONG TERM GOAL #1   Title Pt will verbalize understanding of memory/cognitive compensation strategies for ADLs/IADLs for incr ease/safety.--check LTGs 07/28/21    Time 8    Period Weeks    Status New      OT LONG TERM GOAL #2   Title Pt will perform environmental scanning and navigation in busy, dynamic environment with at least 95% accuracy.    Time  8    Period Weeks    Status New      OT LONG TERM GOAL #3   Title Pt will perform divided attention between at least 1 cognitive and 1 physical task with at least 90% accuracy.    Time 8    Period Weeks    Status New      OT LONG TERM GOAL #4   Title Pt will be independent with HEP for visual scanning/cognition.    Time 8    Period Weeks    Status New                   Plan - 07/01/21 0939     Clinical Impression Statement Pt with decr emergent and anticipatory awareness of cognitive and perceptual deficits.  Pt demo difficulty with alternating attention and incr time for visual scanning;    OT Occupational Profile and History Detailed Assessment- Review of Records and additional review of physical, cognitive, psychosocial history related to current functional performance    Occupational performance deficits (Please refer to evaluation for details): ADL's;IADL's;Leisure    Body Structure / Function / Physical Skills ADL;UE functional use;IADL;Balance;Dexterity;Coordination;FMC;Strength    Cognitive Skills Memory;Attention;Problem Solve;Safety Awareness;Sequencing    Rehab Potential Good    Clinical Decision Making Several treatment options, min-mod task modification necessary    Comorbidities Affecting Occupational Performance: May have comorbidities impacting occupational performance    Modification or Assistance to Complete Evaluation  Min-Moderate  modification of tasks or assist with assess necessary to complete eval    OT Frequency 2x / week    OT Duration 8 weeks   +eval   OT Treatment/Interventions Self-care/ADL training;DME and/or AE instruction;Balance training;Therapeutic activities;Cognitive remediation/compensation;Therapeutic exercise;Neuromuscular education;Functional Mobility Training;Visual/perceptual remediation/compensation;Patient/family education    Plan continue with environmental scanning, alternating-divided attention (added OT on 11/10--inform/check with pt, and add OT through 11/21?)    Consulted and Agree with Plan of Care Patient;Family member/caregiver    Family Member Consulted husband             Patient will benefit from skilled therapeutic intervention in order to improve the following deficits and impairments:   Body Structure / Function / Physical Skills: ADL, UE functional use, IADL, Balance, Dexterity, Coordination, FMC, Strength Cognitive Skills: Memory, Attention, Problem Solve, Safety Awareness, Sequencing     Visit Diagnosis: Attention and concentration deficit  Frontal lobe and executive function deficit  Visuospatial deficit  Other lack of coordination  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)  Other symptoms and signs 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  Unsteadiness on feet    Problem List Patient Active Problem List   Diagnosis Date Noted   Swelling of both lower extremities 05/08/2021   Frequent headaches 03/25/2021   Sciatica of left side 03/25/2021   Chronic low back pain 03/25/2021   Anxiety disorder 03/25/2021   Intraparenchymal hematoma of brain 02/27/2021   Stroke, 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[place]) 02/23/2021   Intracranial hemorrhage ([place]) 02/22/2021   Chemotherapy induced neutropenia ([place]) 01/08/2021   Bradycardia 01/01/2021   Hypomagnesemia 12/10/2020   Nausea  without vomiting 12/10/2020   Status post thoracentesis    Pleural effusion, right 11/01/2020   Adenocarcinoma of right lung, stage 2 ([place]) 09/11/2020   Encounter for antineoplastic chemotherapy 09/11/2020   S/P Robotic Assited Video Thoracoscopy with Right Upper Lobectomy Lung 08/26/2020   Mediastinal adenopathy 07/18/2020   Lung nodules    Lung nodule 06/14/2020   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  Tobacco dependence 05/29/2020   Exertional chest pain 05/28/2020   Numbness and tingling of left arm and leg 07/01/2012   Hyperlipidemia with target low density lipoprotein (LDL) cholesterol less than 100 mg/dL 07/01/2012   Chest pressure 06/30/2012   GERD (gastroesophageal reflux disease) 06/30/2012    [facility], OT/L 07/01/2021, 3:11 PM  [facility] [address] [place], [place], [address] Phone: [phone]   Fax:  [phone]  Name: TASCHA CASARES MRN: [id number] Date of Birth: [birth year]  Vianne Bulls, OTR/L [facility] [address] [place], [place]  [address] [phone] phone [phone] 07/01/21 3:11 PM

## 2021-07-01 NOTE — Therapy (Signed)
[facility] [address] [place], [place], [address] Phone: [phone]   Fax:  [phone]  Speech Language Pathology Treatment  Patient Details  Name: Tonya [name] MRN: [id number] Date of Birth: 05-28-1946 Referring Provider (SLP): Charlett Blake, MD   Encounter Date: 07/01/2021   End of Session - 07/01/21 1408     Visit Number 5    Number of Visits 17    Date for SLP Re-Evaluation 07/25/21    Authorization Type [employer]    SLP Start Time 1105    SLP Stop Time  1147    SLP Time Calculation (min) 42 min    Activity Tolerance Patient tolerated treatment well             Past Medical History:  Diagnosis Date   Adenoma of left adrenal gland    [place]cer ([place])    Lung   Colon polyps    hyperplastic   Complication of anesthesia    Very emotional and cries after anesthesia   GERD (gastroesophageal reflux disease)    High blood pressure 03/25/2021   Kidney stone    Liver lesion    Microhematuria     Past Surgical History:  Procedure Laterality Date   ABDOMINAL HYSTERECTOMY     APPENDECTOMY     BRONCHIAL BIOPSY  07/18/2020   Procedure: BRONCHIAL BIOPSIES;  Surgeon: [name], DO;  Location: [facility];  Service: Pulmonary;;   BRONCHIAL BRUSHINGS  07/18/2020   Procedure: BRONCHIAL BRUSHINGS;  Surgeon: [name], DO;  Location: [facility];  Service: Pulmonary;;   BRONCHIAL NEEDLE ASPIRATION BIOPSY  07/18/2020   Procedure: BRONCHIAL NEEDLE ASPIRATION BIOPSIES;  Surgeon: [name], DO;  Location: [facility];  Service: Pulmonary;;   BRONCHIAL WASHINGS  07/18/2020   Procedure: BRONCHIAL WASHINGS;  Surgeon: [name], DO;  Location: [facility];  Service: Pulmonary;;   FIDUCIAL MARKER PLACEMENT  07/18/2020   Procedure: FIDUCIAL MARKER PLACEMENT;  Surgeon: [name], DO;  Location: [facility];  Service: Pulmonary;;   INTERCOSTAL NERVE BLOCK Right  08/26/2020   Procedure: INTERCOSTAL NERVE BLOCK;  Surgeon: [name], MD;  Location: [facility];  Service: Thoracic;  Laterality: Right;   IR THORACENTESIS ASP PLEURAL SPACE W/IMG GUIDE  10/10/2020   NODE DISSECTION Right 08/26/2020   Procedure: NODE DISSECTION;  Surgeon: [name], MD;  Location: [facility];  Service: Thoracic;  Laterality: Right;   THORACENTESIS Right 11/04/2020   Procedure: Mathews Robinsons;  Surgeon: [name], DO;  Location: [facility];  Service: Pulmonary;  Laterality: Right;   THORACENTESIS Right 11/29/2020   Procedure: THORACENTESIS;  Surgeon: [name], DO;  Location: [facility];  Service: Pulmonary;  Laterality: Right;   VIDEO BRONCHOSCOPY WITH ENDOBRONCHIAL NAVIGATION N/A 07/18/2020   Procedure: VIDEO BRONCHOSCOPY WITH ENDOBRONCHIAL NAVIGATION;  Surgeon: [name], DO;  Location: [facility];  Service: Pulmonary;  Laterality: N/A;   VIDEO BRONCHOSCOPY WITH ENDOBRONCHIAL ULTRASOUND  07/18/2020   Procedure: VIDEO BRONCHOSCOPY WITH ENDOBRONCHIAL ULTRASOUND;  Surgeon: [name], DO;  Location: [facility];  Service: Pulmonary;;    There were no vitals filed for this visit.   Subjective Assessment - 07/01/21 1403     Subjective "busy"    Patient is accompained by: Family member    Currently in Pain? Yes    Pain Score 4     Pain Location Flank    Pain Orientation Right  ADULT SLP TREATMENT - 07/01/21 1403       General Information   Behavior/Cognition Alert;Cooperative;Pleasant mood      Treatment Provided   Treatment provided Cognitive-Linquistic      Cognitive-Linquistic Treatment   Treatment focused on Cognition;Patient/family/caregiver education    Skilled Treatment SLP reviewed previous recommendations, including increased pausing to aid thought processing. Pt endorsed she is not as organized as she was prior to stroke. SLP provided recommendations to optimize organization system at  home. Pt is beginning to manage previous tasks with more independence with multipe checkpoints to ensure accuracy. Pt is returning to cooking and following recipes without error. SLP engaged patient and husband in discussion re: recovery process and management of expectations, as pt reports some pressure and expectation of husband to be at baseline.      Assessment / Recommendations / Plan   Plan Continue with current plan of care      Progression Toward Goals   Progression toward goals Progressing toward goals              SLP Education - 07/01/21 1408     Education Details functional recommendations    Person(s) Educated Patient;Spouse    Methods Explanation;Demonstration;Handout    Comprehension Verbalized understanding;Returned demonstration;Need further instruction              SLP Short Term Goals - 07/01/21 1409       SLP SHORT TERM GOAL #1   Title Pt will use memory compensations for appointments, finances, and other daily activities with rare min A  over 2 sessions    Baseline 06-23-21, 07-01-21    Time 4    Period Weeks    Status Achieved    Target Date 07/15/21   extended as pt started tx two weeks after eval     SLP SHORT TERM GOAL #2   Title Pt will use verbalize and demo accurate problem solving for functional scenarios encountered at home with rare min A over 2 sessions    Baseline 07-01-21    Time 4    Period Weeks    Status On-going    Target Date 07/15/21      SLP SHORT TERM GOAL #3   Title Pt will ID and correct errors on structured cognitive tasks with 80% accuracy given rare min A over 2 sessions    Time 4    Period Weeks    Status On-going    Target Date 07/15/21      SLP SHORT TERM GOAL #4   Title Pt will implement strategy to ensure stove is turned off after cooking for 3/3 opportunities    Baseline 06-23-21, 07-01-21    Time 4    Period Weeks    Status On-going    Target Date 07/15/21      SLP SHORT TERM GOAL #5   Title Pt will  complete cognitive PROM in first few sessions    Status Achieved    Target Date 07/15/21              SLP Long Term Goals - 07/01/21 1410       SLP LONG TERM GOAL #1   Title Pt will independently manage appointments and bill pay with use of compensations with no errors reported for 3/3 opportunities    Time 6    Period Weeks    Status On-going    Target Date 07/25/21   extended as pt started POC 2 weeks after eval  SLP LONG TERM GOAL #2   Title Pt will use independently demo accurate problem solving for functional scenarios encountered at home over 2 sessions    Time 8    Period Weeks    Status On-going    Target Date 07/25/21      SLP LONG TERM GOAL #3   Title Pt will report improved cognitive linguistic functioning on PROM by 5 points at last ST session    Time 8    Period Weeks    Status On-going    Target Date 07/25/21              Plan - 07/01/21 1408     Clinical Impression Statement "Merlie" was referred for OPST to address cognition secondary to 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 Pt was accompanied by her husband, [name]. Less dependence on husband for recall reported. SLP targeted functional application of cognitive skills and recommendations to increase functional independence and continue to reduce reliance on husband with supervision as needed. Given change in baseline cognition, SLP recommends skilled ST intervention to address cognitive linguistic skills to maximize return to PLOF and increase functional independence.    Speech Therapy Frequency 2x / week    Duration 8 weeks    Treatment/Interventions Patient/family education;Functional tasks;Cognitive reorganization;Compensatory techniques;Internal/external aids;SLP instruction and feedback;Environmental controls;Cueing hierarchy;Compensatory strategies    Potential to Achieve Goals Good    SLP Home Exercise Plan provided    Consulted and Agree with Plan of Care Patient;Family member/caregiver              Patient will benefit from skilled therapeutic intervention in order to improve the following deficits and impairments:   Cognitive communication deficit    Problem List Patient Active Problem List   Diagnosis Date Noted   Swelling of both lower extremities 05/08/2021   Frequent headaches 03/25/2021   Sciatica of left side 03/25/2021   Chronic low back pain 03/25/2021   Anxiety disorder 03/25/2021   Intraparenchymal hematoma of brain 02/27/2021   Stroke, 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[place]) 02/23/2021   Intracranial hemorrhage ([place]) 02/22/2021   Chemotherapy induced neutropenia ([place]) 01/08/2021   Bradycardia 01/01/2021   Hypomagnesemia 12/10/2020   Nausea without vomiting 12/10/2020   Status post thoracentesis    Pleural effusion, right 11/01/2020   Adenocarcinoma of right lung, stage 2 ([place]) 09/11/2020   Encounter for antineoplastic chemotherapy 09/11/2020   S/P Robotic Assited Video Thoracoscopy with Right Upper Lobectomy Lung 08/26/2020   Mediastinal adenopathy 07/18/2020   Lung nodules    Lung nodule 06/14/2020   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   Tobacco dependence 05/29/2020   Exertional chest pain 05/28/2020   Numbness and tingling of left arm and leg 07/01/2012   Hyperlipidemia with target low density lipoprotein (LDL) cholesterol less than 100 mg/dL 07/01/2012   Chest pressure 06/30/2012   GERD (gastroesophageal reflux disease) 06/30/2012    Alinda Deem, MA CCC-SLP 07/01/2021, 2:14 PM  [facility] [address] [place], [place], [address] Phone: [phone]   Fax:  [phone]   Name: DEVINA BEZOLD MRN: [id number] Date of Birth: 19-May-1946

## 2021-07-01 NOTE — Therapy (Signed)
[name] [address] [place], [place], [address] Phone: [phone]   Fax:  [phone]  Physical Therapy Treatment  Patient Details  Name: Tonya [name] MRN: [id number] Date of Birth: 12-20-1945 No data recorded  Encounter Date: 07/01/2021   PT End of Session - 07/01/21 1347     Visit Number 3    Number of Visits 13    Date for PT Re-Evaluation 07/13/21    Authorization Type [employer] Medicare- waiting for [place] - Visit Number 2    Progress Note Due on Visit 10    PT Start Time 1018    PT Stop Time 1059    PT Time Calculation (min) 41 min    Equipment Utilized During Treatment Gait belt    Activity Tolerance Patient tolerated treatment well    Behavior During Therapy [facility] for tasks assessed/performed             Past Medical History:  Diagnosis Date   Adenoma of left adrenal gland    Cancer ([place])    Lung   Colon polyps    hyperplastic   Complication of anesthesia    Very emotional and cries after anesthesia   GERD (gastroesophageal reflux disease)    High blood pressure 03/25/2021   Kidney stone    Liver lesion    Microhematuria     Past Surgical History:  Procedure Laterality Date   ABDOMINAL HYSTERECTOMY     APPENDECTOMY     BRONCHIAL BIOPSY  07/18/2020   Procedure: BRONCHIAL BIOPSIES;  Surgeon: [name], DO;  Location: [place] [facility];  Service: Pulmonary;;   BRONCHIAL BRUSHINGS  07/18/2020   Procedure: BRONCHIAL BRUSHINGS;  Surgeon: [name], DO;  Location: [place];  Service: Pulmonary;;   BRONCHIAL NEEDLE ASPIRATION BIOPSY  07/18/2020   Procedure: BRONCHIAL NEEDLE ASPIRATION BIOPSIES;  Surgeon: [name], DO;  Location: [place] [facility];  Service: Pulmonary;;   BRONCHIAL WASHINGS  07/18/2020   Procedure: BRONCHIAL WASHINGS;  Surgeon: [name], DO;  Location: [name];  Service: Pulmonary;;   FIDUCIAL MARKER PLACEMENT  07/18/2020    Procedure: FIDUCIAL MARKER PLACEMENT;  Surgeon: [name], DO;  Location: [place] [facility];  Service: Pulmonary;;   INTERCOSTAL NERVE BLOCK Right 08/26/2020   Procedure: INTERCOSTAL NERVE BLOCK;  Surgeon: [name], MD;  Location: [place];  Service: Thoracic;  Laterality: Right;   IR THORACENTESIS ASP PLEURAL SPACE W/IMG GUIDE  10/10/2020   NODE DISSECTION Right 08/26/2020   Procedure: NODE DISSECTION;  Surgeon: [name], MD;  Location: [place];  Service: Thoracic;  Laterality: Right;   THORACENTESIS Right 11/04/2020   Procedure: Mathews Robinsons;  Surgeon: [name], DO;  Location: [place] [facility];  Service: Pulmonary;  Laterality: Right;   THORACENTESIS Right 11/29/2020   Procedure: THORACENTESIS;  Surgeon: [name], DO;  Location: [facility];  Service: Pulmonary;  Laterality: Right;   VIDEO BRONCHOSCOPY WITH ENDOBRONCHIAL NAVIGATION N/A 07/18/2020   Procedure: VIDEO BRONCHOSCOPY WITH ENDOBRONCHIAL NAVIGATION;  Surgeon: [name], DO;  Location: [place];  Service: Pulmonary;  Laterality: N/A;   VIDEO BRONCHOSCOPY WITH ENDOBRONCHIAL ULTRASOUND  07/18/2020   Procedure: VIDEO BRONCHOSCOPY WITH ENDOBRONCHIAL ULTRASOUND;  Surgeon: [name], DO;  Location: [place] [facility];  Service: Pulmonary;;    There were no vitals filed for this visit.   Subjective Assessment - 07/01/21 1339     Subjective Pt reports having a hard time understanding what therapies are doing  to carryover to improvement in functional mobility.  Spent a lot of time in session to ensure she felt comfortable and knew why HEP exercises were related to improving functional balance.    Patient is accompained by: Family member    Pertinent History HTN, lung cancer, GERD, HLD    Limitations Walking;House hold activities;Standing    Patient Stated Goals "I want to improve my balance"    Currently in Pain? Yes    Pain Score 4     Pain Location Flank    Pain Orientation Right    Pain  Descriptors / Indicators Aching    Pain Type Acute pain    Pain Onset In the past 7 days    Pain Frequency Intermittent    Aggravating Factors  prolonged sitting and or walking    Pain Relieving Factors pain meds help some                               [facility] Adult PT Treatment/Exercise - 07/01/21 1341       Transfers   Transfers Sit to Stand;Stand to Sit    Sit to Stand 6: Modified independent (Device/Increase time)    Stand to Sit 6: Modified independent (Device/Increase time)      Ambulation/Gait   Ambulation/Gait Yes    Ambulation/Gait Assistance 5: Supervision    Ambulation/Gait Assistance Details Around session in therapy gym without device with cues for posture (relaxing shoulders) and increasing stride length.    Ambulation Distance (Feet) 300 Feet    Assistive device None    Gait Pattern Step-through pattern;Decreased stride length;Left foot flat;Trunk flexed;Narrow base of support;Poor foot clearance - left;Poor foot clearance - right    Ambulation Surface Level;Indoor      Neuro Re-ed    Neuro Re-ed Details  Reviewed pts HEP as she was concerned that it was "too difficult" and had questions regarding technique.  PT reviewed SLS which was done very well and educated on light UE support as able and progressing to no UE support.  Also note that she is performing tandem gait rather than stance, but she reports this is VERY difficult.  When PT assessed in session, with min cues to lightly tough UE to counter, she did very well (still a mild to moderate challenge and appropriate for HEP).  Note that corner balance on solid ground were too easy at this time (educated that she has made progress!) and added corner balance on pillow to challenge vestibular system.  Pt and husband verbalized understanding. See pt instructions for details on HEP.               Access Code: [email or web address] URL: [email or web address] Date: 07/01/2021 Prepared by: Cameron Sprang   Exercises Standing Single Leg Stance with Counter Support - 1 x daily - 5 x weekly - 1 sets - 3 reps - 15 seconds hold Standing Tandem Balance with Counter Support - 1 x daily - 5 x weekly - 1 sets - 3 reps - 15 hold Romberg Stance Eyes Closed on Foam Pad - 1 x daily - 7 x weekly - 1 sets - 3 reps - 20 secs hold Wide Stance with Eyes Closed and Head Nods on Foam Pad - 1 x daily - 7 x weekly - 1 sets - 10 reps Wide Stance with Eyes Closed and Head Rotation on Foam Pad - 1 x daily - 7 x weekly - 1 sets -  10 reps      PT Education - 07/01/21 1347     Education Details updated HEP    Person(s) Educated Patient;Spouse    Methods Explanation;Demonstration;Handout    Comprehension Verbalized understanding;Returned demonstration              PT Short Term Goals - 06/26/21 1653       PT SHORT TERM GOAL #1   Title Pt will be IND with initial HEP in order to indicate improved functional mobility and dec fall risk.  (Target Date:06/19/21)    Baseline 06/26/21: HEP just issued this session    Status Not Met      PT SHORT TERM GOAL #2   Title Pt will improve gait speed to >/=2.65 ft/sec in order to indicate safe community ambulation.    Baseline 06/26/21: 2.55 ft/sec no AD, improved just not to goal level    Status Partially Met      PT SHORT TERM GOAL #3   Title Pt will improve 5TSS to </=14 secs without UE support in order to ind dec fall risk and improved functional strength.    Baseline 06/26/21: 13.94 sec's no UE support from standard height surface    Status Achieved      PT SHORT TERM GOAL #4   Title Pt will improve FGA to >/=20/30  in order to indicate dec fall risk.    Baseline 06/26/21: 17/30 scored today, improved by 1 point from last assessment    Status Not Met      PT SHORT TERM GOAL #5   Title Will assess cognitive TUG and write appropriate LTG to reflect dec fall risk    Baseline 06/26/21: 13.82 sec's no AD while counting backwards from 3. PT to set  goals.    Time --    Period --    Status Achieved               PT Long Term Goals - 05/29/21 1547       PT LONG TERM GOAL #1   Title Pt will be IND with final HEP in order to indicate dec fall risk and improved functional mobility.  (Target Date: 07/13/21)    Time 6    Period Weeks    Status New    Target Date 07/13/21      PT LONG TERM GOAL #2   Title Pt will improve gait speed to >/=3.25 ft/sec in order to indicate dec fall risk and improved efficiency of gait.    Time 6    Period Weeks    Status New      PT LONG TERM GOAL #3   Title Pt will perform 5TSS in </=11.5 secs without UE support in order to ind dec fall risk and improved functional strength.    Time 6    Period Weeks    Status New      PT LONG TERM GOAL #4   Title Pt will improve FGA to >/=24/30 in order to indicate dec fall risk.    Time 6    Period Weeks    Status New      PT LONG TERM GOAL #5   Title Pt will ambulate >1000' over varying outdoor surfaces at mod I level demonstrating ability to scan L environment/avoid obstacles on L in order to indicate safe community mobility.    Time 6    Period Weeks    Status New  Plan - 07/01/21 1347     Clinical Impression Statement Skilled session focused on answering questions regarding progress with HEP and how it relates to functional mobility and balance.  Updated HEP today to reflect progress with balance.  Pt and husband verbalized understanding.    Personal Factors and Comorbidities Comorbidity 3+    Comorbidities see above    Examination-Activity Limitations Locomotion Level;Squat;Stairs;Stand;Transfers    Examination-Participation Restrictions Community Activity;Driving;Yard Work    Merchant navy officer Evolving/Moderate complexity    Rehab Potential Good    PT Frequency 2x / week    PT Duration 6 weeks    PT Treatment/Interventions ADLs/Self Care Home Management;Aquatic Therapy;Gait training;Stair  training;Functional mobility training;Therapeutic activities;Therapeutic exercise;Balance training;Neuromuscular re-education;Patient/family education;Passive range of motion;Vestibular;Visual/perceptual remediation/compensation    PT Next Visit Plan How is HEP going? focus on standing hip strength, hamstring strength, DF, high level balance on compliant surfaces, head motion, EC    Consulted and Agree with Plan of Care Patient;Family member/caregiver    Family Member Consulted husband walter             Patient will benefit from skilled therapeutic intervention in order to improve the following deficits and impairments:  Abnormal gait, Decreased activity tolerance, Decreased balance, Decreased cognition, Decreased endurance, Decreased mobility, Decreased strength, Impaired perceived functional ability, Impaired sensation, Postural dysfunction  Visit Diagnosis: 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)  Unsteadiness on feet  Other abnormalities of gait and mobility  Muscle weakness (generalized)     Problem List Patient Active Problem List   Diagnosis Date Noted   Swelling of both lower extremities 05/08/2021   Frequent headaches 03/25/2021   Sciatica of left side 03/25/2021   Chronic low back pain 03/25/2021   Anxiety disorder 03/25/2021   Intraparenchymal hematoma of brain 02/27/2021   Stroke, 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[place]) 02/23/2021   Intracranial hemorrhage ([place]) 02/22/2021   Chemotherapy induced neutropenia ([place]) 01/08/2021   Bradycardia 01/01/2021   Hypomagnesemia 12/10/2020   Nausea without vomiting 12/10/2020   Status post thoracentesis    Pleural effusion, right 11/01/2020   Adenocarcinoma of right lung, stage 2 ([place]) 09/11/2020   Encounter for antineoplastic chemotherapy 09/11/2020   S/P Robotic Assited Video Thoracoscopy with Right Upper Lobectomy Lung 08/26/2020   Mediastinal adenopathy 07/18/2020    Lung nodules    Lung nodule 06/14/2020   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   Tobacco dependence 05/29/2020   Exertional chest pain 05/28/2020   Numbness and tingling of left arm and leg 07/01/2012   Hyperlipidemia with target low density lipoprotein (LDL) cholesterol less than 100 mg/dL 07/01/2012   Chest pressure 06/30/2012   GERD (gastroesophageal reflux disease) 06/30/2012    Cameron Sprang, PT, MPT [facility] [address] [place], [place], [address] Phone: [phone]   Fax:  [phone] 07/01/21, 1:50 PM   Name: XZANDRIA CLEVINGER MRN: [id number] Date of Birth: August 03, 1946

## 2021-07-03 ENCOUNTER — Ambulatory Visit: Payer: Medicare HMO | Admitting: Rehabilitation

## 2021-07-03 ENCOUNTER — Ambulatory Visit: Payer: Medicare HMO | Admitting: Occupational Therapy

## 2021-07-03 ENCOUNTER — Ambulatory Visit: Payer: Medicare HMO

## 2021-07-03 ENCOUNTER — Encounter: Payer: Self-pay | Admitting: Occupational Therapy

## 2021-07-03 ENCOUNTER — Encounter: Payer: Self-pay | Admitting: Rehabilitation

## 2021-07-03 ENCOUNTER — Other Ambulatory Visit: Payer: Self-pay

## 2021-07-03 DIAGNOSIS — R2681 Unsteadiness on feet: Secondary | ICD-10-CM | POA: Diagnosis not present

## 2021-07-03 DIAGNOSIS — R2689 Other abnormalities of gait and mobility: Secondary | ICD-10-CM

## 2021-07-03 DIAGNOSIS — M6281 Muscle weakness (generalized): Secondary | ICD-10-CM

## 2021-07-03 DIAGNOSIS — I69154 Hemiplegia and hemiparesis following nontraumatic intracerebral hemorrhage affecting left non-dominant side: Secondary | ICD-10-CM

## 2021-07-03 DIAGNOSIS — R41842 Visuospatial deficit: Secondary | ICD-10-CM

## 2021-07-03 DIAGNOSIS — R41841 Cognitive communication deficit: Secondary | ICD-10-CM

## 2021-07-03 DIAGNOSIS — R278 Other lack of coordination: Secondary | ICD-10-CM

## 2021-07-03 DIAGNOSIS — R41840 Attention and concentration deficit: Secondary | ICD-10-CM

## 2021-07-03 DIAGNOSIS — I69118 Other symptoms and signs 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: Secondary | ICD-10-CM

## 2021-07-03 DIAGNOSIS — R41844 Frontal lobe and executive function deficit: Secondary | ICD-10-CM

## 2021-07-03 NOTE — Therapy (Signed)
[facility] [address] [place], [place], [address] Phone: [phone]   Fax:  [phone]  Physical Therapy Treatment  Patient Details  Name: Tonya [name] MRN: [id number] Date of Birth: 24-Jul-1946 No data recorded  Encounter Date: 07/03/2021   PT End of Session - 07/03/21 1323     Visit Number 4    Date for PT Re-Evaluation 07/13/21    Authorization Type [employer] Medicare- waiting for [place] - Visit Number 4    Progress Note Due on Visit 10    PT Start Time 1106   pt late from previous session   PT Stop Time 1148    PT Time Calculation (min) 42 min    Activity Tolerance Patient tolerated treatment well    Behavior During Therapy [facility] for tasks assessed/performed             Past Medical History:  Diagnosis Date   Adenoma of left adrenal gland    Cancer ([place])    Lung   Colon polyps    hyperplastic   Complication of anesthesia    Very emotional and cries after anesthesia   GERD (gastroesophageal reflux disease)    High blood pressure 03/25/2021   Kidney stone    Liver lesion    Microhematuria     Past Surgical History:  Procedure Laterality Date   ABDOMINAL HYSTERECTOMY     APPENDECTOMY     BRONCHIAL BIOPSY  07/18/2020   Procedure: BRONCHIAL BIOPSIES;  Surgeon: [name], DO;  Location: [place] [facility];  Service: Pulmonary;;   BRONCHIAL BRUSHINGS  07/18/2020   Procedure: BRONCHIAL BRUSHINGS;  Surgeon: [name], DO;  Location: [place];  Service: Pulmonary;;   BRONCHIAL NEEDLE ASPIRATION BIOPSY  07/18/2020   Procedure: BRONCHIAL NEEDLE ASPIRATION BIOPSIES;  Surgeon: [name], DO;  Location: [place] [facility];  Service: Pulmonary;;   BRONCHIAL WASHINGS  07/18/2020   Procedure: BRONCHIAL WASHINGS;  Surgeon: [name], DO;  Location: [place] [facility];  Service: Pulmonary;;   FIDUCIAL MARKER PLACEMENT  07/18/2020   Procedure: FIDUCIAL MARKER PLACEMENT;   Surgeon: [name], DO;  Location: [place] [facility];  Service: Pulmonary;;   INTERCOSTAL NERVE BLOCK Right 08/26/2020   Procedure: INTERCOSTAL NERVE BLOCK;  Surgeon: [name], MD;  Location: [place];  Service: Thoracic;  Laterality: Right;   IR THORACENTESIS ASP PLEURAL SPACE W/IMG GUIDE  10/10/2020   NODE DISSECTION Right 08/26/2020   Procedure: NODE DISSECTION;  Surgeon: [name], MD;  Location: [place];  Service: Thoracic;  Laterality: Right;   THORACENTESIS Right 11/04/2020   Procedure: Mathews Robinsons;  Surgeon: [name], DO;  Location: [place] [facility];  Service: Pulmonary;  Laterality: Right;   THORACENTESIS Right 11/29/2020   Procedure: THORACENTESIS;  Surgeon: [name], DO;  Location: [facility];  Service: Pulmonary;  Laterality: Right;   VIDEO BRONCHOSCOPY WITH ENDOBRONCHIAL NAVIGATION N/A 07/18/2020   Procedure: VIDEO BRONCHOSCOPY WITH ENDOBRONCHIAL NAVIGATION;  Surgeon: [name], DO;  Location: [place];  Service: Pulmonary;  Laterality: N/A;   VIDEO BRONCHOSCOPY WITH ENDOBRONCHIAL ULTRASOUND  07/18/2020   Procedure: VIDEO BRONCHOSCOPY WITH ENDOBRONCHIAL ULTRASOUND;  Surgeon: [name], DO;  Location: [place] [facility];  Service: Pulmonary;;    There were no vitals filed for this visit.   Subjective Assessment - 07/03/21 1109     Subjective Continues to report R flank pain. No other symptoms (no urinary symptoms)    Pertinent History HTN, lung cancer, GERD,  HLD    Limitations Walking;House hold activities;Standing    Currently in Pain? Yes    Pain Score 4     Pain Location Flank    Pain Orientation Right    Pain Descriptors / Indicators Aching    Pain Type Acute pain    Pain Onset In the past 7 days    Pain Frequency Constant    Aggravating Factors  nothing    Pain Relieving Factors nothing                               [facility] Adult PT Treatment/Exercise - 07/03/21 1136       Neuro Re-ed    Neuro Re-ed  Details  High level balance with emphasis on multi sensory balance challenges, SLS, and scanning L.  In // bars standing on foam airex with feet apart marching in place x 20 reps without UE support, alt forward steps off foam and back and laterally off foam x 10 reps each (laterally x 10 each direction), Standing on foam beam perpendicularly with feet slightly apart EO x 20 secs, alternating UE flex x 10 reps adding in head turns x 10 reps.  Side stepping along balance beam x 4 laps (down and back), tandem gait on foam beam with alt cone taps x 4 laps, sit<>stand with feet on foam beam (slightly wider than shoulder width) x 10 reps.      Exercises   Other Exercises  childs pose x 2 reps (one reaching to the L to stretch R side more) then standing lateral reach overhead to the L to stretch R flank.  Pt reports this improved pain level.  Did not rate.                       PT Short Term Goals - 06/26/21 1653       PT SHORT TERM GOAL #1   Title Pt will be IND with initial HEP in order to indicate improved functional mobility and dec fall risk.  (Target Date:06/19/21)    Baseline 06/26/21: HEP just issued this session    Status Not Met      PT SHORT TERM GOAL #2   Title Pt will improve gait speed to >/=2.65 ft/sec in order to indicate safe community ambulation.    Baseline 06/26/21: 2.55 ft/sec no AD, improved just not to goal level    Status Partially Met      PT SHORT TERM GOAL #3   Title Pt will improve 5TSS to </=14 secs without UE support in order to ind dec fall risk and improved functional strength.    Baseline 06/26/21: 13.94 sec's no UE support from standard height surface    Status Achieved      PT SHORT TERM GOAL #4   Title Pt will improve FGA to >/=20/30  in order to indicate dec fall risk.    Baseline 06/26/21: 17/30 scored today, improved by 1 point from last assessment    Status Not Met      PT SHORT TERM GOAL #5   Title Will assess cognitive TUG and write  appropriate LTG to reflect dec fall risk    Baseline 06/26/21: 13.82 sec's no AD while counting backwards from 3. PT to set goals.    Time --    Period --    Status Achieved  PT Long Term Goals - 05/29/21 1547       PT LONG TERM GOAL #1   Title Pt will be IND with final HEP in order to indicate dec fall risk and improved functional mobility.  (Target Date: 07/13/21)    Time 6    Period Weeks    Status New    Target Date 07/13/21      PT LONG TERM GOAL #2   Title Pt will improve gait speed to >/=3.25 ft/sec in order to indicate dec fall risk and improved efficiency of gait.    Time 6    Period Weeks    Status New      PT LONG TERM GOAL #3   Title Pt will perform 5TSS in </=11.5 secs without UE support in order to ind dec fall risk and improved functional strength.    Time 6    Period Weeks    Status New      PT LONG TERM GOAL #4   Title Pt will improve FGA to >/=24/30 in order to indicate dec fall risk.    Time 6    Period Weeks    Status New      PT LONG TERM GOAL #5   Title Pt will ambulate >1000' over varying outdoor surfaces at mod I level demonstrating ability to scan L environment/avoid obstacles on L in order to indicate safe community mobility.    Time 6    Period Weeks    Status New                   Plan - 07/03/21 1324     Clinical Impression Statement Skilled session focused on high level balance with multi sensory challenges.  Did note more of her L proprioceptive/visual neglect deficits today with L foot placement and finding targets.  She is able to correct with time and cues    Personal Factors and Comorbidities Comorbidity 3+    Comorbidities see above    Examination-Activity Limitations Locomotion Level;Squat;Stairs;Stand;Transfers    Examination-Participation Restrictions Community Activity;Driving;Yard Work    Conservation officer, historic buildings Evolving/Moderate complexity    Rehab Potential Good    PT Frequency 2x /  week    PT Duration 6 weeks    PT Treatment/Interventions ADLs/Self Care Home Management;Aquatic Therapy;Gait training;Stair training;Functional mobility training;Therapeutic activities;Therapeutic exercise;Balance training;Neuromuscular re-education;Patient/family education;Passive range of motion;Vestibular;Visual/perceptual remediation/compensation    PT Next Visit Plan Will need to do a recert and push goals out as she has missed several visits.  Goals due on the 6th.  I plan to see her through November. How is HEP going? focus on standing hip strength, hamstring strength, DF, high level balance on compliant surfaces, head motion, EC, coordination    Consulted and Agree with Plan of Care Patient;Family member/caregiver    Family Member Consulted husband walter             Patient will benefit from skilled therapeutic intervention in order to improve the following deficits and impairments:  Abnormal gait, Decreased activity tolerance, Decreased balance, Decreased cognition, Decreased endurance, Decreased mobility, Decreased strength, Impaired perceived functional ability, Impaired sensation, Postural dysfunction  Visit Diagnosis: Unsteadiness on feet  Other abnormalities of gait and mobility  Muscle weakness (generalized)  Hemiplegia and hemiparesis following nontraumatic intracerebral hemorrhage affecting left non-dominant side [facility])     Problem List Patient Active Problem List   Diagnosis Date Noted   Swelling of both lower extremities 05/08/2021   Frequent headaches 03/25/2021  Sciatica of left side 03/25/2021   Chronic low back pain 03/25/2021   Anxiety disorder 03/25/2021   Intraparenchymal hematoma of brain 02/27/2021   Stroke, 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[place]) 02/23/2021   Intracranial hemorrhage ([place]) 02/22/2021   Chemotherapy induced neutropenia ([place]) 01/08/2021   Bradycardia 01/01/2021   Hypomagnesemia 12/10/2020   Nausea without vomiting 12/10/2020   Status post thoracentesis     Pleural effusion, right 11/01/2020   Adenocarcinoma of right lung, stage 2 ([place]) 09/11/2020   Encounter for antineoplastic chemotherapy 09/11/2020   S/P Robotic Assited Video Thoracoscopy with Right Upper Lobectomy Lung 08/26/2020   Mediastinal adenopathy 07/18/2020   Lung nodules    Lung nodule 06/14/2020   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   Tobacco dependence 05/29/2020   Exertional chest pain 05/28/2020   Numbness and tingling of left arm and leg 07/01/2012   Hyperlipidemia with target low density lipoprotein (LDL) cholesterol less than 100 mg/dL 07/01/2012   Chest pressure 06/30/2012   GERD (gastroesophageal reflux disease) 06/30/2012    Cameron Sprang, PT, MPT [facility] [address] [place], [place], [address] Phone: [phone]   Fax:  [phone] 07/03/21, 1:32 PM   Name: Tonya [name] MRN: [id number] Date of Birth: 09/13/1945

## 2021-07-03 NOTE — Therapy (Signed)
[facility] [address] [place], [place], [address] Phone: [phone]   Fax:  [phone]  Occupational Therapy Treatment  Patient Details  Name: Tonya [name] MRN: [id number] Date of Birth: Dec 23, 1945 Referring Provider (OT): Dr. Alysia Penna   Encounter Date: 07/03/2021   OT End of Session - 07/03/21 0936     Visit Number 8    Number of Visits 17    Date for OT Re-Evaluation 07/28/21    Authorization Type [employer]; Out of pocket has been met in full, patient is covered 100%  Prior auth required--awaiting auth    Authorization - Visit Number 8    Authorization - Number of Visits 10    OT Start Time 0935    OT Stop Time 1019    OT Time Calculation (min) 44 min    Equipment Utilized During Treatment FOTO completed but NP (score of functional intake measure of 91 for upper extremity; however, primary deficits are cognition, visual-perceptual, and balance)    Activity Tolerance Patient tolerated treatment well    Behavior During Therapy WFL for tasks assessed/performed             Past Medical History:  Diagnosis Date   Adenoma of left adrenal gland    Cancer ([place])    Lung   Colon polyps    hyperplastic   Complication of anesthesia    Very emotional and cries after anesthesia   GERD (gastroesophageal reflux disease)    High blood pressure 03/25/2021   Kidney stone    Liver lesion    Microhematuria     Past Surgical History:  Procedure Laterality Date   ABDOMINAL HYSTERECTOMY     APPENDECTOMY     BRONCHIAL BIOPSY  07/18/2020   Procedure: BRONCHIAL BIOPSIES;  Surgeon: [name], DO;  Location: [facility];  Service: Pulmonary;;   BRONCHIAL BRUSHINGS  07/18/2020   Procedure: BRONCHIAL BRUSHINGS;  Surgeon: [name], DO;  Location: [facility];  Service: Pulmonary;;   BRONCHIAL NEEDLE ASPIRATION BIOPSY  07/18/2020   Procedure: BRONCHIAL NEEDLE ASPIRATION BIOPSIES;   Surgeon: [name], DO;  Location: [facility];  Service: Pulmonary;;   BRONCHIAL WASHINGS  07/18/2020   Procedure: BRONCHIAL WASHINGS;  Surgeon: [name], DO;  Location: [facility];  Service: Pulmonary;;   FIDUCIAL MARKER PLACEMENT  07/18/2020   Procedure: FIDUCIAL MARKER PLACEMENT;  Surgeon: [name], DO;  Location: [facility];  Service: Pulmonary;;   INTERCOSTAL NERVE BLOCK Right 08/26/2020   Procedure: INTERCOSTAL NERVE BLOCK;  Surgeon: [name], MD;  Location: [facility];  Service: Thoracic;  Laterality: Right;   IR THORACENTESIS ASP PLEURAL SPACE W/IMG GUIDE  10/10/2020   NODE DISSECTION Right 08/26/2020   Procedure: NODE DISSECTION;  Surgeon: [name], MD;  Location: [facility];  Service: Thoracic;  Laterality: Right;   THORACENTESIS Right 11/04/2020   Procedure: Mathews Robinsons;  Surgeon: [name], DO;  Location: [facility];  Service: Pulmonary;  Laterality: Right;   THORACENTESIS Right 11/29/2020   Procedure: THORACENTESIS;  Surgeon: [name], DO;  Location: [facility];  Service: Pulmonary;  Laterality: Right;   VIDEO BRONCHOSCOPY WITH ENDOBRONCHIAL NAVIGATION N/A 07/18/2020   Procedure: VIDEO BRONCHOSCOPY WITH ENDOBRONCHIAL NAVIGATION;  Surgeon: [name], DO;  Location: [facility];  Service: Pulmonary;  Laterality: N/A;   VIDEO BRONCHOSCOPY WITH ENDOBRONCHIAL ULTRASOUND  07/18/2020   Procedure: VIDEO BRONCHOSCOPY WITH ENDOBRONCHIAL ULTRASOUND;  Surgeon: [name], DO;  Location: [facility];  Service: Pulmonary;;    There were no vitals filed for this visit.   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    PMH includes: lung cancer s/p R upper lobectomy, currently undergoing chemo, GERD, HLD, bradycardia    Limitations fall risk, L inattention    Patient Stated Goals be indendepent and return to driving     Currently in Pain? Yes    Pain Score 5     Pain Location Flank    Pain Orientation Right    Pain Descriptors / Indicators Aching    Pain Type Acute pain    Pain Onset More than a month ago    Pain Frequency Intermittent    Aggravating Factors  prolonged sitting and/or walking    Pain Relieving Factors pain meds help some               Attempted 48-piece puzzle with mod difficulty/cueing for problem solving and visual-perception/scanning.  Pt did initiate starting with edge pieces, but cued for separating all initially.  Did not complete due to time constraints.    Reviewed visual compensation strategies:  see if it makes sense/problem solve, look to edges, sort/organize items, use head turns.    Reviewed/discussed how HEP and therapy activities address deficits and how deficits can affect functional performance (reading, cooking, environmental scanning/navigation, driving, etc).           OT Education - 07/03/21 0959     Education Details Reviewed and updated coordination HEP to incr visual scanning/perceptual skills--see pt instructions (needed cueing for eye movements and turning/reaching to the L)    Person(s) Educated Patient;Spouse    Methods Explanation;Handout;Demonstration;Verbal cues    Comprehension Returned demonstration;Verbalized understanding;Verbal cues required              OT Short Term Goals - 07/03/21 1107       OT SHORT TERM GOAL #1   Title Pt will verbalize understanding of visual compensation strategies for ADLs/IADLs for incr ease/safety.--check STGs 06/28/21    Time 4    Period Weeks    Status On-going   07/01/21:  needs reinforcement/cues     OT SHORT TERM GOAL #2   Title Pt will perform simple environmental scanning/navigation with at least 90% accuracy and no LOB.    Time 4    Period Weeks    Status On-going   07/01/21:  87%, pt reports that she continues to bump into objects on the L at home     OT [place] #3   Title Pt  will perform divided attention between at least 1 cognitive and 1 physical task with at least 75% accuracy.    Time 4    Period Weeks    Status On-going   07/01/21:  not met     OT SHORT TERM GOAL #4   Title Pt will be independent with LUE HEP for mild coordination/strength deficits.    Period Weeks    Status On-going   07/01/21:  needs review              OT Long Term Goals - 05/29/21 1521       OT LONG TERM GOAL #1   Title Pt will verbalize understanding of memory/cognitive compensation strategies for ADLs/IADLs for incr ease/safety.--check LTGs 07/28/21    Time 8    Period Weeks  Status New      OT LONG TERM GOAL #2   Title Pt will perform environmental scanning and navigation in busy, dynamic environment with at least 95% accuracy.    Time 8    Period Weeks    Status New      OT LONG TERM GOAL #3   Title Pt will perform divided attention between at least 1 cognitive and 1 physical task with at least 90% accuracy.    Time 8    Period Weeks    Status New      OT LONG TERM GOAL #4   Title Pt will be independent with HEP for visual scanning/cognition.    Time 8    Period Weeks    Status New                   Plan - 07/03/21 0174     Clinical Impression Statement Pt continues to demo decr awareness of deficits and demo difficulty with visual-perceptual skills.    OT Occupational Profile and History Detailed Assessment- Review of Records and additional review of physical, cognitive, psychosocial history related to current functional performance    Occupational performance deficits (Please refer to evaluation for details): ADL's;IADL's;Leisure    Body Structure / Function / Physical Skills ADL;UE functional use;IADL;Balance;Dexterity;Coordination;FMC;Strength    Cognitive Skills Memory;Attention;Problem Solve;Safety Awareness;Sequencing    Rehab Potential Good    Clinical Decision Making Several treatment options, min-mod task modification necessary     Comorbidities Affecting Occupational Performance: May have comorbidities impacting occupational performance    Modification or Assistance to Complete Evaluation  Min-Moderate modification of tasks or assist with assess necessary to complete eval    OT Frequency 2x / week    OT Duration 8 weeks   +eval   OT Treatment/Interventions Self-care/ADL training;DME and/or AE instruction;Balance training;Therapeutic activities;Cognitive remediation/compensation;Therapeutic exercise;Neuromuscular education;Functional Mobility Training;Visual/perceptual remediation/compensation;Patient/family education    Plan continue with environmental scanning, alternating-divided attention, ?theraband/wt. bearing for L shoulder (added OT on 11/10--inform/check with pt, schedule addition OT visits?)    Consulted and Agree with Plan of Care Patient;Family member/caregiver    Family Member Consulted husband             Patient will benefit from skilled therapeutic intervention in order to improve the following deficits and impairments:   Body Structure / Function / Physical Skills: ADL, UE functional use, IADL, Balance, Dexterity, Coordination, FMC, Strength Cognitive Skills: Memory, Attention, Problem Solve, Safety Awareness, Sequencing     Visit Diagnosis: Attention and concentration deficit  Frontal lobe and executive function deficit  Visuospatial deficit  Other lack of coordination  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)  Other symptoms and signs 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  Unsteadiness on feet    Problem List Patient Active Problem List   Diagnosis Date Noted   Swelling of both lower extremities 05/08/2021   Frequent headaches 03/25/2021   Sciatica of left side 03/25/2021   Chronic low back pain 03/25/2021   Anxiety disorder 03/25/2021   Intraparenchymal hematoma of  brain 02/27/2021   Stroke, 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[place]) 02/23/2021   Intracranial hemorrhage ([place]) 02/22/2021   Chemotherapy induced neutropenia ([place]) 01/08/2021   Bradycardia 01/01/2021   Hypomagnesemia 12/10/2020   Nausea without vomiting 12/10/2020   Status post thoracentesis    Pleural effusion, right 11/01/2020   Adenocarcinoma of right lung, stage 2 ([place]) 09/11/2020   Encounter for antineoplastic chemotherapy 09/11/2020   S/P Robotic Assited Video  Thoracoscopy with Right Upper Lobectomy Lung 08/26/2020   Mediastinal adenopathy 07/18/2020   Lung nodules    Lung nodule 06/14/2020   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   Tobacco dependence 05/29/2020   Exertional chest pain 05/28/2020   Numbness and tingling of left arm and leg 07/01/2012   Hyperlipidemia with target low density lipoprotein (LDL) cholesterol less than 100 mg/dL 07/01/2012   Chest pressure 06/30/2012   GERD (gastroesophageal reflux disease) 06/30/2012    [facility], OT/L 07/03/2021, 11:23 AM  [facility] [address] [place], [place], [address] Phone: [phone]   Fax:  [phone]  Name: Tonya [name] MRN: [id number] Date of Birth: [birth year]  Vianne Bulls, OTR/L [facility] [address] [place], [place]  [address] [phone] phone [phone] 07/03/21 11:23 AM

## 2021-07-03 NOTE — Patient Instructions (Addendum)
     Coordination Activities   Perform the following activities for 20 minutes 1 times per day with left hand(s).Perform in seated, do not hike your shoulder.   Rotate ball in fingertips (clockwise and counter-clockwise), watch your hand Toss ball in left hand, watch your hand Toss ball between hands, watch your hand Flip cards 1 at a time to place them on far left side, watch your hand with your eyes. Deal cards with your thumb (Hold deck in hand and push card off top with thumb)  Deal into 4 stacks on left side while watching your hands. Pick up coins and stack.  Watch your hand. Pick up coins one at a time until you get 5-10 in your hand, then move coins from palm to fingertips to place in container one at a time.  Move container to different locations or have multiple containers/cups on left side to place coins in, watch your hand work.  At home when you are reaching for something with your left hand slow down, watch your hand closely. It may help to turn your head slightly to the left.  Try to pick up any light items with left hand, open doors, cabinets, wipe table, etc. For increased attention to left side/arm.  **Use caution with anything breakable, hot, sharp or heavy. I would recommend using your right hand to pick those items up for safety.

## 2021-07-04 ENCOUNTER — Ambulatory Visit
Admission: RE | Admit: 2021-07-04 | Discharge: 2021-07-04 | Disposition: A | Discharge status: Home / Self Care | Payer: Medicare HMO | Source: Ambulatory Visit | Attending: Adult Health | Admitting: Adult Health

## 2021-07-04 DIAGNOSIS — I69398 Other sequelae of cerebral infarction: Secondary | ICD-10-CM

## 2021-07-04 MED ORDER — GADOBENATE DIMEGLUMINE 529 MG/ML IV SOLN
13.0000 mL | Freq: Once | INTRAVENOUS | Status: AC | PRN
  Administered 2021-07-04: 13 mL via INTRAVENOUS

## 2021-07-04 NOTE — Therapy (Signed)
Tonya [name] [address] [place], [place], [address] Phone: [phone]   Fax:  [phone]  Speech Language Pathology Treatment  Patient Details  Name: Tonya [name] MRN: [id number] Date of Birth: [birth year] Referring Provider (SLP): Charlett Blake, MD   Encounter Date: 07/03/2021   End of Session - 07/03/21 1009     Visit Number 6    Number of Visits 17    Date for SLP Re-Evaluation 07/25/21    Authorization Type [employer]    SLP Start Time 1022    SLP Stop Time  1100    SLP Time Calculation (min) 38 min    Activity Tolerance Patient tolerated treatment well             Past Medical History:  Diagnosis Date   Adenoma of left adrenal gland    Cancer ([place])    Lung   Colon polyps    hyperplastic   Complication of anesthesia    Very emotional and cries after anesthesia   GERD (gastroesophageal reflux disease)    High blood pressure 03/25/2021   Kidney stone    Liver lesion    Microhematuria     Past Surgical History:  Procedure Laterality Date   ABDOMINAL HYSTERECTOMY     APPENDECTOMY     BRONCHIAL BIOPSY  07/18/2020   Procedure: BRONCHIAL BIOPSIES;  Surgeon: [name], DO;  Location: [facility];  Service: Pulmonary;;   BRONCHIAL BRUSHINGS  07/18/2020   Procedure: BRONCHIAL BRUSHINGS;  Surgeon: [name], DO;  Location: [facility];  Service: Pulmonary;;   BRONCHIAL NEEDLE ASPIRATION BIOPSY  07/18/2020   Procedure: BRONCHIAL NEEDLE ASPIRATION BIOPSIES;  Surgeon: [name], DO;  Location: [facility];  Service: Pulmonary;;   BRONCHIAL WASHINGS  07/18/2020   Procedure: BRONCHIAL WASHINGS;  Surgeon: [name], DO;  Location: [facility];  Service: Pulmonary;;   FIDUCIAL MARKER PLACEMENT  07/18/2020   Procedure: FIDUCIAL MARKER PLACEMENT;  Surgeon: [name], DO;  Location: [facility];  Service: Pulmonary;;   INTERCOSTAL NERVE BLOCK Right  08/26/2020   Procedure: INTERCOSTAL NERVE BLOCK;  Surgeon: [name], MD;  Location: [facility];  Service: Thoracic;  Laterality: Right;   IR THORACENTESIS ASP PLEURAL SPACE W/IMG GUIDE  10/10/2020   NODE DISSECTION Right 08/26/2020   Procedure: NODE DISSECTION;  Surgeon: [name], MD;  Location: [facility];  Service: Thoracic;  Laterality: Right;   THORACENTESIS Right 11/04/2020   Procedure: Mathews Robinsons;  Surgeon: [name], DO;  Location: [facility];  Service: Pulmonary;  Laterality: Right;   THORACENTESIS Right 11/29/2020   Procedure: THORACENTESIS;  Surgeon: [name], DO;  Location: [facility];  Service: Pulmonary;  Laterality: Right;   VIDEO BRONCHOSCOPY WITH ENDOBRONCHIAL NAVIGATION N/A 07/18/2020   Procedure: VIDEO BRONCHOSCOPY WITH ENDOBRONCHIAL NAVIGATION;  Surgeon: [name], DO;  Location: [facility];  Service: Pulmonary;  Laterality: N/A;   VIDEO BRONCHOSCOPY WITH ENDOBRONCHIAL ULTRASOUND  07/18/2020   Procedure: VIDEO BRONCHOSCOPY WITH ENDOBRONCHIAL ULTRASOUND;  Surgeon: [name], DO;  Location: [facility];  Service: Pulmonary;;    There were no vitals filed for this visit.   Subjective Assessment - 07/03/21 1025     Subjective "The puzzle got me"    Patient is accompained by: Family member    Currently in Pain? Yes    Pain Score 4     Pain Location Flank    Pain Orientation Right  ADULT SLP TREATMENT - 07/03/21 1002       General Information   Behavior/Cognition Alert;Cooperative;Pleasant mood      Treatment Provided   Treatment provided Cognitive-Linquistic      Cognitive-Linquistic Treatment   Treatment focused on Cognition;Patient/family/caregiver education    Skilled Treatment Pt reported discrepanies with hwk for deduction puzzles. Pt required mod verbal and visual A for 2/4 puzzles related to attention and problem solving. SLP provided HEP to address executive functioning and error  awareness. Left neglect still reported and noted while reading. Pt aware of need to re-read if information does not seem connected.      Assessment / Recommendations / Plan   Plan Continue with current plan of care      Progression Toward Goals   Progression toward goals Progressing toward goals              SLP Education - 07/04/21 0757     Education Details HEP    Person(s) Educated Patient;Spouse    Methods Explanation;Demonstration;Handout    Comprehension Verbalized understanding;Returned demonstration;Need further instruction              SLP Short Term Goals - 07/03/21 1010       SLP SHORT TERM GOAL #1   Title Pt will use memory compensations for appointments, finances, and other daily activities with rare min A  over 2 sessions    Baseline 06-23-21, 07-01-21    Time 4    Period Weeks    Status Achieved    Target Date 07/15/21   extended as pt started tx two weeks after eval     SLP SHORT TERM GOAL #2   Title Pt will use verbalize and demo accurate problem solving for functional scenarios encountered at home with rare min A over 2 sessions    Baseline 07-01-21, 07-04-21    Time 4    Period Weeks    Status Achieved    Target Date 07/15/21      SLP SHORT TERM GOAL #3   Title Pt will ID and correct errors on structured cognitive tasks with 80% accuracy given rare min A over 2 sessions    Time 4    Period Weeks    Status On-going    Target Date 07/15/21      SLP SHORT TERM GOAL #4   Title Pt will implement strategy to ensure stove is turned off after cooking for 3/3 opportunities    Baseline 06-23-21, 07-01-21, 07-04-21    Time 4    Period Weeks    Status Achieved    Target Date 07/15/21      SLP SHORT TERM GOAL #5   Title Pt will complete cognitive PROM in first few sessions    Status Achieved    Target Date 07/15/21              SLP Long Term Goals - 07/03/21 1010       SLP LONG TERM GOAL #1   Title Pt will independently manage  appointments and bill pay with use of compensations with no errors reported for 3/3 opportunities    Time 6    Period Weeks    Status On-going    Target Date 07/25/21      SLP LONG TERM GOAL #2   Title Pt will use independently demo accurate problem solving for functional scenarios encountered at home over 2 sessions    Time 8    Period Weeks    Status  On-going    Target Date 07/25/21      SLP LONG TERM GOAL #3   Title Pt will report improved cognitive linguistic functioning on PROM by 5 points at last ST session    Time 8    Period Weeks    Status On-going    Target Date 07/25/21              Plan - 07/03/21 1009     Clinical Impression Statement "Odell" was referred for OPST to address cognition secondary to 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 Pt was accompanied by her husband, [name]. Less dependence on husband for recall reported. SLP targeted structured cognitive tasks and functional application of cognitive skills with recommendations to increase functional independence and continue to reduce reliance on husband with supervision as needed. Given change in baseline cognition, SLP recommends skilled ST intervention to address cognitive linguistic skills to maximize return to PLOF and increase functional independence.    Speech Therapy Frequency 2x / week    Duration 8 weeks    Treatment/Interventions Patient/family education;Functional tasks;Cognitive reorganization;Compensatory techniques;Internal/external aids;SLP instruction and feedback;Environmental controls;Cueing hierarchy;Compensatory strategies    Potential to Achieve Goals Good    SLP Home Exercise Plan provided    Consulted and Agree with Plan of Care Patient;Family member/caregiver             Patient will benefit from skilled therapeutic intervention in order to improve the following deficits and impairments:   Cognitive communication deficit    Problem List Patient Active Problem List   Diagnosis  Date Noted   Swelling of both lower extremities 05/08/2021   Frequent headaches 03/25/2021   Sciatica of left side 03/25/2021   Chronic low back pain 03/25/2021   Anxiety disorder 03/25/2021   Intraparenchymal hematoma of brain 02/27/2021   Stroke, 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[place]) 02/23/2021   Intracranial hemorrhage ([place]) 02/22/2021   Chemotherapy induced neutropenia ([place]) 01/08/2021   Bradycardia 01/01/2021   Hypomagnesemia 12/10/2020   Nausea without vomiting 12/10/2020   Status post thoracentesis    Pleural effusion, right 11/01/2020   Adenocarcinoma of right lung, stage 2 ([place]) 09/11/2020   Encounter for antineoplastic chemotherapy 09/11/2020   S/P Robotic Assited Video Thoracoscopy with Right Upper Lobectomy Lung 08/26/2020   Mediastinal adenopathy 07/18/2020   Lung nodules    Lung nodule 06/14/2020   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   Tobacco dependence 05/29/2020   Exertional chest pain 05/28/2020   Numbness and tingling of left arm and leg 07/01/2012   Hyperlipidemia with target low density lipoprotein (LDL) cholesterol less than 100 mg/dL 07/01/2012   Chest pressure 06/30/2012   GERD (gastroesophageal reflux disease) 06/30/2012    Alinda Deem, MA CCC-SLP 07/04/2021, 7:58 AM  [facility] [address] [place], [place], [address] Phone: [phone]   Fax:  [phone]   Name: KENDRE SIRES MRN: [id number] Date of Birth: August 24, 1946

## 2021-07-08 ENCOUNTER — Other Ambulatory Visit: Payer: Self-pay

## 2021-07-08 ENCOUNTER — Ambulatory Visit: Payer: Medicare HMO | Admitting: Occupational Therapy

## 2021-07-08 ENCOUNTER — Ambulatory Visit: Payer: Medicare HMO | Attending: Physical Medicine & Rehabilitation

## 2021-07-08 ENCOUNTER — Ambulatory Visit: Payer: Medicare HMO

## 2021-07-08 DIAGNOSIS — R2689 Other abnormalities of gait and mobility: Secondary | ICD-10-CM | POA: Insufficient documentation

## 2021-07-08 DIAGNOSIS — R41840 Attention and concentration deficit: Secondary | ICD-10-CM | POA: Diagnosis present

## 2021-07-08 DIAGNOSIS — R41841 Cognitive communication deficit: Secondary | ICD-10-CM

## 2021-07-08 DIAGNOSIS — R41842 Visuospatial deficit: Secondary | ICD-10-CM | POA: Insufficient documentation

## 2021-07-08 DIAGNOSIS — M6281 Muscle weakness (generalized): Secondary | ICD-10-CM | POA: Insufficient documentation

## 2021-07-08 DIAGNOSIS — I69118 Other symptoms and signs 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: Secondary | ICD-10-CM | POA: Diagnosis present

## 2021-07-08 DIAGNOSIS — I69154 Hemiplegia and hemiparesis following nontraumatic intracerebral hemorrhage affecting left non-dominant side: Secondary | ICD-10-CM | POA: Insufficient documentation

## 2021-07-08 DIAGNOSIS — R2681 Unsteadiness on feet: Secondary | ICD-10-CM | POA: Diagnosis not present

## 2021-07-08 DIAGNOSIS — R41844 Frontal lobe and executive function deficit: Secondary | ICD-10-CM | POA: Diagnosis present

## 2021-07-08 DIAGNOSIS — R278 Other lack of coordination: Secondary | ICD-10-CM | POA: Insufficient documentation

## 2021-07-08 DIAGNOSIS — R52 Pain, unspecified: Secondary | ICD-10-CM | POA: Insufficient documentation

## 2021-07-08 NOTE — Addendum Note (Signed)
Addended by: Kerrie Pleasure on: 07/08/2021 04:06 PM   Modules accepted: Orders

## 2021-07-08 NOTE — Therapy (Signed)
[facility] [address] [place], [place], [address] Phone: [phone]   Fax:  [phone]  Occupational Therapy Treatment  Patient Details  Name: Tonya [name] MRN: [id number] Date of Birth: August 26, 1946 Referring Provider (OT): Dr. Alysia Penna   Encounter Date: 07/08/2021   OT End of Session - 07/08/21 1022     Visit Number 9    Number of Visits 17    Date for OT Re-Evaluation 07/28/21    Authorization Type [employer]; Out of pocket has been met in full, patient is covered 100%  Prior auth required--awaiting auth    Authorization - Visit Number 9    Authorization - Number of Visits 10    OT Start Time 0932    OT Stop Time 1018    OT Time Calculation (min) 46 min    Equipment Utilized During Treatment FOTO completed but NP (score of functional intake measure of 91 for upper extremity; however, primary deficits are cognition, visual-perceptual, and balance)    Activity Tolerance Patient tolerated treatment well    Behavior During Therapy WFL for tasks assessed/performed             Past Medical History:  Diagnosis Date   Adenoma of left adrenal gland    Cancer ([place])    Lung   Colon polyps    hyperplastic   Complication of anesthesia    Very emotional and cries after anesthesia   GERD (gastroesophageal reflux disease)    High blood pressure 03/25/2021   Kidney stone    Liver lesion    Microhematuria     Past Surgical History:  Procedure Laterality Date   ABDOMINAL HYSTERECTOMY     APPENDECTOMY     BRONCHIAL BIOPSY  07/18/2020   Procedure: BRONCHIAL BIOPSIES;  Surgeon: [name], DO;  Location: [facility];  Service: Pulmonary;;   BRONCHIAL BRUSHINGS  07/18/2020   Procedure: BRONCHIAL BRUSHINGS;  Surgeon: [name], DO;  Location: [facility];  Service: Pulmonary;;   BRONCHIAL NEEDLE ASPIRATION BIOPSY  07/18/2020   Procedure: BRONCHIAL NEEDLE ASPIRATION BIOPSIES;   Surgeon: [name], DO;  Location: [facility];  Service: Pulmonary;;   BRONCHIAL WASHINGS  07/18/2020   Procedure: BRONCHIAL WASHINGS;  Surgeon: [name], DO;  Location: [facility];  Service: Pulmonary;;   FIDUCIAL MARKER PLACEMENT  07/18/2020   Procedure: FIDUCIAL MARKER PLACEMENT;  Surgeon: [name], DO;  Location: [facility];  Service: Pulmonary;;   INTERCOSTAL NERVE BLOCK Right 08/26/2020   Procedure: INTERCOSTAL NERVE BLOCK;  Surgeon: [name], MD;  Location: [facility];  Service: Thoracic;  Laterality: Right;   IR THORACENTESIS ASP PLEURAL SPACE W/IMG GUIDE  10/10/2020   NODE DISSECTION Right 08/26/2020   Procedure: NODE DISSECTION;  Surgeon: [name], MD;  Location: [facility];  Service: Thoracic;  Laterality: Right;   THORACENTESIS Right 11/04/2020   Procedure: Mathews Robinsons;  Surgeon: [name], DO;  Location: [facility];  Service: Pulmonary;  Laterality: Right;   THORACENTESIS Right 11/29/2020   Procedure: THORACENTESIS;  Surgeon: [name], DO;  Location: [facility];  Service: Pulmonary;  Laterality: Right;   VIDEO BRONCHOSCOPY WITH ENDOBRONCHIAL NAVIGATION N/A 07/18/2020   Procedure: VIDEO BRONCHOSCOPY WITH ENDOBRONCHIAL NAVIGATION;  Surgeon: [name], DO;  Location: [facility];  Service: Pulmonary;  Laterality: N/A;   VIDEO BRONCHOSCOPY WITH ENDOBRONCHIAL ULTRASOUND  07/18/2020   Procedure: VIDEO BRONCHOSCOPY WITH ENDOBRONCHIAL ULTRASOUND;  Surgeon: [name], DO;  Location: [facility];  Service: Pulmonary;;    There were no vitals filed for this visit.   Subjective Assessment - 07/08/21 0936     Pertinent History R parietal lobe ICH and R frontal SAH.    PMH includes: lung cancer s/p R upper lobectomy, currently undergoing chemo, GERD, HLD, bradycardia    Limitations fall risk, L inattention    Patient Stated Goals be indendepent and return to driving    Currently in Pain? Yes    Pain Score 4      Pain Location Hip   And lower side on Lt   Pain Orientation Right    Pain Descriptors / Indicators Tender   and sensitive   Pain Type Acute pain    Pain Onset In the past 7 days    Pain Frequency Constant             Discussed current deficits and how they translate over to functional day to day tasks. Pt w/ decreased awareness into deficits and how they would impact function.   24 pc puzzle for visual scanning, visual/perceptual skills, attention to detail, and strategy/problem solving - pt required cues for all of these skills including finding and completing corner pc's first, edge pc's second, and orienting pieces correctly; as well as attn to detail to match pc's according to color, picture, size, etc. Pt also noted to move several of the puzzle pieces from Lt visual field to Rt side and discussed rationale why she did this, but also why to avoid doing this.   Environmental scanning in moderately busy gym finding 9/12 items on first pass. Pt missed 2 on Lt and 1 on Rt                       OT Short Term Goals - 07/03/21 1107       OT SHORT TERM GOAL #1   Title Pt will verbalize understanding of visual compensation strategies for ADLs/IADLs for incr ease/safety.--check STGs 06/28/21    Time 4    Period Weeks    Status On-going   07/01/21:  needs reinforcement/cues     OT SHORT TERM GOAL #2   Title Pt will perform simple environmental scanning/navigation with at least 90% accuracy and no LOB.    Time 4    Period Weeks    Status On-going   07/01/21:  87%, pt reports that she continues to bump into objects on the L at home     OT [place] #3   Title Pt will perform divided attention between at least 1 cognitive and 1 physical task with at least 75% accuracy.    Time 4    Period Weeks    Status On-going   07/01/21:  not met     OT SHORT TERM GOAL #4   Title Pt will be independent with LUE HEP for mild coordination/strength deficits.    Period Weeks     Status On-going   07/01/21:  needs review              OT Long Term Goals - 05/29/21 1521       OT LONG TERM GOAL #1   Title Pt will verbalize understanding of memory/cognitive compensation strategies for ADLs/IADLs for incr ease/safety.--check LTGs 07/28/21    Time 8    Period Weeks    Status New      OT LONG TERM GOAL #2   Title Pt will perform environmental scanning and  navigation in busy, dynamic environment with at least 95% accuracy.    Time 8    Period Weeks    Status New      OT LONG TERM GOAL #3   Title Pt will perform divided attention between at least 1 cognitive and 1 physical task with at least 90% accuracy.    Time 8    Period Weeks    Status New      OT LONG TERM GOAL #4   Title Pt will be independent with HEP for visual scanning/cognition.    Time 8    Period Weeks    Status New                   Plan - 07/08/21 1023     Clinical Impression Statement Pt continues to demo decr awareness of deficits and demo difficulty with visual-perceptual skills. Pt with difficulty relating deficits to everyday tasks    OT Occupational Profile and History Detailed Assessment- Review of Records and additional review of physical, cognitive, psychosocial history related to current functional performance    Occupational performance deficits (Please refer to evaluation for details): ADL's;IADL's;Leisure    Body Structure / Function / Physical Skills ADL;UE functional use;IADL;Balance;Dexterity;Coordination;FMC;Strength    Cognitive Skills Memory;Attention;Problem Solve;Safety Awareness;Sequencing    Rehab Potential Good    Clinical Decision Making Several treatment options, min-mod task modification necessary    Comorbidities Affecting Occupational Performance: May have comorbidities impacting occupational performance    Modification or Assistance to Complete Evaluation  Min-Moderate modification of tasks or assist with assess necessary to complete eval    OT  Frequency 2x / week    OT Duration 8 weeks   +eval   OT Treatment/Interventions Self-care/ADL training;DME and/or AE instruction;Balance training;Therapeutic activities;Cognitive remediation/compensation;Therapeutic exercise;Neuromuscular education;Functional Mobility Training;Visual/perceptual remediation/compensation;Patient/family education    Plan 10th progress note!, continue with environmental scanning, alternating-divided attention, ?theraband/wt. bearing for L shoulder (added OT on 11/10--inform/check with pt, schedule addition OT visits?)    Consulted and Agree with Plan of Care Patient;Family member/caregiver    Family Member Consulted husband             Patient will benefit from skilled therapeutic intervention in order to improve the following deficits and impairments:   Body Structure / Function / Physical Skills: ADL, UE functional use, IADL, Balance, Dexterity, Coordination, FMC, Strength Cognitive Skills: Memory, Attention, Problem Solve, Safety Awareness, Sequencing     Visit Diagnosis: Attention and concentration deficit  Visuospatial deficit  Frontal lobe and executive function deficit    Problem List Patient Active Problem List   Diagnosis Date Noted   Swelling of both lower extremities 05/08/2021   Frequent headaches 03/25/2021   Sciatica of left side 03/25/2021   Chronic low back pain 03/25/2021   Anxiety disorder 03/25/2021   Intraparenchymal hematoma of brain 02/27/2021   Stroke, 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) 09/11/2020   Encounter for antineoplastic chemotherapy 09/11/2020   S/P Robotic Assited Video Thoracoscopy with Right Upper Lobectomy Lung  08/26/2020   Mediastinal adenopathy 07/18/2020   Lung nodules    Lung nodule 06/14/2020   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   Tobacco dependence 05/29/2020   Exertional chest pain 05/28/2020   Numbness and tingling of  left arm and leg 07/01/2012   Hyperlipidemia with target low density lipoprotein (LDL) cholesterol less than 100 mg/dL 07/01/2012   Chest pressure 06/30/2012   GERD (gastroesophageal reflux disease) 06/30/2012    Carey Bullocks, OTR/L 07/08/2021, 11:21 AM  [facility] [address] [place], [place], [address] Phone: [phone]   Fax:  [phone]  Name: SHENAYA LEBO MRN: [id number] Date of Birth: [birth year]

## 2021-07-08 NOTE — Therapy (Signed)
[facility] [address] [place], [place], [address] Phone: [phone]   Fax:  [phone]  Speech Language Pathology Treatment  Patient Details  Name: Tonya [name] MRN: [id number] Date of Birth: 10/28/1945 Referring Provider (SLP): Charlett Blake, MD   Encounter Date: 07/08/2021   End of Session - 07/08/21 1014     Visit Number 7    Number of Visits 17    Date for SLP Re-Evaluation 07/25/21    Authorization Type [employer]    Authorization Time Period auth for 16 visits 9-22 to 11-18    Authorization - Visit Number 6    Authorization - Number of Visits 16    SLP Start Time 1020    SLP Stop Time  1105    SLP Time Calculation (min) 45 min    Activity Tolerance Patient tolerated treatment well             Past Medical History:  Diagnosis Date   Adenoma of left adrenal gland    Cancer ([place])    Lung   Colon polyps    hyperplastic   Complication of anesthesia    Very emotional and cries after anesthesia   GERD (gastroesophageal reflux disease)    High blood pressure 03/25/2021   Kidney stone    Liver lesion    Microhematuria     Past Surgical History:  Procedure Laterality Date   ABDOMINAL HYSTERECTOMY     APPENDECTOMY     BRONCHIAL BIOPSY  07/18/2020   Procedure: BRONCHIAL BIOPSIES;  Surgeon: [name], DO;  Location: [facility];  Service: Pulmonary;;   BRONCHIAL BRUSHINGS  07/18/2020   Procedure: BRONCHIAL BRUSHINGS;  Surgeon: [name], DO;  Location: [facility];  Service: Pulmonary;;   BRONCHIAL NEEDLE ASPIRATION BIOPSY  07/18/2020   Procedure: BRONCHIAL NEEDLE ASPIRATION BIOPSIES;  Surgeon: [name], DO;  Location: [facility];  Service: Pulmonary;;   BRONCHIAL WASHINGS  07/18/2020   Procedure: BRONCHIAL WASHINGS;  Surgeon: [name], DO;  Location: [facility];  Service: Pulmonary;;   FIDUCIAL MARKER PLACEMENT  07/18/2020   Procedure:  FIDUCIAL MARKER PLACEMENT;  Surgeon: [name], DO;  Location: [facility];  Service: Pulmonary;;   INTERCOSTAL NERVE BLOCK Right 08/26/2020   Procedure: INTERCOSTAL NERVE BLOCK;  Surgeon: [name], MD;  Location: [facility];  Service: Thoracic;  Laterality: Right;   IR THORACENTESIS ASP PLEURAL SPACE W/IMG GUIDE  10/10/2020   NODE DISSECTION Right 08/26/2020   Procedure: NODE DISSECTION;  Surgeon: [name], MD;  Location: [facility];  Service: Thoracic;  Laterality: Right;   THORACENTESIS Right 11/04/2020   Procedure: Mathews Robinsons;  Surgeon: [name], DO;  Location: [facility];  Service: Pulmonary;  Laterality: Right;   THORACENTESIS Right 11/29/2020   Procedure: THORACENTESIS;  Surgeon: [name], DO;  Location: [facility];  Service: Pulmonary;  Laterality: Right;   VIDEO BRONCHOSCOPY WITH ENDOBRONCHIAL NAVIGATION N/A 07/18/2020   Procedure: VIDEO BRONCHOSCOPY WITH ENDOBRONCHIAL NAVIGATION;  Surgeon: [name], DO;  Location: [facility];  Service: Pulmonary;  Laterality: N/A;   VIDEO BRONCHOSCOPY WITH ENDOBRONCHIAL ULTRASOUND  07/18/2020   Procedure: VIDEO BRONCHOSCOPY WITH ENDOBRONCHIAL ULTRASOUND;  Surgeon: [name], DO;  Location: [facility];  Service: Pulmonary;;    There were no vitals filed for this visit.   Subjective Assessment - 07/08/21 1019     Subjective "I spent 5 to 6 hours on one of these pages"  Currently in Pain? Yes    Pain Score 4     Pain Location Hip                   ADULT SLP TREATMENT - 07/08/21 1014       General Information   Behavior/Cognition Alert;Cooperative;Pleasant mood      Treatment Provided   Treatment provided Cognitive-Linquistic      Cognitive-Linquistic Treatment   Treatment focused on Cognition;Patient/family/caregiver education    Skilled Treatment Significant difficulty reported for attention/sequencing task. SLP provided intermittent mod A to aid completion of task,  especially to attend to various details and problem solve. Pt completed additional attention to detail task with 94% accuracy, which improved with min A for errors. SLP reviewed energy conservation strategies as pt worked until overly fatigued over weekend.      Assessment / Recommendations / Plan   Plan Continue with current plan of care      Progression Toward Goals   Progression toward goals Progressing toward goals              SLP Education - 07/08/21 1108     Education Details HEP, energy conservation techniques    Person(s) Educated Patient    Methods Explanation;Demonstration;Handout              SLP Short Term Goals - 07/08/21 1014       SLP SHORT TERM GOAL #1   Title Pt will use memory compensations for appointments, finances, and other daily activities with rare min A  over 2 sessions    Baseline 06-23-21, 07-01-21    Status Achieved    Target Date 07/15/21   extended as pt started tx two weeks after eval     SLP SHORT TERM GOAL #2   Title Pt will use verbalize and demo accurate problem solving for functional scenarios encountered at home with rare min A over 2 sessions    Baseline 07-01-21, 07-04-21    Status Achieved    Target Date 07/15/21      SLP SHORT TERM GOAL #3   Title Pt will ID and correct errors on structured cognitive tasks with 80% accuracy given rare min A over 2 sessions    Baseline 07-08-21    Time 4    Period Weeks    Status On-going    Target Date 07/15/21      SLP SHORT TERM GOAL #4   Title Pt will implement strategy to ensure stove is turned off after cooking for 3/3 opportunities    Baseline 06-23-21, 07-01-21, 07-04-21    Status Achieved    Target Date 07/15/21      SLP SHORT TERM GOAL #5   Title Pt will complete cognitive PROM in first few sessions    Status Achieved    Target Date 07/15/21              SLP Long Term Goals - 07/08/21 1015       SLP LONG TERM GOAL #1   Title Pt will independently manage  appointments and bill pay with use of compensations with no errors reported for 3/3 opportunities    Time 6    Period Weeks    Status On-going    Target Date 07/25/21      SLP LONG TERM GOAL #2   Title Pt will use independently demo accurate problem solving for functional scenarios encountered at home over 2 sessions    Time 8    Period  Weeks    Status On-going    Target Date 07/25/21      SLP LONG TERM GOAL #3   Title Pt will report improved cognitive linguistic functioning on PROM by 5 points at last ST session    Time 8    Period Weeks    Status On-going    Target Date 07/25/21              Plan - 07/08/21 1014     Clinical Impression Statement "Nyjai" was referred for OPST to address cognition secondary to 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 Less dependence on husband for recall reported. SLP targeted structured cognitive tasks to address attention and problem solving, in which pt required intermittent min to mod A for problem solving and attention. Given change in baseline cognition, SLP recommends skilled ST intervention to address cognitive linguistic skills to maximize return to PLOF and increase functional independence.    Speech Therapy Frequency 2x / week    Duration 8 weeks    Treatment/Interventions Patient/family education;Functional tasks;Cognitive reorganization;Compensatory techniques;Internal/external aids;SLP instruction and feedback;Environmental controls;Cueing hierarchy;Compensatory strategies    Potential to Achieve Goals Good    SLP Home Exercise Plan provided    Consulted and Agree with Plan of Care Patient;Family member/caregiver             Patient will benefit from skilled therapeutic intervention in order to improve the following deficits and impairments:   Cognitive communication deficit    Problem List Patient Active Problem List   Diagnosis Date Noted   Swelling of both lower extremities 05/08/2021   Frequent headaches 03/25/2021    Sciatica of left side 03/25/2021   Chronic low back pain 03/25/2021   Anxiety disorder 03/25/2021   Intraparenchymal hematoma of brain 02/27/2021   Stroke, 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[place]) 02/23/2021   Intracranial hemorrhage ([place]) 02/22/2021   Chemotherapy induced neutropenia ([place]) 01/08/2021   Bradycardia 01/01/2021   Hypomagnesemia 12/10/2020   Nausea without vomiting 12/10/2020   Status post thoracentesis    Pleural effusion, right 11/01/2020   Adenocarcinoma of right lung, stage 2 ([facility]) 09/11/2020   Encounter for antineoplastic chemotherapy 09/11/2020   S/P Robotic Assited Video Thoracoscopy with Right Upper Lobectomy Lung 08/26/2020   Mediastinal adenopathy 07/18/2020   Lung nodules    Lung nodule 06/14/2020   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   Tobacco dependence 05/29/2020   Exertional chest pain 05/28/2020   Numbness and tingling of left arm and leg 07/01/2012   Hyperlipidemia with target low density lipoprotein (LDL) cholesterol less than 100 mg/dL 07/01/2012   Chest pressure 06/30/2012   GERD (gastroesophageal reflux disease) 06/30/2012    Alinda Deem, MA CCC-SLP 07/08/2021, 11:13 AM  [facility] [address] [place], [place], [address] Phone: [phone]   Fax:  [phone]   Name: Tonya [name] MRN: [id number] Date of Birth: [birth year]

## 2021-07-08 NOTE — Therapy (Signed)
[facility] [address] [place], [place], [address] Phone: [phone]   Fax:  [phone]  Physical Therapy Treatment  Patient Details  Name: Tonya [name] MRN: [id number] Date of Birth: 04-21-1946 No data recorded  Encounter Date: 07/08/2021   PT End of Session - 07/08/21 0934     Visit Number 5    Number of Visits 13    Date for PT Re-Evaluation 08/05/21    Authorization Type Recert 17/5/10 to 25/85/27    PT Start Time 0845    PT Stop Time 0930    PT Time Calculation (min) 45 min             Past Medical History:  Diagnosis Date   Adenoma of left adrenal gland    Cancer ([place])    Lung   Colon polyps    hyperplastic   Complication of anesthesia    Very emotional and cries after anesthesia   GERD (gastroesophageal reflux disease)    High blood pressure 03/25/2021   Kidney stone    Liver lesion    Microhematuria     Past Surgical History:  Procedure Laterality Date   ABDOMINAL HYSTERECTOMY     APPENDECTOMY     BRONCHIAL BIOPSY  07/18/2020   Procedure: BRONCHIAL BIOPSIES;  Surgeon: [name], DO;  Location: [facility];  Service: Pulmonary;;   BRONCHIAL BRUSHINGS  07/18/2020   Procedure: BRONCHIAL BRUSHINGS;  Surgeon: [name], DO;  Location: [facility];  Service: Pulmonary;;   BRONCHIAL NEEDLE ASPIRATION BIOPSY  07/18/2020   Procedure: BRONCHIAL NEEDLE ASPIRATION BIOPSIES;  Surgeon: [name], DO;  Location: [facility];  Service: Pulmonary;;   BRONCHIAL WASHINGS  07/18/2020   Procedure: BRONCHIAL WASHINGS;  Surgeon: [name], DO;  Location: [facility];  Service: Pulmonary;;   FIDUCIAL MARKER PLACEMENT  07/18/2020   Procedure: FIDUCIAL MARKER PLACEMENT;  Surgeon: [name], DO;  Location: [facility];  Service: Pulmonary;;   INTERCOSTAL NERVE BLOCK Right 08/26/2020   Procedure: INTERCOSTAL NERVE BLOCK;  Surgeon: [name], MD;   Location: [facility];  Service: Thoracic;  Laterality: Right;   IR THORACENTESIS ASP PLEURAL SPACE W/IMG GUIDE  10/10/2020   NODE DISSECTION Right 08/26/2020   Procedure: NODE DISSECTION;  Surgeon: [name], MD;  Location: [facility];  Service: Thoracic;  Laterality: Right;   THORACENTESIS Right 11/04/2020   Procedure: Mathews Robinsons;  Surgeon: [name], DO;  Location: [facility];  Service: Pulmonary;  Laterality: Right;   THORACENTESIS Right 11/29/2020   Procedure: THORACENTESIS;  Surgeon: [name], DO;  Location: [facility];  Service: Pulmonary;  Laterality: Right;   VIDEO BRONCHOSCOPY WITH ENDOBRONCHIAL NAVIGATION N/A 07/18/2020   Procedure: VIDEO BRONCHOSCOPY WITH ENDOBRONCHIAL NAVIGATION;  Surgeon: [name], DO;  Location: [facility];  Service: Pulmonary;  Laterality: N/A;   VIDEO BRONCHOSCOPY WITH ENDOBRONCHIAL ULTRASOUND  07/18/2020   Procedure: VIDEO BRONCHOSCOPY WITH ENDOBRONCHIAL ULTRASOUND;  Surgeon: [name], DO;  Location: [facility];  Service: Pulmonary;;    There were no vitals filed for this visit.   Subjective Assessment - 07/08/21 0935     Subjective Continues to report R flank pain. No other symptoms (no urinary symptoms)    Pertinent History HTN, lung cancer, GERD, HLD    Limitations Walking;House hold activities;Standing    Pain Onset In the past 7 days                [facility] PT Assessment -  07/08/21 0914       Ambulation/Gait   Gait velocity 3.18 ft/s (10.25 s over 10 meters)      Functional Gait  Assessment   Gait Level Surface Walks 20 ft, slow speed, abnormal gait pattern, evidence for imbalance or deviates 10-15 in outside of the 12 in walkway width. Requires more than 7 sec to ambulate 20 ft.    Change in Gait Speed Able to smoothly change walking speed without loss of balance or gait deviation. Deviate no more than 6 in outside of the 12 in walkway width.    Gait with Horizontal Head Turns Performs head turns  smoothly with no change in gait. Deviates no more than 6 in outside 12 in walkway width    Gait with Vertical Head Turns Performs head turns with no change in gait. Deviates no more than 6 in outside 12 in walkway width.    Gait and Pivot Turn Pivot turns safely within 3 sec and stops quickly with no loss of balance.    Step Over Obstacle Is able to step over 2 stacked shoe boxes taped together (9 in total height) without changing gait speed. No evidence of imbalance.    Gait with Narrow Base of Support Is able to ambulate for 10 steps heel to toe with no staggering.    Gait with Eyes Closed Walks 20 ft, slow speed, abnormal gait pattern, evidence for imbalance, deviates 10-15 in outside 12 in walkway width. Requires more than 9 sec to ambulate 20 ft.    Ambulating Backwards Walks 20 ft, uses assistive device, slower speed, mild gait deviations, deviates 6-10 in outside 12 in walkway width.    Steps Alternating feet, must use rail.    Total Score 24    FGA comment: 24/30              Reassessment performed today Gait training: 1 x 230' cues for arm swings, increased cadence noted when walking 20 feet without arm swings 12.6 sec vs 10 sec with arm swings. Soft tissue massage to R lumbar paraspinalis-improved pain in her back and R flank noted afterwards. Pt educated on continuing with HEP reviewed last session to work on stretching.                        PT Short Term Goals - 06/26/21 1653       PT SHORT TERM GOAL #1   Title Pt will be IND with initial HEP in order to indicate improved functional mobility and dec fall risk.  (Target Date:06/19/21)    Baseline 06/26/21: HEP just issued this session    Status Not Met      PT SHORT TERM GOAL #2   Title Pt will improve gait speed to >/=2.65 ft/sec in order to indicate safe community ambulation.    Baseline 06/26/21: 2.55 ft/sec no AD, improved just not to goal level    Status Partially Met      PT SHORT TERM GOAL  #3   Title Pt will improve 5TSS to </=14 secs without UE support in order to ind dec fall risk and improved functional strength.    Baseline 06/26/21: 13.94 sec's no UE support from standard height surface    Status Achieved      PT SHORT TERM GOAL #4   Title Pt will improve FGA to >/=20/30  in order to indicate dec fall risk.    Baseline 06/26/21: 17/30 scored today, improved by 1  point from last assessment    Status Not Met      PT SHORT TERM GOAL #5   Title Will assess cognitive TUG and write appropriate LTG to reflect dec fall risk    Baseline 06/26/21: 13.82 sec's no AD while counting backwards from 3. PT to set goals.    Time --    Period --    Status Achieved               PT Long Term Goals - 07/08/21 0925       PT LONG TERM GOAL #1   Title Pt will be IND with final HEP in order to indicate dec fall risk and improved functional mobility.  (Target Date: 08/05/21)    Time 4    Period Weeks    Status On-going    Target Date 08/05/21      PT LONG TERM GOAL #2   Title Pt will improve gait speed to >/=3.25 ft/sec in order to indicate dec fall risk and improved efficiency of gait.    Baseline 3.18 ft/s (07/08/21)    Time 4    Period Weeks    Status On-going    Target Date 08/05/21      PT LONG TERM GOAL #3   Title Pt will perform 5TSS in </=11.5 secs without UE support in order to ind dec fall risk and improved functional strength.    Baseline 12.72 sec (07/08/21)    Time 4    Period Weeks    Status On-going    Target Date 08/05/21      PT LONG TERM GOAL #4   Title Pt will improve FGA to >/=24/30 in order to indicate dec fall risk.    Baseline 24/30 (07/08/21)    Time 4    Period Weeks    Status Achieved      PT LONG TERM GOAL #5   Title Pt will ambulate >1000' over varying outdoor surfaces at mod I level demonstrating ability to scan L environment/avoid obstacles on L in order to indicate safe community mobility.    Time 4    Period Weeks    Status On-going     Target Date 08/05/21                   Plan - 07/08/21 0851     Clinical Impression Statement Patient has been seen for total of 5 sessions from 05/28/21 to 07/08/21. Patient is making good progress towards her short term and long term functional goals. Patient will continue to benefit from skilled PT to address gait and balance impairments.    Personal Factors and Comorbidities Comorbidity 3+    Comorbidities see above    Examination-Activity Limitations Locomotion Level;Squat;Stairs;Stand;Transfers    Examination-Participation Restrictions Community Activity;Driving;Yard Work    [employer] Evolving/Moderate complexity    Rehab Potential Good    PT Frequency 2x / week    PT Duration 4 weeks    PT Treatment/Interventions ADLs/Self Care Home Management;Aquatic Therapy;Gait training;Stair training;Functional mobility training;Therapeutic activities;Therapeutic exercise;Balance training;Neuromuscular re-education;Patient/family education;Passive range of motion;Vestibular;Visual/perceptual remediation/compensation;Manual techniques;Joint Manipulations;Orthotic Fit/Training;Moist Heat;Cryotherapy;Energy conservation;Spinal Manipulations    PT Next Visit Plan Will need to do a recert and push goals out as she has missed several visits.  Goals due on the 6th.  I plan to see her through November. How is HEP going? focus on standing hip strength, hamstring strength, DF, high level balance on compliant surfaces, head motion, EC, coordination  Consulted and Agree with Plan of Care Patient;Family member/caregiver    Family Member Consulted husband walter             Patient will benefit from skilled therapeutic intervention in order to improve the following deficits and impairments:  Abnormal gait, Decreased activity tolerance, Decreased balance, Decreased cognition, Decreased endurance, Decreased mobility, Decreased strength, Impaired perceived functional ability,  Impaired sensation, Postural dysfunction  Visit Diagnosis: Unsteadiness on feet  Other abnormalities of gait and mobility  Muscle weakness (generalized)  Pain  Hemiplegia and hemiparesis following nontraumatic intracerebral hemorrhage affecting left non-dominant side ([place])     Problem List Patient Active Problem List   Diagnosis Date Noted   Swelling of both lower extremities 05/08/2021   Frequent headaches 03/25/2021   Sciatica of left side 03/25/2021   Chronic low back pain 03/25/2021   Anxiety disorder 03/25/2021   Intraparenchymal hematoma of brain 02/27/2021   Stroke, 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[place]) 02/23/2021   Intracranial hemorrhage ([place]) 02/22/2021   Chemotherapy induced neutropenia ([place]) 01/08/2021   Bradycardia 01/01/2021   Hypomagnesemia 12/10/2020   Nausea without vomiting 12/10/2020   Status post thoracentesis    Pleural effusion, right 11/01/2020   Adenocarcinoma of right lung, stage 2 ([place]) 09/11/2020   Encounter for antineoplastic chemotherapy 09/11/2020   S/P Robotic Assited Video Thoracoscopy with Right Upper Lobectomy Lung 08/26/2020   Mediastinal adenopathy 07/18/2020   Lung nodules    Lung nodule 06/14/2020   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   Tobacco dependence 05/29/2020   Exertional chest pain 05/28/2020   Numbness and tingling of left arm and leg 07/01/2012   Hyperlipidemia with target low density lipoprotein (LDL) cholesterol less than 100 mg/dL 07/01/2012   Chest pressure 06/30/2012   GERD (gastroesophageal reflux disease) 06/30/2012    Kerrie Pleasure, PT 07/08/2021, 4:04 PM  [facility] [address] [place], [place], [address] Phone: [phone]   Fax:  [phone]  Name: KYREE FEDORKO MRN: [id number] Date of Birth: 05-04-1946

## 2021-07-10 ENCOUNTER — Encounter: Payer: Self-pay | Admitting: Physical Therapy

## 2021-07-10 ENCOUNTER — Ambulatory Visit: Payer: Medicare HMO | Admitting: Occupational Therapy

## 2021-07-10 ENCOUNTER — Encounter: Payer: Self-pay | Admitting: Occupational Therapy

## 2021-07-10 ENCOUNTER — Ambulatory Visit: Payer: Medicare HMO | Admitting: Physical Therapy

## 2021-07-10 ENCOUNTER — Other Ambulatory Visit: Payer: Self-pay

## 2021-07-10 ENCOUNTER — Ambulatory Visit: Payer: Medicare HMO

## 2021-07-10 DIAGNOSIS — R278 Other lack of coordination: Secondary | ICD-10-CM

## 2021-07-10 DIAGNOSIS — R2681 Unsteadiness on feet: Secondary | ICD-10-CM | POA: Diagnosis not present

## 2021-07-10 DIAGNOSIS — I69118 Other symptoms and signs 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: Secondary | ICD-10-CM

## 2021-07-10 DIAGNOSIS — R41842 Visuospatial deficit: Secondary | ICD-10-CM

## 2021-07-10 DIAGNOSIS — R2689 Other abnormalities of gait and mobility: Secondary | ICD-10-CM

## 2021-07-10 DIAGNOSIS — M6281 Muscle weakness (generalized): Secondary | ICD-10-CM

## 2021-07-10 DIAGNOSIS — R41844 Frontal lobe and executive function deficit: Secondary | ICD-10-CM

## 2021-07-10 DIAGNOSIS — I69154 Hemiplegia and hemiparesis following nontraumatic intracerebral hemorrhage affecting left non-dominant side: Secondary | ICD-10-CM

## 2021-07-10 DIAGNOSIS — R41840 Attention and concentration deficit: Secondary | ICD-10-CM

## 2021-07-10 DIAGNOSIS — R41841 Cognitive communication deficit: Secondary | ICD-10-CM

## 2021-07-10 NOTE — Patient Instructions (Signed)
Work slowly  Read the instructions at least twice before you begin the task  If you are having trouble, stop and try to identify what is the problem  Only work for 30 minutes at a time

## 2021-07-10 NOTE — Therapy (Signed)
[facility] [address] [place], [place], [address] Phone: [phone]   Fax:  [phone]  Speech Language Pathology Treatment  Patient Details  Name: Tonya [name] MRN: [id number] Date of Birth: Jan 15, 1946 Referring Provider (SLP): Charlett Blake, MD   Encounter Date: 07/10/2021   End of Session - 07/10/21 1150     Visit Number 8    Number of Visits 17    Date for SLP Re-Evaluation 07/25/21    Authorization Type [employer]    Authorization Time Period auth for 16 visits 9-22 to 11-18    Authorization - Visit Number 7    Authorization - Number of Visits 16    SLP Start Time 1101    SLP Stop Time  1148    SLP Time Calculation (min) 47 min    Activity Tolerance Patient tolerated treatment well             Past Medical History:  Diagnosis Date   Adenoma of left adrenal gland    Cancer ([place])    Lung   Colon polyps    hyperplastic   Complication of anesthesia    Very emotional and cries after anesthesia   GERD (gastroesophageal reflux disease)    High blood pressure 03/25/2021   Kidney stone    Liver lesion    Microhematuria     Past Surgical History:  Procedure Laterality Date   ABDOMINAL HYSTERECTOMY     APPENDECTOMY     BRONCHIAL BIOPSY  07/18/2020   Procedure: BRONCHIAL BIOPSIES;  Surgeon: [name], DO;  Location: [facility];  Service: Pulmonary;;   BRONCHIAL BRUSHINGS  07/18/2020   Procedure: BRONCHIAL BRUSHINGS;  Surgeon: [name], DO;  Location: [facility];  Service: Pulmonary;;   BRONCHIAL NEEDLE ASPIRATION BIOPSY  07/18/2020   Procedure: BRONCHIAL NEEDLE ASPIRATION BIOPSIES;  Surgeon: [name], DO;  Location: [facility];  Service: Pulmonary;;   BRONCHIAL WASHINGS  07/18/2020   Procedure: BRONCHIAL WASHINGS;  Surgeon: [name], DO;  Location: [facility];  Service: Pulmonary;;   FIDUCIAL MARKER PLACEMENT  07/18/2020   Procedure:  FIDUCIAL MARKER PLACEMENT;  Surgeon: [name], DO;  Location: [facility];  Service: Pulmonary;;   INTERCOSTAL NERVE BLOCK Right 08/26/2020   Procedure: INTERCOSTAL NERVE BLOCK;  Surgeon: [name], MD;  Location: [facility];  Service: Thoracic;  Laterality: Right;   IR THORACENTESIS ASP PLEURAL SPACE W/IMG GUIDE  10/10/2020   NODE DISSECTION Right 08/26/2020   Procedure: NODE DISSECTION;  Surgeon: [name], MD;  Location: [facility];  Service: Thoracic;  Laterality: Right;   THORACENTESIS Right 11/04/2020   Procedure: Mathews Robinsons;  Surgeon: [name], DO;  Location: [facility];  Service: Pulmonary;  Laterality: Right;   THORACENTESIS Right 11/29/2020   Procedure: THORACENTESIS;  Surgeon: [name], DO;  [facility];  Service: Pulmonary;  Laterality: Right;   VIDEO BRONCHOSCOPY WITH ENDOBRONCHIAL NAVIGATION N/A 07/18/2020   Procedure: VIDEO BRONCHOSCOPY WITH ENDOBRONCHIAL NAVIGATION;  Surgeon: [name], DO;  Location: [facility];  Service: Pulmonary;  Laterality: N/A;   VIDEO BRONCHOSCOPY WITH ENDOBRONCHIAL ULTRASOUND  07/18/2020   Procedure: VIDEO BRONCHOSCOPY WITH ENDOBRONCHIAL ULTRASOUND;  Surgeon: [name], DO;  Location: [facility];  Service: Pulmonary;;    There were no vitals filed for this visit.   Subjective Assessment - 07/10/21 1102     Subjective "so so"    Currently in Pain? Yes    Pain Score  10-Worst pain ever    Pain Location Hip                   ADULT SLP TREATMENT - 07/10/21 1103       General Information   Behavior/Cognition Alert;Cooperative;Pleasant mood      Treatment Provided   Treatment provided Cognitive-Linquistic      Cognitive-Linquistic Treatment   Treatment focused on Cognition;Patient/family/caregiver education    Skilled Treatment Pt reports significant difficulty and frustration related attention tasks for sequencing. Difficulty comprehending and following instructions  reported and demonstrated, in which pt's husband needed to clarify and model for improved accuracy. SLP provided step by step sequencing to aid attention, in which pt still needed verbal cues in combination with consistent visual cues. SLP provided verbal instruction and handout with recommendations to work slowly, limit timeframes to reduce frustration and cognitive fatigue, and re-reading instructions to optimize comprehension.      Assessment / Recommendations / Plan   Plan Continue with current plan of care      Progression Toward Goals   Progression toward goals Progressing toward goals              SLP Education - 07/10/21 1147     Education Details work slowly, take breaks, problem solve, read instructions multiple times    Person(s) Educated Patient    Methods Explanation;Demonstration;Handout;Verbal cues    Comprehension Verbalized understanding;Returned demonstration;Need further instruction;Verbal cues required              SLP Short Term Goals - 07/10/21 1151       SLP SHORT TERM GOAL #1   Title Pt will use memory compensations for appointments, finances, and other daily activities with rare min A  over 2 sessions    Baseline 06-23-21, 07-01-21    Status Achieved    Target Date 07/15/21   extended as pt started tx two weeks after eval     SLP SHORT TERM GOAL #2   Title Pt will use verbalize and demo accurate problem solving for functional scenarios encountered at home with rare min A over 2 sessions    Baseline 07-01-21, 07-04-21    Status Achieved    Target Date 07/15/21      SLP SHORT TERM GOAL #3   Title Pt will ID and correct errors on structured cognitive tasks with 80% accuracy given rare min A over 2 sessions    Baseline 07-08-21    Time 4    Period Weeks    Status On-going    Target Date 07/15/21      SLP SHORT TERM GOAL #4   Title Pt will implement strategy to ensure stove is turned off after cooking for 3/3 opportunities    Baseline 06-23-21,  07-01-21, 07-04-21    Status Achieved    Target Date 07/15/21      SLP SHORT TERM GOAL #5   Title Pt will complete cognitive PROM in first few sessions    Status Achieved    Target Date 07/15/21              SLP Long Term Goals - 07/10/21 1152       SLP LONG TERM GOAL #1   Title Pt will independently manage appointments and bill pay with use of compensations with no errors reported for 3/3 opportunities    Time 6    Period Weeks    Status On-going    Target Date 07/25/21      SLP  LONG TERM GOAL #2   Title Pt will use independently demo accurate problem solving for functional scenarios encountered at home over 2 sessions    Time 8    Period Weeks    Status On-going    Target Date 07/25/21      SLP LONG TERM GOAL #3   Title Pt will report improved cognitive linguistic functioning on PROM by 5 points at last ST session    Time 8    Period Weeks    Status On-going    Target Date 07/25/21              Plan - 07/10/21 1151     Clinical Impression Statement "Tarin" was referred for OPST to address cognition secondary to 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 Pt experiencing difficulty completing mod complex cognitive tasks targeting attention, following instructions, and problem solving. Pt required usual mod A to complete tasks to increase attention to detail, aid problem solving, and correct errors. Given change in baseline cognition, SLP recommends skilled ST intervention to address cognitive linguistic skills to maximize return to PLOF and increase functional independence.    Speech Therapy Frequency 2x / week    Duration 8 weeks    Treatment/Interventions Patient/family education;Functional tasks;Cognitive reorganization;Compensatory techniques;Internal/external aids;SLP instruction and feedback;Environmental controls;Cueing hierarchy;Compensatory strategies    Potential to Achieve Goals Good    SLP Home Exercise Plan provided    Consulted and Agree with Plan of Care  Patient;Family member/caregiver             Patient will benefit from skilled therapeutic intervention in order to improve the following deficits and impairments:   Cognitive communication deficit    Problem List Patient Active Problem List   Diagnosis Date Noted   Swelling of both lower extremities 05/08/2021   Frequent headaches 03/25/2021   Sciatica of left side 03/25/2021   Chronic low back pain 03/25/2021   Anxiety disorder 03/25/2021   Intraparenchymal hematoma of brain 02/27/2021   Stroke, 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[place]) 02/23/2021   Intracranial hemorrhage ([place]) 02/22/2021   Chemotherapy induced neutropenia ([place]) 01/08/2021   Bradycardia 01/01/2021   Hypomagnesemia 12/10/2020   Nausea without vomiting 12/10/2020   Status post thoracentesis    Pleural effusion, right 11/01/2020   Adenocarcinoma of right lung, stage 2 ([place]) 09/11/2020   Encounter for antineoplastic chemotherapy 09/11/2020   S/P Robotic Assited Video Thoracoscopy with Right Upper Lobectomy Lung 08/26/2020   Mediastinal adenopathy 07/18/2020   Lung nodules    Lung nodule 06/14/2020   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   Tobacco dependence 05/29/2020   Exertional chest pain 05/28/2020   Numbness and tingling of left arm and leg 07/01/2012   Hyperlipidemia with target low density lipoprotein (LDL) cholesterol less than 100 mg/dL 07/01/2012   Chest pressure 06/30/2012   GERD (gastroesophageal reflux disease) 06/30/2012    Alinda Deem, MA CCC-SLP 07/10/2021, 12:11 PM  [facility] [address] [place], [place], [address] Phone: [phone]   Fax:  [phone]   Name: Tonya [name] MRN: [id number] Date of Birth: [birth year]

## 2021-07-10 NOTE — Therapy (Signed)
[facility] [address] [place], [place], [address] Phone: [phone]   Fax:  [phone]  Physical Therapy Treatment  Patient Details  Name: Tonya [name] MRN: [id number] Date of Birth: [birth year] No data recorded  Encounter Date: 07/10/2021   PT End of Session - 07/10/21 1022     Visit Number 6    Number of Visits 13    Date for PT Re-Evaluation 08/05/21    Authorization Type Recert 99/3/57 to 01/77/93    Progress Note Due on Visit 10    PT Start Time 1019    PT Stop Time 1100    PT Time Calculation (min) 41 min    Equipment Utilized During Treatment Gait belt    Activity Tolerance Patient tolerated treatment well;No increased pain    Behavior During Therapy WFL for tasks assessed/performed             Past Medical History:  Diagnosis Date   Adenoma of left adrenal gland    Cancer ([place])    Lung   Colon polyps    hyperplastic   Complication of anesthesia    Very emotional and cries after anesthesia   GERD (gastroesophageal reflux disease)    High blood pressure 03/25/2021   Kidney stone    Liver lesion    Microhematuria     Past Surgical History:  Procedure Laterality Date   ABDOMINAL HYSTERECTOMY     APPENDECTOMY     BRONCHIAL BIOPSY  07/18/2020   Procedure: BRONCHIAL BIOPSIES;  Surgeon: [name], DO;  Location: [facility];  Service: Pulmonary;;   BRONCHIAL BRUSHINGS  07/18/2020   Procedure: BRONCHIAL BRUSHINGS;  Surgeon: [name], DO;  Location: [facility];  Service: Pulmonary;;   BRONCHIAL NEEDLE ASPIRATION BIOPSY  07/18/2020   Procedure: BRONCHIAL NEEDLE ASPIRATION BIOPSIES;  Surgeon: [name], DO;  Location: [facility];  Service: Pulmonary;;   BRONCHIAL WASHINGS  07/18/2020   Procedure: BRONCHIAL WASHINGS;  Surgeon: [name], DO;  Location: [facility];  Service: Pulmonary;;   FIDUCIAL MARKER PLACEMENT  07/18/2020   Procedure: FIDUCIAL  MARKER PLACEMENT;  Surgeon: [name], DO;  Location: [facility];  Service: Pulmonary;;   INTERCOSTAL NERVE BLOCK Right 08/26/2020   Procedure: INTERCOSTAL NERVE BLOCK;  Surgeon: [name], MD;  Location: [facility];  Service: Thoracic;  Laterality: Right;   IR THORACENTESIS ASP PLEURAL SPACE W/IMG GUIDE  10/10/2020   NODE DISSECTION Right 08/26/2020   Procedure: NODE DISSECTION;  Surgeon: [name], MD;  Location: [facility];  Service: Thoracic;  Laterality: Right;   THORACENTESIS Right 11/04/2020   Procedure: Mathews Robinsons;  Surgeon: [name], DO;  Location: [facility];  Service: Pulmonary;  Laterality: Right;   THORACENTESIS Right 11/29/2020   Procedure: THORACENTESIS;  Surgeon: [name], DO;  [facility];  Service: Pulmonary;  Laterality: Right;   VIDEO BRONCHOSCOPY WITH ENDOBRONCHIAL NAVIGATION N/A 07/18/2020   Procedure: VIDEO BRONCHOSCOPY WITH ENDOBRONCHIAL NAVIGATION;  Surgeon: [name], DO;  Location: [facility];  Service: Pulmonary;  Laterality: N/A;   VIDEO BRONCHOSCOPY WITH ENDOBRONCHIAL ULTRASOUND  07/18/2020   Procedure: VIDEO BRONCHOSCOPY WITH ENDOBRONCHIAL ULTRASOUND;  Surgeon: [name], DO;  Location: [facility];  Service: Pulmonary;;    There were no vitals filed for this visit.   Subjective Assessment - 07/10/21 1020     Subjective No new complaints. Having right hip pain all the way down to the ankle.    Pertinent  History HTN, lung cancer, GERD, HLD    Limitations Walking;House hold activities;Standing    How long can you stand comfortably? >20 mins    How long can you walk comfortably? 30 mins    Patient Stated Goals "I want to improve my balance"    Currently in Pain? Yes    Pain Score 10-Worst pain ever    Pain Location Hip    Pain Orientation Right    Pain Descriptors / Indicators Sharp    Pain Type Chronic pain    Pain Onset 1 to 4 weeks ago    Pain Frequency Constant   varies in intensity    Aggravating Factors  walking, prolonged sitting    Pain Relieving Factors pain medications, massage at last session                      [facility] Adult PT Treatment/Exercise - 07/10/21 1037       Transfers   Transfers Sit to Stand;Stand to Sit    Sit to Stand 6: Modified independent (Device/Increase time)    Stand to Sit 6: Modified independent (Device/Increase time)      Ambulation/Gait   Ambulation/Gait Yes    Ambulation/Gait Assistance 5: Supervision    Ambulation Distance (Feet) --   around clinic with session   Assistive device None    Gait Pattern Step-through pattern;Decreased stride length;Left foot flat;Trunk flexed;Narrow base of support;Poor foot clearance - left;Poor foot clearance - right    Ambulation Surface Level;Indoor      Manual Therapy   Manual therapy comments with pt in left sidelying with pillow between knees: with foam roll- mild to moderate pressure with rolling along IT band for decreased tightness/pain' then with myofascial release ball: to right flank down to hip/IT band. Most pain reported along greater trochanter with increased time spent there. Pt reported decreased pain afterwards. Pt felt Myofasicial ball helped alot and was provided ordering information to obtain one for home.                 Balance Exercises - 07/10/21 1042       Balance Exercises: Standing   Standing Eyes Closed Wide (BOA);Foam/compliant surface;Other reps (comment);Limitations    Standing Eyes Closed Limitations standing across blue foam beam with no UE support: EC 30 sec's x 3 reps with up to min assist for balance, cues on posture/weight shifting to assist with balance.    Balance Beam standing across blue foam beam with no UE support, occasional touch to bars: alternating forward stepping to floor/back onto beam, then alternating backward stepping to floor/back onto beam for 10 reps each/each way. cues for increased step height/length and weight shifting. min  guard to min assist for balance.    Tandem Gait Forward;Retro;Upper extremity support;Foam/compliant surface;4 reps;3 reps;Limitations    Tandem Gait Limitations on blue foam beam with light UE support on bars. cues on posture and step placement.    Sidestepping Foam/compliant support;3 reps;Limitations    Sidestepping Limitations on blue foam beam with light to no UE support with cues on step length/height. min guard to min assist for balance.                  PT Short Term Goals - 06/26/21 1653       PT SHORT TERM GOAL #1   Title Pt will be IND with initial HEP in order to indicate improved functional mobility and dec fall risk.  (Target Date:06/19/21)  Baseline 06/26/21: HEP just issued this session    Status Not Met      PT SHORT TERM GOAL #2   Title Pt will improve gait speed to >/=2.65 ft/sec in order to indicate safe community ambulation.    Baseline 06/26/21: 2.55 ft/sec no AD, improved just not to goal level    Status Partially Met      PT SHORT TERM GOAL #3   Title Pt will improve 5TSS to </=14 secs without UE support in order to ind dec fall risk and improved functional strength.    Baseline 06/26/21: 13.94 sec's no UE support from standard height surface    Status Achieved      PT SHORT TERM GOAL #4   Title Pt will improve FGA to >/=20/30  in order to indicate dec fall risk.    Baseline 06/26/21: 17/30 scored today, improved by 1 point from last assessment    Status Not Met      PT SHORT TERM GOAL #5   Title Will assess cognitive TUG and write appropriate LTG to reflect dec fall risk    Baseline 06/26/21: 13.82 sec's no AD while counting backwards from 3. PT to set goals.    Time --    Period --    Status Achieved               PT Long Term Goals - 07/08/21 0925       PT LONG TERM GOAL #1   Title Pt will be IND with final HEP in order to indicate dec fall risk and improved functional mobility.  (Target Date: 08/05/21)    Time 4    Period Weeks     Status On-going    Target Date 08/05/21      PT LONG TERM GOAL #2   Title Pt will improve gait speed to >/=3.25 ft/sec in order to indicate dec fall risk and improved efficiency of gait.    Baseline 3.18 ft/s (07/08/21)    Time 4    Period Weeks    Status On-going    Target Date 08/05/21      PT LONG TERM GOAL #3   Title Pt will perform 5TSS in </=11.5 secs without UE support in order to ind dec fall risk and improved functional strength.    Baseline 12.72 sec (07/08/21)    Time 4    Period Weeks    Status On-going    Target Date 08/05/21      PT LONG TERM GOAL #4   Title Pt will improve FGA to >/=24/30 in order to indicate dec fall risk.    Baseline 24/30 (07/08/21)    Time 4    Period Weeks    Status Achieved      PT LONG TERM GOAL #5   Title Pt will ambulate >1000' over varying outdoor surfaces at mod I level demonstrating ability to scan L environment/avoid obstacles on L in order to indicate safe community mobility.    Time 4    Period Weeks    Status On-going    Target Date 08/05/21                   Plan - 07/10/21 1022     Clinical Impression Statement Today's skilled session initially focused on pain management with use of foam roll and myofascial release ball. Pt with decreased pain and muscle tightness afterwards (reported much better) and really liked how the ball worked. Pt was provided  with information on how to obtain a Myofascial release ball for home. Remainder of session continued to focus on strengthening and balance training with no issues or increased pain reported. The pt is progressing toward goals and should benefit from continued PT to progress toward unmet goals.    Personal Factors and Comorbidities Comorbidity 3+    Comorbidities see above    Examination-Activity Limitations Locomotion Level;Squat;Stairs;Stand;Transfers    Examination-Participation Restrictions Community Activity;Driving;Yard Work    [employer]  Evolving/Moderate complexity    Rehab Potential Good    PT Frequency 2x / week    PT Duration 4 weeks    PT Treatment/Interventions ADLs/Self Care Home Management;Aquatic Therapy;Gait training;Stair training;Functional mobility training;Therapeutic activities;Therapeutic exercise;Balance training;Neuromuscular re-education;Patient/family education;Passive range of motion;Vestibular;Visual/perceptual remediation/compensation;Manual techniques;Joint Manipulations;Orthotic Fit/Training;Moist Heat;Cryotherapy;Energy conservation;Spinal Manipulations    PT Next Visit Plan How is HEP going? focus on standing hip strength, hamstring strength, DF, high level balance on compliant surfaces, head motion, EC, coordination    Consulted and Agree with Plan of Care Patient             Patient will benefit from skilled therapeutic intervention in order to improve the following deficits and impairments:  Abnormal gait, Decreased activity tolerance, Decreased balance, Decreased cognition, Decreased endurance, Decreased mobility, Decreased strength, Impaired perceived functional ability, Impaired sensation, Postural dysfunction  Visit Diagnosis: Other abnormalities of gait and mobility  Muscle weakness (generalized)  Unsteadiness on feet  Hemiplegia and hemiparesis following nontraumatic intracerebral hemorrhage affecting left non-dominant side [facility])     Problem List Patient Active Problem List   Diagnosis Date Noted   Swelling of both lower extremities 05/08/2021   Frequent headaches 03/25/2021   Sciatica of left side 03/25/2021   Chronic low back pain 03/25/2021   Anxiety disorder 03/25/2021   Intraparenchymal hematoma of brain 02/27/2021   Stroke, 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[place]) 02/23/2021   Intracranial hemorrhage ([place]) 02/22/2021   Chemotherapy induced neutropenia ([place]) 01/08/2021   Bradycardia 01/01/2021   Hypomagnesemia 12/10/2020   Nausea without vomiting 12/10/2020   Status post thoracentesis     Pleural effusion, right 11/01/2020   Adenocarcinoma of right lung, stage 2 ([place]) 09/11/2020   Encounter for antineoplastic chemotherapy 09/11/2020   S/P Robotic Assited Video Thoracoscopy with Right Upper Lobectomy Lung 08/26/2020   Mediastinal adenopathy 07/18/2020   Lung nodules    Lung nodule 06/14/2020   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   Tobacco dependence 05/29/2020   Exertional chest pain 05/28/2020   Numbness and tingling of left arm and leg 07/01/2012   Hyperlipidemia with target low density lipoprotein (LDL) cholesterol less than 100 mg/dL 07/01/2012   Chest pressure 06/30/2012   GERD (gastroesophageal reflux disease) 06/30/2012    Willow Ora, PTA, [facility] [address], [place],  [address] [phone] 07/10/21, 9:18 PM   Name: Tonya [name] MRN: [id number] Date of Birth: 07-15-1946

## 2021-07-10 NOTE — Therapy (Signed)
[facility] [address] [place], [place], [address] Phone: [phone]   Fax:  [phone]  Occupational Therapy Treatment  Patient Details  Name: Tonya [name] MRN: [id number] Date of Birth: [birth year] Referring Provider (OT): Dr. Alysia Penna   Encounter Date: 07/10/2021   OT End of Session - 07/10/21 0938     Visit Number 10    Number of Visits 17   10+12=22   Date for OT Re-Evaluation 08/24/21    Authorization Type [employer]; Out of pocket has been met in full, patient is covered 100%.  Approved 16 visits 05/29/21-07/28/21    Authorization - Visit Number 10    Authorization - Number of Visits 20    Progress Note Due on Visit 57    OT Start Time 2330    OT Stop Time 1015    OT Time Calculation (min) 41 min    Equipment Utilized During Treatment FOTO completed but NP (score of functional intake measure of 91 for upper extremity; however, primary deficits are cognition, visual-perceptual, and balance)    Activity Tolerance Patient tolerated treatment well    Behavior During Therapy WFL for tasks assessed/performed             Past Medical History:  Diagnosis Date   Adenoma of left adrenal gland    Cancer ([place])    Lung   Colon polyps    hyperplastic   Complication of anesthesia    Very emotional and cries after anesthesia   GERD (gastroesophageal reflux disease)    High blood pressure 03/25/2021   Kidney stone    Liver lesion    Microhematuria     Past Surgical History:  Procedure Laterality Date   ABDOMINAL HYSTERECTOMY     APPENDECTOMY     BRONCHIAL BIOPSY  07/18/2020   Procedure: BRONCHIAL BIOPSIES;  Surgeon: [name], DO;  Location: [facility];  Service: Pulmonary;;   BRONCHIAL BRUSHINGS  07/18/2020   Procedure: BRONCHIAL BRUSHINGS;  Surgeon: [name], DO;  Location: [facility];  Service: Pulmonary;;   BRONCHIAL NEEDLE ASPIRATION BIOPSY  07/18/2020    Procedure: BRONCHIAL NEEDLE ASPIRATION BIOPSIES;  Surgeon: [name], DO;  Location: [facility];  Service: Pulmonary;;   BRONCHIAL WASHINGS  07/18/2020   Procedure: BRONCHIAL WASHINGS;  Surgeon: [name], DO;  Location: [facility];  Service: Pulmonary;;   FIDUCIAL MARKER PLACEMENT  07/18/2020   Procedure: FIDUCIAL MARKER PLACEMENT;  Surgeon: [name], DO;  Location: [facility];  Service: Pulmonary;;   INTERCOSTAL NERVE BLOCK Right 08/26/2020   Procedure: INTERCOSTAL NERVE BLOCK;  Surgeon: [name], MD;  Location: [facility];  Service: Thoracic;  Laterality: Right;   IR THORACENTESIS ASP PLEURAL SPACE W/IMG GUIDE  10/10/2020   NODE DISSECTION Right 08/26/2020   Procedure: NODE DISSECTION;  Surgeon: [name], MD;  Location: [facility];  Service: Thoracic;  Laterality: Right;   THORACENTESIS Right 11/04/2020   Procedure: Mathews Robinsons;  Surgeon: [name], DO;  Location: [facility];  Service: Pulmonary;  Laterality: Right;   THORACENTESIS Right 11/29/2020   Procedure: THORACENTESIS;  Surgeon: [name], DO;  Location: [facility];  Service: Pulmonary;  Laterality: Right;   VIDEO BRONCHOSCOPY WITH ENDOBRONCHIAL NAVIGATION N/A 07/18/2020   Procedure: VIDEO BRONCHOSCOPY WITH ENDOBRONCHIAL NAVIGATION;  Surgeon: [name], DO;  Location: [facility];  Service: Pulmonary;  Laterality: N/A;   VIDEO BRONCHOSCOPY WITH ENDOBRONCHIAL ULTRASOUND  07/18/2020   Procedure: VIDEO BRONCHOSCOPY WITH  ENDOBRONCHIAL ULTRASOUND;  Surgeon: [name], DO;  Location: [facility];  Service: Pulmonary;;    There were no vitals filed for this visit.   Subjective Assessment - 07/10/21 0936     Subjective  Pt reports that she ran out of pain medication.  Pt reports that she is staggering "all over the place today"  "I just want to do it perfect"  Pt reports she is working on cognitive and visual HEP at home.    Pertinent History R parietal lobe ICH and  R frontal SAH.    PMH includes: lung cancer s/p R upper lobectomy, currently undergoing chemo, GERD, HLD, bradycardia    Limitations fall risk, L inattention    Patient Stated Goals be indendepent and return to driving    Currently in Pain? Yes    Pain Score 10-Worst pain ever    Pain Location Hip    Pain Orientation Right    Pain Descriptors / Indicators Sharp    Pain Type Chronic pain    Pain Onset In the past 7 days    Pain Frequency Constant    Aggravating Factors  walking    Pain Relieving Factors pain medications              Alternating Attention Task:  Pt monitoring time to alternate between 2 cognitive tasks every 2 min.  Pt monitored time and alternated between tasks at appropriate times.  Task #1 was horizontal word search with 1 word omitted (despite checking off found words) and Task #2 was planning/organization task to group shopping list with 2 omissions and 1 items placed in incorrect category due to decr attention to detail.  Environmental scanning/navigation in busy, dynamic environment with 12/16 items found initially with 2 found on 2nd pass and needed mod cueing for remaining 2 items.  Pt with 2 stumbles today, but able to recover unassisted.  Pt able to keep track on how many items found.    Discussed difficulties for incr awareness.  Recommended pt not do activities to point of frustration, but stop and then try again later if having significant difficulty.         OT Short Term Goals - 07/10/21 0948       OT SHORT TERM GOAL #1   Title Pt will verbalize understanding of visual compensation strategies for ADLs/IADLs for incr ease/safety.--check STGs 06/28/21    Time 4    Period Weeks    Status Achieved   07/01/21:  needs reinforcement/cues     OT SHORT TERM GOAL #2   Title Pt will perform simple environmental scanning/navigation with at least 90% accuracy and no LOB.    Time 4    Period Weeks    Status On-going   07/01/21:  87%, pt reports that she  continues to bump into objects on the L at home.  07/10/21:  75% in busy environment     OT [place] #3   Title Pt will perform divided attention between at least 1 cognitive and 1 physical task with at least 75% accuracy.    Time 4    Period Weeks    Status On-going   07/01/21:  not met.  07/10/21: alternating attention at approx 75% accuracy.     OT SHORT TERM GOAL #4   Title Pt will be independent with LUE HEP for mild coordination/strength deficits.    Period Weeks    Status On-going   07/01/21:  needs review.  07/10/21:  met  with hand HEP, but  could benefit from HEP for L shoulder              OT Long Term Goals - 07/10/21 0952       OT LONG TERM GOAL #1   Title Pt will verbalize understanding of memory/cognitive compensation strategies for ADLs/IADLs for incr ease/safety.--check LTGs 07/28/21    Time 8    Period Weeks    Status On-going   07/10/21:  needs continued education and reinforcement     OT LONG TERM GOAL #2   Title Pt will perform environmental scanning and navigation in busy, dynamic environment with at least 95% accuracy.    Time 8    Period Weeks    Status On-going   07/10/21:  75%     OT LONG TERM GOAL #3   Title Pt will perform divided attention between at least 1 cognitive and 1 physical task with at least 90% accuracy.    Time 8    Period Weeks    Status New      OT LONG TERM GOAL #4   Title Pt will be independent with HEP for visual scanning/cognition.    Time 8    Period Weeks    Status Achieved                   Plan - 07/10/21 0938     Clinical Impression Statement Progress Note Reporting Period 05/29/21-07/10/21:  Pt is slowly progressing towards goals, but pt continues to demo decr awareness of deficits and demo difficulty with visual-perceptual skills. Pt with difficulty relating deficits to everyday tasks.  Pt would benefit from continued occupational therapy to address continued deficits for improved ADL/IADL safety,  efficiency, and independence.    OT Occupational Profile and History Detailed Assessment- Review of Records and additional review of physical, cognitive, psychosocial history related to current functional performance    Occupational performance deficits (Please refer to evaluation for details): ADL's;IADL's;Leisure    Body Structure / Function / Physical Skills ADL;UE functional use;IADL;Balance;Dexterity;Coordination;FMC;Strength    Cognitive Skills Memory;Attention;Problem Solve;Safety Awareness;Sequencing    Rehab Potential Good    Clinical Decision Making Several treatment options, min-mod task modification necessary    Comorbidities Affecting Occupational Performance: May have comorbidities impacting occupational performance    Modification or Assistance to Complete Evaluation  Min-Moderate modification of tasks or assist with assess necessary to complete eval    OT Frequency 2x / week    OT Duration 8 weeks   +eval   OT Treatment/Interventions Self-care/ADL training;DME and/or AE instruction;Balance training;Therapeutic activities;Cognitive remediation/compensation;Therapeutic exercise;Neuromuscular education;Functional Mobility Training;Visual/perceptual remediation/compensation;Patient/family education    Plan continue with environmental scanning, alternating-divided attention, functional planning, organization, problem-solving, ?theraband/wt. bearing for L shoulder    Consulted and Agree with Plan of Care Patient;Family member/caregiver    Family Member Consulted husband             Patient will benefit from skilled therapeutic intervention in order to improve the following deficits and impairments:   Body Structure / Function / Physical Skills: ADL, UE functional use, IADL, Balance, Dexterity, Coordination, FMC, Strength Cognitive Skills: Memory, Attention, Problem Solve, Safety Awareness, Sequencing     Visit Diagnosis: Attention and concentration deficit  Visuospatial  deficit  Frontal lobe and executive function deficit  Unsteadiness on feet  Other abnormalities of gait and mobility  Muscle weakness (generalized)  Other lack of coordination  Other symptoms and signs 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  Problem List Patient Active Problem List   Diagnosis Date Noted   Swelling of both lower extremities 05/08/2021   Frequent headaches 03/25/2021   Sciatica of left side 03/25/2021   Chronic low back pain 03/25/2021   Anxiety disorder 03/25/2021   Intraparenchymal hematoma of brain 02/27/2021   Stroke, 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[place]) 02/23/2021   Intracranial hemorrhage ([place]) 02/22/2021   Chemotherapy induced neutropenia ([place]) 01/08/2021   Bradycardia 01/01/2021   Hypomagnesemia 12/10/2020   Nausea without vomiting 12/10/2020   Status post thoracentesis    Pleural effusion, right 11/01/2020   Adenocarcinoma of right lung, stage 2 ([place]) 09/11/2020   Encounter for antineoplastic chemotherapy 09/11/2020   S/P Robotic Assited Video Thoracoscopy with Right Upper Lobectomy Lung 08/26/2020   Mediastinal adenopathy 07/18/2020   Lung nodules    Lung nodule 06/14/2020   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   Tobacco dependence 05/29/2020   Exertional chest pain 05/28/2020   Numbness and tingling of left arm and leg 07/01/2012   Hyperlipidemia with target low density lipoprotein (LDL) cholesterol less than 100 mg/dL 07/01/2012   Chest pressure 06/30/2012   GERD (gastroesophageal reflux disease) 06/30/2012    Vianne Bulls, OT/L 07/10/2021, 2:14 PM  [facility] [address] [place], [place], [address] Phone: [phone]   Fax:  [phone]  Name: LIRIO BACH MRN: [id number] Date of Birth: 03-23-1946   Vianne Bulls, OTR/L [facility] [address] [place], [place]  [address] [phone]  phone [phone] 07/10/21 2:14 PM

## 2021-07-15 ENCOUNTER — Other Ambulatory Visit: Payer: Self-pay

## 2021-07-15 ENCOUNTER — Ambulatory Visit: Payer: Medicare HMO | Admitting: Occupational Therapy

## 2021-07-15 ENCOUNTER — Encounter: Payer: Self-pay | Admitting: Rehabilitation

## 2021-07-15 ENCOUNTER — Ambulatory Visit: Payer: Medicare HMO

## 2021-07-15 ENCOUNTER — Ambulatory Visit: Payer: Medicare HMO | Admitting: Rehabilitation

## 2021-07-15 DIAGNOSIS — R41842 Visuospatial deficit: Secondary | ICD-10-CM

## 2021-07-15 DIAGNOSIS — R2681 Unsteadiness on feet: Secondary | ICD-10-CM

## 2021-07-15 DIAGNOSIS — R41840 Attention and concentration deficit: Secondary | ICD-10-CM

## 2021-07-15 DIAGNOSIS — M6281 Muscle weakness (generalized): Secondary | ICD-10-CM

## 2021-07-15 DIAGNOSIS — R41844 Frontal lobe and executive function deficit: Secondary | ICD-10-CM

## 2021-07-15 DIAGNOSIS — R41841 Cognitive communication deficit: Secondary | ICD-10-CM

## 2021-07-15 DIAGNOSIS — R2689 Other abnormalities of gait and mobility: Secondary | ICD-10-CM

## 2021-07-15 DIAGNOSIS — I69118 Other symptoms and signs 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: Secondary | ICD-10-CM

## 2021-07-15 DIAGNOSIS — R278 Other lack of coordination: Secondary | ICD-10-CM

## 2021-07-15 NOTE — Therapy (Signed)
[facility] [address] [place], [place], [address] Phone: [phone]   Fax:  [phone]  Speech Language Pathology Treatment  Patient Details  Name: Tonya [name] MRN: [id number] Date of Birth: 08/07/1946 Referring Provider (SLP): Charlett Blake, MD   Encounter Date: 07/15/2021   End of Session - 07/15/21 1124     Visit Number 9    Number of Visits 17    Date for SLP Re-Evaluation 07/25/21    Authorization Type [employer]    Authorization Time Period auth for 16 visits 9-22 to 11-18    Authorization - Visit Number 8    Authorization - Number of Visits 16    SLP Start Time 1101    SLP Stop Time  1148    SLP Time Calculation (min) 47 min    Activity Tolerance Patient tolerated treatment well             Past Medical History:  Diagnosis Date   Adenoma of left adrenal gland    Cancer ([place])    Lung   Colon polyps    hyperplastic   Complication of anesthesia    Very emotional and cries after anesthesia   GERD (gastroesophageal reflux disease)    High blood pressure 03/25/2021   Kidney stone    Liver lesion    Microhematuria     Past Surgical History:  Procedure Laterality Date   ABDOMINAL HYSTERECTOMY     APPENDECTOMY     BRONCHIAL BIOPSY  07/18/2020   Procedure: BRONCHIAL BIOPSIES;  Surgeon: [name], DO;  Location: [facility];  Service: Pulmonary;;   BRONCHIAL BRUSHINGS  07/18/2020   Procedure: BRONCHIAL BRUSHINGS;  Surgeon: [name], DO;  Location: [facility];  Service: Pulmonary;;   BRONCHIAL NEEDLE ASPIRATION BIOPSY  07/18/2020   Procedure: BRONCHIAL NEEDLE ASPIRATION BIOPSIES;  Surgeon: [name], DO;  Location: [facility];  Service: Pulmonary;;   BRONCHIAL WASHINGS  07/18/2020   Procedure: BRONCHIAL WASHINGS;  Surgeon: [name], DO;  Location: [facility];  Service: Pulmonary;;   FIDUCIAL MARKER PLACEMENT  07/18/2020   Procedure:  FIDUCIAL MARKER PLACEMENT;  Surgeon: [name], DO;  Location: [facility];  Service: Pulmonary;;   INTERCOSTAL NERVE BLOCK Right 08/26/2020   Procedure: INTERCOSTAL NERVE BLOCK;  Surgeon: [name], MD;  Location: [facility];  Service: Thoracic;  Laterality: Right;   IR THORACENTESIS ASP PLEURAL SPACE W/IMG GUIDE  10/10/2020   NODE DISSECTION Right 08/26/2020   Procedure: NODE DISSECTION;  Surgeon: [name], MD;  Location: [facility];  Service: Thoracic;  Laterality: Right;   THORACENTESIS Right 11/04/2020   Procedure: Mathews Robinsons;  Surgeon: [name], DO;  Location: [facility];  Service: Pulmonary;  Laterality: Right;   THORACENTESIS Right 11/29/2020   Procedure: THORACENTESIS;  Surgeon: [name], DO;  Location: [facility];  Service: Pulmonary;  Laterality: Right;   VIDEO BRONCHOSCOPY WITH ENDOBRONCHIAL NAVIGATION N/A 07/18/2020   Procedure: VIDEO BRONCHOSCOPY WITH ENDOBRONCHIAL NAVIGATION;  Surgeon: [name], DO;  Location: [facility];  Service: Pulmonary;  Laterality: N/A;   VIDEO BRONCHOSCOPY WITH ENDOBRONCHIAL ULTRASOUND  07/18/2020   Procedure: VIDEO BRONCHOSCOPY WITH ENDOBRONCHIAL ULTRASOUND;  Surgeon: [name], DO;  Location: [facility];  Service: Pulmonary;;    There were no vitals filed for this visit.   Subjective Assessment - 07/15/21 1103     Subjective "I have less pain"    Currently in Pain? Yes  Pain Score 6     Pain Location Hip                   ADULT SLP TREATMENT - 07/15/21 1104       General Information   Behavior/Cognition Alert;Cooperative;Pleasant mood      Treatment Provided   Treatment provided Cognitive-Linquistic      Cognitive-Linquistic Treatment   Treatment focused on Cognition;Patient/family/caregiver education    Skilled Treatment Pt returned with HEP completed and 100% accurate for following multi-step detailed instructions. SLP targeted completing schedule with written  instructions. Mild SLP cues required (cue to attend to bolded components) to increase comprehension to initiate task. Once patient initiated task, intermittent mod A required to optimize problem solving and aid attention (particularly on left side). Pt reported feeling frustrated in which SLP assistance was benefical to reduce frustration and complete task.      Assessment / Recommendations / Plan   Plan Continue with current plan of care      Progression Toward Goals   Progression toward goals Progressing toward goals                SLP Short Term Goals - 07/15/21 1124       SLP SHORT TERM GOAL #1   Title Pt will use memory compensations for appointments, finances, and other daily activities with rare min A  over 2 sessions    Baseline 06-23-21, 07-01-21    Status Achieved    Target Date 07/15/21   extended as pt started tx two weeks after eval     SLP SHORT TERM GOAL #2   Title Pt will use verbalize and demo accurate problem solving for functional scenarios encountered at home with rare min A over 2 sessions    Baseline 07-01-21, 07-04-21    Status Achieved    Target Date 07/15/21      SLP SHORT TERM GOAL #3   Title Pt will ID and correct errors on structured cognitive tasks with 80% accuracy given rare min A over 2 sessions    Baseline 07-08-21    Status Partially Met    Target Date 07/15/21      SLP SHORT TERM GOAL #4   Title Pt will implement strategy to ensure stove is turned off after cooking for 3/3 opportunities    Baseline 06-23-21, 07-01-21, 07-04-21    Status Achieved    Target Date 07/15/21      SLP SHORT TERM GOAL #5   Title Pt will complete cognitive PROM in first few sessions    Status Achieved    Target Date 07/15/21              SLP Long Term Goals - 07/15/21 1125       SLP LONG TERM GOAL #1   Title Pt will independently manage appointments and bill pay with use of compensations with no errors reported for 3/3 opportunities    Time 6     Period Weeks    Status On-going    Target Date 07/25/21      SLP LONG TERM GOAL #2   Title Pt will use independently demo accurate problem solving for functional scenarios encountered at home over 2 sessions    Time 8    Period Weeks    Status On-going    Target Date 07/25/21      SLP LONG TERM GOAL #3   Title Pt will report improved cognitive linguistic functioning on PROM by 5 points  at last ST session    Time 8    Period Weeks    Status On-going    Target Date 07/25/21              Plan - 07/15/21 1124     Clinical Impression Statement "[place]" was referred for OPST to address cognition secondary to 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 Pt experienced difficulty completing mod complex cognitive tasks targeting attention, following instructions, and problem solving. Pt required intermittent mod A to complete tasks to increase attention to detail, aid problem solving, and correct errors. Given change in baseline cognition, SLP recommends skilled ST intervention to address cognitive linguistic skills to maximize return to PLOF and increase functional independence.    Speech Therapy Frequency 2x / week    Duration 8 weeks    Treatment/Interventions Patient/family education;Functional tasks;Cognitive reorganization;Compensatory techniques;Internal/external aids;SLP instruction and feedback;Environmental controls;Cueing hierarchy;Compensatory strategies    Potential to Achieve Goals Good    SLP Home Exercise Plan provided    Consulted and Agree with Plan of Care Patient;Family member/caregiver             Patient will benefit from skilled therapeutic intervention in order to improve the following deficits and impairments:   Cognitive communication deficit    Problem List Patient Active Problem List   Diagnosis Date Noted   Swelling of both lower extremities 05/08/2021   Frequent headaches 03/25/2021   Sciatica of left side 03/25/2021   Chronic low back pain 03/25/2021    Anxiety disorder 03/25/2021   Intraparenchymal hematoma of brain 02/27/2021   Stroke, 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[place]) 02/23/2021   Intracranial hemorrhage ([place]) 02/22/2021   Chemotherapy induced neutropenia ([place]) 01/08/2021   Bradycardia 01/01/2021   Hypomagnesemia 12/10/2020   Nausea without vomiting 12/10/2020   Status post thoracentesis    Pleural effusion, right 11/01/2020   Adenocarcinoma of right lung, stage 2 ([place]) 09/11/2020   Encounter for antineoplastic chemotherapy 09/11/2020   S/P Robotic Assited Video Thoracoscopy with Right Upper Lobectomy Lung 08/26/2020   Mediastinal adenopathy 07/18/2020   Lung nodules    Lung nodule 06/14/2020   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   Tobacco dependence 05/29/2020   Exertional chest pain 05/28/2020   Numbness and tingling of left arm and leg 07/01/2012   Hyperlipidemia with target low density lipoprotein (LDL) cholesterol less than 100 mg/dL 07/01/2012   Chest pressure 06/30/2012   GERD (gastroesophageal reflux disease) 06/30/2012    Alinda Deem, MA CCC-SLP 07/15/2021, 11:53 AM  [facility] [address] [place], [place], [address] Phone: [phone]   Fax:  [phone]   Name: Tonya [name] MRN: [id number] Date of Birth: 04/08/1946

## 2021-07-15 NOTE — Patient Instructions (Signed)
  Strengthening: Resisted Flexion   Hold tubing with _left____ arm(s) at side. Pull forward and up. Move shoulder through pain-free range of motion. Repeat __10__ times per set.  Do _1_ sessions per day , every other day   Strengthening: Resisted Extension   Hold tubing in _____ hand(s), arm forward. Pull arm back, elbow straight. Repeat _10___ times per set. Do _1-2___ sessions per day, every other day.   Resisted Horizontal Abduction: Bilateral    Elbow Flexion: Resisted   With tubing held in __left____ hand(s) and other end secured under foot, curl arm up as far as possible. Repeat _10___ times per set. Do _1___ sessions per day, every other day.    Elbow Extension: Resisted   Sit in chair with resistive band secured at armrest (or hold with other hand) and ___left____ elbow bent. Straighten elbow. Repeat _10___ times per set.  Do _1-2___ sessions per day, every other day.   Copyright  VHI. All rights reserved.

## 2021-07-15 NOTE — Therapy (Signed)
[facility] [address] [place], [place], [address] Phone: [phone]   Fax:  [phone]  Physical Therapy Treatment  Patient Details  Name: Tonya [name] MRN: [id number] Date of Birth: 09-Nov-1945 No data recorded  Encounter Date: 07/15/2021   PT End of Session - 07/15/21 1342     Visit Number 7    Number of Visits 13    Date for PT Re-Evaluation 08/05/21    Authorization Type Recert 93/2/67 to 12/45/80    Progress Note Due on Visit 10    PT Start Time 0932    PT Stop Time 1015    PT Time Calculation (min) 43 min    Equipment Utilized During Treatment Gait belt    Activity Tolerance Patient tolerated treatment well;No increased pain    Behavior During Therapy WFL for tasks assessed/performed             Past Medical History:  Diagnosis Date   Adenoma of left adrenal gland    Cancer ([place])    Lung   Colon polyps    hyperplastic   Complication of anesthesia    Very emotional and cries after anesthesia   GERD (gastroesophageal reflux disease)    High blood pressure 03/25/2021   Kidney stone    Liver lesion    Microhematuria     Past Surgical History:  Procedure Laterality Date   ABDOMINAL HYSTERECTOMY     APPENDECTOMY     BRONCHIAL BIOPSY  07/18/2020   Procedure: BRONCHIAL BIOPSIES;  Surgeon: [name], DO;  Location: [facility];  Service: Pulmonary;;   BRONCHIAL BRUSHINGS  07/18/2020   Procedure: BRONCHIAL BRUSHINGS;  Surgeon: [name], DO;  Location: [facility];  Service: Pulmonary;;   BRONCHIAL NEEDLE ASPIRATION BIOPSY  07/18/2020   Procedure: BRONCHIAL NEEDLE ASPIRATION BIOPSIES;  Surgeon: [name], DO;  Location: [facility];  Service: Pulmonary;;   BRONCHIAL WASHINGS  07/18/2020   Procedure: BRONCHIAL WASHINGS;  Surgeon: [name], DO;  Location: [facility];  Service: Pulmonary;;   FIDUCIAL MARKER PLACEMENT  07/18/2020   Procedure: FIDUCIAL  MARKER PLACEMENT;  Surgeon: [name], DO;  Location: [facility];  Service: Pulmonary;;   INTERCOSTAL NERVE BLOCK Right 08/26/2020   Procedure: INTERCOSTAL NERVE BLOCK;  Surgeon: [name], MD;  Location: [facility];  Service: Thoracic;  Laterality: Right;   IR THORACENTESIS ASP PLEURAL SPACE W/IMG GUIDE  10/10/2020   NODE DISSECTION Right 08/26/2020   Procedure: NODE DISSECTION;  Surgeon: [name], MD;  Location: [facility];  Service: Thoracic;  Laterality: Right;   THORACENTESIS Right 11/04/2020   Procedure: Mathews Robinsons;  Surgeon: [name], DO;  Location: [facility];  Service: Pulmonary;  Laterality: Right;   THORACENTESIS Right 11/29/2020   Procedure: THORACENTESIS;  Surgeon: [name], DO;  Location: [facility];  Service: Pulmonary;  Laterality: Right;   VIDEO BRONCHOSCOPY WITH ENDOBRONCHIAL NAVIGATION N/A 07/18/2020   Procedure: VIDEO BRONCHOSCOPY WITH ENDOBRONCHIAL NAVIGATION;  Surgeon: [name], DO;  Location: [facility];  Service: Pulmonary;  Laterality: N/A;   VIDEO BRONCHOSCOPY WITH ENDOBRONCHIAL ULTRASOUND  07/18/2020   Procedure: VIDEO BRONCHOSCOPY WITH ENDOBRONCHIAL ULTRASOUND;  Surgeon: [name], DO;  Location: [facility];  Service: Pulmonary;;    There were no vitals filed for this visit.   Subjective Assessment - 07/15/21 0934     Subjective Still having increased R hip pain, has not been walking the last 3 days.    Pertinent  History HTN, lung cancer, GERD, HLD    Limitations Walking;House hold activities;Standing    Patient Stated Goals "I want to improve my balance"    Currently in Pain? Yes    Pain Score 8     Pain Location Hip    Pain Orientation Right    Pain Descriptors / Indicators Aching;Throbbing    Pain Type Acute pain;Chronic pain    Pain Onset 1 to 4 weeks ago    Pain Frequency Constant    Aggravating Factors  unsure    Pain Relieving Factors pain medication, massage at last session                                [facility] Adult PT Treatment/Exercise - 07/15/21 0945       Transfers   Transfers Sit to Stand;Stand to Sit    Sit to Stand 6: Modified independent (Device/Increase time)      Ambulation/Gait   Ambulation/Gait Yes    Ambulation/Gait Assistance 5: Supervision    Ambulation/Gait Assistance Details Continued cues for increased stride length and arm swing.  Demos marked improvement in ability to carryover these cues from session to session.    Ambulation Distance (Feet) 350 Feet    Assistive device None    Gait Pattern Step-through pattern;Decreased stride length;Left foot flat;Trunk flexed;Narrow base of support;Poor foot clearance - left;Poor foot clearance - right    Ambulation Surface Level;Indoor      Neuro Re-ed    Neuro Re-ed Details  High level balance and closed chain strengthening with alt cone taps x 10 reps, then LLE only tapping across then in front to increase time in SLS x 10 reps.      Exercises   Exercises Other Exercises    Other Exercises  Pt continues to have high levels of pain in R hip down to R knee.  Upon palpation, note tightness in IT band and hamstrings.  Performed seated figure 4 piriformis stretch x 30 secs x 2 reps, supine figure 4 pulling thigh towards body for R piriformis stretch x 2 sets of 30 secs, and pulling R knee to L shoulder x 30 secs.  Supine R ITB stretch with strap x 30 secs.  Added these to HEP, see bold section of pt instructions.  Then worked on R hip strengthening in closed chain with RLE in stance, tapping LLE to first then second step then back to ground x 10 reps (with single UE support). Sit<>stand x 10 reps with focus on slow descent -tapping to mat then returning to stand so not fully sitting). Ended with side stepping over shorter orange barriers x 4 x 4 laps at min/guard level with min cues for larger step.      Manual Therapy   Manual Therapy Soft tissue mobilization    Manual therapy comments with pt in  left sidelying with pillow between knees: with foam roll- mild to moderate pressure with rolling along IT band for decreased tightness/pain' then with myofascial release ball: to right flank down to hip/IT band. Most pain reported along greater trochanter with increased time spent there. Pt reported decreased pain afterwards                     PT Education - 07/15/21 1342     Education Details stretches added to HEP    Person(s) Educated Patient    Methods Explanation;Demonstration;Handout    Comprehension Returned  demonstration;Verbalized understanding              PT Short Term Goals - 06/26/21 1653       PT SHORT TERM GOAL #1   Title Pt will be IND with initial HEP in order to indicate improved functional mobility and dec fall risk.  (Target Date:06/19/21)    Baseline 06/26/21: HEP just issued this session    Status Not Met      PT SHORT TERM GOAL #2   Title Pt will improve gait speed to >/=2.65 ft/sec in order to indicate safe community ambulation.    Baseline 06/26/21: 2.55 ft/sec no AD, improved just not to goal level    Status Partially Met      PT SHORT TERM GOAL #3   Title Pt will improve 5TSS to </=14 secs without UE support in order to ind dec fall risk and improved functional strength.    Baseline 06/26/21: 13.94 sec's no UE support from standard height surface    Status Achieved      PT SHORT TERM GOAL #4   Title Pt will improve FGA to >/=20/30  in order to indicate dec fall risk.    Baseline 06/26/21: 17/30 scored today, improved by 1 point from last assessment    Status Not Met      PT SHORT TERM GOAL #5   Title Will assess cognitive TUG and write appropriate LTG to reflect dec fall risk    Baseline 06/26/21: 13.82 sec's no AD while counting backwards from 3. PT to set goals.    Time --    Period --    Status Achieved               PT Long Term Goals - 07/08/21 0925       PT LONG TERM GOAL #1   Title Pt will be IND with final HEP in  order to indicate dec fall risk and improved functional mobility.  (Target Date: 08/05/21)    Time 4    Period Weeks    Status On-going    Target Date 08/05/21      PT LONG TERM GOAL #2   Title Pt will improve gait speed to >/=3.25 ft/sec in order to indicate dec fall risk and improved efficiency of gait.    Baseline 3.18 ft/s (07/08/21)    Time 4    Period Weeks    Status On-going    Target Date 08/05/21      PT LONG TERM GOAL #3   Title Pt will perform 5TSS in </=11.5 secs without UE support in order to ind dec fall risk and improved functional strength.    Baseline 12.72 sec (07/08/21)    Time 4    Period Weeks    Status On-going    Target Date 08/05/21      PT LONG TERM GOAL #4   Title Pt will improve FGA to >/=24/30 in order to indicate dec fall risk.    Baseline 24/30 (07/08/21)    Time 4    Period Weeks    Status Achieved      PT LONG TERM GOAL #5   Title Pt will ambulate >1000' over varying outdoor surfaces at mod I level demonstrating ability to scan L environment/avoid obstacles on L in order to indicate safe community mobility.    Time 4    Period Weeks    Status On-going    Target Date 08/05/21  Plan - 07/15/21 1351     Clinical Impression Statement Skilled session initially focusing on R hip pain.  Most of her pain seems to be muscular in nature because following stretching, she reports marked decrease in pain level.  Provided her with these stretches and did educate her on freezing water bottle for rolling R hip/ITB if not able to purchase roller.  Pt verbalized understanding.  Remainder of session focused on closed chain R hip strengthening and balance with emphasis on SLS.  Pt progressing well.    Personal Factors and Comorbidities Comorbidity 3+    Comorbidities see above    Examination-Activity Limitations Locomotion Level;Squat;Stairs;Stand;Transfers    Examination-Participation Restrictions Community Activity;Driving;Yard Work     [employer] Evolving/Moderate complexity    Rehab Potential Good    PT Frequency 2x / week    PT Duration 4 weeks    PT Treatment/Interventions ADLs/Self Care Home Management;Aquatic Therapy;Gait training;Stair training;Functional mobility training;Therapeutic activities;Therapeutic exercise;Balance training;Neuromuscular re-education;Patient/family education;Passive range of motion;Vestibular;Visual/perceptual remediation/compensation;Manual techniques;Joint Manipulations;Orthotic Fit/Training;Moist Heat;Cryotherapy;Energy conservation;Spinal Manipulations    PT Next Visit Plan How is R hip? focus on standing hip strength, hamstring strength, DF, high level balance on compliant surfaces, head motion, EC, coordination    Consulted and Agree with Plan of Care Patient             Patient will benefit from skilled therapeutic intervention in order to improve the following deficits and impairments:  Abnormal gait, Decreased activity tolerance, Decreased balance, Decreased cognition, Decreased endurance, Decreased mobility, Decreased strength, Impaired perceived functional ability, Impaired sensation, Postural dysfunction  Visit Diagnosis: Unsteadiness on feet  Other abnormalities of gait and mobility  Muscle weakness (generalized)     Problem List Patient Active Problem List   Diagnosis Date Noted   Swelling of both lower extremities 05/08/2021   Frequent headaches 03/25/2021   Sciatica of left side 03/25/2021   Chronic low back pain 03/25/2021   Anxiety disorder 03/25/2021   Intraparenchymal hematoma of brain 02/27/2021   Stroke, 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[place]) 02/23/2021   Intracranial hemorrhage ([place]) 02/22/2021   Chemotherapy induced neutropenia ([place]) 01/08/2021   Bradycardia 01/01/2021   Hypomagnesemia 12/10/2020   Nausea without vomiting 12/10/2020   Status post thoracentesis    Pleural effusion, right 11/01/2020   Adenocarcinoma of right lung, stage 2 ([place])  09/11/2020   Encounter for antineoplastic chemotherapy 09/11/2020   S/P Robotic Assited Video Thoracoscopy with Right Upper Lobectomy Lung 08/26/2020   Mediastinal adenopathy 07/18/2020   Lung nodules    Lung nodule 06/14/2020   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   Tobacco dependence 05/29/2020   Exertional chest pain 05/28/2020   Numbness and tingling of left arm and leg 07/01/2012   Hyperlipidemia with target low density lipoprotein (LDL) cholesterol less than 100 mg/dL 07/01/2012   Chest pressure 06/30/2012   GERD (gastroesophageal reflux disease) 06/30/2012    Cameron Sprang, PT, MPT [facility] [address] [place], [place], [address] Phone: [phone]   Fax:  [phone] 07/15/21, 1:58 PM   Name: Tonya [name] MRN: [id number] Date of Birth: November 22, 1945

## 2021-07-15 NOTE — Patient Instructions (Signed)
Access Code: [email or web address] URL: [email or web address] Date: 07/15/2021 Prepared by: Cameron Sprang  Exercises Standing Single Leg Stance with Counter Support - 1 x daily - 5 x weekly - 1 sets - 3 reps - 15 seconds hold Standing Tandem Balance with Counter Support - 1 x daily - 5 x weekly - 1 sets - 3 reps - 15 hold Romberg Stance Eyes Closed on Foam Pad - 1 x daily - 7 x weekly - 1 sets - 3 reps - 20 secs hold Wide Stance with Eyes Closed and Head Nods on Foam Pad - 1 x daily - 7 x weekly - 1 sets - 10 reps Wide Stance with Eyes Closed and Head Rotation on Foam Pad - 1 x daily - 7 x weekly - 1 sets - 10 reps Supine Figure 4 Piriformis Stretch - 1 x daily - 5 x weekly - 1 sets - 3 reps - 30 hold Seated Piriformis Stretch with Trunk Bend - 1 x daily - 5 x weekly - 1 sets - 3 reps - 30 hold Supine ITB Stretch with Strap - 1 x daily - 5 x weekly - 1 sets - 3 reps - 30 hold Seated Hamstring Stretch - 1 x daily - 5 x weekly - 1 sets - 3 reps - 30 hold

## 2021-07-15 NOTE — Therapy (Signed)
[facility] [address] [place], [place], [address] Phone: [phone]   Fax:  [phone]  Occupational Therapy Treatment  Patient Details  Name: Tonya [name] MRN: [id number] Date of Birth: [birth year] Referring Provider (OT): Dr. Alysia Penna   Encounter Date: 07/15/2021   OT End of Session - 07/15/21 1022     Visit Number 11    Number of Visits 17   10+12=22   Date for OT Re-Evaluation 08/24/21    Authorization Type [employer]; Out of pocket has been met in full, patient is covered 100%.  Approved 16 visits 05/29/21-07/28/21    Authorization - Visit Number 11    Authorization - Number of Visits 20    Progress Note Due on Visit 20    OT Start Time 7253    OT Stop Time 1100    OT Time Calculation (min) 42 min    Equipment Utilized During Treatment FOTO completed but NP (score of functional intake measure of 91 for upper extremity; however, primary deficits are cognition, visual-perceptual, and balance)    Activity Tolerance Patient tolerated treatment well    Behavior During Therapy WFL for tasks assessed/performed             Past Medical History:  Diagnosis Date   Adenoma of left adrenal gland    Cancer ([place])    Lung   Colon polyps    hyperplastic   Complication of anesthesia    Very emotional and cries after anesthesia   GERD (gastroesophageal reflux disease)    High blood pressure 03/25/2021   Kidney stone    Liver lesion    Microhematuria     Past Surgical History:  Procedure Laterality Date   ABDOMINAL HYSTERECTOMY     APPENDECTOMY     BRONCHIAL BIOPSY  07/18/2020   Procedure: BRONCHIAL BIOPSIES;  Surgeon: [name], DO;  Location: [facility];  Service: Pulmonary;;   BRONCHIAL BRUSHINGS  07/18/2020   Procedure: BRONCHIAL BRUSHINGS;  Surgeon: [name], DO;  Location: [facility];  Service: Pulmonary;;   BRONCHIAL NEEDLE ASPIRATION BIOPSY  07/18/2020    Procedure: BRONCHIAL NEEDLE ASPIRATION BIOPSIES;  Surgeon: [name], DO;  Location: [facility];  Service: Pulmonary;;   BRONCHIAL WASHINGS  07/18/2020   Procedure: BRONCHIAL WASHINGS;  Surgeon: [name], DO;  Location: [facility];  Service: Pulmonary;;   FIDUCIAL MARKER PLACEMENT  07/18/2020   Procedure: FIDUCIAL MARKER PLACEMENT;  Surgeon: [name], DO;  Location: [facility];  Service: Pulmonary;;   INTERCOSTAL NERVE BLOCK Right 08/26/2020   Procedure: INTERCOSTAL NERVE BLOCK;  Surgeon: [name], MD;  Location: [facility];  Service: Thoracic;  Laterality: Right;   IR THORACENTESIS ASP PLEURAL SPACE W/IMG GUIDE  10/10/2020   NODE DISSECTION Right 08/26/2020   Procedure: NODE DISSECTION;  Surgeon: [name], MD;  Location: [facility];  Service: Thoracic;  Laterality: Right;   THORACENTESIS Right 11/04/2020   Procedure: Mathews Robinsons;  Surgeon: [name], DO;  Location: [facility];  Service: Pulmonary;  Laterality: Right;   THORACENTESIS Right 11/29/2020   Procedure: THORACENTESIS;  Surgeon: [name], DO;  [facility];  Service: Pulmonary;  Laterality: Right;   VIDEO BRONCHOSCOPY WITH ENDOBRONCHIAL NAVIGATION N/A 07/18/2020   Procedure: VIDEO BRONCHOSCOPY WITH ENDOBRONCHIAL NAVIGATION;  Surgeon: [name], DO;  Location: [facility];  Service: Pulmonary;  Laterality: N/A;   VIDEO BRONCHOSCOPY WITH ENDOBRONCHIAL ULTRASOUND  07/18/2020   Procedure: VIDEO BRONCHOSCOPY WITH  ENDOBRONCHIAL ULTRASOUND;  Surgeon: [name], DO;  Location: [facility];  Service: Pulmonary;;    There were no vitals filed for this visit.   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    PMH includes: lung cancer s/p R upper lobectomy, currently undergoing chemo, GERD, HLD, bradycardia    Limitations fall risk, L inattention     Patient Stated Goals be indendepent and return to driving    Currently in Pain? Yes    Pain Score 6     Pain Location Hip    Pain Orientation Right    Pain Descriptors / Indicators Aching    Pain Type Acute pain    Pain Onset More than a month ago    Pain Frequency Constant    Aggravating Factors  unsure    Pain Relieving Factors pain meds                  Treatment: UBE x 6 mins level 3 for increased strength and endurance Constant therapy :Sequencing everyday items 96% accuracy Alternating symbols with 99% accuracy for increased alternating attention                OT Education - 07/15/21 1047     Education Details yellow theraband exercises, 10-20 reps each, min v.c    Person(s) Educated Patient    Methods Explanation;Handout;Demonstration;Verbal cues    Comprehension Returned demonstration;Verbalized understanding;Verbal cues required              OT Short Term Goals - 07/10/21 0948       OT SHORT TERM GOAL #1   Title Pt will verbalize understanding of visual compensation strategies for ADLs/IADLs for incr ease/safety.--check STGs 06/28/21    Time 4    Period Weeks    Status Achieved   07/01/21:  needs reinforcement/cues     OT SHORT TERM GOAL #2   Title Pt will perform simple environmental scanning/navigation with at least 90% accuracy and no LOB.    Time 4    Period Weeks    Status On-going   07/01/21:  87%, pt reports that she continues to bump into objects on the L at home.  07/10/21:  75% in busy environment     OT [place] #3   Title Pt will perform divided attention between at least 1 cognitive and 1 physical task with at least 75% accuracy.    Time 4    Period Weeks    Status On-going   07/01/21:  not met.  07/10/21: alternating attention at approx 75% accuracy.     OT SHORT TERM GOAL #4   Title Pt will be independent with LUE HEP for mild coordination/strength deficits.    Period Weeks    Status On-going   07/01/21:  needs  review.  07/10/21:  met with hand HEP, but  could benefit from HEP for L shoulder              OT Long Term Goals - 07/10/21 0952       OT LONG TERM GOAL #1   Title Pt will verbalize understanding of memory/cognitive compensation strategies for ADLs/IADLs for incr ease/safety.--check LTGs 07/28/21    Time 8    Period Weeks    Status On-going   07/10/21:  needs continued education and reinforcement     OT LONG  TERM GOAL #2   Title Pt will perform environmental scanning and navigation in busy, dynamic environment with at least 95% accuracy.    Time 8    Period Weeks    Status On-going   07/10/21:  75%     OT LONG TERM GOAL #3   Title Pt will perform divided attention between at least 1 cognitive and 1 physical task with at least 90% accuracy.    Time 8    Period Weeks    Status New      OT LONG TERM GOAL #4   Title Pt will be independent with HEP for visual scanning/cognition.    Time 8    Period Weeks    Status Achieved                   Plan - 07/15/21 1055     Clinical Impression Statement Pt is progressing towards goals. She demonstrates understanding of yellow theraband HEP, but she may benefit from review.    OT Occupational Profile and History Detailed Assessment- Review of Records and additional review of physical, cognitive, psychosocial history related to current functional performance    Occupational performance deficits (Please refer to evaluation for details): ADL's;IADL's;Leisure    Body Structure / Function / Physical Skills ADL;UE functional use;IADL;Balance;Dexterity;Coordination;FMC;Strength    Cognitive Skills Memory;Attention;Problem Solve;Safety Awareness;Sequencing    Rehab Potential Good    Clinical Decision Making Several treatment options, min-mod task modification necessary    Comorbidities Affecting Occupational Performance: May have comorbidities impacting occupational performance    Modification or Assistance to Complete Evaluation   Min-Moderate modification of tasks or assist with assess necessary to complete eval    OT Frequency 2x / week    OT Duration 8 weeks   +eval   OT Treatment/Interventions Self-care/ADL training;DME and/or AE instruction;Balance training;Therapeutic activities;Cognitive remediation/compensation;Therapeutic exercise;Neuromuscular education;Functional Mobility Training;Visual/perceptual remediation/compensation;Patient/family education    Plan reveiw yellow theraband exercises, continue environmental scanning, alternating-divided attention, functional planning, organization, problem-solving,    Consulted and Agree with Plan of Care Patient             Patient will benefit from skilled therapeutic intervention in order to improve the following deficits and impairments:   Body Structure / Function / Physical Skills: ADL, UE functional use, IADL, Balance, Dexterity, Coordination, FMC, Strength Cognitive Skills: Memory, Attention, Problem Solve, Safety Awareness, Sequencing     Visit Diagnosis: Attention and concentration deficit  Visuospatial deficit  Frontal lobe and executive function deficit  Unsteadiness on feet  Other abnormalities of gait and mobility  Muscle weakness (generalized)  Other lack of coordination  Other symptoms and signs 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    Problem List Patient Active Problem List   Diagnosis Date Noted   Swelling of both lower extremities 05/08/2021   Frequent headaches 03/25/2021   Sciatica of left side 03/25/2021   Chronic low back pain 03/25/2021   Anxiety disorder 03/25/2021   Intraparenchymal hematoma of brain 02/27/2021   Stroke, 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[place]) 02/23/2021   Intracranial hemorrhage ([facility]) 02/22/2021   Chemotherapy induced neutropenia ([place]) 01/08/2021   Bradycardia 01/01/2021   Hypomagnesemia 12/10/2020   Nausea without vomiting 12/10/2020   Status post thoracentesis    Pleural  effusion, right 11/01/2020   Adenocarcinoma of right lung, stage 2 ([place]) 09/11/2020   Encounter for antineoplastic chemotherapy 09/11/2020   S/P Robotic Assited Video Thoracoscopy with Right Upper Lobectomy Lung 08/26/2020   Mediastinal adenopathy 07/18/2020   Lung nodules  Lung nodule 06/14/2020   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   Tobacco dependence 05/29/2020   Exertional chest pain 05/28/2020   Numbness and tingling of left arm and leg 07/01/2012   Hyperlipidemia with target low density lipoprotein (LDL) cholesterol less than 100 mg/dL 07/01/2012   Chest pressure 06/30/2012   GERD (gastroesophageal reflux disease) 06/30/2012    Nobuko Gsell, OT/L 07/15/2021, 10:57 AM  [facility] [address] [place], [place], [address] Phone: [phone]   Fax:  [phone]  Name: Tonya [name] MRN: [id number] Date of Birth: [birth year]

## 2021-07-17 ENCOUNTER — Ambulatory Visit: Payer: Medicare HMO | Admitting: Rehabilitation

## 2021-07-17 ENCOUNTER — Encounter: Payer: Self-pay | Admitting: Occupational Therapy

## 2021-07-24 ENCOUNTER — Ambulatory Visit: Payer: Medicare HMO | Admitting: Physical Therapy

## 2021-07-24 ENCOUNTER — Encounter: Payer: Medicare HMO | Admitting: Occupational Therapy

## 2021-07-25 ENCOUNTER — Telehealth: Payer: Self-pay | Admitting: *Deleted

## 2021-07-25 NOTE — Telephone Encounter (Signed)
Patient contacted the office c/o right sided tenderness s/p RATs for lobectomy 08/26/2020. Per patient, for the past week she has been experiencing right sided abdominal tenderness describing the pain as if she is "carrying something". Patient states discomfort is between a 4 and 5 when poking at the region of pain. Advised patient to contact her PCP regarding pain for further evaluation. Patient acknowledges receipt.

## 2021-07-29 ENCOUNTER — Other Ambulatory Visit: Payer: Self-pay

## 2021-07-29 ENCOUNTER — Encounter: Payer: Self-pay | Admitting: Physical Therapy

## 2021-07-29 ENCOUNTER — Ambulatory Visit: Payer: Medicare HMO | Admitting: Occupational Therapy

## 2021-07-29 ENCOUNTER — Ambulatory Visit: Payer: Medicare HMO | Admitting: Physical Therapy

## 2021-07-29 ENCOUNTER — Encounter: Payer: Self-pay | Admitting: Occupational Therapy

## 2021-07-29 DIAGNOSIS — R2681 Unsteadiness on feet: Secondary | ICD-10-CM

## 2021-07-29 DIAGNOSIS — R2689 Other abnormalities of gait and mobility: Secondary | ICD-10-CM

## 2021-07-29 DIAGNOSIS — R41844 Frontal lobe and executive function deficit: Secondary | ICD-10-CM

## 2021-07-29 DIAGNOSIS — M6281 Muscle weakness (generalized): Secondary | ICD-10-CM

## 2021-07-29 DIAGNOSIS — I69154 Hemiplegia and hemiparesis following nontraumatic intracerebral hemorrhage affecting left non-dominant side: Secondary | ICD-10-CM

## 2021-07-29 DIAGNOSIS — I69118 Other symptoms and signs 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: Secondary | ICD-10-CM

## 2021-07-29 DIAGNOSIS — R41842 Visuospatial deficit: Secondary | ICD-10-CM

## 2021-07-29 DIAGNOSIS — R278 Other lack of coordination: Secondary | ICD-10-CM

## 2021-07-29 DIAGNOSIS — R41840 Attention and concentration deficit: Secondary | ICD-10-CM

## 2021-07-29 NOTE — Therapy (Signed)
Tonya [name] [address] [place], [place], [address] Phone: [phone]   Fax:  [phone]  Physical Therapy Treatment  Patient Details  Name: Tonya [name] MRN: [id number] Date of Birth: 1945/11/21 No data recorded  Encounter Date: 07/29/2021   PT End of Session - 07/29/21 0937     Visit Number 8    Number of Visits 13    Date for PT Re-Evaluation 08/05/21    Authorization Type Recert 58/0/99 to 83/38/25    Authorization Time [place] 8 visits from 11/22 through 09/05/21    Authorization - Visit Number 1    Authorization - Number of Visits 8    Progress Note Due on Visit 10    PT Start Time 0932    PT Stop Time 1013    PT Time Calculation (min) 41 min    Equipment Utilized During Treatment Gait belt    Activity Tolerance Patient tolerated treatment well;No increased pain    Behavior During Therapy WFL for tasks assessed/performed             Past Medical History:  Diagnosis Date   Adenoma of left adrenal gland    Cancer ([place])    Lung   Colon polyps    hyperplastic   Complication of anesthesia    Very emotional and cries after anesthesia   GERD (gastroesophageal reflux disease)    High blood pressure 03/25/2021   Kidney stone    Liver lesion    Microhematuria     Past Surgical History:  Procedure Laterality Date   ABDOMINAL HYSTERECTOMY     APPENDECTOMY     BRONCHIAL BIOPSY  07/18/2020   Procedure: BRONCHIAL BIOPSIES;  Surgeon: [name], DO;  Location: [facility];  Service: Pulmonary;;   BRONCHIAL BRUSHINGS  07/18/2020   Procedure: BRONCHIAL BRUSHINGS;  Surgeon: [name], DO;  Location: [facility];  Service: Pulmonary;;   BRONCHIAL NEEDLE ASPIRATION BIOPSY  07/18/2020   Procedure: BRONCHIAL NEEDLE ASPIRATION BIOPSIES;  Surgeon: [name], DO;  Location: [facility];  Service: Pulmonary;;   BRONCHIAL WASHINGS  07/18/2020   Procedure: BRONCHIAL  WASHINGS;  Surgeon: [name], DO;  Location: [facility];  Service: Pulmonary;;   FIDUCIAL MARKER PLACEMENT  07/18/2020   Procedure: FIDUCIAL MARKER PLACEMENT;  Surgeon: [name], DO;  Location: [facility];  Service: Pulmonary;;   INTERCOSTAL NERVE BLOCK Right 08/26/2020   Procedure: INTERCOSTAL NERVE BLOCK;  Surgeon: [name], MD;  Location: [facility];  Service: Thoracic;  Laterality: Right;   IR THORACENTESIS ASP PLEURAL SPACE W/IMG GUIDE  10/10/2020   NODE DISSECTION Right 08/26/2020   Procedure: NODE DISSECTION;  Surgeon: [name], MD;  Location: [facility];  Service: Thoracic;  Laterality: Right;   THORACENTESIS Right 11/04/2020   Procedure: Mathews Robinsons;  Surgeon: [name], DO;  Location: [facility];  Service: Pulmonary;  Laterality: Right;   THORACENTESIS Right 11/29/2020   Procedure: THORACENTESIS;  Surgeon: [name], DO;  Location: [facility];  Service: Pulmonary;  Laterality: Right;   VIDEO BRONCHOSCOPY WITH ENDOBRONCHIAL NAVIGATION N/A 07/18/2020   Procedure: VIDEO BRONCHOSCOPY WITH ENDOBRONCHIAL NAVIGATION;  Surgeon: [name], DO;  Location: [facility];  Service: Pulmonary;  Laterality: N/A;   VIDEO BRONCHOSCOPY WITH ENDOBRONCHIAL ULTRASOUND  07/18/2020   Procedure: VIDEO BRONCHOSCOPY WITH ENDOBRONCHIAL ULTRASOUND;  Surgeon: [name], DO;  Location: [facility];  Service: Pulmonary;;    There were no vitals filed for this visit.  Subjective Assessment - 07/29/21 0934     Subjective No hip pain, more right flank/abdomen pain. No falls. Went for a short trip to [place] with no issues. Has been doing the stretches from last session, however has not tried the frozen water bottle.    Patient is accompained by: Family member    Pertinent History HTN, lung cancer, GERD, HLD    Limitations Walking;House hold activities;Standing    How long can you stand comfortably? >20 mins    How long can you walk comfortably? 30 mins     Currently in Pain? Yes    Pain Score 6     Pain Location Flank    Pain Orientation Right    Pain Descriptors / Indicators Aching;Sore    Pain Type Acute pain    Pain Onset 1 to 4 weeks ago    Pain Frequency Constant    Aggravating Factors  unsure    Pain Relieving Factors pain meds                      [facility] Adult PT Treatment/Exercise - 07/29/21 0939       Transfers   Transfers Sit to Stand;Stand to Sit    Sit to Stand 6: Modified independent (Device/Increase time)    Stand to Sit 6: Modified independent (Device/Increase time)      Ambulation/Gait   Ambulation/Gait Yes    Ambulation/Gait Assistance 5: Supervision    Ambulation Distance (Feet) --   around gym with session   Assistive device None    Gait Pattern Step-through pattern;Decreased stride length;Left foot flat;Trunk flexed;Narrow base of support;Poor foot clearance - left;Poor foot clearance - right    Ambulation Surface Level;Indoor      Self-Care   Self-Care Other Self-Care Comments    Other Self-Care Comments  pt with reports of right flank/abdominal pain for past few weeks near where she had surgery about a year ago. Tried to call surgery office and was told to start with PCP office. Reports her PCP office is closed all week due to [place]. Has appt with her [place] doctor next week. Pt advised to try and call anyway so that she may can leave a message for the on call. Pt stated she would try that, otherwise wait till appt next week/maybe try to move that appt up sooner. no increase in pain reported with activity in session.      Knee/Hip Exercises: Aerobic   Other Aerobic Scifit UE/LE's level 3.0 x 8 minutes with goal >/= 60 steps per minute for strengthening and activity tolerance                 Balance Exercises - 07/29/21 1010       Balance Exercises: Standing   SLS with Vectors Foam/compliant surface;Intermittent upper extremity assist;Other reps (comment);Limitations    SLS with Vectors  Limitations on balance board with 2 tall cones on floor in front: alternating forward, then cross foot taps for 8-10 reps each with cues on stance position, weight shifting and controlled taps. min guard to min assist for balance.    Rockerboard Anterior/posterior;Lateral;EO;EC;30 seconds;Other reps (comment);Limitations;Intermittent UE support    Rockerboard Limitations performed both ways on balance board with intermittent touch to bars for balance: rocking the board with emphasis on tall posture and weight shifitng for rocking the board. Then holding the board steady with EC 30 sec's x 3 reps, cues on posture/weight shifting to maintain balance. min guard to min assist for  balance.    Tandem Gait Forward;Retro;Upper extremity support;Foam/compliant surface;3 reps;Limitations    Tandem Gait Limitations on blue foam beam with light UE support on bars. cues on posture and step placement.    Sidestepping Foam/compliant support;3 reps;Limitations    Sidestepping Limitations on blue foam beam with light to no UE support with cues on step length/height. min guard to min assist for balance.                  PT Short Term Goals - 06/26/21 1653       PT SHORT TERM GOAL #1   Title Pt will be IND with initial HEP in order to indicate improved functional mobility and dec fall risk.  (Target Date:06/19/21)    Baseline 06/26/21: HEP just issued this session    Status Not Met      PT SHORT TERM GOAL #2   Title Pt will improve gait speed to >/=2.65 ft/sec in order to indicate safe community ambulation.    Baseline 06/26/21: 2.55 ft/sec no AD, improved just not to goal level    Status Partially Met      PT SHORT TERM GOAL #3   Title Pt will improve 5TSS to </=14 secs without UE support in order to ind dec fall risk and improved functional strength.    Baseline 06/26/21: 13.94 sec's no UE support from standard height surface    Status Achieved      PT SHORT TERM GOAL #4   Title Pt will improve  FGA to >/=20/30  in order to indicate dec fall risk.    Baseline 06/26/21: 17/30 scored today, improved by 1 point from last assessment    Status Not Met      PT SHORT TERM GOAL #5   Title Will assess cognitive TUG and write appropriate LTG to reflect dec fall risk    Baseline 06/26/21: 13.82 sec's no AD while counting backwards from 3. PT to set goals.    Time --    Period --    Status Achieved               PT Long Term Goals - 07/08/21 0925       PT LONG TERM GOAL #1   Title Pt will be IND with final HEP in order to indicate dec fall risk and improved functional mobility.  (Target Date: 08/05/21)    Time 4    Period Weeks    Status On-going    Target Date 08/05/21      PT LONG TERM GOAL #2   Title Pt will improve gait speed to >/=3.25 ft/sec in order to indicate dec fall risk and improved efficiency of gait.    Baseline 3.18 ft/s (07/08/21)    Time 4    Period Weeks    Status On-going    Target Date 08/05/21      PT LONG TERM GOAL #3   Title Pt will perform 5TSS in </=11.5 secs without UE support in order to ind dec fall risk and improved functional strength.    Baseline 12.72 sec (07/08/21)    Time 4    Period Weeks    Status On-going    Target Date 08/05/21      PT LONG TERM GOAL #4   Title Pt will improve FGA to >/=24/30 in order to indicate dec fall risk.    Baseline 24/30 (07/08/21)    Time 4    Period Weeks    Status Achieved  PT LONG TERM GOAL #5   Title Pt will ambulate >1000' over varying outdoor surfaces at mod I level demonstrating ability to scan L environment/avoid obstacles on L in order to indicate safe community mobility.    Time 4    Period Weeks    Status On-going    Target Date 08/05/21                   Plan - 07/29/21 8182     Clinical Impression Statement Today's skilled session continued to focus on strengthening and balance training with no issues/increased pain reported. The pt is making steady progress toward goals  and should benefit from continued PT to progress toward unmet goals.    Personal Factors and Comorbidities Comorbidity 3+    Comorbidities see above    Examination-Activity Limitations Locomotion Level;Squat;Stairs;Stand;Transfers    Examination-Participation Restrictions Community Activity;Driving;Yard Work    [employer] Evolving/Moderate complexity    Rehab Potential Good    PT Frequency 2x / week    PT Duration 4 weeks    PT Treatment/Interventions ADLs/Self Care Home Management;Aquatic Therapy;Gait training;Stair training;Functional mobility training;Therapeutic activities;Therapeutic exercise;Balance training;Neuromuscular re-education;Patient/family education;Passive range of motion;Vestibular;Visual/perceptual remediation/compensation;Manual techniques;Joint Manipulations;Orthotic Fit/Training;Moist Heat;Cryotherapy;Energy conservation;Spinal Manipulations    PT Next Visit Plan focus on standing hip strength, hamstring strength, DF, high level balance on compliant surfaces, head motion, EC, coordination    Consulted and Agree with Plan of Care Patient             Patient will benefit from skilled therapeutic intervention in order to improve the following deficits and impairments:  Abnormal gait, Decreased activity tolerance, Decreased balance, Decreased cognition, Decreased endurance, Decreased mobility, Decreased strength, Impaired perceived functional ability, Impaired sensation, Postural dysfunction  Visit Diagnosis: Other abnormalities of gait and mobility  Muscle weakness (generalized)  Unsteadiness on feet     Problem List Patient Active Problem List   Diagnosis Date Noted   Swelling of both lower extremities 05/08/2021   Frequent headaches 03/25/2021   Sciatica of left side 03/25/2021   Chronic low back pain 03/25/2021   Anxiety disorder 03/25/2021   Intraparenchymal hematoma of brain 02/27/2021   Stroke, 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    Intracranial hemorrhage ([place]) 02/22/2021   Chemotherapy induced neutropenia ([place]) 01/08/2021   Bradycardia 01/01/2021   Hypomagnesemia 12/10/2020   Nausea without vomiting 12/10/2020   Status post thoracentesis    Pleural effusion, right 11/01/2020   Adenocarcinoma of right lung, stage 2 ([place]) 09/11/2020   Encounter for antineoplastic chemotherapy 09/11/2020   S/P Robotic Assited Video Thoracoscopy with Right Upper Lobectomy Lung 08/26/2020   Mediastinal adenopathy 07/18/2020   Lung nodules    Lung nodule 06/14/2020   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   Tobacco dependence 05/29/2020   Exertional chest pain 05/28/2020   Numbness and tingling of left arm and leg 07/01/2012   Hyperlipidemia with target low density lipoprotein (LDL) cholesterol less than 100 mg/dL 07/01/2012   Chest pressure 06/30/2012   GERD (gastroesophageal reflux disease) 06/30/2012    Willow Ora, PTA, [facility] [address], [place], [place] [address] [phone] 07/29/21, 1:53 PM   Name: Tonya [name] MRN: [id number] Date of Birth: 01/06/1946

## 2021-07-29 NOTE — Therapy (Addendum)
[facility] [address] [place], [place], [address] Phone: [phone]   Fax:  [phone]  Occupational Therapy Treatment  Patient Details  Name: Tonya [name] MRN: [id number] Date of Birth: [birth year] Referring Provider (OT): Dr. Alysia Penna   Encounter Date: 07/29/2021   OT End of Session - 07/29/21 1029     Visit Number 12    Number of Visits 17   10+12=22   Date for OT Re-Evaluation 08/24/21    Authorization Type [employer]; Out of pocket has been met in full, patient is covered 100%.  Approved 16 visits 05/29/21-07/28/21    Authorization - Visit Number 12    Authorization - Number of Visits 20    Progress Note Due on Visit 20    OT Start Time 3149    OT Stop Time 1058    OT Time Calculation (min) 40 min    Equipment Utilized During Treatment FOTO completed but NP (score of functional intake measure of 91 for upper extremity; however, primary deficits are cognition, visual-perceptual, and balance)    Activity Tolerance Patient tolerated treatment well    Behavior During Therapy WFL for tasks assessed/performed             Past Medical History:  Diagnosis Date   Adenoma of left adrenal gland    Cancer ([facility])    Lung   Colon polyps    hyperplastic   Complication of anesthesia    Very emotional and cries after anesthesia   GERD (gastroesophageal reflux disease)    High blood pressure 03/25/2021   Kidney stone    Liver lesion    Microhematuria     Past Surgical History:  Procedure Laterality Date   ABDOMINAL HYSTERECTOMY     APPENDECTOMY     BRONCHIAL BIOPSY  07/18/2020   Procedure: BRONCHIAL BIOPSIES;  Surgeon: [name], DO;  Location: [facility];  Service: Pulmonary;;   BRONCHIAL BRUSHINGS  07/18/2020   Procedure: BRONCHIAL BRUSHINGS;  Surgeon: [name], DO;  Location: [facility];  Service: Pulmonary;;   BRONCHIAL NEEDLE ASPIRATION BIOPSY  07/18/2020    Procedure: BRONCHIAL NEEDLE ASPIRATION BIOPSIES;  Surgeon: [name], DO;  Location: [facility];  Service: Pulmonary;;   BRONCHIAL WASHINGS  07/18/2020   Procedure: BRONCHIAL WASHINGS;  Surgeon: [name], DO;  Location: [facility];  Service: Pulmonary;;   FIDUCIAL MARKER PLACEMENT  07/18/2020   Procedure: FIDUCIAL MARKER PLACEMENT;  Surgeon: [name], DO;  Location: [facility];  Service: Pulmonary;;   INTERCOSTAL NERVE BLOCK Right 08/26/2020   Procedure: INTERCOSTAL NERVE BLOCK;  Surgeon: [name], MD;  Location: [facility];  Service: Thoracic;  Laterality: Right;   IR THORACENTESIS ASP PLEURAL SPACE W/IMG GUIDE  10/10/2020   NODE DISSECTION Right 08/26/2020   Procedure: NODE DISSECTION;  Surgeon: [name], MD;  Location: [facility];  Service: Thoracic;  Laterality: Right;   THORACENTESIS Right 11/04/2020   Procedure: Mathews Robinsons;  Surgeon: [name], DO;  Location: [facility];  Service: Pulmonary;  Laterality: Right;   THORACENTESIS Right 11/29/2020   Procedure: THORACENTESIS;  Surgeon: [name], DO;  Location[facility];  Service: Pulmonary;  Laterality: Right;   VIDEO BRONCHOSCOPY WITH ENDOBRONCHIAL NAVIGATION N/A 07/18/2020   Procedure: VIDEO BRONCHOSCOPY WITH ENDOBRONCHIAL NAVIGATION;  Surgeon: [name], DO;  Location: [facility];  Service: Pulmonary;  Laterality: N/A;   VIDEO BRONCHOSCOPY WITH ENDOBRONCHIAL ULTRASOUND  07/18/2020   Procedure: VIDEO BRONCHOSCOPY WITH  ENDOBRONCHIAL ULTRASOUND;  Surgeon: [name], DO;  Location: [facility];  Service: Pulmonary;;    There were no vitals filed for this visit.   Subjective Assessment - 07/29/21 1043     Subjective  Pt reports side in her right side the last 2 weeks    Pertinent History R parietal lobe ICH and R frontal SAH.    PMH includes: lung cancer s/p R upper lobectomy, currently undergoing chemo, GERD, HLD, bradycardia    Patient Stated Goals be  indendepent and return to driving    Currently in Pain? Yes    Pain Score 6     Pain Location Rib cage    Pain Orientation Right    Pain Descriptors / Indicators Aching    Pain Type Acute pain    Pain Onset 1 to 4 weeks ago    Pain Frequency Constant    Aggravating Factors  unsure    Pain Relieving Factors stretching                 Treatment: supine on mat for lower trunk rotation, and closed chain shoulder flexion followed by cat and cow positions min facilitation/ v.c for gentle stretch. Pt was shown how to stretch with hands on tabletop rocking forwards and backwards , min facilitation/ v.c Alternating words task for alternating attention level 2 ,80% accuracy Copying small peg design with LUE for visual skills and fine motor coordination with good accuracy.                   OT Short Term Goals - 07/10/21 0948       OT SHORT TERM GOAL #1   Title Pt will verbalize understanding of visual compensation strategies for ADLs/IADLs for incr ease/safety.--check STGs 06/28/21    Time 4    Period Weeks    Status Achieved   07/01/21:  needs reinforcement/cues     OT SHORT TERM GOAL #2   Title Pt will perform simple environmental scanning/navigation with at least 90% accuracy and no LOB.    Time 4    Period Weeks    Status On-going   07/01/21:  87%, pt reports that she continues to bump into objects on the L at home.  07/10/21:  75% in busy environment     OT [place] #3   Title Pt will perform divided attention between at least 1 cognitive and 1 physical task with at least 75% accuracy.    Time 4    Period Weeks    Status On-going   07/01/21:  not met.  07/10/21: alternating attention at approx 75% accuracy.     OT SHORT TERM GOAL #4   Title Pt will be independent with LUE HEP for mild coordination/strength deficits.    Period Weeks    Status On-going   07/01/21:  needs review.  07/10/21:  met with hand HEP, but  could benefit from HEP for L shoulder               OT Long Term Goals - 07/29/21 1040       OT LONG TERM GOAL #1   Title Pt will verbalize understanding of memory/cognitive compensation strategies for ADLs/IADLs for incr ease/safety.--check LTGs 07/28/21    Time 8    Period Weeks    Status On-going   07/10/21:  needs continued education and reinforcement     OT LONG TERM GOAL #2   Title Pt will perform environmental scanning and navigation  in busy, dynamic environment with at least 95% accuracy.    Time 8    Period Weeks    Status On-going   07/10/21:  75%     OT LONG TERM GOAL #3   Title Pt will perform divided attention between at least 1 cognitive and 1 physical task with at least 90% accuracy.    Time 8    Period Weeks    Status New      OT LONG TERM GOAL #4   Title Pt will be independent with HEP for visual scanning/cognition.    Time 8    Period Weeks    Status Achieved                   Plan - 07/29/21 1031     Clinical Impression Statement Pt is progressing towards goals. She reports new pain in right side today. She is not sure if it is related to hx of surgery for lung CA, or if it is musckuloskeletal. Pt follows up with CA MD next week.    OT Occupational Profile and History Detailed Assessment- Review of Records and additional review of physical, cognitive, psychosocial history related to current functional performance    Occupational performance deficits (Please refer to evaluation for details): ADL's;IADL's;Leisure    Body Structure / Function / Physical Skills ADL;UE functional use;IADL;Balance;Dexterity;Coordination;FMC;Strength    Cognitive Skills Memory;Attention;Problem Solve;Safety Awareness;Sequencing    Rehab Potential Good    Clinical Decision Making Several treatment options, min-mod task modification necessary    Comorbidities Affecting Occupational Performance: May have comorbidities impacting occupational performance    Modification or Assistance to Complete Evaluation   Min-Moderate modification of tasks or assist with assess necessary to complete eval    OT Frequency 2x / week    OT Duration 8 weeks   +eval   OT Treatment/Interventions Self-care/ADL training;DME and/or AE instruction;Balance training;Therapeutic activities;Cognitive remediation/compensation;Therapeutic exercise;Neuromuscular education;Functional Mobility Training;Visual/perceptual remediation/compensation;Patient/family education    Plan check goals as pt is only scheduled for 2 more OT visits, monitor pain, work towards unmet goals    Consulted and Agree with Plan of Care Patient             Patient will benefit from skilled therapeutic intervention in order to improve the following deficits and impairments:   Body Structure / Function / Physical Skills: ADL, UE functional use, IADL, Balance, Dexterity, Coordination, FMC, Strength Cognitive Skills: Memory, Attention, Problem Solve, Safety Awareness, Sequencing     Visit Diagnosis: Attention and concentration deficit  Visuospatial deficit  Frontal lobe and executive function deficit  Other abnormalities of gait and mobility  Muscle weakness (generalized)  Other lack of coordination  Other symptoms and signs 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)    Problem List Patient Active Problem List   Diagnosis Date Noted   Swelling of both lower extremities 05/08/2021   Frequent headaches 03/25/2021   Sciatica of left side 03/25/2021   Chronic low back pain 03/25/2021   Anxiety disorder 03/25/2021   Intraparenchymal hematoma of brain 02/27/2021   Stroke, 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HCC) 02/23/2021   Intracranial hemorrhage (HCC) 02/22/2021   Chemotherapy induced neutropenia (HCC) 01/08/2021   Bradycardia 01/01/2021   Hypomagnesemia 12/10/2020   Nausea without vomiting 12/10/2020    Status post thoracentesis    Pleural effusion, right 11/01/2020   Adenocarcinoma of right lung, stage 2 (HCC) 09/11/2020   Encounter for antineoplastic chemotherapy 09/11/2020  S/P Robotic Assited Video Thoracoscopy with Right Upper Lobectomy Lung 08/26/2020   Mediastinal adenopathy 07/18/2020   Lung nodules    Lung nodule 06/14/2020   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   Tobacco dependence 05/29/2020   Exertional chest pain 05/28/2020   Numbness and tingling of left arm and leg 07/01/2012   Hyperlipidemia with target low density lipoprotein (LDL) cholesterol less than 100 mg/dL 07/01/2012   Chest pressure 06/30/2012   GERD (gastroesophageal reflux disease) 06/30/2012    Chandani Rogowski, OT/L 07/29/2021, 12:25 PM  [facility] [address] [place], [place], [address] Phone: [phone]   Fax:  [phone]  Name: Tonya [name] MRN: [id number] Date of Birth: 10/12/1945

## 2021-07-30 ENCOUNTER — Encounter: Payer: Self-pay | Admitting: Speech Pathology

## 2021-07-30 ENCOUNTER — Telehealth: Payer: Self-pay | Admitting: Internal Medicine

## 2021-07-30 ENCOUNTER — Ambulatory Visit: Payer: Medicare HMO | Admitting: Speech Pathology

## 2021-07-30 ENCOUNTER — Encounter: Payer: Medicare HMO | Admitting: Psychology

## 2021-07-30 DIAGNOSIS — R2681 Unsteadiness on feet: Secondary | ICD-10-CM | POA: Diagnosis not present

## 2021-07-30 DIAGNOSIS — R41841 Cognitive communication deficit: Secondary | ICD-10-CM

## 2021-07-30 NOTE — Therapy (Signed)
[facility] [address] [place], [place], [address] Phone: [phone]   Fax:  [phone]  Speech Language Pathology Treatment & Progress Note  Patient Details  Name: Tonya [name] MRN: [id number] Date of Birth: Oct 03, 1945 Referring Provider (SLP): Charlett Blake, MD   Encounter Date: 07/30/2021   End of Session - 07/30/21 0851     Visit Number 10    Number of Visits 17    Date for SLP Re-Evaluation 07/25/21    Authorization Type [employer]    Authorization Time Period auth for 16 visits 9-22 to 11-18    Authorization - Visit Number 9    Authorization - Number of Visits 16    SLP Start Time [phone]    SLP Stop Time  0930    SLP Time Calculation (min) 39 min    Activity Tolerance Patient tolerated treatment well             Past Medical History:  Diagnosis Date   Adenoma of left adrenal gland    Cancer ([facility])    Lung   Colon polyps    hyperplastic   Complication of anesthesia    Very emotional and cries after anesthesia   GERD (gastroesophageal reflux disease)    High blood pressure 03/25/2021   Kidney stone    Liver lesion    Microhematuria     Past Surgical History:  Procedure Laterality Date   ABDOMINAL HYSTERECTOMY     APPENDECTOMY     BRONCHIAL BIOPSY  07/18/2020   Procedure: BRONCHIAL BIOPSIES;  Surgeon: [name], DO;  Location: [facility];  Service: Pulmonary;;   BRONCHIAL BRUSHINGS  07/18/2020   Procedure: BRONCHIAL BRUSHINGS;  Surgeon: [name], DO;  Location: [facility];  Service: Pulmonary;;   BRONCHIAL NEEDLE ASPIRATION BIOPSY  07/18/2020   Procedure: BRONCHIAL NEEDLE ASPIRATION BIOPSIES;  Surgeon: [name], DO;  Location: [facility];  Service: Pulmonary;;   BRONCHIAL WASHINGS  07/18/2020   Procedure: BRONCHIAL WASHINGS;  Surgeon: [name], DO;  Location: [facility];  Service: Pulmonary;;   FIDUCIAL MARKER PLACEMENT   07/18/2020   Procedure: FIDUCIAL MARKER PLACEMENT;  Surgeon: [name], DO;  Location: [facility];  Service: Pulmonary;;   INTERCOSTAL NERVE BLOCK Right 08/26/2020   Procedure: INTERCOSTAL NERVE BLOCK;  Surgeon: [name], MD;  Location: [facility];  Service: Thoracic;  Laterality: Right;   IR THORACENTESIS ASP PLEURAL SPACE W/IMG GUIDE  10/10/2020   NODE DISSECTION Right 08/26/2020   Procedure: NODE DISSECTION;  Surgeon: [name], MD;  Location: [facility];  Service: Thoracic;  Laterality: Right;   THORACENTESIS Right 11/04/2020   Procedure: Mathews Robinsons;  Surgeon: [name], DO;  Location: [facility];  Service: Pulmonary;  Laterality: Right;   THORACENTESIS Right 11/29/2020   Procedure: THORACENTESIS;  Surgeon: [name], DO;  Location: [facility];  Service: Pulmonary;  Laterality: Right;   VIDEO BRONCHOSCOPY WITH ENDOBRONCHIAL NAVIGATION N/A 07/18/2020   Procedure: VIDEO BRONCHOSCOPY WITH ENDOBRONCHIAL NAVIGATION;  Surgeon: [name], DO;  Location: [facility];  Service: Pulmonary;  Laterality: N/A;   VIDEO BRONCHOSCOPY WITH ENDOBRONCHIAL ULTRASOUND  07/18/2020   Procedure: VIDEO BRONCHOSCOPY WITH ENDOBRONCHIAL ULTRASOUND;  Surgeon: [name], DO;  Location: [facility];  Service: Pulmonary;;    There were no vitals filed for this visit.   Subjective Assessment - 07/30/21 0900     Subjective "I have had a long journey."    Currently in  Pain? Yes    Pain Score 5     Pain Location Rib cage    Pain Orientation Right    Pain Descriptors / Indicators Aching    Pain Type Acute pain    Pain Onset 1 to 4 weeks ago    Pain Frequency Constant                   ADULT SLP TREATMENT - 07/30/21 0913       General Information   Behavior/Cognition Alert;Cooperative;Pleasant mood      Treatment Provided   Treatment provided Cognitive-Linquistic      Cognitive-Linquistic Treatment   Treatment focused on  Cognition;Patient/family/caregiver education    Skilled Treatment Pt returned with HEP mostly completed. SLP worked with pt to complete "Completing a Schedule" from HEP. SLP encouraged pt to write down all items in bold on a separate sheet of paper. Pt required min-to-mod verbal cueing to complete task. Instead of writing items down, but attempted to organize items prior to writing them down. SLP encouraged pt to "just write bolded items out and organize them later". Next, SLP suggested writing down the "known times" and times places open. Then, SLP had pt read through for detail. (PQRST method). Pt was able to complete schedule with minA in ~15 minutes. Pt reported, "wow, this would have taken me 3-4 hours by myself." Rec continued targeting of attention and organization.      Assessment / Recommendations / Plan   Plan Continue with current plan of care      Progression Toward Goals   Progression toward goals Progressing toward goals                SLP Short Term Goals - 07/15/21 1124       SLP SHORT TERM GOAL #1   Title Pt will use memory compensations for appointments, finances, and other daily activities with rare min A  over 2 sessions    Baseline 06-23-21, 07-01-21    Status Achieved    Target Date 07/15/21   extended as pt started tx two weeks after eval     SLP SHORT TERM GOAL #2   Title Pt will use verbalize and demo accurate problem solving for functional scenarios encountered at home with rare min A over 2 sessions    Baseline 07-01-21, 07-04-21    Status Achieved    Target Date 07/15/21      SLP SHORT TERM GOAL #3   Title Pt will ID and correct errors on structured cognitive tasks with 80% accuracy given rare min A over 2 sessions    Baseline 07-08-21    Status Partially Met    Target Date 07/15/21      SLP SHORT TERM GOAL #4   Title Pt will implement strategy to ensure stove is turned off after cooking for 3/3 opportunities    Baseline 06-23-21, 07-01-21, 07-04-21     Status Achieved    Target Date 07/15/21      SLP SHORT TERM GOAL #5   Title Pt will complete cognitive PROM in first few sessions    Status Achieved    Target Date 07/15/21              SLP Long Term Goals - 07/15/21 1125       SLP LONG TERM GOAL #1   Title Pt will independently manage appointments and bill pay with use of compensations with no errors reported for 3/3 opportunities    Time  6    Period Weeks    Status On-going    Target Date 07/25/21      SLP LONG TERM GOAL #2   Title Pt will use independently demo accurate problem solving for functional scenarios encountered at home over 2 sessions    Time 8    Period Weeks    Status On-going    Target Date 07/25/21      SLP LONG TERM GOAL #3   Title Pt will report improved cognitive linguistic functioning on PROM by 5 points at last ST session    Time 8    Period Weeks    Status On-going    Target Date 07/25/21              Plan - 07/30/21 3903     Clinical Impression Statement "Aaren" was referred for OPST to address cognition secondary to 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 Pt experienced difficulty completing mod complex cognitive tasks targeting attention, following instructions, and problem solving. Pt required intermittent mod A to complete tasks to increase attention to detail, aid problem solving, and correct errors. Given change in baseline cognition, SLP recommends skilled ST intervention to address cognitive linguistic skills to maximize return to PLOF and increase functional independence.    Speech Therapy Frequency 2x / week    Duration 8 weeks    Treatment/Interventions Patient/family education;Functional tasks;Cognitive reorganization;Compensatory techniques;Internal/external aids;SLP instruction and feedback;Environmental controls;Cueing hierarchy;Compensatory strategies    Potential to Achieve Goals Good    SLP Home Exercise Plan provided    Consulted and Agree with Plan of Care  Patient;Family member/caregiver             Patient will benefit from skilled therapeutic intervention in order to improve the following deficits and impairments:   Cognitive communication deficit    Problem List Patient Active Problem List   Diagnosis Date Noted   Swelling of both lower extremities 05/08/2021   Frequent headaches 03/25/2021   Sciatica of left side 03/25/2021   Chronic low back pain 03/25/2021   Anxiety disorder 03/25/2021   Intraparenchymal hematoma of brain 02/27/2021   Stroke, 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[facility]) 02/23/2021   Intracranial hemorrhage ([facility]) 02/22/2021   Chemotherapy induced neutropenia ([facility]) 01/08/2021   Bradycardia 01/01/2021   Hypomagnesemia 12/10/2020   Nausea without vomiting 12/10/2020   Status post thoracentesis    Pleural effusion, right 11/01/2020   Adenocarcinoma of right lung, stage 2 ([facility]) 09/11/2020   Encounter for antineoplastic chemotherapy 09/11/2020   S/P Robotic Assited Video Thoracoscopy with Right Upper Lobectomy Lung 08/26/2020   Mediastinal adenopathy 07/18/2020   Lung nodules    Lung nodule 06/14/2020   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   Tobacco dependence 05/29/2020   Exertional chest pain 05/28/2020   Numbness and tingling of left arm and leg 07/01/2012   Hyperlipidemia with target low density lipoprotein (LDL) cholesterol less than 100 mg/dL 07/01/2012   Chest pressure 06/30/2012   GERD (gastroesophageal reflux disease) 06/30/2012   Speech Therapy Progress Note  Dates of Reporting Period: 05/29/21 to present  Subjective Statement: "I have always been independent and my independence is important to me."  Objective: See previous notes.  Goal Update: Progressing towards all goals.  Plan: Cont to address attention, memory, and organization.  Reason Skilled Services are Required: SLP rec skilled ST services to address cognitive impairment associated to maximize functional independence.    Verdene Lennert,  [place] 07/30/2021, 10:18 AM  [facility] [address] [place], [place],  [address] Phone: [phone]   Fax:  [phone]   Name: Tonya [name] MRN: [id number] Date of Birth: 1946-04-01

## 2021-07-30 NOTE — Telephone Encounter (Signed)
Rescheduled patient per scheduling message. Patient is aware.

## 2021-08-05 ENCOUNTER — Other Ambulatory Visit: Payer: Self-pay

## 2021-08-05 ENCOUNTER — Ambulatory Visit: Payer: Medicare HMO | Admitting: Occupational Therapy

## 2021-08-05 ENCOUNTER — Encounter: Payer: Self-pay | Admitting: Occupational Therapy

## 2021-08-05 ENCOUNTER — Ambulatory Visit: Payer: Medicare HMO

## 2021-08-05 DIAGNOSIS — I69118 Other symptoms and signs 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: Secondary | ICD-10-CM

## 2021-08-05 DIAGNOSIS — R2681 Unsteadiness on feet: Secondary | ICD-10-CM

## 2021-08-05 DIAGNOSIS — I69154 Hemiplegia and hemiparesis following nontraumatic intracerebral hemorrhage affecting left non-dominant side: Secondary | ICD-10-CM

## 2021-08-05 DIAGNOSIS — R41844 Frontal lobe and executive function deficit: Secondary | ICD-10-CM

## 2021-08-05 DIAGNOSIS — R278 Other lack of coordination: Secondary | ICD-10-CM

## 2021-08-05 DIAGNOSIS — R41840 Attention and concentration deficit: Secondary | ICD-10-CM

## 2021-08-05 DIAGNOSIS — R41842 Visuospatial deficit: Secondary | ICD-10-CM

## 2021-08-05 NOTE — Therapy (Signed)
[facility] [address] [place], [place], [address] Phone: [phone]   Fax:  [phone]  Occupational Therapy Treatment  Patient Details  Name: Tonya [name] MRN: [id number] Date of Birth: Dec 27, 1945 Referring Provider (OT): Dr. Alysia Penna   Encounter Date: 08/05/2021   OT End of Session - 08/05/21 0938     Visit Number 13    Number of Visits 17   10+12=22   Date for OT Re-Evaluation 08/24/21    Authorization Type [employer]; Out of pocket has been met in full, patient is covered 100%.  Approved 16 visits 05/29/21-07/28/21    Authorization - Visit Number 39    Authorization - Number of Visits 20    Progress Note Due on Visit 20    OT Start Time 0539    OT Stop Time 7673    OT Time Calculation (min) 40 min    Equipment Utilized During Treatment FOTO completed but NP (score of functional intake measure of 91 for upper extremity; however, primary deficits are cognition, visual-perceptual, and balance)    Activity Tolerance Patient tolerated treatment well    Behavior During Therapy WFL for tasks assessed/performed             Past Medical History:  Diagnosis Date   Adenoma of left adrenal gland    Cancer ([place])    Lung   Colon polyps    hyperplastic   Complication of anesthesia    Very emotional and cries after anesthesia   GERD (gastroesophageal reflux disease)    High blood pressure 03/25/2021   Kidney stone    Liver lesion    Microhematuria     Past Surgical History:  Procedure Laterality Date   ABDOMINAL HYSTERECTOMY     APPENDECTOMY     BRONCHIAL BIOPSY  07/18/2020   Procedure: BRONCHIAL BIOPSIES;  Surgeon: [name], DO;  Location: [facility];  Service: Pulmonary;;   BRONCHIAL BRUSHINGS  07/18/2020   Procedure: BRONCHIAL BRUSHINGS;  Surgeon: [name], DO;  Location: [facility];  Service: Pulmonary;;   BRONCHIAL NEEDLE ASPIRATION BIOPSY  07/18/2020    Procedure: BRONCHIAL NEEDLE ASPIRATION BIOPSIES;  Surgeon: [name], DO;  Location: [facility];  Service: Pulmonary;;   BRONCHIAL WASHINGS  07/18/2020   Procedure: BRONCHIAL WASHINGS;  Surgeon: [name], DO;  Location: [facility];  Service: Pulmonary;;   FIDUCIAL MARKER PLACEMENT  07/18/2020   Procedure: FIDUCIAL MARKER PLACEMENT;  Surgeon: [name], DO;  Location: [facility];  Service: Pulmonary;;   INTERCOSTAL NERVE BLOCK Right 08/26/2020   Procedure: INTERCOSTAL NERVE BLOCK;  Surgeon: [name], MD;  Location: [facility];  Service: Thoracic;  Laterality: Right;   IR THORACENTESIS ASP PLEURAL SPACE W/IMG GUIDE  10/10/2020   NODE DISSECTION Right 08/26/2020   Procedure: NODE DISSECTION;  Surgeon: [name], MD;  Location: [facility];  Service: Thoracic;  Laterality: Right;   THORACENTESIS Right 11/04/2020   Procedure: [facility] Robinsons;  Surgeon: [name], DO;  Location: [facility];  Service: Pulmonary;  Laterality: Right;   THORACENTESIS Right 11/29/2020   Procedure: THORACENTESIS;  Surgeon: [name], DO;  Location: [facility];  Service: Pulmonary;  Laterality: Right;   VIDEO BRONCHOSCOPY WITH ENDOBRONCHIAL NAVIGATION N/A 07/18/2020   Procedure: VIDEO BRONCHOSCOPY WITH ENDOBRONCHIAL NAVIGATION;  Surgeon: [name], DO;  Location: [facility];  Service: Pulmonary;  Laterality: N/A;   VIDEO BRONCHOSCOPY WITH ENDOBRONCHIAL ULTRASOUND  07/18/2020   Procedure: VIDEO BRONCHOSCOPY WITH  ENDOBRONCHIAL ULTRASOUND;  Surgeon: [name], DO;  Location: [facility];  Service: Pulmonary;;    There were no vitals filed for this visit.   Subjective Assessment - 08/05/21 0936     Subjective  Pt reports side in her right side the last 2 weeks-- has appt with upcoming with oncologist.  Pt reports that she is not bumping into things like she was.  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    PMH  includes: lung cancer s/p R upper lobectomy, currently undergoing chemo, GERD, HLD, bradycardia    Patient Stated Goals be indendepent and return to driving    Currently in Pain? Yes    Pain Score 6     Pain Location Rib cage    Pain Orientation Right    Pain Descriptors / Indicators Tender    Pain Type Acute pain    Pain Onset 1 to 4 weeks ago    Pain Frequency Constant    Aggravating Factors  unsure    Pain Relieving Factors stretching             Environmental scanning 11/15 in min distracting environment (hallway) with remaining found on 2nd pass with 1 v.c. for 1 item.  Pt had to pause with conversation.  Then in busier/dynamic environment (therapy gym) with 14/15 found on first pass and remaining found on 2nd pass.    Divided attention:  Ambulating while navigating dynamic therapy gym while holding plate and cup (one in each hand) and performing category generation with min difficulty with generation, 1 prompt to not to pause while thinking.  Noted that pt went to R side to avoid obstacles in areas where there was more space to the L x2 causing her to get too close to object on L x1.  No LOB.  Constant Therapy Alternating Symbol Match for alternating attention and visual scanning with 88% accuracy  (level 6, with 101.07sec average response time)           OT Short Term Goals - 08/05/21 0952       OT SHORT TERM GOAL #1   Title Pt will verbalize understanding of visual compensation strategies for ADLs/IADLs for incr ease/safety.--check STGs 06/28/21    Time 4    Period Weeks    Status Achieved   07/01/21:  needs reinforcement/cues     OT SHORT TERM GOAL #2   Title Pt will perform simple environmental scanning/navigation with at least 90% accuracy and no LOB.    Time 4    Period Weeks    Status Partially Met   07/01/21:  87%, pt reports that she continues to bump into objects on the L at home.  07/10/21:  75% in busy environment.  08/05/21:  73%-93% (inconsistent) but  no LOB or bumping into things     OT SHORT TERM GOAL #3   Title Pt will perform divided attention between at least 1 cognitive and 1 physical task with at least 75% accuracy.    Time 4    Period Weeks    Status Achieved   07/01/21:  not met.  07/10/21: alternating attention at approx 75% accuracy.  08/05/21: met     OT SHORT TERM GOAL #4   Title Pt will be independent with LUE HEP for mild coordination/strength deficits.    Period Weeks    Status On-going   07/01/21:  needs review.  07/10/21:  met  with hand HEP, but  could benefit from HEP for L shoulder              OT Long Term Goals - 08/05/21 1552       OT LONG TERM GOAL #1   Title Pt will verbalize understanding of memory/cognitive compensation strategies for ADLs/IADLs for incr ease/safety.--check LTGs 07/28/21    Time 8    Period Weeks    Status On-going   07/10/21:  needs continued education and reinforcement     OT LONG TERM GOAL #2   Title Pt will perform environmental scanning and navigation in busy, dynamic environment with at least 95% accuracy.    Time 8    Period Weeks    Status On-going   07/10/21:  75%.  08/05/21:  93% today     OT LONG TERM GOAL #3   Title Pt will perform divided attention between at least 1 cognitive and 1 physical task with at least 90% accuracy.    Time 8    Period Weeks    Status On-going      OT LONG TERM GOAL #4   Title Pt will be independent with HEP for visual scanning/cognition.    Time 8    Period Weeks    Status Achieved                   Plan - 08/05/21 7282     Clinical Impression Statement Pt continues to demo difficulty with divided attention and visual scanning, but reports that she is bumping into items less at home.    OT Occupational Profile and History Detailed Assessment- Review of Records and additional review of physical, cognitive, psychosocial history related to current functional performance    Occupational performance deficits (Please refer to  evaluation for details): ADL's;IADL's;Leisure    Body Structure / Function / Physical Skills ADL;UE functional use;IADL;Balance;Dexterity;Coordination;FMC;Strength    Cognitive Skills Memory;Attention;Problem Solve;Safety Awareness;Sequencing    Rehab Potential Good    Clinical Decision Making Several treatment options, min-mod task modification necessary    Comorbidities Affecting Occupational Performance: May have comorbidities impacting occupational performance    Modification or Assistance to Complete Evaluation  Min-Moderate modification of tasks or assist with assess necessary to complete eval    OT Frequency 2x / week    OT Duration 8 weeks   +eval   OT Treatment/Interventions Self-care/ADL training;DME and/or AE instruction;Balance training;Therapeutic activities;Cognitive remediation/compensation;Therapeutic exercise;Neuromuscular education;Functional Mobility Training;Visual/perceptual remediation/compensation;Patient/family education    Plan continue environmental scanning, review strategies for cognition/vision prn, check remaining goals and anticipate d/c    Consulted and Agree with Plan of Care Patient             Patient will benefit from skilled therapeutic intervention in order to improve the following deficits and impairments:   Body Structure / Function / Physical Skills: ADL, UE functional use, IADL, Balance, Dexterity, Coordination, FMC, Strength Cognitive Skills: Memory, Attention, Problem Solve, Safety Awareness, Sequencing     Visit Diagnosis: Attention and concentration deficit  Visuospatial deficit  Frontal lobe and executive function deficit  Other lack of coordination  Other symptoms and signs 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  Unsteadiness on feet  Hemiplegia and hemiparesis following nontraumatic intracerebral hemorrhage affecting left non-dominant side [facility])    Problem List Patient Active Problem  List   Diagnosis Date Noted   Swelling of both lower extremities 05/08/2021   Frequent headaches 03/25/2021   Sciatica of left side 03/25/2021   Chronic  low back pain 03/25/2021   Anxiety disorder 03/25/2021   Intraparenchymal hematoma of brain 02/27/2021   Stroke, 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[place]) 02/23/2021   Intracranial hemorrhage ([place]) 02/22/2021   Chemotherapy induced neutropenia ([place]) 01/08/2021   Bradycardia 01/01/2021   Hypomagnesemia 12/10/2020   Nausea without vomiting 12/10/2020   Status post thoracentesis    Pleural effusion, right 11/01/2020   Adenocarcinoma of right lung, stage 2 ([place]) 09/11/2020   Encounter for antineoplastic chemotherapy 09/11/2020   S/P Robotic Assited Video Thoracoscopy with Right Upper Lobectomy Lung 08/26/2020   Mediastinal adenopathy 07/18/2020   Lung nodules    Lung nodule 06/14/2020   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   Tobacco dependence 05/29/2020   Exertional chest pain 05/28/2020   Numbness and tingling of left arm and leg 07/01/2012   Hyperlipidemia with target low density lipoprotein (LDL) cholesterol less than 100 mg/dL 07/01/2012   Chest pressure 06/30/2012   GERD (gastroesophageal reflux disease) 06/30/2012    Aadvik Roker, OT/L 08/05/2021, 4:01 PM  [facility] [address] [place], [place], [address] Phone: [phone]   Fax:  [phone]  Name: Tonya [name] MRN: [id number] Date of Birth: 1945-12-06  Vianne Bulls, OTR/L [facility] [address] [place], [place]  [address] [phone] phone [phone] 08/05/21 4:01 PM

## 2021-08-06 ENCOUNTER — Encounter: Payer: Self-pay | Admitting: Physical Therapy

## 2021-08-06 ENCOUNTER — Ambulatory Visit: Payer: Medicare HMO

## 2021-08-06 ENCOUNTER — Ambulatory Visit: Payer: Medicare HMO | Admitting: Physical Therapy

## 2021-08-06 ENCOUNTER — Ambulatory Visit: Payer: Medicare HMO | Admitting: Occupational Therapy

## 2021-08-06 DIAGNOSIS — R2681 Unsteadiness on feet: Secondary | ICD-10-CM | POA: Diagnosis not present

## 2021-08-06 DIAGNOSIS — R41841 Cognitive communication deficit: Secondary | ICD-10-CM

## 2021-08-06 DIAGNOSIS — R41842 Visuospatial deficit: Secondary | ICD-10-CM

## 2021-08-06 DIAGNOSIS — I69154 Hemiplegia and hemiparesis following nontraumatic intracerebral hemorrhage affecting left non-dominant side: Secondary | ICD-10-CM

## 2021-08-06 DIAGNOSIS — R278 Other lack of coordination: Secondary | ICD-10-CM

## 2021-08-06 DIAGNOSIS — R2689 Other abnormalities of gait and mobility: Secondary | ICD-10-CM

## 2021-08-06 DIAGNOSIS — M6281 Muscle weakness (generalized): Secondary | ICD-10-CM

## 2021-08-06 DIAGNOSIS — R41844 Frontal lobe and executive function deficit: Secondary | ICD-10-CM

## 2021-08-06 DIAGNOSIS — R41840 Attention and concentration deficit: Secondary | ICD-10-CM

## 2021-08-06 DIAGNOSIS — I69118 Other symptoms and signs 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: Secondary | ICD-10-CM

## 2021-08-06 NOTE — Patient Instructions (Signed)
Memory Compensation Strategies  Use "WARM" strategy. W= write it down A=  associate it R=  repeat it M=  make a mental picture  You can keep a Memory Notebook. Use a 3-ring notebook with sections for the following:  calendar, important names and phone numbers, medications, doctors' names/phone numbers, "to do list"/reminders, and a section to journal what you did each day  Use a calendar to write appointments down.  Write yourself a schedule for the day.  This can be placed on the calendar or in a separate section of the Memory Notebook.  Keeping a regular schedule can help memory.  Use medication organizer with sections for each day or morning/evening pills  You may need help loading it  Keep a basket, or pegboard by the door.   Place items that you need to take out with you in the basket or on the pegboard.  You may also want to include a message board for reminders.  Use sticky notes. Place sticky notes with reminders in a place where the task is performed.  For example:  "turn off the stove" placed by the stove, "lock the door" placed on the door at eye level, "take your medications" on the bathroom mirror or by the place where you normally take your medications  Use alarms, timers, and/or a reminder app. Use while cooking to remind yourself to check on food or as a reminder to take your medicine, or as a reminder to make a call, or as a reminder to perform another task, etc.   I would recommend that you hold off on driving for now due to visual and cognitive changes.  As these improve, you can pursue clearance from your MD and drive initially with your husband for safety. Here is information about driving eval programs:  Librarian, academic Programs:  Comprehensive Evaluation: includes clinical and in vehicle behind the wheel testing by OCCUPATIONAL THERAPIST. Programs have varying levels of adaptive controls available for trial.   [employer],  [place] [address] [place], [place]  [address] [phone] or [phone] [email or web address].driver-rehab.com Evaluator:  Richelle Ito, OT/CDRS/CDI/SCDCM/[facility] [address] [place], [place] [address] [phone] [email or web address] Evaluators:  Bertram Savin, OT and Mertie Clause, OT  [facility] [place]) Physical [place] [address] [place], [place]  [address] [phone] [email or web address] Evaluators:  Bernadene Bell, KT; Heron Sabins, KT;  Shirlee Latch, KT (KT=kiniesotherapist)   Clinical evaluations only:  Includes clinical testing, refers to other programs or local certified driving instructor for behind the wheel testing.  [facility] (outpatient Rehab) [place] [address] [place], [place] [address] [phone] for scheduling [email or web address] Evaluators:  Valentino Hue, OT; Haynes Hoehn, OT  Other area clinical evaluators available upon request including [facility], [facility] and [facility].       Resource List What is a Warden/ranger: Your Road Ahead - A Guide to [employer] Evaluations http://www.thehartford.com/resources/mature-market-excellence/publications-on-aging  Association for Musician - Disability and Driving Fact Sheets http://www.aded.net/?page=510  Driving after a Brain Injury: Brain Injury Association of [employer] [place].be?A=SearchResult&SearchID=9495675&ObjectID=2758842&ObjectType=35  Driving with Adaptive  Equipment: Chi[employer] Association DebtRide.com.au

## 2021-08-06 NOTE — Therapy (Signed)
[facility] [address] [place], [place], [address] Phone: [phone]   Fax:  [phone]  Physical Therapy Treatment  Patient Details  Name: Tonya [name] MRN: [id number] Date of Birth: Nov 22, 1945 No data recorded  Encounter Date: 08/06/2021   PT End of Session - 08/06/21 1203     Visit Number 9    Number of Visits 13    Date for PT Re-Evaluation 08/05/21    Authorization Type Recert 16/0/73 to 71/06/26    Authorization Time [place] 8 visits from 11/22 through 09/05/21    Authorization - Visit Number 2    Authorization - Number of Visits 8    Progress Note Due on Visit 10    PT Start Time 0925    PT Stop Time 1010    PT Time Calculation (min) 45 min    Equipment Utilized During Treatment Gait belt    Activity Tolerance Patient tolerated treatment well;No increased pain    Behavior During Therapy WFL for tasks assessed/performed             Past Medical History:  Diagnosis Date   Adenoma of left adrenal gland    Cancer ([place])    Lung   Colon polyps    hyperplastic   Complication of anesthesia    Very emotional and cries after anesthesia   GERD (gastroesophageal reflux disease)    High blood pressure 03/25/2021   Kidney stone    Liver lesion    Microhematuria     Past Surgical History:  Procedure Laterality Date   ABDOMINAL HYSTERECTOMY     APPENDECTOMY     BRONCHIAL BIOPSY  07/18/2020   Procedure: BRONCHIAL BIOPSIES;  Surgeon: [name], DO;  Location: [facility];  Service: Pulmonary;;   BRONCHIAL BRUSHINGS  07/18/2020   Procedure: BRONCHIAL BRUSHINGS;  Surgeon: [name], DO;  Location: [facility];  Service: Pulmonary;;   BRONCHIAL NEEDLE ASPIRATION BIOPSY  07/18/2020   Procedure: BRONCHIAL NEEDLE ASPIRATION BIOPSIES;  Surgeon: [name], DO;  Location: [facility];  Service: Pulmonary;;   BRONCHIAL WASHINGS  07/18/2020   Procedure: BRONCHIAL  WASHINGS;  Surgeon: [name], DO;  Location: [facility];  Service: Pulmonary;;   FIDUCIAL MARKER PLACEMENT  07/18/2020   Procedure: FIDUCIAL MARKER PLACEMENT;  Surgeon: [name], DO;  Location: [facility];  Service: Pulmonary;;   INTERCOSTAL NERVE BLOCK Right 08/26/2020   Procedure: INTERCOSTAL NERVE BLOCK;  Surgeon: [name], MD;  Location: [facility];  Service: Thoracic;  Laterality: Right;   IR THORACENTESIS ASP PLEURAL SPACE W/IMG GUIDE  10/10/2020   NODE DISSECTION Right 08/26/2020   Procedure: NODE DISSECTION;  Surgeon: [name], MD;  Location: [facility];  Service: Thoracic;  Laterality: Right;   THORACENTESIS Right 11/04/2020   Procedure: Mathews Robinsons;  Surgeon: [name], DO;  Location: [facility];  Service: Pulmonary;  Laterality: Right;   THORACENTESIS Right 11/29/2020   Procedure: THORACENTESIS;  Surgeon: [name], DO;  Location: [facility];  Service: Pulmonary;  Laterality: Right;   VIDEO BRONCHOSCOPY WITH ENDOBRONCHIAL NAVIGATION N/A 07/18/2020   Procedure: VIDEO BRONCHOSCOPY WITH ENDOBRONCHIAL NAVIGATION;  Surgeon: [name], DO;  Location: [facility];  Service: Pulmonary;  Laterality: N/A;   VIDEO BRONCHOSCOPY WITH ENDOBRONCHIAL ULTRASOUND  07/18/2020   Procedure: VIDEO BRONCHOSCOPY WITH ENDOBRONCHIAL ULTRASOUND;  Surgeon: [name], DO;  Location: [facility];  Service: Pulmonary;;    There were no vitals filed for this visit.  Subjective Assessment - 08/06/21 0928     Subjective No new complaints or falls. Pt is "a little sad about graduating. My side is still hurting but I've got an appointment with my MD tomorrow."    Patient is accompained by: Family member    Pertinent History HTN, lung cancer, GERD, HLD    Limitations Walking;House hold activities;Standing    How long can you stand comfortably? >20 mins    How long can you walk comfortably? 30 mins    Patient Stated Goals "I want to improve my  balance"    Currently in Pain? Yes    Pain Score 5     Pain Location Rib cage    Pain Orientation Right    Pain Descriptors / Indicators Tender    Pain Type Acute pain    Pain Onset 1 to 4 weeks ago               [facility] Adult PT Treatment/Exercise - 08/06/21 0931       Transfers   Transfers Sit to Stand;Stand to Sit    Sit to Stand 6: Modified independent (Device/Increase time)    Five time sit to stand comments  11.97   11.97 sec with no UE support from standard chair   Stand to Sit 6: Modified independent (Device/Increase time)      Ambulation/Gait   Ambulation/Gait Yes    Ambulation/Gait Assistance 5: Supervision    Ambulation Distance (Feet) --   4 laps around track   Assistive device None    Gait Pattern Step-through pattern;Decreased stride length;Left foot flat;Trunk flexed;Narrow base of support;Poor foot clearance - left;Poor foot clearance - right    Ambulation Surface Level;Indoor    Gait velocity 3.0 ft/s               Balance Exercises - 08/06/21 1011       Balance Exercises: Standing   Tandem Stance Foam/compliant surface;Eyes closed;Intermittent upper extremity support;3 reps;30 secs;Limitations    Tandem Stance Time Pt performed tandem stance on airex pad with intermittent UE support. 3 x 30 sec each side.    Step Over Hurdles / Cones Pt performed forward and lateral stepping over hurdles on blue mat x 3 down and back. The pt demonstrated decreased foot clearance on the L when stepping forward over hurdles. The pt required supervision/min guard and demonstrated no LOB.               PT Short Term Goals - 06/26/21 1653       PT SHORT TERM GOAL #1   Title Pt will be IND with initial HEP in order to indicate improved functional mobility and dec fall risk.  (Target Date:06/19/21)    Baseline 06/26/21: HEP just issued this session    Status Not Met      PT SHORT TERM GOAL #2   Title Pt will improve gait speed to >/=2.65 ft/sec in order to  indicate safe community ambulation.    Baseline 06/26/21: 2.55 ft/sec no AD, improved just not to goal level    Status Partially Met      PT SHORT TERM GOAL #3   Title Pt will improve 5TSS to </=14 secs without UE support in order to ind dec fall risk and improved functional strength.    Baseline 06/26/21: 13.94 sec's no UE support from standard height surface    Status Achieved      PT SHORT TERM GOAL #4   Title Pt will improve  FGA to >/=20/30  in order to indicate dec fall risk.    Baseline 06/26/21: 17/30 scored today, improved by 1 point from last assessment    Status Not Met      PT SHORT TERM GOAL #5   Title Will assess cognitive TUG and write appropriate LTG to reflect dec fall risk    Baseline 06/26/21: 13.82 sec's no AD while counting backwards from 3. PT to set goals.    Time --    Period --    Status Achieved              PT Long Term Goals - 08/06/21 1204       PT LONG TERM GOAL #1   Title Pt will be IND with final HEP in order to indicate dec fall risk and improved functional mobility.  (Target Date: 08/05/21)    Baseline 10/06/20: met with current program    Status Achieved      PT LONG TERM GOAL #2   Title Pt will improve gait speed to >/=3.25 ft/sec in order to indicate dec fall risk and improved efficiency of gait.    Baseline 3.0 ft/s (08/06/21), was 3.81 ft/sec on 07/08/21    Time --    Status Not Met      PT LONG TERM GOAL #3   Title Pt will perform 5TSS in </=11.5 secs without UE support in order to ind dec fall risk and improved functional strength.    Baseline 11.97 sec (08/06/21), 12.72 sec's on 07/08/21 so improved just not to goal    Time --    Period --    Status Partially Met      PT LONG TERM GOAL #4   Title Pt will improve FGA to >/=24/30 in order to indicate dec fall risk.    Baseline 24/30 (07/08/21)    Time --    Period --    Status Achieved      PT LONG TERM GOAL #5   Title Pt will ambulate >1000' over varying outdoor surfaces at  mod I level demonstrating ability to scan L environment/avoid obstacles on L in order to indicate safe community mobility.    Baseline Deferred due to weather 08/06/2021, pt does demo improved scanning indoors with no bumping into obstacles on left side.    Time --    Period --    Status Deferred                  Plan - 08/06/21 1204     Clinical Impression Statement Today's skilled session was focused on assessing LTGs for expected D/C. The pt has met 2/5 goals, not met 1 goal, and partially met 1 goal. ! goal for ambulation outdoors was deferred due to weather. The pt was informed of PT's intent to discharge today, that note would be sent for primary PT to review as pt wishes to continue to work on balance.    Personal Factors and Comorbidities Comorbidity 3+    Comorbidities see above    Examination-Activity Limitations Locomotion Level;Squat;Stairs;Stand;Transfers    Examination-Participation Restrictions Community Activity;Driving;Yard Work    [employer] Evolving/Moderate complexity    Rehab Potential Good    PT Frequency 2x / week    PT Duration 4 weeks    PT Treatment/Interventions ADLs/Self Care Home Management;Aquatic Therapy;Gait training;Stair training;Functional mobility training;Therapeutic activities;Therapeutic exercise;Balance training;Neuromuscular re-education;Patient/family education;Passive range of motion;Vestibular;Visual/perceptual remediation/compensation;Manual techniques;Joint Manipulations;Orthotic Fit/Training;Moist Heat;Cryotherapy;Energy conservation;Spinal Manipulations    PT Next Visit Plan Send  note to PT for review for discharge as intended vs recert due to not all goals met.    Consulted and Agree with Plan of Care Patient             Patient will benefit from skilled therapeutic intervention in order to improve the following deficits and impairments:  Abnormal gait, Decreased activity tolerance, Decreased balance,  Decreased cognition, Decreased endurance, Decreased mobility, Decreased strength, Impaired perceived functional ability, Impaired sensation, Postural dysfunction  Visit Diagnosis: Unsteadiness on feet  Other abnormalities of gait and mobility  Muscle weakness (generalized)     Problem List Patient Active Problem List   Diagnosis Date Noted   Swelling of both lower extremities 05/08/2021   Frequent headaches 03/25/2021   Sciatica of left side 03/25/2021   Chronic low back pain 03/25/2021   Anxiety disorder 03/25/2021   Intraparenchymal hematoma of brain 02/27/2021   Stroke, 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   Intracranial hemorrhage ([facility]) 02/22/2021   Chemotherapy induced neutropenia ([facility]) 01/08/2021   Bradycardia 01/01/2021   Hypomagnesemia 12/10/2020   Nausea without vomiting 12/10/2020   Status post thoracentesis    Pleural effusion, right 11/01/2020   Adenocarcinoma of right lung, stage 2 ([facility]) 09/11/2020   Encounter for antineoplastic chemotherapy 09/11/2020   S/P Robotic Assited Video Thoracoscopy with Right Upper Lobectomy Lung 08/26/2020   Mediastinal adenopathy 07/18/2020   Lung nodules    Lung nodule 06/14/2020   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   Tobacco dependence 05/29/2020   Exertional chest pain 05/28/2020   Numbness and tingling of left arm and leg 07/01/2012   Hyperlipidemia with target low density lipoprotein (LDL) cholesterol less than 100 mg/dL 07/01/2012   Chest pressure 06/30/2012   GERD (gastroesophageal reflux disease) 06/30/2012    Rondel Baton, SPTA 08/06/2021, 12:13 PM  [facility] [address] [place], [place], [address] Phone: [phone]   Fax:  [phone]  Name: Tonya [name] MRN: [id number] Date of Birth: 1946-02-03  This note has been reviewed and edited by supervising CI.   Willow Ora, PTA, [facility] [address], [place], [place] [address] [phone] 08/06/21, 1:49 PM

## 2021-08-06 NOTE — Therapy (Signed)
[facility] [address] [place], [place], [address] Phone: [phone]   Fax:  [phone]  Occupational Therapy Treatment  Patient Details  Name: Tonya [name] MRN: [id number] Date of Birth: 29-Sep-1945 Referring Provider (OT): Dr. Alysia Penna   Encounter Date: 08/06/2021   OT End of Session - 08/06/21 1706     Visit Number 14    Number of Visits 17   10+12=22   Date for OT Re-Evaluation 08/24/21    Authorization Type [employer]; Out of pocket has been met in full, patient is covered 100%.  Approved 16 visits 05/29/21-07/28/21    Authorization - Visit Number 15    Authorization - Number of Visits 20    Progress Note Due on Visit [place] completed but NP (score of functional intake measure of 91 for upper extremity; however, primary deficits are cognition, visual-perceptual, and balance)    Activity Tolerance Patient tolerated treatment well    Behavior During Therapy WFL for tasks assessed/performed             Past Medical History:  Diagnosis Date   Adenoma of left adrenal gland    Cancer ([facility])    Lung   Colon polyps    hyperplastic   Complication of anesthesia    Very emotional and cries after anesthesia   GERD (gastroesophageal reflux disease)    High blood pressure 03/25/2021   Kidney stone    Liver lesion    Microhematuria     Past Surgical History:  Procedure Laterality Date   ABDOMINAL HYSTERECTOMY     APPENDECTOMY     BRONCHIAL BIOPSY  07/18/2020   Procedure: BRONCHIAL BIOPSIES;  Surgeon: [name], DO;  Location: [facility];  Service: Pulmonary;;   BRONCHIAL BRUSHINGS  07/18/2020   Procedure: BRONCHIAL BRUSHINGS;  Surgeon: [name], DO;  Location: [place];  Service: Pulmonary;;   BRONCHIAL NEEDLE ASPIRATION BIOPSY  07/18/2020   Procedure: BRONCHIAL NEEDLE ASPIRATION BIOPSIES;  Surgeon: [name], DO;   Location: [facility];  Service: Pulmonary;;   BRONCHIAL WASHINGS  07/18/2020   Procedure: BRONCHIAL WASHINGS;  Surgeon: [name], DO;  Location: [facility];  Service: Pulmonary;;   FIDUCIAL MARKER PLACEMENT  07/18/2020   Procedure: FIDUCIAL MARKER PLACEMENT;  Surgeon: [name], DO;  Location: [facility];  Service: Pulmonary;;   INTERCOSTAL NERVE BLOCK Right 08/26/2020   Procedure: INTERCOSTAL NERVE BLOCK;  Surgeon: [name], MD;  Location: [facility];  Service: Thoracic;  Laterality: Right;   IR THORACENTESIS ASP PLEURAL SPACE W/IMG GUIDE  10/10/2020   NODE DISSECTION Right 08/26/2020   Procedure: NODE DISSECTION;  Surgeon: [name], MD;  Location: [facility];  Service: Thoracic;  Laterality: Right;   THORACENTESIS Right 11/04/2020   Procedure: Mathews Robinsons;  Surgeon: [name], DO;  Location: [facility];  Service: Pulmonary;  Laterality: Right;   THORACENTESIS Right 11/29/2020   Procedure: THORACENTESIS;  Surgeon: [name], DO;  Location[facility];  Service: Pulmonary;  Laterality: Right;   VIDEO BRONCHOSCOPY WITH ENDOBRONCHIAL NAVIGATION N/A 07/18/2020   Procedure: VIDEO BRONCHOSCOPY WITH ENDOBRONCHIAL NAVIGATION;  Surgeon: [name], DO;  Location: [facility];  Service: Pulmonary;  Laterality: N/A;   VIDEO BRONCHOSCOPY WITH ENDOBRONCHIAL ULTRASOUND  07/18/2020   Procedure: VIDEO BRONCHOSCOPY WITH ENDOBRONCHIAL ULTRASOUND;  Surgeon: [name], DO;  Location: [facility];  Service: Pulmonary;;    There were no vitals filed  for this visit.   Subjective Assessment - 08/06/21 1701     Subjective  Pt agrees with plans for d/c    Pertinent History R parietal lobe ICH and R frontal SAH.    PMH includes: lung cancer s/p R upper lobectomy, currently undergoing chemo, GERD, HLD, bradycardia    Patient Stated Goals be indendepent and return to driving    Currently in Pain? Yes    Pain Score 5     Pain Location Rib cage     Pain Orientation Right    Pain Descriptors / Indicators Tender    Pain Type Acute pain    Pain Onset 1 to 4 weeks ago    Pain Frequency Constant    Aggravating Factors  unsure    Pain Relieving Factors stretching                     Therapist reviewed pt's yellow theraband HEP and reviewed memory strategies and recommendation that pt does not drive at this time. Pt was provided with driver eval information.               OT Short Term Goals - 08/06/21 0907       OT SHORT TERM GOAL #1   Title Pt will verbalize understanding of visual compensation strategies for ADLs/IADLs for incr ease/safety.--check STGs 06/28/21    Time 4    Period Weeks    Status Achieved   07/01/21:  needs reinforcement/cues     OT SHORT TERM GOAL #2   Title Pt will perform simple environmental scanning/navigation with at least 90% accuracy and no LOB.    Time 4    Period Weeks    Status Partially Met   07/01/21:  87%, pt reports that she continues to bump into objects on the L at home.  07/10/21:  75% in busy environment.  08/05/21:  73%-93% (inconsistent) but no LOB or bumping into things     OT SHORT TERM GOAL #3   Title Pt will perform divided attention between at least 1 cognitive and 1 physical task with at least 75% accuracy.    Time 4    Period Weeks    Status Achieved   07/01/21:  not met.  07/10/21: alternating attention at approx 75% accuracy.  08/05/21: met     OT SHORT TERM GOAL #4   Title Pt will be independent with LUE HEP for mild coordination/strength deficits.    Period Weeks    Status Achieved   coordination and theraband HEP              OT Long Term Goals - 08/06/21 0854       OT LONG TERM GOAL #1   Title Pt will verbalize understanding of memory/cognitive compensation strategies for ADLs/IADLs for incr ease/safety.--check LTGs 07/28/21    Status Achieved   met following review     OT LONG TERM GOAL #2   Title Pt will perform environmental scanning  and navigation in busy, dynamic environment with at least 95% accuracy.    Status Not Met   93%     OT LONG TERM GOAL #3   Title Pt will perform divided attention between at least 1 cognitive and 1 physical task with at least 90% accuracy.    Status Not Met   not met, not consistent     OT LONG TERM GOAL #4   Title Pt will be independent with HEP for visual scanning/cognition.  Status Achieved                   Plan - 08/06/21 1702     Clinical Impression Statement Pt has made good overall progress, however due to visual and cognitve deficits pt did not fully achieve all goals.    OT Occupational Profile and History Detailed Assessment- Review of Records and additional review of physical, cognitive, psychosocial history related to current functional performance    Occupational performance deficits (Please refer to evaluation for details): ADL's;IADL's;Leisure    Body Structure / Function / Physical Skills ADL;UE functional use;IADL;Balance;Dexterity;Coordination;FMC;Strength    Cognitive Skills Memory;Attention;Problem Solve;Safety Awareness;Sequencing    Rehab Potential Good    Clinical Decision Making Several treatment options, min-mod task modification necessary    Comorbidities Affecting Occupational Performance: May have comorbidities impacting occupational performance    Modification or Assistance to Complete Evaluation  Min-Moderate modification of tasks or assist with assess necessary to complete eval    OT Frequency 2x / week    OT Duration 8 weeks   +eval   OT Treatment/Interventions Self-care/ADL training;DME and/or AE instruction;Balance training;Therapeutic activities;Cognitive remediation/compensation;Therapeutic exercise;Neuromuscular education;Functional Mobility Training;Visual/perceptual remediation/compensation;Patient/family education    Plan d/c OT    Consulted and Agree with Plan of Care Patient             Patient will benefit from skilled  therapeutic intervention in order to improve the following deficits and impairments:   Body Structure / Function / Physical Skills: ADL, UE functional use, IADL, Balance, Dexterity, Coordination, FMC, Strength Cognitive Skills: Memory, Attention, Problem Solve, Safety Awareness, Sequencing     Visit Diagnosis: Attention and concentration deficit  Visuospatial deficit  Frontal lobe and executive function deficit  Other lack of coordination  Other symptoms and signs 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  Unsteadiness on feet  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)  OCCUPATIONAL THERAPY DISCHARGE SUMMARY    Current functional level related to goals / functional outcomes: Pt made progress towards goals however she did not fully achieve goals due to visual and cognitive deficits.   Remaining deficits: Cognitive deficits, visual deficits   Education / Equipment: Pt was educated regarding HEP, memory strategies and provided with driving eval information. Therapist recommends pt does not drive at this time due to visual and cognitive deficits.  Patient agrees to discharge. Patient goals were partially met. Patient is being discharged due to being pleased with the current functional level..     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   Intracranial hemorrhage ([facility]) 02/22/2021   Chemotherapy induced neutropenia ([facility]) 01/08/2021   Bradycardia 01/01/2021   Hypomagnesemia 12/10/2020   Nausea without vomiting 12/10/2020   Status post thoracentesis    Pleural effusion, right  11/01/2020   Adenocarcinoma of right lung, stage 2 ([facility]) 09/11/2020   Encounter for antineoplastic chemotherapy 09/11/2020   S/P Robotic Assited Video Thoracoscopy with Right Upper Lobectomy Lung 08/26/2020   Mediastinal adenopathy 07/18/2020   Lung nodules    Lung nodule 06/14/2020   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   Tobacco dependence 05/29/2020   Exertional chest pain 05/28/2020   Numbness and tingling of left arm and leg 07/01/2012  Hyperlipidemia with target low density lipoprotein (LDL) cholesterol less than 100 mg/dL 07/01/2012   Chest pressure 06/30/2012   GERD (gastroesophageal reflux disease) 06/30/2012    Servando Kyllonen, OT/L 08/06/2021, 5:06 PM Theone Murdoch, OTR/L [phone] Phone: [phone] 5:08 PM 08/06/21  [facility] [address] [place], [place], [address] Phone: [phone]   Fax:  [phone]  Name: Tonya [name] MRN: [id number] Date of Birth: 1945-12-07

## 2021-08-06 NOTE — Therapy (Signed)
[facility] [address] [place], [place], [address] Phone: [phone]   Fax:  [phone]  Speech Language Pathology Treatment/Recert  Patient Details  Name: Tonya [name] MRN: [id number] Date of Birth: 1946-08-27 Referring Provider (SLP): Erick Colace, MD   Encounter Date: 08/06/2021   End of Session - 08/06/21 1055     Visit Number 11    Number of Visits 17    Date for SLP Re-Evaluation 09/05/21   recert   Authorization Type [employer]    Authorization Time Period 6 SLP visits 07/29/2021 - 09/06/2021    Authorization - Visit Number 2    Authorization - Number of Visits 6    SLP Start Time 1100    SLP Stop Time  1145    SLP Time Calculation (min) 45 min    Activity Tolerance Patient tolerated treatment well             Past Medical History:  Diagnosis Date   Adenoma of left adrenal gland    Cancer (HCC)    Lung   Colon polyps    hyperplastic   Complication of anesthesia    Very emotional and cries after anesthesia   GERD (gastroesophageal reflux disease)    High blood pressure 03/25/2021   Kidney stone    Liver lesion    Microhematuria     Past Surgical History:  Procedure Laterality Date   ABDOMINAL HYSTERECTOMY     APPENDECTOMY     BRONCHIAL BIOPSY  07/18/2020   Procedure: BRONCHIAL BIOPSIES;  Surgeon: Josephine Igo, DO;  [facility];  Service: Pulmonary;;   BRONCHIAL BRUSHINGS  07/18/2020   Procedure: BRONCHIAL BRUSHINGS;  Surgeon: Josephine Igo, DO;  [facility];  Service: Pulmonary;;   BRONCHIAL NEEDLE ASPIRATION BIOPSY  07/18/2020   Procedure: BRONCHIAL NEEDLE ASPIRATION BIOPSIES;  Surgeon: Josephine Igo, DO;  [facility];  Service: Pulmonary;;   BRONCHIAL WASHINGS  07/18/2020   Procedure: BRONCHIAL WASHINGS;  Surgeon: Josephine Igo, DO;  [facility];  Service: Pulmonary;;   FIDUCIAL MARKER PLACEMENT   07/18/2020   Procedure: FIDUCIAL MARKER PLACEMENT;  Surgeon: Josephine Igo, DO;  [facility];  Service: Pulmonary;;   INTERCOSTAL NERVE BLOCK Right 08/26/2020   Procedure: INTERCOSTAL NERVE BLOCK;  Surgeon: Corliss Skains, MD;  [facility] OR;  Service: Thoracic;  Laterality: Right;   IR THORACENTESIS ASP PLEURAL SPACE W/IMG GUIDE  10/10/2020   NODE DISSECTION Right 08/26/2020   Procedure: NODE DISSECTION;  Surgeon: Corliss Skains, MD;  [facility];  Service: Thoracic;  Laterality: Right;   THORACENTESIS Right 11/04/2020   Procedure: Alanson Puls;  Surgeon: Josephine Igo, DO;  [facility];  Service: Pulmonary;  Laterality: Right;   THORACENTESIS Right 11/29/2020   Procedure: THORACENTESIS;  Surgeon: Josephine Igo, DO;  [facility]COPY;  Service: Pulmonary;  Laterality: Right;   VIDEO BRONCHOSCOPY WITH ENDOBRONCHIAL NAVIGATION N/A 07/18/2020   Procedure: VIDEO BRONCHOSCOPY WITH ENDOBRONCHIAL NAVIGATION;  Surgeon: Josephine Igo, DO;  [facility];  Service: Pulmonary;  Laterality: N/A;   VIDEO BRONCHOSCOPY WITH ENDOBRONCHIAL ULTRASOUND  07/18/2020   Procedure: VIDEO BRONCHOSCOPY WITH ENDOBRONCHIAL ULTRASOUND;  Surgeon: Josephine Igo, DO;  [facility];  Service: Pulmonary;;    There were no vitals filed for this visit.   Subjective Assessment - 08/06/21 1100     Subjective "I always forget my purse"    Currently in Pain? Yes  Pain Score 5     Pain Location Rib cage                   ADULT SLP TREATMENT - 08/06/21 1056       General Information   Behavior/Cognition Alert;Cooperative;Pleasant mood      Treatment Provided   Treatment provided Cognitive-Linquistic      Cognitive-Linquistic Treatment   Treatment focused on Cognition;Patient/family/caregiver education    Skilled Treatment Pt nearly forget purse in waiting room but did recognize after short delay. Pt stated "I think it's doing  better" in discussion of current cognition as pt is feeling "more normal." Pt is now using phone to manage appointments. No episodes of missed bills endorsed. Pt reported episode of reduced error awareness while cooking. SLP provided assistance with deductive reasoning homework, specifically to assist with thought organization. Usual min A required to complete task.      Assessment / Recommendations / Plan   Plan Continue with current plan of care;Goals updated      Progression Toward Goals   Progression toward goals Progressing toward goals              SLP Education - 08/06/21 1439     Education Details attention to detail    Person(s) Educated Patient    Methods Explanation;Demonstration;Verbal cues    Comprehension Verbalized understanding;Returned demonstration;Need further instruction              SLP Short Term Goals - 07/15/21 1124       SLP SHORT TERM GOAL #1   Title Pt will use memory compensations for appointments, finances, and other daily activities with rare min A  over 2 sessions    Baseline 06-23-21, 07-01-21    Status Achieved    Target Date 07/15/21   extended as pt started tx two weeks after eval     SLP SHORT TERM GOAL #2   Title Pt will use verbalize and demo accurate problem solving for functional scenarios encountered at home with rare min A over 2 sessions    Baseline 07-01-21, 07-04-21    Status Achieved    Target Date 07/15/21      SLP SHORT TERM GOAL #3   Title Pt will ID and correct errors on structured cognitive tasks with 80% accuracy given rare min A over 2 sessions    Baseline 07-08-21    Status Partially Met    Target Date 07/15/21      SLP SHORT TERM GOAL #4   Title Pt will implement strategy to ensure stove is turned off after cooking for 3/3 opportunities    Baseline 06-23-21, 07-01-21, 07-04-21    Status Achieved    Target Date 07/15/21      SLP SHORT TERM GOAL #5   Title Pt will complete cognitive PROM in first few sessions     Status Achieved    Target Date 07/15/21              SLP Long Term Goals - 08/06/21 1059       SLP LONG TERM GOAL #1   Title Pt will independently manage appointments and bill pay with use of compensations with no errors reported for 3/3 opportunities    Status Achieved      SLP LONG TERM GOAL #2   Title Pt will use independently demo accurate problem solving for functional scenarios encountered at home over 2 sessions    Baseline 08-06-21    Status Partially Met  SLP LONG TERM GOAL #3   Title Pt will report improved cognitive linguistic functioning on PROM by 5 points at last ST session    Time 4    Period Weeks    Status On-going   ongoing for recert   Target Date 01/74/94      SLP LONG TERM GOAL #4   Title Pt will independently identify any errors on structured or unstructured cognitive tasks to be 90% accurate given rare min A over 2 sessions    Time 4    Period Weeks    Status New    Target Date 09/05/21              Plan - 08/06/21 1055     Clinical Impression Statement "Sincerity" was referred for OPST to address cognition secondary to 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 Overall improving cognition reported by patient. Good carryover of SLP recommendations and strategies indicated. Given pt has not returned to baseline, POC recert for 2x/week for 4 more weeks. SLP recommends skilled ST intervention to address cognitive linguistic skills to maximize return to PLOF and increase functional independence.    Speech Therapy Frequency 2x / week    Duration 8 weeks    Treatment/Interventions Patient/family education;Functional tasks;Cognitive reorganization;Compensatory techniques;Internal/external aids;SLP instruction and feedback;Environmental controls;Cueing hierarchy;Compensatory strategies    Potential to Achieve Goals Good    SLP Home Exercise Plan provided    Consulted and Agree with Plan of Care Patient;Family member/caregiver             Patient  will benefit from skilled therapeutic intervention in order to improve the following deficits and impairments:   Cognitive communication deficit    Problem List Patient Active Problem List   Diagnosis Date Noted   Swelling of both lower extremities 05/08/2021   Frequent headaches 03/25/2021   Sciatica of left side 03/25/2021   Chronic low back pain 03/25/2021   Anxiety disorder 03/25/2021   Intraparenchymal hematoma of brain 02/27/2021   Stroke, 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   Intracranial hemorrhage ([place]) 02/22/2021   Chemotherapy induced neutropenia ([place]) 01/08/2021   Bradycardia 01/01/2021   Hypomagnesemia 12/10/2020   Nausea without vomiting 12/10/2020   Status post thoracentesis    Pleural effusion, right 11/01/2020   Adenocarcinoma of right lung, stage 2 ([place]) 09/11/2020   Encounter for antineoplastic chemotherapy 09/11/2020   S/P Robotic Assited Video Thoracoscopy with Right Upper Lobectomy Lung 08/26/2020   Mediastinal adenopathy 07/18/2020   Lung nodules    Lung nodule 06/14/2020   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   Tobacco dependence 05/29/2020   Exertional chest pain 05/28/2020   Numbness and tingling of left arm and leg 07/01/2012   Hyperlipidemia with target low density lipoprotein (LDL) cholesterol less than 100 mg/dL 07/01/2012   Chest pressure 06/30/2012   GERD (gastroesophageal reflux disease) 06/30/2012    Alinda Deem, CCC-SLP 08/06/2021, 2:41 PM  [facility] [address] [place], [place], [address] Phone: [phone]   Fax:  [phone]   Name: MARLINA CATALDI MRN: [id number] Date of Birth: [birth year]

## 2021-08-07 ENCOUNTER — Other Ambulatory Visit: Payer: Self-pay

## 2021-08-07 ENCOUNTER — Encounter: Payer: Self-pay | Admitting: Physical Medicine & Rehabilitation

## 2021-08-07 ENCOUNTER — Encounter: Payer: Medicare HMO | Attending: Physical Medicine & Rehabilitation | Admitting: Physical Medicine & Rehabilitation

## 2021-08-07 ENCOUNTER — Inpatient Hospital Stay: Payer: Medicare HMO | Attending: Internal Medicine

## 2021-08-07 ENCOUNTER — Ambulatory Visit (HOSPITAL_COMMUNITY)
Admission: RE | Admit: 2021-08-07 | Discharge: 2021-08-07 | Disposition: A | Discharge status: Home / Self Care | Payer: Medicare HMO | Source: Ambulatory Visit | Attending: Physician Assistant | Admitting: Physician Assistant

## 2021-08-07 VITALS — BP 130/75 | HR 55 | Ht 64.0 in | Wt 147.0 lb

## 2021-08-07 DIAGNOSIS — I69319 Unspecified symptoms and signs involving cognitive functions following cerebral infarction: Secondary | ICD-10-CM | POA: Insufficient documentation

## 2021-08-07 DIAGNOSIS — Z85118 Personal history of other malignant neoplasm of bronchus and lung: Secondary | ICD-10-CM | POA: Insufficient documentation

## 2021-08-07 DIAGNOSIS — I619 Nontraumatic intracerebral hemorrhage, unspecified: Secondary | ICD-10-CM | POA: Diagnosis present

## 2021-08-07 DIAGNOSIS — C3491 Malignant neoplasm of unspecified part of right bronchus or lung: Secondary | ICD-10-CM

## 2021-08-07 LAB — CBC WITH DIFFERENTIAL (CANCER CENTER ONLY)
Abs Immature Granulocytes: 0 10*3/uL (ref 0.00–0.07)
Basophils Absolute: 0.1 10*3/uL (ref 0.0–0.1)
Basophils Relative: 1 %
Eosinophils Absolute: 1 10*3/uL — ABNORMAL HIGH (ref 0.0–0.5)
Eosinophils Relative: 21 %
HCT: 31.1 % — ABNORMAL LOW (ref 36.0–46.0)
Hemoglobin: 9.9 g/dL — ABNORMAL LOW (ref 12.0–15.0)
Immature Granulocytes: 0 %
Lymphocytes Relative: 21 %
Lymphs Abs: 1 10*3/uL (ref 0.7–4.0)
MCH: 31 pg (ref 26.0–34.0)
MCHC: 31.8 g/dL (ref 30.0–36.0)
MCV: 97.5 fL (ref 80.0–100.0)
Monocytes Absolute: 0.5 10*3/uL (ref 0.1–1.0)
Monocytes Relative: 10 %
Neutro Abs: 2.2 10*3/uL (ref 1.7–7.7)
Neutrophils Relative %: 47 %
Platelet Count: 206 10*3/uL (ref 150–400)
RBC: 3.19 MIL/uL — ABNORMAL LOW (ref 3.87–5.11)
RDW: 12.1 % (ref 11.5–15.5)
WBC Count: 4.7 10*3/uL (ref 4.0–10.5)
nRBC: 0 % (ref 0.0–0.2)

## 2021-08-07 LAB — CMP (CANCER CENTER ONLY)
ALT: 6 U/L (ref 0–44)
AST: 14 U/L — ABNORMAL LOW (ref 15–41)
Albumin: 3.6 g/dL (ref 3.5–5.0)
Alkaline Phosphatase: 88 U/L (ref 38–126)
Anion gap: 6 (ref 5–15)
BUN: 14 mg/dL (ref 8–23)
CO2: 25 mmol/L (ref 22–32)
Calcium: 8.7 mg/dL — ABNORMAL LOW (ref 8.9–10.3)
Chloride: 110 mmol/L (ref 98–111)
Creatinine: 1.03 mg/dL — ABNORMAL HIGH (ref 0.44–1.00)
GFR, Estimated: 57 mL/min — ABNORMAL LOW (ref >60)
Glucose, Bld: 68 mg/dL — ABNORMAL LOW (ref 70–99)
Potassium: 5 mmol/L (ref 3.5–5.1)
Sodium: 141 mmol/L (ref 135–145)
Total Bilirubin: 0.2 mg/dL — ABNORMAL LOW (ref 0.3–1.2)
Total Protein: 6.6 g/dL (ref 6.5–8.1)

## 2021-08-07 MED ORDER — SODIUM CHLORIDE (PF) 0.9 % IJ SOLN
INTRAMUSCULAR | Status: AC
  Filled 2021-08-07: qty 50

## 2021-08-07 MED ORDER — IOHEXOL 350 MG/ML SOLN
60.0000 mL | Freq: Once | INTRAVENOUS | Status: AC | PRN
  Administered 2021-08-07: 60 mL via INTRAVENOUS

## 2021-08-07 NOTE — Patient Instructions (Signed)
Adaptive driving

## 2021-08-07 NOTE — Progress Notes (Signed)
Subjective:    Patient ID: Tonya [name], female    DOB: 01/26/1946, 75 y.o.   MRN: [id number] 75 y.o. female with history of lung cancer s/p RUL lobectomy with ongoing chemo, bradycardia in the past, who was admitted via [place] [facility] on 02/22/2021 with reports of headache followed by confusion and progressive stuporous state.  CT head showed large intraparenchymal hemorrhage within right parietal lobe as well as mild right frontal subarachnoid hemorrhage and patient had tremulousness with concerns of seizure activity.  She was intubated for airway protection and transferred to [facility] for management.  Dr. Venetia Constable was consulted for input and recommended follow-up MRI/MRA to rule out mass or vascular abnormality.  MRI/MRA brain showed stable appearance of right parietal bleed without mass or vascular malformation, subdural subarachnoid hemorrhage and small amount of blood within the ventricles without evidence of stenosis, aneurysm or occlusion.  MRV of head was negative.     She was started on Keppra due to right upper extremity tremors.  EEG done showing cortical dysfunction with diffuse encephalopathy which was felt to be nonspecific.  Hospital significant for issues with tachycardia, elevated blood pressures as well as mild dysphagia.  Dr. Leonie Man was following for input and felt that bleed was likely hypertensive in nature but to repeat MRI of brain in 2 to 3 months due to unclear etiology of bleed.  She continued to have deficits due to left-sided weakness with right gaze preference, balance deficits, high-level cognitive deficits as well as issues with hallucinations.  CIR will was recommended due to functional decline. Admit date: 02/27/2021 Discharge date: 03/25/2021 HPI The patient lives at home with her husband.  She is independent with all of her basic self-care.  She needs supervision for cooking.  She has been observed to put the pot halfway on the burner or forget to  turn the burner off. She has not gone back to driving.  Her and her husband are interested in drivers rehabilitation program  Had normal eye exam from optometrist, planning to see a neuro-ophthalmologist in [facility] Pain Inventory Average Pain 5 Pain Right Now 5 My pain is stabbing and aching  In the last 24 hours, has pain interfered with the following? General activity 3 Relation with others 3 Enjoyment of life 3 What TIME of day is your pain at its worst? varies Sleep (in general) Fair  Pain is worse with: walking and sitting Pain improves with: medication Relief from Meds: 4  Family History  Problem Relation Age of Onset   Arrhythmia Mother        s/p Pacer    Heart attack Father    Heart disease Sister    Colon cancer Neg Hx    Social History   Socioeconomic History   Marital status: Married    Spouse name: Not on file   Number of children: 3   Years of education: Not on file   Highest education level: Not on file  Occupational History   Not on file  Tobacco Use   Smoking status: Former    Packs/day: 0.30    Years: 60.00    Pack years: 18.00    Types: Cigarettes    Quit date: 06/11/2020    Years since quitting: 1.1   Smokeless tobacco: Never  Vaping Use   Vaping Use: Never used  Substance and Sexual Activity   Alcohol use: Yes    Comment: 0.5 drink per month   Drug use: No   Sexual activity:  Not Currently    Birth control/protection: Surgical    Comment: Hysterectomy  Other Topics Concern   Not on file  Social History Narrative   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    Social Determinants of Health   Financial Resource Strain: Not on file  Food Insecurity: Not on file  Transportation Needs: Not on file  Physical Activity: Not on file  Stress: Not on file  Social Connections: Not on file   Past Surgical History:  Procedure Laterality Date   ABDOMINAL HYSTERECTOMY     APPENDECTOMY     BRONCHIAL BIOPSY  07/18/2020   Procedure: BRONCHIAL BIOPSIES;   Surgeon: [name], DO;  Location: [place] ENDOSCOPY;  Service: Pulmonary;;   BRONCHIAL BRUSHINGS  07/18/2020   Procedure: BRONCHIAL BRUSHINGS;  Surgeon: [name], DO;  Location: [place];  Service: Pulmonary;;   BRONCHIAL NEEDLE ASPIRATION BIOPSY  07/18/2020   Procedure: BRONCHIAL NEEDLE ASPIRATION BIOPSIES;  Surgeon: [name], DO;  Location: [place] ENDOSCOPY;  Service: Pulmonary;;   BRONCHIAL WASHINGS  07/18/2020   Procedure: BRONCHIAL WASHINGS;  Surgeon: [name], DO;  Location: [place] ENDOSCOPY;  Service: Pulmonary;;   FIDUCIAL MARKER PLACEMENT  07/18/2020   Procedure: FIDUCIAL MARKER PLACEMENT;  Surgeon: [name], DO;  Location: [place] ENDOSCOPY;  Service: Pulmonary;;   INTERCOSTAL NERVE BLOCK Right 08/26/2020   Procedure: INTERCOSTAL NERVE BLOCK;  Surgeon: [name], MD;  Location: [place];  Service: Thoracic;  Laterality: Right;   IR THORACENTESIS ASP PLEURAL SPACE W/IMG GUIDE  10/10/2020   NODE DISSECTION Right 08/26/2020   Procedure: NODE DISSECTION;  Surgeon: [name], MD;  Location: [place];  Service: Thoracic;  Laterality: Right;   THORACENTESIS Right 11/04/2020   Procedure: Mathews Robinsons;  Surgeon: [name], DO;  Location: [place] ENDOSCOPY;  Service: Pulmonary;  Laterality: Right;   THORACENTESIS Right 11/29/2020   Procedure: THORACENTESIS;  Surgeon: [name], DO;  Location: [facility];  Service: Pulmonary;  Laterality: Right;   VIDEO BRONCHOSCOPY WITH ENDOBRONCHIAL NAVIGATION N/A 07/18/2020   Procedure: VIDEO BRONCHOSCOPY WITH ENDOBRONCHIAL NAVIGATION;  Surgeon: [name], DO;  Location: [place];  Service: Pulmonary;  Laterality: N/A;   VIDEO BRONCHOSCOPY WITH ENDOBRONCHIAL ULTRASOUND  07/18/2020   Procedure: VIDEO BRONCHOSCOPY WITH ENDOBRONCHIAL ULTRASOUND;  Surgeon: [name], DO;  Location: [place] ENDOSCOPY;  Service: Pulmonary;;   Past Surgical History:  Procedure Laterality Date   ABDOMINAL HYSTERECTOMY      APPENDECTOMY     BRONCHIAL BIOPSY  07/18/2020   Procedure: BRONCHIAL BIOPSIES;  Surgeon: [name], DO;  Location: [place] ENDOSCOPY;  Service: Pulmonary;;   BRONCHIAL BRUSHINGS  07/18/2020   Procedure: BRONCHIAL BRUSHINGS;  Surgeon: [name], DO;  Location: [place];  Service: Pulmonary;;   BRONCHIAL NEEDLE ASPIRATION BIOPSY  07/18/2020   Procedure: BRONCHIAL NEEDLE ASPIRATION BIOPSIES;  Surgeon: [name], DO;  Location: [place];  Service: Pulmonary;;   BRONCHIAL WASHINGS  07/18/2020   Procedure: BRONCHIAL WASHINGS;  Surgeon: [name], DO;  Location: [place] ENDOSCOPY;  Service: Pulmonary;;   FIDUCIAL MARKER PLACEMENT  07/18/2020   Procedure: FIDUCIAL MARKER PLACEMENT;  Surgeon: [name], DO;  Location: [place] ENDOSCOPY;  Service: Pulmonary;;   INTERCOSTAL NERVE BLOCK Right 08/26/2020   Procedure: INTERCOSTAL NERVE BLOCK;  Surgeon: [name], MD;  Location: [place];  Service: Thoracic;  Laterality: Right;   IR THORACENTESIS ASP PLEURAL SPACE W/IMG GUIDE  10/10/2020   NODE DISSECTION Right 08/26/2020   Procedure:  NODE DISSECTION;  Surgeon: [name], MD;  Location: [place];  Service: Thoracic;  Laterality: Right;   THORACENTESIS Right 11/04/2020   Procedure: Mathews Robinsons;  Surgeon: [name], DO;  Location: [place] ENDOSCOPY;  Service: Pulmonary;  Laterality: Right;   THORACENTESIS Right 11/29/2020   Procedure: THORACENTESIS;  Surgeon: [name], DO;  Location: [facility] ENDOSCOPY;  Service: Pulmonary;  Laterality: Right;   VIDEO BRONCHOSCOPY WITH ENDOBRONCHIAL NAVIGATION N/A 07/18/2020   Procedure: VIDEO BRONCHOSCOPY WITH ENDOBRONCHIAL NAVIGATION;  Surgeon: [name], DO;  Location: [place];  Service: Pulmonary;  Laterality: N/A;   VIDEO BRONCHOSCOPY WITH ENDOBRONCHIAL ULTRASOUND  07/18/2020   Procedure: VIDEO BRONCHOSCOPY WITH ENDOBRONCHIAL ULTRASOUND;  Surgeon: [name], DO;  [facility];  Service:  Pulmonary;;   Past Medical History:  Diagnosis Date   Adenoma of left adrenal gland    Cancer ([place])    Lung   Colon polyps    hyperplastic   Complication of anesthesia    Very emotional and cries after anesthesia   GERD (gastroesophageal reflux disease)    High blood pressure 03/25/2021   Kidney stone    Liver lesion    Microhematuria    BP 130/75   Pulse (!) 55   Ht 5\' 4"  (1.626 m)   Wt 147 lb (66.7 kg)   SpO2 98%   BMI 25.23 kg/m   Opioid Risk Score:   Fall Risk Score:  `1  Depression screen PHQ 2/9  Depression screen [facility] 2/9 06/19/2021 04/15/2021  Decreased Interest 0 0  Down, Depressed, Hopeless 0 0  PHQ - 2 Score 0 0  Altered sleeping - 1  Tired, decreased energy - 1  Change in appetite - 1  Feeling bad or failure about yourself  - 0  Trouble concentrating - 0  Moving slowly or fidgety/restless - 0  PHQ-9 Score - 3  Some recent data might be hidden     Review of Systems  Musculoskeletal:        Pain on right side from breast to hip Right upper leg pain  All other systems reviewed and are negative.     Objective:   Physical Exam Vitals and nursing note reviewed.  HENT:     Head: Normocephalic and atraumatic.  Eyes:     Extraocular Movements: Extraocular movements intact.     Conjunctiva/sclera: Conjunctivae normal.     Pupils: Pupils are equal, round, and reactive to light.  Musculoskeletal:     Comments: Normal range of motion bilateral upper and lower limbs no pain with range of motion  Skin:    General: Skin is warm and dry.  Neurological:     Mental Status: She is oriented to person, place, and time.     Cranial Nerves: No dysarthria.     Sensory: No sensory deficit.     Motor: No weakness or abnormal muscle tone.     Coordination: Coordination is intact.     Gait: Tandem walk abnormal.     Comments: Visual fields are intact confrontation testing  Psychiatric:        Mood and Affect: Mood normal.          Assessment & Plan:   #1.   History of large right parietal intracranial hemorrhage, spontaneous, she has residual visual perceptual as well as cognitive deficits physically has recovered quite well.  Agree with drivers evaluation, continue outpatient speech therapy, follow-up PM&R 3 months.  No driving at this time, will discuss at  next visit

## 2021-08-11 ENCOUNTER — Ambulatory Visit: Payer: Medicare HMO | Admitting: Internal Medicine

## 2021-08-15 ENCOUNTER — Emergency Department (HOSPITAL_COMMUNITY): Payer: Medicare HMO

## 2021-08-15 ENCOUNTER — Encounter (HOSPITAL_COMMUNITY): Payer: Self-pay

## 2021-08-15 ENCOUNTER — Observation Stay (HOSPITAL_COMMUNITY)
Admission: EM | Admit: 2021-08-15 | Discharge: 2021-08-17 | Disposition: A | Discharge status: Home / Self Care | Payer: Medicare HMO | Attending: Student in an Organized Health Care Education/Training Program | Admitting: Student in an Organized Health Care Education/Training Program

## 2021-08-15 ENCOUNTER — Other Ambulatory Visit: Payer: Self-pay

## 2021-08-15 DIAGNOSIS — D649 Anemia, unspecified: Secondary | ICD-10-CM | POA: Diagnosis not present

## 2021-08-15 DIAGNOSIS — Y900 Blood alcohol level of less than 20 mg/100 ml: Secondary | ICD-10-CM | POA: Diagnosis not present

## 2021-08-15 DIAGNOSIS — I619 Nontraumatic intracerebral hemorrhage, unspecified: Secondary | ICD-10-CM

## 2021-08-15 DIAGNOSIS — R2990 Unspecified symptoms and signs involving the nervous system: Secondary | ICD-10-CM

## 2021-08-15 DIAGNOSIS — Z20822 Contact with and (suspected) exposure to covid-19: Secondary | ICD-10-CM | POA: Diagnosis not present

## 2021-08-15 DIAGNOSIS — I700 Atherosclerosis of aorta: Secondary | ICD-10-CM | POA: Insufficient documentation

## 2021-08-15 DIAGNOSIS — I10 Essential (primary) hypertension: Secondary | ICD-10-CM | POA: Diagnosis not present

## 2021-08-15 DIAGNOSIS — F419 Anxiety disorder, unspecified: Secondary | ICD-10-CM | POA: Insufficient documentation

## 2021-08-15 DIAGNOSIS — Z85118 Personal history of other malignant neoplasm of bronchus and lung: Secondary | ICD-10-CM | POA: Insufficient documentation

## 2021-08-15 DIAGNOSIS — H538 Other visual disturbances: Principal | ICD-10-CM | POA: Insufficient documentation

## 2021-08-15 DIAGNOSIS — R2689 Other abnormalities of gait and mobility: Secondary | ICD-10-CM | POA: Diagnosis not present

## 2021-08-15 DIAGNOSIS — G43909 Migraine, unspecified, not intractable, without status migrainosus: Secondary | ICD-10-CM | POA: Diagnosis not present

## 2021-08-15 DIAGNOSIS — H53462 Homonymous bilateral field defects, left side: Secondary | ICD-10-CM | POA: Diagnosis not present

## 2021-08-15 DIAGNOSIS — Z79899 Other long term (current) drug therapy: Secondary | ICD-10-CM | POA: Insufficient documentation

## 2021-08-15 DIAGNOSIS — G43809 Other migraine, not intractable, without status migrainosus: Secondary | ICD-10-CM

## 2021-08-15 DIAGNOSIS — Z87891 Personal history of nicotine dependence: Secondary | ICD-10-CM | POA: Diagnosis not present

## 2021-08-15 DIAGNOSIS — J439 Emphysema, unspecified: Secondary | ICD-10-CM | POA: Diagnosis not present

## 2021-08-15 HISTORY — DX: Cerebral infarction, unspecified: I63.9

## 2021-08-15 LAB — URINALYSIS, ROUTINE W REFLEX MICROSCOPIC
Bilirubin Urine: NEGATIVE
Glucose, UA: NEGATIVE mg/dL
Ketones, ur: NEGATIVE mg/dL
Nitrite: NEGATIVE
Protein, ur: NEGATIVE mg/dL
Specific Gravity, Urine: 1.01 (ref 1.005–1.030)
pH: 6 (ref 5.0–8.0)

## 2021-08-15 LAB — DIFFERENTIAL
Abs Immature Granulocytes: 0.01 10*3/uL (ref 0.00–0.07)
Basophils Absolute: 0 10*3/uL (ref 0.0–0.1)
Basophils Relative: 1 %
Eosinophils Absolute: 0.1 10*3/uL (ref 0.0–0.5)
Eosinophils Relative: 3 %
Immature Granulocytes: 0 %
Lymphocytes Relative: 15 %
Lymphs Abs: 0.8 10*3/uL (ref 0.7–4.0)
Monocytes Absolute: 0.5 10*3/uL (ref 0.1–1.0)
Monocytes Relative: 9 %
Neutro Abs: 3.8 10*3/uL (ref 1.7–7.7)
Neutrophils Relative %: 72 %

## 2021-08-15 LAB — URINALYSIS, MICROSCOPIC (REFLEX)

## 2021-08-15 LAB — CBC
HCT: 35.2 % — ABNORMAL LOW (ref 36.0–46.0)
Hemoglobin: 11.3 g/dL — ABNORMAL LOW (ref 12.0–15.0)
MCH: 31 pg (ref 26.0–34.0)
MCHC: 32.1 g/dL (ref 30.0–36.0)
MCV: 96.7 fL (ref 80.0–100.0)
Platelets: 240 10*3/uL (ref 150–400)
RBC: 3.64 MIL/uL — ABNORMAL LOW (ref 3.87–5.11)
RDW: 12 % (ref 11.5–15.5)
WBC: 5.2 10*3/uL (ref 4.0–10.5)
nRBC: 0 % (ref 0.0–0.2)

## 2021-08-15 LAB — COMPREHENSIVE METABOLIC PANEL
ALT: 8 U/L (ref 0–44)
AST: 14 U/L — ABNORMAL LOW (ref 15–41)
Albumin: 3.9 g/dL (ref 3.5–5.0)
Alkaline Phosphatase: 76 U/L (ref 38–126)
Anion gap: 9 (ref 5–15)
BUN: 12 mg/dL (ref 8–23)
CO2: 22 mmol/L (ref 22–32)
Calcium: 9.7 mg/dL (ref 8.9–10.3)
Chloride: 106 mmol/L (ref 98–111)
Creatinine, Ser: 1 mg/dL (ref 0.44–1.00)
GFR, Estimated: 59 mL/min — ABNORMAL LOW (ref >60)
Glucose, Bld: 107 mg/dL — ABNORMAL HIGH (ref 70–99)
Potassium: 4.1 mmol/L (ref 3.5–5.1)
Sodium: 137 mmol/L (ref 135–145)
Total Bilirubin: 0.6 mg/dL (ref 0.3–1.2)
Total Protein: 7.3 g/dL (ref 6.5–8.1)

## 2021-08-15 LAB — RAPID URINE DRUG SCREEN, HOSP PERFORMED
Amphetamines: NOT DETECTED
Barbiturates: NOT DETECTED
Benzodiazepines: POSITIVE — AB
Cocaine: NOT DETECTED
Opiates: POSITIVE — AB
Tetrahydrocannabinol: NOT DETECTED

## 2021-08-15 LAB — I-STAT CHEM 8, ED
BUN: 14 mg/dL (ref 8–23)
Calcium, Ion: 1.27 mmol/L (ref 1.15–1.40)
Chloride: 108 mmol/L (ref 98–111)
Creatinine, Ser: 0.9 mg/dL (ref 0.44–1.00)
Glucose, Bld: 103 mg/dL — ABNORMAL HIGH (ref 70–99)
HCT: 36 % (ref 36.0–46.0)
Hemoglobin: 12.2 g/dL (ref 12.0–15.0)
Potassium: 4.2 mmol/L (ref 3.5–5.1)
Sodium: 140 mmol/L (ref 135–145)
TCO2: 24 mmol/L (ref 22–32)

## 2021-08-15 LAB — RESP PANEL BY RT-PCR (FLU A&B, COVID) ARPGX2
Influenza A by PCR: NEGATIVE
Influenza B by PCR: NEGATIVE
SARS Coronavirus 2 by RT PCR: NEGATIVE

## 2021-08-15 LAB — ETHANOL: Alcohol, Ethyl (B): <10 mg/dL (ref <10)

## 2021-08-15 LAB — SEDIMENTATION RATE: Sed Rate: 26 mm/hr — ABNORMAL HIGH (ref 0–22)

## 2021-08-15 LAB — APTT: aPTT: 28 seconds (ref 24–36)

## 2021-08-15 LAB — CBG MONITORING, ED: Glucose-Capillary: 99 mg/dL (ref 70–99)

## 2021-08-15 LAB — PROTIME-INR
INR: 1 (ref 0.8–1.2)
Prothrombin Time: 13.3 seconds (ref 11.4–15.2)

## 2021-08-15 LAB — C-REACTIVE PROTEIN: CRP: 0.6 mg/dL (ref <1.0)

## 2021-08-15 MED ORDER — IOHEXOL 350 MG/ML SOLN
75.0000 mL | Freq: Once | INTRAVENOUS | Status: AC | PRN
  Administered 2021-08-15: 75 mL via INTRAVENOUS

## 2021-08-15 MED ORDER — ACETAMINOPHEN 500 MG PO TABS
1000.0000 mg | ORAL_TABLET | Freq: Once | ORAL | Status: AC
  Administered 2021-08-15: 1000 mg via ORAL
  Filled 2021-08-15: qty 2

## 2021-08-15 MED ORDER — ENOXAPARIN SODIUM 40 MG/0.4ML IJ SOSY
40.0000 mg | PREFILLED_SYRINGE | INTRAMUSCULAR | Status: DC
  Administered 2021-08-15 – 2021-08-16 (×2): 40 mg via SUBCUTANEOUS
  Filled 2021-08-15 (×2): qty 0.4

## 2021-08-15 MED ORDER — ACETAMINOPHEN 325 MG PO TABS
650.0000 mg | ORAL_TABLET | Freq: Four times a day (QID) | ORAL | Status: DC | PRN
  Administered 2021-08-16 – 2021-08-17 (×4): 650 mg via ORAL
  Filled 2021-08-15 (×6): qty 2

## 2021-08-15 MED ORDER — SODIUM CHLORIDE 0.9 % IV BOLUS
500.0000 mL | Freq: Once | INTRAVENOUS | Status: AC
  Administered 2021-08-15: 500 mL via INTRAVENOUS

## 2021-08-15 MED ORDER — ROSUVASTATIN CALCIUM 5 MG PO TABS
10.0000 mg | ORAL_TABLET | Freq: Every day | ORAL | Status: DC
  Administered 2021-08-15 – 2021-08-16 (×2): 10 mg via ORAL
  Filled 2021-08-15 (×2): qty 2

## 2021-08-15 MED ORDER — IBUPROFEN 200 MG PO TABS
600.0000 mg | ORAL_TABLET | Freq: Once | ORAL | Status: AC
  Administered 2021-08-15: 600 mg via ORAL
  Filled 2021-08-15: qty 3

## 2021-08-15 MED ORDER — ACETAMINOPHEN 650 MG RE SUPP: 650.0000 mg | Freq: Four times a day (QID) | RECTAL | Status: DC | PRN

## 2021-08-15 MED ORDER — PANTOPRAZOLE SODIUM 40 MG PO TBEC
40.0000 mg | DELAYED_RELEASE_TABLET | Freq: Every day | ORAL | Status: DC
  Administered 2021-08-15 – 2021-08-17 (×3): 40 mg via ORAL
  Filled 2021-08-15 (×3): qty 1

## 2021-08-15 NOTE — ED Notes (Signed)
Pt given refreshments; spouse to bring in their own food.

## 2021-08-15 NOTE — ED Notes (Signed)
Patient transported to CT 

## 2021-08-15 NOTE — ED Triage Notes (Signed)
Pt from home BIB EMS for c/o HA, left eye vision changes, difficulty walking and grabbing things. [place] noticing those sx. Hx of hemorrhagic stroke in June with left side weakness as a deficit. Pt states the weakness has become worse. No slurred speech or other sx.  A/Ox4 20 RAC 160/80 HR 70 99% RA CBG 122

## 2021-08-15 NOTE — ED Provider Notes (Signed)
[facility] EMERGENCY DEPARTMENT Provider Note   CSN: [id number] Arrival date & time: 08/15/21  1154     History Chief Complaint  Patient presents with   Dizziness   Headache    Tonya [name] is a 75 y.o. female history of GERD, hypertension, CVA, lung cancer, obesity.  Patient reports that [place] the beach [place] around 4 PM she noticed some difficulty with her balance, left-sided vision change (blurry vision) and difficulty with picking things up with her left arm.  Symptoms have been constant since onset, they drove back to [facility] yesterday, when patient woke up this morning she feels that she has had worsening trouble picking things up with her left arm.  She does report a history of stroke in June of this year and has had weakness to her left arm and leg since that time but feels it is worse since yesterday.  She denies fall, injury, fever, chills, chest pain, shortness of breath, abdominal pain, nausea, vomiting or any additional concerns.  HPI     Past Medical History:  Diagnosis Date   Adenoma of left adrenal gland    Cancer ([place])    Lung   Colon polyps    hyperplastic   Complication of anesthesia    Very emotional and cries after anesthesia   GERD (gastroesophageal reflux disease)    High blood pressure 03/25/2021   Kidney stone    Liver lesion    Microhematuria    Stroke [facility])     Patient Active Problem List   Diagnosis Date Noted   Swelling of both lower extremities 05/08/2021   Frequent headaches 03/25/2021   Sciatica of left side 03/25/2021   Chronic low back pain 03/25/2021   Anxiety disorder 03/25/2021   Intraparenchymal hematoma of brain 02/27/2021   Stroke, 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   Intracranial hemorrhage ([place]) 02/22/2021   Chemotherapy induced neutropenia ([place]) 01/08/2021   Bradycardia 01/01/2021   Hypomagnesemia 12/10/2020   Nausea without vomiting 12/10/2020   Status post thoracentesis     Pleural effusion, right 11/01/2020   Adenocarcinoma of right lung, stage 2 ([place]) 09/11/2020   Encounter for antineoplastic chemotherapy 09/11/2020   S/P Robotic Assited Video Thoracoscopy with Right Upper Lobectomy Lung 08/26/2020   Mediastinal adenopathy 07/18/2020   Lung nodules    Lung nodule 06/14/2020   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   Tobacco dependence 05/29/2020   Exertional chest pain 05/28/2020   Numbness and tingling of left arm and leg 07/01/2012   Hyperlipidemia with target low density lipoprotein (LDL) cholesterol less than 100 mg/dL 07/01/2012   Chest pressure 06/30/2012   GERD (gastroesophageal reflux disease) 06/30/2012    Past Surgical History:  Procedure Laterality Date   ABDOMINAL HYSTERECTOMY     APPENDECTOMY     BRONCHIAL BIOPSY  07/18/2020   Procedure: BRONCHIAL BIOPSIES;  Surgeon: [name], DO;  Location: [place] ENDOSCOPY;  Service: Pulmonary;;   BRONCHIAL BRUSHINGS  07/18/2020   Procedure: BRONCHIAL BRUSHINGS;  Surgeon: [name], DO;  Location: [place] ENDOSCOPY;  Service: Pulmonary;;   BRONCHIAL NEEDLE ASPIRATION BIOPSY  07/18/2020   Procedure: BRONCHIAL NEEDLE ASPIRATION BIOPSIES;  Surgeon: [name], DO;  Location: [place] ENDOSCOPY;  Service: Pulmonary;;   BRONCHIAL WASHINGS  07/18/2020   Procedure: BRONCHIAL WASHINGS;  Surgeon: [name], DO;  Location: [place] ENDOSCOPY;  Service: Pulmonary;;   FIDUCIAL MARKER PLACEMENT  07/18/2020   Procedure: FIDUCIAL MARKER PLACEMENT;  Surgeon: [name], DO;  Location: [place] ENDOSCOPY;  Service: Pulmonary;;   INTERCOSTAL NERVE BLOCK Right 08/26/2020   Procedure: INTERCOSTAL NERVE BLOCK;  Surgeon: [name], MD;  Location: [place];  Service: Thoracic;  Laterality: Right;   IR THORACENTESIS ASP PLEURAL SPACE W/IMG GUIDE  10/10/2020   NODE DISSECTION Right 08/26/2020   Procedure: NODE DISSECTION;  Surgeon: [name], MD;  Location: [place];  Service: Thoracic;  Laterality: Right;    THORACENTESIS Right 11/04/2020   Procedure: [facility]insons;  Surgeon: [name], DO;  Location: [place] ENDOSCOPY;  Service: Pulmonary;  Laterality: Right;   THORACENTESIS Right 11/29/2020   Procedure: THORACENTESIS;  Surgeon: [name], DO;  Location: [facility]OSCOPY;  Service: Pulmonary;  Laterality: Right;   VIDEO BRONCHOSCOPY WITH ENDOBRONCHIAL NAVIGATION N/A 07/18/2020   Procedure: VIDEO BRONCHOSCOPY WITH ENDOBRONCHIAL NAVIGATION;  Surgeon: [name], DO;  Location: [place];  Service: Pulmonary;  Laterality: N/A;   VIDEO BRONCHOSCOPY WITH ENDOBRONCHIAL ULTRASOUND  07/18/2020   Procedure: VIDEO BRONCHOSCOPY WITH ENDOBRONCHIAL ULTRASOUND;  Surgeon: [name], DO;  Location: [place] ENDOSCOPY;  Service: Pulmonary;;     OB History   No obstetric history on file.     Family History  Problem Relation Age of Onset   Arrhythmia Mother        s/p Pacer    Heart attack Father    Heart disease Sister    Colon cancer Neg Hx     Social History   Tobacco Use   Smoking status: Former    Packs/day: 0.30    Years: 60.00    Pack years: 18.00    Types: Cigarettes    Quit date: 06/11/2020    Years since quitting: 1.1   Smokeless tobacco: Never  Vaping Use   Vaping Use: Never used  Substance Use Topics   Alcohol use: Yes    Comment: 0.5 drink per month   Drug use: No    Home Medications Prior to Admission medications   Medication Sig Start Date End Date Taking? Authorizing Provider  acetaminophen (TYLENOL) 325 MG tablet Take 1-2 tablets (325-650 mg total) by mouth every 4 (four) hours as needed for mild pain. 03/25/21   Love, Ivan Anchors, PA-C  albuterol (VENTOLIN HFA) 108 (90 Base) MCG/ACT inhaler Inhale 2 puffs into the lungs every 6 (six) hours as needed for wheezing or shortness of breath. 01/15/21   [name], DO  alendronate (FOSAMAX) 70 MG tablet Take 70 mg by mouth every Saturday. 07/04/20   [provider]  ALPRAZolam Duanne Moron) 1 MG tablet Take 1 mg  by mouth in the morning, at noon, and at bedtime.    [provider]  Calcium Carbonate-Vitamin D (CALCIUM + D PO) Take 1 tablet by mouth 2 (two) times daily.    [provider]  folic acid (FOLVITE) 1 MG tablet TAKE 1 TABLET(1 MG) BY MOUTH DAILY 03/07/21   Curt Bears, MD  HYDROcodone-acetaminophen [facility]) 10-325 MG tablet Take by mouth. 07/18/21   [provider]  lidocaine (LIDODERM) 5 % Place 3 patches onto the skin daily. Apply at 8 am and remove at 8 pm daily. Patient taking differently: Place 3 patches onto the skin as needed. Apply at 8 am and remove at 8 pm daily. 03/25/21   Love, Ivan Anchors, PA-C  magnesium oxide (MAG-OX) 400 (241.3 Mg) MG tablet Take 1 tablet (400 mg total) by mouth 2 (two) times daily. 03/25/21   Love, Ivan Anchors, PA-C  Menthol-Methyl Salicylate (MUSCLE RUB) 10-15 % CREA Apply 1  application topically 2 (two) times daily. 03/25/21   Love, Ivan Anchors, PA-C  NARCAN 4 MG/0.1ML LIQD nasal spray kit SMARTSIG:Both Nares 05/17/21   [provider]  omeprazole (PRILOSEC) 40 MG capsule Take 40 mg by mouth daily.    [provider]  oxyCODONE-acetaminophen (PERCOCET) 10-325 MG tablet Take 1 tablet by mouth every 4 (four) hours as needed. 07/15/21   [provider]  oxyCODONE-acetaminophen (PERCOCET/ROXICET) 5-325 MG tablet Take 1-2 tablets by mouth 2 (two) times daily as needed for severe pain (severe pain). 03/25/21   Love, Ivan Anchors, PA-C  rosuvastatin (CRESTOR) 10 MG tablet Take 10 mg by mouth at bedtime. 10/25/20   [provider]  umeclidinium-vilanterol (ANORO ELLIPTA) 62.5-25 MCG/INH AEPB Inhale 1 puff into the lungs daily. Patient taking differently: Inhale 1 puff into the lungs as needed. 03/26/21   Love, Ivan Anchors, PA-C    Allergies    Other, Penicillins, and Tizanidine  Review of Systems   Review of Systems Ten systems are reviewed and are negative for acute change except as noted in the HPI  Physical Exam Updated  Vital Signs BP (!) 168/70   Pulse 63   Temp 98.8 F (37.1 C) (Oral)   Resp 15   SpO2 100%   Physical Exam Constitutional:      General: She is not in acute distress.    Appearance: Normal appearance. She is well-developed. She is not ill-appearing or diaphoretic.  HENT:     Head: Normocephalic and atraumatic.  Eyes:     General: Vision grossly intact. Gaze aligned appropriately.     Pupils: Pupils are equal, round, and reactive to light.  Neck:     Trachea: Trachea and phonation normal.  Pulmonary:     Effort: Pulmonary effort is normal. No respiratory distress.  Abdominal:     General: There is no distension.     Palpations: Abdomen is soft.     Tenderness: There is no abdominal tenderness. There is no guarding or rebound.  Musculoskeletal:        General: Normal range of motion.     Cervical back: Normal range of motion.  Skin:    General: Skin is warm and dry.  Neurological:     Mental Status: She is alert.     GCS: GCS eye subscore is 4. GCS verbal subscore is 5. GCS motor subscore is 6.     Comments: Speech is clear and goal oriented, follows commands Major Cranial nerves without deficit, no facial droop 3/5 strength with left arm left leg compared to right side Sensation normal to light and sharp touch Moves extremities without ataxia, coordination intact  Psychiatric:        Behavior: Behavior normal.    ED Results / Procedures / Treatments   Labs (all labs ordered are listed, but only abnormal results are displayed) Labs Reviewed  CBC - Abnormal; Notable for the following components:      Result Value   RBC 3.64 (*)    Hemoglobin 11.3 (*)    HCT 35.2 (*)    All other components within normal limits  COMPREHENSIVE METABOLIC PANEL - Abnormal; Notable for the following components:   Glucose, Bld 107 (*)    AST 14 (*)    GFR, Estimated 59 (*)    All other components within normal limits  RAPID URINE DRUG SCREEN, HOSP PERFORMED - Abnormal; Notable for the  following components:   Opiates POSITIVE (*)    Benzodiazepines POSITIVE (*)  All other components within normal limits  I-STAT CHEM 8, ED - Abnormal; Notable for the following components:   Glucose, Bld 103 (*)    All other components within normal limits  RESP PANEL BY RT-PCR (FLU A&B, COVID) ARPGX2  ETHANOL  PROTIME-INR  APTT  DIFFERENTIAL  URINALYSIS, ROUTINE W REFLEX MICROSCOPIC  CBG MONITORING, ED    EKG EKG Interpretation  Date/Time:  Friday August 15 2021 12:04:15 EST Ventricular Rate:  64 PR Interval:  143 QRS Duration: 91 QT Interval:  396 QTC Calculation: 409 R Axis:   12 Text Interpretation: Sinus rhythm Abnormal R-wave progression, early transition Nonspecific T abnormalities, anterior leads No significant change since last tracing Confirmed by Calvert Cantor [phone]) on 08/15/2021 1:11:10 PM  Radiology CT ANGIO HEAD NECK W WO CM  Result Date: 08/15/2021 CLINICAL DATA:  Acute neuro deficit. Left eye vision change. Difficulty walking. History of stroke EXAM: CT ANGIOGRAPHY HEAD AND NECK TECHNIQUE: Multidetector CT imaging of the head and neck was performed using the standard protocol during bolus administration of intravenous contrast. Multiplanar CT image reconstructions and MIPs were obtained to evaluate the vascular anatomy. Carotid stenosis measurements (when applicable) are obtained utilizing NASCET criteria, using the distal internal carotid diameter as the denominator. CONTRAST:  82mL OMNIPAQUE IOHEXOL 350 MG/ML SOLN COMPARISON:  CT head 08/15/2021 FINDINGS: CT HEAD FINDINGS Brain: Chronic infarct in the right medial parietal lobe unchanged. No superimposed acute infarct, hemorrhage, mass. Ventricle size normal. Vascular: Negative for hyperdense vessel Skull: Negative Sinuses: Negative Orbits: Negative Review of the MIP images confirms the above findings CTA NECK FINDINGS Aortic arch: Standard branching. Imaged portion shows no evidence of aneurysm or dissection.  No significant stenosis of the major arch vessel origins. Mild atherosclerotic disease in the aortic arch. Right carotid system: Right carotid widely patent. Negative for atherosclerotic disease or dissection. Tortuosity of the right internal carotid artery. Left carotid system: Mild atherosclerotic disease left carotid bifurcation. Negative for stenosis. Tortuosity of the left internal carotid artery. Vertebral arteries: Both vertebral arteries patent to the basilar. Skeleton: No acute skeletal abnormality.  Multiple dental caries. Other neck: Negative for soft tissue mass or adenopathy. Mild thyromegaly without significant nodularity. Upper chest: Mild apical emphysema. 8 mm spiculated nodule left upper lobe unchanged from CT chest 02/04/2021. Review of the MIP images confirms the above findings CTA HEAD FINDINGS Anterior circulation: Cavernous carotid widely patent bilaterally. Anterior and middle cerebral arteries are widely patent bilaterally. Negative for large vessel occlusion. Posterior circulation: Both vertebral arteries patent to the basilar. PICA patent bilaterally. Basilar widely patent. Posterior cerebral arteries widely patent bilaterally. Fetal origin of the right posterior cerebral artery. Venous sinuses: Normal venous enhancement Anatomic variants: None Review of the MIP images confirms the above findings IMPRESSION: 1. Negative for intracranial stenosis or large vessel occlusion 2. No significant carotid or vertebral artery stenosis in the neck. 3. CT head demonstrates no acute abnormality. Chronic infarct right parietal lobe 4. 8 mm spiculated nodule left upper lobe, unchanged from prior chest CT 02/04/2021 Electronically Signed   By: Franchot Gallo M.D.   On: 08/15/2021 14:48   CT HEAD WO CONTRAST  Result Date: 08/15/2021 CLINICAL DATA:  Acute neuro deficit.  History of stroke. EXAM: CT HEAD WITHOUT CONTRAST TECHNIQUE: Contiguous axial images were obtained from the base of the skull through  the vertex without intravenous contrast. COMPARISON:  CT head 03/02/2021.  MRI head 07/04/2021 FINDINGS: Brain: Chronic encephalomalacia right medial parietal lobe unchanged from prior MRI. Negative for acute infarct,  hemorrhage, mass. Ventricle size normal. Vascular: Negative for hyperdense vessel Skull: Negative Sinuses/Orbits: Paranasal sinuses clear.  Negative orbit Other: None IMPRESSION: No acute abnormality.  Chronic infarct right parietal lobe. Electronically Signed   By: Franchot Gallo M.D.   On: 08/15/2021 12:45    Procedures Procedures   Medications Ordered in ED Medications  sodium chloride 0.9 % bolus 500 mL (has no administration in time range)  iohexol (OMNIPAQUE) 350 MG/ML injection 75 mL (75 mLs Intravenous Contrast Given 08/15/21 1422)    ED Course  I have reviewed the triage vital signs and the nursing notes.  Pertinent labs & imaging results that were available during my care of the patient were reviewed by me and considered in my medical decision making (see chart for details).    MDM Rules/Calculators/A&P                           Additional history obtained from: Nursing notes from this visit. Review of electronic medical records. Patient's husband at bedside. --------------- 75 year old female presented for difficulty using her left arm, blurriness of the left eye and difficulty with balance onset yesterday 4 PM.  Patient is outside of stroke window on arrival.  [place] negative.  Will obtain CVA work-up and CT head.  Discussed with Dr. Karle Starch who agrees. -------------------- I ordered, reviewed and interpreted labs which include: CMP shows no emergent lecture derangement, AKI, LFT elevations or gap. Ethanol is negative. INR 1.0. CMP shows mild anemia of 11.3, no leuks ptosis or thrombocytopenia. CBG 99. COVID/Vaughns panel negative. UDS shows opiates and benzodiazepines both of which patient is prescribed.  CT Head:  IMPRESSION:  No acute abnormality.   Chronic infarct right parietal lobe.  -------------- Spoke with on-call neurologist who recommends obtaining CT angio head neck and MRI brain without contrast and then reconsult them for further recommendations before anticipating admitting.  Patient was reassessed she is resting company bed no acute distress, no change to symptoms.  Patient is agreeable to above imaging, she denies any pacemaker or metallic foreign bodies. =-== CTA Head/Neck:  IMPRESSION:  1. Negative for intracranial stenosis or large vessel occlusion  2. No significant carotid or vertebral artery stenosis in the neck.  3. CT head demonstrates no acute abnormality. Chronic infarct right  parietal lobe  4. 8 mm spiculated nodule left upper lobe, unchanged from prior  chest CT 02/04/2021   Currently awaiting MRI brain.  Care handoff given to Dr. Dolores Frame at shift change.  Plan of care is to await MRI and then to consult neurology for further recommendations.  Note: Portions of this report may have been transcribed using voice recognition software. Every effort was made to ensure accuracy; however, inadvertent computerized transcription errors may still be present.  Final Clinical Impression(s) / ED Diagnoses Final diagnoses:  None    Rx / DC Orders ED Discharge Orders     None        Gari Crown 08/15/21 1525    Truddie Hidden, MD 08/15/21 2010

## 2021-08-15 NOTE — ED Notes (Addendum)
Patient in MRI 

## 2021-08-15 NOTE — Consult Note (Addendum)
Ophthalmology Initial Consult Note  Tonya [name], 75 y.o. female Date of Service:  08/15/2021  Requesting physician: Axel Filler, *  Information Obtained from: Patient, chart Chief Complaint:  Painful vision loss left eye  HPI/Discussion:  Tonya [name] is a 75 y.o. female who presents with painful vision loss in the left eye. 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 She ipresents with a 24-hour history of dizziness, balance issues, and a headache behind her left eye. She said that she had a headache yesterday while at beach and later that day she woke up and could not see out of her left eye. She describes only being able to see shadows. The headache is still present today, she describes it as a constant throbbing pain that feels like someone is taking an ice pick behind her left eye. She also has soreness/pain along the side of her temple, her neck, and the back of her neck. She endorses a history of migraines with no aura but did have photosensitivity. She does not think this feels like a migraine. The vision loss has gotten a lot better over today and is basically back to normal. She still has photophobia.    Past Ocular Hx:  Noncontributory Ocular Meds:  None Family ocular history: Noncontributory  Past Medical History:  Diagnosis Date   Adenoma of left adrenal gland    Cancer ([place])    Lung   Colon polyps    hyperplastic   Complication of anesthesia    Very emotional and cries after anesthesia   GERD (gastroesophageal reflux disease)    High blood pressure 03/25/2021   Kidney stone    Liver lesion    Microhematuria    Stroke [facility])    Past Surgical History:  Procedure Laterality Date   ABDOMINAL HYSTERECTOMY     APPENDECTOMY     BRONCHIAL BIOPSY  07/18/2020   Procedure: BRONCHIAL BIOPSIES;  Surgeon: [name], DO;  Location: [place] [facility];  Service: Pulmonary;;   BRONCHIAL BRUSHINGS  07/18/2020   Procedure: BRONCHIAL BRUSHINGS;   Surgeon: [name], DO;  Location: [place] [facility];  Service: Pulmonary;;   BRONCHIAL NEEDLE ASPIRATION BIOPSY  07/18/2020   Procedure: BRONCHIAL NEEDLE ASPIRATION BIOPSIES;  Surgeon: [name], DO;  Location: [place] [facility];  Service: Pulmonary;;   BRONCHIAL WASHINGS  07/18/2020   Procedure: BRONCHIAL WASHINGS;  Surgeon: [name], DO;  Location: [place] [facility];  Service: Pulmonary;;   FIDUCIAL MARKER PLACEMENT  07/18/2020   Procedure: FIDUCIAL MARKER PLACEMENT;  Surgeon: [name], DO;  Location: [place] [facility];  Service: Pulmonary;;   INTERCOSTAL NERVE BLOCK Right 08/26/2020   Procedure: INTERCOSTAL NERVE BLOCK;  Surgeon: [name], MD;  Location: [place];  Service: Thoracic;  Laterality: Right;   IR THORACENTESIS ASP PLEURAL SPACE W/IMG GUIDE  10/10/2020   NODE DISSECTION Right 08/26/2020   Procedure: NODE DISSECTION;  Surgeon: [name], MD;  Location: [place];  Service: Thoracic;  Laterality: Right;   THORACENTESIS Right 11/04/2020   Procedure: [facility];  Surgeon: [name], DO;  Location: [place] [facility];  Service: Pulmonary;  Laterality: Right;   THORACENTESIS Right 11/29/2020   Procedure: THORACENTESIS;  Surgeon: [name], DO;  Location: [facility];  Service: Pulmonary;  Laterality: Right;   VIDEO BRONCHOSCOPY WITH ENDOBRONCHIAL NAVIGATION N/A 07/18/2020   Procedure: VIDEO BRONCHOSCOPY WITH ENDOBRONCHIAL NAVIGATION;  Surgeon: [name], DO;  Location: [place];  Service: Pulmonary;  Laterality: N/A;   VIDEO  BRONCHOSCOPY WITH ENDOBRONCHIAL ULTRASOUND  07/18/2020   Procedure: VIDEO BRONCHOSCOPY WITH ENDOBRONCHIAL ULTRASOUND;  Surgeon: [name], DO;  Location: [place] [facility];  Service: Pulmonary;;    Prior to Admission Meds: Medications Prior to Admission  Medication Sig Dispense Refill Last Dose   acetaminophen (TYLENOL) 325 MG tablet Take 1-2 tablets (325-650 mg total) by mouth every 4 (four) hours as needed for  mild pain.   unk   albuterol (VENTOLIN HFA) 108 (90 Base) MCG/ACT inhaler Inhale 2 puffs into the lungs every 6 (six) hours as needed for wheezing or shortness of breath. 8 g 2 08/14/2021   alendronate (FOSAMAX) 70 MG tablet Take 70 mg by mouth every Saturday.   08/02/2021   ALPRAZolam (XANAX) 1 MG tablet Take 1 mg by mouth at bedtime as needed for sleep.   Past Month   Calcium Carbonate-Vitamin D (CALCIUM + D PO) Take 1 tablet by mouth 2 (two) times daily.   08/14/2021   HYDROcodone-acetaminophen (NORCO) 10-325 MG tablet Take 1 tablet by mouth every 6 (six) hours as needed for moderate pain.   08/14/2021   lidocaine (LIDODERM) 5 % Place 3 patches onto the skin daily. Apply at 8 am and remove at 8 pm daily. (Patient taking differently: Place 3 patches onto the skin as needed. Apply at 8 am and remove at 8 pm daily.) 90 patch 0 unk   Menthol-Methyl Salicylate (MUSCLE RUB) 10-15 % CREA Apply 1 application topically 2 (two) times daily. (Patient taking differently: Apply 1 application topically daily as needed for muscle pain.)  0 Past Month   omeprazole (PRILOSEC) 40 MG capsule Take 40 mg by mouth daily.   08/14/2021   oxyCODONE-acetaminophen (PERCOCET) 10-325 MG tablet Take 1 tablet by mouth every 4 (four) hours as needed.   Past Week   rosuvastatin (CRESTOR) 10 MG tablet Take 10 mg by mouth at bedtime.   47/02/5464   folic acid (FOLVITE) 1 MG tablet TAKE 1 TABLET(1 MG) BY MOUTH DAILY (Patient not taking: Reported on 08/15/2021) 30 tablet 4 Not Taking   magnesium oxide (MAG-OX) 400 (241.3 Mg) MG tablet Take 1 tablet (400 mg total) by mouth 2 (two) times daily. (Patient not taking: Reported on 08/15/2021) 60 tablet 1 Not Taking   NARCAN 4 MG/0.1ML LIQD nasal spray kit 1 spray once.      oxyCODONE-acetaminophen (PERCOCET/ROXICET) 5-325 MG tablet Take 1-2 tablets by mouth 2 (two) times daily as needed for severe pain (severe pain). 28 tablet 0    umeclidinium-vilanterol (ANORO ELLIPTA) 62.5-25 MCG/INH AEPB  Inhale 1 puff into the lungs daily. (Patient taking differently: Inhale 1 puff into the lungs as needed.) 60 each 0     Inpatient Meds: $RemoveBeforeD'@[email or web address]'[id number]$ @  Allergies  Allergen Reactions   Other Anaphylaxis    Spiders   Penicillins Hives    REACTION: 20 years   Tizanidine     Significant hypotension   Social History   Tobacco Use   Smoking status: Former    Packs/day: 0.30    Years: 60.00    Pack years: 18.00    Types: Cigarettes    Quit date: 06/11/2020    Years since quitting: 1.1   Smokeless tobacco: Never  Substance Use Topics   Alcohol use: Yes    Comment: 0.5 drink per month   Family History  Problem Relation Age of Onset   Arrhythmia Mother        s/p Pacer    Heart attack Father    Heart disease Sister  Colon cancer Neg Hx     ROS: Other than ROS in the HPI, all other systems were negative.  Exam: Temp: 98.4 F (36.9 C) Pulse Rate: 65 BP: (!) 155/73 Resp: 19 SpO2: 100 %  Visual Acuity:  near   OD 20/30   OS 20/40     OD OS  Confr Vis Fields Limited on left Limited on left  EOM (Primary) Full, no pain with eye movement Full, no pain with eye movement  Lids/Lashes Normal Normal  Conjunctiva White, quiet White, quiet  Adnexa  Normal Normal  Pupils  4 -->2, brisk, no rAPD 4 -->2, brisk no rAPD  Cornea  Clear Clear  Anterior Chamber Formed, grossly quiet Formed, grossly quiet  Lens:  Nuclear sclerosis Nuclear sclerosis  IOP (tonopen) 17 19  Fundus - Dilated? NO - neuro checks - but great view without dilation drops   Optic Disc - C:D Ratio 0.4, pink sharp rim 0.4, pink sharp rim  Post Seg:  Retina                    Vessels Normal caliber Normal caliber                  Vitreous  Clear Clear                  Macula Normal Normal                  Periphery Normal to midperiphery Normal to midperiphery       Neuro:  Oriented to person, place, and time:  Yes Psychiatric:  Mood and Affect Appropriate:  Yes  Labs/imaging:   A/P:  75 y.o. female with  headache and painful vision loss:  1) Rule out GCA - Seems very unlikely (VA is now normal, pupils are reactive without rAPD, and nerves are pink and sharp), but needs to be ruled out. Will follow ESR/CRP and recommend starting high-dose steroids if markedly elevated. Would then need temporal artery biopsy within 2 weeks of starting steroids. Please feel free to notify me if there are concerns about the lab results. To my knowledge, there are no hard fast cutoffs to guide management. I would consider them positive if elevated given the headache, the pain, and the risk of missing the diagnosis. - ESR 26, CRP 0.6 mg/dL - this is not consistent with GCA.  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 Recommend outpatient formal perimetry  3) Cataracts - Likely not visually significant.  I am happy to see the patient in my office upon discharge. I explained that given her age, field loss, and CVA history she should establish with an ophthalmologist.  Doran Stabler, MD [phone]    R Wyatt Portela, MD 08/15/2021, 8:44 PM

## 2021-08-15 NOTE — ED Provider Notes (Signed)
  Physical Exam  BP (!) 168/70   Pulse 63   Temp 98.8 F (37.1 C) (Oral)   Resp 15   SpO2 100%   Physical Exam Vitals and nursing note reviewed.  Constitutional:      General: She is not in acute distress.    Appearance: Normal appearance. She is well-developed.  HENT:     Head: Normocephalic and atraumatic.     Right Ear: External ear normal.     Left Ear: External ear normal.     Nose: Nose normal. No congestion or rhinorrhea.     Mouth/Throat:     Mouth: Mucous membranes are moist.  Eyes:     Extraocular Movements: Extraocular movements intact.     Conjunctiva/sclera: Conjunctivae normal.     Pupils: Pupils are equal, round, and reactive to light.  Cardiovascular:     Rate and Rhythm: Normal rate and regular rhythm.     Pulses: Normal pulses.     Heart sounds: No murmur heard. Pulmonary:     Effort: Pulmonary effort is normal. No respiratory distress.     Breath sounds: Normal breath sounds. No wheezing, rhonchi or rales.  Abdominal:     General: Abdomen is flat. Bowel sounds are normal.     Palpations: Abdomen is soft.     Tenderness: There is no abdominal tenderness. There is no guarding or rebound.  Musculoskeletal:        General: No swelling, tenderness or deformity.     Cervical back: Normal range of motion and neck supple. No rigidity.  Skin:    General: Skin is warm and dry.     Capillary Refill: Capillary refill takes less than 2 seconds.  Neurological:     General: No focal deficit present.     Mental Status: She is alert and oriented to person, place, and time.     Cranial Nerves: No cranial nerve deficit or facial asymmetry.     Motor: Weakness (L sided) present.  Psychiatric:        Mood and Affect: Mood normal.    ED Course/Procedures     Procedures  MDM   75 y/o female PMHx sig for hemorrhagic stroke 6 months ago, residual L sided deficits Presented w/ L blurred vision, increased L upper and lower extremity weakness x 2 days CT noncon and  CTA head and neck unremarkable  MRI showed no acute intracranial abnormality. Discussed with neurology team.  No evidence of new stroke.  Likely recrudescence of prior stroke symptoms.  She has no infectious symptoms.  COVID and flu negative.  UA showed no signs of acute infection.  Metabolic work-up reassuring.  UDS is positive for opiates and benzos.  Likely contributing to recrudescence of symptoms. Discussed patient and family.  She has persistent headache and left-sided weakness.  She is having difficulty standing and ambulating.  At this time, will recommend admission for further monitoring and PT OT assessment. Medicine team contacted for admission. Hand off given   Idamae Lusher, MD 08/15/21 1742    Margette Fast, MD 08/15/21 2007

## 2021-08-15 NOTE — ED Notes (Signed)
Transport at bedside; ophthalmologist at bedside

## 2021-08-15 NOTE — H&P (Addendum)
Date: 08/15/2021               Patient Name:  Tonya [name] MRN: [id number]  DOB: [birth year] Age / Sex: 75 y.o., female   PCP: Lin Landsman, MD         Medical Service: Internal Medicine Teaching Service         Attending Physician: Dr. Evette Doffing, Mallie Mussel, *    First Contact: Scarlett Presto, MD Pager: AD [phone]  Second Contact: Iona Beard, MD Pager: Governor Rooks [phone]       After Hours (After 5p/  First Contact Pager: [phone]  weekends / holidays): Second Contact Pager: [phone]   SUBJECTIVE   Chief Complaint: Dizziness and vision loss  History of Present Illness:  Tonya [name] is a 75 y.o. F. 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 Her prior deficits from her stroke include residual left sided weakness and loss of peripheral vision in the left eye. She is coming in with a 24 hour history of dizziness, balance issues, and a headache. She said that she had a headache yesterday while at beach and later that day she woke up and could not see out of her left eye. She describes only being able to see shadows. Endorses balance issues causing stumbling and inability to walk due to this. Prior to this event she was in her normal state of health and was working well PT/OT.  The headache is still present today, she describes it as a constant throbbing pain that feels like someone is taking an ice pick behind her left eye. She also has soreness/pain along the side of her temple, her neck and the back of her neck. She endorses a history of migraines with no aura but did have photosensitivity. She does not think this feels like a migraine.  She has a hard time describing her vision loss, but says it has gotten a little bit better. She wasn't able to see the clock when she first got into her room but now she can appreciate that it is there but is not able to read the  numbers. She denies pain upon moving her eyes but says that it just feels like a lot of pressure.   Denies urinary symptoms, fever chills, cough, sore throat or skin changes, night sweats, weight loss, or n/v/d. No sensory changes, LOC, seizure like movements, speech changes, or facial droop. No new stressors recently and was on vacation when this started. Eating and drinking normally.    Meds:  Current Meds  Medication Sig   acetaminophen (TYLENOL) 325 MG tablet Take 1-2 tablets (325-650 mg total) by mouth every 4 (four) hours as needed for mild pain.   albuterol (VENTOLIN HFA) 108 (90 Base) MCG/ACT inhaler Inhale 2 puffs into the lungs every 6 (six) hours as needed for wheezing or shortness of breath.   alendronate (FOSAMAX) 70 MG tablet Take 70 mg by mouth every Saturday.   ALPRAZolam (XANAX) 1 MG tablet Take 1 mg by mouth at bedtime as needed for sleep.   Calcium Carbonate-Vitamin D (CALCIUM + D PO) Take 1 tablet by mouth 2 (two) times daily.   HYDROcodone-acetaminophen (NORCO) 10-325 MG tablet Take 1 tablet by mouth every 6 (six) hours as needed for moderate pain.   lidocaine (LIDODERM) 5 % Place 3 patches onto the skin daily. Apply at 8 am and remove at 8 pm  daily. (Patient taking differently: Place 3 patches onto the skin as needed. Apply at 8 am and remove at 8 pm daily.)   Menthol-Methyl Salicylate (MUSCLE RUB) 10-15 % CREA Apply 1 application topically 2 (two) times daily. (Patient taking differently: Apply 1 application topically daily as needed for muscle pain.)   omeprazole (PRILOSEC) 40 MG capsule Take 40 mg by mouth daily.   oxyCODONE-acetaminophen (PERCOCET) 10-325 MG tablet Take 1 tablet by mouth every 4 (four) hours as needed.   rosuvastatin (CRESTOR) 10 MG tablet Take 10 mg by mouth at bedtime.   History of lung cancer s/p right resection in December 2021. Sees Dr. Inda Merlin. Had chemotherapy. Finished chemo several months. Did not have radiation.  Past Medical History:   Diagnosis Date   Adenoma of left adrenal gland    Cancer ([place])    Lung   Colon polyps    hyperplastic   Complication of anesthesia    Very emotional and cries after anesthesia   GERD (gastroesophageal reflux disease)    High blood pressure 03/25/2021   Kidney stone    Liver lesion    Microhematuria    Stroke [facility])     Past Surgical History:  Procedure Laterality Date   ABDOMINAL HYSTERECTOMY     APPENDECTOMY     BRONCHIAL BIOPSY  07/18/2020   Procedure: BRONCHIAL BIOPSIES;  Surgeon: [name], DO;  Location: [place] [facility];  Service: Pulmonary;;   BRONCHIAL BRUSHINGS  07/18/2020   Procedure: BRONCHIAL BRUSHINGS;  Surgeon: [name], DO;  Location: [place] [facility];  Service: Pulmonary;;   BRONCHIAL NEEDLE ASPIRATION BIOPSY  07/18/2020   Procedure: BRONCHIAL NEEDLE ASPIRATION BIOPSIES;  Surgeon: [name], DO;  Location: [place] [facility];  Service: Pulmonary;;   BRONCHIAL WASHINGS  07/18/2020   Procedure: BRONCHIAL WASHINGS;  Surgeon: [name], DO;  Location: [place] [facility];  Service: Pulmonary;;   FIDUCIAL MARKER PLACEMENT  07/18/2020   Procedure: FIDUCIAL MARKER PLACEMENT;  Surgeon: [name], DO;  Location: [place] [facility];  Service: Pulmonary;;   INTERCOSTAL NERVE BLOCK Right 08/26/2020   Procedure: INTERCOSTAL NERVE BLOCK;  Surgeon: [name], MD;  Location: [place];  Service: Thoracic;  Laterality: Right;   IR THORACENTESIS ASP PLEURAL SPACE W/IMG GUIDE  10/10/2020   NODE DISSECTION Right 08/26/2020   Procedure: NODE DISSECTION;  Surgeon: [name], MD;  Location: [place];  Service: Thoracic;  Laterality: Right;   THORACENTESIS Right 11/04/2020   Procedure: Mathews Robinsons;  Surgeon: [name], DO;  Location: [place] [facility];  Service: Pulmonary;  Laterality: Right;   THORACENTESIS Right 11/29/2020   Procedure: THORACENTESIS;  Surgeon: [name], DO;  Location[facility];  Service: Pulmonary;  Laterality: Right;   VIDEO  BRONCHOSCOPY WITH ENDOBRONCHIAL NAVIGATION N/A 07/18/2020   Procedure: VIDEO BRONCHOSCOPY WITH ENDOBRONCHIAL NAVIGATION;  Surgeon: [name], DO;  Location: [place];  Service: Pulmonary;  Laterality: N/A;   VIDEO BRONCHOSCOPY WITH ENDOBRONCHIAL ULTRASOUND  07/18/2020   Procedure: VIDEO BRONCHOSCOPY WITH ENDOBRONCHIAL ULTRASOUND;  Surgeon: [name], DO;  Location: [facility];  Service: Pulmonary;;    Social:  Lives With: Husband Occupation:retired Environmental health practitioner Support: family Level of Function: not able to drive, independent in most ADLs, no walking assist devices Substances: prior 25 year pack history of cigarettes, occasional alcohol use  Family History:  No family history of autoimmune disease, stroke, cancer, or migraines Sister with breast cancer Grandmother with leukemia  Allergies: Allergies as of 08/15/2021 - Review Complete 08/15/2021  Allergen  Reaction Noted   Other Anaphylaxis 11/19/2020   Penicillins Hives 07/30/2008   Tizanidine  03/18/2021    Review of Systems: A complete ROS was negative except as per HPI.   OBJECTIVE:   Physical Exam: Blood pressure (!) 169/77, pulse 72, temperature 98.8 F (37.1 C), temperature source Oral, resp. rate 20, SpO2 100 %.  Constitutional: Elderly woman resting comfortably in bed, in no acute distress HENT: mucous membranes moist,tenderness to palpation along the left lateral neck, jaw, and postauricular area Eyes: conjunctiva non-erythematous, PERRL Cardiovascular: regular rate and rhythm, no m/r/g Pulmonary/Chest: normal work of breathing on room air, Coarse breath sounds on the right Abdominal: soft, non-tender, non-distended MSK: normal bulk and tone Neurological: alert & oriented, answering questions appropriately, 4/5 strength on the left upper and lower extremities, disdiadochokinesia worse on the left than the right, poor finger to nose coordination worse on the left, cranial nerves 2-12 grossly  intact, loss of vision in the left eye with sparing of the central vision Skin: warm and dry Psych: normal affect  Labs: CBC    Component Value Date/Time   WBC 5.2 08/15/2021 1220   RBC 3.64 (L) 08/15/2021 1220   HGB 12.2 08/15/2021 1228   HGB 9.9 (L) 08/07/2021 1001   HCT 36.0 08/15/2021 1228   PLT 240 08/15/2021 1220   PLT 206 08/07/2021 1001   MCV 96.7 08/15/2021 1220   MCH 31.0 08/15/2021 1220   MCHC 32.1 08/15/2021 1220   RDW 12.0 08/15/2021 1220   LYMPHSABS 0.8 08/15/2021 1220   MONOABS 0.5 08/15/2021 1220   EOSABS 0.1 08/15/2021 1220   BASOSABS 0.0 08/15/2021 1220     CMP     Component Value Date/Time   NA 140 08/15/2021 1228   K 4.2 08/15/2021 1228   CL 108 08/15/2021 1228   CO2 22 08/15/2021 1220   GLUCOSE 103 (H) 08/15/2021 1228   BUN 14 08/15/2021 1228   CREATININE 0.90 08/15/2021 1228   CREATININE 1.03 (H) 08/07/2021 1001   CALCIUM 9.7 08/15/2021 1220   PROT 7.3 08/15/2021 1220   ALBUMIN 3.9 08/15/2021 1220   AST 14 (L) 08/15/2021 1220   AST 14 (L) 08/07/2021 1001   ALT 8 08/15/2021 1220   ALT 6 08/07/2021 1001   ALKPHOS 76 08/15/2021 1220   BILITOT 0.6 08/15/2021 1220   BILITOT 0.2 (L) 08/07/2021 1001   GFRNONAA 59 (L) 08/15/2021 1220   GFRNONAA 57 (L) 08/07/2021 1001   GFRAA >60 04/01/2020 1706    Imaging: CT ANGIO HEAD NECK W WO CM  Result Date: 08/15/2021 CLINICAL DATA:  Acute neuro deficit. Left eye vision change. Difficulty walking. History of stroke EXAM: CT ANGIOGRAPHY HEAD AND NECK TECHNIQUE: Multidetector CT imaging of the head and neck was performed using the standard protocol during bolus administration of intravenous contrast. Multiplanar CT image reconstructions and MIPs were obtained to evaluate the vascular anatomy. Carotid stenosis measurements (when applicable) are obtained utilizing NASCET criteria, using the distal internal carotid diameter as the denominator. CONTRAST:  18mL OMNIPAQUE IOHEXOL 350 MG/ML SOLN COMPARISON:  CT head  08/15/2021 FINDINGS: CT HEAD FINDINGS Brain: Chronic infarct in the right medial parietal lobe unchanged. No superimposed acute infarct, hemorrhage, mass. Ventricle size normal. Vascular: Negative for hyperdense vessel Skull: Negative Sinuses: Negative Orbits: Negative Review of the MIP images confirms the above findings CTA NECK FINDINGS Aortic arch: Standard branching. Imaged portion shows no evidence of aneurysm or dissection. No significant stenosis of the major arch vessel origins. Mild atherosclerotic disease in  the aortic arch. Right carotid system: Right carotid widely patent. Negative for atherosclerotic disease or dissection. Tortuosity of the right internal carotid artery. Left carotid system: Mild atherosclerotic disease left carotid bifurcation. Negative for stenosis. Tortuosity of the left internal carotid artery. Vertebral arteries: Both vertebral arteries patent to the basilar. Skeleton: No acute skeletal abnormality.  Multiple dental caries. Other neck: Negative for soft tissue mass or adenopathy. Mild thyromegaly without significant nodularity. Upper chest: Mild apical emphysema. 8 mm spiculated nodule left upper lobe unchanged from CT chest 02/04/2021. Review of the MIP images confirms the above findings CTA HEAD FINDINGS Anterior circulation: Cavernous carotid widely patent bilaterally. Anterior and middle cerebral arteries are widely patent bilaterally. Negative for large vessel occlusion. Posterior circulation: Both vertebral arteries patent to the basilar. PICA patent bilaterally. Basilar widely patent. Posterior cerebral arteries widely patent bilaterally. Fetal origin of the right posterior cerebral artery. Venous sinuses: Normal venous enhancement Anatomic variants: None Review of the MIP images confirms the above findings IMPRESSION: 1. Negative for intracranial stenosis or large vessel occlusion 2. No significant carotid or vertebral artery stenosis in the neck. 3. CT head demonstrates no  acute abnormality. Chronic infarct right parietal lobe 4. 8 mm spiculated nodule left upper lobe, unchanged from prior chest CT 02/04/2021 Electronically Signed   By: Franchot Gallo M.D.   On: 08/15/2021 14:48   CT HEAD WO CONTRAST  Result Date: 08/15/2021 CLINICAL DATA:  Acute neuro deficit.  History of stroke. EXAM: CT HEAD WITHOUT CONTRAST TECHNIQUE: Contiguous axial images were obtained from the base of the skull through the vertex without intravenous contrast. COMPARISON:  CT head 03/02/2021.  MRI head 07/04/2021 FINDINGS: Brain: Chronic encephalomalacia right medial parietal lobe unchanged from prior MRI. Negative for acute infarct, hemorrhage, mass. Ventricle size normal. Vascular: Negative for hyperdense vessel Skull: Negative Sinuses/Orbits: Paranasal sinuses clear.  Negative orbit Other: None IMPRESSION: No acute abnormality.  Chronic infarct right parietal lobe. Electronically Signed   By: Franchot Gallo M.D.   On: 08/15/2021 12:45   MR BRAIN WO CONTRAST  Result Date: 08/15/2021 CLINICAL DATA:  Neuro deficit, acute, stroke suspected. Additional history provided: Left eye vision changes, difficulty walking/grasping. 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 Weakness has become worse. EXAM: MRI HEAD WITHOUT CONTRAST TECHNIQUE: Multiplanar, multiecho pulse sequences of the brain and surrounding structures were obtained without intravenous contrast. COMPARISON:  Non-contrast head CT and CT angiogram head/neck performed earlier today 08/15/2021. Brain MRI 07/04/2021. FINDINGS: Brain: Redemonstrated chronic cortical/subcortical infarct within the right parietal lobe with associated chronic hemosiderin deposition. Subtle ex vacuo dilatation of the right lateral ventricle. Background cerebral volume is normal for age. There is no acute infarct. No evidence of an intracranial mass. No chronic intracranial blood products. No extra-axial fluid collection. No  midline shift. Vascular: Maintained flow voids within the proximal large arterial vessels. Skull and upper cervical spine: No focal suspicious marrow lesion. Sinuses/Orbits: Visualized orbits show no acute finding. No significant paranasal sinus disease. Other: Right mastoid effusion. IMPRESSION: No evidence of acute intracranial abnormality. Redemonstrated chronic cortical/subcortical infarct within the right parietal lobe, with associated chronic blood products. Otherwise unremarkable non-contrast MRI appearance of the brain. Right mastoid effusion. Electronically Signed   By: Kellie Simmering D.O.   On: 08/15/2021 15:39    EKG: personally reviewed my interpretation is sinus rhythm   ASSESSMENT & PLAN:    Assessment & Plan by Problem:  Tonya [name] is a 75 y.o. 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  #One sided Vision Loss; new neurologic deficits  #Hx of intracranial hemorrhage Patient has a history of prior intracranial hemorrhage in June of 2022 and came in with focal neurologic deficits concerning for new CVA vs recrudescence of prior hemorrhage. Per chart review prior deficits included balance issues, visual disturbances, cognitive impairment, as well as left sided weakness but there is not a lot of detail on how much these deficits have improved since prior. Coordination and disdiadochokinesia seem definitely worse today than prior but muscle strength seems to be at baseline. Patient has documented vision loss in her left eye after the prior hemorrhage but patient and family say this is worse than before. Patient has significant vision loss on the left eye with sparing of the central vision in the setting of headache, left sided neck, and face pain as well as  hypertension. MRI was negative for acute stroke on admission. The current symptoms raise concern for possible giant cell arteritis given the one sided vision loss, temporal sided pain, and new headache, however this would not explain all of the neurologic findings. Given the acuity of GCA will consult with ophthalmology and order ESR CRP in the meantime. - f/u ESR CRP; consider steroids pending results - ophthalmology consult - frequent neuro checks - tylenol for headache  HTN Patient has previously been treated with lisinopril after her CVA, however this was discontinued prior to this admission due to dizziness with lower blood pressures. Patient also says that she has been normotensive lately and is surprised to have her blood pressures be so high   #Stage 2 adenocarcinoma of the right lung s/p RUL lobectomy and chemotherapy #Emphysema Had a RUL resection and chemotherapy which finished in May 2022. Most recent CT in Dec 2022 showed a stable spiculated mass in left apical lung. - daily CBC - daily bmp  #Sciatica Patient normally takes opioids for this at home and was also requesting gabapentin. Given current neurologic deficits holding off on centrally acting medications at this time but will add back on as able.  #Anxiety - holding home benzo as above  #Aortic atherosclerosis -seen on CT  #Anemia Minorly anemic on admission to about 11.4. No active signs of bleeding - daily CBC   Diet: Normal VTE: Enoxaparin IVF: None,None Code: Full  Dispo: Admit patient to Inpatient with expected length of stay greater than 2 midnights.  Signed: Scarlett Presto, MD Internal Medicine Resident PGY-1 Pager: [phone]  08/15/2021, 6:26 PM

## 2021-08-16 DIAGNOSIS — H538 Other visual disturbances: Secondary | ICD-10-CM | POA: Diagnosis present

## 2021-08-16 DIAGNOSIS — G43909 Migraine, unspecified, not intractable, without status migrainosus: Secondary | ICD-10-CM | POA: Diagnosis present

## 2021-08-16 LAB — CBC
HCT: 33.3 % — ABNORMAL LOW (ref 36.0–46.0)
Hemoglobin: 10.9 g/dL — ABNORMAL LOW (ref 12.0–15.0)
MCH: 31.2 pg (ref 26.0–34.0)
MCHC: 32.7 g/dL (ref 30.0–36.0)
MCV: 95.4 fL (ref 80.0–100.0)
Platelets: 227 10*3/uL (ref 150–400)
RBC: 3.49 MIL/uL — ABNORMAL LOW (ref 3.87–5.11)
RDW: 12.1 % (ref 11.5–15.5)
WBC: 5.6 10*3/uL (ref 4.0–10.5)
nRBC: 0 % (ref 0.0–0.2)

## 2021-08-16 LAB — BASIC METABOLIC PANEL
Anion gap: 10 (ref 5–15)
BUN: 11 mg/dL (ref 8–23)
CO2: 20 mmol/L — ABNORMAL LOW (ref 22–32)
Calcium: 9.2 mg/dL (ref 8.9–10.3)
Chloride: 107 mmol/L (ref 98–111)
Creatinine, Ser: 0.93 mg/dL (ref 0.44–1.00)
GFR, Estimated: >60 mL/min (ref >60)
Glucose, Bld: 98 mg/dL (ref 70–99)
Potassium: 4.1 mmol/L (ref 3.5–5.1)
Sodium: 137 mmol/L (ref 135–145)

## 2021-08-16 LAB — GLUCOSE, CAPILLARY
Glucose-Capillary: 111 mg/dL — ABNORMAL HIGH (ref 70–99)
Glucose-Capillary: 105 mg/dL — ABNORMAL HIGH (ref 70–99)
Glucose-Capillary: 106 mg/dL — ABNORMAL HIGH (ref 70–99)
Glucose-Capillary: 108 mg/dL — ABNORMAL HIGH (ref 70–99)

## 2021-08-16 MED ORDER — GABAPENTIN 600 MG PO TABS
300.0000 mg | ORAL_TABLET | Freq: Every day | ORAL | Status: DC
  Administered 2021-08-16: 300 mg via ORAL
  Filled 2021-08-16: qty 1

## 2021-08-16 MED ORDER — ALPRAZOLAM 0.5 MG PO TABS
0.5000 mg | ORAL_TABLET | Freq: Every evening | ORAL | Status: DC | PRN
  Administered 2021-08-16: 0.5 mg via ORAL
  Filled 2021-08-16: qty 1

## 2021-08-16 MED ORDER — SODIUM CHLORIDE 0.9 % IV SOLN: 500.0000 mg | Freq: Every day | INTRAVENOUS | Status: DC

## 2021-08-16 MED ORDER — SODIUM CHLORIDE 0.9 % IV SOLN
500.0000 mg | Freq: Every day | INTRAVENOUS | Status: DC
  Filled 2021-08-16: qty 8

## 2021-08-16 MED ORDER — KETOROLAC TROMETHAMINE 15 MG/ML IJ SOLN
15.0000 mg | Freq: Four times a day (QID) | INTRAMUSCULAR | Status: DC
  Administered 2021-08-16 – 2021-08-17 (×4): 15 mg via INTRAVENOUS
  Filled 2021-08-16 (×5): qty 1

## 2021-08-16 MED ORDER — PROMETHAZINE HCL 12.5 MG PO TABS
12.5000 mg | ORAL_TABLET | Freq: Four times a day (QID) | ORAL | Status: DC | PRN
  Filled 2021-08-16: qty 1

## 2021-08-16 NOTE — Care Management Obs Status (Signed)
[place] NOTIFICATION   Patient Details  Name: Tonya [name] MRN: [id number] Date of Birth: 04/22/1946   Medicare Observation Status Notification Given:  Yes    Carles Collet, RN 08/16/2021, 10:31 AM

## 2021-08-16 NOTE — Progress Notes (Addendum)
Subjective:   Patient reports that she feels her vision is much improved, but she is now having sensation of her eyes jumping since she woke up this morning. She also states that she continues to have a headache but the headache is primarily in her occiput and her temples. She has had no nausea or vomiting. She is able to drink and eat without any difficulty.   Objective:  Vital signs in last 24 hours: Vitals:   08/15/21 2054 08/15/21 2255 08/16/21 0354 08/16/21 0833  BP: (!) 142/69 134/79 134/78 (!) 170/76  Pulse: 67 71 66 76  Resp:  $Remo'16 20 20  'bpOkk$ Temp:  98.4 F (36.9 C) 98.7 F (37.1 C)   TempSrc:  Oral Oral   SpO2: 99% 98% 100% 99%   Constitutional: Elderly woman resting comfortably in bed, in no acute distress HENT: mucous membranes moist Eyes: conjunctiva non-erythematous, non icteric, PERRL Cardiovascular: regular rate and rhythm, no m/r/g Pulmonary/Chest: normal work of breathing on room air, regular  Abdominal: soft, non-tender, non-distended MSK: normal bulk and tone Neurological: alert & oriented, answering questions appropriately, 5/5 strength on the left upper and lower extremities, finger to nose coordination improved, cranial nerves 2-12 grossly intact, vz acuity nl w/ eyewear (able to read sign at bedside)  Skin: warm and dry Psych: normal affect  Assessment/Plan:  Principal Problem:   Blurred vision  Tonya [name] is a 75 y.o. 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   #One sided Vision Loss; new neurologic deficits  #Hx of intracranial hemorrhage Initially concern for new CVA however patient's MR head was negative for acute infarct. GCA was also considered however patient was noted to have  normal CRP and minimally elevated ESR. In addition she was evaluated by ophthalmology who were not concerned about GCA but did note that patient had L homonymous hemianopsia. Given stroke and primary ocular pathology have been ruled out, patient's symptoms could be due to recrudescence from her previous stroke. Given her headache in the setting of her other neurological symptoms, these could all be mediated by a migraine as well.  -Toradol $RemoveB'15mg'naphKzoP$  q6h for 2 day -Patient will need ophtho outpatient set up  -Likely home once headache improves  #HTN Patient has previously been treated with lisinopril after her CVA, however this was discontinued prior to this admission due to dizziness in the setting of lower blood pressures. Patient says that she has been normotensive lately and is surprised to have her blood pressures so elevated. SBP up to 170s this hospitalization.  -Will continue to monitor her blood pressures, patient will likely need close follow up with her PCP to discuss resumption of her antihypertensives.   #Stage 2 adenocarcinoma RUL s/p RU lobectomy and chemotherapy #Emphysema Had a RU lobectomy and chemotherapy which finished in May 2022. Most recent CT in Dec 2022 showed a stable spiculated mass in left apical lung. - daily CBC - daily bmp  #Sciatica Patient normally takes opioids for this at home and was also requesting gabapentin.  -Will resume her home gabapentin   #Anxiety - Will resume home benzo dose at half   #Aortic atherosclerosis -seen on CT   #Anemia Minorly anemic on admission to about 11.4. No active signs of bleeding - daily CBC  Diet: Normal VTE: Enoxaparin IVF: None Code: Full   Dispo: Anticipated discharge in approximately 1 day(s).   Rick Duff, MD 08/16/2021, 11:24 AM After 5pm on weekdays and 1pm on weekends: On Call pager [phone]

## 2021-08-16 NOTE — Care Management CC44 (Signed)
Condition Code 44 Documentation Completed  Patient Details  Name: Tonya [name] MRN: [id number] Date of Birth: 04-13-1946   Condition Code 44 given:  Yes Patient signature on Condition Code 44 notice:  Yes Documentation of 2 MD's agreement:  Yes Code 44 added to claim:  Yes    Carles Collet, RN 08/16/2021, 10:32 AM

## 2021-08-16 NOTE — Plan of Care (Signed)
Pt is alert oriented x 4. Ambulatory, +1 assistance. Pt c/o headache PRN tylenol was given in the ED, not effective. Provider paged to inform.Received order for ibuprofen. Pt is ambulatory, pt had bowel movement. Pt has moments where she is restless and anxious regarding the position of her covers, her gown, and concerns about her vision. On arrival pt stated her vision was blurry. Around 6:45 pt complaining her vision-the image was moving/ jumping. Pt is concerned. Message sent to providers.    Problem: Education: Goal: Knowledge of General Education information will improve Description: Including pain rating scale, medication(s)/side effects and non-pharmacologic comfort measures Outcome: Progressing   Problem: Health Behavior/Discharge Planning: Goal: Ability to manage health-related needs will improve Outcome: Progressing   Problem: Clinical Measurements: Goal: Ability to maintain clinical measurements within normal limits will improve Outcome: Progressing Goal: Will remain free from infection Outcome: Progressing Goal: Diagnostic test results will improve Outcome: Progressing Goal: Respiratory complications will improve Outcome: Progressing Goal: Cardiovascular complication will be avoided Outcome: Progressing   Problem: Activity: Goal: Risk for activity intolerance will decrease Outcome: Progressing   Problem: Nutrition: Goal: Adequate nutrition will be maintained Outcome: Progressing   Problem: Coping: Goal: Level of anxiety will decrease Outcome: Progressing   Problem: Elimination: Goal: Will not experience complications related to bowel motility Outcome: Progressing Goal: Will not experience complications related to urinary retention Outcome: Progressing   Problem: Pain Managment: Goal: General experience of comfort will improve Outcome: Progressing   Problem: Safety: Goal: Ability to remain free from injury will improve Outcome: Progressing   Problem: Skin  Integrity: Goal: Risk for impaired skin integrity will decrease Outcome: Progressing

## 2021-08-16 NOTE — Evaluation (Signed)
Physical Therapy Evaluation Patient Details Name: GLORIMAR STROOPE MRN: [id number] DOB: December 12, 1945 Today's Date: 08/16/2021  History of Present Illness  75 y.o. F admitted on 12/9 due to blurred vision and decreased balance. MRI is negative.  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  Clinical Impression   Pt admitted secondary to problem above with deficits below. PTA patient was independent with walking and reports she was not having difficulty with left peripheral vision. During assessment, she ran into a keyboard on her left side and reports she could not see it coming. She had staggering bouts of imbalance when statically standing, however when walking she did not have these episodes. She did self-select a very slow pace, and was able to increase when instructed. She scored 44/56 on Berg Balance Assessment, losing all points due to need for supervision during task completion. Berg Balance Assessment focuses on static postures more than dynamic. Overall, feel she can benefit from OPPT upon discharge. Anticipate patient will benefit from PT to address problems listed below.Will continue to follow acutely to maximize functional mobility independence and safety.          Recommendations for follow up therapy are one component of a multi-disciplinary discharge planning process, led by the attending physician.  Recommendations may be updated based on patient status, additional functional criteria and insurance authorization.  Follow Up Recommendations Outpatient PT    Assistance Recommended at Discharge Intermittent Supervision/Assistance  Functional Status Assessment Patient has had a recent decline in their functional status and demonstrates the ability to make significant improvements in function in a reasonable and predictable amount of time.  Equipment Recommendations  None  recommended by PT (pt refuses use of assistive device)    Recommendations for Other Services       Precautions / Restrictions Precautions Precautions: Fall Restrictions Weight Bearing Restrictions: No      Mobility  Bed Mobility Overal bed mobility: Modified Independent             General bed mobility comments: Increased time    Transfers Overall transfer level: Needs assistance Equipment used: None Transfers: Sit to/from Stand Sit to Stand: Min guard           General transfer comment: Min g for safety as pt appears unsteady    Ambulation/Gait Ambulation/Gait assistance: Min guard Gait Distance (Feet): 180 Feet Assistive device: None Gait Pattern/deviations: Step-through pattern;Drifts right/left       General Gait Details: nearly walked into chair on her left; later walked into keyboard on her left. Able to state she needs to scan to her left without cues.  Stairs            Wheelchair Mobility    Modified Rankin (Stroke Patients Only)       Balance Overall balance assessment: Mild deficits observed, not formally tested                               Standardized Balance Assessment Standardized Balance Assessment : Berg Balance Test Berg Balance Test Sit to Stand: Able to stand without using hands and stabilize independently Standing Unsupported: Able to stand 2 minutes with supervision Sitting with Back Unsupported but Feet Supported on Floor or Stool: Able to sit safely and securely 2 minutes Stand to Sit: Sits safely with minimal use of hands Transfers: Able to transfer  safely, minor use of hands Standing Unsupported with Eyes Closed: Able to stand 10 seconds safely Standing Ubsupported with Feet Together: Able to place feet together independently and stand for 1 minute with supervision From Standing, Reach Forward with Outstretched Arm: Can reach forward >12 cm safely (5") From Standing Position, Pick up Object from Floor:  Able to pick up shoe, needs supervision From Standing Position, Turn to Look Behind Over each Shoulder: Looks behind one side only/other side shows less weight shift Turn 360 Degrees: Able to turn 360 degrees safely but slowly Standing Unsupported, Alternately Place Feet on Step/Stool: Able to complete 4 steps without aid or supervision Standing Unsupported, One Foot in Front: Able to plae foot ahead of the other independently and hold 30 seconds Standing on One Leg: Able to lift leg independently and hold equal to or more than 3 seconds Total Score: 44         Pertinent Vitals/Pain Pain Assessment: Faces Pain Score: 8  Faces Pain Scale: Hurts little more Pain Location: Left upper arm (afte running into keyboard) Pain Descriptors / Indicators: Discomfort Pain Intervention(s): Limited activity within patient's tolerance;Monitored during session    Home Living Family/patient expects to be discharged to:: Private residence Living Arrangements: Spouse/significant other Available Help at Discharge: Family;Available 24 hours/day Type of Home: House Home Access: Stairs to enter Entrance Stairs-Rails: Right;Left;Can reach both Entrance Stairs-Number of Steps: 3   Home Layout: One level Home Equipment: Grab bars - tub/shower;Shower seat;Hand held shower head      Prior Function Prior Level of Function : Independent/Modified Independent             Mobility Comments: Using no DME, recently finished OP Therapies ADLs Comments: Indep     Hand Dominance   Dominant Hand: Right    Extremity/Trunk Assessment   Upper Extremity Assessment Upper Extremity Assessment: Defer to OT evaluation    Lower Extremity Assessment Lower Extremity Assessment: Overall WFL for tasks assessed    Cervical / Trunk Assessment Cervical / Trunk Assessment: Normal  Communication   Communication: No difficulties  Cognition Arousal/Alertness: Awake/alert Behavior During Therapy: Impulsive Overall  Cognitive Status: Impaired/Different from baseline Area of Impairment: Safety/judgement                       Following Commands: Follows one step commands with increased time Safety/Judgement: Decreased awareness of safety;Decreased awareness of deficits     General Comments: reports she had decr left peripheral vision after ICH in June, however it ha\d improved and now is worse again. She could state how she should scan to her left, yet did not do this and ran into a keyboard on her left        General Comments General comments (skin integrity, edema, etc.): BP taken after dizzy episode - 135/73, dizziness quickly passes.    Exercises     Assessment/Plan    PT Assessment Patient needs continued PT services  PT Problem List Decreased balance;Decreased mobility;Decreased cognition;Decreased knowledge of use of DME;Decreased safety awareness       PT Treatment Interventions DME instruction;Gait training;Functional mobility training;Therapeutic activities;Therapeutic exercise;Balance training;Cognitive remediation;Patient/family education    PT Goals (Current goals can be found in the Care Plan section)  Acute Rehab PT Goals Patient Stated Goal: to improve her balance PT Goal Formulation: With patient Time For Goal Achievement: 08/30/21 Potential to Achieve Goals: Good    Frequency Min 3X/week   Barriers to discharge  Co-evaluation               AM-PAC PT "6 Clicks" Mobility  Outcome Measure Help needed turning from your back to your side while in a flat bed without using bedrails?: None Help needed moving from lying on your back to sitting on the side of a flat bed without using bedrails?: None Help needed moving to and from a bed to a chair (including a wheelchair)?: A Little Help needed standing up from a chair using your arms (e.g., wheelchair or bedside chair)?: A Little Help needed to walk in hospital room?: A Little Help needed climbing 3-5  steps with a railing? : A Little 6 Click Score: 20    End of Session Equipment Utilized During Treatment: Gait belt Activity Tolerance: Patient tolerated treatment well;No increased pain Patient left: in chair;with call bell/phone within reach;with family/visitor present Nurse Communication: Mobility status;Other (comment) (up in chair with call bell) PT Visit Diagnosis: Unsteadiness on feet (R26.81);Difficulty in walking, not elsewhere classified (R26.2)    Time: 2158-7276 PT Time Calculation (min) (ACUTE ONLY): 24 min   Charges:   PT Evaluation $PT Eval Moderate Complexity: [place], PT Acute Rehabilitation Services  Pager [phone] Office [phone]   Rexanne Mano 08/16/2021, 1:06 PM

## 2021-08-16 NOTE — Evaluation (Signed)
Occupational Therapy Evaluation Patient Details Name: Tonya [name] MRN: [id number] DOB: [birth year] Today's Date: 08/16/2021   History of Present Illness 75 y.o. F admitted on 12/9 due to blurred vision and decreased balance. MRI is negative.  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   Clinical Impression   Pt admitted for concerns listed above. PTA pt reported that she was independent with all ADL's and had just recently finished OP therapies. At this time, pt presents with visual deficits, decreased balance, impulsivity, and mild cognitive concerns.  Pt requiring min guard for all OOB mobility and tasks and experienced 2 mild LoB where she required minimal hands on support to steady. Recommending Out Patient OT upon discharge and OT will follow acutely.      Recommendations for follow up therapy are one component of a multi-disciplinary discharge planning process, led by the attending physician.  Recommendations may be updated based on patient status, additional functional criteria and insurance authorization.   Follow Up Recommendations  Outpatient OT    Assistance Recommended at Discharge Set up Supervision/Assistance  Functional Status Assessment  Patient has had a recent decline in their functional status and demonstrates the ability to make significant improvements in function in a reasonable and predictable amount of time.  Equipment Recommendations  None recommended by OT    Recommendations for Other Services       Precautions / Restrictions Precautions Precautions: Fall Restrictions Weight Bearing Restrictions: No      Mobility Bed Mobility Overal bed mobility: Modified Independent             General bed mobility comments: Increased time    Transfers Overall transfer level: Needs assistance Equipment used: None Transfers: Sit to/from  Stand Sit to Stand: Min guard           General transfer comment: Min g for safety      Balance Overall balance assessment: Mild deficits observed, not formally tested                                         ADL either performed or assessed with clinical judgement   ADL Overall ADL's : Needs assistance/impaired Eating/Feeding: Supervision/ safety   Grooming: Supervision/safety;Standing   Upper Body Bathing: Supervision/ safety;Sitting   Lower Body Bathing: Min guard;Sitting/lateral leans;Sit to/from stand   Upper Body Dressing : Supervision/safety;Sitting   Lower Body Dressing: Min guard;Sitting/lateral leans;Sit to/from stand   Toilet Transfer: Min guard;Ambulation   Toileting- Clothing Manipulation and Hygiene: Min guard;Sitting/lateral lean;Sit to/from stand       Functional mobility during ADLs: Min guard General ADL Comments: Pt with 2 LoB when ambulating to and from bathroom, both times correlate with dizzness. All standing ADL's requiring min guard due to balance deficits.     Vision Baseline Vision/History: 1 Wears glasses Ability to See in Adequate Light: 1 Impaired Patient Visual Report: Blurring of vision;Eye fatigue/eye pain/headache Vision Assessment?: Yes;Vision impaired- to be further tested in functional context Eye Alignment: Within Functional Limits Ocular Range of Motion: Within Functional Limits Alignment/Gaze Preference: Within Defined Limits Tracking/Visual Pursuits: Able to track stimulus in all quads without difficulty Saccades: Within functional limits Convergence: Within functional limits Visual Fields: Left visual field deficit (mild peripheral loss) Additional Comments: Pt reporting that it just doesn't feel right, she gets  dizzy intermittently     Perception     Praxis      Pertinent Vitals/Pain Pain Assessment: 0-10 Pain Score: 8  Pain Location: R hip and Leg Pain Descriptors / Indicators:  Aching;Discomfort;Grimacing Pain Intervention(s): Monitored during session;Repositioned     Hand Dominance Right   Extremity/Trunk Assessment Upper Extremity Assessment Upper Extremity Assessment: Overall WFL for tasks assessed   Lower Extremity Assessment Lower Extremity Assessment: Defer to PT evaluation   Cervical / Trunk Assessment Cervical / Trunk Assessment: Normal   Communication Communication Communication: No difficulties   Cognition Arousal/Alertness: Awake/alert Behavior During Therapy: Impulsive Overall Cognitive Status: Impaired/Different from baseline Area of Impairment: Following commands;Safety/judgement                       Following Commands: Follows one step commands with increased time Safety/Judgement: Decreased awareness of safety;Decreased awareness of deficits     General Comments: Pt impulsive and coming to stand and walk despite OT asking her to wait. Pt requiring step by step instruction for commands.     General Comments  BP taken after dizzy episode - 135/73, dizziness quickly passes.    Exercises     Shoulder Instructions      Home Living Family/patient expects to be discharged to:: Private residence Living Arrangements: Spouse/significant other Available Help at Discharge: Family;Available 24 hours/day Type of Home: House Home Access: Stairs to enter [employer] of Steps: 3 Entrance Stairs-Rails: Right;Left;Can reach both Home Layout: One level     Bathroom Shower/Tub: Occupational psychologist: Standard Bathroom Accessibility: Yes   Home Equipment: Grab bars - tub/shower;Shower seat;Hand held shower head          Prior Functioning/Environment Prior Level of Function : Independent/Modified Independent             Mobility Comments: Using no DME, recently finished OP Therapies ADLs Comments: Indep        OT Problem List: Decreased activity tolerance;Impaired balance (sitting and/or  standing);Impaired vision/perception;Decreased coordination;Decreased safety awareness      OT Treatment/Interventions: Self-care/ADL training;Therapeutic exercise;Energy conservation;DME and/or AE instruction;Therapeutic activities;Visual/perceptual remediation/compensation;Patient/family education;Balance training    OT Goals(Current goals can be found in the care plan section) Acute Rehab OT Goals Patient Stated Goal: to feel more stable and safe OT Goal Formulation: With patient Time For Goal Achievement: 08/30/21 Potential to Achieve Goals: Good ADL Goals Pt Will Perform Lower Body Bathing: with modified independence;sitting/lateral leans;sit to/from stand Pt Will Perform Lower Body Dressing: with modified independence;sitting/lateral leans;sit to/from stand Pt Will Transfer to Toilet: with modified independence;ambulating Pt Will Perform Toileting - Clothing Manipulation and hygiene: with modified independence;sitting/lateral leans;sit to/from stand Additional ADL Goal #1: PT will follow multistep commands with no verbal cuing 100% of the session. Additional ADL Goal #2: Pt will complete a medication managment task with no errors.  OT Frequency: Min 2X/week   Barriers to D/C:            Co-evaluation              AM-PAC OT "6 Clicks" Daily Activity     Outcome Measure Help from another person eating meals?: None Help from another person taking care of personal grooming?: A Little Help from another person toileting, which includes using toliet, bedpan, or urinal?: A Little Help from another person bathing (including washing, rinsing, drying)?: A Little Help from another person to put on and taking off regular upper body clothing?: A Little Help from another person  to put on and taking off regular lower body clothing?: A Little 6 Click Score: 19   End of Session Equipment Utilized During Treatment: Gait belt Nurse Communication: Mobility status  Activity Tolerance:  Patient tolerated treatment well Patient left: in bed;with call bell/phone within reach;with bed alarm set;with family/visitor present  OT Visit Diagnosis: Unsteadiness on feet (R26.81);Other abnormalities of gait and mobility (R26.89);Muscle weakness (generalized) (M62.81)                Time: 3612-2449 OT Time Calculation (min): 23 min Charges:  OT General Charges $OT Visit: 1 Visit OT Evaluation $OT Eval Moderate Complexity: 1 Mod OT Treatments $Therapeutic Activity: 8-22 mins  Nailyn Dearinger H., OTR/L Acute Rehabilitation  Etty Isaac Elane Izaih Kataoka 08/16/2021, 11:32 AM

## 2021-08-17 LAB — GLUCOSE, CAPILLARY
Glucose-Capillary: 91 mg/dL (ref 70–99)
Glucose-Capillary: 96 mg/dL (ref 70–99)
Glucose-Capillary: 96 mg/dL (ref 70–99)
Glucose-Capillary: 99 mg/dL (ref 70–99)

## 2021-08-17 NOTE — Plan of Care (Signed)
Pt is alert oriented x 4. Pt states her vision has improved. Pt was given PRN xanax and pt rested well. Pt has been ambulating with +1 assist. No distress noted.   Problem: Education: Goal: Knowledge of General Education information will improve Description: Including pain rating scale, medication(s)/side effects and non-pharmacologic comfort measures Outcome: Progressing   Problem: Health Behavior/Discharge Planning: Goal: Ability to manage health-related needs will improve Outcome: Progressing   Problem: Clinical Measurements: Goal: Ability to maintain clinical measurements within normal limits will improve Outcome: Progressing Goal: Will remain free from infection Outcome: Progressing Goal: Diagnostic test results will improve Outcome: Progressing Goal: Respiratory complications will improve Outcome: Progressing Goal: Cardiovascular complication will be avoided Outcome: Progressing   Problem: Activity: Goal: Risk for activity intolerance will decrease Outcome: Progressing   Problem: Nutrition: Goal: Adequate nutrition will be maintained Outcome: Progressing   Problem: Coping: Goal: Level of anxiety will decrease Outcome: Progressing   Problem: Elimination: Goal: Will not experience complications related to bowel motility Outcome: Progressing Goal: Will not experience complications related to urinary retention Outcome: Progressing   Problem: Pain Managment: Goal: General experience of comfort will improve Outcome: Progressing   Problem: Safety: Goal: Ability to remain free from injury will improve Outcome: Progressing   Problem: Skin Integrity: Goal: Risk for impaired skin integrity will decrease Outcome: Progressing

## 2021-08-17 NOTE — Discharge Summary (Addendum)
Name: Tonya [name] MRN: [id number] DOB: [birth year] 75 y.o. PCP: Lin Landsman, MD  Date of Admission: 08/15/2021 11:54 AM Date of Discharge: 08/17/2021 12:17 PM Attending Physician: Lalla Brothers, MD  Discharge Diagnosis: 1. Blurred vision 2. History of right parietal hemorrhage 3.  Migraine 4.  Left homonymous hemianopsia Discharge Medications: Allergies as of 08/17/2021       Reactions   Other Anaphylaxis   Spiders   Penicillins Hives   REACTION: 20 years   Tizanidine    Significant hypotension        Medication List     TAKE these medications    acetaminophen 325 MG tablet Commonly known as: TYLENOL Take 1-2 tablets (325-650 mg total) by mouth every 4 (four) hours as needed for mild pain.   albuterol 108 (90 Base) MCG/ACT inhaler Commonly known as: VENTOLIN HFA Inhale 2 puffs into the lungs every 6 (six) hours as needed for wheezing or shortness of breath.   alendronate 70 MG tablet Commonly known as: FOSAMAX Take 70 mg by mouth every Saturday.   ALPRAZolam 1 MG tablet Commonly known as: XANAX Take 1 mg by mouth at bedtime as needed for sleep.   CALCIUM + D PO Take 1 tablet by mouth 2 (two) times daily.   folic acid 1 MG tablet Commonly known as: FOLVITE TAKE 1 TABLET(1 MG) BY MOUTH DAILY   HYDROcodone-acetaminophen 10-325 MG tablet Commonly known as: NORCO Take 1 tablet by mouth every 6 (six) hours as needed for moderate pain.   lidocaine 5 % Commonly known as: LIDODERM Place 3 patches onto the skin daily. Apply at 8 am and remove at 8 pm daily. What changed:  when to take this reasons to take this   magnesium oxide 400 (241.3 Mg) MG tablet Commonly known as: MAG-OX Take 1 tablet (400 mg total) by mouth 2 (two) times daily.   Muscle Rub 10-15 % Crea Apply 1 application topically 2 (two) times daily. What changed:  when to take this reasons to take this   Narcan 4 MG/0.1ML Liqd nasal spray kit Generic drug: naloxone 1  spray once.   omeprazole 40 MG capsule Commonly known as: PRILOSEC Take 40 mg by mouth daily.   oxyCODONE-acetaminophen 5-325 MG tablet Commonly known as: PERCOCET/ROXICET Take 1-2 tablets by mouth 2 (two) times daily as needed for severe pain (severe pain).   oxyCODONE-acetaminophen 10-325 MG tablet Commonly known as: PERCOCET Take 1 tablet by mouth every 4 (four) hours as needed.   rosuvastatin 10 MG tablet Commonly known as: CRESTOR Take 10 mg by mouth at bedtime.   umeclidinium-vilanterol 62.5-25 MCG/INH Aepb Commonly known as: ANORO ELLIPTA Inhale 1 puff into the lungs daily. What changed:  when to take this reasons to take this        Disposition and follow-up:   Tonya [name] was discharged from [facility] in Stable condition.  At the hospital follow up visit please address:  1.  Headaches: Please assess assess for further headaches, may be prone to headaches structural brain changes  Left homonymous hemianopsia-likely due to prior intracranial hemorrhage will need follow-up with outpatient ophthalmology with Dr. Katy Fitch  Hypertension: Please evaluate pressures BP is elevated during admission and address as needed.  2.  Labs / imaging needed at time of follow-up: none  3.  Pending labs/ test needing follow-up: none  Follow-up Appointments:  Follow-up Information     Groat, Darlina Guys, MD. Schedule an appointment as soon as possible for  a visit in 2 week(s).   Specialty: Ophthalmology Contact information: [address] [place] [address] [place] [address] [phone]         [place] Follow up.   Specialty: Rehabilitation Why: call this week to expedite appointment. A referral has been made electronically for you Contact information: [address] [place] [facility] Course by problem list:    Tonya [name] is a 75 y.o. 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  States she woke up on 12/9 being unable to see out of her left eye.  Describes being able to see only shadows.  This slowly improved over the course of the day but continued to remain blurry at time of admission. Initially concern for new CVA however patient's MR head was negative for acute infarct.  CT head without acute hemorrhage or other acute abnormalities. CT angio negative for intracranial stenosis or large vessel occlusions.  GCA was also considered however patient was noted to have normal CRP and minimally elevated ESR. In addition she was evaluated by ophthalmology who were not concerned about GCA but did note that patient had L homonymous hemianopsia likely deficits are due to prior stroke in June 2022.  Eye pressures were within normal limits in the ED, retinal exam normal per ophthalmology.  She will have follow-up with Dr. Zenia Resides office in 1 to 2 weeks after discharge.  Given stroke and primary ocular pathology have been ruled out, patient's symptoms could be due to recrudescence from her previous stroke. Given her headache in the setting of her other neurological symptoms, her symptoms could be a result of complex migraine..  Patient does endorse history of migraine without aura in the past but has not had migraines in the past 10 years or so.  Although given her recent stroke she may be at elevated risk for headaches.  Her headache was treated  with IV Toradol and Phenergan with improvement in her pain.  Her visual acuity returned back to baseline on day of discharge.  Still continues to have some mild gait imbalance issues with PT and OT.  Patient will have outpatient PT OT referral on discharge.  #HTN Patient has previously been treated with lisinopril after her CVA, however this was discontinued prior to this admission due to dizziness in the setting of lower blood pressures. Patient says that she has been normotensive lately and is surprised to have her blood pressures so elevated. SBP up to 170s this hospitalization.  Pressures likely elevated in setting of headache.  On day of discharge systolic blood pressure improved to 140s.  She states she may return to normotension after her headaches improved.  Recommend outpatient follow-up with PCP for blood pressure monitoring.     4.5 spiculated left upper lobe nodule unchanged from prior CT in May 2022 Discharge Exam:   BP (!) 149/87 (BP Location: Left Arm)   Pulse 68   Temp 98.1 F (36.7 C) (Oral)  Resp 14   SpO2 97%  Discharge exam:  Constitutional: Appears well-developed and well-nourished. No distress.  HENT: Normocephalic and atraumatic, EOMI, conjunctiva normal, moist mucous membranes Cardiovascular: Normal rate, regular rhythm, S1 and S2 present, no murmurs, rubs, gallops.  Distal pulses intact Respiratory: No respiratory distress, no accessory muscle use.  Effort is normal.  Lungs are clear to auscultation bilaterally. GI: Nondistended, soft, nontender to palpation, normal active bowel sounds Musculoskeletal: Normal bulk and tone.  No peripheral edema noted. Neurological: Is alert and oriented x4, strength is normal, no slurred speech,, mild dysmetria on finger-to-nose worse when reaching towards her left side, gross visual acuity appears normal Skin: Warm and dry.  No rash, erythema, lesions noted. Psychiatric: Normal mood and affect. Behavior is normal. Judgment and  thought content normal.    Pertinent Labs, Studies, and Procedures:  CT ANGIO HEAD NECK W WO CM  Result Date: 08/15/2021 CLINICAL DATA:  Acute neuro deficit. Left eye vision change. Difficulty walking. History of stroke EXAM: CT ANGIOGRAPHY HEAD AND NECK TECHNIQUE: Multidetector CT imaging of the head and neck was performed using the standard protocol during bolus administration of intravenous contrast. Multiplanar CT image reconstructions and MIPs were obtained to evaluate the vascular anatomy. Carotid stenosis measurements (when applicable) are obtained utilizing NASCET criteria, using the distal internal carotid diameter as the denominator. CONTRAST:  38m OMNIPAQUE IOHEXOL 350 MG/ML SOLN COMPARISON:  CT head 08/15/2021 FINDINGS: CT HEAD FINDINGS Brain: Chronic infarct in the right medial parietal lobe unchanged. No superimposed acute infarct, hemorrhage, mass. Ventricle size normal. Vascular: Negative for hyperdense vessel Skull: Negative Sinuses: Negative Orbits: Negative Review of the MIP images confirms the above findings CTA NECK FINDINGS Aortic arch: Standard branching. Imaged portion shows no evidence of aneurysm or dissection. No significant stenosis of the major arch vessel origins. Mild atherosclerotic disease in the aortic arch. Right carotid system: Right carotid widely patent. Negative for atherosclerotic disease or dissection. Tortuosity of the right internal carotid artery. Left carotid system: Mild atherosclerotic disease left carotid bifurcation. Negative for stenosis. Tortuosity of the left internal carotid artery. Vertebral arteries: Both vertebral arteries patent to the basilar. Skeleton: No acute skeletal abnormality.  Multiple dental caries. Other neck: Negative for soft tissue mass or adenopathy. Mild thyromegaly without significant nodularity. Upper chest: Mild apical emphysema. 8 mm spiculated nodule left upper lobe unchanged from CT chest 02/04/2021. Review of the MIP images  confirms the above findings CTA HEAD FINDINGS Anterior circulation: Cavernous carotid widely patent bilaterally. Anterior and middle cerebral arteries are widely patent bilaterally. Negative for large vessel occlusion. Posterior circulation: Both vertebral arteries patent to the basilar. PICA patent bilaterally. Basilar widely patent. Posterior cerebral arteries widely patent bilaterally. Fetal origin of the right posterior cerebral artery. Venous sinuses: Normal venous enhancement Anatomic variants: None Review of the MIP images confirms the above findings IMPRESSION: 1. Negative for intracranial stenosis or large vessel occlusion 2. No significant carotid or vertebral artery stenosis in the neck. 3. CT head demonstrates no acute abnormality. Chronic infarct right parietal lobe 4. 8 mm spiculated nodule left upper lobe, unchanged from prior chest CT 02/04/2021 Electronically Signed   By: CFranchot GalloM.D.   On: 08/15/2021 14:48   CT HEAD WO CONTRAST  Result Date: 08/15/2021 CLINICAL DATA:  Acute neuro deficit.  History of stroke. EXAM: CT HEAD WITHOUT CONTRAST TECHNIQUE: Contiguous axial images were obtained from the base of the skull through the vertex without intravenous contrast. COMPARISON:  CT head 03/02/2021.  MRI head 07/04/2021 FINDINGS:  Brain: Chronic encephalomalacia right medial parietal lobe unchanged from prior MRI. Negative for acute infarct, hemorrhage, mass. Ventricle size normal. Vascular: Negative for hyperdense vessel Skull: Negative Sinuses/Orbits: Paranasal sinuses clear.  Negative orbit Other: None IMPRESSION: No acute abnormality.  Chronic infarct right parietal lobe. Electronically Signed   By: Franchot Gallo M.D.   On: 08/15/2021 12:45   MR BRAIN WO CONTRAST  Result Date: 08/15/2021 CLINICAL DATA:  Neuro deficit, acute, stroke suspected. Additional history provided: Left eye vision changes, difficulty walking/grasping. History of hemorrhagic stroke in June with left-sided weakness  from this prior event. Weakness has become worse. EXAM: MRI HEAD WITHOUT CONTRAST TECHNIQUE: Multiplanar, multiecho pulse sequences of the brain and surrounding structures were obtained without intravenous contrast. COMPARISON:  Non-contrast head CT and CT angiogram head/neck performed earlier today 08/15/2021. Brain MRI 07/04/2021. FINDINGS: Brain: Redemonstrated chronic cortical/subcortical infarct within the right parietal lobe with associated chronic hemosiderin deposition. Subtle ex vacuo dilatation of the right lateral ventricle. Background cerebral volume is normal for age. There is no acute infarct. No evidence of an intracranial mass. No chronic intracranial blood products. No extra-axial fluid collection. No midline shift. Vascular: Maintained flow voids within the proximal large arterial vessels. Skull and upper cervical spine: No focal suspicious marrow lesion. Sinuses/Orbits: Visualized orbits show no acute finding. No significant paranasal sinus disease. Other: Right mastoid effusion. IMPRESSION: No evidence of acute intracranial abnormality. Redemonstrated chronic cortical/subcortical infarct within the right parietal lobe, with associated chronic blood products. Otherwise unremarkable non-contrast MRI appearance of the brain. Right mastoid effusion. Electronically Signed   By: Kellie Simmering D.O.   On: 08/15/2021 15:39   CMP Latest Ref Rng & Units 08/16/2021 08/15/2021 08/15/2021  Glucose 70 - 99 mg/dL 98 103(H) 107(H)  BUN 8 - 23 mg/dL _0 Creatinine 0.44 - 1.00 mg/dL 0.93 0.90 1.00  Sodium 135 - 145 mmol/L 137 140 137  Potassium 3.5 - 5.1 mmol/L 4.1 4.2 4.1  Chloride 98 - 111 mmol/L 107 108 106  CO2 22 - 32 mmol/L 20(L) - 22  Calcium 8.9 - 10.3 mg/dL 9.2 - 9.7  Total Protein 6.5 - 8.1 g/dL - - 7.3  Total Bilirubin 0.3 - 1.2 mg/dL - - 0.6  Alkaline Phos 38 - 126 U/L - - 76  AST 15 - 41 U/L - - 14(L)  ALT 0 - 44 U/L - - 8   CBC Latest Ref Rng & Units 08/16/2021 08/15/2021 08/15/2021   WBC 4.0 - 10.5 K/uL 5.6 - 5.2  Hemoglobin 12.0 - 15.0 g/dL 10.9(L) 12.2 11.3(L)  Hematocrit 36.0 - 46.0 % 33.3(L) 36.0 35.2(L)  Platelets 150 - 400 K/uL 227 - 240    Discharge Instructions: Discharge Instructions     Ambulatory referral to Occupational Therapy   Complete by: As directed    Ambulatory referral to Occupational Therapy   Complete by: As directed    Ambulatory referral to Physical Therapy   Complete by: As directed    Ambulatory referral to Physical Therapy   Complete by: As directed    Call MD for:  persistant dizziness or light-headedness   Complete by: As directed    Diet - low sodium heart healthy   Complete by: As directed    Increase activity slowly   Complete by: As directed        Signed: Iona Beard, MD 08/17/2021, 12:44 PM   Pager: [phone]

## 2021-08-17 NOTE — Discharge Instructions (Signed)
Dr. [name],  You were admitted for left sided headache, with left eye pain, blurred vision, and gait difficulties. Imaging did not show a new stroke, and you eye exam showed some left visual field loss likely due to your prior stroke. We feel that your symptoms are due to you prior stroke rather than a new illness. Please continue you normal home medications. Please follow up with Dr. Katy [name] to follow up on you vision changes. You will need to continue with outpatient physical and occupational therapy, I expect this will much improve with time and rehabilitation. Please follow up with you primary care provider if you continue to have bad headaches. If you develop worsening or new symptoms of weakness, loss of coordination, slurred speech, or visual changes please return to the hospital.

## 2021-08-17 NOTE — TOC Transition Note (Signed)
Transition of Care [facility]) - CM/SW Discharge Note   Patient Details  Name: Tonya [name] MRN: [id number] Date of Birth: June 25, 1946  [facility] CM/SW Contact:  Carles Collet, RN Phone Number: 08/17/2021, 9:55 AM   Clinical Narrative:    Orders in place for OP PT OT. Added [facility] Neuro to AVS, patient aware to call to expedite scheduling. No other TOC needs identified at this time          Patient Goals and CMS Choice        Discharge Placement                       Discharge Plan and Services                                     Social Determinants of Health (SDOH) Interventions     Readmission Risk Interventions No flowsheet data found.

## 2021-08-17 NOTE — Progress Notes (Addendum)
Physical Therapy Treatment Patient Details Name: Tonya [name] MRN: [id number] DOB: 1945-12-21 Today's Date: 08/17/2021   History of Present Illness 75 y.o. F admitted on 12/9 due to blurred vision and decreased balance. MRI is negative.  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    PT Comments    Patient overall doing better today. Appropriately scanning to her left with no imbalance and no running into objects on her left. Scored 21/24 on Dynamic Gait Index. Patient can benefit from followup OPPT to return to baseline independence with ambulation and balance.      Recommendations for follow up therapy are one component of a multi-disciplinary discharge planning process, led by the attending physician.  Recommendations may be updated based on patient status, additional functional criteria and insurance authorization.  Follow Up Recommendations  Outpatient PT     Assistance Recommended at Discharge Intermittent Supervision/Assistance  Equipment Recommendations  None recommended by PT (pt refuses use of assistive device)    Recommendations for Other Services       Precautions / Restrictions Precautions Precautions: Fall     Mobility  Bed Mobility Overal bed mobility: Modified Independent             General bed mobility comments: Increased time    Transfers Overall transfer level: Independent Equipment used: None Transfers: Sit to/from Stand Sit to Stand: Min guard;Independent           General transfer comment: no imbalance upon standing    Ambulation/Gait Ambulation/Gait assistance: Min guard;Supervision Gait Distance (Feet): 200 Feet Assistive device: None Gait Pattern/deviations: Step-through pattern   Gait velocity interpretation: 1.31 - 2.62 ft/sec, indicative of limited community ambulator   General Gait Details: no imbalance or  drift noted today   Stairs Stairs: Yes Stairs assistance: Modified independent (Device/Increase time) Stair Management: One rail Right;Alternating pattern;Forwards Number of Stairs: 5 General stair comments: no cues or assist needed. Very light use of rail   Wheelchair Mobility    Modified Rankin (Stroke Patients Only)       Balance Overall balance assessment: Mild deficits observed, not formally tested                               Standardized Balance Assessment Standardized Balance Assessment : Dynamic Gait Index   Dynamic Gait Index Level Surface: Mild Impairment Change in Gait Speed: Normal Gait with Horizontal Head Turns: Normal Gait with Vertical Head Turns: Normal Gait and Pivot Turn: Normal Step Over Obstacle: Mild Impairment Step Around Obstacles: Normal Steps: Mild Impairment Total Score: 21      Cognition Arousal/Alertness: Awake/alert Behavior During Therapy: WFL for tasks assessed/performed Overall Cognitive Status: Within Functional Limits for tasks assessed                                          Exercises      General Comments        Pertinent Vitals/Pain Pain Assessment: No/denies pain    Home Living                          Prior Function            PT Goals (current goals can now  be found in the care plan section) Acute Rehab PT Goals Patient Stated Goal: to improve her balance PT Goal Formulation: With patient Time For Goal Achievement: 08/30/21 Potential to Achieve Goals: Good Progress towards PT goals: Progressing toward goals    Frequency    Min 3X/week      PT Plan Current plan remains appropriate    Co-evaluation              AM-PAC PT "6 Clicks" Mobility   Outcome Measure  Help needed turning from your back to your side while in a flat bed without using bedrails?: None Help needed moving from lying on your back to sitting on the side of a flat bed without  using bedrails?: None Help needed moving to and from a bed to a chair (including a wheelchair)?: None Help needed standing up from a chair using your arms (e.g., wheelchair or bedside chair)?: None Help needed to walk in hospital room?: A Little Help needed climbing 3-5 steps with a railing? : A Little 6 Click Score: 22    End of Session Equipment Utilized During Treatment: Gait belt Activity Tolerance: Patient tolerated treatment well;No increased pain Patient left: with call bell/phone within reach;in bed   PT Visit Diagnosis: Unsteadiness on feet (R26.81);Difficulty in walking, not elsewhere classified (R26.2)     Time: 9326-7124 PT Time Calculation (min) (ACUTE ONLY): 10 min  Charges:  $Gait Training: 8-22 mins                      Arby Barrette, PT Acute Rehabilitation Services  Pager [phone] Office [phone]    Rexanne Mano 08/17/2021, 9:49 AM

## 2021-08-19 ENCOUNTER — Telehealth: Payer: Self-pay | Admitting: Medical Oncology

## 2021-08-19 ENCOUNTER — Other Ambulatory Visit: Payer: Self-pay

## 2021-08-19 ENCOUNTER — Ambulatory Visit: Payer: Medicare HMO | Attending: Physical Medicine & Rehabilitation

## 2021-08-19 ENCOUNTER — Inpatient Hospital Stay: Payer: Medicare HMO | Admitting: Internal Medicine

## 2021-08-19 VITALS — BP 133/77 | HR 63 | Temp 97.8°F | Resp 16 | Wt 138.3 lb

## 2021-08-19 DIAGNOSIS — C3490 Malignant neoplasm of unspecified part of unspecified bronchus or lung: Secondary | ICD-10-CM | POA: Diagnosis not present

## 2021-08-19 DIAGNOSIS — Z85118 Personal history of other malignant neoplasm of bronchus and lung: Secondary | ICD-10-CM | POA: Insufficient documentation

## 2021-08-19 NOTE — Progress Notes (Signed)
[facility] [phone]   Fax:(336) [place], [place] [address]  DIAGNOSIS: Stage IIB (T3, N0, M0) non-small cell lung cancer, adenocarcinoma involving the right upper lobe. The patient also has evidence for adenocarcinoma of the left upper lobe lung nodule as well as subcentimeter pulmonary nodules in the right lung concerning for stage IV lung cancer with multifocal disease bilaterally.  Biomarker Findings  Microsatellite status - MS-Equivocal ? Tumor Mutational Burden - Cannot Be Determined Genomic Findings For a complete list of the genes assayed, please refer to the Appendix. MET [id number] AXL T343M TP53 I254V 7 Disease relevant genes with no reportable alterations: ALK, BRAF, EGFR, ERBB2, KRAS, RET, ROS1  PDL1 Expression 20 %.  PRIOR THERAPY:  1) status post right upper lobectomy with lymph node sampling on August 26, 2020 under the care of Dr. LKipp Brood 2) Adjuvant systemic chemotherapy with cisplatin 75 mg/M2 and Alimta 550/M2 every 3 weeks. Last dose 01/17/21.  Status post 4 cycles   CURRENT THERAPY: Observation   INTERVAL HISTORY: Tonya STONEKING89y.o. female returns to the clinic today for follow-up visit accompanied by her husband.  The patient is feeling fine today with no concerning complaints.  She was admitted to the hospital recently for severe headache and was concerned about possibility of stroke.  She had several imaging studies including CT as well as MRI of the brain that were unremarkable.  She also had CT scan of the chest performed on August 07, 2021 and she is here for evaluation and discussion of her scan results.  She denied having any current chest pain, shortness of breath, cough or hemoptysis.  She denied having any fever or chills.  She has no nausea, vomiting, diarrhea or constipation.  She has no recent weight loss or night sweats.   MEDICAL  HISTORY: Past Medical History:  Diagnosis Date   Adenoma of left adrenal gland    Cancer ([facility]    Lung   Colon polyps    hyperplastic   Complication of anesthesia    Very emotional and cries after anesthesia   GERD (gastroesophageal reflux disease)    High blood pressure 03/25/2021   Kidney stone    Liver lesion    Microhematuria    Stroke (HCC)     ALLERGIES:  is allergic to other, penicillins, and tizanidine.  MEDICATIONS:  Current Outpatient Medications  Medication Sig Dispense Refill   acetaminophen (TYLENOL) 325 MG tablet Take 1-2 tablets (325-650 mg total) by mouth every 4 (four) hours as needed for mild pain.     albuterol (VENTOLIN HFA) 108 (90 Base) MCG/ACT inhaler Inhale 2 puffs into the lungs every 6 (six) hours as needed for wheezing or shortness of breath. 8 g 2   alendronate (FOSAMAX) 70 MG tablet Take 70 mg by mouth every Saturday.     ALPRAZolam (XANAX) 1 MG tablet Take 1 mg by mouth at bedtime as needed for sleep.     Calcium Carbonate-Vitamin D (CALCIUM + D PO) Take 1 tablet by mouth 2 (two) times daily.     folic acid (FOLVITE) 1 MG tablet TAKE 1 TABLET(1 MG) BY MOUTH DAILY (Patient not taking: Reported on 08/15/2021) 30 tablet 4   HYDROcodone-acetaminophen (NORCO) 10-325 MG tablet Take 1 tablet by mouth every 6 (six) hours as needed for moderate pain.     lidocaine (LIDODERM) 5 % Place 3 patches onto the skin  daily. Apply at 8 am and remove at 8 pm daily. (Patient taking differently: Place 3 patches onto the skin as needed. Apply at 8 am and remove at 8 pm daily.) 90 patch 0   magnesium oxide (MAG-OX) 400 (241.3 Mg) MG tablet Take 1 tablet (400 mg total) by mouth 2 (two) times daily. (Patient not taking: Reported on 08/15/2021) 60 tablet 1   Menthol-Methyl Salicylate (MUSCLE RUB) 10-15 % CREA Apply 1 application topically 2 (two) times daily. (Patient taking differently: Apply 1 application topically daily as needed for muscle pain.)  0   NARCAN 4 MG/0.1ML LIQD  nasal spray kit 1 spray once.     omeprazole (PRILOSEC) 40 MG capsule Take 40 mg by mouth daily.     oxyCODONE-acetaminophen (PERCOCET) 10-325 MG tablet Take 1 tablet by mouth every 4 (four) hours as needed.     oxyCODONE-acetaminophen (PERCOCET/ROXICET) 5-325 MG tablet Take 1-2 tablets by mouth 2 (two) times daily as needed for severe pain (severe pain). 28 tablet 0   rosuvastatin (CRESTOR) 10 MG tablet Take 10 mg by mouth at bedtime.     umeclidinium-vilanterol (ANORO ELLIPTA) 62.5-25 MCG/INH AEPB Inhale 1 puff into the lungs daily. (Patient taking differently: Inhale 1 puff into the lungs as needed.) 60 each 0   No current facility-administered medications for this visit.    SURGICAL HISTORY:  Past Surgical History:  Procedure Laterality Date   ABDOMINAL HYSTERECTOMY     APPENDECTOMY     BRONCHIAL BIOPSY  07/18/2020   Procedure: BRONCHIAL BIOPSIES;  Surgeon: [name], DO;  Location: [facility];  Service: Pulmonary;;   BRONCHIAL BRUSHINGS  07/18/2020   Procedure: BRONCHIAL BRUSHINGS;  Surgeon: [name], DO;  Location: [facility];  Service: Pulmonary;;   BRONCHIAL NEEDLE ASPIRATION BIOPSY  07/18/2020   Procedure: BRONCHIAL NEEDLE ASPIRATION BIOPSIES;  Surgeon: [name], DO;  Location: [facility];  Service: Pulmonary;;   BRONCHIAL WASHINGS  07/18/2020   Procedure: BRONCHIAL WASHINGS;  Surgeon: [name], DO;  Location: [facility];  Service: Pulmonary;;   FIDUCIAL MARKER PLACEMENT  07/18/2020   Procedure: FIDUCIAL MARKER PLACEMENT;  Surgeon: [name], DO;  Location: [facility];  Service: Pulmonary;;   INTERCOSTAL NERVE BLOCK Right 08/26/2020   Procedure: INTERCOSTAL NERVE BLOCK;  Surgeon: [name], MD;  Location: [facility];  Service: Thoracic;  Laterality: Right;   IR THORACENTESIS ASP PLEURAL SPACE W/IMG GUIDE  10/10/2020   NODE DISSECTION Right 08/26/2020   Procedure: NODE DISSECTION;  Surgeon: [name], MD;  Location:  [facility];  Service: Thoracic;  Laterality: Right;   THORACENTESIS Right 11/04/2020   Procedure: [facility];  Surgeon: [name], DO;  Location: [facility];  Service: Pulmonary;  Laterality: Right;   THORACENTESIS Right 11/29/2020   Procedure: THORACENTESIS;  Surgeon: [name], DO;  Location: [facility];  Service: Pulmonary;  Laterality: Right;   VIDEO BRONCHOSCOPY WITH ENDOBRONCHIAL NAVIGATION N/A 07/18/2020   Procedure: VIDEO BRONCHOSCOPY WITH ENDOBRONCHIAL NAVIGATION;  Surgeon: [name], DO;  Location: [facility];  Service: Pulmonary;  Laterality: N/A;   VIDEO BRONCHOSCOPY WITH ENDOBRONCHIAL ULTRASOUND  07/18/2020   Procedure: VIDEO BRONCHOSCOPY WITH ENDOBRONCHIAL ULTRASOUND;  Surgeon: [name], DO;  Location: [facility];  Service: Pulmonary;;    REVIEW OF SYSTEMS:  A comprehensive review of systems was negative.   PHYSICAL EXAMINATION: General appearance: alert, cooperative, and no distress Head: Normocephalic, without obvious abnormality, atraumatic Neck: no adenopathy, no JVD, supple, symmetrical, trachea midline, and  thyroid not enlarged, symmetric, no tenderness/mass/nodules Lymph nodes: Cervical, supraclavicular, and axillary nodes normal. Resp: clear to auscultation bilaterally Back: symmetric, no curvature. ROM normal. No CVA tenderness. Cardio: regular rate and rhythm, S1, S2 normal, no murmur, click, rub or gallop GI: soft, non-tender; bowel sounds normal; no masses,  no organomegaly Extremities: extremities normal, atraumatic, no cyanosis or edema  ECOG PERFORMANCE STATUS: 1 - Symptomatic but completely ambulatory  Blood pressure 133/77, pulse 63, temperature 97.8 F (36.6 C), temperature source Tympanic, resp. rate 16, weight 138 lb 4.8 oz (62.7 kg), SpO2 100 %.  LABORATORY DATA: Lab Results  Component Value Date   WBC 5.6 08/16/2021   HGB 10.9 (L) 08/16/2021   HCT 33.3 (L) 08/16/2021   MCV 95.4 08/16/2021   PLT 227 08/16/2021       Chemistry      Component Value Date/Time   NA 137 08/16/2021 0451   K 4.1 08/16/2021 0451   CL 107 08/16/2021 0451   CO2 20 (L) 08/16/2021 0451   BUN 11 08/16/2021 0451   CREATININE 0.93 08/16/2021 0451   CREATININE 1.03 (H) 08/07/2021 1001      Component Value Date/Time   CALCIUM 9.2 08/16/2021 0451   ALKPHOS 76 08/15/2021 1220   AST 14 (L) 08/15/2021 1220   AST 14 (L) 08/07/2021 1001   ALT 8 08/15/2021 1220   ALT 6 08/07/2021 1001   BILITOT 0.6 08/15/2021 1220   BILITOT 0.2 (L) 08/07/2021 1001       RADIOGRAPHIC STUDIES: CT ANGIO HEAD NECK W WO CM  Result Date: 08/15/2021 CLINICAL DATA:  Acute neuro deficit. Left eye vision change. Difficulty walking. History of stroke EXAM: CT ANGIOGRAPHY HEAD AND NECK TECHNIQUE: Multidetector CT imaging of the head and neck was performed using the standard protocol during bolus administration of intravenous contrast. Multiplanar CT image reconstructions and MIPs were obtained to evaluate the vascular anatomy. Carotid stenosis measurements (when applicable) are obtained utilizing NASCET criteria, using the distal internal carotid diameter as the denominator. CONTRAST:  67m OMNIPAQUE IOHEXOL 350 MG/ML SOLN COMPARISON:  CT head 08/15/2021 FINDINGS: CT HEAD FINDINGS Brain: Chronic infarct in the right medial parietal lobe unchanged. No superimposed acute infarct, hemorrhage, mass. Ventricle size normal. Vascular: Negative for hyperdense vessel Skull: Negative Sinuses: Negative Orbits: Negative Review of the MIP images confirms the above findings CTA NECK FINDINGS Aortic arch: Standard branching. Imaged portion shows no evidence of aneurysm or dissection. No significant stenosis of the major arch vessel origins. Mild atherosclerotic disease in the aortic arch. Right carotid system: Right carotid widely patent. Negative for atherosclerotic disease or dissection. Tortuosity of the right internal carotid artery. Left carotid system: Mild  atherosclerotic disease left carotid bifurcation. Negative for stenosis. Tortuosity of the left internal carotid artery. Vertebral arteries: Both vertebral arteries patent to the basilar. Skeleton: No acute skeletal abnormality.  Multiple dental caries. Other neck: Negative for soft tissue mass or adenopathy. Mild thyromegaly without significant nodularity. Upper chest: Mild apical emphysema. 8 mm spiculated nodule left upper lobe unchanged from CT chest 02/04/2021. Review of the MIP images confirms the above findings CTA HEAD FINDINGS Anterior circulation: Cavernous carotid widely patent bilaterally. Anterior and middle cerebral arteries are widely patent bilaterally. Negative for large vessel occlusion. Posterior circulation: Both vertebral arteries patent to the basilar. PICA patent bilaterally. Basilar widely patent. Posterior cerebral arteries widely patent bilaterally. Fetal origin of the right posterior cerebral artery. Venous sinuses: Normal venous enhancement Anatomic variants: None Review of the MIP images confirms the above  findings IMPRESSION: 1. Negative for intracranial stenosis or large vessel occlusion 2. No significant carotid or vertebral artery stenosis in the neck. 3. CT head demonstrates no acute abnormality. Chronic infarct right parietal lobe 4. 8 mm spiculated nodule left upper lobe, unchanged from prior chest CT 02/04/2021 Electronically Signed   By: Franchot Gallo M.D.   On: 08/15/2021 14:48   CT HEAD WO CONTRAST  Result Date: 08/15/2021 CLINICAL DATA:  Acute neuro deficit.  History of stroke. EXAM: CT HEAD WITHOUT CONTRAST TECHNIQUE: Contiguous axial images were obtained from the base of the skull through the vertex without intravenous contrast. COMPARISON:  CT head 03/02/2021.  MRI head 07/04/2021 FINDINGS: Brain: Chronic encephalomalacia right medial parietal lobe unchanged from prior MRI. Negative for acute infarct, hemorrhage, mass. Ventricle size normal. Vascular: Negative for  hyperdense vessel Skull: Negative Sinuses/Orbits: Paranasal sinuses clear.  Negative orbit Other: None IMPRESSION: No acute abnormality.  Chronic infarct right parietal lobe. Electronically Signed   By: Franchot Gallo M.D.   On: 08/15/2021 12:45   CT Chest W Contrast  Result Date: 08/08/2021 CLINICAL DATA:  Primary Cancer Type: Lung Imaging Indication: Routine surveillance Interval therapy since last imaging? No Initial Cancer Diagnosis Date: 08/26/2020; Established by: Biopsy-proven Detailed Pathology: Stage IIB non-small cell lung cancer, adenocarcinoma. Primary Tumor location:  Right upper lobe. Left apical nodule. Surgeries: Right upper lobectomy 08/26/2020. Hysterectomy, appendectomy. Chemotherapy: Yes; Ongoing? No; Most recent administration: 01/17/2021 Immunotherapy? No Radiation therapy? No EXAM: CT CHEST WITH CONTRAST TECHNIQUE: Multidetector CT imaging of the chest was performed during intravenous contrast administration. CONTRAST:  43m OMNIPAQUE IOHEXOL 350 MG/ML SOLN COMPARISON:  Most recent CT chest 05/06/2021.  07/03/2020 PET-CT. FINDINGS: Cardiovascular: The heart size is normal. No substantial pericardial effusion. Enlargement of the pulmonary outflow tract and main pulmonary arteries suggests pulmonary arterial hypertension. Mild atherosclerotic calcification is noted in the wall of the thoracic aorta. Similar appearance 2.4 cm left thyroid nodule. Mediastinum/Nodes: No mediastinal lymphadenopathy. There is no hilar lymphadenopathy. The esophagus has normal imaging features. There is no axillary lymphadenopathy. Lungs/Pleura: Centrilobular and paraseptal emphysema evident. Prior right upper lobectomy. Stable volume loss right hemithorax. Staple line parahilar right lung with associated soft tissue density is stable in the interval measuring approximately 12 mm thickness today, unchanged. Peripheral scarring again noted right lower lobe. Spiculated left apical nodule measured previously at 8 x 6  mm is unchanged at 8 x 6 mm today. Adjacent fiducial markers again noted. Tiny peripheral left upper lobe nodule identified on the previous study is stable on image 43/series 5 today. No new suspicious nodule or mass. Upper Abdomen: Tiny focus of hyperenhancement in the dome of the liver on the previous study is stable on image 92/2 today. Images of the upper abdomen show persistent intra and extrahepatic biliary duct dilatation, similar to prior. Common bile duct measures 9 mm diameter. 3.8 cm interpolar left renal cyst is stable. Tiny nonobstructing stones again noted upper pole left kidney and lower pole right kidney. Musculoskeletal: No worrisome lytic or sclerotic osseous abnormality. IMPRESSION: 1. Stable exam. No new or progressive interval findings. 2. Stable appearance of the spiculated left apical nodule with adjacent fiducial markers. 3. Status post right upper lobectomy with stable appearance of post treatment scarring in the parahilar right lung. 4. Stable intra and extrahepatic biliary duct dilatation. The tiny focus of hyperenhancement identified previously in the dome of the liver is unchanged. Continued attention on follow-up recommended. 5. Bilateral nephrolithiasis. 6. Aortic Atherosclerosis (ICD10-I70.0) and Emphysema (ICD10-J43.9). Electronically Signed  By: Misty Stanley M.D.   On: 08/08/2021 10:54   MR BRAIN WO CONTRAST  Result Date: 08/15/2021 CLINICAL DATA:  Neuro deficit, acute, stroke suspected. Additional history provided: Left eye vision changes, difficulty walking/grasping. 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 Weakness has become worse. EXAM: MRI HEAD WITHOUT CONTRAST TECHNIQUE: Multiplanar, multiecho pulse sequences of the brain and surrounding structures were obtained without intravenous contrast. COMPARISON:  Non-contrast head CT and CT angiogram head/neck performed earlier today 08/15/2021. Brain MRI 07/04/2021. FINDINGS: Brain:  Redemonstrated chronic cortical/subcortical infarct within the right parietal lobe with associated chronic hemosiderin deposition. Subtle ex vacuo dilatation of the right lateral ventricle. Background cerebral volume is normal for age. There is no acute infarct. No evidence of an intracranial mass. No chronic intracranial blood products. No extra-axial fluid collection. No midline shift. Vascular: Maintained flow voids within the proximal large arterial vessels. Skull and upper cervical spine: No focal suspicious marrow lesion. Sinuses/Orbits: Visualized orbits show no acute finding. No significant paranasal sinus disease. Other: Right mastoid effusion. IMPRESSION: No evidence of acute intracranial abnormality. Redemonstrated chronic cortical/subcortical infarct within the right parietal lobe, with associated chronic blood products. Otherwise unremarkable non-contrast MRI appearance of the brain. Right mastoid effusion. Electronically Signed   By: Kellie Simmering D.O.   On: 08/15/2021 15:39     ASSESSMENT AND PLAN: This is a very pleasant 75 years old African-American female recently diagnosed with a stage IIb (T3, N0, M0) non-small cell lung cancer, adenocarcinoma involving the right upper lobe with 2 nodules. The patient also has evidence for adenocarcinoma of the left upper lobe and subcentimeter nodules in the right lung concerning for stage IV with multifocal disease bilaterally. She is status post right upper lobectomy with lymph node dissection on August 26, 2020 under the care of Dr. Kipp Brood. Her molecular studies by [facility] 1 showed no actionable mutations. She has PD-L1 expression of 20%. She underwent adjuvant treatment with systemic chemotherapy with cisplatin 75 mg/M2 and Alimta 500 mg/M2 every 3 weeks status post 4 cycles, completed Jan 17, 2021. The patient is currently on observation and she is feeling fine. The CT scan of the chest performed on August 08, 2021 showed no concerning  findings for disease recurrence or metastasis. I recommended for the patient to continue on observation with repeat CT scan of the chest in 6 months. The patient was advised to call immediately if she has any other concerning symptoms in the interval. The patient voices understanding of current disease status and treatment options and is in agreement with the current care plan.  All questions were answered. The patient knows to call the clinic with any problems, questions or concerns. We can certainly see the patient much sooner if necessary.  Disclaimer: This note was dictated with voice recognition software. Similar sounding words can inadvertently be transcribed and may not be corrected upon review.

## 2021-08-19 NOTE — Telephone Encounter (Signed)
Pt in waiting room

## 2021-08-21 ENCOUNTER — Other Ambulatory Visit: Payer: Self-pay

## 2021-08-21 ENCOUNTER — Ambulatory Visit: Payer: Medicare HMO

## 2021-08-21 DIAGNOSIS — I69118 Other symptoms and signs 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: Secondary | ICD-10-CM | POA: Diagnosis present

## 2021-08-21 DIAGNOSIS — R41840 Attention and concentration deficit: Secondary | ICD-10-CM | POA: Insufficient documentation

## 2021-08-21 DIAGNOSIS — R41841 Cognitive communication deficit: Secondary | ICD-10-CM | POA: Diagnosis present

## 2021-08-21 DIAGNOSIS — R41842 Visuospatial deficit: Secondary | ICD-10-CM | POA: Diagnosis present

## 2021-08-21 DIAGNOSIS — R2681 Unsteadiness on feet: Secondary | ICD-10-CM | POA: Diagnosis present

## 2021-08-21 NOTE — Therapy (Signed)
[facility] [address] [place], [place], [address] Phone: [phone]   Fax:  [phone]  Speech Language Pathology Treatment  Patient Details  Name: Tonya [name] MRN: [id number] Date of Birth: September 17, 1945 Referring Provider (SLP): Charlett Blake, MD   Encounter Date: 08/21/2021   End of Session - 08/21/21 0835     Visit Number 12    Number of Visits 17    Date for SLP Re-Evaluation 09/05/21    Authorization Type [employer]    Authorization Time Period 6 SLP visits 07/29/2021 - 09/06/2021    Authorization - Visit Number 3    Authorization - Number of Visits 6    SLP Start Time 0845    SLP Stop Time  0930    SLP Time Calculation (min) 45 min    Activity Tolerance Patient tolerated treatment well             Past Medical History:  Diagnosis Date   Adenoma of left adrenal gland    Cancer ([place])    Lung   Colon polyps    hyperplastic   Complication of anesthesia    Very emotional and cries after anesthesia   GERD (gastroesophageal reflux disease)    High blood pressure 03/25/2021   Kidney stone    Liver lesion    Microhematuria    Stroke [facility])     Past Surgical History:  Procedure Laterality Date   ABDOMINAL HYSTERECTOMY     APPENDECTOMY     BRONCHIAL BIOPSY  07/18/2020   Procedure: BRONCHIAL BIOPSIES;  Surgeon: [name], DO;  Location: [place] [facility];  Service: Pulmonary;;   BRONCHIAL BRUSHINGS  07/18/2020   Procedure: BRONCHIAL BRUSHINGS;  Surgeon: [name], DO;  Location: [place];  Service: Pulmonary;;   BRONCHIAL NEEDLE ASPIRATION BIOPSY  07/18/2020   Procedure: BRONCHIAL NEEDLE ASPIRATION BIOPSIES;  Surgeon: [name], DO;  Location: [place] [facility];  Service: Pulmonary;;   BRONCHIAL WASHINGS  07/18/2020   Procedure: BRONCHIAL WASHINGS;  Surgeon: [name], DO;  Location: [place] [facility];  Service: Pulmonary;;   FIDUCIAL MARKER PLACEMENT   07/18/2020   Procedure: FIDUCIAL MARKER PLACEMENT;  Surgeon: [name], DO;  Location: [place] [facility];  Service: Pulmonary;;   INTERCOSTAL NERVE BLOCK Right 08/26/2020   Procedure: INTERCOSTAL NERVE BLOCK;  Surgeon: [name], MD;  Location: [place];  Service: Thoracic;  Laterality: Right;   IR THORACENTESIS ASP PLEURAL SPACE W/IMG GUIDE  10/10/2020   NODE DISSECTION Right 08/26/2020   Procedure: NODE DISSECTION;  Surgeon: [name], MD;  Location: [place];  Service: Thoracic;  Laterality: Right;   THORACENTESIS Right 11/04/2020   Procedure: Mathews Robinsons;  Surgeon: [name], DO;  Location: [place] [facility];  Service: Pulmonary;  Laterality: Right;   THORACENTESIS Right 11/29/2020   Procedure: THORACENTESIS;  Surgeon: [name], DO;  Location: [facility];  Service: Pulmonary;  Laterality: Right;   VIDEO BRONCHOSCOPY WITH ENDOBRONCHIAL NAVIGATION N/A 07/18/2020   Procedure: VIDEO BRONCHOSCOPY WITH ENDOBRONCHIAL NAVIGATION;  Surgeon: [name], DO;  Location: [place];  Service: Pulmonary;  Laterality: N/A;   VIDEO BRONCHOSCOPY WITH ENDOBRONCHIAL ULTRASOUND  07/18/2020   Procedure: VIDEO BRONCHOSCOPY WITH ENDOBRONCHIAL ULTRASOUND;  Surgeon: [name], DO;  Location: [place] [facility];  Service: Pulmonary;;    There were no vitals filed for this visit.   Subjective Assessment - 08/21/21 0844     Subjective "It's been rough"    Currently in Pain? No/denies  ADULT SLP TREATMENT - 08/21/21 0835       General Information   Behavior/Cognition Alert;Cooperative;Pleasant mood      Treatment Provided   Treatment provided Cognitive-Linquistic      Cognitive-Linquistic Treatment   Treatment focused on Cognition;Patient/family/caregiver education    Skilled Treatment Pt verbally recapped recent hospitalization for stroke-like symptoms. Good recall exhibited per chart review. Pt expressed frustration secondary to perception  of set back following recent good progress with therapy. Pt endorsed feeling over-whelmed with a lot of thoughts. SLP suggested patient write down thoughts to aid thought organization. Pt reportedly missed ST appointment two days ago as pt did not have appointment in phone calendar. Pt felt she had an appointment but did not double check with calendar at home or call front office. SLP re-educated importance of double checking. SLP assisted with setting up calendar appointments, in which pt required intermittent mod fading to rare min A.      Assessment / Recommendations / Plan   Plan Continue with current plan of care     Progression Toward Goals   Progression toward goals Progressing toward goals              SLP Education - 08/21/21 1249     Education Details double check    Person(s) Educated Patient    Methods Explanation;Demonstration;Verbal cues    Comprehension Verbalized understanding;Returned demonstration;Need further instruction              SLP Short Term Goals - 08/21/21 1251       SLP SHORT TERM GOAL #1   Title Pt will use memory compensations for appointments, finances, and other daily activities with rare min A  over 2 sessions    Baseline 06-23-21, 07-01-21    Status Achieved      SLP SHORT TERM GOAL #2   Title Pt will use verbalize and demo accurate problem solving for functional scenarios encountered at home with rare min A over 2 sessions    Baseline 07-01-21, 07-04-21    Status Achieved      SLP SHORT TERM GOAL #3   Title Pt will ID and correct errors on structured cognitive tasks with 80% accuracy given rare min A over 2 sessions    Baseline 07-08-21    Status Partially Met      SLP SHORT TERM GOAL #4   Title Pt will implement strategy to ensure stove is turned off after cooking for 3/3 opportunities    Baseline 06-23-21, 07-01-21, 07-04-21    Status Achieved      SLP SHORT TERM GOAL #5   Title Pt will complete cognitive PROM in first few  sessions    Status Achieved              SLP Long Term Goals - 08/21/21 0836       SLP LONG TERM GOAL #1   Title Pt will independently manage appointments and bill pay with use of compensations with no errors reported for 3/3 opportunities    Status Achieved      SLP LONG TERM GOAL #2   Title Pt will use independently demo accurate problem solving for functional scenarios encountered at home over 2 sessions    Baseline 08-06-21    Status Partially Met      SLP LONG TERM GOAL #3   Title Pt will report improved cognitive linguistic functioning on PROM by 5 points at last ST session    Time 4    Period  Weeks    Status On-going   ongoing for recert     SLP LONG TERM GOAL #4   Title Pt will independently identify any errors on structured or unstructured cognitive tasks to be 90% accurate given rare min A over 2 sessions    Time 4    Period Weeks    Status On-going              Plan - 08/21/21 0835     Clinical Impression Statement "Dymphna" was referred for OPST to address cognition secondary to 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 Overall improving cognition reported by patient. Good carryover of SLP recommendations and strategies indicated. SLP provided A to set up remaining appointment in phone calendar, with occasional min to mod cues required to aid attention, thought organization, and error awareness. SLP recommends skilled ST intervention to address cognitive linguistic skills to maximize return to PLOF and increase functional independence.    Speech Therapy Frequency 2x / week    Duration 8 weeks    Treatment/Interventions Patient/family education;Functional tasks;Cognitive reorganization;Compensatory techniques;Internal/external aids;SLP instruction and feedback;Environmental controls;Cueing hierarchy;Compensatory strategies    Potential to Achieve Goals Good    SLP Home Exercise Plan provided    Consulted and Agree with Plan of Care Patient;Family member/caregiver              Patient will benefit from skilled therapeutic intervention in order to improve the following deficits and impairments:   Cognitive communication deficit    Problem List Patient Active Problem List   Diagnosis Date Noted   Blurred vision 08/16/2021   Migraine 08/16/2021   Swelling of both lower extremities 05/08/2021   Frequent headaches 03/25/2021   Sciatica of left side 03/25/2021   Chronic low back pain 03/25/2021   Anxiety disorder 03/25/2021   Intraparenchymal hematoma of brain 02/27/2021   Stroke, 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   Intracranial hemorrhage ([place]) 02/22/2021   Chemotherapy induced neutropenia ([place]) 01/08/2021   Bradycardia 01/01/2021   Hypomagnesemia 12/10/2020   Nausea without vomiting 12/10/2020   Status post thoracentesis    Pleural effusion, right 11/01/2020   Adenocarcinoma of right lung, stage 2 ([place]) 09/11/2020   Encounter for antineoplastic chemotherapy 09/11/2020   S/P Robotic Assited Video Thoracoscopy with Right Upper Lobectomy Lung 08/26/2020   Mediastinal adenopathy 07/18/2020   Lung nodules    Lung nodule 06/14/2020   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   Tobacco dependence 05/29/2020   Exertional chest pain 05/28/2020   Numbness and tingling of left arm and leg 07/01/2012   Hyperlipidemia with target low density lipoprotein (LDL) cholesterol less than 100 mg/dL 07/01/2012   Chest pressure 06/30/2012   GERD (gastroesophageal reflux disease) 06/30/2012    Alinda Deem, CCC-SLP 08/21/2021, 12:51 PM  [facility] [address] [place], [place], [address] Phone: [phone]   Fax:  [phone]   Name: Tonya [name] MRN: [id number] Date of Birth: [birth year]

## 2021-08-22 ENCOUNTER — Ambulatory Visit: Payer: Medicare HMO

## 2021-08-22 DIAGNOSIS — R41841 Cognitive communication deficit: Secondary | ICD-10-CM

## 2021-08-22 DIAGNOSIS — R41842 Visuospatial deficit: Secondary | ICD-10-CM | POA: Diagnosis not present

## 2021-08-22 NOTE — Therapy (Signed)
[facility] [address] [place], [place], [address] Phone: [phone]   Fax:  [phone]  Speech Language Pathology Treatment  Patient Details  Name: Tonya [name] MRN: [id number] Date of Birth: 01-08-1946 Referring Provider (SLP): Charlett Blake, MD   Encounter Date: 08/22/2021   End of Session - 08/22/21 0749     Visit Number 13    Number of Visits 17    Date for SLP Re-Evaluation 09/05/21    Authorization Type [employer]    Authorization Time Period 6 SLP visits 07/29/2021 - 09/06/2021    Authorization - Visit Number 4    Authorization - Number of Visits 6    SLP Start Time 0800    SLP Stop Time  6283    SLP Time Calculation (min) 41 min    Activity Tolerance Patient tolerated treatment well             Past Medical History:  Diagnosis Date   Adenoma of left adrenal gland    Cancer ([place])    Lung   Colon polyps    hyperplastic   Complication of anesthesia    Very emotional and cries after anesthesia   GERD (gastroesophageal reflux disease)    High blood pressure 03/25/2021   Kidney stone    Liver lesion    Microhematuria    Stroke [facility])     Past Surgical History:  Procedure Laterality Date   ABDOMINAL HYSTERECTOMY     APPENDECTOMY     BRONCHIAL BIOPSY  07/18/2020   Procedure: BRONCHIAL BIOPSIES;  Surgeon: [name], DO;  Location: [place] [facility];  Service: Pulmonary;;   BRONCHIAL BRUSHINGS  07/18/2020   Procedure: BRONCHIAL BRUSHINGS;  Surgeon: [name], DO;  Location: [place];  Service: Pulmonary;;   BRONCHIAL NEEDLE ASPIRATION BIOPSY  07/18/2020   Procedure: BRONCHIAL NEEDLE ASPIRATION BIOPSIES;  Surgeon: [name], DO;  Location: [place] [facility];  Service: Pulmonary;;   BRONCHIAL WASHINGS  07/18/2020   Procedure: BRONCHIAL WASHINGS;  Surgeon: [name], DO;  Location: [place] [facility];  Service: Pulmonary;;   FIDUCIAL MARKER PLACEMENT   07/18/2020   Procedure: FIDUCIAL MARKER PLACEMENT;  Surgeon: [name], DO;  Location: [place] [facility];  Service: Pulmonary;;   INTERCOSTAL NERVE BLOCK Right 08/26/2020   Procedure: INTERCOSTAL NERVE BLOCK;  Surgeon: [name], MD;  Location: [place];  Service: Thoracic;  Laterality: Right;   IR THORACENTESIS ASP PLEURAL SPACE W/IMG GUIDE  10/10/2020   NODE DISSECTION Right 08/26/2020   Procedure: NODE DISSECTION;  Surgeon: [name], MD;  Location: [place];  Service: Thoracic;  Laterality: Right;   THORACENTESIS Right 11/04/2020   Procedure: Mathews Robinsons;  Surgeon: [name], DO;  Location: [place] [facility];  Service: Pulmonary;  Laterality: Right;   THORACENTESIS Right 11/29/2020   Procedure: THORACENTESIS;  Surgeon: [name], DO;  Location: [facility];  Service: Pulmonary;  Laterality: Right;   VIDEO BRONCHOSCOPY WITH ENDOBRONCHIAL NAVIGATION N/A 07/18/2020   Procedure: VIDEO BRONCHOSCOPY WITH ENDOBRONCHIAL NAVIGATION;  Surgeon: [name], DO;  Location: [place];  Service: Pulmonary;  Laterality: N/A;   VIDEO BRONCHOSCOPY WITH ENDOBRONCHIAL ULTRASOUND  07/18/2020   Procedure: VIDEO BRONCHOSCOPY WITH ENDOBRONCHIAL ULTRASOUND;  Surgeon: [name], DO;  Location: [place] [facility];  Service: Pulmonary;;    There were no vitals filed for this visit.   Subjective Assessment - 08/22/21 0800     Subjective "a litle melancholy"    Currently in Pain? Yes  Pain Score 5     Pain Location [place]                   ADULT SLP TREATMENT - 08/22/21 0749       General Information   Behavior/Cognition Alert;Cooperative;Pleasant mood      Treatment Provided   Treatment provided Cognitive-Linquistic      Cognitive-Linquistic Treatment   Treatment focused on Cognition;Patient/family/caregiver education    Skilled Treatment Pt will plan to begin daily journal again to aid recall. Pt returned with previous HEP with frustration reported  secondary to reduced mental flexability for deductive reasoning tasks. SLP provided A to aid comprehension and completion of task. SLP conducted visual recall task, in which pt completed with average of 83%. Pt able to self-correct all errors with secondary repetition. After three tasks, pt endorsed onset of headache and requested short break.      Assessment / Recommendations / Plan   Plan Continue with current plan of care     Progression Toward Goals   Progression toward goals Progressing toward goals              SLP Education - 08/22/21 0841     Education Details slow down, daily journal    Person(s) Educated Patient    Methods Explanation;Demonstration    Comprehension Verbalized understanding;Returned demonstration;Need further instruction              SLP Short Term Goals - 08/21/21 1251       SLP SHORT TERM GOAL #1   Title Pt will use memory compensations for appointments, finances, and other daily activities with rare min A  over 2 sessions    Baseline 06-23-21, 07-01-21    Status Achieved      SLP SHORT TERM GOAL #2   Title Pt will use verbalize and demo accurate problem solving for functional scenarios encountered at home with rare min A over 2 sessions    Baseline 07-01-21, 07-04-21    Status Achieved      SLP SHORT TERM GOAL #3   Title Pt will ID and correct errors on structured cognitive tasks with 80% accuracy given rare min A over 2 sessions    Baseline 07-08-21    Status Partially Met      SLP SHORT TERM GOAL #4   Title Pt will implement strategy to ensure stove is turned off after cooking for 3/3 opportunities    Baseline 06-23-21, 07-01-21, 07-04-21    Status Achieved      SLP SHORT TERM GOAL #5   Title Pt will complete cognitive PROM in first few sessions    Status Achieved              SLP Long Term Goals - 08/22/21 0750       SLP LONG TERM GOAL #1   Title Pt will independently manage appointments and bill pay with use of  compensations with no errors reported for 3/3 opportunities    Status Achieved      SLP LONG TERM GOAL #2   Title Pt will use independently demo accurate problem solving for functional scenarios encountered at home over 2 sessions    Baseline 08-06-21    Status Partially Met      SLP LONG TERM GOAL #3   Title Pt will report improved cognitive linguistic functioning on PROM by 5 points at last ST session    Time 4    Period Weeks    Status On-going  ongoing for recert   Target Date 58/59/29      SLP LONG TERM GOAL #4   Title Pt will independently identify any errors on structured or unstructured cognitive tasks to be 90% accurate given rare min A over 2 sessions    Baseline 08-22-21    Time 4    Period Weeks    Status On-going    Target Date 09/05/21              Plan - 08/22/21 0750     Clinical Impression Statement "Wrenly" was referred for OPST to address cognition secondary to 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 Overall improving cognition reported by patient. Good carryover of SLP recommendations and strategies indicated. SLP provided A to double check and aid problem solving for mod complex task today. Pt will begin daily journaling at home to aid recall. SLP recommends skilled ST intervention to address cognitive linguistic skills to maximize return to PLOF and increase functional independence.    Speech Therapy Frequency 2x / week    Duration 8 weeks    Treatment/Interventions Patient/family education;Functional tasks;Cognitive reorganization;Compensatory techniques;Internal/external aids;SLP instruction and feedback;Environmental controls;Cueing hierarchy;Compensatory strategies    Potential to Achieve Goals Good    SLP Home Exercise Plan provided    Consulted and Agree with Plan of Care Patient;Family member/caregiver             Patient will benefit from skilled therapeutic intervention in order to improve the following deficits and impairments:   Cognitive  communication deficit    Problem List Patient Active Problem List   Diagnosis Date Noted   Blurred vision 08/16/2021   Migraine 08/16/2021   Swelling of both lower extremities 05/08/2021   Frequent headaches 03/25/2021   Sciatica of left side 03/25/2021   Chronic low back pain 03/25/2021   Anxiety disorder 03/25/2021   Intraparenchymal hematoma of brain 02/27/2021   Stroke, 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   Intracranial hemorrhage ([place]) 02/22/2021   Chemotherapy induced neutropenia ([place]) 01/08/2021   Bradycardia 01/01/2021   Hypomagnesemia 12/10/2020   Nausea without vomiting 12/10/2020   Status post thoracentesis    Pleural effusion, right 11/01/2020   Adenocarcinoma of right lung, stage 2 ([place]) 09/11/2020   Encounter for antineoplastic chemotherapy 09/11/2020   S/P Robotic Assited Video Thoracoscopy with Right Upper Lobectomy Lung 08/26/2020   Mediastinal adenopathy 07/18/2020   Lung nodules    Lung nodule 06/14/2020   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   Tobacco dependence 05/29/2020   Exertional chest pain 05/28/2020   Numbness and tingling of left arm and leg 07/01/2012   Hyperlipidemia with target low density lipoprotein (LDL) cholesterol less than 100 mg/dL 07/01/2012   Chest pressure 06/30/2012   GERD (gastroesophageal reflux disease) 06/30/2012    Alinda Deem, CCC-SLP 08/22/2021, 8:43 AM  [facility] [address] [place], [place], [address] Phone: [phone]   Fax:  [phone]   Name: AINA ROSSBACH MRN: [id number] Date of Birth: 1946/07/04

## 2021-08-26 ENCOUNTER — Encounter: Payer: Self-pay | Admitting: Occupational Therapy

## 2021-08-26 ENCOUNTER — Other Ambulatory Visit: Payer: Self-pay

## 2021-08-26 ENCOUNTER — Ambulatory Visit: Payer: Medicare HMO | Admitting: Occupational Therapy

## 2021-08-26 ENCOUNTER — Ambulatory Visit: Payer: Medicare HMO

## 2021-08-26 DIAGNOSIS — I69118 Other symptoms and signs 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: Secondary | ICD-10-CM

## 2021-08-26 DIAGNOSIS — R41842 Visuospatial deficit: Secondary | ICD-10-CM

## 2021-08-26 DIAGNOSIS — R41841 Cognitive communication deficit: Secondary | ICD-10-CM

## 2021-08-26 DIAGNOSIS — R41840 Attention and concentration deficit: Secondary | ICD-10-CM

## 2021-08-26 DIAGNOSIS — R2681 Unsteadiness on feet: Secondary | ICD-10-CM

## 2021-08-26 NOTE — Therapy (Signed)
[facility] [address] [place], [place], [address] Phone: [phone]   Fax:  [phone]  Speech Language Pathology Treatment  Patient Details  Name: Tonya [name] MRN: [id number] Date of Birth: [birth year] Referring Provider (SLP): Charlett Blake, MD   Encounter Date: 08/26/2021   End of Session - 08/26/21 0838     Visit Number 14    Number of Visits 17    Date for SLP Re-Evaluation 09/05/21    Authorization Type [employer]    Authorization Time Period 6 SLP visits 07/29/2021 - 09/06/2021    Authorization - Visit Number 5    Authorization - Number of Visits 6    SLP Start Time 5638   finishing OT eval   SLP Stop Time  0930    SLP Time Calculation (min) 38 min    Activity Tolerance Patient tolerated treatment well             Past Medical History:  Diagnosis Date   Adenoma of left adrenal gland    Cancer ([place])    Lung   Colon polyps    hyperplastic   Complication of anesthesia    Very emotional and cries after anesthesia   GERD (gastroesophageal reflux disease)    High blood pressure 03/25/2021   Kidney stone    Liver lesion    Microhematuria    Stroke [facility])     Past Surgical History:  Procedure Laterality Date   ABDOMINAL HYSTERECTOMY     APPENDECTOMY     BRONCHIAL BIOPSY  07/18/2020   Procedure: BRONCHIAL BIOPSIES;  Surgeon: [name], DO;  Location: [facility];  Service: Pulmonary;;   BRONCHIAL BRUSHINGS  07/18/2020   Procedure: BRONCHIAL BRUSHINGS;  Surgeon: [name], DO;  Location: [facility];  Service: Pulmonary;;   BRONCHIAL NEEDLE ASPIRATION BIOPSY  07/18/2020   Procedure: BRONCHIAL NEEDLE ASPIRATION BIOPSIES;  Surgeon: [name], DO;  Location: [place] [facility];  Service: Pulmonary;;   BRONCHIAL WASHINGS  07/18/2020   Procedure: BRONCHIAL WASHINGS;  Surgeon: [name], DO;  Location: [place] [facility];  Service: Pulmonary;;   FIDUCIAL  MARKER PLACEMENT  07/18/2020   Procedure: FIDUCIAL MARKER PLACEMENT;  Surgeon: [name], DO;  Location: [place];  Service: Pulmonary;;   INTERCOSTAL NERVE BLOCK Right 08/26/2020   Procedure: INTERCOSTAL NERVE BLOCK;  Surgeon: [name], MD;  Location: [place] [facility];  Service: Thoracic;  Laterality: Right;   IR THORACENTESIS ASP PLEURAL SPACE W/IMG GUIDE  10/10/2020   NODE DISSECTION Right 08/26/2020   Procedure: NODE DISSECTION;  Surgeon: [name], MD;  Location: [facility];  Service: Thoracic;  Laterality: Right;   THORACENTESIS Right 11/04/2020   Procedure: Mathews Robinsons;  Surgeon: [name], DO;  Location: [place] [facility];  Service: Pulmonary;  Laterality: Right;   THORACENTESIS Right 11/29/2020   Procedure: THORACENTESIS;  Surgeon: [name], DO;  Location: [facility];  Service: Pulmonary;  Laterality: Right;   VIDEO BRONCHOSCOPY WITH ENDOBRONCHIAL NAVIGATION N/A 07/18/2020   Procedure: VIDEO BRONCHOSCOPY WITH ENDOBRONCHIAL NAVIGATION;  Surgeon: [name], DO;  Location: [facility];  Service: Pulmonary;  Laterality: N/A;   VIDEO BRONCHOSCOPY WITH ENDOBRONCHIAL ULTRASOUND  07/18/2020   Procedure: VIDEO BRONCHOSCOPY WITH ENDOBRONCHIAL ULTRASOUND;  Surgeon: [name], DO;  Location: [place] [facility];  Service: Pulmonary;;    There were no vitals filed for this visit.   Subjective Assessment - 08/26/21 0914     Subjective "It's okay"    Currently  in Pain? Yes    Pain Score 5     Pain Location Leg                   ADULT SLP TREATMENT - 08/26/21 0837       General Information   Behavior/Cognition Alert;Cooperative;Pleasant mood      Treatment Provided   Treatment provided Cognitive-Linquistic      Cognitive-Linquistic Treatment   Treatment focused on Cognition;Patient/family/caregiver education    Skilled Treatment Pt verbally recapped OT evaluation only as pt is at baseline. Pt has implemented daily journal at home as pt  expressed concern with short term recall of daily tasks. SLP provided recommendations to aid recall and attention to optimize daily functioning. Handout provided. SLP targeted recall of written detailed paragraphs, in which pt averaged 80% accuracy. Pt identified errors independently on second repetition. Pt benefited from cues for slow rate to aid attention and processing.      Assessment / Recommendations / Plan   Plan Continue with current plan of care     Progression Toward Goals   Progression toward goals Progressing toward goals              SLP Education - 08/26/21 0915     Education Details recommendations    Person(s) Educated Patient    Methods Explanation;Demonstration;Handout    Comprehension Verbalized understanding;Returned demonstration              SLP Short Term Goals - 08/21/21 1251       SLP SHORT TERM GOAL #1   Title Pt will use memory compensations for appointments, finances, and other daily activities with rare min A  over 2 sessions    Baseline 06-23-21, 07-01-21    Status Achieved      SLP SHORT TERM GOAL #2   Title Pt will use verbalize and demo accurate problem solving for functional scenarios encountered at home with rare min A over 2 sessions    Baseline 07-01-21, 07-04-21    Status Achieved      SLP SHORT TERM GOAL #3   Title Pt will ID and correct errors on structured cognitive tasks with 80% accuracy given rare min A over 2 sessions    Baseline 07-08-21    Status Partially Met      SLP SHORT TERM GOAL #4   Title Pt will implement strategy to ensure stove is turned off after cooking for 3/3 opportunities    Baseline 06-23-21, 07-01-21, 07-04-21    Status Achieved      SLP SHORT TERM GOAL #5   Title Pt will complete cognitive PROM in first few sessions    Status Achieved              SLP Long Term Goals - 08/26/21 0839       SLP LONG TERM GOAL #1   Title Pt will independently manage appointments and bill pay with use of  compensations with no errors reported for 3/3 opportunities    Status Achieved      SLP LONG TERM GOAL #2   Title Pt will use independently demo accurate problem solving for functional scenarios encountered at home over 2 sessions    Baseline 08-06-21    Status Partially Met      SLP LONG TERM GOAL #3   Title Pt will report improved cognitive linguistic functioning on PROM by 5 points at last ST session    Time 4    Period Weeks  Status On-going      Target Date 09/05/21      SLP LONG TERM GOAL #4   Title Pt will independently identify any errors on structured or unstructured cognitive tasks to be 90% accurate given rare min A over 2 sessions    Baseline 08-22-21, 08-26-21    Time 4    Period Weeks    Status Achieved    Target Date 09/05/21              Plan - 08/26/21 1062     Clinical Impression Statement "Tonya [name]" was referred for OPST to address cognition secondary to 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 Overall improving cognition reported by patient. Good carryover of SLP recommendations and strategies indicated. SLP provided further recommendations to aid recall and attention to optimize daily functioning. Pt will continue daily journaling at home to aid daily recall. SLP recommends skilled ST intervention to address cognitive linguistic skills to maximize return to PLOF and increase functional independence.    Speech Therapy Frequency 2x / week    Duration 8 weeks    Treatment/Interventions Patient/family education;Functional tasks;Cognitive reorganization;Compensatory techniques;Internal/external aids;SLP instruction and feedback;Environmental controls;Cueing hierarchy;Compensatory strategies    Potential to Achieve Goals Good    SLP Home Exercise Plan provided    Consulted and Agree with Plan of Care Patient;Family member/caregiver             Patient will benefit from skilled therapeutic intervention in order to improve the following deficits and impairments:    Cognitive communication deficit    Problem List Patient Active Problem List   Diagnosis Date Noted   Blurred vision 08/16/2021   Migraine 08/16/2021   Swelling of both lower extremities 05/08/2021   Frequent headaches 03/25/2021   Sciatica of left side 03/25/2021   Chronic low back pain 03/25/2021   Anxiety disorder 03/25/2021   Intraparenchymal hematoma of brain 02/27/2021   Stroke, 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   Intracranial hemorrhage ([place]) 02/22/2021   Chemotherapy induced neutropenia ([place]) 01/08/2021   Bradycardia 01/01/2021   Hypomagnesemia 12/10/2020   Nausea without vomiting 12/10/2020   Status post thoracentesis    Pleural effusion, right 11/01/2020   Adenocarcinoma of right lung, stage 2 ([place]) 09/11/2020   Encounter for antineoplastic chemotherapy 09/11/2020   S/P Robotic Assited Video Thoracoscopy with Right Upper Lobectomy Lung 08/26/2020   Mediastinal adenopathy 07/18/2020   Lung nodules    Lung nodule 06/14/2020   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   Tobacco dependence 05/29/2020   Exertional chest pain 05/28/2020   Numbness and tingling of left arm and leg 07/01/2012   Hyperlipidemia with target low density lipoprotein (LDL) cholesterol less than 100 mg/dL 07/01/2012   Chest pressure 06/30/2012   GERD (gastroesophageal reflux disease) 06/30/2012    Alinda Deem, CCC-SLP 08/26/2021, 9:30 AM  [facility] [address] [place], [place], [address] Phone: [phone]   Fax:  [phone]   Name: Tonya [name] MRN: [id number] Date of Birth: 04/02/1946

## 2021-08-26 NOTE — Patient Instructions (Signed)
° ° ° °  Activities for Cognition:  1. Jigsaw puzzles 2. Card/board games 3. Talking on the phone/social events 4. [email or web address] or [email or web address] 5. Online games 6. Word serches/crossword puzzles/Sudoku 7.  Logic puzzles 8. Try something new--new recipe, hobby 9. Crafts 10. Do a variety of activities that are challenging 11. Add cognitive activities to walking/exercising (think of animal/food/city with each letter of the alphabet, counting backwards, thinking of as many vegetables as you can, etc.).--Only do this If safe (no falls). 12.  Make a list of tasks to complete when doing a project/running errands and check things off as you go 13.  Plan out meals for the week and write a grocery list

## 2021-08-26 NOTE — Therapy (Signed)
[facility] [address] [place], [place], [address] Phone: [phone]   Fax:  [phone]  Occupational Therapy Evaluation  Patient Details  Name: Tonya [name] MRN: [id number] Date of Birth: August 24, 1946 Referring Provider (OT): Dr. Alysia Penna (referred this time by Dr. Lalla Brothers)   Encounter Date: 08/26/2021   OT End of Session - 08/26/21 1347     Visit Number 1    Number of Visits 1    Date for OT Re-Evaluation --   eval only   Authorization Type [employer]    OT Start Time 0800    OT Stop Time 0845    OT Time Calculation (min) 45 min    Activity Tolerance Patient tolerated treatment well    Behavior During Therapy [facility] for tasks assessed/performed             Past Medical History:  Diagnosis Date   Adenoma of left adrenal gland    Cancer ([place])    Lung   Colon polyps    hyperplastic   Complication of anesthesia    Very emotional and cries after anesthesia   GERD (gastroesophageal reflux disease)    High blood pressure 03/25/2021   Kidney stone    Liver lesion    Microhematuria    Stroke [facility])     Past Surgical History:  Procedure Laterality Date   ABDOMINAL HYSTERECTOMY     APPENDECTOMY     BRONCHIAL BIOPSY  07/18/2020   Procedure: BRONCHIAL BIOPSIES;  Surgeon: [name], DO;  Location: [facility] [facility];  Service: Pulmonary;;   BRONCHIAL BRUSHINGS  07/18/2020   Procedure: BRONCHIAL BRUSHINGS;  Surgeon: [name], DO;  Location: [facility];  Service: Pulmonary;;   BRONCHIAL NEEDLE ASPIRATION BIOPSY  07/18/2020   Procedure: BRONCHIAL NEEDLE ASPIRATION BIOPSIES;  Surgeon: [name], DO;  Location: [facility] [facility];  Service: Pulmonary;;   BRONCHIAL WASHINGS  07/18/2020   Procedure: BRONCHIAL WASHINGS;  Surgeon: [name], DO;  Location: [place] [facility];  Service: Pulmonary;;   FIDUCIAL MARKER PLACEMENT  07/18/2020   Procedure: FIDUCIAL MARKER  PLACEMENT;  Surgeon: [name], DO;  Location: [place] [facility];  Service: Pulmonary;;   INTERCOSTAL NERVE BLOCK Right 08/26/2020   Procedure: INTERCOSTAL NERVE BLOCK;  Surgeon: [name], MD;  Location: [place];  Service: Thoracic;  Laterality: Right;   IR THORACENTESIS ASP PLEURAL SPACE W/IMG GUIDE  10/10/2020   NODE DISSECTION Right 08/26/2020   Procedure: NODE DISSECTION;  Surgeon: [name], MD;  Location: [place];  Service: Thoracic;  Laterality: Right;   THORACENTESIS Right 11/04/2020   Procedure: Mathews Robinsons;  Surgeon: [name], DO;  Location: [facility];  Service: Pulmonary;  Laterality: Right;   THORACENTESIS Right 11/29/2020   Procedure: THORACENTESIS;  Surgeon: [name], DO;  Location: [facility];  Service: Pulmonary;  Laterality: Right;   VIDEO BRONCHOSCOPY WITH ENDOBRONCHIAL NAVIGATION N/A 07/18/2020   Procedure: VIDEO BRONCHOSCOPY WITH ENDOBRONCHIAL NAVIGATION;  Surgeon: [name], DO;  Location: [facility];  Service: Pulmonary;  Laterality: N/A;   VIDEO BRONCHOSCOPY WITH ENDOBRONCHIAL ULTRASOUND  07/18/2020   Procedure: VIDEO BRONCHOSCOPY WITH ENDOBRONCHIAL ULTRASOUND;  Surgeon: [name], DO;  Location: [place] [facility];  Service: Pulmonary;;    There were no vitals filed for this visit.   Subjective Assessment - 08/26/21 0802     Subjective  Pt reports severe headache while at [facility], came home at went to the hospital 08/15/21.  Reoccurance of blurry  vision and headache and balance deficits ("husband had to almost carry me.  I was bumping into things and knocking things over").  Pt reports that she feels like vision is back to 90% of what it was prior to recent hospitalization    Pertinent History hx of R parietal lobe ICH and R frontal [facility] 02/22/21 with likely recrudenscence of prior stroke symptoms with hospitalization 12/9.    PMH includes: lung cancer s/p R upper lobectomy, currently undergoing chemo, GERD, HLD,  bradycardia    Limitations fall risk, L inattention    Patient Stated Goals improve memory and balance    Currently in Pain? No/denies    Pain Score 5     Pain Location Leg    Pain Orientation Right    Pain Descriptors / Indicators Burning;Discomfort    Pain Type Chronic pain    Pain Onset More than a month ago    Pain Frequency Constant    Aggravating Factors  unsure    Pain Relieving Factors unsure               [facility] OT Assessment - 08/26/21 0001       Assessment   Medical Diagnosis recrudenscence of prior stroke symptoms    Referring Provider (OT) Dr. Alysia Penna (referred this time by Dr. Lalla Brothers)    Onset Date/Surgical Date 08/15/21    Hand Dominance Right    Prior Therapy previous OT d/c 08/06/21      Precautions   Precautions Fall    Precaution Comments No driving      Balance Screen   Has the patient fallen in the past 6 months No      Home  Environment   Family/patient expects to be discharged to: Private residence    Lives With Spouse      Prior Function   Level of [place] Retired    Leisure travel, read, entertain, spend time with family      ADL   ADL comments Pt reports performing BADLs independently      IADL   Prior Level of Research scientist (life sciences) for transportation    Prior Level of Function Light Housekeeping independent    Light Housekeeping --   performing simple tasks, straightening, sweeping, dishes, etc (husband has been mopping/vacuuming)   Prior Level of Function Meal Prep independent    Meal Prep --   back to cooking now   Prior Level of Function Community Mobility independent prior to initial stroke    Education officer, environmental on family or friends for transportation    Prior Level of Function Medication Managment independent    Medication Management Is responsible for taking medication in correct dosages at correct time    Prior Level of Function Financial  Management independent    Financial Management --   performing with husband (overpaid once during new episode)     Mobility   Mobility Status Independent    Mobility Status Comments --   pt reports that balance isn't what it was when previously d/c'd     Vision - History   Baseline Vision Wears glasses all the time    Additional Comments 6M Visual scanning/number cancellation with incr time and 100% accuracy.  Environmental scanning in mod busy environment with 16/17 items found (94% accuracy), remaining item found quickly on 2nd pass without cueing.  Trail making A with good accuracy/time.  Trail making B with good accuracy.  Pt reports that she has returned to grocery shopping and is no longer bumping into things at home like she was during recent hospitalization..      Cognition   Overall Cognitive Status Impaired/Different from baseline    Area of Impairment Memory    Memory Decreased short-term memory   per pt, has been journaling   Cognition Comments Pt continues to need incr time for cognitive and visual tasks.  Pt reports that she continues to miss words on L side at times, but rechecks if passage doesn't make sense.      Coordination   9 Hole Peg Test Right;Left    Right 9 Hole Peg Test 24.68    Left 9 Hole Peg Test 28.85      AROM   Overall AROM  Within functional limits for tasks performed    Overall AROM Comments BUEs      Strength   Overall Strength Within functional limits for tasks performed    Overall Strength Comments BUE proximal strength 5/5      Hand Function   Right Hand Grip (lbs) 40.1    Left Hand Grip (lbs) 27.7                              OT Education - 08/26/21 1410     Education Details Discussed eval results and plan.  Reviewed/updated HEP for cognition/vision--see pt instructions.    Person(s) Educated Patient    Methods Explanation;Handout    Comprehension Verbalized understanding              OT Short Term Goals -  08/26/21 1411       OT SHORT TERM GOAL #1   Title n/a               OT Long Term Goals - 08/26/21 1411       OT LONG TERM GOAL #1   Title n/a                   Plan - 08/26/21 1348     Clinical Impression Statement Pt is a 75 y.o. female who was hospitalized 08/15/21 with stroke-like symptoms including vision changes, balance deficits, L sided weakness, and impulsivity).  MRI was negative for changes.  Per Epic notes, likely recrudescence of prior stroke symptoms.  Pt with prior hx of R parietal lobe ICH and R frontal SAH 02/22/21 and was seen for outpatient rehab/OT 05/29/21-08/06/21 (discharge) and therapist is familiar with pt from that episode of care.  Pt reports that symptoms have improved but that she is still is having difficulty with balance and memory.  Pt has returned to all BADLs, cooking, cleaning, and grocery shopping (with supervision).  Pt reports improvement in vision and that she is not bumping into things any longer.  Pt performed visual scanning/environmental scanning today at same level as when discharged from occupational therapy 08/06/21 prior to recent hospitalization.  Pt is scheduled for upcoming PT eval to assess for balance changes and continues with speech therapy to address cognitive/memory deficits.  No further occupational therapy is recommended at this time. Reviewed cognitive/vision HEP and re-issued.  Pt verbalized understanding/agreement with plan.    OT Occupational Profile and History Detailed Assessment- Review of Records and additional review of physical, cognitive, psychosocial history related to current functional performance    Occupational performance deficits (Please refer to evaluation for details): IADL's;ADL's    Body Structure / Function /  Physical Skills Arboriculturist;Attention;Problem Solve;Safety Awareness;Sequencing    Clinical Decision Making Limited treatment options, no task modification necessary     Comorbidities Affecting Occupational Performance: May have comorbidities impacting occupational performance    Modification or Assistance to Complete Evaluation  No modification of tasks or assist necessary to complete eval    OT Frequency --   eval only   OT Treatment/Interventions Self-care/ADL training;Cognitive remediation/compensation;Patient/family education;Visual/perceptual remediation/compensation    Plan no futher occupational therapy at this time    Consulted and Agree with Plan of Care Patient             Patient will benefit from skilled therapeutic intervention in order to improve the following deficits and impairments:   Body Structure / Function / Physical Skills: Balance, IADL Cognitive Skills: Memory, Attention, Problem Solve, Safety Awareness, Sequencing     Visit Diagnosis: Visuospatial deficit  Attention and concentration deficit  Unsteadiness on feet  Other symptoms and signs 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    Problem List Patient Active Problem List   Diagnosis Date Noted   Blurred vision 08/16/2021   Migraine 08/16/2021   Swelling of both lower extremities 05/08/2021   Frequent headaches 03/25/2021   Sciatica of left side 03/25/2021   Chronic low back pain 03/25/2021   Anxiety disorder 03/25/2021   Intraparenchymal hematoma of brain 02/27/2021   Stroke, 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[place]) 02/23/2021   Intracranial hemorrhage ([facility]) 02/22/2021   Chemotherapy induced neutropenia ([place]) 01/08/2021   Bradycardia 01/01/2021   Hypomagnesemia 12/10/2020   Nausea without vomiting 12/10/2020   Status post thoracentesis    Pleural effusion, right 11/01/2020   Adenocarcinoma of right lung, stage 2 ([place]) 09/11/2020   Encounter for antineoplastic chemotherapy 09/11/2020   S/P Robotic Assited Video Thoracoscopy with Right Upper Lobectomy Lung 08/26/2020   Mediastinal adenopathy 07/18/2020   Lung nodules    Lung nodule  06/14/2020   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   Tobacco dependence 05/29/2020   Exertional chest pain 05/28/2020   Numbness and tingling of left arm and leg 07/01/2012   Hyperlipidemia with target low density lipoprotein (LDL) cholesterol less than 100 mg/dL 07/01/2012   Chest pressure 06/30/2012   GERD (gastroesophageal reflux disease) 06/30/2012    Vianne Bulls, OT 08/26/2021, 2:15 PM  [facility] [address] [place], [place], [address] Phone: [phone]   Fax:  [phone]  Name: Tonya [name] MRN: [id number] Date of Birth: 01-14-1946  Vianne Bulls, OTR/L [facility] [address] [place], [place]  [address] [phone] phone [phone] 08/26/21 2:15 PM

## 2021-08-26 NOTE — Patient Instructions (Addendum)
Continue your daily journal. Bring in your daily journal for our last session   Take pictures to aid your memory.  Remember, repetition is important for your memory. If you only see or read something once, it may be harder to remember.   Memory and attention work together. If you are not focused on something, it will not stand out in your memory.   Read something in entirety before you begin working on it.  Think about slowing down. There is no need to rush through everything.

## 2021-08-28 ENCOUNTER — Ambulatory Visit: Payer: Medicare HMO

## 2021-08-28 ENCOUNTER — Telehealth: Payer: Self-pay | Admitting: Internal Medicine

## 2021-08-28 ENCOUNTER — Other Ambulatory Visit: Payer: Self-pay

## 2021-08-28 DIAGNOSIS — R41841 Cognitive communication deficit: Secondary | ICD-10-CM

## 2021-08-28 DIAGNOSIS — R41842 Visuospatial deficit: Secondary | ICD-10-CM | POA: Diagnosis not present

## 2021-08-28 NOTE — Therapy (Signed)
[facility] [address] [place], [place], [address] Phone: [phone]   Fax:  [phone]  Speech Language Pathology Treatment/Discharge  Patient Details  Name: Tonya [name] MRN: [id number] Date of Birth: [birth year] Referring Provider (SLP): Charlett Blake, MD   Encounter Date: 08/28/2021   End of Session - 08/28/21 1022     Visit Number 15    Number of Visits 17    Date for SLP Re-Evaluation 09/05/21    Authorization Type [employer]    Authorization Time Period 6 SLP visits 07/29/2021 - 09/06/2021    Authorization - Visit Number 6    Authorization - Number of Visits 6    SLP Start Time 2706    SLP Stop Time  1048    SLP Time Calculation (min) 30 min    Activity Tolerance Patient tolerated treatment well             Past Medical History:  Diagnosis Date   Adenoma of left adrenal gland    Cancer ([place])    Lung   Colon polyps    hyperplastic   Complication of anesthesia    Very emotional and cries after anesthesia   GERD (gastroesophageal reflux disease)    High blood pressure 03/25/2021   Kidney stone    Liver lesion    Microhematuria    Stroke [facility])     Past Surgical History:  Procedure Laterality Date   ABDOMINAL HYSTERECTOMY     APPENDECTOMY     BRONCHIAL BIOPSY  07/18/2020   Procedure: BRONCHIAL BIOPSIES;  Surgeon: [name], DO;  Location: [facility];  Service: Pulmonary;;   BRONCHIAL BRUSHINGS  07/18/2020   Procedure: BRONCHIAL BRUSHINGS;  Surgeon: [name], DO;  Location: [facility];  Service: Pulmonary;;   BRONCHIAL NEEDLE ASPIRATION BIOPSY  07/18/2020   Procedure: BRONCHIAL NEEDLE ASPIRATION BIOPSIES;  Surgeon: [name], DO;  Location: [place] [facility];  Service: Pulmonary;;   BRONCHIAL WASHINGS  07/18/2020   Procedure: BRONCHIAL WASHINGS;  Surgeon: [name], DO;  Location: [facility] [place] [facility];  Service: Pulmonary;;   FIDUCIAL MARKER  PLACEMENT  07/18/2020   Procedure: FIDUCIAL MARKER PLACEMENT;  Surgeon: [name], DO;  Location: [place] [facility];  Service: Pulmonary;;   INTERCOSTAL NERVE BLOCK Right 08/26/2020   Procedure: INTERCOSTAL NERVE BLOCK;  Surgeon: [name], MD;  Location: [facility];  Service: Thoracic;  Laterality: Right;   IR THORACENTESIS ASP PLEURAL SPACE W/IMG GUIDE  10/10/2020   NODE DISSECTION Right 08/26/2020   Procedure: NODE DISSECTION;  Surgeon: [name], MD;  Location: [facility];  Service: Thoracic;  Laterality: Right;   THORACENTESIS Right 11/04/2020   Procedure: [facility];  Surgeon: [name], DO;  Location: [facility];  Service: Pulmonary;  Laterality: Right;   THORACENTESIS Right 11/29/2020   Procedure: THORACENTESIS;  Surgeon: [name], DO;  Location: [facility];  Service: Pulmonary;  Laterality: Right;   VIDEO BRONCHOSCOPY WITH ENDOBRONCHIAL NAVIGATION N/A 07/18/2020   Procedure: VIDEO BRONCHOSCOPY WITH ENDOBRONCHIAL NAVIGATION;  Surgeon: [name], DO;  Location: [facility];  Service: Pulmonary;  Laterality: N/A;   VIDEO BRONCHOSCOPY WITH ENDOBRONCHIAL ULTRASOUND  07/18/2020   Procedure: VIDEO BRONCHOSCOPY WITH ENDOBRONCHIAL ULTRASOUND;  Surgeon: [name], DO;  Location: [facility];  Service: Pulmonary;;    There were no vitals filed for this visit.   Subjective Assessment - 08/28/21 1018     Subjective "it's been helpful" re: daily journaling    Currently  in Pain? Yes    Pain Score 4     Pain Location Fl[place]             SPEECH THERAPY DISCHARGE SUMMARY  Visits from Start of Care: 15  Current functional level related to goals / functional outcomes: [place] presents with improvements for cognitive linguistic skills for memory, attention, and executive functioning. Pt exhibits increased awareness, improved problem solving, and increased attention. Pt benefits from external memory aids for short term recall. Pt is pleased with  current progress and agreeable to ST discharge on last scheduled date.    Remaining deficits: NA   Education / Equipment: Compensatory training and education, functional applications, caregiver edu   Patient agrees to discharge. Patient goals were met. Patient is being discharged due to meeting the stated rehab goals..      ADULT SLP TREATMENT - 08/28/21 1020       General Information   Behavior/Cognition Alert;Cooperative;Pleasant mood      Treatment Provided   Treatment provided Cognitive-Linquistic      Cognitive-Linquistic Treatment   Treatment focused on Cognition;Patient/family/caregiver education    Skilled Treatment Pt returned with daily journal. Pt reported suspected benefit of using specific information. Good recall of specific information exhibited when discussing each day. Effective alternating attention exhibited with side conversations. SLP reviewed previous memory and attention compensations targeted throughout ST intervention. SLP re-completed PROM from last session, with improved scores exhibited. Pt is pleased with current progress and agreeable to ST discharge on last scheduled day.      Assessment / Recommendations / Plan   Plan Discharge SLP treatment due to (comment)   ST goals met     Progression Toward Goals   Progression toward goals Goals met, education completed, patient discharged from [facility] Education - 08/28/21 1021     Education Details discharge summary, recommendations    Person(s) Educated Patient    Methods Explanation;Demonstration    Comprehension Verbalized understanding;Returned demonstration              SLP Short Term Goals - 08/21/21 1251       SLP SHORT TERM GOAL #1   Title Pt will use memory compensations for appointments, finances, and other daily activities with rare min A  over 2 sessions    Baseline 06-23-21, 07-01-21    Status Achieved      SLP SHORT TERM GOAL #2   Title Pt will use verbalize  and demo accurate problem solving for functional scenarios encountered at home with rare min A over 2 sessions    Baseline 07-01-21, 07-04-21    Status Achieved      SLP SHORT TERM GOAL #3   Title Pt will ID and correct errors on structured cognitive tasks with 80% accuracy given rare min A over 2 sessions    Baseline 07-08-21    Status Partially Met      SLP SHORT TERM GOAL #4   Title Pt will implement strategy to ensure stove is turned off after cooking for 3/3 opportunities    Baseline 06-23-21, 07-01-21, 07-04-21    Status Achieved      SLP SHORT TERM GOAL #5   Title Pt will complete cognitive PROM in first few sessions    Status Achieved              SLP Long Term Goals - 08/28/21 1022       SLP LONG  TERM GOAL #1   Title Pt will independently manage appointments and bill pay with use of compensations with no errors reported for 3/3 opportunities    Status Achieved      SLP LONG TERM GOAL #2   Title Pt will use independently demo accurate problem solving for functional scenarios encountered at home over 2 sessions    Baseline 08-06-21    Status Partially Met      SLP LONG TERM GOAL #3   Title Pt will report improved cognitive linguistic functioning on PROM by 5 points at last ST session    Status Achieved        SLP LONG TERM GOAL #4   Title Pt will independently identify any errors on structured or unstructured cognitive tasks to be 90% accurate given rare min A over 2 sessions    Baseline 08-22-21, 08-26-21    Status Achieved              Plan - 08/28/21 1040     Clinical Impression Statement "Ernie" was referred for OPST to address cognition secondary to 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 Overall improved cognition reported by patient. Good carryover of SLP recommendations and strategies indicated. Pt will continue daily journaling at home to aid daily recall. Pt has met all ST goals and is pleased with current progress. Discharge completed today. Pt  aware she may request additional referral if any changes or decline exhibited.    Speech Therapy Frequency 2x / week    Duration 8 weeks    Treatment/Interventions Patient/family education;Functional tasks;Cognitive reorganization;Compensatory techniques;Internal/external aids;SLP instruction and feedback;Environmental controls;Cueing hierarchy;Compensatory strategies    Potential to Achieve Goals Good    SLP Home Exercise Plan provided    Consulted and Agree with Plan of Care Patient;Family member/caregiver             Patient will benefit from skilled therapeutic intervention in order to improve the following deficits and impairments:   Cognitive communication deficit    Problem List Patient Active Problem List   Diagnosis Date Noted   Blurred vision 08/16/2021   Migraine 08/16/2021   Swelling of both lower extremities 05/08/2021   Frequent headaches 03/25/2021   Sciatica of left side 03/25/2021   Chronic low back pain 03/25/2021   Anxiety disorder 03/25/2021   Intraparenchymal hematoma of brain 02/27/2021   Stroke, 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   Intracranial hemorrhage ([place]) 02/22/2021   Chemotherapy induced neutropenia ([place]) 01/08/2021   Bradycardia 01/01/2021   Hypomagnesemia 12/10/2020   Nausea without vomiting 12/10/2020   Status post thoracentesis    Pleural effusion, right 11/01/2020   Adenocarcinoma of right lung, stage 2 ([place]) 09/11/2020   Encounter for antineoplastic chemotherapy 09/11/2020   S/P Robotic Assited Video Thoracoscopy with Right Upper Lobectomy Lung 08/26/2020   Mediastinal adenopathy 07/18/2020   Lung nodules    Lung nodule 06/14/2020   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   Tobacco dependence 05/29/2020   Exertional chest pain 05/28/2020   Numbness and tingling of left arm and leg 07/01/2012   Hyperlipidemia with target low density lipoprotein (LDL) cholesterol less than 100 mg/dL 07/01/2012   Chest pressure 06/30/2012   GERD (gastroesophageal  reflux disease) 06/30/2012    Alinda Deem, CCC-SLP 08/28/2021, 10:41 AM  [facility] [address] [place], [place], [address] Phone: [phone]   Fax:  [phone]   Name: TRENISHA LAFAVOR MRN: [id number] Date of Birth: Apr 18, 1946

## 2021-08-28 NOTE — Telephone Encounter (Signed)
Sch per 12/13 los, pt aware

## 2021-09-10 ENCOUNTER — Ambulatory Visit (INDEPENDENT_AMBULATORY_CARE_PROVIDER_SITE_OTHER): Payer: Medicare HMO | Admitting: Adult Health

## 2021-09-10 ENCOUNTER — Other Ambulatory Visit: Payer: Self-pay

## 2021-09-10 ENCOUNTER — Ambulatory Visit: Payer: Medicare HMO | Attending: Physical Medicine & Rehabilitation

## 2021-09-10 ENCOUNTER — Encounter: Payer: Self-pay | Admitting: Adult Health

## 2021-09-10 VITALS — BP 114/73 | HR 54 | Ht 64.0 in | Wt 147.6 lb

## 2021-09-10 DIAGNOSIS — R2990 Unspecified symptoms and signs involving the nervous system: Secondary | ICD-10-CM | POA: Diagnosis not present

## 2021-09-10 DIAGNOSIS — S06310S Contusion and laceration of right cerebrum without loss of consciousness, sequela: Secondary | ICD-10-CM

## 2021-09-10 DIAGNOSIS — H538 Other visual disturbances: Secondary | ICD-10-CM | POA: Insufficient documentation

## 2021-09-10 DIAGNOSIS — G43809 Other migraine, not intractable, without status migrainosus: Secondary | ICD-10-CM | POA: Diagnosis not present

## 2021-09-10 DIAGNOSIS — R2681 Unsteadiness on feet: Secondary | ICD-10-CM | POA: Insufficient documentation

## 2021-09-10 DIAGNOSIS — M6281 Muscle weakness (generalized): Secondary | ICD-10-CM | POA: Diagnosis present

## 2021-09-10 DIAGNOSIS — G8929 Other chronic pain: Secondary | ICD-10-CM | POA: Diagnosis present

## 2021-09-10 DIAGNOSIS — M5441 Lumbago with sciatica, right side: Secondary | ICD-10-CM | POA: Insufficient documentation

## 2021-09-10 NOTE — Progress Notes (Signed)
[facility] [address] [place]. [place] [address] [phone]       HOSPITAL FOLLOW UP NOTE  [name] Date of Birth:  [birth year] Medical Record Number:  [id number]   Reason for Referral:  hospital stroke follow up    SUBJECTIVE:   CHIEF COMPLAINT:  Chief Complaint  Patient presents with   Hematoma of brain    Rm 3, 3 month FU  "in hospital early Dec for headache I couldn't get rid of; doing well since"    HPI:   Update 09/10/2021 JM: Patient returns for 54-month stroke follow-up.  Presented to ED on 12/9 with headache, OS visual loss, disdiadockokinesia and impaired coordination likely due to recrudescence of prior right parietal ICH vs complicated migraine. MR brain negative for acute infarct or findings.  CTA head/neck unremarkable. GCA normal CRP and minimally elevated ESR. 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 HA tx'd with IV Toradol and Phenergan with improvement in visual acuity return back to baseline on day of discharge.  PT/OT eval with mild gait imbalance and referred to outpatient PT/OT.  Stable since that time without new, reoccurring or worsening stroke/TIA symptoms.     Evaluated by PT today Completed SLP 12/22 Completed OT 12/20     History provided for reference purposes only Initial visit 06/11/2021 JM: Tonya [name] is being seen for hospital follow-up accompanied by her husband, Tonya [name].   Cognitive concerns - short term memory - at times delayed recall. Able to maintain ADLs independently and has been slowly returning back to IADLs such as cooking and cleaning.  She questions return to driving Imbalance - stumbling.  Gradually improving.  Ambulates without assistive device Left side visual impairment - bumping into things on left side.  Perceptual difficulties.  Question seeing an ophthalmologist Depression/anxiety -struggling with adjusting to her deficits as  she was completely independent prior.  Denies prior history of depression but currently taking Xanax typically only prior to bedtime Denies residual headache - has since discontinued topiramate  Completed home health therapy and started outpatient therapies on 9/22  Blood pressure today 97/60 - does monitor at home and typically 120s-130s/60s.     Stroke admission 02/22/2021 Tonya [name] is a 76 y.o. female w/pmh of GERD, history of bradycardia with ventricular bigeminy, HLD, Stage IIB (T3, N0, M0) non-small cell lung cancer, adenocarcinoma involving the right upper lobe who initially presented on OSH with AMS and on teleneuro exam, patient nonverbal, unable to follow commands, right gaze deviation and generalized but L>R weakness, found to have a right parietal parasaggital bleed for which she transferred to [facility] on 02/22/2021 for further work up and intervention.  Personally reviewed hospitalization pertinent progress notes, lab work and imaging.  Evaluated by Dr. Leonie Man for right parietal IPH with small frontal subarachnoid hemorrhage with etiology likely hypertensive in nature although recommended repeat MRI 2 to 3 months due to unclear exact etiology of bleed. MRV negative for CVST.  No evidence of vascular malformations or mets contributing to bleed.  No prior HTN history of elevated SBP during hospitalization and initiated lisinopril.  EF 60 to 65%.  LDL 101.  A1c 5.4.  Episode of RUE shaking with long-term EEG continuous generalized and right hemispheric slowing but no seizure activity - Keppra 500 mg twice daily initiated for seizure prophylaxis but eventually discontinued due to possible side effect of hallucinations.  Therapy evaluations recommended CIR for residual left-sided weakness, right gaze preference, balance deficits  high-level cognitive deficits and continued hallucinations        PERTINENT IMAGING  08/15/2021 CT HEAD IMPRESSION: No acute abnormality.  Chronic  infarct right parietal lobe.  08/15/2021 MR BRAIN IMPRESSION: No evidence of acute intracranial abnormality.   Redemonstrated chronic cortical/subcortical infarct within the right parietal lobe, with associated chronic blood products.   Otherwise unremarkable non-contrast MRI appearance of the brain.   Right mastoid effusion.   08/15/2021 CTA HEAD/NECK IMPRESSION: 1. Negative for intracranial stenosis or large vessel occlusion 2. No significant carotid or vertebral artery stenosis in the neck. 3. CT head demonstrates no acute abnormality. Chronic infarct right parietal lobe 4. 8 mm spiculated nodule left upper lobe, unchanged from prior chest CT 02/04/2021   02/23/21 CT Head WO IV Contrast 1. Intraparenchymal hematoma centered in the high right parietal lobe with subarachnoid extension over the right convexity and into the basal cisterns. 2. No midline shift or other mass effect.   02/23/21 MR Brain W WO Contrast  Stable appearance of right parietal hemorrhage without evidence of underlying mass lesion or vascular malformation.  Stable small subdural and subarachnoid hemorrhage.  Small amount of blood within the ventricles but no hydrocephalus.   02/23/21 MR Angio Head Neck  WO Contrast  Normal variant MRA of circle of Willis without large vessel stenosis or occlusion or aneurysm.   02/23/21 MR Venogram Head  Normal.  No evidence of venous sinus thrombosis   EEG 6/19 to 6/20  No evidence of seizures      ROS:   14 system review of systems performed and negative with exception of those listed in HPI  PMH:  Past Medical History:  Diagnosis Date   Adenoma of left adrenal gland    Cancer ([place])    Lung   Colon polyps    hyperplastic   Complication of anesthesia    Very emotional and cries after anesthesia   GERD (gastroesophageal reflux disease)    High blood pressure 03/25/2021   Kidney stone    Liver lesion    Microhematuria    Stroke [facility])     PSH:  Past  Surgical History:  Procedure Laterality Date   ABDOMINAL HYSTERECTOMY     APPENDECTOMY     BRONCHIAL BIOPSY  07/18/2020   Procedure: BRONCHIAL BIOPSIES;  Surgeon: [name], DO;  Location: [place] [facility];  Service: Pulmonary;;   BRONCHIAL BRUSHINGS  07/18/2020   Procedure: BRONCHIAL BRUSHINGS;  Surgeon: [name], DO;  Location: [place];  Service: Pulmonary;;   BRONCHIAL NEEDLE ASPIRATION BIOPSY  07/18/2020   Procedure: BRONCHIAL NEEDLE ASPIRATION BIOPSIES;  Surgeon: [name], DO;  Location: [place] [facility];  Service: Pulmonary;;   BRONCHIAL WASHINGS  07/18/2020   Procedure: BRONCHIAL WASHINGS;  Surgeon: [name], DO;  Location: [place] [facility];  Service: Pulmonary;;   FIDUCIAL MARKER PLACEMENT  07/18/2020   Procedure: FIDUCIAL MARKER PLACEMENT;  Surgeon: [name], DO;  Location: [place] [facility];  Service: Pulmonary;;   INTERCOSTAL NERVE BLOCK Right 08/26/2020   Procedure: INTERCOSTAL NERVE BLOCK;  Surgeon: [name], MD;  Location: [place];  Service: Thoracic;  Laterality: Right;   IR THORACENTESIS ASP PLEURAL SPACE W/IMG GUIDE  10/10/2020   NODE DISSECTION Right 08/26/2020   Procedure: NODE DISSECTION;  Surgeon: [name], MD;  Location: [place];  Service: Thoracic;  Laterality: Right;   THORACENTESIS Right 11/04/2020   Procedure: [facility];  Surgeon: [name], DO;  Location: [place] [facility];  Service: Pulmonary;  Laterality: Right;  THORACENTESIS Right 11/29/2020   Procedure: THORACENTESIS;  Surgeon: [name], DO;  Location[facility];  Service: Pulmonary;  Laterality: Right;   VIDEO BRONCHOSCOPY WITH ENDOBRONCHIAL NAVIGATION N/A 07/18/2020   Procedure: VIDEO BRONCHOSCOPY WITH ENDOBRONCHIAL NAVIGATION;  Surgeon: [name], DO;  Location: [place];  Service: Pulmonary;  Laterality: N/A;   VIDEO BRONCHOSCOPY WITH ENDOBRONCHIAL ULTRASOUND  07/18/2020   Procedure: VIDEO BRONCHOSCOPY WITH ENDOBRONCHIAL ULTRASOUND;   Surgeon: [name], DO;  Location: [facility];  Service: Pulmonary;;    Social History:  Social History   Socioeconomic History   Marital status: Married    Spouse name: Not on file   Number of children: 3   Years of education: Not on file   Highest education level: Not on file  Occupational History   Not on file  Tobacco Use   Smoking status: Former    Packs/day: 0.30    Years: 60.00    Pack years: 18.00    Types: Cigarettes    Quit date: 06/11/2020    Years since quitting: 1.2   Smokeless tobacco: Never  Vaping Use   Vaping Use: Never used  Substance and Sexual Activity   Alcohol use: Yes    Comment: 0.5 drink per month   Drug use: No   Sexual activity: Not Currently    Birth control/protection: Surgical    Comment: Hysterectomy  Other Topics Concern   Not on file  Social History Narrative   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    Social Determinants of Health   Financial Resource Strain: Not on file  Food Insecurity: Not on file  Transportation Needs: Not on file  Physical Activity: Not on file  Stress: Not on file  Social Connections: Not on file  Intimate Partner Violence: Not on file    Family History:  Family History  Problem Relation Age of Onset   Arrhythmia Mother        s/p Pacer    Heart attack Father    Heart disease Sister    Colon cancer Neg Hx     Medications:   Current Outpatient Medications on File Prior to Visit  Medication Sig Dispense Refill   acetaminophen (TYLENOL) 325 MG tablet Take 1-2 tablets (325-650 mg total) by mouth every 4 (four) hours as needed for mild pain.     albuterol (VENTOLIN HFA) 108 (90 Base) MCG/ACT inhaler Inhale 2 puffs into the lungs every 6 (six) hours as needed for wheezing or shortness of breath. 8 g 2   alendronate (FOSAMAX) 70 MG tablet Take 70 mg by mouth every Saturday.     ALPRAZolam (XANAX) 1 MG tablet Take 1 mg by mouth at bedtime as needed for sleep.     Calcium Carbonate-Vitamin D (CALCIUM + D PO)  Take 1 tablet by mouth 2 (two) times daily.     lidocaine (LIDODERM) 5 % Place 3 patches onto the skin daily. Apply at 8 am and remove at 8 pm daily. (Patient taking differently: Place 3 patches onto the skin as needed. Apply at 8 am and remove at 8 pm daily.) 90 patch 0   magnesium oxide (MAG-OX) 400 (241.3 Mg) MG tablet Take 1 tablet (400 mg total) by mouth 2 (two) times daily. 60 tablet 1   Menthol-Methyl Salicylate (MUSCLE RUB) 10-15 % CREA Apply 1 application topically 2 (two) times daily. (Patient taking differently: Apply 1 application topically daily as needed for muscle pain.)  0   NARCAN 4 MG/0.1ML LIQD nasal  spray kit 1 spray once.     omeprazole (PRILOSEC) 40 MG capsule Take 40 mg by mouth daily.     oxyCODONE-acetaminophen (PERCOCET) 10-325 MG tablet Take 1 tablet by mouth every 4 (four) hours as needed.     rosuvastatin (CRESTOR) 10 MG tablet Take 10 mg by mouth at bedtime.     umeclidinium-vilanterol (ANORO ELLIPTA) 62.5-25 MCG/INH AEPB Inhale 1 puff into the lungs daily. (Patient taking differently: Inhale 1 puff into the lungs as needed.) 60 each 0   folic acid (FOLVITE) 1 MG tablet TAKE 1 TABLET(1 MG) BY MOUTH DAILY (Patient not taking: Reported on 08/15/2021) 30 tablet 4   HYDROcodone-acetaminophen (NORCO) 10-325 MG tablet Take 1 tablet by mouth every 6 (six) hours as needed for moderate pain. (Patient not taking: Reported on 09/10/2021)     oxyCODONE-acetaminophen (PERCOCET/ROXICET) 5-325 MG tablet Take 1-2 tablets by mouth 2 (two) times daily as needed for severe pain (severe pain). (Patient not taking: Reported on 09/10/2021) 28 tablet 0   No current facility-administered medications on file prior to visit.    Allergies:   Allergies  Allergen Reactions   Other Anaphylaxis    Spiders   Penicillins Hives    REACTION: 20 years   Tizanidine     Significant hypotension      OBJECTIVE:  Physical Exam  Vitals:   09/10/21 0938  BP: 114/73  Pulse: (!) 54  Weight: 147 lb  9.6 oz (67 kg)  Height: $Remove'5\' 4"'MldLyBG$  (1.626 m)    Body mass index is 25.34 kg/m. No results found.  General: well developed, well nourished, very pleasant elderly African-American female, seated, in no evident distress Head: head normocephalic and atraumatic.   Neck: supple with no carotid or supraclavicular bruits Cardiovascular: regular rate and rhythm, no murmurs Musculoskeletal: no deformity Skin:  no rash/petichiae Vascular:  Normal pulses all extremities   Neurologic Exam Mental Status: Awake and fully alert.  Fluent speech and language.  Oriented to place and time. Recent memory impaired and remote memory intact. Attention span, concentration and fund of knowledge appropriate during visit subjectively impaired.  Unable to further assess cognition during visit due to time constraints.  Mood and affect appropriate.  Cranial Nerves: Fundoscopic exam reveals sharp disc margins. Pupils equal, briskly reactive to light. Extraocular movements full without nystagmus. Visual fields full to confrontation (unable to appreciate peripheral visual impairment). Hearing intact. Facial sensation intact. Face, tongue, palate moves normally and symmetrically.  Motor: Normal bulk and tone. Normal strength in all tested extremity muscles except mild decreased left hand dexterity and mild left hip flexor weakness Sensory.: intact to touch , pinprick , position and vibratory sensation.  Coordination: Rapid alternating movements normal in all extremities except slightly decreased left hand. Finger-to-nose and heel-to-shin performed accurately bilaterally. Gait and Station: Arises from chair without difficulty. Stance is normal. Gait demonstrates decreased stride length and step height bilaterally with slow cautious gait without use of assistive device.  Tandem walk and heel toe without great difficulty.  Romberg negative. Reflexes: 1+ and symmetric. Toes downgoing.         ASSESSMENT: Tonya [name]  is a 76 y.o. year old female with right parietal IPH with small frontal subarachnoid hemorrhage of indeterminate etiology on 02/22/2021 after presenting with AMS and recent stroke like episode possibly in setting of recrudescence of prior stroke symptoms vs complicated migraine with worsening balance, OS visual impairment and HA on 08/15/2021. Vascular risk factors include bradycardia likely in setting of ventricular bigeminy, stage  IIB non-small cell lung cancer and adenocarcinoma RUL, and new dx of HTN.      PLAN:  Right IPH with SAH:  Stroke like episode Residual deficit: Mild left hemiparesis, gait impairment with imbalance, cognitive impairment and visual spatial deficit.  Continue working with PT/OT/SLP for likely ongoing recovery.  Referral placed to Dr. Katy Fitch per pt request for continued visual concerns - possible residual left hemianopia unable to appreciate on confrontation testing.  Long discussion regarding typical recovery time.  Long discussion regarding adjustment difficulties with depression/anxiety and hopefully will improve as she continues to work with therapies and gradually returns back to her prior activities -she was advised to follow-up with PCP if symptoms persist.  Would not recommend return to driving at this time until further evaluation with ophthalmology and improvement of cognition Repeat MRI brain w/wo contrast to further assess for possible etiology.   Continue Crestor for secondary stroke prevention.   Discussed secondary stroke prevention measures and importance of close PCP follow up for aggressive stroke risk factor management. I have gone over the pathophysiology of stroke, warning signs and symptoms, risk factors and their management in some detail with instructions to go to the closest emergency room for symptoms of concern. HTN: BP goal <130/90.  New diagnosis during hospitalization.  Stable on nonpharmacological management per PCP HLD: LDL goal <70. Recent LDL  101.  Continue Crestor 10 mg daily managed by PCP     Follow up in 3 months or call earlier if needed   CC:  [place] provider: Dr. Leonie Man PCP: Lin Landsman, MD    I spent 59 minutes of face-to-face and non-face-to-face time with patient and husband.  This included previsit chart review including review of recent hospitalization, lab review, study review, order entry, electronic health record documentation, patient education regarding recent stroke including potential etiology, secondary stroke prevention measures and importance of managing stroke risk factors, residual deficits and typical recovery time and answered all other questions to patient and husband's satisfaction   Frann Rider, AGNP-BC  [facility] [address] [place], [place] [address]  Phone [phone] Fax [phone] Note: This document was prepared with digital dictation and possible smart phrase technology. Any transcriptional errors that result from this process are unintentional.

## 2021-09-10 NOTE — Therapy (Signed)
OUTPATIENT PHYSICAL THERAPY NEURO EVALUATION   Patient Name: Tonya [name] MRN: [id number] DOB:[birth year], 76 y.o., female Today's Date: 09/10/2021  PCP: Lin Landsman, MD REFERRING PROVIDER: Axel Filler, MD    PT End of Session - 09/10/21 0902     Visit Number 1    Number of Visits 16    Date for PT Re-Evaluation 11/05/21    Authorization Type Eval 09/10/20    PT Start Time 0805    PT Stop Time 4403    PT Time Calculation (min) 45 min    Activity Tolerance Patient tolerated treatment well    Behavior During Therapy [facility] for tasks assessed/performed             Past Medical History:  Diagnosis Date   Adenoma of left adrenal gland    Cancer ([place])    Lung   Colon polyps    hyperplastic   Complication of anesthesia    Very emotional and cries after anesthesia   GERD (gastroesophageal reflux disease)    High blood pressure 03/25/2021   Kidney stone    Liver lesion    Microhematuria    Stroke [facility])    Past Surgical History:  Procedure Laterality Date   ABDOMINAL HYSTERECTOMY     APPENDECTOMY     BRONCHIAL BIOPSY  07/18/2020   Procedure: BRONCHIAL BIOPSIES;  Surgeon: [name], DO;  Location: [place] [facility];  Service: Pulmonary;;   BRONCHIAL BRUSHINGS  07/18/2020   Procedure: BRONCHIAL BRUSHINGS;  Surgeon: [name], DO;  Location: [place];  Service: Pulmonary;;   BRONCHIAL NEEDLE ASPIRATION BIOPSY  07/18/2020   Procedure: BRONCHIAL NEEDLE ASPIRATION BIOPSIES;  Surgeon: [name], DO;  Location: [place] [facility];  Service: Pulmonary;;   BRONCHIAL WASHINGS  07/18/2020   Procedure: BRONCHIAL WASHINGS;  Surgeon: [name], DO;  Location: [place] [facility];  Service: Pulmonary;;   FIDUCIAL MARKER PLACEMENT  07/18/2020   Procedure: FIDUCIAL MARKER PLACEMENT;  Surgeon: [name], DO;  Location: [place] [facility];  Service: Pulmonary;;   INTERCOSTAL NERVE BLOCK Right 08/26/2020   Procedure: INTERCOSTAL NERVE BLOCK;  Surgeon:  [name], MD;  Location: [place];  Service: Thoracic;  Laterality: Right;   IR THORACENTESIS ASP PLEURAL SPACE W/IMG GUIDE  10/10/2020   NODE DISSECTION Right 08/26/2020   Procedure: NODE DISSECTION;  Surgeon: [name], MD;  Location: [place];  Service: Thoracic;  Laterality: Right;   THORACENTESIS Right 11/04/2020   Procedure: [facility];  Surgeon: [name], DO;  Location: [place] [facility];  Service: Pulmonary;  Laterality: Right;   THORACENTESIS Right 11/29/2020   Procedure: THORACENTESIS;  Surgeon: [name], DO;  Location: [facility];  Service: Pulmonary;  Laterality: Right;   VIDEO BRONCHOSCOPY WITH ENDOBRONCHIAL NAVIGATION N/A 07/18/2020   Procedure: VIDEO BRONCHOSCOPY WITH ENDOBRONCHIAL NAVIGATION;  Surgeon: [name], DO;  Location: [place];  Service: Pulmonary;  Laterality: N/A;   VIDEO BRONCHOSCOPY WITH ENDOBRONCHIAL ULTRASOUND  07/18/2020   Procedure: VIDEO BRONCHOSCOPY WITH ENDOBRONCHIAL ULTRASOUND;  Surgeon: [name], DO;  Location: [place];  Service: Pulmonary;;   Patient Active Problem List   Diagnosis Date Noted   Blurred vision 08/16/2021   Migraine 08/16/2021   Swelling of both lower extremities 05/08/2021   Frequent headaches 03/25/2021   Sciatica of left side 03/25/2021   Chronic low back pain 03/25/2021   Anxiety disorder 03/25/2021   Intraparenchymal hematoma of brain 02/27/2021   Stroke, 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   Intracranial hemorrhage ([place]) 02/22/2021  Chemotherapy induced neutropenia (HCC) 01/08/2021   Bradycardia 01/01/2021   Hypomagnesemia 12/10/2020   Nausea without vomiting 12/10/2020   Status post thoracentesis    Pleural effusion, right 11/01/2020   Adenocarcinoma of right lung, stage 2 ([place]) 09/11/2020   Encounter for antineoplastic chemotherapy 09/11/2020   S/P Robotic Assited Video Thoracoscopy with Right Upper Lobectomy Lung 08/26/2020   Mediastinal adenopathy 07/18/2020   Lung  nodules    Lung nodule 06/14/2020   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   Tobacco dependence 05/29/2020   Exertional chest pain 05/28/2020   Numbness and tingling of left arm and leg 07/01/2012   Hyperlipidemia with target low density lipoprotein (LDL) cholesterol less than 100 mg/dL 07/01/2012   Chest pressure 06/30/2012   GERD (gastroesophageal reflux disease) 06/30/2012    ONSET DATE: 08/15/21  REFERRING DIAG: R29.90 (ICD-10-CM) - Stroke-like symptoms H53.8 (ICD-10-CM) - Blurred vision G43.809 (ICD-10-CM) - Other migraine without status migrainosus, not intractable   THERAPY DIAG:  Unsteadiness on feet  Chronic right-sided low back pain with right-sided sciatica  SUBJECTIVE:   SUBJECTIVE STATEMENT: She was having vision changes and headaches for 24 hours. She tried to take medication but it didn't relieve headaches so she went to hospital. By the time she was in hospital most of her symptoms were resolved. MRI didn't show anything significant since the MRI studies earlier in the year. Pt also reports on and off sciatica symptoms in R leg. Pt had 2-3 cortisone shots about 3 years ago.                                                                                                                                                                                                           PERTINENT HISTORY: PMH of stage 2 adenocarcinoma of the right lung s/p RUL lobectomy and chemotherapy, HLD, and history of an intracranial hemorrhage in June of 2022.   PAIN:  Are you having pain? Yes VAS scale: 4/10 in back, 6/10 in leg Pain location: lower back Pain orientation: Right  PAIN TYPE: aching Pain description: intermittent  Aggravating factors: prolonged sitting Relieving factors: rest, position changes  PRECAUTIONS: None  WEIGHT BEARING RESTRICTIONS No  FALLS: Has patient fallen in last 6 months? No, Number of falls: 0  LIVING ENVIRONMENT: Lives with: lives with their spouse Lives  in: House/apartment Stairs: Yes; Internal: 1 steps; Rail on left going up   PLOF: Independent  PATIENT GOALS work on little bit of balance and work on my sciatica  OBJECTIVE:   DIAGNOSTIC FINDINGS: 08/15/21: IMPRESSION: No evidence of acute intracranial  abnormality.   Redemonstrated chronic cortical/subcortical infarct within the right parietal lobe, with associated chronic blood products.   Otherwise unremarkable non-contrast MRI appearance of the brain.     COGNITION: Overall cognitive status: Within functional limits for tasks assessed   Lumbar AROM: Flexion: 80% no pain in back but neural tension in R LE Extension: 60% with pain in R side of low back Lateral flexion: 80% with pain on R side of low back  PATIENT SURVEYS:  Modified Oswestry 52%    TODAY'S TREATMENT:  Prone press ups: 5x   Anatomy and pathology education for disc herniation   Discussed importance of neutral posture with sitting Seated hamstring stretch/toe reaches: 3 x 15" R and L  PATIENT EDUCATION: Education details: see today's treatment Person educated: Patient Education method: Explanation Education comprehension: verbalized understanding   HOME EXERCISE PROGRAM: Access Code [email or web address] + seated hamstring stretch  ASSESSMENT:  CLINICAL IMPRESSION: Patient is a 76 y.o. female who was seen today for physical therapy evaluation and treatment for gait and balance issue and chronic lower back pain with R sided sciatica.. Objective impairments include Abnormal gait, decreased balance, decreased endurance, decreased ROM, decreased strength, impaired flexibility, improper body mechanics, postural dysfunction, and pain. These impairments are limiting patient from cleaning, community activity, driving, laundry, yard work, and yard work. Personal factors including Past/current experiences, Time since onset of injury/illness/exacerbation, and 1-2 comorbidities: Stage 2 adenocarcinoma of R lung, s/p RUL  lobectomy and chemotherapy, HLD, hx of stroke  are also affecting patient's functional outcome. Patient will benefit from skilled PT to address above impairments and improve overall function.  REHAB POTENTIAL: Good  CLINICAL DECISION MAKING: Stable/uncomplicated  EVALUATION COMPLEXITY: Low   GOALS: Goals reviewed with patient? Yes  SHORT TERM GOALS:  STG Name Target Date Goal status  1 Patient will be able to cook for 30 min without radicular symptoms down her R leg Baseline: 20 min before pain in R leg becomes unbearable 10/08/2021 INITIAL  2 Patient will be able to sit for 30 min without any radicular symptoms in her R leg Baseline: 20-30 min before pain starts 10/08/2021 INITIAL  3 Patient will report <4/10 pain in her R leg after sitting or standing for 30-40 min to improve functional endurance Baseline: 6-8/10 pain in R leg with 30-40 min sitting/standing 10/08/2021 INITIAL   LONG TERM GOALS:   LTG Name Target Date Goal status  1 Patient will report <3/10 pain in her back and R leg with 60 min of standing/walking to improve functional endurance Baseline:30-40 min of sitting to standing with 6-8/10 pain in back and leg 11/05/2021 INITIAL  2 Patient will demo pain free and 100% lumbar ROM to improve bending and twisting with functional activities. Baseline: 80% lateral flexion 60% with lumbar extension 11/05/2021 INITIAL  3 Patient will report score of <30/100% on modified oswestry to improve overall function. Baseline: 11/05/2021 INITIAL  4 FGA goal to be updated on 2nd visit Baseline 11/05/2021 INITIAL   PLAN: PT FREQUENCY: 1-2x/week  PT DURATION: 8 weeks  PLANNED INTERVENTIONS: Therapeutic exercises, Therapeutic activity, Neuro Muscular re-education, Balance training, Gait training, Patient/Family education, Joint mobilization, Stair training, Electrical stimulation, Spinal mobilization, Cryotherapy, Moist heat, Vasopneumatic device, and Manual therapy  PLAN FOR NEXT SESSION: Do  FGA   Kerrie Pleasure, PT 09/10/2021, 9:03 AM  [facility] [address] [place], [place], [address] Phone: [phone]   Fax:  [phone]

## 2021-09-10 NOTE — Progress Notes (Signed)
[facility] [address] [place]. [place] [address] [phone]       HOSPITAL FOLLOW UP NOTE  [name] Date of Birth:  22-Oct-1945 Medical Record Number:  [id number]   Reason for Referral:  hospital stroke follow up    SUBJECTIVE:   CHIEF COMPLAINT:  Chief Complaint  Patient presents with   Hematoma of brain    Rm 3, 3 month FU  "in hospital early Dec for headache I couldn't get rid of; doing well since"    HPI:   Update 09/10/2021 JM: Patient returns unaccompanied for 58-month stroke follow-up.  Presented to ED on 12/9 with headache, OS visual loss, disdiadockokinesia and impaired coordination likely due to recrudescence of prior right parietal ICH vs complicated migraine. MR brain negative for acute infarct or findings.  CTA head/neck unremarkable. GCA normal CRP and minimally elevated ESR. 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 HA tx'd with IV Toradol and Phenergan with improvement in visual acuity return back to baseline on day of discharge. PT/OT eval with mild gait imbalance and referred to outpatient PT/OT.  Stable since that time without new, reoccurring or worsening stroke/TIA symptoms. Denies recurrence of headache. Reports some residual left leg weakness, gait impairment, left peripheral visual impairment, and memory difficulty but overall improving. Evaluated by PT today and will continue for 6 weeks. Completed SLP 12/22. Completed OT 12/20. Has not yet been seen by ophthalmology. Not running in to objects as much on the left.  No additional headaches.   Blood pressure today 114/73 -routinely monitors at home and has been stable  No further concerns at this time     History provided for reference purposes only Initial visit 06/11/2021 JM: Tonya [name] is being seen for hospital follow-up accompanied by her husband, Tonya [name].   Cognitive concerns - short term memory - at times  delayed recall. Able to maintain ADLs independently and has been slowly returning back to IADLs such as cooking and cleaning.  She questions return to driving Imbalance - stumbling.  Gradually improving.  Ambulates without assistive device Left side visual impairment - bumping into things on left side.  Perceptual difficulties.  Question seeing an ophthalmologist Depression/anxiety -struggling with adjusting to her deficits as she was completely independent prior.  Denies prior history of depression but currently taking Xanax typically only prior to bedtime Denies residual headache - has since discontinued topiramate  Completed home health therapy and started outpatient therapies on 9/22  Blood pressure today 97/60 - does monitor at home and typically 120s-130s/60s.    Stroke admission 02/22/2021 Tonya [name] is a 76 y.o. female w/pmh of GERD, history of bradycardia with ventricular bigeminy, HLD, Stage IIB (T3, N0, M0) non-small cell lung cancer, adenocarcinoma involving the right upper lobe who initially presented on OSH with AMS and on teleneuro exam, patient nonverbal, unable to follow commands, right gaze deviation and generalized but L>R weakness, found to have a right parietal parasaggital bleed for which she transferred to [facility] on 02/22/2021 for further work up and intervention.  Personally reviewed hospitalization pertinent progress notes, lab work and imaging.  Evaluated by Dr. Leonie Man for right parietal IPH with small frontal subarachnoid hemorrhage with etiology likely hypertensive in nature although recommended repeat MRI 2 to 3 months due to unclear exact etiology of bleed. MRV negative for CVST.  No evidence of vascular malformations or mets contributing to bleed.  No prior HTN history of elevated SBP during hospitalization and  initiated lisinopril.  EF 60 to 65%.  LDL 101.  A1c 5.4.  Episode of RUE shaking with long-term EEG continuous generalized and right hemispheric slowing  but no seizure activity - Keppra 500 mg twice daily initiated for seizure prophylaxis but eventually discontinued due to possible side effect of hallucinations.  Therapy evaluations recommended CIR for residual left-sided weakness, right gaze preference, balance deficits high-level cognitive deficits and continued hallucinations        PERTINENT IMAGING  08/15/2021 CT HEAD IMPRESSION: No acute abnormality.  Chronic infarct right parietal lobe.  08/15/2021 MR BRAIN IMPRESSION: No evidence of acute intracranial abnormality.   Redemonstrated chronic cortical/subcortical infarct within the right parietal lobe, with associated chronic blood products.   Otherwise unremarkable non-contrast MRI appearance of the brain.   Right mastoid effusion.   08/15/2021 CTA HEAD/NECK IMPRESSION: 1. Negative for intracranial stenosis or large vessel occlusion 2. No significant carotid or vertebral artery stenosis in the neck. 3. CT head demonstrates no acute abnormality. Chronic infarct right parietal lobe 4. 8 mm spiculated nodule left upper lobe, unchanged from prior chest CT 02/04/2021   02/23/21 CT Head WO IV Contrast 1. Intraparenchymal hematoma centered in the high right parietal lobe with subarachnoid extension over the right convexity and into the basal cisterns. 2. No midline shift or other mass effect.   02/23/21 MR Brain W WO Contrast  Stable appearance of right parietal hemorrhage without evidence of underlying mass lesion or vascular malformation.  Stable small subdural and subarachnoid hemorrhage.  Small amount of blood within the ventricles but no hydrocephalus.   02/23/21 MR Angio Head Neck  WO Contrast  Normal variant MRA of circle of Willis without large vessel stenosis or occlusion or aneurysm.   02/23/21 MR Venogram Head  Normal.  No evidence of venous sinus thrombosis   EEG 6/19 to 6/20  No evidence of seizures      ROS:   14 system review of systems performed and  negative with exception of those listed in HPI  PMH:  Past Medical History:  Diagnosis Date   Adenoma of left adrenal gland    Cancer ([place])    Lung   Colon polyps    hyperplastic   Complication of anesthesia    Very emotional and cries after anesthesia   GERD (gastroesophageal reflux disease)    High blood pressure 03/25/2021   Kidney stone    Liver lesion    Microhematuria    Stroke [facility]     PSH:  Past Surgical History:  Procedure Laterality Date   ABDOMINAL HYSTERECTOMY     APPENDECTOMY     BRONCHIAL BIOPSY  07/18/2020   Procedure: BRONCHIAL BIOPSIES;  Surgeon: [name], DO;  Location: [facility];  Service: Pulmonary;;   BRONCHIAL BRUSHINGS  07/18/2020   Procedure: BRONCHIAL BRUSHINGS;  Surgeon: [name], DO;  Location: [facility];  Service: Pulmonary;;   BRONCHIAL NEEDLE ASPIRATION BIOPSY  07/18/2020   Procedure: BRONCHIAL NEEDLE ASPIRATION BIOPSIES;  Surgeon: [name], DO;  Location: [facility];  Service: Pulmonary;;   BRONCHIAL WASHINGS  07/18/2020   Procedure: BRONCHIAL WASHINGS;  Surgeon: [name], DO;  Location: [facility];  Service: Pulmonary;;   FIDUCIAL MARKER PLACEMENT  07/18/2020   Procedure: FIDUCIAL MARKER PLACEMENT;  Surgeon: [name], DO;  Location: [facility];  Service: Pulmonary;;   INTERCOSTAL NERVE BLOCK Right 08/26/2020   Procedure: INTERCOSTAL NERVE BLOCK;  Surgeon: [name], MD;  Location: [facility];  Service: Thoracic;  Laterality: Right;   IR THORACENTESIS ASP PLEURAL SPACE W/IMG GUIDE  10/10/2020   NODE DISSECTION Right 08/26/2020   Procedure: NODE DISSECTION;  Surgeon: [name], MD;  Location: [facility];  Service: Thoracic;  Laterality: Right;   THORACENTESIS Right 11/04/2020   Procedure: [facility];  Surgeon: [name], DO;  Location: [facility];  Service: Pulmonary;  Laterality: Right;   THORACENTESIS Right 11/29/2020   Procedure: THORACENTESIS;  Surgeon: [name], DO;  Location: [facility];  Service: Pulmonary;  Laterality: Right;   VIDEO BRONCHOSCOPY WITH ENDOBRONCHIAL NAVIGATION N/A 07/18/2020   Procedure: VIDEO BRONCHOSCOPY WITH ENDOBRONCHIAL NAVIGATION;  Surgeon: [name], DO;  Location: [facility];  Service: Pulmonary;  Laterality: N/A;   VIDEO BRONCHOSCOPY WITH ENDOBRONCHIAL ULTRASOUND  07/18/2020   Procedure: VIDEO BRONCHOSCOPY WITH ENDOBRONCHIAL ULTRASOUND;  Surgeon: [name], DO;  Location: [facility];  Service: Pulmonary;;    Social History:  Social History   Socioeconomic History   Marital status: Married    Spouse name: Not on file   Number of children: 3   Years of education: Not on file   Highest education level: Not on file  Occupational History   Not on file  Tobacco Use   Smoking status: Former    Packs/day: 0.30    Years: 60.00    Pack years: 18.00    Types: Cigarettes    Quit date: 06/11/2020    Years since quitting: 1.2   Smokeless tobacco: Never  Vaping Use   Vaping Use: Never used  Substance and Sexual Activity   Alcohol use: Yes    Comment: 0.5 drink per month   Drug use: No   Sexual activity: Not Currently    Birth control/protection: Surgical    Comment: Hysterectomy  Other Topics Concern   Not on file  Social History Narrative   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    Social Determinants of Health   Financial Resource Strain: Not on file  Food Insecurity: Not on file  Transportation Needs: Not on file  Physical Activity: Not on file  Stress: Not on file  Social Connections: Not on file  Intimate Partner Violence: Not on file    Family History:  Family History  Problem Relation Age of Onset   Arrhythmia Mother        s/p Pacer    Heart attack Father    Heart disease Sister    Colon cancer Neg Hx     Medications:   Current Outpatient Medications on File Prior to Visit  Medication Sig Dispense Refill   acetaminophen (TYLENOL) 325 MG tablet Take 1-2 tablets (325-650 mg total) by  mouth every 4 (four) hours as needed for mild pain.     albuterol (VENTOLIN HFA) 108 (90 Base) MCG/ACT inhaler Inhale 2 puffs into the lungs every 6 (six) hours as needed for wheezing or shortness of breath. 8 g 2   alendronate (FOSAMAX) 70 MG tablet Take 70 mg by mouth every Saturday.     ALPRAZolam (XANAX) 1 MG tablet Take 1 mg by mouth at bedtime as needed for sleep.     Calcium Carbonate-Vitamin D (CALCIUM + D PO) Take 1 tablet by mouth 2 (two) times daily.     lidocaine (LIDODERM) 5 % Place 3 patches onto the skin daily. Apply at 8 am and remove at 8 pm daily. (Patient taking differently: Place 3 patches onto the skin as needed. Apply at 8 am and remove at 8 pm daily.)  90 patch 0   magnesium oxide (MAG-OX) 400 (241.3 Mg) MG tablet Take 1 tablet (400 mg total) by mouth 2 (two) times daily. 60 tablet 1   Menthol-Methyl Salicylate (MUSCLE RUB) 10-15 % CREA Apply 1 application topically 2 (two) times daily. (Patient taking differently: Apply 1 application topically daily as needed for muscle pain.)  0   NARCAN 4 MG/0.1ML LIQD nasal spray kit 1 spray once.     omeprazole (PRILOSEC) 40 MG capsule Take 40 mg by mouth daily.     oxyCODONE-acetaminophen (PERCOCET) 10-325 MG tablet Take 1 tablet by mouth every 4 (four) hours as needed.     rosuvastatin (CRESTOR) 10 MG tablet Take 10 mg by mouth at bedtime.     umeclidinium-vilanterol (ANORO ELLIPTA) 62.5-25 MCG/INH AEPB Inhale 1 puff into the lungs daily. (Patient taking differently: Inhale 1 puff into the lungs as needed.) 60 each 0   folic acid (FOLVITE) 1 MG tablet TAKE 1 TABLET(1 MG) BY MOUTH DAILY (Patient not taking: Reported on 08/15/2021) 30 tablet 4   HYDROcodone-acetaminophen (NORCO) 10-325 MG tablet Take 1 tablet by mouth every 6 (six) hours as needed for moderate pain. (Patient not taking: Reported on 09/10/2021)     oxyCODONE-acetaminophen (PERCOCET/ROXICET) 5-325 MG tablet Take 1-2 tablets by mouth 2 (two) times daily as needed for severe pain  (severe pain). (Patient not taking: Reported on 09/10/2021) 28 tablet 0   No current facility-administered medications on file prior to visit.    Allergies:   Allergies  Allergen Reactions   Other Anaphylaxis    Spiders   Penicillins Hives    REACTION: 20 years   Tizanidine     Significant hypotension      OBJECTIVE:  Physical Exam  Vitals:   09/10/21 0938  BP: 114/73  Pulse: (!) 54  Weight: 147 lb 9.6 oz (67 kg)  Height: $Remove'5\' 4"'UCShtlE$  (1.626 m)   Body mass index is 25.34 kg/m. No results found.  General: well developed, well nourished, very pleasant elderly African-American female, seated, in no evident distress Head: head normocephalic and atraumatic.   Neck: supple with no carotid or supraclavicular bruits Cardiovascular: regular rate and rhythm, no murmurs Musculoskeletal: no deformity Skin:  no rash/petichiae Vascular:  Normal pulses all extremities   Neurologic Exam Mental Status: Awake and fully alert. Fluent speech and language.  Oriented to place and time. Recent memory impaired and remote memory intact. Attention span, concentration and fund of knowledge appropriate during visit. Mood and affect appropriate.  Cranial Nerves: Pupils equal, briskly reactive to light. Extraocular movements full without nystagmus. Visual fields full to confrontation (unable to appreciate peripheral visual impairment). Hearing intact. Facial sensation intact. Face, tongue, palate moves normally and symmetrically.  Motor: Normal bulk and tone. Normal strength in all tested extremity muscles except mild left hip flexor weakness Sensory.: intact to touch , pinprick , position and vibratory sensation.  Coordination: Rapid alternating movements normal in all extremities except slightly decreased left hand. Finger-to-nose and heel-to-shin performed accurately bilaterally. Gait and Station: Arises from chair without difficulty. Stance is normal. Gait demonstrates decreased stride length and step  height bilaterally with slow cautious gait without use of assistive device.  Tandem walk and heel toe without great difficulty.  Romberg negative. Reflexes: 1+ and symmetric. Toes downgoing.         ASSESSMENT: Tonya [name] is a 76 y.o. year old female with right parietal IPH with small frontal subarachnoid hemorrhage of indeterminate etiology on 02/22/2021 after presenting with AMS and  recent stroke like episode possibly in setting of recrudescence of prior stroke symptoms vs complicated migraine with worsening balance, OS visual impairment and HA on 08/15/2021. Vascular risk factors include bradycardia likely in setting of ventricular bigeminy, stage IIB non-small cell lung cancer and adenocarcinoma RUL, and new dx of HTN.      PLAN:  Right IPH with SAH:  Stroke like episode Residual deficit: Mild left hemiparesis, gait impairment with imbalance, cognitive impairment and left peripheral visual impairment (unable to appreciate on confrontation testing).  Continue working with PT for likely ongoing recovery.  Advised to follow-up with Dr. Katy Fitch as previously referred and as advised during recent admission.  Continue Crestor for secondary stroke prevention.  No indication for aspirin as no ischemic stroke history or cardiac/vascular stenting Discussed secondary stroke prevention measures and importance of close PCP follow up for aggressive stroke risk factor management. I have gone over the pathophysiology of stroke, warning signs and symptoms, risk factors and their management in some detail with instructions to go to the closest emergency room for symptoms of concern. HTN: BP goal <130/90. Stable on nonpharmacological management per PCP HLD: LDL goal <70. Continue Crestor 10 mg daily managed by PCP - recent lipid panel by PCP, satisfactory per report     Follow up in 6 months or call earlier if needed   CC:  PCP: Lin Landsman, MD    I spent 42 minutes of face-to-face and  non-face-to-face time with patient.  This included previsit chart review including review of recent hospitalization, lab review, study review, electronic health record documentation, patient education regarding prior stroke including potential etiology and recent stroke like episode, secondary stroke prevention measures and importance of managing stroke risk factors, residual deficits and typical recovery time and answered all other questions to patients satisfaction  Frann Rider, AGNP-BC  [facility] [address] [place],  [address]  Phone [phone] Fax [phone] Note: This document was prepared with digital dictation and possible smart phrase technology. Any transcriptional errors that result from this process are unintentional.

## 2021-09-10 NOTE — Therapy (Signed)
OUTPATIENT PHYSICAL THERAPY NEURO EVALUATION   Patient Name: Tonya [name] MRN: [id number] DOB:1946-02-18, 76 y.o., female Today's Date: 09/10/2021  PCP: Lin Landsman, MD REFERRING PROVIDER: Axel Filler, MD    PT End of Session - 09/10/21 0902     Visit Number 1    Number of Visits 16    Date for PT Re-Evaluation 11/05/21    Authorization Type Eval 09/10/20    PT Start Time 0805    PT Stop Time 0277    PT Time Calculation (min) 45 min    Activity Tolerance Patient tolerated treatment well    Behavior During Therapy [facility] for tasks assessed/performed             Past Medical History:  Diagnosis Date   Adenoma of left adrenal gland    Cancer ([place])    Lung   Colon polyps    hyperplastic   Complication of anesthesia    Very emotional and cries after anesthesia   GERD (gastroesophageal reflux disease)    High blood pressure 03/25/2021   Kidney stone    Liver lesion    Microhematuria    Stroke [facility])    Past Surgical History:  Procedure Laterality Date   ABDOMINAL HYSTERECTOMY     APPENDECTOMY     BRONCHIAL BIOPSY  07/18/2020   Procedure: BRONCHIAL BIOPSIES;  Surgeon: [name], DO;  Location: [place] [facility];  Service: Pulmonary;;   BRONCHIAL BRUSHINGS  07/18/2020   Procedure: BRONCHIAL BRUSHINGS;  Surgeon: [name], DO;  Location: [place];  Service: Pulmonary;;   BRONCHIAL NEEDLE ASPIRATION BIOPSY  07/18/2020   Procedure: BRONCHIAL NEEDLE ASPIRATION BIOPSIES;  Surgeon: [name], DO;  Location: [place] [facility];  Service: Pulmonary;;   BRONCHIAL WASHINGS  07/18/2020   Procedure: BRONCHIAL WASHINGS;  Surgeon: [name], DO;  Location: [place] [facility];  Service: Pulmonary;;   FIDUCIAL MARKER PLACEMENT  07/18/2020   Procedure: FIDUCIAL MARKER PLACEMENT;  Surgeon: [name], DO;  Location: [place] [facility];  Service: Pulmonary;;   INTERCOSTAL NERVE BLOCK Right 08/26/2020   Procedure: INTERCOSTAL NERVE BLOCK;  Surgeon:  [name], MD;  Location: [place];  Service: Thoracic;  Laterality: Right;   IR THORACENTESIS ASP PLEURAL SPACE W/IMG GUIDE  10/10/2020   NODE DISSECTION Right 08/26/2020   Procedure: NODE DISSECTION;  Surgeon: [name], MD;  Location: [place];  Service: Thoracic;  Laterality: Right;   THORACENTESIS Right 11/04/2020   Procedure: Mathews Robinsons;  Surgeon: [name], DO;  Location: [place] [facility];  Service: Pulmonary;  Laterality: Right;   THORACENTESIS Right 11/29/2020   Procedure: THORACENTESIS;  Surgeon: [name], DO;  Location: [facility];  Service: Pulmonary;  Laterality: Right;   VIDEO BRONCHOSCOPY WITH ENDOBRONCHIAL NAVIGATION N/A 07/18/2020   Procedure: VIDEO BRONCHOSCOPY WITH ENDOBRONCHIAL NAVIGATION;  Surgeon: [name], DO;  Location: [place];  Service: Pulmonary;  Laterality: N/A;   VIDEO BRONCHOSCOPY WITH ENDOBRONCHIAL ULTRASOUND  07/18/2020   Procedure: VIDEO BRONCHOSCOPY WITH ENDOBRONCHIAL ULTRASOUND;  Surgeon: [name], DO;  Location: [place];  Service: Pulmonary;;   Patient Active Problem List   Diagnosis Date Noted   Blurred vision 08/16/2021   Migraine 08/16/2021   Swelling of both lower extremities 05/08/2021   Frequent headaches 03/25/2021   Sciatica of left side 03/25/2021   Chronic low back pain 03/25/2021   Anxiety disorder 03/25/2021   Intraparenchymal hematoma of brain 02/27/2021   Stroke, 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[place]) 02/23/2021   Intracranial hemorrhage ([place]) 02/22/2021  Chemotherapy induced neutropenia (HCC) 01/08/2021   Bradycardia 01/01/2021   Hypomagnesemia 12/10/2020   Nausea without vomiting 12/10/2020   Status post thoracentesis    Pleural effusion, right 11/01/2020   Adenocarcinoma of right lung, stage 2 ([place]) 09/11/2020   Encounter for antineoplastic chemotherapy 09/11/2020   S/P Robotic Assited Video Thoracoscopy with Right Upper Lobectomy Lung 08/26/2020   Mediastinal adenopathy 07/18/2020   Lung  nodules    Lung nodule 06/14/2020   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   Tobacco dependence 05/29/2020   Exertional chest pain 05/28/2020   Numbness and tingling of left arm and leg 07/01/2012   Hyperlipidemia with target low density lipoprotein (LDL) cholesterol less than 100 mg/dL 07/01/2012   Chest pressure 06/30/2012   GERD (gastroesophageal reflux disease) 06/30/2012    ONSET DATE: 08/15/21  REFERRING DIAG: R29.90 (ICD-10-CM) - Stroke-like symptoms H53.8 (ICD-10-CM) - Blurred vision G43.809 (ICD-10-CM) - Other migraine without status migrainosus, not intractable   THERAPY DIAG:  Unsteadiness on feet  Chronic right-sided low back pain with right-sided sciatica  SUBJECTIVE:   SUBJECTIVE STATEMENT: She was having vision changes and headaches for 24 hours. She tried to take medication but it didn't relieve headaches so she went to hospital. By the time she was in hospital most of her symptoms were resolved. MRI didn't show anything significant since the MRI studies earlier in the year. Pt also reports on and off sciatica symptoms in R leg. Pt had 2-3 cortisone shots about 3 years ago.                                                                                                                                                                                                           PERTINENT HISTORY: PMH of stage 2 adenocarcinoma of the right lung s/p RUL lobectomy and chemotherapy, HLD, and history of an intracranial hemorrhage in June of 2022.   PAIN:  Are you having pain? Yes VAS scale: 4/10 in back, 6/10 in leg Pain location: lower back Pain orientation: Right  PAIN TYPE: aching Pain description: intermittent  Aggravating factors: prolonged sitting Relieving factors: rest, position changes  PRECAUTIONS: None  WEIGHT BEARING RESTRICTIONS No  FALLS: Has patient fallen in last 6 months? No, Number of falls: 0  LIVING ENVIRONMENT: Lives with: lives with their spouse Lives  in: House/apartment Stairs: Yes; Internal: 1 steps; Rail on left going up   PLOF: Independent  PATIENT GOALS work on little bit of balance and work on my sciatica  OBJECTIVE:   DIAGNOSTIC FINDINGS: 08/15/21: IMPRESSION: No evidence of acute intracranial  abnormality.   Redemonstrated chronic cortical/subcortical infarct within the right parietal lobe, with associated chronic blood products.   Otherwise unremarkable non-contrast MRI appearance of the brain.     COGNITION: Overall cognitive status: Within functional limits for tasks assessed  MMT:  MMT Right 09/10/2021 Left 09/10/2021  Hip flexion    Hip abduction    Hip adduction    Hip internal rotation    Hip external rotation    Knee flexion    Knee extension    Ankle dorsiflexion    Ankle plantarflexion    Ankle inversion    Ankle eversion    (Blank rows = not tested)  Lumbar AROM: Flexion: 80% no pain in back but neural tension in R LE Extension: 60% with pain in R side of low back Lateral flexion: 80% with pain on R side of low back  PATIENT SURVEYS:  Modified Oswestry 52%    TODAY'S TREATMENT:  Prone press ups: 5x   Anatomy and pathology education for disc herniation   Discussed importance of neutral posture with sitting Seated hamstring stretch/toe reaches: 3 x 15" R and L  PATIENT EDUCATION: Education details: see today's treatment Person educated: Patient Education method: Explanation Education comprehension: verbalized understanding   HOME EXERCISE PROGRAM: Access Code [email or web address] + seated hamstring stretch  ASSESSMENT:  CLINICAL IMPRESSION: Patient is a 76 y.o. female who was seen today for physical therapy evaluation and treatment for gait and balance issue and chronic lower back pain with R sided sciatica.. Objective impairments include Abnormal gait, decreased balance, decreased endurance, decreased ROM, decreased strength, impaired flexibility, improper body mechanics, postural dysfunction,  and pain. These impairments are limiting patient from cleaning, community activity, driving, laundry, yard work, and yard work. Personal factors including Past/current experiences, Time since onset of injury/illness/exacerbation, and 1-2 comorbidities: Stage 2 adenocarcinoma of R lung, s/p RUL lobectomy and chemotherapy, HLD, hx of stroke  are also affecting patient's functional outcome. Patient will benefit from skilled PT to address above impairments and improve overall function.  REHAB POTENTIAL: Good  CLINICAL DECISION MAKING: Stable/uncomplicated  EVALUATION COMPLEXITY: Low   GOALS: Goals reviewed with patient? Yes  SHORT TERM GOALS:  STG Name Target Date Goal status  1 Patient will be able to cook for 30 min without radicular symptoms down her R leg Baseline: 20 min before pain in R leg becomes unbearable 10/08/2021 INITIAL  2 Patient will be able to sit for 30 min without any radicular symptoms in her R leg Baseline: 20-30 min before pain starts 10/08/2021 INITIAL  3 Patient will report <4/10 pain in her R leg after sitting or standing for 30-40 min to improve functional endurance Baseline: 6-8/10 pain in R leg with 30-40 min sitting/standing 10/08/2021 INITIAL   LONG TERM GOALS:   LTG Name Target Date Goal status  1 Patient will report <3/10 pain in her back and R leg with 60 min of standing/walking to improve functional endurance Baseline:30-40 min of sitting to standing with 6-8/10 pain in back and leg 11/05/2021 INITIAL  2 Patient will demo pain free and 100% lumbar ROM to improve bending and twisting with functional activities. Baseline: 80% lateral flexion 60% with lumbar extension 11/05/2021 INITIAL  3 Patient will report score of <30/100% on modified oswestry to improve overall function. Baseline: 11/05/2021 INITIAL  4 FGA goal to be updated on 2nd visit Baseline 11/05/2021 INITIAL   PLAN: PT FREQUENCY: 1-2x/week  PT DURATION: 8 weeks  PLANNED INTERVENTIONS: Therapeutic  exercises, Therapeutic activity, Neuro Muscular  re-education, Balance training, Gait training, Patient/Family education, Joint mobilization, Stair training, Electrical stimulation, Spinal mobilization, Cryotherapy, Moist heat, Vasopneumatic device, and Manual therapy  PLAN FOR NEXT SESSION: Do FGA   Kerrie Pleasure, PT 09/10/2021, 9:03 AM  [facility] [address] [place], [place], [address] Phone: [phone]   Fax:  [phone]

## 2021-09-10 NOTE — Patient Instructions (Signed)
Continue Crestor 10mg  daily  for secondary stroke prevention  Continue to follow up with PCP regarding cholesterol and blood pressure management  Maintain strict control of hypertension with blood pressure goal below 130/90 and cholesterol with LDL cholesterol (bad cholesterol) goal below 70 mg/dL.   Signs of a Stroke? Follow the BEFAST method:  Balance Watch for a sudden loss of balance, trouble with coordination or vertigo Eyes Is there a sudden loss of vision in one or both eyes? Or double vision?  Face: Ask the person to smile. Does one side of the face droop or is it numb?  Arms: Ask the person to raise both arms. Does one arm drift downward? Is there weakness or numbness of a leg? Speech: Ask the person to repeat a simple phrase. Does the speech sound slurred/strange? Is the person confused ? Time: If you observe any of these signs, call 911.    Followup in the future with me in 6 months or call earlier if needed       Thank you for coming to see [place] at [facility]. I hope we have been able to provide you high quality care today.  You may receive a patient satisfaction survey over the next few weeks. We would appreciate your feedback and comments so that we may continue to improve ourselves and the health of our patients.

## 2021-09-17 ENCOUNTER — Ambulatory Visit: Payer: Medicare HMO

## 2021-09-17 ENCOUNTER — Other Ambulatory Visit: Payer: Self-pay

## 2021-09-17 DIAGNOSIS — G8929 Other chronic pain: Secondary | ICD-10-CM

## 2021-09-17 DIAGNOSIS — R2681 Unsteadiness on feet: Secondary | ICD-10-CM

## 2021-09-17 DIAGNOSIS — M5441 Lumbago with sciatica, right side: Secondary | ICD-10-CM

## 2021-09-17 NOTE — Therapy (Signed)
[facility] [address] [place], [place], [address] Phone: [phone]   Fax:  [phone]  Patient Details  Name: Tonya [name] MRN: [id number] Date of Birth: Oct 12, 1945 Referring Provider:  Lin Landsman, MD  Encounter Date: 09/17/2021     Kerrie Pleasure, PT 09/17/2021, 3:52 PM  [facility] [address] [place], [place], [address] Phone: [phone]   Fax:  [phone]

## 2021-09-17 NOTE — Therapy (Signed)
OUTPATIENT PHYSICAL THERAPY TREATMENT NOTE   Patient Name: Tonya [name] MRN: [id number] DOB:1946/09/04, 76 y.o., female Today's Date: 09/17/2021  PCP: Lin Landsman, MD REFERRING PROVIDER: REFERRING PROVIDER: Axel Filler, MD     PT End of Session - 09/17/21 0848     Visit Number 2    Number of Visits 16    Date for PT Re-Evaluation 11/05/21    Authorization Type Eval 09/10/20    PT Start Time 0845    PT Stop Time 0930    PT Time Calculation (min) 45 min    Activity Tolerance Patient tolerated treatment well    Behavior During Therapy [facility] for tasks assessed/performed             Past Medical History:  Diagnosis Date   Adenoma of left adrenal gland    Cancer ([employer])    Lung   Colon polyps    hyperplastic   Complication of anesthesia    Very emotional and cries after anesthesia   GERD (gastroesophageal reflux disease)    High blood pressure 03/25/2021   Kidney stone    Liver lesion    Microhematuria    Stroke [facility])    Past Surgical History:  Procedure Laterality Date   ABDOMINAL HYSTERECTOMY     APPENDECTOMY     BRONCHIAL BIOPSY  07/18/2020   Procedure: BRONCHIAL BIOPSIES;  Surgeon: [name], DO;  Location: [place] [facility];  Service: Pulmonary;;   BRONCHIAL BRUSHINGS  07/18/2020   Procedure: BRONCHIAL BRUSHINGS;  Surgeon: [name], DO;  Location: [place];  Service: Pulmonary;;   BRONCHIAL NEEDLE ASPIRATION BIOPSY  07/18/2020   Procedure: BRONCHIAL NEEDLE ASPIRATION BIOPSIES;  Surgeon: [name], DO;  Location: [place] [facility];  Service: Pulmonary;;   BRONCHIAL WASHINGS  07/18/2020   Procedure: BRONCHIAL WASHINGS;  Surgeon: [name], DO;  Location: [place] [facility];  Service: Pulmonary;;   FIDUCIAL MARKER PLACEMENT  07/18/2020   Procedure: FIDUCIAL MARKER PLACEMENT;  Surgeon: [name], DO;  Location: [place] [facility];  Service: Pulmonary;;   INTERCOSTAL NERVE BLOCK Right 08/26/2020   Procedure: INTERCOSTAL NERVE  BLOCK;  Surgeon: [name], MD;  Location: [place];  Service: Thoracic;  Laterality: Right;   IR THORACENTESIS ASP PLEURAL SPACE W/IMG GUIDE  10/10/2020   NODE DISSECTION Right 08/26/2020   Procedure: NODE DISSECTION;  Surgeon: [name], MD;  Location: [place];  Service: Thoracic;  Laterality: Right;   THORACENTESIS Right 11/04/2020   Procedure: Mathews Robinsons;  Surgeon: [name], DO;  Location: [place] [facility];  Service: Pulmonary;  Laterality: Right;   THORACENTESIS Right 11/29/2020   Procedure: THORACENTESIS;  Surgeon: [name], DO;  Location: [place] [facility]COPY;  Service: Pulmonary;  Laterality: Right;   VIDEO BRONCHOSCOPY WITH ENDOBRONCHIAL NAVIGATION N/A 07/18/2020   Procedure: VIDEO BRONCHOSCOPY WITH ENDOBRONCHIAL NAVIGATION;  Surgeon: [name], DO;  Location: [place];  Service: Pulmonary;  Laterality: N/A;   VIDEO BRONCHOSCOPY WITH ENDOBRONCHIAL ULTRASOUND  07/18/2020   Procedure: VIDEO BRONCHOSCOPY WITH ENDOBRONCHIAL ULTRASOUND;  Surgeon: [name], DO;  Location: [place];  Service: Pulmonary;;   Patient Active Problem List   Diagnosis Date Noted   Blurred vision 08/16/2021   Migraine 08/16/2021   Swelling of both lower extremities 05/08/2021   Frequent headaches 03/25/2021   Sciatica of left side 03/25/2021   Chronic low back pain 03/25/2021   Anxiety disorder 03/25/2021   Intraparenchymal hematoma of brain 02/27/2021   Stroke, 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[place]) 02/23/2021   Intracranial hemorrhage (  [place]) 02/22/2021   Chemotherapy induced neutropenia ([place]) 01/08/2021   Bradycardia 01/01/2021   Hypomagnesemia 12/10/2020   Nausea without vomiting 12/10/2020   Status post thoracentesis    Pleural effusion, right 11/01/2020   Adenocarcinoma of right lung, stage 2 ([place]) 09/11/2020   Encounter for antineoplastic chemotherapy 09/11/2020   S/P Robotic Assited Video Thoracoscopy with Right Upper Lobectomy Lung 08/26/2020   Mediastinal adenopathy  07/18/2020   Lung nodules    Lung nodule 06/14/2020   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   Tobacco dependence 05/29/2020   Exertional chest pain 05/28/2020   Numbness and tingling of left arm and leg 07/01/2012   Hyperlipidemia with target low density lipoprotein (LDL) cholesterol less than 100 mg/dL 07/01/2012   Chest pressure 06/30/2012   GERD (gastroesophageal reflux disease) 06/30/2012    REFERRING DIAG: R29.90 (ICD-10-CM) - Stroke-like symptoms H53.8 (ICD-10-CM) - Blurred vision G43.809 (ICD-10-CM) - Other migraine without status migrainosus, not intractable    THERAPY DIAG:  Unsteadiness on feet  Chronic right-sided low back pain with right-sided sciatica  ONSET DATE: 08/15/21   PERTINENT HISTORY: PMH of stage 2 adenocarcinoma of the right lung s/p RUL lobectomy and chemotherapy, HLD, and history of an intracranial hemorrhage in June of 2022.  PRECAUTIONS: None  SUBJECTIVE: Pt reports back pain is on and off. She started lying on the heating pad.  PAIN:  Are you having pain? Yes NPRS scale: 3/10 Pain location: back, R buttock Pain orientation: Right and Posterior  PAIN TYPE: aching and dull Pain description: intermittent  Aggravating factors: standing, walking Relieving factors: massage, rest, meds      PATIENT SURVEYS:  Modified Oswestry 52%      TODAY'S TREATMENT:  09/17/21: Lumbar AROM:(pre session) Flexion: 80% no pain in back but neural tension in R LE Extension: 60% with pain in R side of low back Soft tissue massage to R lumbar paraspinals, multifidus SL clamshells: 3 x 10 R and L Supine SLR: 2 x 10 R and L Supine ab bracing with bil knee to chest: 2 x 10  Evaluation: Prone press ups: 5x             Anatomy and pathology education for disc herniation             Discussed importance of neutral posture with sitting Seated hamstring stretch/toe reaches: 3 x 15" R and L   PATIENT EDUCATION: Education details: see today's treatment, reviewed short term  goals with patient verbally Person educated: Patient Education method: Explanation Education comprehension: verbalized understanding     HOME EXERCISE PROGRAM: Access Code [email or web address] + seated hamstring stretch   ASSESSMENT:   CLINICAL IMPRESSION: Today's session focused on doing soft tissue in back and R hip to improve tissue flexibility and improve pain. Patient reported improved ROM with lumbar flexion and extension and less pain with end range lumbar extension ROM. We initiated hip and core strengthening today. PT was given cues for    REHAB POTENTIAL: Good   CLINICAL DECISION MAKING: Stable/uncomplicated   EVALUATION COMPLEXITY: Low     GOALS: Goals reviewed with patient? Yes   SHORT TERM GOALS:   STG Name Target Date Goal status  1 Patient will be able to cook for 30 min without radicular symptoms down her R leg Baseline: 20 min before pain in R leg becomes unbearable 10/08/2021 INITIAL  2 Patient will be able to sit for 30 min without any radicular symptoms in her R leg Baseline: 20-30 min before pain starts 10/08/2021 INITIAL  3 Patient will report <4/10 pain in her R leg after sitting or standing for 30-40 min to improve functional endurance Baseline: 6-8/10 pain in R leg with 30-40 min sitting/standing 10/08/2021 INITIAL    LONG TERM GOALS:    LTG Name Target Date Goal status  1 Patient will report <3/10 pain in her back and R leg with 60 min of standing/walking to improve functional endurance Baseline:30-40 min of sitting to standing with 6-8/10 pain in back and leg 11/05/2021 INITIAL  2 Patient will demo pain free and 100% lumbar ROM to improve bending and twisting with functional activities. Baseline: 80% lateral flexion 60% with lumbar extension 11/05/2021 INITIAL  3 Patient will report score of <30/100% on modified oswestry to improve overall function. Baseline: 11/05/2021 INITIAL  4 FGA goal to be updated on 2nd visit Baseline 11/05/2021 INITIAL    PLAN: PT FREQUENCY:  1-2x/week   PT DURATION: 8 weeks   PLANNED INTERVENTIONS: Therapeutic exercises, Therapeutic activity, Neuro Muscular re-education, Balance training, Gait training, Patient/Family education, Joint mobilization, Stair training, Electrical stimulation, Spinal mobilization, Cryotherapy, Moist heat, Vasopneumatic device, and Manual therapy   PLAN FOR NEXT SESSION: Do FGA    Kerrie Pleasure, PT 09/17/2021, 9:27 AM

## 2021-09-24 ENCOUNTER — Other Ambulatory Visit: Payer: Self-pay

## 2021-09-24 ENCOUNTER — Ambulatory Visit: Payer: Medicare HMO

## 2021-09-24 DIAGNOSIS — G8929 Other chronic pain: Secondary | ICD-10-CM

## 2021-09-24 DIAGNOSIS — R2681 Unsteadiness on feet: Secondary | ICD-10-CM | POA: Diagnosis not present

## 2021-09-24 DIAGNOSIS — M6281 Muscle weakness (generalized): Secondary | ICD-10-CM

## 2021-09-24 NOTE — Patient Instructions (Signed)
Local Driver Evaluation Programs:  Comprehensive Evaluation: includes clinical and in vehicle behind the wheel testing by OCCUPATIONAL THERAPIST. Programs have varying levels of adaptive controls available for trial.   [employer], [place] [address] [place], [place]  [address] [phone] or [phone] [email or web address]-[email or web address] Evaluator:  Richelle Ito, OT/CDRS/CDI/SCDCM/[facility] [address] [place], [place] [address] [phone] [email or web address] Evaluators:  Bertram Savin, OT and Mertie Clause, OT  [facility]) [place] ([place]) Physical [place] [address] [place], [place]  [address] [phone] [email or web address] Evaluators:  Bernadene Bell, KT; Heron Sabins, KT;  Shirlee Latch, KT (KT=kiniesotherapist)   Clinical evaluations only:  Includes clinical testing, refers to other programs or local certified driving instructor for behind the wheel testing.  [facility] (outpatient Rehab) [place] [address] [place], [place] [address] [phone] for scheduling [email or web address] Evaluators:  Valentino Hue, OT; Haynes Hoehn, OT  Other area clinical evaluators available upon request including [facility], [place] and [facility].       Resource List What is a Warden/ranger: Your Road Ahead - A Guide to [employer] Evaluations http://www.thehartford.[email or web address]/resources/mature-market-excellence/publications-on-aging  Association for Musician - Disability and Driving Fact Sheets http://www.aded.net/?page=510  Driving after a Brain  Injury: Brain Injury Association of [employer] [place].be?A=SearchResult&SearchID=9495675&ObjectID=2758842&ObjectType=35  Driving with Adaptive Equipment: Chiropractor Association DebtRide.[email or web address].au

## 2021-09-24 NOTE — Therapy (Signed)
OUTPATIENT PHYSICAL THERAPY TREATMENT NOTE   Patient Name: Tonya [name] MRN: [id number] DOB:[birth year], 76 y.o., female Today's Date: 09/24/2021  PCP: Lin Landsman, MD REFERRING PROVIDER: REFERRING PROVIDER: Axel Filler, MD     PT End of Session - 09/24/21 0906     Visit Number 3    Number of Visits 16    Date for PT Re-Evaluation 11/05/21    Authorization Type Eval 09/10/20    Activity Tolerance Patient tolerated treatment well    Behavior During Therapy [facility] for tasks assessed/performed             Past Medical History:  Diagnosis Date   Adenoma of left adrenal gland    Cancer ([place])    Lung   Colon polyps    hyperplastic   Complication of anesthesia    Very emotional and cries after anesthesia   GERD (gastroesophageal reflux disease)    High blood pressure 03/25/2021   Kidney stone    Liver lesion    Microhematuria    Stroke [facility])    Past Surgical History:  Procedure Laterality Date   ABDOMINAL HYSTERECTOMY     APPENDECTOMY     BRONCHIAL BIOPSY  07/18/2020   Procedure: BRONCHIAL BIOPSIES;  Surgeon: [name], DO;  Location: [place] [facility];  Service: Pulmonary;;   BRONCHIAL BRUSHINGS  07/18/2020   Procedure: BRONCHIAL BRUSHINGS;  Surgeon: [name], DO;  Location: [place];  Service: Pulmonary;;   BRONCHIAL NEEDLE ASPIRATION BIOPSY  07/18/2020   Procedure: BRONCHIAL NEEDLE ASPIRATION BIOPSIES;  Surgeon: [name], DO;  Location: [place] [facility];  Service: Pulmonary;;   BRONCHIAL WASHINGS  07/18/2020   Procedure: BRONCHIAL WASHINGS;  Surgeon: [name], DO;  Location: [place] [facility];  Service: Pulmonary;;   FIDUCIAL MARKER PLACEMENT  07/18/2020   Procedure: FIDUCIAL MARKER PLACEMENT;  Surgeon: [name], DO;  Location: [place] [facility];  Service: Pulmonary;;   INTERCOSTAL NERVE BLOCK Right 08/26/2020   Procedure: INTERCOSTAL NERVE BLOCK;  Surgeon: [name], MD;  Location: [place];  Service: Thoracic;   Laterality: Right;   IR THORACENTESIS ASP PLEURAL SPACE W/IMG GUIDE  10/10/2020   NODE DISSECTION Right 08/26/2020   Procedure: NODE DISSECTION;  Surgeon: [name], MD;  Location: [place];  Service: Thoracic;  Laterality: Right;   THORACENTESIS Right 11/04/2020   Procedure: Mathews Robinsons;  Surgeon: [name], DO;  Location: [place] [facility];  Service: Pulmonary;  Laterality: Right;   THORACENTESIS Right 11/29/2020   Procedure: THORACENTESIS;  Surgeon: [name], DO;  Location: [facility];  Service: Pulmonary;  Laterality: Right;   VIDEO BRONCHOSCOPY WITH ENDOBRONCHIAL NAVIGATION N/A 07/18/2020   Procedure: VIDEO BRONCHOSCOPY WITH ENDOBRONCHIAL NAVIGATION;  Surgeon: [name], DO;  Location: [place];  Service: Pulmonary;  Laterality: N/A;   VIDEO BRONCHOSCOPY WITH ENDOBRONCHIAL ULTRASOUND  07/18/2020   Procedure: VIDEO BRONCHOSCOPY WITH ENDOBRONCHIAL ULTRASOUND;  Surgeon: [name], DO;  Location: [place];  Service: Pulmonary;;   Patient Active Problem List   Diagnosis Date Noted   Blurred vision 08/16/2021   Migraine 08/16/2021   Swelling of both lower extremities 05/08/2021   Frequent headaches 03/25/2021   Sciatica of left side 03/25/2021   Chronic low back pain 03/25/2021   Anxiety disorder 03/25/2021   Intraparenchymal hematoma of brain 02/27/2021   Stroke, 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   Intracranial hemorrhage ([place]) 02/22/2021   Chemotherapy induced neutropenia ([place]) 01/08/2021   Bradycardia 01/01/2021   Hypomagnesemia 12/10/2020   Nausea without vomiting 12/10/2020  Status post thoracentesis    Pleural effusion, right 11/01/2020   Adenocarcinoma of right lung, stage 2 ([place]) 09/11/2020   Encounter for antineoplastic chemotherapy 09/11/2020   S/P Robotic Assited Video Thoracoscopy with Right Upper Lobectomy Lung 08/26/2020   Mediastinal adenopathy 07/18/2020   Lung nodules    Lung nodule 06/14/2020   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    Tobacco dependence 05/29/2020   Exertional chest pain 05/28/2020   Numbness and tingling of left arm and leg 07/01/2012   Hyperlipidemia with target low density lipoprotein (LDL) cholesterol less than 100 mg/dL 07/01/2012   Chest pressure 06/30/2012   GERD (gastroesophageal reflux disease) 06/30/2012    REFERRING DIAG: R29.90 (ICD-10-CM) - Stroke-like symptoms H53.8 (ICD-10-CM) - Blurred vision G43.809 (ICD-10-CM) - Other migraine without status migrainosus, not intractable    THERAPY DIAG:  Unsteadiness on feet  Chronic right-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  PRECAUTIONS: None  SUBJECTIVE: Pt reports back is feeling better. Her pain is not as intense. She is still feeling pain going down the leg but it takes longer with standing/walking before pain goes down her leg.  PAIN:  Are you having pain? Yes NPRS scale: 3/10 Pain location: back, R buttock Pain orientation: Right and Posterior  PAIN TYPE: aching and dull Pain description: intermittent  Aggravating factors: standing, walking Relieving factors: massage, rest, meds      PATIENT SURVEYS:  Modified Oswestry 52%      TODAY'S TREATMENT:  09/24/21:  Soft tissue massage to bil lumbar paraspinalis, multifidus, R gluts SL hip abduction: 2lbs 2 x 10 R and L Supine bil knees to chest with isometricl hip adductor squeeze with 3lb ball between knees: 2 x 10 Supine lower trunk rotations: with swiss ball under knees: core contraction with small rolls: 20x R and L Supine bridge: 2 x 10 RDL from 8" box with 15lb KB: 10x, max cues for slight knee bent, pushing with legs when coming into extension:   09/17/21: Lumbar AROM:(pre session) Flexion: 80% no pain in back but neural tension in R  LE Extension: 60% with pain in R side of low back Soft tissue massage to R lumbar paraspinals, multifidus SL clamshells: 3 x 10 R and L Supine SLR: 2 x 10 R and L Supine ab bracing with bil knee to chest: 2 x 10  Evaluation: Prone press ups: 5x             Anatomy and pathology education for disc herniation             Discussed importance of neutral posture with sitting Seated hamstring stretch/toe reaches: 3 x 15" R and L   PATIENT EDUCATION: Education details: see today's treatment, educated on benefits of core strengthening. Person educated: Patient Education method: Explanation Education comprehension: verbalized understanding     HOME EXERCISE PROGRAM: Access Code [id number] + seated hamstring stretch   ASSESSMENT:   CLINICAL IMPRESSION: Pt is reporting gradual improvement in intensity of her pain and she is reporting she is able to stand/walk little longer before she feels referred pain down her R leg. Progressed core and hip strengthening as tolerated today.   REHAB POTENTIAL: Good   CLINICAL DECISION MAKING: Stable/uncomplicated   EVALUATION COMPLEXITY: Low     GOALS: Goals reviewed with patient? Yes  SHORT TERM GOALS:   STG Name Target Date Goal status  1 Patient will be able to cook for 30 min without radicular symptoms down her R leg Baseline: 20 min before pain in R leg becomes unbearable 10/08/2021 INITIAL  2 Patient will be able to sit for 30 min without any radicular symptoms in her R leg Baseline: 20-30 min before pain starts 10/08/2021 INITIAL  3 Patient will report <4/10 pain in her R leg after sitting or standing for 30-40 min to improve functional endurance Baseline: 6-8/10 pain in R leg with 30-40 min sitting/standing 10/08/2021 INITIAL    LONG TERM GOALS:    LTG Name Target Date Goal status  1 Patient will report <3/10 pain in her back and R leg with 60 min of standing/walking to improve functional endurance Baseline:30-40 min of sitting to standing  with 6-8/10 pain in back and leg 11/05/2021 INITIAL  2 Patient will demo pain free and 100% lumbar ROM to improve bending and twisting with functional activities. Baseline: 80% lateral flexion 60% with lumbar extension 11/05/2021 INITIAL  3 Patient will report score of <30/100% on modified oswestry to improve overall function. Baseline: 11/05/2021 INITIAL  4 FGA goal to be updated on 2nd visit Baseline 11/05/2021 INITIAL    PLAN: PT FREQUENCY: 1-2x/week   PT DURATION: 8 weeks   PLANNED INTERVENTIONS: Therapeutic exercises, Therapeutic activity, Neuro Muscular re-education, Balance training, Gait training, Patient/Family education, Joint mobilization, Stair training, Electrical stimulation, Spinal mobilization, Cryotherapy, Moist heat, Vasopneumatic device, and Manual therapy   PLAN FOR NEXT SESSION: Do FGA    Kerrie Pleasure, PT 09/24/2021, 9:07 AM

## 2021-09-25 ENCOUNTER — Encounter: Payer: Self-pay | Admitting: Rehabilitation

## 2021-09-25 DIAGNOSIS — R2681 Unsteadiness on feet: Secondary | ICD-10-CM

## 2021-09-25 NOTE — Therapy (Signed)
[place] [address] [place], [place], [address] Phone: [phone]   Fax:  [phone]  Patient Details  Name: Tonya [name] MRN: [id number] Date of Birth: 1946/05/01 Referring Provider:  No ref. provider found  Encounter Date: 09/25/2021   PHYSICAL THERAPY DISCHARGE SUMMARY (For Episode of care 05/29/21-08/06/21)  Visits from Start of Care: 9  Current functional level related to goals / functional outcomes: PT Long Term Goals - 08/06/21 1204                PT LONG TERM GOAL #1    Title Pt will be IND with final HEP in order to indicate dec fall risk and improved functional mobility.  (Target Date: 08/05/21)     Baseline 10/06/20: met with current program     Status Achieved          PT LONG TERM GOAL #2    Title Pt will improve gait speed to >/=3.25 ft/sec in order to indicate dec fall risk and improved efficiency of gait.     Baseline 3.0 ft/s (08/06/21), was 3.81 ft/sec on 07/08/21     Time --     Status Not Met          PT LONG TERM GOAL #3    Title Pt will perform 5TSS in </=11.5 secs without UE support in order to ind dec fall risk and improved functional strength.     Baseline 11.97 sec (08/06/21), 12.72 sec's on 07/08/21 so improved just not to goal     Time --     Period --     Status Partially Met          PT LONG TERM GOAL #4    Title Pt will improve FGA to >/=24/30 in order to indicate dec fall risk.     Baseline 24/30 (07/08/21)     Time --     Period --     Status Achieved          PT LONG TERM GOAL #5    Title Pt will ambulate >1000' over varying outdoor surfaces at mod I level demonstrating ability to scan L environment/avoid obstacles on L in order to indicate safe community mobility.     Baseline Deferred due to weather 08/06/2021, pt does demo improved scanning indoors with no bumping into obstacles on left side.     Time --     Period --     Status Deferred            Remaining deficits: Unsure as we were pending DC vs recert and then she was hospitalized with possible new CVA.  She is now being seen here for a new episode of care.     Education / Equipment: HEP    Patient agrees to discharge. Patient goals were partially met. Patient is being discharged due to a change in medical status.   Cameron Sprang, PT, MPT [facility] [address] [place], [place], [address] Phone: [phone]   Fax:  [phone] 09/25/21, 8:51 AM

## 2021-10-01 ENCOUNTER — Ambulatory Visit: Payer: Medicare HMO

## 2021-10-08 ENCOUNTER — Other Ambulatory Visit: Payer: Self-pay

## 2021-10-08 ENCOUNTER — Ambulatory Visit: Payer: Medicare HMO | Attending: Physical Medicine & Rehabilitation

## 2021-10-08 DIAGNOSIS — R2689 Other abnormalities of gait and mobility: Secondary | ICD-10-CM | POA: Insufficient documentation

## 2021-10-08 DIAGNOSIS — M5441 Lumbago with sciatica, right side: Secondary | ICD-10-CM | POA: Diagnosis present

## 2021-10-08 DIAGNOSIS — G8929 Other chronic pain: Secondary | ICD-10-CM | POA: Diagnosis present

## 2021-10-08 DIAGNOSIS — R2681 Unsteadiness on feet: Secondary | ICD-10-CM | POA: Diagnosis not present

## 2021-10-08 DIAGNOSIS — M6281 Muscle weakness (generalized): Secondary | ICD-10-CM | POA: Diagnosis present

## 2021-10-08 NOTE — Therapy (Signed)
OUTPATIENT PHYSICAL THERAPY TREATMENT NOTE   Patient Name: Tonya [name] MRN: [id number] DOB:[birth year], 76 y.o., female Today's Date: 10/08/2021  PCP: Lin Landsman, MD REFERRING PROVIDER: REFERRING PROVIDER: Axel Filler, MD     PT End of Session - 10/08/21 0851     Visit Number 4    Number of Visits 16    Date for PT Re-Evaluation 11/05/21    Authorization Type Eval 09/10/20    PT Start Time 0845    PT Stop Time 0930    PT Time Calculation (min) 45 min    Activity Tolerance Patient tolerated treatment well    Behavior During Therapy [facility] for tasks assessed/performed             Past Medical History:  Diagnosis Date   Adenoma of left adrenal gland    Cancer ([place])    Lung   Colon polyps    hyperplastic   Complication of anesthesia    Very emotional and cries after anesthesia   GERD (gastroesophageal reflux disease)    High blood pressure 03/25/2021   Kidney stone    Liver lesion    Microhematuria    Stroke [facility])    Past Surgical History:  Procedure Laterality Date   ABDOMINAL HYSTERECTOMY     APPENDECTOMY     BRONCHIAL BIOPSY  07/18/2020   Procedure: BRONCHIAL BIOPSIES;  Surgeon: [name], DO;  Location: [place] [facility];  Service: Pulmonary;;   BRONCHIAL BRUSHINGS  07/18/2020   Procedure: BRONCHIAL BRUSHINGS;  Surgeon: [name], DO;  Location: [place];  Service: Pulmonary;;   BRONCHIAL NEEDLE ASPIRATION BIOPSY  07/18/2020   Procedure: BRONCHIAL NEEDLE ASPIRATION BIOPSIES;  Surgeon: [name], DO;  Location: [place] [facility];  Service: Pulmonary;;   BRONCHIAL WASHINGS  07/18/2020   Procedure: BRONCHIAL WASHINGS;  Surgeon: [name], DO;  Location: [place] [facility];  Service: Pulmonary;;   FIDUCIAL MARKER PLACEMENT  07/18/2020   Procedure: FIDUCIAL MARKER PLACEMENT;  Surgeon: [name], DO;  Location: [place] [facility];  Service: Pulmonary;;   INTERCOSTAL NERVE BLOCK Right 08/26/2020   Procedure: INTERCOSTAL NERVE  BLOCK;  Surgeon: [name], MD;  Location: [place];  Service: Thoracic;  Laterality: Right;   IR THORACENTESIS ASP PLEURAL SPACE W/IMG GUIDE  10/10/2020   NODE DISSECTION Right 08/26/2020   Procedure: NODE DISSECTION;  Surgeon: [name], MD;  Location: [place];  Service: Thoracic;  Laterality: Right;   THORACENTESIS Right 11/04/2020   Procedure: Mathews Robinsons;  Surgeon: [name], DO;  Location: [place] [facility];  Service: Pulmonary;  Laterality: Right;   THORACENTESIS Right 11/29/2020   Procedure: THORACENTESIS;  Surgeon: [name], DO;  Location: [facility]OSCOPY;  Service: Pulmonary;  Laterality: Right;   VIDEO BRONCHOSCOPY WITH ENDOBRONCHIAL NAVIGATION N/A 07/18/2020   Procedure: VIDEO BRONCHOSCOPY WITH ENDOBRONCHIAL NAVIGATION;  Surgeon: [name], DO;  Location: [place];  Service: Pulmonary;  Laterality: N/A;   VIDEO BRONCHOSCOPY WITH ENDOBRONCHIAL ULTRASOUND  07/18/2020   Procedure: VIDEO BRONCHOSCOPY WITH ENDOBRONCHIAL ULTRASOUND;  Surgeon: [name], DO;  Location: [place];  Service: Pulmonary;;   Patient Active Problem List   Diagnosis Date Noted   Blurred vision 08/16/2021   Migraine 08/16/2021   Swelling of both lower extremities 05/08/2021   Frequent headaches 03/25/2021   Sciatica of left side 03/25/2021   Chronic low back pain 03/25/2021   Anxiety disorder 03/25/2021   Intraparenchymal hematoma of brain 02/27/2021   Stroke, 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/23/2021   Intracranial hemorrhage (  [place]) 02/22/2021   Chemotherapy induced neutropenia ([place]) 01/08/2021   Bradycardia 01/01/2021   Hypomagnesemia 12/10/2020   Nausea without vomiting 12/10/2020   Status post thoracentesis    Pleural effusion, right 11/01/2020   Adenocarcinoma of right lung, stage 2 ([place]) 09/11/2020   Encounter for antineoplastic chemotherapy 09/11/2020   S/P Robotic Assited Video Thoracoscopy with Right Upper Lobectomy Lung 08/26/2020   Mediastinal adenopathy  07/18/2020   Lung nodules    Lung nodule 06/14/2020   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   Tobacco dependence 05/29/2020   Exertional chest pain 05/28/2020   Numbness and tingling of left arm and leg 07/01/2012   Hyperlipidemia with target low density lipoprotein (LDL) cholesterol less than 100 mg/dL 07/01/2012   Chest pressure 06/30/2012   GERD (gastroesophageal reflux disease) 06/30/2012    REFERRING DIAG: R29.90 (ICD-10-CM) - Stroke-like symptoms H53.8 (ICD-10-CM) - Blurred vision G43.809 (ICD-10-CM) - Other migraine without status migrainosus, not intractable    THERAPY DIAG:  Unsteadiness on feet  Chronic right-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  PRECAUTIONS: None  SUBJECTIVE: Pt reports pain of 3/10. She noticed numbness down the R knee when seh was climbing on step stool and cleaning her cabinets. It lasted for about 2 hours afterwards.  PAIN:  Are you having pain? Yes NPRS scale: 3/10 Pain location: back, R buttock Pain orientation: Right and Posterior  PAIN TYPE: aching and dull Pain description: intermittent  Aggravating factors: standing, walking Relieving factors: massage, rest, meds      PATIENT SURVEYS:  Modified Oswestry 52%      TODAY'S TREATMENT:  10/08/21: Soft tissue massage to bil lumbar paraspinalis, multifidus, SL hip abduction: 3 lbs 3 x 10 R and L Supine bil lower trunk rotations with feet up for core work:2  x 5 R and L   RDL from 8" box with 15lb KB: 10x, min tactile cues  Pt educated on body mechanics, bringing weight closer to her before lifting it, thinking about how to lift the weight before lifting it to improve muscle activation and reduce strain in back.  09/24/21:  Soft tissue massage to bil lumbar  paraspinalis, multifidus, R gluts SL hip abduction: 2lbs 2 x 10 R and L Supine bil knees to chest with isometricl hip adductor squeeze with 3lb ball between knees: 2 x 10 Supine lower trunk rotations: with swiss ball under knees: core contraction with small rolls: 20x R and L Supine bridge: 2 x 10 RDL from 8" box with 15lb KB: 10x, max cues for slight knee bent, pushing with legs when coming into extension:   09/17/21: Lumbar AROM:(pre session) Flexion: 80% no pain in back but neural tension in R LE Extension: 60% with pain in R side of low back Soft tissue massage to R lumbar paraspinals, multifidus SL clamshells: 3 x 10 R and L Supine SLR: 2 x 10 R and L Supine ab bracing with bil knee to chest: 2 x 10  Evaluation: Prone press ups: 5x             Anatomy and pathology education for disc herniation             Discussed importance of neutral posture with sitting Seated hamstring stretch/toe reaches: 3 x 15" R and L   PATIENT EDUCATION: Education  details: see today's treatment, educated on benefits of core strengthening. Person educated: Patient Education method: Explanation Education comprehension: verbalized understanding     HOME EXERCISE PROGRAM: Access Code [id number] + seated hamstring stretch   ASSESSMENT:   CLINICAL IMPRESSION: Pt is managing pain well. She is compliant with exercises. She is reporting less frequent radiation of pain in her leg.   REHAB POTENTIAL: Good   CLINICAL DECISION MAKING: Stable/uncomplicated   EVALUATION COMPLEXITY: Low     GOALS: Goals reviewed with patient? Yes   SHORT TERM GOALS:   STG Name Target Date Goal status  1 Patient will be able to cook for 30 min without radicular symptoms down her R leg Baseline: 20 min before pain in R leg becomes unbearable 10/08/2021 INITIAL  2 Patient will be able to sit for 30 min without any radicular symptoms in her R leg Baseline: 20-30 min before pain starts 10/08/2021 INITIAL  3 Patient will  report <4/10 pain in her R leg after sitting or standing for 30-40 min to improve functional endurance Baseline: 6-8/10 pain in R leg with 30-40 min sitting/standing 10/08/2021 INITIAL    LONG TERM GOALS:    LTG Name Target Date Goal status  1 Patient will report <3/10 pain in her back and R leg with 60 min of standing/walking to improve functional endurance Baseline:30-40 min of sitting to standing with 6-8/10 pain in back and leg 11/05/2021 INITIAL  2 Patient will demo pain free and 100% lumbar ROM to improve bending and twisting with functional activities. Baseline: 80% lateral flexion 60% with lumbar extension 11/05/2021 INITIAL  3 Patient will report score of <30/100% on modified oswestry to improve overall function. Baseline: 11/05/2021 INITIAL  4 FGA goal to be updated on 2nd visit Baseline 11/05/2021 INITIAL    PLAN: PT FREQUENCY: 1-2x/week   PT DURATION: 8 weeks   PLANNED INTERVENTIONS: Therapeutic exercises, Therapeutic activity, Neuro Muscular re-education, Balance training, Gait training, Patient/Family education, Joint mobilization, Stair training, Electrical stimulation, Spinal mobilization, Cryotherapy, Moist heat, Vasopneumatic device, and Manual therapy   PLAN FOR NEXT SESSION: Do FGA    Kerrie Pleasure, PT 10/08/2021, 8:53 AM

## 2021-10-14 ENCOUNTER — Ambulatory Visit: Payer: Medicare HMO | Admitting: Psychology

## 2021-10-15 ENCOUNTER — Other Ambulatory Visit: Payer: Self-pay

## 2021-10-15 ENCOUNTER — Ambulatory Visit: Payer: Medicare HMO

## 2021-10-15 DIAGNOSIS — M6281 Muscle weakness (generalized): Secondary | ICD-10-CM

## 2021-10-15 DIAGNOSIS — R2681 Unsteadiness on feet: Secondary | ICD-10-CM

## 2021-10-15 DIAGNOSIS — G8929 Other chronic pain: Secondary | ICD-10-CM

## 2021-10-15 NOTE — Therapy (Signed)
OUTPATIENT PHYSICAL THERAPY TREATMENT NOTE   Patient Name: Tonya [name] MRN: [id number] DOB:Dec 06, 1945, 76 y.o., female Today's Date: 10/15/2021  PCP: Lin Landsman, MD REFERRING PROVIDER: REFERRING PROVIDER: Axel Filler, MD     PT End of Session - 10/15/21 [phone]     Visit Number 5    Number of Visits 16    Date for PT Re-Evaluation 11/05/21    Authorization Type Eval 09/10/20    Authorization Time Period [employer] 8 visits from 11/22 through 09/05/21    PT Start Time 0845    PT Stop Time 0930    PT Time Calculation (min) 45 min    Activity Tolerance Patient tolerated treatment well    Behavior During Therapy [facility] for tasks assessed/performed             Past Medical History:  Diagnosis Date   Adenoma of left adrenal gland    Cancer ([place])    Lung   Colon polyps    hyperplastic   Complication of anesthesia    Very emotional and cries after anesthesia   GERD (gastroesophageal reflux disease)    High blood pressure 03/25/2021   Kidney stone    Liver lesion    Microhematuria    Stroke [facility])    Past Surgical History:  Procedure Laterality Date   ABDOMINAL HYSTERECTOMY     APPENDECTOMY     BRONCHIAL BIOPSY  07/18/2020   Procedure: BRONCHIAL BIOPSIES;  Surgeon: [name], DO;  Location: [place] ENDOSCOPY;  Service: Pulmonary;;   BRONCHIAL BRUSHINGS  07/18/2020   Procedure: BRONCHIAL BRUSHINGS;  Surgeon: [name], DO;  Location: [place];  Service: Pulmonary;;   BRONCHIAL NEEDLE ASPIRATION BIOPSY  07/18/2020   Procedure: BRONCHIAL NEEDLE ASPIRATION BIOPSIES;  Surgeon: [name], DO;  Location: [place] ENDOSCOPY;  Service: Pulmonary;;   BRONCHIAL WASHINGS  07/18/2020   Procedure: BRONCHIAL WASHINGS;  Surgeon: [name], DO;  Location: [place] ENDOSCOPY;  Service: Pulmonary;;   FIDUCIAL MARKER PLACEMENT  07/18/2020   Procedure: FIDUCIAL MARKER PLACEMENT;  Surgeon: [name], DO;  Location: [place] ENDOSCOPY;  Service: Pulmonary;;    INTERCOSTAL NERVE BLOCK Right 08/26/2020   Procedure: INTERCOSTAL NERVE BLOCK;  Surgeon: [name], MD;  Location: [place];  Service: Thoracic;  Laterality: Right;   IR THORACENTESIS ASP PLEURAL SPACE W/IMG GUIDE  10/10/2020   NODE DISSECTION Right 08/26/2020   Procedure: NODE DISSECTION;  Surgeon: [name], MD;  Location: [place];  Service: Thoracic;  Laterality: Right;   THORACENTESIS Right 11/04/2020   Procedure: Mathews Robinsons;  Surgeon: [name], DO;  Location: [place] ENDOSCOPY;  Service: Pulmonary;  Laterality: Right;   THORACENTESIS Right 11/29/2020   Procedure: THORACENTESIS;  Surgeon: [name], DO;  Location: [facility] ENDOSCOPY;  Service: Pulmonary;  Laterality: Right;   VIDEO BRONCHOSCOPY WITH ENDOBRONCHIAL NAVIGATION N/A 07/18/2020   Procedure: VIDEO BRONCHOSCOPY WITH ENDOBRONCHIAL NAVIGATION;  Surgeon: [name], DO;  Location: [place];  Service: Pulmonary;  Laterality: N/A;   VIDEO BRONCHOSCOPY WITH ENDOBRONCHIAL ULTRASOUND  07/18/2020   Procedure: VIDEO BRONCHOSCOPY WITH ENDOBRONCHIAL ULTRASOUND;  Surgeon: [name], DO;  Location: [place];  Service: Pulmonary;;   Patient Active Problem List   Diagnosis Date Noted   Blurred vision 08/16/2021   Migraine 08/16/2021   Swelling of both lower extremities 05/08/2021   Frequent headaches 03/25/2021   Sciatica of left side 03/25/2021   Chronic low back pain 03/25/2021   Anxiety disorder 03/25/2021   Intraparenchymal hematoma  of brain 02/27/2021   Stroke, 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   Intracranial hemorrhage ([place]) 02/22/2021   Chemotherapy induced neutropenia ([place]) 01/08/2021   Bradycardia 01/01/2021   Hypomagnesemia 12/10/2020   Nausea without vomiting 12/10/2020   Status post thoracentesis    Pleural effusion, right 11/01/2020   Adenocarcinoma of right lung, stage 2 ([place]) 09/11/2020   Encounter for antineoplastic chemotherapy 09/11/2020   S/P Robotic Assited Video Thoracoscopy with  Right Upper Lobectomy Lung 08/26/2020   Mediastinal adenopathy 07/18/2020   Lung nodules    Lung nodule 06/14/2020   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   Tobacco dependence 05/29/2020   Exertional chest pain 05/28/2020   Numbness and tingling of left arm and leg 07/01/2012   Hyperlipidemia with target low density lipoprotein (LDL) cholesterol less than 100 mg/dL 07/01/2012   Chest pressure 06/30/2012   GERD (gastroesophageal reflux disease) 06/30/2012    REFERRING DIAG: R29.90 (ICD-10-CM) - Stroke-like symptoms H53.8 (ICD-10-CM) - Blurred vision G43.809 (ICD-10-CM) - Other migraine without status migrainosus, not intractable    THERAPY DIAG:  Unsteadiness on feet  Chronic right-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  PRECAUTIONS: None  SUBJECTIVE: Pt reports back is feelign better. No improvement in her perceived balance issues. Pt reports she thinks she may be turning too fast when she is turning. Pt reports she feels that her stability is less when she is turning.  PAIN:  Are you having pain? Yes NPRS scale: 3/10 Pain location: back, R buttock Pain orientation: Right and Posterior  PAIN TYPE: aching and dull Pain description: intermittent  Aggravating factors: standing, walking Relieving factors: massage, rest, meds      PATIENT SURVEYS:  Modified Oswestry 52%      TODAY'S TREATMENT:  10/15/21: SLS: able to stand for 20 sec (stopped time after 20 sec) R and L Modified CTSIB: 120 sec/120 sec (moderate sway in test 4) Testing her ability to turn 360 deg: pt able to turn in 4-5 sec bil and takes equal number of steps bil, no significant objective difference noted. Pt seemed more confident turning to her R compared to L but no significant objective  difference noted. Soft tissue massage to bil lumbar paraspinalis, multifidus, Supine bil lower trunk rotations with feet up for core work:210 x 5" holds R and L Single leg bridge: 2 x 10 R and L Pt educated on slowing down and shuffling feet instead of pivoting when turning to improve her stability with turning. Lumbar AROM: 100% pain free in flexion, extension, lateral flexion  10/08/21: Soft tissue massage to bil lumbar paraspinalis, multifidus, SL hip abduction: 3 lbs 3 x 10 R and L Supine bil lower trunk rotations with feet up for core work:2  x 5 R and L   RDL from 8" box with 15lb KB: 10x, min tactile cues  Pt educated on body mechanics, bringing weight closer to her before lifting it, thinking about how to lift the weight before lifting it to improve muscle activation and reduce strain in back.  09/24/21:  Soft tissue massage to bil lumbar paraspinalis, multifidus, R gluts SL hip abduction: 2lbs 2 x 10 R and L Supine bil knees to chest with isometricl hip adductor squeeze with 3lb ball between knees: 2 x 10 Supine lower trunk rotations: with swiss ball under knees:  core contraction with small rolls: 20x R and L Supine bridge: 2 x 10 RDL from 8" box with 15lb KB: 10x, max cues for slight knee bent, pushing with legs when coming into extension:   09/17/21: Lumbar AROM:(pre session) Flexion: 80% no pain in back but neural tension in R LE Extension: 60% with pain in R side of low back Soft tissue massage to R lumbar paraspinals, multifidus SL clamshells: 3 x 10 R and L Supine SLR: 2 x 10 R and L Supine ab bracing with bil knee to chest: 2 x 10  Evaluation: Prone press ups: 5x             Anatomy and pathology education for disc herniation             Discussed importance of neutral posture with sitting Seated hamstring stretch/toe reaches: 3 x 15" R and L   PATIENT EDUCATION: Education details: see today's treatment, educated on benefits of core strengthening. Person educated:  Patient Education method: Explanation Education comprehension: verbalized understanding     HOME EXERCISE PROGRAM: Access Code [id number] + seated hamstring stretch   ASSESSMENT:   CLINICAL IMPRESSION: We assessed patient's balance and proprioception. Patient's balance is good and no significant abnormality noted. Patient is reporting gradual improvement in her back pain and function.Marland Kitchen   REHAB POTENTIAL: Good   CLINICAL DECISION MAKING: Stable/uncomplicated   EVALUATION COMPLEXITY: Low     GOALS: Goals reviewed with patient? Yes   SHORT TERM GOALS:   STG Name Target Date Goal status  1 Patient will be able to cook for 30 min without radicular symptoms down her R leg Baseline: 20 min before pain in R leg becomes unbearable, 30 min for 80% of the time (10/15/21) 10/08/2021 Goal met  2 Patient will be able to sit for 30 min without any radicular symptoms in her R leg Baseline: 20-30 min before pain starts; not changed 10/08/2021 Not met  3 Patient will report <4/10 pain in her R leg after sitting or standing for 30-40 min to improve functional endurance Baseline: 6-8/10 pain in R leg with 30-40 min sitting/standing; 5/10 after 1 hour of sitting (10/15/21) 10/08/2021 Goal met    LONG TERM GOALS:    LTG Name Target Date Goal status  1 Patient will report <3/10 pain in her back and R leg with 60 min of standing/walking to improve functional endurance Baseline:30-40 min of sitting to standing with 6-8/10 pain in back and leg 11/05/2021 INITIAL  2 Patient will demo pain free and 100% lumbar ROM to improve bending and twisting with functional activities. Baseline: 80% lateral flexion 60% with lumbar extension; 100% lumbar ROM in all planes without pain 11/05/2021 Goal met  3 Patient will report score of <30/100% on modified oswestry to improve overall function. Baseline: 11/05/2021 INITIAL  4 FGA goal to be updated on 2nd visit Baseline: discontinued 10/15/21 11/05/2021 Discontinue    PLAN: PT  FREQUENCY: 1-2x/week   PT DURATION: 8 weeks   PLANNED INTERVENTIONS: Therapeutic exercises, Therapeutic activity, Neuro Muscular re-education, Balance training, Gait training, Patient/Family education, Joint mobilization, Stair training, Electrical stimulation, Spinal mobilization, Cryotherapy, Moist heat, Vasopneumatic device, and Manual therapy   PLAN FOR NEXT SESSION: continue with lumbar /hip strengthening.    Kerrie Pleasure, PT 10/15/2021, 9:30 AM

## 2021-10-22 ENCOUNTER — Other Ambulatory Visit: Payer: Self-pay

## 2021-10-22 ENCOUNTER — Ambulatory Visit: Payer: Medicare HMO

## 2021-10-22 DIAGNOSIS — R2689 Other abnormalities of gait and mobility: Secondary | ICD-10-CM

## 2021-10-22 DIAGNOSIS — R2681 Unsteadiness on feet: Secondary | ICD-10-CM | POA: Diagnosis not present

## 2021-10-22 DIAGNOSIS — M6281 Muscle weakness (generalized): Secondary | ICD-10-CM

## 2021-10-22 NOTE — Therapy (Signed)
OUTPATIENT PHYSICAL THERAPY TREATMENT NOTE   Patient Name: Tonya [name] MRN: [id number] DOB:12/30/1945, 75 y.o., female Today's Date: 10/22/2021  PCP: Lin Landsman, MD REFERRING PROVIDER: REFERRING PROVIDER: Axel Filler, MD     PT End of Session - 10/22/21 0859     Visit Number 6    Number of Visits 16    Date for PT Re-Evaluation 11/05/21    Authorization Type Eval 09/10/20    Authorization Time Period [employer] 8 visits from 11/22 through 09/05/21    PT Start Time 0850    PT Stop Time 0930    PT Time Calculation (min) 40 min    Activity Tolerance Patient tolerated treatment well    Behavior During Therapy [facility] for tasks assessed/performed             Past Medical History:  Diagnosis Date   Adenoma of left adrenal gland    Cancer ([place])    Lung   Colon polyps    hyperplastic   Complication of anesthesia    Very emotional and cries after anesthesia   GERD (gastroesophageal reflux disease)    High blood pressure 03/25/2021   Kidney stone    Liver lesion    Microhematuria    Stroke [facility])    Past Surgical History:  Procedure Laterality Date   ABDOMINAL HYSTERECTOMY     APPENDECTOMY     BRONCHIAL BIOPSY  07/18/2020   Procedure: BRONCHIAL BIOPSIES;  Surgeon: [name], DO;  Location: [place] ENDOSCOPY;  Service: Pulmonary;;   BRONCHIAL BRUSHINGS  07/18/2020   Procedure: BRONCHIAL BRUSHINGS;  Surgeon: [name], DO;  Location: [place];  Service: Pulmonary;;   BRONCHIAL NEEDLE ASPIRATION BIOPSY  07/18/2020   Procedure: BRONCHIAL NEEDLE ASPIRATION BIOPSIES;  Surgeon: [name], DO;  Location: [place] ENDOSCOPY;  Service: Pulmonary;;   BRONCHIAL WASHINGS  07/18/2020   Procedure: BRONCHIAL WASHINGS;  Surgeon: [name], DO;  Location: [place] ENDOSCOPY;  Service: Pulmonary;;   FIDUCIAL MARKER PLACEMENT  07/18/2020   Procedure: FIDUCIAL MARKER PLACEMENT;  Surgeon: [name], DO;  Location: [place] ENDOSCOPY;  Service: Pulmonary;;    INTERCOSTAL NERVE BLOCK Right 08/26/2020   Procedure: INTERCOSTAL NERVE BLOCK;  Surgeon: [name], MD;  Location: [place];  Service: Thoracic;  Laterality: Right;   IR THORACENTESIS ASP PLEURAL SPACE W/IMG GUIDE  10/10/2020   NODE DISSECTION Right 08/26/2020   Procedure: NODE DISSECTION;  Surgeon: [name], MD;  Location: [place];  Service: Thoracic;  Laterality: Right;   THORACENTESIS Right 11/04/2020   Procedure: Mathews Robinsons;  Surgeon: [name], DO;  Location: [place] ENDOSCOPY;  Service: Pulmonary;  Laterality: Right;   THORACENTESIS Right 11/29/2020   Procedure: THORACENTESIS;  Surgeon: [name], DO;  Location: [facility] ENDOSCOPY;  Service: Pulmonary;  Laterality: Right;   VIDEO BRONCHOSCOPY WITH ENDOBRONCHIAL NAVIGATION N/A 07/18/2020   Procedure: VIDEO BRONCHOSCOPY WITH ENDOBRONCHIAL NAVIGATION;  Surgeon: [name], DO;  Location: [place];  Service: Pulmonary;  Laterality: N/A;   VIDEO BRONCHOSCOPY WITH ENDOBRONCHIAL ULTRASOUND  07/18/2020   Procedure: VIDEO BRONCHOSCOPY WITH ENDOBRONCHIAL ULTRASOUND;  Surgeon: [name], DO;  Location: [place];  Service: Pulmonary;;   Patient Active Problem List   Diagnosis Date Noted   Blurred vision 08/16/2021   Migraine 08/16/2021   Swelling of both lower extremities 05/08/2021   Frequent headaches 03/25/2021   Sciatica of left side 03/25/2021   Chronic low back pain 03/25/2021   Anxiety disorder 03/25/2021   Intraparenchymal  hematoma of brain 02/27/2021   Stroke, 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[place]) 02/23/2021   Intracranial hemorrhage ([place]) 02/22/2021   Chemotherapy induced neutropenia ([place]) 01/08/2021   Bradycardia 01/01/2021   Hypomagnesemia 12/10/2020   Nausea without vomiting 12/10/2020   Status post thoracentesis    Pleural effusion, right 11/01/2020   Adenocarcinoma of right lung, stage 2 ([place]) 09/11/2020   Encounter for antineoplastic chemotherapy 09/11/2020   S/P Robotic Assited Video Thoracoscopy with  Right Upper Lobectomy Lung 08/26/2020   Mediastinal adenopathy 07/18/2020   Lung nodules    Lung nodule 06/14/2020   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   Tobacco dependence 05/29/2020   Exertional chest pain 05/28/2020   Numbness and tingling of left arm and leg 07/01/2012   Hyperlipidemia with target low density lipoprotein (LDL) cholesterol less than 100 mg/dL 07/01/2012   Chest pressure 06/30/2012   GERD (gastroesophageal reflux disease) 06/30/2012    REFERRING DIAG: R29.90 (ICD-10-CM) - Stroke-like symptoms H53.8 (ICD-10-CM) - Blurred vision G43.809 (ICD-10-CM) - Other migraine without status migrainosus, not intractable    THERAPY DIAG:  Unsteadiness on feet  Muscle weakness (generalized)  Other abnormalities of gait and mobility  ONSET DATE: 08/15/21   PERTINENT HISTORY: PMH of stage 2 adenocarcinoma of the right lung s/p RUL lobectomy and chemotherapy, HLD, and history of an intracranial hemorrhage in June of 2022.  PRECAUTIONS: None  SUBJECTIVE: Pt reports her back is feeling really good. She was able to stand and work in the kitchen for more than 30 min. She went to movies and was stiff after 2 hours but didn't feel pain down the leg.  PAIN:  Are you having pain? Yes NPRS scale: 0/10 Pain location: back, R buttock Pain orientation: Right and Posterior  PAIN TYPE: aching and dull Pain description: intermittent  Aggravating factors: standing, walking Relieving factors: massage, rest, meds      PATIENT SURVEYS:  Modified Oswestry 52%      TODAY'S TREATMENT:  10/22/21: Soft tissue massage to bil lumbar paraspinalis, multifidus, Supine bil lower trunk rotations with feet up for core work:210 x 5" holds R and L Single leg bridge: 2 x 10 R and L Sl hip abduction: 3 x 10 R and L Supine LE lifts: 20x   10/15/21: SLS: able to stand for 20 sec (stopped time after 20 sec) R and L Modified CTSIB: 120 sec/120 sec (moderate sway in test 4) Testing her ability to turn 360  deg: pt able to turn in 4-5 sec bil and takes equal number of steps bil, no significant objective difference noted. Pt seemed more confident turning to her R compared to L but no significant objective difference noted. Soft tissue massage to bil lumbar paraspinalis, multifidus, Supine bil lower trunk rotations with feet up for core work:210 x 5" holds R and L Single leg bridge: 2 x 10 R and L Pt educated on slowing down and shuffling feet instead of pivoting when turning to improve her stability with turning. Lumbar AROM: 100% pain free in flexion, extension, lateral flexion  10/08/21: Soft tissue massage to bil lumbar paraspinalis, multifidus, SL hip abduction: 3 lbs 3 x 10 R and L Supine bil lower trunk rotations with feet up for core work:2  x 5 R and L   RDL from 8" box with 15lb KB: 10x, min tactile cues  Pt educated on body mechanics, bringing weight closer to her before lifting it, thinking about how to lift the weight before lifting it to improve muscle activation and reduce strain in back.  09/24/21:  Soft tissue massage to bil lumbar paraspinalis, multifidus, R gluts SL hip abduction: 2lbs 2 x 10 R and L Supine bil knees to chest with isometricl hip adductor squeeze with 3lb ball between knees: 2 x 10 Supine lower trunk rotations: with swiss ball under knees: core contraction with small rolls: 20x R and L Supine bridge: 2 x 10 RDL from 8" box with 15lb KB: 10x, max cues for slight knee bent, pushing with legs when coming into extension:   09/17/21: Lumbar AROM:(pre session) Flexion: 80% no pain in back but neural tension in R LE Extension: 60% with pain in R side of low back Soft tissue massage to R lumbar paraspinals, multifidus SL clamshells: 3 x 10 R and L Supine SLR: 2 x 10 R and L Supine ab bracing with bil knee to chest: 2 x 10  Evaluation: Prone press ups: 5x             Anatomy and pathology education for disc herniation             Discussed importance of neutral  posture with sitting Seated hamstring stretch/toe reaches: 3 x 15" R and L   PATIENT EDUCATION: Education details: see today's treatment, educated on benefits of core strengthening. Person educated: Patient Education method: Explanation Education comprehension: verbalized understanding     HOME EXERCISE PROGRAM: Access Code [id number] + seated hamstring stretch   ASSESSMENT:   CLINICAL IMPRESSION: Pt has been seen for total of 6 session for lower back pain and LE radiculopathy. Patient has met all of her goals and is able to manage her symptoms independently. Patient will be discharged from skilled PT.   REHAB POTENTIAL: Good   CLINICAL DECISION MAKING: Stable/uncomplicated   EVALUATION COMPLEXITY: Low     GOALS: Goals reviewed with patient? Yes   SHORT TERM GOALS:   STG Name Target Date Goal status  1 Patient will be able to cook for 30 min without radicular symptoms down her R leg Baseline: 20 min before pain in R leg becomes unbearable, 30 min for 80% of the time (10/15/21) 10/08/2021 Goal met  2 Patient will be able to sit for 30 min without any radicular symptoms in her R leg Baseline: 20-30 min before pain starts; not changed 10/08/2021 Not met  3 Patient will report <4/10 pain in her R leg after sitting or standing for 30-40 min to improve functional endurance Baseline: 6-8/10 pain in R leg with 30-40 min sitting/standing; 5/10 after 1 hour of sitting (10/15/21) 10/08/2021 Goal met    LONG TERM GOALS:    LTG Name Target Date Goal status  1 Patient will report <3/10 pain in her back and R leg with 60 min of standing/walking to improve functional endurance Baseline:30-40 min of sitting to standing with 6-8/10 pain in back and leg Comments: pt able to stand for 45 min no pain in legs and mild pain in back 11/05/2021 Partially met  2 Patient will demo pain free and 100% lumbar ROM to improve bending and twisting with functional activities. Baseline: 80% lateral flexion 60% with  lumbar extension; 100% lumbar ROM in all planes without pain 11/05/2021 Goal met  3 Patient will report score of <30/100% on modified oswestry to improve overall function. Baseline: 11/05/2021 Not assessed  4 FGA goal to be updated on 2nd visit Baseline: discontinued 10/15/21 11/05/2021 Discontinue    PLAN: Discharge from skilled PT    Kerrie Pleasure, PT 10/22/2021, 9:21 AM

## 2021-11-06 ENCOUNTER — Other Ambulatory Visit: Payer: Self-pay

## 2021-11-06 ENCOUNTER — Encounter: Payer: Medicare HMO | Attending: Physical Medicine & Rehabilitation | Admitting: Physical Medicine & Rehabilitation

## 2021-11-06 ENCOUNTER — Encounter: Payer: Self-pay | Admitting: Physical Medicine & Rehabilitation

## 2021-11-06 VITALS — BP 153/83 | HR 62 | Temp 99.1°F | Ht 64.0 in | Wt 154.0 lb

## 2021-11-06 DIAGNOSIS — I69319 Unspecified symptoms and signs involving cognitive functions following cerebral infarction: Secondary | ICD-10-CM | POA: Insufficient documentation

## 2021-11-06 DIAGNOSIS — I619 Nontraumatic intracerebral hemorrhage, unspecified: Secondary | ICD-10-CM | POA: Diagnosis not present

## 2021-11-06 NOTE — Progress Notes (Signed)
Subjective:    Patient ID: Tonya [name], female    DOB: 19-Dec-1945, 76 y.o.   MRN: [id number] 76 y.o. female with history of lung cancer s/p RUL lobectomy with ongoing chemo, bradycardia in the past, who was admitted via [place] [facility] on 02/22/2021 with reports of headache followed by confusion and progressive stuporous state.  CT head showed large intraparenchymal hemorrhage within right parietal lobe as well as mild right frontal subarachnoid hemorrhage and patient had tremulousness with concerns of seizure activity.  She was intubated for airway protection and transferred to [facility] for management.  Dr. Venetia Constable was consulted for input and recommended follow-up MRI/MRA to rule out mass or vascular abnormality.  MRI/MRA brain showed stable appearance of right parietal bleed without mass or vascular malformation, subdural subarachnoid hemorrhage and small amount of blood within the ventricles without evidence of stenosis, aneurysm or occlusion.  MRV of head was negative.     She was started on Keppra due to right upper extremity tremors.  EEG done showing cortical dysfunction with diffuse encephalopathy which was felt to be nonspecific.  Hospital significant for issues with tachycardia, elevated blood pressures as well as mild dysphagia.  Dr. Leonie Man was following for input and felt that bleed was likely hypertensive in nature but to repeat MRI of brain in 2 to 3 months due to unclear etiology of bleed.  She continued to have deficits due to left-sided weakness with right gaze preference, balance deficits, high-level cognitive deficits as well as issues with hallucinations.  CIR will was recommended due to functional decline. Admit date: 02/27/2021 Discharge date: 03/25/2021 HPI  Has completed OP PT 10/22/21 Seen by Neuro last month.   Right rib pain which started after video-assisted thoracotomy.  Was started on gabapentin by her surgeon which helped but the patient is trying to  minimize her medication use.  In addition she has tried Lidoderm patch which has been helpful as well. No current HA Discussed cognitive recovery post stroke as well as driving. The patient has not had ophthalmologist evaluation thus far Pain Inventory Average Pain 5 Pain Right Now 5 My pain is intermittent, constant, tingling, aching, and tender to the touch  LOCATION OF PAIN   right rib area, lower back,left side weakness  BOWEL Number of stools per week: 4-5  BLADDER Normal    Mobility walk without assistance ability to climb steps?  yes do you drive?  no Do you have any goals in this area?  yes  Function retired  Neuro/Psych weakness tingling confusion depression  Prior Studies Any changes since last visit?  no  Physicians involved in your care Any changes since last visit?  no   Family History  Problem Relation Age of Onset   Arrhythmia Mother        s/p Pacer    Heart attack Father    Heart disease Sister    Colon cancer Neg Hx    Social History   Socioeconomic History   Marital status: Married    Spouse name: Not on file   Number of children: 3   Years of education: Not on file   Highest education level: Not on file  Occupational History   Not on file  Tobacco Use   Smoking status: Former    Packs/day: 0.30    Years: 60.00    Pack years: 18.00    Types: Cigarettes    Quit date: 06/11/2020    Years since quitting: 1.4   Smokeless tobacco:  Never  Vaping Use   Vaping Use: Never used  Substance and Sexual Activity   Alcohol use: Yes    Comment: 0.5 drink per month   Drug use: No   Sexual activity: Not Currently    Birth control/protection: Surgical    Comment: Hysterectomy  Other Topics Concern   Not on file  Social History Narrative   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    Social Determinants of Health   Financial Resource Strain: Not on file  Food Insecurity: Not on file  Transportation Needs: Not on file  Physical Activity: Not on file   Stress: Not on file  Social Connections: Not on file   Past Surgical History:  Procedure Laterality Date   ABDOMINAL HYSTERECTOMY     APPENDECTOMY     BRONCHIAL BIOPSY  07/18/2020   Procedure: BRONCHIAL BIOPSIES;  Surgeon: [name], DO;  Location: [facility];  Service: Pulmonary;;   BRONCHIAL BRUSHINGS  07/18/2020   Procedure: BRONCHIAL BRUSHINGS;  Surgeon: [name], DO;  Location: [place];  Service: Pulmonary;;   BRONCHIAL NEEDLE ASPIRATION BIOPSY  07/18/2020   Procedure: BRONCHIAL NEEDLE ASPIRATION BIOPSIES;  Surgeon: [name], DO;  Location: [place];  Service: Pulmonary;;   BRONCHIAL WASHINGS  07/18/2020   Procedure: BRONCHIAL WASHINGS;  Surgeon: [name], DO;  Location: [place] [facility];  Service: Pulmonary;;   FIDUCIAL MARKER PLACEMENT  07/18/2020   Procedure: FIDUCIAL MARKER PLACEMENT;  Surgeon: [name], DO;  Location: [place] [facility];  Service: Pulmonary;;   INTERCOSTAL NERVE BLOCK Right 08/26/2020   Procedure: INTERCOSTAL NERVE BLOCK;  Surgeon: [name], MD;  Location: [place];  Service: Thoracic;  Laterality: Right;   IR THORACENTESIS ASP PLEURAL SPACE W/IMG GUIDE  10/10/2020   NODE DISSECTION Right 08/26/2020   Procedure: NODE DISSECTION;  Surgeon: [name], MD;  Location: [place];  Service: Thoracic;  Laterality: Right;   THORACENTESIS Right 11/04/2020   Procedure: [facility] Robinsons;  Surgeon: [name], DO;  Location: [facility];  Service: Pulmonary;  Laterality: Right;   THORACENTESIS Right 11/29/2020   Procedure: THORACENTESIS;  Surgeon: [name], DO;  Location: [facility];  Service: Pulmonary;  Laterality: Right;   VIDEO BRONCHOSCOPY WITH ENDOBRONCHIAL NAVIGATION N/A 07/18/2020   Procedure: VIDEO BRONCHOSCOPY WITH ENDOBRONCHIAL NAVIGATION;  Surgeon: [name], DO;  Location: [place];  Service: Pulmonary;  Laterality: N/A;   VIDEO BRONCHOSCOPY WITH ENDOBRONCHIAL ULTRASOUND  07/18/2020    Procedure: VIDEO BRONCHOSCOPY WITH ENDOBRONCHIAL ULTRASOUND;  Surgeon: [name], DO;  Location: [place];  Service: Pulmonary;;   Past Medical History:  Diagnosis Date   Adenoma of left adrenal gland    Cancer ([place])    Lung   Colon polyps    hyperplastic   Complication of anesthesia    Very emotional and cries after anesthesia   GERD (gastroesophageal reflux disease)    High blood pressure 03/25/2021   Kidney stone    Liver lesion    Microhematuria    Stroke ([facility])    Ht 5\' 4"  (1.626 m)    Wt 154 lb (69.9 kg)    BMI 26.43 kg/m   Opioid Risk Score:   Fall Risk Score:  `1  Depression screen PHQ 2/9  Depression screen [facility] 2/9 06/19/2021 04/15/2021  Decreased Interest 0 0  Down, Depressed, Hopeless 0 0  PHQ - 2 Score 0 0  Altered sleeping - 1  Tired, decreased energy - 1  Change in appetite - 1  Feeling bad or failure about yourself  - 0  Trouble concentrating - 0  Moving slowly or fidgety/restless - 0  PHQ-9 Score - 3  Some recent data might be hidden    Review of Systems  Musculoskeletal:        Tingling Pain on the right side near ribs  Neurological:  Positive for weakness.  Psychiatric/Behavioral:  Positive for confusion.   All other systems reviewed and are negative.     Objective:   Physical Exam Vitals and nursing note reviewed.  Constitutional:      Appearance: She is normal weight.  HENT:     Head: Normocephalic and atraumatic.  Eyes:     Extraocular Movements: Extraocular movements intact.     Conjunctiva/sclera: Conjunctivae normal.     Pupils: Pupils are equal, round, and reactive to light.  Musculoskeletal:     Comments: Mild tenderness over the lateral aspect of the right lower ribs. Well-healed incisions multiple 2 to 3 cm intercostal around T10  Skin:    General: Skin is warm and dry.  Neurological:     Mental Status: She is alert and oriented to person, place, and time.  Psychiatric:        Mood and Affect: Mood normal.         Behavior: Behavior normal.   Visual fields are intact confrontation testing Romberg negative Tandem gait normal Motor strength is 5/5 bilateral upper and lower limbs      Assessment & Plan:   1.  History of right IPH with mild cognitive deficits, mild visual perceptual deficits as well as mild balance problems, discussed that cognitive recovery plateaus around 12 months, which is going to be June 2023. Discussed okay for return to drive if okay with ophthalmology but if patient/husband would like further assessment would recommend adaptive driving OT evaluation. Physical medicine rehab follow-up as needed basis 2.  Intercostal neuralgia continue lidocaine patch.  Discussed possibility of intercostal nerve blocks or resumption of gabapentin.  Patient would like to continue with the patches.

## 2021-11-06 NOTE — Patient Instructions (Signed)
Intercostal Nerve Block ?An intercostal nerve block is a procedure to block pain in your chest or rib. During this procedure, your health care provider will inject a numbing medicine (local anesthetic) into the space between your ribs where there are nerves called intercostal nerves. These nerves control feeling in your chest and rib area. In some cases, more than one injection may be done if a wide area needs to be blocked. You may need this procedure to: ?Numb the area during surgery of the chest wall, such as surgery on your breast or gallbladder. In these cases, the nerve block may be combined with other anesthesia. ?Reduce pain after chest surgery. ?Block pain during placement of a chest tube. ?Reduce pain after a chest injury, such as a broken rib. ?Reduce cancer pain. ?Relieve chronic chest pain. ?Relieve pain from a shingles infection. ?If the procedure is being done to treat long-term pain, a long-lasting anti-inflammatory medicine (steroid) may also be injected along with the numbing medicine. ?Tell a health care provider about: ?Any allergies you have. ?All medicines you are taking, including vitamins, herbs, eye drops, creams, and over-the-counter medicines. ?Any problems you or family members have had with anesthetic medicines. ?Any blood disorders you have. ?Any surgeries you have had. ?Any medical conditions you have. ?Whether you are pregnant or may be pregnant. ?What are the risks? ?Generally, this is a safe procedure. However, problems may occur, including: ?Infection. ?Bleeding. ?Allergic reactions to medicines. ?Damage to other structures, such as a nerve or blood vessel that runs close to an intercostal nerve. ?Damage to a lung, causing a lung collapse. ?What happens before the procedure? ?Staying hydrated ?Follow instructions from your health care provider about hydration, which may include: ?Up to 2 hours before the procedure - you may continue to drink clear liquids, such as water, clear  fruit juice, black coffee, and plain tea. ?Eating and drinking restrictions ?Follow instructions from your health care provider about eating and drinking, which may include: ?8 hours before the procedure - stop eating heavy meals or foods such as meat, fried foods, or fatty foods. ?6 hours before the procedure - stop eating light meals or foods, such as toast or cereal. ?6 hours before the procedure - stop drinking milk or drinks that contain milk. ?2 hours before the procedure - stop drinking clear liquids. ?Medicines ?Ask your health care provider about: ?Changing or stopping your regular medicines. This is especially important if you are taking diabetes medicines or blood thinners. ?Taking medicines such as aspirin and ibuprofen. These medicines can thin your blood. Do not take these medicines unless your health care provider tells you to take them. ?Taking over-the-counter medicines, vitamins, herbs, and supplements. ?General instructions ?Follow instructions from your health care provider about eating or drinking restrictions before the procedure. ?Do not use any products that contain nicotine or tobacco for at least 4 weeks before the procedure. These products include cigarettes, e-cigarettes, and chewing tobacco. If you need help quitting, ask your health care provider. ?Ask your health care provider what steps will be taken to help prevent infection. These steps may include: ?Removing hair at the injection site. ?Washing skin with a germ-killing soap. ?Plan to have a responsible adult take you home from the hospital or clinic. ?If you will be going home right after the procedure, plan to have a responsible adult take care of you at home for the time you are told. This is important. ?What happens during the procedure? ?An IV will be inserted into one  of your veins. ?You may be given a medicine to help you relax (sedative). ?Your health care provider will inject a local anesthetic to numb your skin in the area  where the needle will be inserted. ?Your health care provider will then: ?Place a needle through the numbed area on your chest between your ribs. ?Use X-ray or ultrasound imaging to guide the needle into the right place. ?Inject the anesthetic or the anesthetic and steroid into the area near an intercostal nerve. ?Remove the needle. ?If more than one block is needed, the procedure will be repeated between other ribs. ?The procedure may vary among health care providers and hospitals. ?What can I expect after the procedure? ?Your blood pressure, heart rate, breathing rate, and blood oxygen level will be monitored until you leave the hospital or clinic. ?You may feel less pain right away. ?If you are being treated for long-term pain, you may expect some pain to return when the anesthetic medicine wears off. After a few days, you should start to feel pain relief from the steroid medicine. ?Follow these instructions at home: ?  ?Take over-the-counter and prescription medicines only as told by your health care provider. ?If you were given a sedative during the procedure, it can affect you for several hours. Do not drive or operate machinery until your health care provider says that it is safe. ?Return to your normal activities as told by your health care provider. Ask your health care provider what activities are safe for you. ?Check your injection site every day for signs of infection. Check for: ?Redness, swelling, or pain. ?Fluid or blood. ?Warmth. ?Pus or a bad smell. ?Keep all follow-up visits. This is important. ?Contact your health care provider if: ?You have a fever or chills. ?You have any signs of infection at your injection site. ?You have a cough or shortness of breath. ?You do not get any relief from your pain. ?Summary ?An intercostal nerve block is a procedure to block pain in your chest or rib. ?You may need this procedure to block pain during a procedure or to block long-term pain from a chronic  condition. ?Your health care provider will inject a numbing medicine (local anesthetic) into the space between your ribs where there are nerves called intercostal nerves. ?If long-term pain is being treated, a long-lasting anti-inflammatory medicine (steroid) may also be injected along with the numbing medicine. ?If you are being treated for long-term pain, some pain may return when the anesthetic medicine wears off. It may take a few days to feel pain relief from the steroid medicine. ?This information is not intended to replace advice given to you by your health care provider. Make sure you discuss any questions you have with your health care provider. ?Document Revised: 12/15/2019 Document Reviewed: 12/15/2019 ?Elsevier Patient Education ? [place]. ? ?

## 2021-12-19 IMAGING — CT CT CHEST W/ CM
2 of 4 series · 14 of 36 positions shown, 17 images · IV contrast (omnipaque)
Comparison: Most recent CT chest 02/04/2021.  07/03/2020 PET-CT.

CLINICAL DATA: Primary Cancer Type: Lung
TECHNIQUE: Multidetector CT imaging of the chest was performed during
intravenous contrast administration.

CONTRAST:  60mL OMNIPAQUE IOHEXOL 350 MG/ML SOLN

[Series 2: axial st · axial · 0.62mm/px · z∈[-266,-30]mm · 11 of 140 slices shown, 14 images]
[im 11/140  mediastinal]
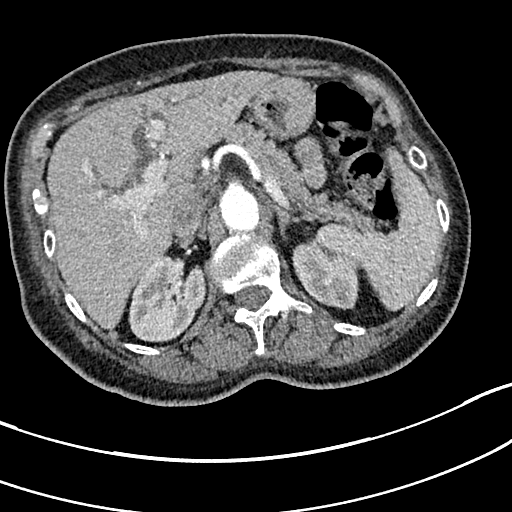
[im 11/140  lung]
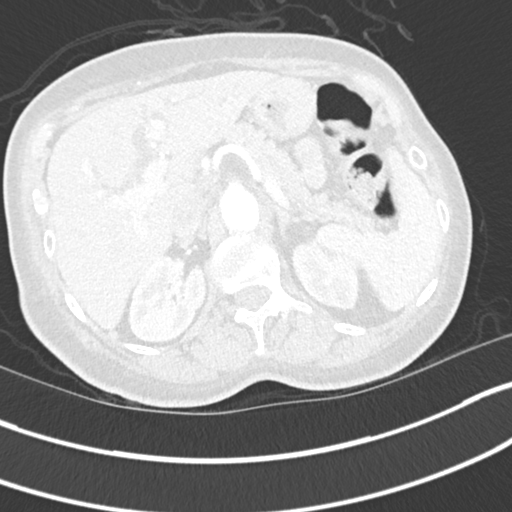
[im 22/140  lung]
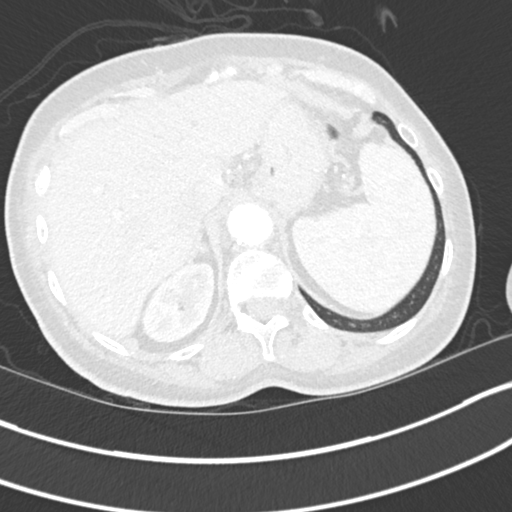
[im 33/140  lung]
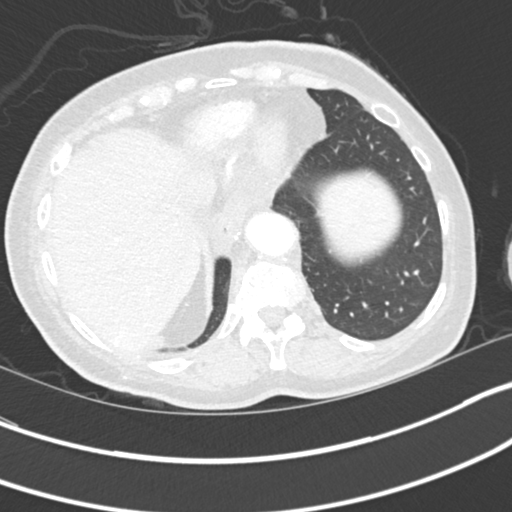
[im 43/140  lung]
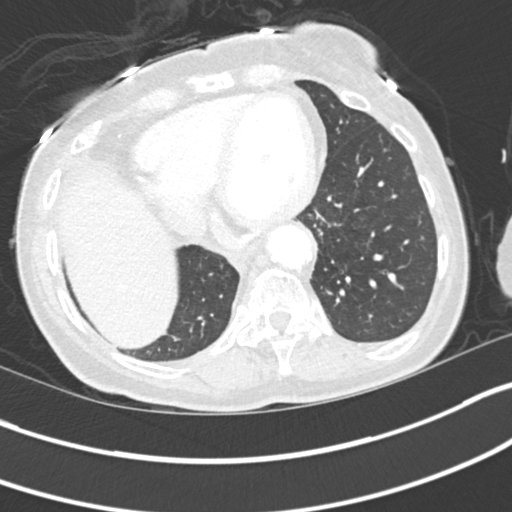
[im 54/140  mediastinal]
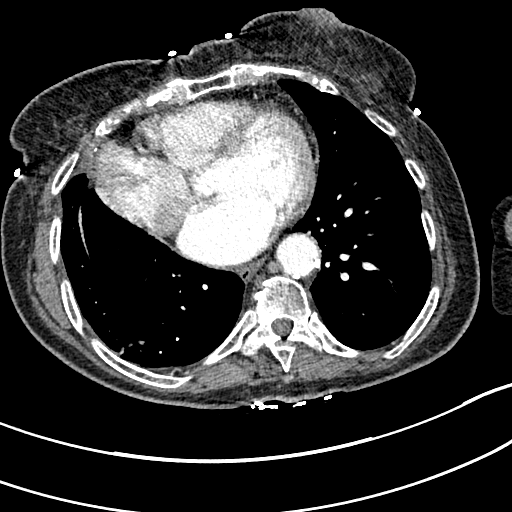
[im 54/140  lung]
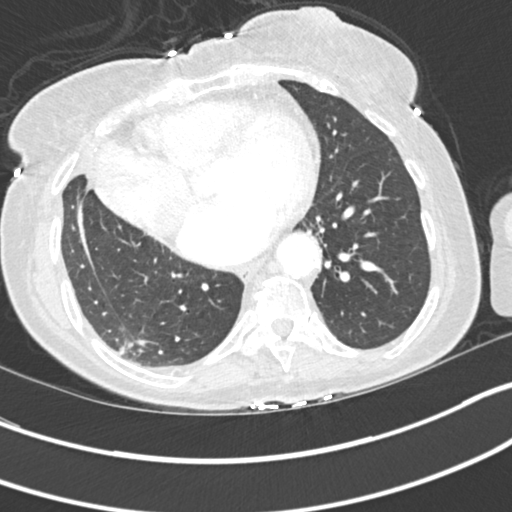
[im 75/140  lung]
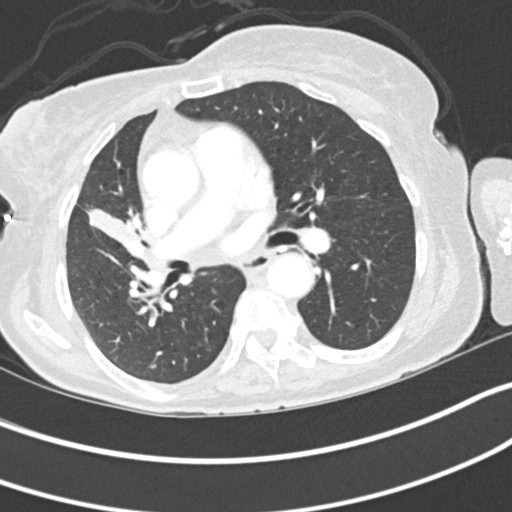
[im 86/140  lung]
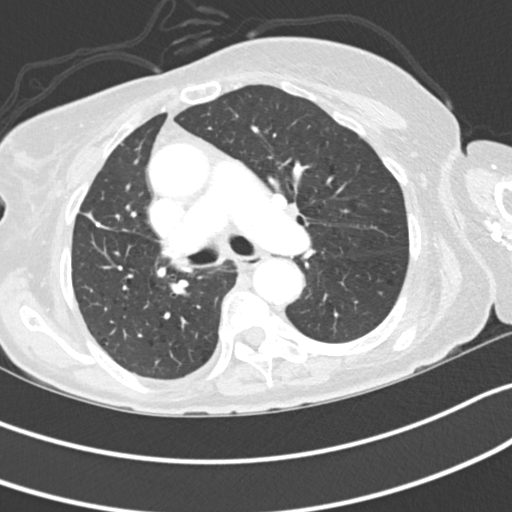
[im 97/140  lung]
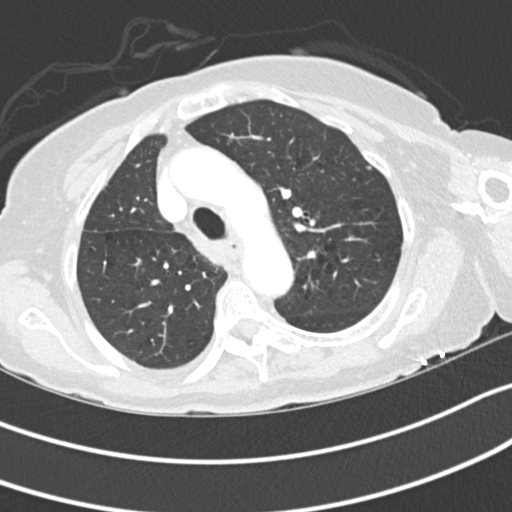
[im 107/140  mediastinal]
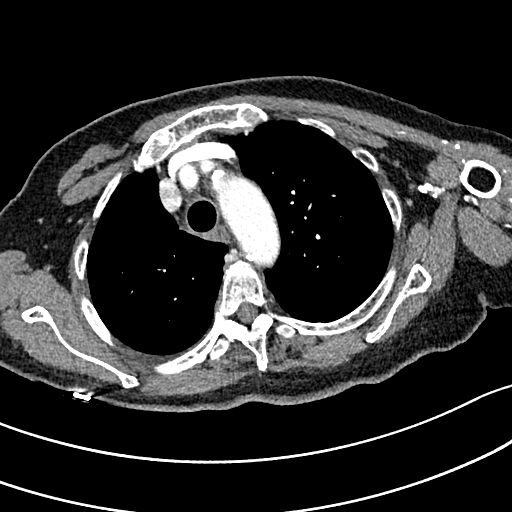
[im 107/140  lung]
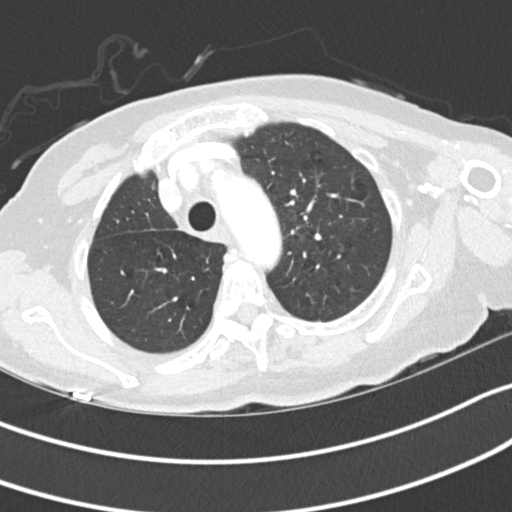
[im 118/140  lung]
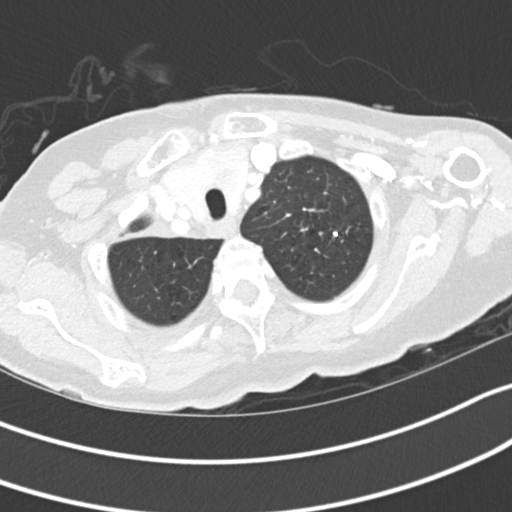
[im 129/140  lung]
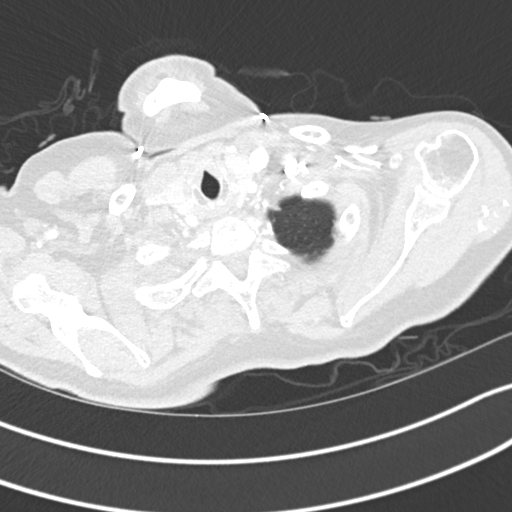

[Series 5: coronal · coronal · 0.63mm/px · 3 of 115 slices shown]
[im 23/115  lung]
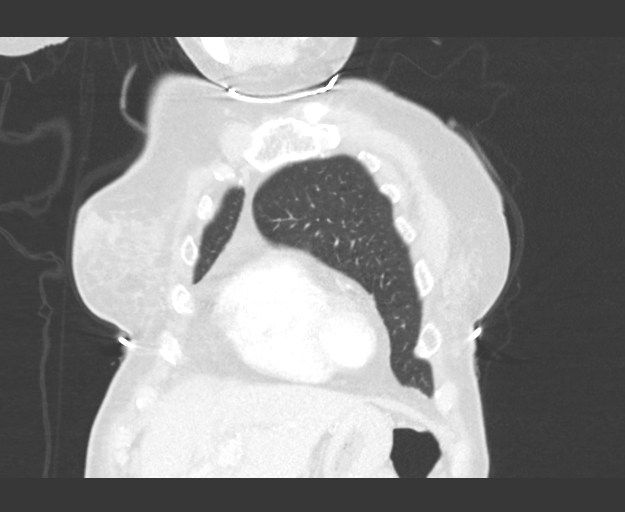
[im 46/115  lung]
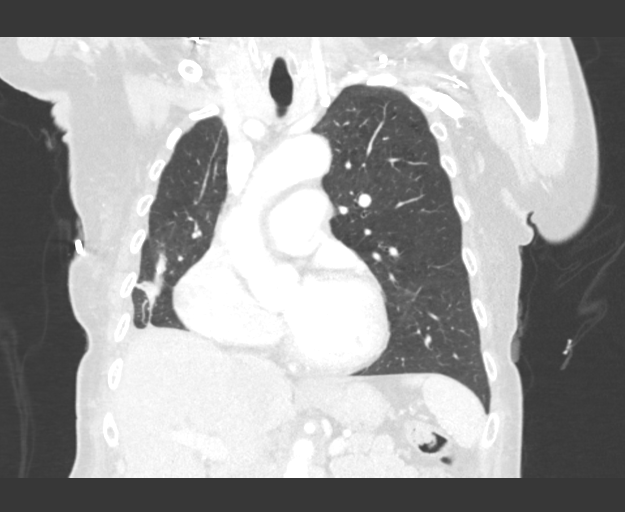
[im 69/115  lung]
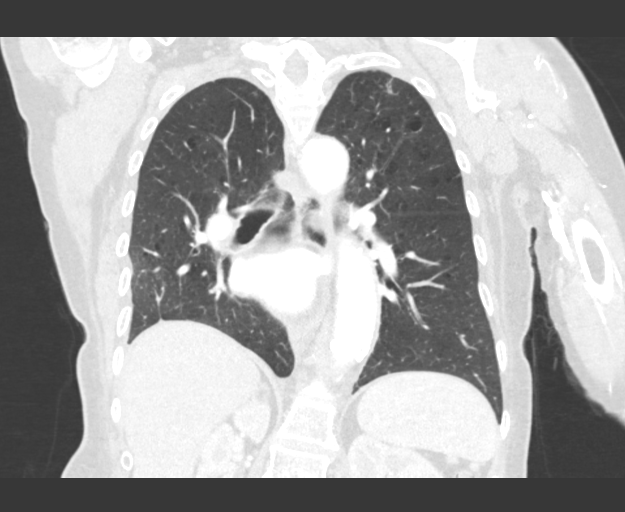

[14 of 36 positions shown; findings below may reference images not displayed]

Imaging Indication: Routine surveillance

Interval therapy since last imaging? No

Initial Cancer Diagnosis

Date: 08/26/2020; Established by: Biopsy-proven

Detailed Pathology: Stage IIB non-small cell lung cancer,
adenocarcinoma.

Primary Tumor location: Right upper lobe.   Left apical nodule.

Surgeries: Right upper lobectomy 08/26/2020. Hysterectomy,
appendectomy.

Chemotherapy: Yes; Ongoing? No; Most recent administration:
01/17/2021

Immunotherapy? No

Radiation therapy? No

EXAM:
CT CHEST WITH CONTRAST
FINDINGS: Cardiovascular: Heart size is increased. No pericardial effusion.
Mild atherosclerotic calcification is noted in the wall of the
thoracic aorta.

Mediastinum/Nodes: No mediastinal lymphadenopathy. There is no hilar
lymphadenopathy. The esophagus has normal imaging features. There is
no axillary lymphadenopathy.

Lungs/Pleura: Volume loss right hemithorax consistent with prior
right upper lobectomy. Staple line in the parahilar right lung with
adjacent scarring is stable. Architectural distortion and scarring
at the right base is unchanged. Previously measured 8 x 6 mm left
apical nodule is stable at 8 x 6 mm today on image [DATE]. Adjacent
fiducial markers evident. Tiny peripheral left upper lobe nodule on
44/7 is unchanged. No new suspicious nodule or mass. No focal
airspace consolidation. No pleural effusion.

Upper Abdomen: Similar appearance of intra and extrahepatic biliary
duct dilatation. Tiny focus of subcapsular hyperenhancement seen
previously in the dome of the liver is less prominent today on image
98/2. 2.9 cm low-density lesion upper pole left kidney has been
incompletely visualized, approaching water attenuation likely a
cyst.

Musculoskeletal: No worrisome lytic or sclerotic osseous
abnormality.
IMPRESSION: 1. No new or progressive interval findings to suggest recurrent or
metastatic disease.
2. Tiny spiculated left upper lobe pulmonary nodule is unchanged.
3. Interval resolution right pleural effusion.
4. The tiny focus of hyperenhancement identified in the subcapsular
hepatic dome previously persists but is less prominent today. Likely
benign, attention on follow-up recommended.
5. Stable intra and extrahepatic biliary duct dilatation.
6.  Aortic Atherosclerois ([id number])

## 2021-12-22 ENCOUNTER — Ambulatory Visit: Payer: Medicare HMO | Admitting: Psychology

## 2022-01-02 ENCOUNTER — Other Ambulatory Visit: Payer: Self-pay | Admitting: Thoracic Surgery (Cardiothoracic Vascular Surgery)

## 2022-01-02 MED ORDER — GABAPENTIN 100 MG PO CAPS: 100.0000 mg | ORAL_CAPSULE | Freq: Three times a day (TID) | ORAL | 3 refills | Status: DC

## 2022-02-13 ENCOUNTER — Ambulatory Visit (HOSPITAL_COMMUNITY)
Admission: RE | Admit: 2022-02-13 | Discharge: 2022-02-13 | Disposition: A | Discharge status: Home / Self Care | Payer: Medicare HMO | Source: Ambulatory Visit | Attending: Internal Medicine | Admitting: Internal Medicine

## 2022-02-13 ENCOUNTER — Inpatient Hospital Stay: Payer: Medicare HMO | Attending: Internal Medicine

## 2022-02-13 ENCOUNTER — Other Ambulatory Visit: Payer: Self-pay

## 2022-02-13 DIAGNOSIS — C3490 Malignant neoplasm of unspecified part of unspecified bronchus or lung: Secondary | ICD-10-CM | POA: Diagnosis present

## 2022-02-13 DIAGNOSIS — Z85118 Personal history of other malignant neoplasm of bronchus and lung: Secondary | ICD-10-CM | POA: Insufficient documentation

## 2022-02-13 DIAGNOSIS — Z08 Encounter for follow-up examination after completed treatment for malignant neoplasm: Secondary | ICD-10-CM | POA: Insufficient documentation

## 2022-02-13 LAB — CBC WITH DIFFERENTIAL (CANCER CENTER ONLY)
Abs Immature Granulocytes: 0.01 10*3/uL (ref 0.00–0.07)
Basophils Absolute: 0 10*3/uL (ref 0.0–0.1)
Basophils Relative: 1 %
Eosinophils Absolute: 0.8 10*3/uL — ABNORMAL HIGH (ref 0.0–0.5)
Eosinophils Relative: 14 %
HCT: 34.9 % — ABNORMAL LOW (ref 36.0–46.0)
Hemoglobin: 11.3 g/dL — ABNORMAL LOW (ref 12.0–15.0)
Immature Granulocytes: 0 %
Lymphocytes Relative: 26 %
Lymphs Abs: 1.5 10*3/uL (ref 0.7–4.0)
MCH: 31.8 pg (ref 26.0–34.0)
MCHC: 32.4 g/dL (ref 30.0–36.0)
MCV: 98.3 fL (ref 80.0–100.0)
Monocytes Absolute: 0.6 10*3/uL (ref 0.1–1.0)
Monocytes Relative: 10 %
Neutro Abs: 2.7 10*3/uL (ref 1.7–7.7)
Neutrophils Relative %: 49 %
Platelet Count: 198 10*3/uL (ref 150–400)
RBC: 3.55 MIL/uL — ABNORMAL LOW (ref 3.87–5.11)
RDW: 11.6 % (ref 11.5–15.5)
WBC Count: 5.5 10*3/uL (ref 4.0–10.5)
nRBC: 0 % (ref 0.0–0.2)

## 2022-02-13 LAB — CMP (CANCER CENTER ONLY)
ALT: 11 U/L (ref 0–44)
AST: 16 U/L (ref 15–41)
Albumin: 4 g/dL (ref 3.5–5.0)
Alkaline Phosphatase: 88 U/L (ref 38–126)
Anion gap: 5 (ref 5–15)
BUN: 19 mg/dL (ref 8–23)
CO2: 26 mmol/L (ref 22–32)
Calcium: 9.7 mg/dL (ref 8.9–10.3)
Chloride: 109 mmol/L (ref 98–111)
Creatinine: 1.21 mg/dL — ABNORMAL HIGH (ref 0.44–1.00)
GFR, Estimated: 47 mL/min — ABNORMAL LOW (ref >60)
Glucose, Bld: 110 mg/dL — ABNORMAL HIGH (ref 70–99)
Potassium: 4.5 mmol/L (ref 3.5–5.1)
Sodium: 140 mmol/L (ref 135–145)
Total Bilirubin: 0.2 mg/dL — ABNORMAL LOW (ref 0.3–1.2)
Total Protein: 7.1 g/dL (ref 6.5–8.1)

## 2022-02-13 MED ORDER — IOHEXOL 300 MG/ML  SOLN
75.0000 mL | Freq: Once | INTRAMUSCULAR | Status: AC | PRN
  Administered 2022-02-13: 75 mL via INTRAVENOUS

## 2022-02-13 MED ORDER — SODIUM CHLORIDE (PF) 0.9 % IJ SOLN
INTRAMUSCULAR | Status: AC
  Filled 2022-02-13: qty 50

## 2022-02-16 ENCOUNTER — Other Ambulatory Visit: Payer: Self-pay | Admitting: Internal Medicine

## 2022-02-16 ENCOUNTER — Other Ambulatory Visit: Payer: Self-pay | Admitting: Physical Medicine and Rehabilitation

## 2022-02-17 ENCOUNTER — Encounter: Payer: Self-pay | Admitting: Internal Medicine

## 2022-02-17 ENCOUNTER — Other Ambulatory Visit: Payer: Self-pay

## 2022-02-17 ENCOUNTER — Inpatient Hospital Stay: Payer: Medicare HMO | Admitting: Internal Medicine

## 2022-02-17 VITALS — BP 126/75 | HR 73 | Temp 98.7°F | Resp 16 | Wt 161.6 lb

## 2022-02-17 DIAGNOSIS — C3490 Malignant neoplasm of unspecified part of unspecified bronchus or lung: Secondary | ICD-10-CM

## 2022-02-17 DIAGNOSIS — C3491 Malignant neoplasm of unspecified part of right bronchus or lung: Secondary | ICD-10-CM | POA: Diagnosis not present

## 2022-02-17 DIAGNOSIS — Z85118 Personal history of other malignant neoplasm of bronchus and lung: Secondary | ICD-10-CM | POA: Diagnosis present

## 2022-02-17 DIAGNOSIS — Z08 Encounter for follow-up examination after completed treatment for malignant neoplasm: Secondary | ICD-10-CM | POA: Diagnosis present

## 2022-02-17 NOTE — Progress Notes (Signed)
[place] [phone]   Fax:([phone]) [place], [place] [address]  DIAGNOSIS: Stage IIB (T3, N0, M0) non-small cell lung cancer, adenocarcinoma involving the right upper lobe. The patient also has evidence for adenocarcinoma of the left upper lobe lung nodule as well as subcentimeter pulmonary nodules in the right lung concerning for stage IV lung cancer with multifocal disease bilaterally.  Biomarker Findings  Microsatellite status - MS-Equivocal ? Tumor Mutational Burden - Cannot Be Determined Genomic Findings For a complete list of the genes assayed, please refer to the Appendix. MET [id number]*2 AXL T343M TP53 I254V 7 Disease relevant genes with no reportable alterations: ALK, BRAF, EGFR, ERBB2, KRAS, RET, ROS1  PDL1 Expression 20 %.  PRIOR THERAPY:  1) status post right upper lobectomy with lymph node sampling on August 26, 2020 under the care of Dr. Kipp Brood. 2) Adjuvant systemic chemotherapy with cisplatin 75 mg/M2 and Alimta 550/M2 every 3 weeks. Last dose 01/17/21.  Status post 4 cycles   CURRENT THERAPY: Observation   INTERVAL HISTORY: Tonya [name] 76 y.o. female returns to the clinic today for follow-up visit accompanied by her husband.  The patient is feeling fine today with no concerning complaints except for the intermittent pain on the right side of the chest from the surgical scar.  She is currently on gabapentin.  She denied having any shortness of breath, cough or hemoptysis.  She denied having any fever or chills.  She has no nausea, vomiting, diarrhea or constipation.  She has no headache or visual changes.  She is here today for evaluation with repeat CT scan of the chest for restaging of her disease.   MEDICAL HISTORY: Past Medical History:  Diagnosis Date   Adenoma of left adrenal gland    Cancer ([facility])    Lung   Colon polyps    hyperplastic    Complication of anesthesia    Very emotional and cries after anesthesia   GERD (gastroesophageal reflux disease)    High blood pressure 03/25/2021   Kidney stone    Liver lesion    Microhematuria    Stroke ([facility])     ALLERGIES:  is allergic to other, penicillins, and tizanidine.  MEDICATIONS:  Current Outpatient Medications  Medication Sig Dispense Refill   acetaminophen (TYLENOL) 325 MG tablet Take 1-2 tablets (325-650 mg total) by mouth every 4 (four) hours as needed for mild pain.     albuterol (VENTOLIN HFA) 108 (90 Base) MCG/ACT inhaler Inhale 2 puffs into the lungs every 6 (six) hours as needed for wheezing or shortness of breath. 8 g 2   alendronate (FOSAMAX) 70 MG tablet Take 70 mg by mouth every Saturday.     ALPRAZolam (XANAX) 1 MG tablet Take 1 mg by mouth at bedtime as needed for sleep.     Calcium Carbonate-Vitamin D (CALCIUM + D PO) Take 1 tablet by mouth 2 (two) times daily.     folic acid (FOLVITE) 1 MG tablet TAKE 1 TABLET(1 MG) BY MOUTH DAILY 30 tablet 4   gabapentin (NEURONTIN) 100 MG capsule Take 1 capsule (100 mg total) by mouth 3 (three) times daily. 90 capsule 3   HYDROcodone-acetaminophen (NORCO) 10-325 MG tablet Take 1 tablet by mouth every 6 (six) hours as needed for moderate pain.     lidocaine (LIDODERM) 5 % Place 3 patches onto the skin daily. Apply at 8 am and remove at 8 pm  daily. (Patient taking differently: Place 3 patches onto the skin as needed. Apply at 8 am and remove at 8 pm daily.) 90 patch 0   magnesium oxide (MAG-OX) 400 (241.3 Mg) MG tablet Take 1 tablet (400 mg total) by mouth 2 (two) times daily. (Patient not taking: Reported on 11/06/2021) 60 tablet 1   Menthol-Methyl Salicylate (MUSCLE RUB) 10-15 % CREA Apply 1 application topically 2 (two) times daily. (Patient taking differently: Apply 1 application topically daily as needed for muscle pain.)  0   NARCAN 4 MG/0.1ML LIQD nasal spray kit 1 spray once.     omeprazole (PRILOSEC) 40 MG capsule Take  40 mg by mouth daily.     rosuvastatin (CRESTOR) 10 MG tablet Take 10 mg by mouth at bedtime.     umeclidinium-vilanterol (ANORO ELLIPTA) 62.5-25 MCG/INH AEPB Inhale 1 puff into the lungs daily. (Patient not taking: Reported on 11/06/2021) 60 each 0   No current facility-administered medications for this visit.    SURGICAL HISTORY:  Past Surgical History:  Procedure Laterality Date   ABDOMINAL HYSTERECTOMY     APPENDECTOMY     BRONCHIAL BIOPSY  07/18/2020   Procedure: BRONCHIAL BIOPSIES;  Surgeon: [name], DO;  Location: [facility];  Service: Pulmonary;;   BRONCHIAL BRUSHINGS  07/18/2020   Procedure: BRONCHIAL BRUSHINGS;  Surgeon: [name], DO;  Location: [facility];  Service: Pulmonary;;   BRONCHIAL NEEDLE ASPIRATION BIOPSY  07/18/2020   Procedure: BRONCHIAL NEEDLE ASPIRATION BIOPSIES;  Surgeon: [name], DO;  Location: [facility];  Service: Pulmonary;;   BRONCHIAL WASHINGS  07/18/2020   Procedure: BRONCHIAL WASHINGS;  Surgeon: [name], DO;  Location: [facility];  Service: Pulmonary;;   FIDUCIAL MARKER PLACEMENT  07/18/2020   Procedure: FIDUCIAL MARKER PLACEMENT;  Surgeon: [name], DO;  Location: [facility];  Service: Pulmonary;;   INTERCOSTAL NERVE BLOCK Right 08/26/2020   Procedure: INTERCOSTAL NERVE BLOCK;  Surgeon: [name], MD;  Location: [facility];  Service: Thoracic;  Laterality: Right;   IR THORACENTESIS ASP PLEURAL SPACE W/IMG GUIDE  10/10/2020   NODE DISSECTION Right 08/26/2020   Procedure: NODE DISSECTION;  Surgeon: [name], MD;  Location: [facility];  Service: Thoracic;  Laterality: Right;   THORACENTESIS Right 11/04/2020   Procedure: [facility];  Surgeon: [name], DO;  Location: [facility];  Service: Pulmonary;  Laterality: Right;   THORACENTESIS Right 11/29/2020   Procedure: THORACENTESIS;  Surgeon: [name], DO;  Location: [facility];  Service: Pulmonary;  Laterality: Right;   VIDEO  BRONCHOSCOPY WITH ENDOBRONCHIAL NAVIGATION N/A 07/18/2020   Procedure: VIDEO BRONCHOSCOPY WITH ENDOBRONCHIAL NAVIGATION;  Surgeon: [name], DO;  Location: [facility];  Service: Pulmonary;  Laterality: N/A;   VIDEO BRONCHOSCOPY WITH ENDOBRONCHIAL ULTRASOUND  07/18/2020   Procedure: VIDEO BRONCHOSCOPY WITH ENDOBRONCHIAL ULTRASOUND;  Surgeon: [name], DO;  Location: [facility];  Service: Pulmonary;;    REVIEW OF SYSTEMS:  A comprehensive review of systems was negative except for: Respiratory: positive for pleurisy/chest pain   PHYSICAL EXAMINATION: General appearance: alert, cooperative, and no distress Head: Normocephalic, without obvious abnormality, atraumatic Neck: no adenopathy, no JVD, supple, symmetrical, trachea midline, and thyroid not enlarged, symmetric, no tenderness/mass/nodules Lymph nodes: Cervical, supraclavicular, and axillary nodes normal. Resp: clear to auscultation bilaterally Back: symmetric, no curvature. ROM normal. No CVA tenderness. Cardio: regular rate and rhythm, S1, S2 normal, no murmur, click, rub or gallop GI: soft, non-tender; bowel sounds normal; no masses,  no organomegaly Extremities:  extremities normal, atraumatic, no cyanosis or edema  ECOG PERFORMANCE STATUS: 1 - Symptomatic but completely ambulatory  Blood pressure 126/75, pulse 73, temperature 98.7 F (37.1 C), temperature source Oral, resp. rate 16, weight 161 lb 9 oz (73.3 kg), SpO2 100 %.  LABORATORY DATA: Lab Results  Component Value Date   WBC 5.5 02/13/2022   HGB 11.3 (L) 02/13/2022   HCT 34.9 (L) 02/13/2022   MCV 98.3 02/13/2022   PLT 198 02/13/2022      Chemistry      Component Value Date/Time   NA 140 02/13/2022 0953   K 4.5 02/13/2022 0953   CL 109 02/13/2022 0953   CO2 26 02/13/2022 0953   BUN 19 02/13/2022 0953   CREATININE 1.21 (H) 02/13/2022 0953      Component Value Date/Time   CALCIUM 9.7 02/13/2022 0953   ALKPHOS 88 02/13/2022 0953   AST 16  02/13/2022 0953   ALT 11 02/13/2022 0953   BILITOT 0.2 (L) 02/13/2022 0953       RADIOGRAPHIC STUDIES: CT Chest W Contrast  Result Date: 02/16/2022 CLINICAL DATA:  76 year old female with history of non-small cell lung cancer. Follow-up evaluation. * Tracking Code: BO * EXAM: CT CHEST WITH CONTRAST TECHNIQUE: Multidetector CT imaging of the chest was performed during intravenous contrast administration. RADIATION DOSE REDUCTION: This exam was performed according to the departmental dose-optimization program which includes automated exposure control, adjustment of the mA and/or kV according to patient size and/or use of iterative reconstruction technique. CONTRAST:  50mL OMNIPAQUE IOHEXOL 300 MG/ML  SOLN COMPARISON:  Multiple priors, most recently chest CT 08/07/2021. FINDINGS: Cardiovascular: Heart size is mildly enlarged. There is no significant pericardial fluid, thickening or pericardial calcification. Aortic atherosclerosis. No definite coronary artery calcifications. Mediastinum/Nodes: No pathologically enlarged mediastinal or hilar lymph nodes. Esophagus is unremarkable in appearance. No axillary lymphadenopathy. Lungs/Pleura: Status post right upper lobectomy. Compensatory hyperexpansion of the right middle and lower lobes. Suture line along the posterior aspect of the right middle lobe with adjacent chronic soft tissue thickening, stable compared to prior examinations, most compatible with areas of chronic scarring. 8 mm spiculated nodule in the left upper lobe near the apex (axial image 25 of series 7), stable compared to the prior study. Adjacent fiducial marker in the left upper lobe noted. No other new suspicious appearing pulmonary nodules or masses are noted. No acute consolidative airspace disease. No pleural effusions. Upper Abdomen: Aortic atherosclerosis. Well-defined low-attenuation lesion in the left kidney measuring 3.7 cm in diameter, incompletely visualized and therefore not  characterized, but statistically likely a simple cyst (no imaging follow-up is recommended). Persistent dilatation of the common bile duct measuring 1.3 cm in diameter, with severe intrahepatic biliary ductal dilatation. Musculoskeletal: There are no aggressive appearing lytic or blastic lesions noted in the visualized portions of the skeleton. IMPRESSION: 1. Stable findings, including chronic postoperative scarring adjacent to a suture line in the posterior aspect of the right middle lobe, and stable small aggressive appearing nodule in the apex of the left upper lobe adjacent to fiducial markers. No new suspicious appearing pulmonary nodules or masses are noted. 2. 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 Further evaluation with repeat abdominal MRI with and without IV gadolinium with MRCP should be considered if there is any clinical evidence for biliary tract obstruction. 3. Aortic atherosclerosis. 4. Mild cardiomegaly. Aortic Atherosclerosis (  ICD10-I70.0). Electronically Signed   By: Vinnie Langton M.D.   On: 02/16/2022 08:29     ASSESSMENT AND PLAN: This is a very pleasant 76 years old African-American female recently diagnosed with a stage IIb (T3, N0, M0) non-small cell lung cancer, adenocarcinoma involving the right upper lobe with 2 nodules. The patient also has evidence for adenocarcinoma of the left upper lobe and subcentimeter nodules in the right lung concerning for stage IV with multifocal disease bilaterally. She is status post right upper lobectomy with lymph node dissection on August 26, 2020 under the care of Dr. Kipp Brood. Her molecular studies by foundation 1 showed no actionable mutations. She has PD-L1 expression of 20%. She underwent adjuvant treatment with systemic chemotherapy with cisplatin 75 mg/M2 and  Alimta 500 mg/M2 every 3 weeks status post 4 cycles, completed Jan 17, 2021. The patient is currently on observation and she is feeling fine with no concerning complaints. She had repeat CT scan of the chest performed recently.  I personally and independently reviewed the scan and discussed the result with the patient and her husband. There is no concerning findings for disease recurrence or metastasis. I recommended for the patient to continue on observation with repeat CT scan of the chest in 6 months. The patient was advised to call immediately if she has any other concerning symptoms in the interval. The patient voices understanding of current disease status and treatment options and is in agreement with the current care plan.  All questions were answered. The patient knows to call the clinic with any problems, questions or concerns. We can certainly see the patient much sooner if necessary.  Disclaimer: This note was dictated with voice recognition software. Similar sounding words can inadvertently be transcribed and may not be corrected upon review.

## 2022-02-27 ENCOUNTER — Other Ambulatory Visit: Payer: Self-pay | Admitting: Physician Assistant

## 2022-03-16 ENCOUNTER — Ambulatory Visit: Payer: Medicare HMO | Admitting: Adult Health

## 2022-03-22 IMAGING — CT CT CHEST W/ CM
2 of 4 series · 14 of 36 positions shown, 17 images · IV contrast (OMNIPAQUE)
Comparison: Most recent CT chest 05/06/2021.  07/03/2020 PET-CT.

CLINICAL DATA: Primary Cancer Type: Lung
TECHNIQUE: Multidetector CT imaging of the chest was performed during
intravenous contrast administration.

CONTRAST:  60mL OMNIPAQUE IOHEXOL 350 MG/ML SOLN

[Series 2: axial st · axial · 0.75mm/px · z∈[+1510,+1770]mm · 11 of 152 slices shown, 14 images]
[im 11/152  mediastinal]
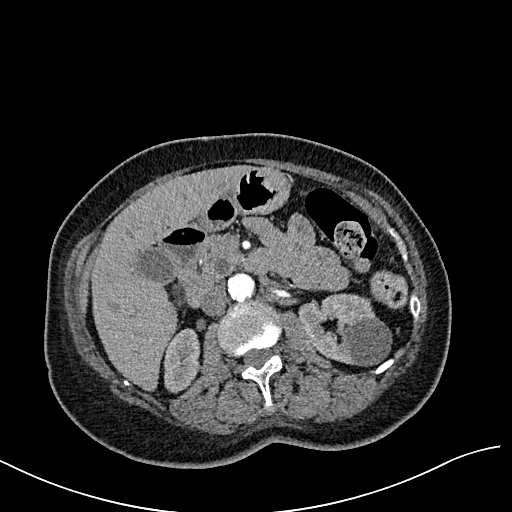
[im 11/152  lung]
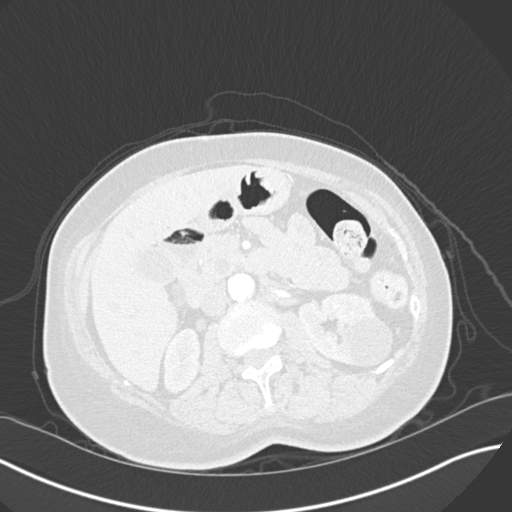
[im 22/152  lung]
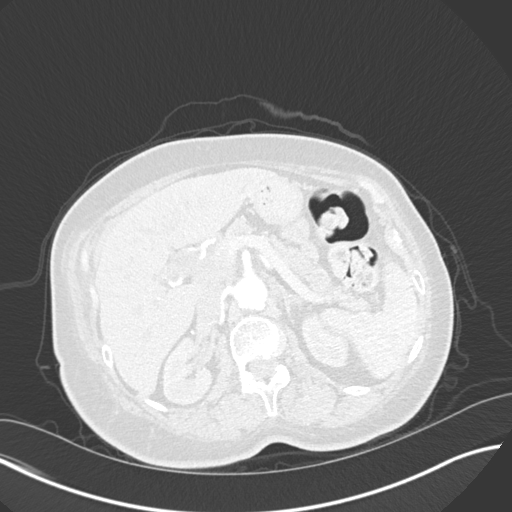
[im 33/152  lung]
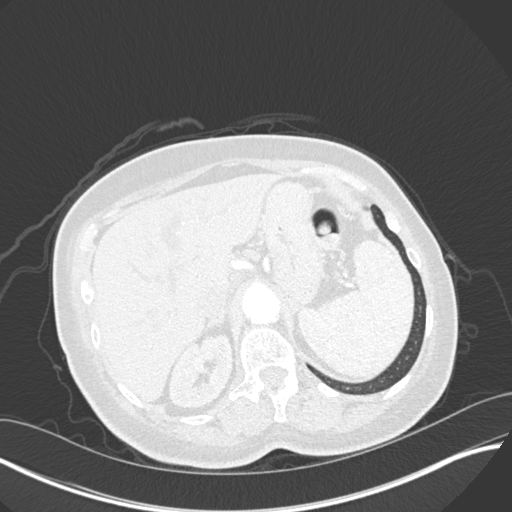
[im 54/152  lung]
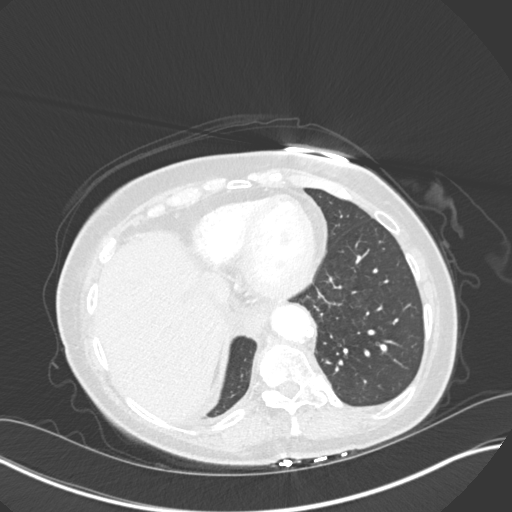
[im 65/152  mediastinal]
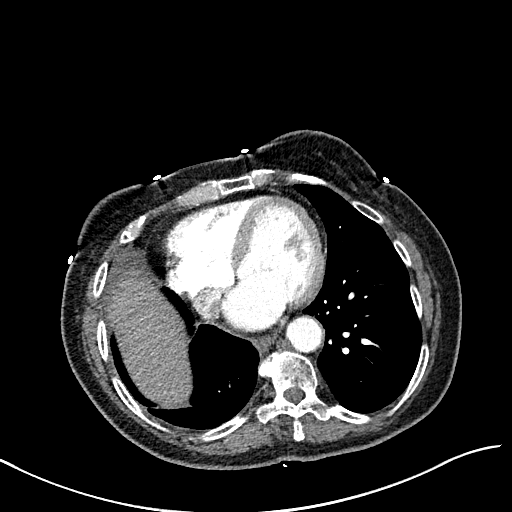
[im 65/152  lung]
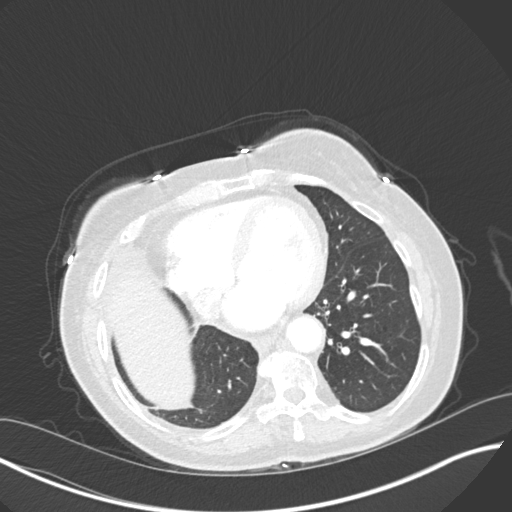
[im 76/152  lung]
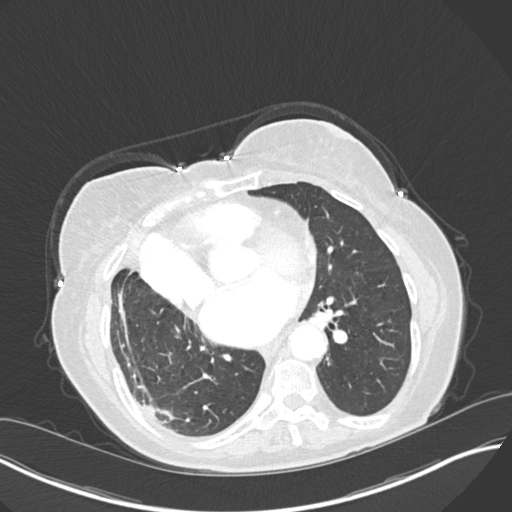
[im 87/152  lung]
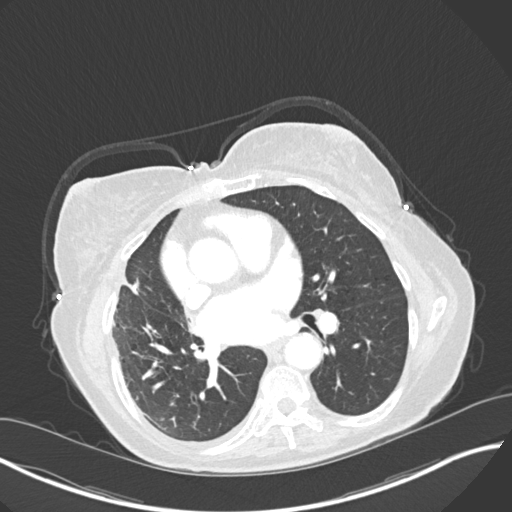
[im 98/152  lung]
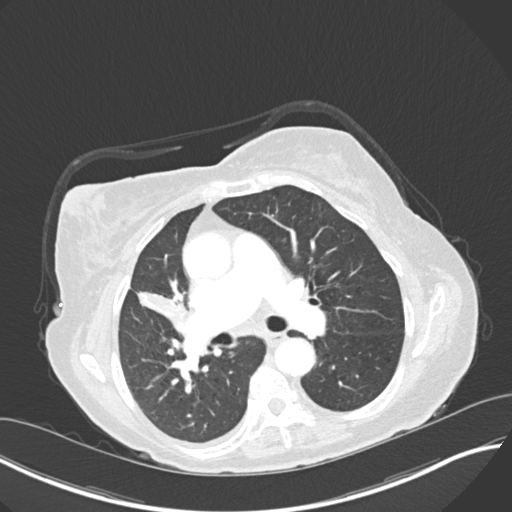
[im 119/152  mediastinal]
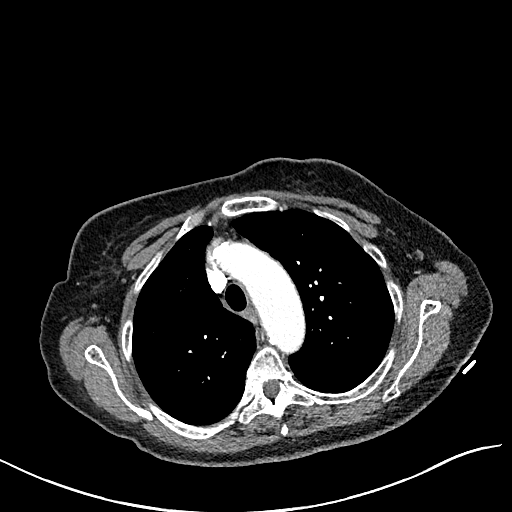
[im 119/152  lung]
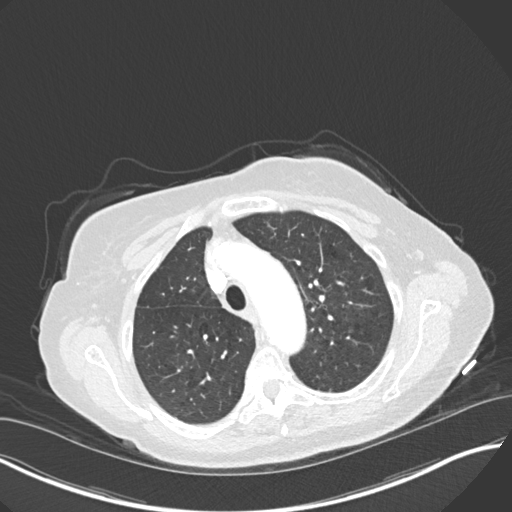
[im 130/152  lung]
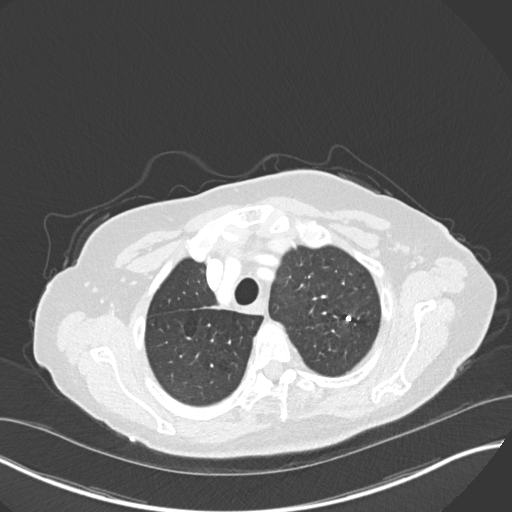
[im 141/152  lung]
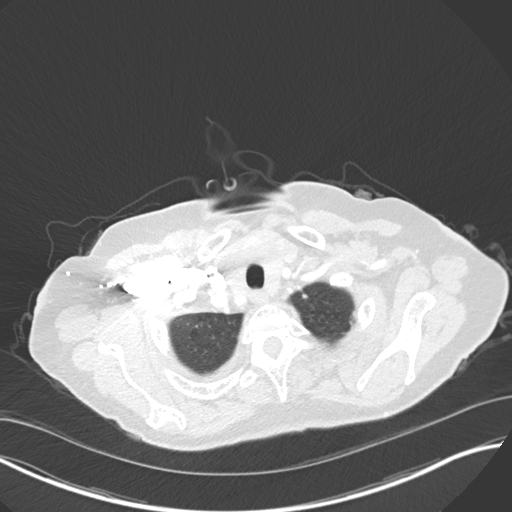

[Series 6: coronal · coronal · 0.68mm/px · 3 of 116 slices shown]
[im 24/116  lung]
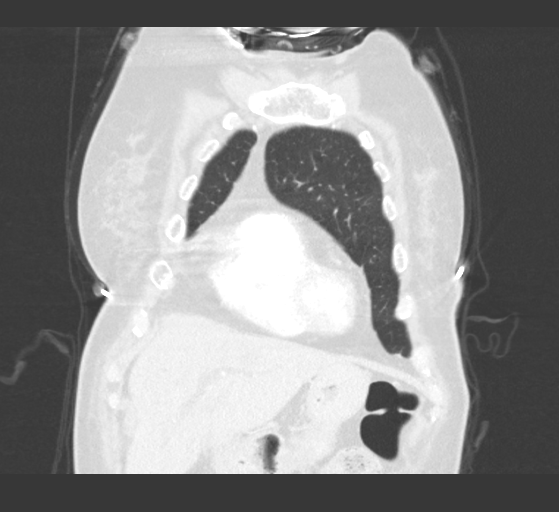
[im 47/116  lung]
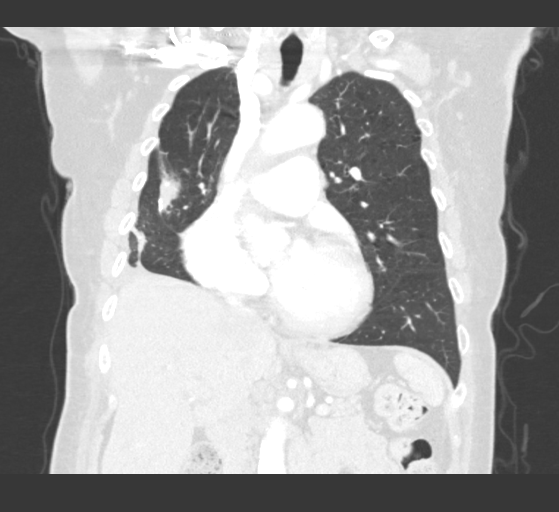
[im 70/116  lung]
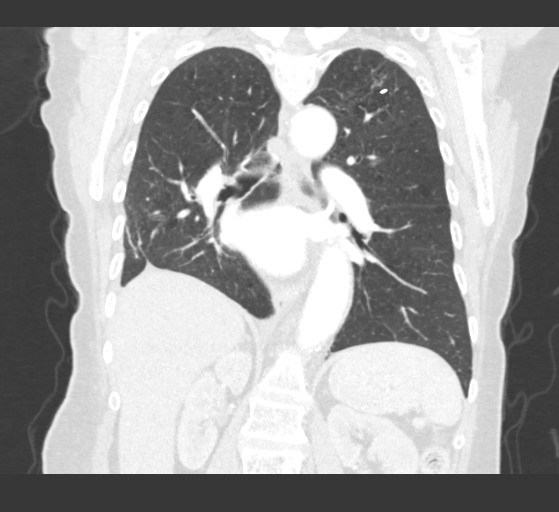

[14 of 36 positions shown; findings below may reference images not displayed]

Imaging Indication: Routine surveillance

Interval therapy since last imaging? No

Initial Cancer Diagnosis

Date: 08/26/2020; Established by: Biopsy-proven

Detailed Pathology: Stage IIB non-small cell lung cancer,
adenocarcinoma.

Primary Tumor location:  Right upper lobe. Left apical nodule.

Surgeries: Right upper lobectomy 08/26/2020. Hysterectomy,
appendectomy.

Chemotherapy: Yes; Ongoing? No; Most recent administration:
01/17/2021

Immunotherapy? No

Radiation therapy? No

EXAM:
CT CHEST WITH CONTRAST
FINDINGS: Cardiovascular: The heart size is normal. No substantial pericardial
effusion. Enlargement of the pulmonary outflow tract and main
pulmonary arteries suggests pulmonary arterial hypertension. Mild
atherosclerotic calcification is noted in the wall of the thoracic
aorta. Similar appearance 2.4 cm left thyroid nodule.

Mediastinum/Nodes: No mediastinal lymphadenopathy. There is no hilar
lymphadenopathy. The esophagus has normal imaging features. There is
no axillary lymphadenopathy.

Lungs/Pleura: Centrilobular and paraseptal emphysema evident. Prior
right upper lobectomy. Stable volume loss right hemithorax. Staple
line parahilar right lung with associated soft tissue density is
stable in the interval measuring approximately 12 mm thickness
today, unchanged. Peripheral scarring again noted right lower lobe.
Spiculated left apical nodule measured previously at 8 x 6 mm is
unchanged at 8 x 6 mm today. Adjacent fiducial markers again noted.
Tiny peripheral left upper lobe nodule identified on the previous
study is stable on image 43/series 5 today. No new suspicious nodule
or mass.

Upper Abdomen: Tiny focus of hyperenhancement in the dome of the
liver on the previous study is stable on image 92/2 today. Images of
the upper abdomen show persistent intra and extrahepatic biliary
duct dilatation, similar to prior. Common bile duct measures 9 mm
diameter. 3.8 cm interpolar left renal cyst is stable. Tiny
nonobstructing stones again noted upper pole left kidney and lower
pole right kidney.

Musculoskeletal: No worrisome lytic or sclerotic osseous
abnormality.
IMPRESSION: 1. Stable exam. No new or progressive interval findings.
2. Stable appearance of the spiculated left apical nodule with
adjacent fiducial markers.
3. Status post right upper lobectomy with stable appearance of post
treatment scarring in the parahilar right lung.
4. Stable intra and extrahepatic biliary duct dilatation. The tiny
focus of hyperenhancement identified previously in the dome of the
liver is unchanged. Continued attention on follow-up recommended.
5. Bilateral nephrolithiasis.
6. Aortic Atherosclerosis (TLMB4-H0L.L) and Emphysema ([id number].E).

## 2022-03-23 ENCOUNTER — Encounter: Payer: Self-pay | Admitting: Adult Health

## 2022-03-23 ENCOUNTER — Ambulatory Visit: Payer: Medicare HMO | Admitting: Adult Health

## 2022-03-23 VITALS — BP 121/72 | HR 57 | Ht 64.0 in | Wt 170.0 lb

## 2022-03-23 DIAGNOSIS — I69319 Unspecified symptoms and signs involving cognitive functions following cerebral infarction: Secondary | ICD-10-CM | POA: Diagnosis not present

## 2022-03-23 DIAGNOSIS — S06310S Contusion and laceration of right cerebrum without loss of consciousness, sequela: Secondary | ICD-10-CM

## 2022-03-23 DIAGNOSIS — R41842 Visuospatial deficit: Secondary | ICD-10-CM

## 2022-03-23 NOTE — Progress Notes (Signed)
[facility] [address] [place]. [place] [address] [phone]       STROKE FOLLOW UP NOTE  Ms. ROSHINI FULWIDER Date of Birth:  Dec 14, 1945 Medical Record Number:  [id number]   Reason for Referral: stroke follow up    SUBJECTIVE:   CHIEF COMPLAINT:  Chief Complaint  Patient presents with   Follow-up    Rm 3 alone Pt is well, still having some L sided weakness and mentions short term memory concerns.      HPI:   Update 03/23/2022 JM: Patient returns for 21-monthstroke follow-up unaccompanied.  Overall stable without new stroke/TIA symptoms.  Patient concerned regarding continued short-term memory loss and still occasionally bumping in to objects on left side. Denies any residual left sided weakness. Was seen by Dr. GKaty Fitchwith clear exam. She has gradually been returning back to driving. Admits to poor sleep habits, can wake up frequently throughout the night, does take Xanax for sleep. Will nap at times during the day, believes more due to boredom, not overly fatigued during the day, able to do ADLs and IADLs without great difficulty. Denies snoring, witnessed apneas or headaches. Denies any significant depression/anxiety.   Compliant on Crestor, denies side effects.  Blood pressure today 121/72.  Routinely follows with PCP Dr. RAyesha Rumpf Reports prior lab work back in December with PCP (unable to view via epic). Reports f/u with Pcp next month and will likely repeat labs at that time.  No new concerns at this time.     History provided for reference purposes only Update 09/10/2021 JM: Patient returns unaccompanied for 346-monthtroke follow-up.  Presented to ED on 12/9 with headache, OS visual loss, disdiadockokinesia and impaired coordination likely due to recrudescence of prior right parietal ICH vs complicated migraine. MR brain negative for acute infarct or findings.  CTA head/neck unremarkable. GCA normal CRP and minimally elevated ESR. 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 HA tx'd with IV Toradol and Phenergan with improvement in visual acuity return back to baseline on day of discharge. PT/OT eval with mild gait imbalance and referred to outpatient PT/OT.  Stable since that time without new, reoccurring or worsening stroke/TIA symptoms. Denies recurrence of headache. Reports some residual left leg weakness, gait impairment, left peripheral visual impairment, and memory difficulty but overall improving. Evaluated by PT today and will continue for 6 weeks. Completed SLP 12/22. Completed OT 12/20. Has not yet been seen by ophthalmology. Not running in to objects as much on the left.  No additional headaches.   Blood pressure today 114/73 -routinely monitors at home and has been stable  No further concerns at this time  Initial visit 06/11/2021 JM: [name] is being seen for hospital follow-up accompanied by her husband, WaThayer Jew  Cognitive concerns - short term memory - at times delayed recall. Able to maintain ADLs independently and has been slowly returning back to IADLs such as cooking and cleaning.  She questions return to driving Imbalance - stumbling.  Gradually improving.  Ambulates without assistive device Left side visual impairment - bumping into things on left side.  Perceptual difficulties.  Question seeing an ophthalmologist Depression/anxiety -struggling with adjusting to her deficits as she was completely independent prior.  Denies prior history of depression but currently taking Xanax typically only prior to bedtime Denies residual headache - has since discontinued topiramate  Completed home health therapy and started outpatient therapies on 9/22  Blood pressure today 97/60 - does monitor at  home and typically 120s-130s/60s.    Stroke admission 02/22/2021 Ms. ZSOFIA PROUT is a 76 y.o. female w/pmh of GERD, history of bradycardia with ventricular  bigeminy, HLD, Stage IIB (T3, N0, M0) non-small cell lung cancer, adenocarcinoma involving the right upper lobe who initially presented on OSH with AMS and on teleneuro exam, patient nonverbal, unable to follow commands, right gaze deviation and generalized but L>R weakness, found to have a right parietal parasaggital bleed for which she transferred to [facility] on 02/22/2021 for further work up and intervention.  Personally reviewed hospitalization pertinent progress notes, lab work and imaging.  Evaluated by Dr. Leonie Man for right parietal IPH with small frontal subarachnoid hemorrhage with etiology likely hypertensive in nature although recommended repeat MRI 2 to 3 months due to unclear exact etiology of bleed. MRV negative for CVST.  No evidence of vascular malformations or mets contributing to bleed.  No prior HTN history of elevated SBP during hospitalization and initiated lisinopril.  EF 60 to 65%.  LDL 101.  A1c 5.4.  Episode of RUE shaking with long-term EEG continuous generalized and right hemispheric slowing but no seizure activity - Keppra 500 mg twice daily initiated for seizure prophylaxis but eventually discontinued due to possible side effect of hallucinations.  Therapy evaluations recommended CIR for residual left-sided weakness, right gaze preference, balance deficits high-level cognitive deficits and continued hallucinations        PERTINENT IMAGING  08/15/2021 CT HEAD IMPRESSION: No acute abnormality.  Chronic infarct right parietal lobe.  08/15/2021 MR BRAIN IMPRESSION: No evidence of acute intracranial abnormality.   Redemonstrated chronic cortical/subcortical infarct within the right parietal lobe, with associated chronic blood products.   Otherwise unremarkable non-contrast MRI appearance of the brain.   Right mastoid effusion.   08/15/2021 CTA HEAD/NECK IMPRESSION: 1. Negative for intracranial stenosis or large vessel occlusion 2. No significant carotid or vertebral artery  stenosis in the neck. 3. CT head demonstrates no acute abnormality. Chronic infarct right parietal lobe 4. 8 mm spiculated nodule left upper lobe, unchanged from prior chest CT 02/04/2021   02/23/21 CT Head WO IV Contrast 1. Intraparenchymal hematoma centered in the high right parietal lobe with subarachnoid extension over the right convexity and into the basal cisterns. 2. No midline shift or other mass effect.   02/23/21 MR Brain W WO Contrast  Stable appearance of right parietal hemorrhage without evidence of underlying mass lesion or vascular malformation.  Stable small subdural and subarachnoid hemorrhage.  Small amount of blood within the ventricles but no hydrocephalus.   02/23/21 MR Angio Head Neck  WO Contrast  Normal variant MRA of circle of Willis without large vessel stenosis or occlusion or aneurysm.   02/23/21 MR Venogram Head  Normal.  No evidence of venous sinus thrombosis   EEG 6/19 to 6/20  No evidence of seizures      ROS:   14 system review of systems performed and negative with exception of those listed in HPI  PMH:  Past Medical History:  Diagnosis Date   Adenoma of left adrenal gland    Cancer ([place])    Lung   Colon polyps    hyperplastic   Complication of anesthesia    Very emotional and cries after anesthesia   GERD (gastroesophageal reflux disease)    High blood pressure 03/25/2021   Kidney stone    Liver lesion    Microhematuria    Stroke [facility])     PSH:  Past Surgical History:  Procedure Laterality Date  ABDOMINAL HYSTERECTOMY     APPENDECTOMY     BRONCHIAL BIOPSY  07/18/2020   Procedure: BRONCHIAL BIOPSIES;  Surgeon: [name], DO;  Location: [place] [facility];  Service: Pulmonary;;   BRONCHIAL BRUSHINGS  07/18/2020   Procedure: BRONCHIAL BRUSHINGS;  Surgeon: [name], DO;  Location: [place];  Service: Pulmonary;;   BRONCHIAL NEEDLE ASPIRATION BIOPSY  07/18/2020   Procedure: BRONCHIAL NEEDLE ASPIRATION BIOPSIES;  Surgeon:  [name], DO;  Location: [facility];  Service: Pulmonary;;   BRONCHIAL WASHINGS  07/18/2020   Procedure: BRONCHIAL WASHINGS;  Surgeon: [name], DO;  Location: [facility];  Service: Pulmonary;;   FIDUCIAL MARKER PLACEMENT  07/18/2020   Procedure: FIDUCIAL MARKER PLACEMENT;  Surgeon: [name], DO;  Location: [place] [facility];  Service: Pulmonary;;   INTERCOSTAL NERVE BLOCK Right 08/26/2020   Procedure: INTERCOSTAL NERVE BLOCK;  Surgeon: [name], MD;  Location: [place];  Service: Thoracic;  Laterality: Right;   IR THORACENTESIS ASP PLEURAL SPACE W/IMG GUIDE  10/10/2020   NODE DISSECTION Right 08/26/2020   Procedure: NODE DISSECTION;  Surgeon: [name], MD;  Location: [place];  Service: Thoracic;  Laterality: Right;   THORACENTESIS Right 11/04/2020   Procedure: [facility];  Surgeon: [name], DO;  Location: [place];  Service: Pulmonary;  Laterality: Right;   THORACENTESIS Right 11/29/2020   Procedure: THORACENTESIS;  Surgeon: [name], DO;  Location: [facility] END[facility]COPY;  Service: Pulmonary;  Laterality: Right;   VIDEO BRONCHOSCOPY WITH ENDOBRONCHIAL NAVIGATION N/A 07/18/2020   Procedure: VIDEO BRONCHOSCOPY WITH ENDOBRONCHIAL NAVIGATION;  Surgeon: [name], DO;  Location: [place];  Service: Pulmonary;  Laterality: N/A;   VIDEO BRONCHOSCOPY WITH ENDOBRONCHIAL ULTRASOUND  07/18/2020   Procedure: VIDEO BRONCHOSCOPY WITH ENDOBRONCHIAL ULTRASOUND;  Surgeon: [name], DO;  Location: [facility];  Service: Pulmonary;;    Social History:  Social History   Socioeconomic History   Marital status: Married    Spouse name: Not on file   Number of children: 3   Years of education: Not on file   Highest education level: Not on file  Occupational History   Not on file  Tobacco Use   Smoking status: Former    Packs/day: 0.30    Years: 60.00    Total pack years: 18.00    Types: Cigarettes    Quit date: 06/11/2020    Years  since quitting: 1.7   Smokeless tobacco: Never  Vaping Use   Vaping Use: Never used  Substance and Sexual Activity   Alcohol use: Yes    Comment: 0.5 drink per month   Drug use: No   Sexual activity: Not Currently    Birth control/protection: Surgical    Comment: Hysterectomy  Other Topics Concern   Not on file  Social History Narrative   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    Social Determinants of Health   Financial Resource Strain: Not on file  Food Insecurity: Not on file  Transportation Needs: Not on file  Physical Activity: Not on file  Stress: Not on file  Social Connections: Not on file  Intimate Partner Violence: Not on file    Family History:  Family History  Problem Relation Age of Onset   Arrhythmia Mother        s/p Pacer    Heart attack Father    Heart disease Sister    Colon cancer Neg Hx     Medications:   Current Outpatient Medications on File Prior to  Visit  Medication Sig Dispense Refill   acetaminophen (TYLENOL) 325 MG tablet Take 1-2 tablets (325-650 mg total) by mouth every 4 (four) hours as needed for mild pain.     albuterol (VENTOLIN HFA) 108 (90 Base) MCG/ACT inhaler Inhale 2 puffs into the lungs every 6 (six) hours as needed for wheezing or shortness of breath. 8 g 2   alendronate (FOSAMAX) 70 MG tablet Take 70 mg by mouth every Saturday.     ALPRAZolam (XANAX) 1 MG tablet Take 1 mg by mouth at bedtime as needed for sleep.     Calcium Carbonate-Vitamin D (CALCIUM + D PO) Take 1 tablet by mouth 2 (two) times daily.     folic acid (FOLVITE) 1 MG tablet TAKE 1 TABLET(1 MG) BY MOUTH DAILY 30 tablet 4   gabapentin (NEURONTIN) 300 MG capsule TAKE 1 CAPSULE(300 MG) BY MOUTH THREE TIMES DAILY 90 capsule 0   lidocaine (LIDODERM) 5 % Place 3 patches onto the skin daily. Apply at 8 am and remove at 8 pm daily. (Patient taking differently: Place 3 patches onto the skin as needed. Apply at 8 am and remove at 8 pm daily.) 90 patch 0   Menthol-Methyl Salicylate  (MUSCLE RUB) 10-15 % CREA Apply 1 application topically 2 (two) times daily. (Patient taking differently: Apply 1 application  topically daily as needed for muscle pain.)  0   NARCAN 4 MG/0.1ML LIQD nasal spray kit 1 spray once.     omeprazole (PRILOSEC) 40 MG capsule Take 40 mg by mouth daily.     rosuvastatin (CRESTOR) 10 MG tablet Take 10 mg by mouth at bedtime.     umeclidinium-vilanterol (ANORO ELLIPTA) 62.5-25 MCG/INH AEPB Inhale 1 puff into the lungs daily. 60 each 0   No current facility-administered medications on file prior to visit.    Allergies:   Allergies  Allergen Reactions   Other Anaphylaxis    Spiders   Penicillins Hives    REACTION: 20 years   Tizanidine     Significant hypotension      OBJECTIVE:  Physical Exam  Vitals:   03/23/22 1031  BP: 121/72  Pulse: (!) 57  Weight: 170 lb (77.1 kg)  Height: _0  (1.626 m)   Body mass index is 29.18 kg/m. No results found.  General: well developed, well nourished, very pleasant elderly African-American female, seated, in no evident distress Head: head normocephalic and atraumatic.   Neck: supple with no carotid or supraclavicular bruits Cardiovascular: regular rate and rhythm, no murmurs Musculoskeletal: no deformity Skin:  no rash/petichiae Vascular:  Normal pulses all extremities   Neurologic Exam Mental Status: Awake and fully alert. Fluent speech and language.  Oriented to place and time. Recent memory impaired and remote memory intact. Attention span, concentration and fund of knowledge appropriate during visit. Mood and affect appropriate.  Cranial Nerves: Pupils equal, briskly reactive to light. Extraocular movements full without nystagmus. Visual fields full to confrontation. Hearing intact. Facial sensation intact. Face, tongue, palate moves normally and symmetrically.  Motor: Normal bulk and tone. Normal strength in all tested extremity muscles Sensory.: intact to touch , pinprick , position and  vibratory sensation.  Coordination: Rapid alternating movements normal in all extremities. Finger-to-nose and heel-to-shin performed accurately bilaterally. Gait and Station: Arises from chair without difficulty. Stance is normal. Gait demonstrates normal stride and balance without assistive device.  Tandem walk and heel toe with mild difficulty.  Reflexes: 1+ and symmetric. Toes downgoing.  ASSESSMENT: RICHELE STRAND is a 76 y.o. year old female with right parietal IPH with small frontal subarachnoid hemorrhage of indeterminate etiology on 02/22/2021 after presenting with AMS and recent stroke like episode possibly in setting of recrudescence of prior stroke symptoms vs complicated migraine with worsening balance, OS visual impairment and HA on 08/15/2021. Vascular risk factors include bradycardia likely in setting of ventricular bigeminy, stage IIB non-small cell lung cancer and adenocarcinoma RUL, and new dx of HTN.      PLAN:  Right IPH with SAH:  Stroke like episode Residual deficit: Short-term memory loss and visual spatial impairment - stable since prior visit. Discussed memory compensation strategies as well as importance of routine memory exercises, ensuring healthy diet, routine exercise and good sleep. Advised to discuss sleep concerns with PCP, may consider use of different sleep aid such as trazodone.  Plans to f/u next month with PCP with labs - recommend checking vit B12, thyroid and Vit D for reversible causes of memory concerns although suspect in setting of prior stroke.   Continue Crestor for secondary stroke prevention.  No indication for aspirin as no ischemic stroke history or cardiac/vascular stenting Discussed secondary stroke prevention measures and importance of close PCP follow up for aggressive stroke risk factor management including BP goal<130/90 and HLD with LDL goal<70  I have gone over the pathophysiology of stroke, warning signs and symptoms,  risk factors and their management in some detail with instructions to go to the closest emergency room for symptoms of concern.    Doing well from stroke standpoint without further recommendations and risk factors are managed by PCP. She may follow up PRN, as usual for our patients who are strictly being followed for stroke. If any new neurological issues should arise, request PCP place referral for evaluation by one of our neurologists. Thank you.     CC:  PCP: Lin Landsman, MD    I spent 34 minutes of face-to-face and non-face-to-face time with patient.  This included previsit chart review including review of recent hospitalization, lab review, study review, electronic health record documentation, patient education regarding prior stroke including potential etiology and residual deficits, secondary stroke prevention measures and importance of managing stroke risk factors, and answered all other questions to patients satisfaction  Frann Rider, AGNP-BC  [facility] [address] [place], [place] [address]  Phone [phone] Fax [phone] Note: This document was prepared with digital dictation and possible smart phrase technology. Any transcriptional errors that result from this process are unintentional.

## 2022-03-23 NOTE — Patient Instructions (Addendum)
Ensure you are routinely doing memory exercises such as crossword puzzles, word search and sodoku as well as ensuring adequate management of stroke risk factors, eating a healthy diet, routine exercise and good sleep hygiene. Important to work on compensation strategies as well.   At next visit with PCP, please discuss checking B12, thyroid levels and Vit D if not recently checked  Please discuss sleep concerns with your PCP - can consider use of trazodone as this can last longer to help you sleep for longer periods of time.   Continue Crestor for secondary stroke prevention  Continue to follow up with PCP regarding cholesterol and blood pressure management  Maintain strict control of hypertension with blood pressure goal below 130/90 and cholesterol with LDL cholesterol (bad cholesterol) goal below 70 mg/dL.   Signs of a Stroke? Follow the BEFAST method:  Balance Watch for a sudden loss of balance, trouble with coordination or vertigo Eyes Is there a sudden loss of vision in one or both eyes? Or double vision?  Face: Ask the person to smile. Does one side of the face droop or is it numb?  Arms: Ask the person to raise both arms. Does one arm drift downward? Is there weakness or numbness of a leg? Speech: Ask the person to repeat a simple phrase. Does the speech sound slurred/strange? Is the person confused ? Time: If you observe any of these signs, call 911.        Thank you for coming to see [place] at [facility]. I hope we have been able to provide you high quality care today.  You may receive a patient satisfaction survey over the next few weeks. We would appreciate your feedback and comments so that we may continue to improve ourselves and the health of our patients.    Memory Compensation Strategies  Use "WARM" strategy.  W= write it down  A= associate it  R= repeat it  M= make a mental note  2.   You can keep a Social worker.  Use a 3-ring notebook with  sections for the following: calendar, important names and phone numbers,  medications, doctors' names/phone numbers, lists/reminders, and a section to journal what you did  each day.   3.    Use a calendar to write appointments down.  4.    Write yourself a schedule for the day.  This can be placed on the calendar or in a separate section of the Memory Notebook.  Keeping a  regular schedule can help memory.  5.    Use medication organizer with sections for each day or morning/evening pills.  You may need help loading it  6.    Keep a basket, or pegboard by the door.  Place items that you need to take out with you in the basket or on the pegboard.  You may also want to  include a message board for reminders.  7.    Use sticky notes.  Place sticky notes with reminders in a place where the task is performed.  For example: " turn off the  stove" placed by the stove, "lock the door" placed on the door at eye level, " take your medications" on  the bathroom mirror or by the place where you normally take your medications.  8.    Use alarms/timers.  Use while cooking to remind yourself to check on food or as a reminder to take your medicine, or as a  reminder to make a call, or  as a reminder to perform another task, etc.

## 2022-03-30 IMAGING — CT CT ANGIO HEAD-NECK (W OR W/O PERF)
2 of 11 series · 6 of 35 positions shown · IV contrast (OMNI 350)
Comparison: CT head 08/15/2021

CLINICAL DATA: Acute neuro deficit. Left eye vision change.
Difficulty walking. History of stroke

EXAM:
CT ANGIOGRAPHY HEAD AND NECK
TECHNIQUE: Multidetector CT imaging of the head and neck was performed using
the standard protocol during bolus administration of intravenous
contrast. Multiplanar CT image reconstructions and MIPs were
obtained to evaluate the vascular anatomy. Carotid stenosis
measurements (when applicable) are obtained utilizing NASCET
criteria, using the distal internal carotid diameter as the
denominator.
CONTRAST:  75mL OMNIPAQUE IOHEXOL 350 MG/ML SOLN

[Series 11: cta neck axial · axial · 0.39mm/px · z∈[+995,+1221]mm · 5 of 341 slices shown]
[im 57/341  soft-tissue]
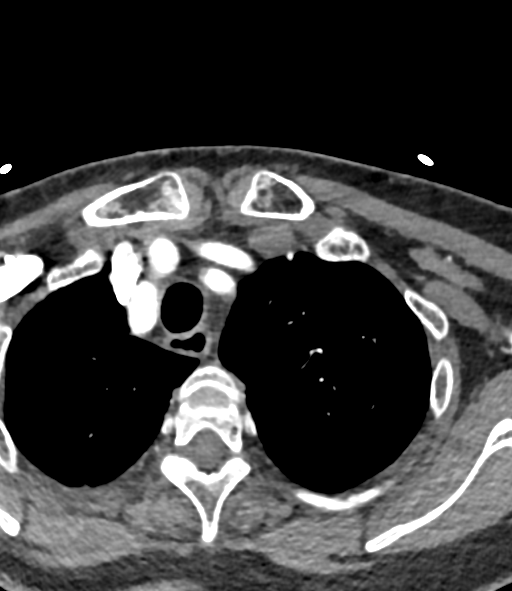
[im 114/341  bone]
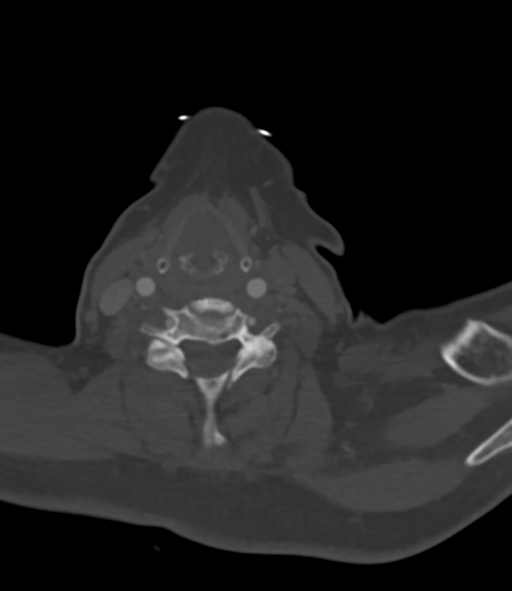
[im 171/341  soft-tissue]
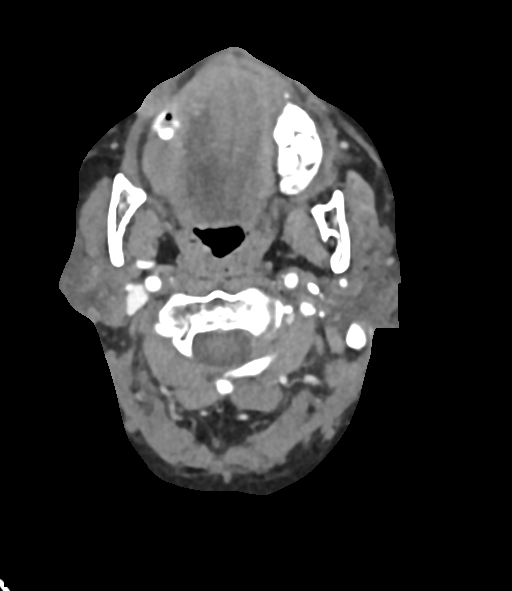
[im 227/341  bone]
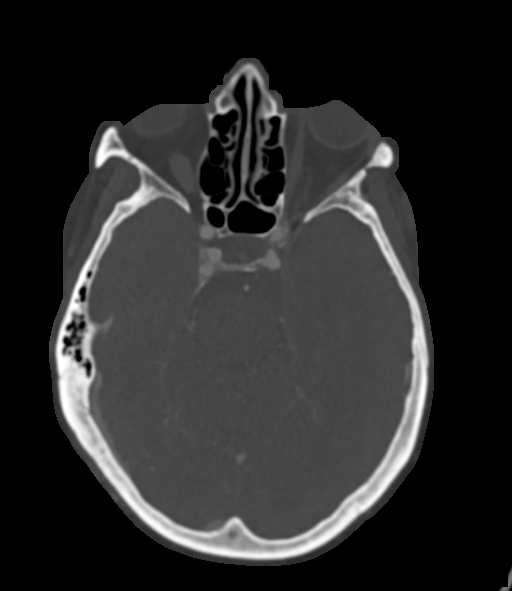
[im 284/341  soft-tissue]
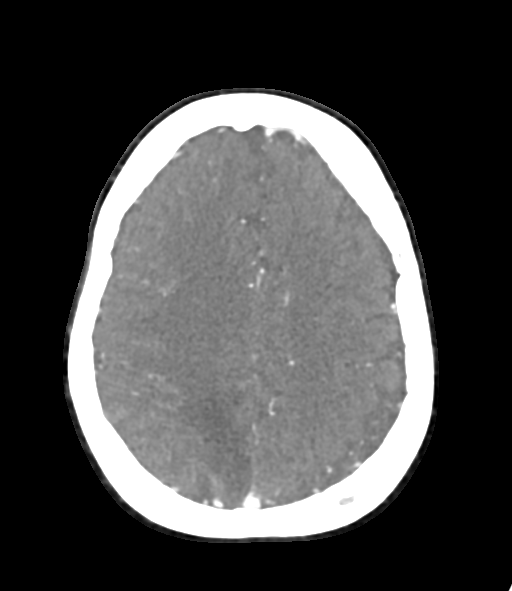

[Series 13: cta neck sagittal · sagittal · 0.45mm/px · 1 of 201 slices shown]
[im 71/201  soft-tissue]
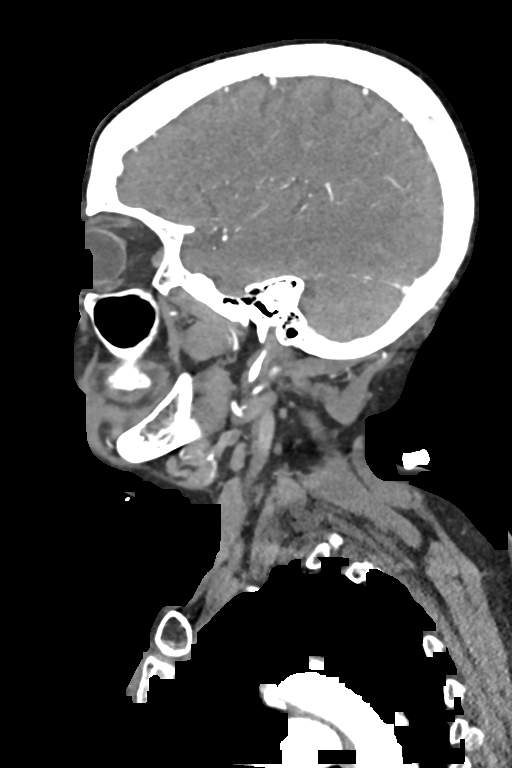

[6 of 35 positions shown; findings below may reference images not displayed]

FINDINGS: CT HEAD FINDINGS

Brain: Chronic infarct in the right medial parietal lobe unchanged.
No superimposed acute infarct, hemorrhage, mass. Ventricle size
normal.

Vascular: Negative for hyperdense vessel

Skull: Negative

Sinuses: Negative

Orbits: Negative

Review of the MIP images confirms the above findings

CTA NECK FINDINGS

Aortic arch: Standard branching. Imaged portion shows no evidence of
aneurysm or dissection. No significant stenosis of the major arch
vessel origins. Mild atherosclerotic disease in the aortic arch.

Right carotid system: Right carotid widely patent. Negative for
atherosclerotic disease or dissection. Tortuosity of the right
internal carotid artery.

Left carotid system: Mild atherosclerotic disease left carotid
bifurcation. Negative for stenosis. Tortuosity of the left internal
carotid artery.

Vertebral arteries: Both vertebral arteries patent to the basilar.

Skeleton: No acute skeletal abnormality.  Multiple dental caries.

Other neck: Negative for soft tissue mass or adenopathy. Mild
thyromegaly without significant nodularity.

Upper chest: Mild apical emphysema. 8 mm spiculated nodule left
upper lobe unchanged from CT chest 02/04/2021.

Review of the MIP images confirms the above findings

CTA HEAD FINDINGS

Anterior circulation: Cavernous carotid widely patent bilaterally.
Anterior and middle cerebral arteries are widely patent bilaterally.
Negative for large vessel occlusion.

Posterior circulation: Both vertebral arteries patent to the
basilar. PICA patent bilaterally. Basilar widely patent. Posterior
cerebral arteries widely patent bilaterally. Fetal origin of the
right posterior cerebral artery.

Venous sinuses: Normal venous enhancement

Anatomic variants: None

Review of the MIP images confirms the above findings
IMPRESSION: 1. Negative for intracranial stenosis or large vessel occlusion
2. No significant carotid or vertebral artery stenosis in the neck.
3. CT head demonstrates no acute abnormality. Chronic infarct right
parietal lobe
4. 8 mm spiculated nodule left upper lobe, unchanged from prior
chest CT 02/04/2021

## 2022-03-30 IMAGING — MR MR HEAD W/O CM
9 of 10 series · 38 of 48 positions shown · non-contrast
Comparison: Non-contrast head CT and CT angiogram head/neck
performed earlier today 08/15/2021. Brain MRI 07/04/2021.

CLINICAL DATA: Neuro deficit, acute, stroke suspected. Additional
history provided: Left eye vision changes, difficulty
walking/grasping. History of hemorrhagic stroke in [REDACTED] with
left-sided weakness from this prior event. Weakness has become
worse.

EXAM:
MRI HEAD WITHOUT CONTRAST
TECHNIQUE: Multiplanar, multiecho pulse sequences of the brain and surrounding
structures were obtained without intravenous contrast.

[Series 3: DWI · axial · 3.0mm · 1.09mm/px · z∈[-97,+55]mm · 11 of 104 slices shown (1 of 4)]
[im 1/104]
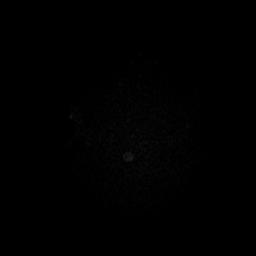
[im 11/104]
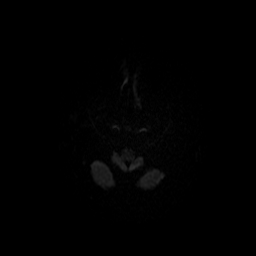
[im 21/104]
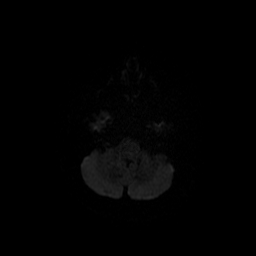
[im 31/104]
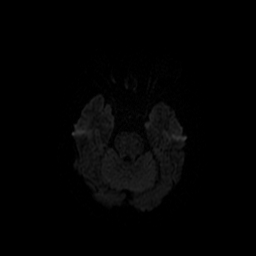
[im 42/104]
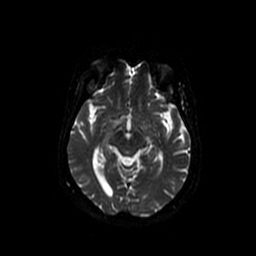
[im 52/104]
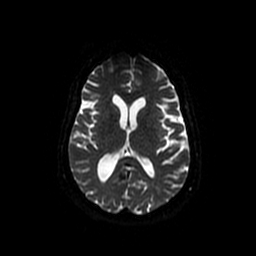
[im 62/104]
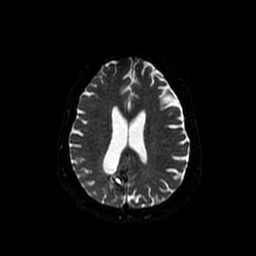
[im 73/104]
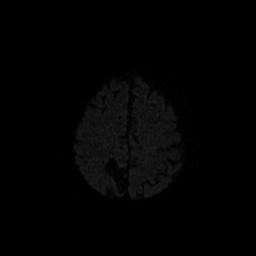
[im 83/104]
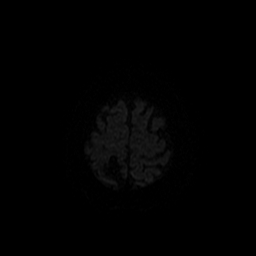
[im 93/104]
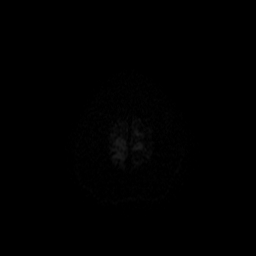
[im 104/104]
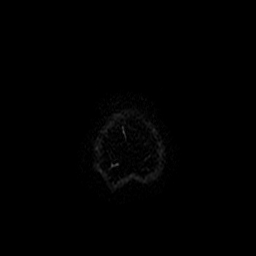

[Series 4: DWI · coronal · 5.0mm · 1.09mm/px · 7 of 76 slices shown (2 of 4)]
[im 1/76]
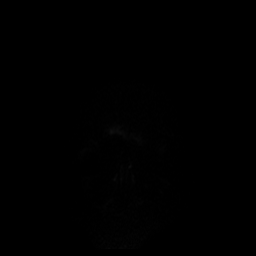
[im 13/76]
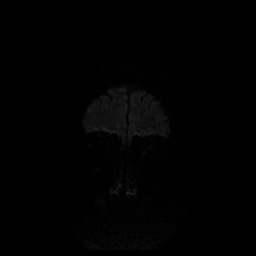
[im 26/76]
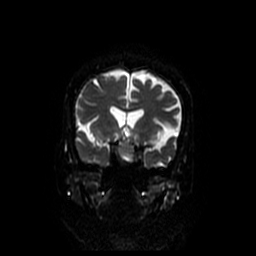
[im 38/76]
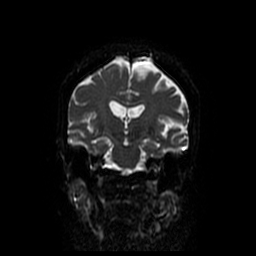
[im 51/76]
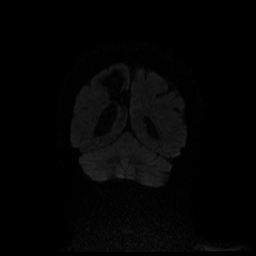
[im 63/76]
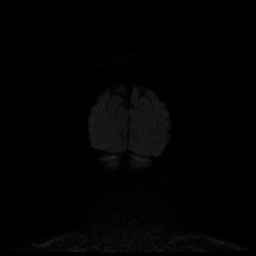
[im 76/76]
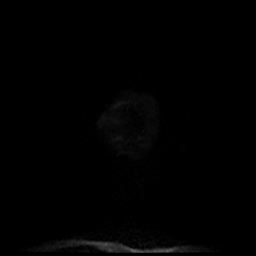

[Series 5: T1 · sagittal · 5.0mm · 0.47mm/px · 2 of 25 slices shown (1 of 2)]
[im 1/25]
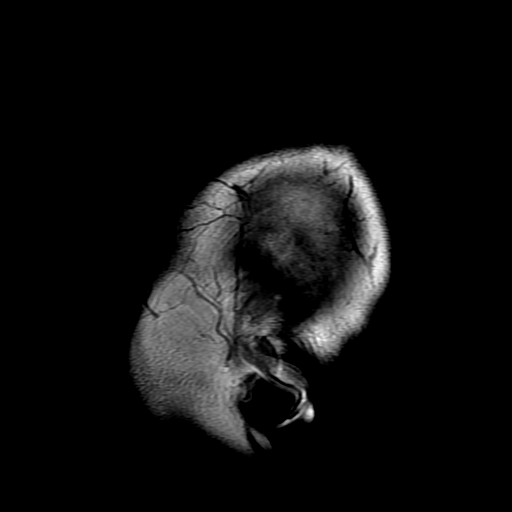
[im 25/25]
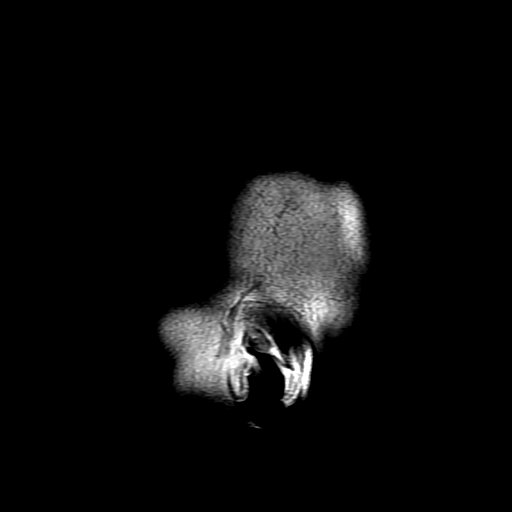

[Series 6: T2 · axial · 5.0mm · 0.43mm/px · z∈[-95,+54]mm · 2 of 26 slices shown (1 of 2)]
[im 1/26]
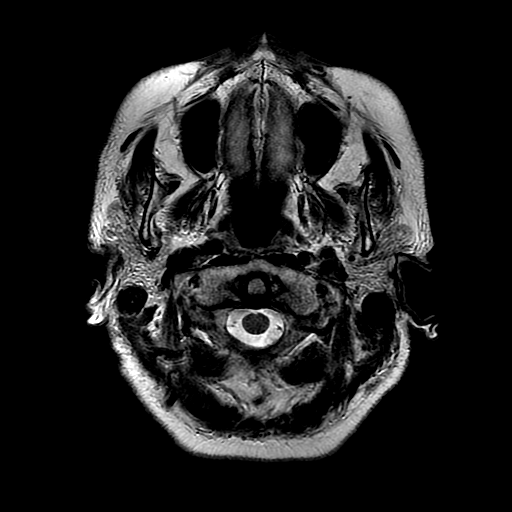
[im 26/26]
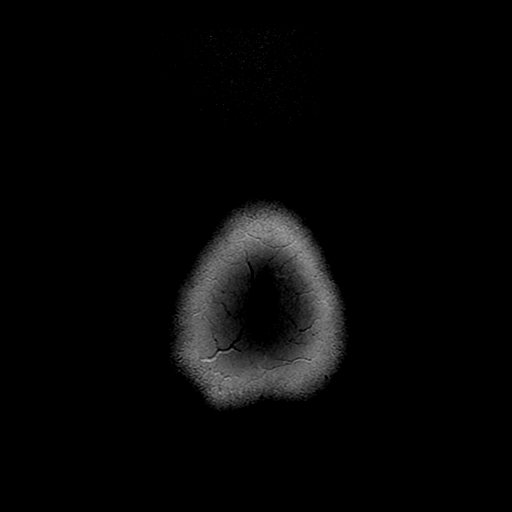

[Series 7: FLAIR · axial · 3.0mm · 0.43mm/px · z∈[-95,+54]mm · 2 of 26 slices shown]
[im 1/26]
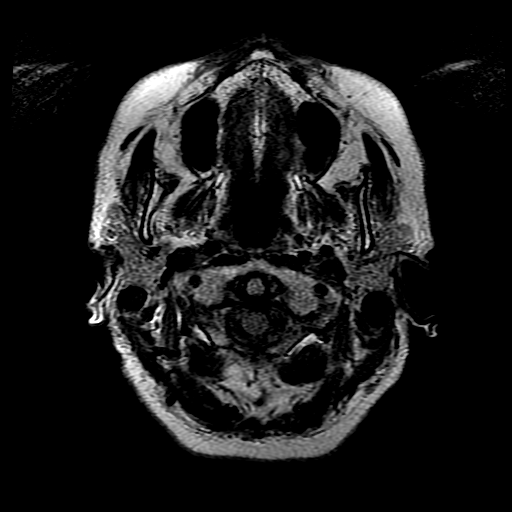
[im 26/26]
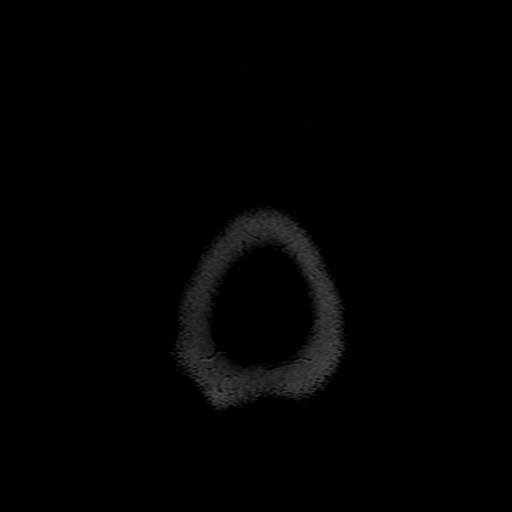

[Series 9: T1 · axial · 3.0mm · 0.47mm/px · z∈[-97,-80]mm · 2 of 104 slices shown (2 of 2)]
[im 1/104]
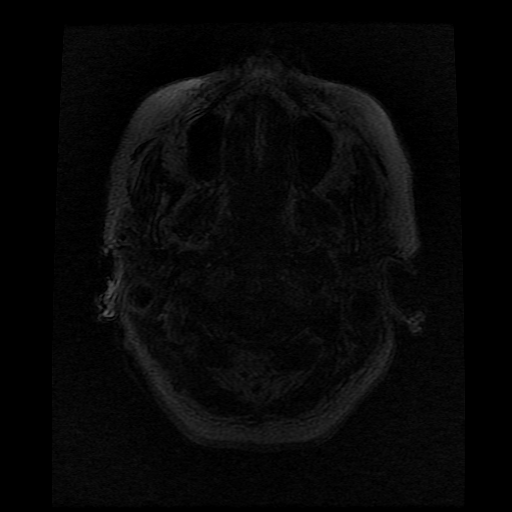
[im 12/104]
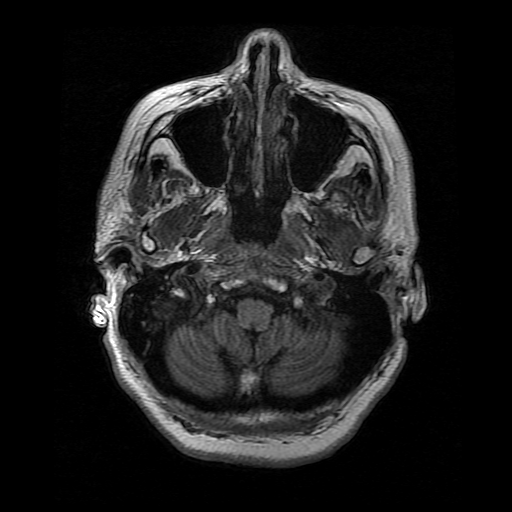

[Series 10: T2 · coronal · 5.0mm · 0.39mm/px · 3 of 29 slices shown (2 of 2)]
[im 1/29]
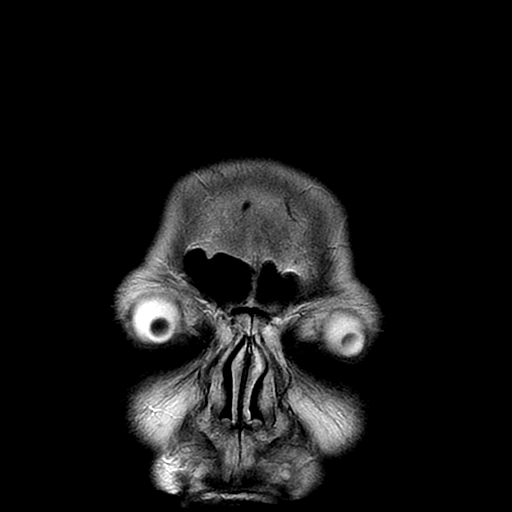
[im 15/29]
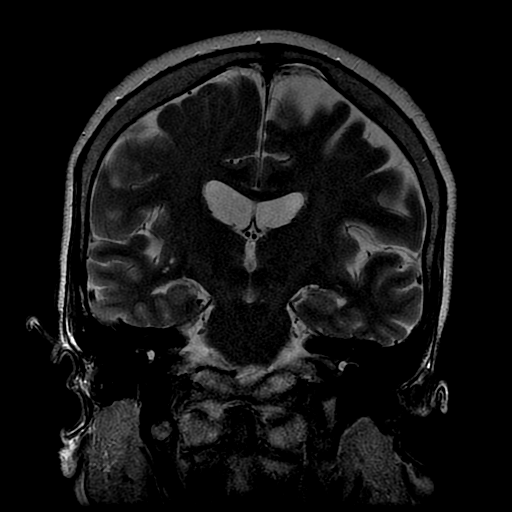
[im 29/29]
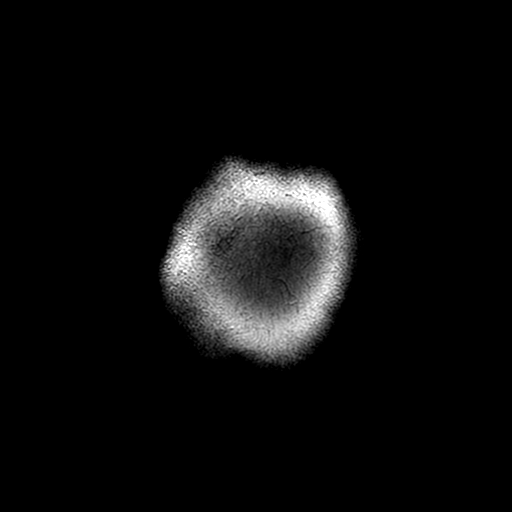

[Series 300: DWI · axial · 3.0mm · 1.09mm/px · z∈[-97,+55]mm · 5 of 52 slices shown (3 of 4)]
[im 1/52]
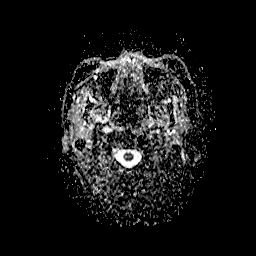
[im 13/52]
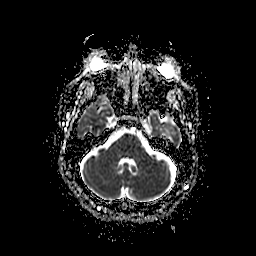
[im 26/52]
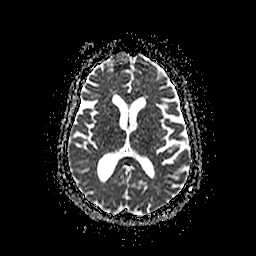
[im 39/52]
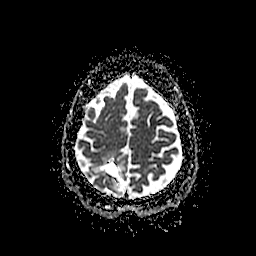
[im 52/52]
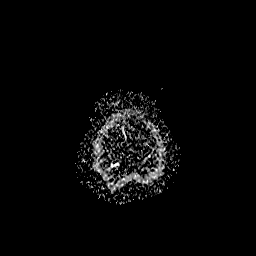

[Series 400: DWI · coronal · 5.0mm · 1.09mm/px · 4 of 38 slices shown (4 of 4)]
[im 1/38]
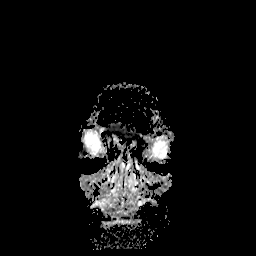
[im 13/38]
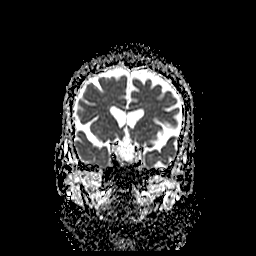
[im 25/38]
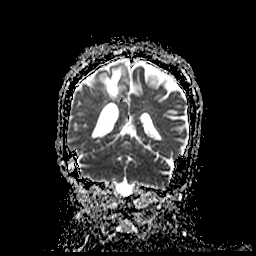
[im 38/38]
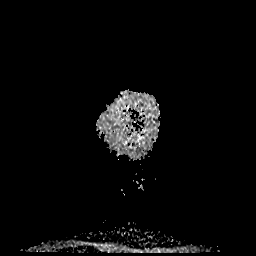

[38 of 48 positions shown; findings below may reference images not displayed]

FINDINGS: Brain:

Redemonstrated chronic cortical/subcortical infarct within the right
parietal lobe with associated chronic hemosiderin deposition. Subtle
ex vacuo dilatation of the right lateral ventricle.

Background cerebral volume is normal for age.

There is no acute infarct.

No evidence of an intracranial mass.

No chronic intracranial blood products.

No extra-axial fluid collection.

No midline shift.

Vascular: Maintained flow voids within the proximal large arterial
vessels.

Skull and upper cervical spine: No focal suspicious marrow lesion.

Sinuses/Orbits: Visualized orbits show no acute finding. No
significant paranasal sinus disease.

Other: Right mastoid effusion.
IMPRESSION: No evidence of acute intracranial abnormality.

Redemonstrated chronic cortical/subcortical infarct within the right
parietal lobe, with associated chronic blood products.

Otherwise unremarkable non-contrast MRI appearance of the brain.

Right mastoid effusion.

## 2022-04-18 ENCOUNTER — Other Ambulatory Visit: Payer: Self-pay | Admitting: Physical Medicine & Rehabilitation

## 2022-06-18 ENCOUNTER — Other Ambulatory Visit: Payer: Self-pay | Admitting: Physical Medicine & Rehabilitation

## 2022-08-17 ENCOUNTER — Inpatient Hospital Stay: Payer: Medicare HMO | Attending: Internal Medicine

## 2022-08-17 ENCOUNTER — Other Ambulatory Visit: Payer: Self-pay

## 2022-08-17 ENCOUNTER — Ambulatory Visit (HOSPITAL_COMMUNITY)
Admission: RE | Admit: 2022-08-17 | Discharge: 2022-08-17 | Disposition: A | Discharge status: Home / Self Care | Payer: Medicare HMO | Source: Ambulatory Visit | Attending: Internal Medicine | Admitting: Internal Medicine

## 2022-08-17 DIAGNOSIS — C3490 Malignant neoplasm of unspecified part of unspecified bronchus or lung: Secondary | ICD-10-CM | POA: Diagnosis not present

## 2022-08-17 DIAGNOSIS — I517 Cardiomegaly: Secondary | ICD-10-CM | POA: Insufficient documentation

## 2022-08-17 DIAGNOSIS — Z85118 Personal history of other malignant neoplasm of bronchus and lung: Secondary | ICD-10-CM | POA: Insufficient documentation

## 2022-08-17 DIAGNOSIS — R0789 Other chest pain: Secondary | ICD-10-CM | POA: Insufficient documentation

## 2022-08-17 DIAGNOSIS — Z08 Encounter for follow-up examination after completed treatment for malignant neoplasm: Secondary | ICD-10-CM | POA: Insufficient documentation

## 2022-08-17 LAB — CBC WITH DIFFERENTIAL (CANCER CENTER ONLY)
Abs Immature Granulocytes: 0.01 10*3/uL (ref 0.00–0.07)
Basophils Absolute: 0 10*3/uL (ref 0.0–0.1)
Basophils Relative: 1 %
Eosinophils Absolute: 0.8 10*3/uL — ABNORMAL HIGH (ref 0.0–0.5)
Eosinophils Relative: 13 %
HCT: 34 % — ABNORMAL LOW (ref 36.0–46.0)
Hemoglobin: 11.4 g/dL — ABNORMAL LOW (ref 12.0–15.0)
Immature Granulocytes: 0 %
Lymphocytes Relative: 23 %
Lymphs Abs: 1.4 10*3/uL (ref 0.7–4.0)
MCH: 31.5 pg (ref 26.0–34.0)
MCHC: 33.5 g/dL (ref 30.0–36.0)
MCV: 93.9 fL (ref 80.0–100.0)
Monocytes Absolute: 0.6 10*3/uL (ref 0.1–1.0)
Monocytes Relative: 10 %
Neutro Abs: 3.2 10*3/uL (ref 1.7–7.7)
Neutrophils Relative %: 53 %
Platelet Count: 252 10*3/uL (ref 150–400)
RBC: 3.62 MIL/uL — ABNORMAL LOW (ref 3.87–5.11)
RDW: 12.2 % (ref 11.5–15.5)
WBC Count: 6 10*3/uL (ref 4.0–10.5)
nRBC: 0 % (ref 0.0–0.2)

## 2022-08-17 LAB — CMP (CANCER CENTER ONLY)
ALT: 10 U/L (ref 0–44)
AST: 13 U/L — ABNORMAL LOW (ref 15–41)
Albumin: 4.1 g/dL (ref 3.5–5.0)
Alkaline Phosphatase: 83 U/L (ref 38–126)
Anion gap: 8 (ref 5–15)
BUN: 11 mg/dL (ref 8–23)
CO2: 24 mmol/L (ref 22–32)
Calcium: 10.1 mg/dL (ref 8.9–10.3)
Chloride: 108 mmol/L (ref 98–111)
Creatinine: 0.95 mg/dL (ref 0.44–1.00)
GFR, Estimated: >60 mL/min (ref >60)
Glucose, Bld: 87 mg/dL (ref 70–99)
Potassium: 4.1 mmol/L (ref 3.5–5.1)
Sodium: 140 mmol/L (ref 135–145)
Total Bilirubin: 0.5 mg/dL (ref 0.3–1.2)
Total Protein: 7.1 g/dL (ref 6.5–8.1)

## 2022-08-17 MED ORDER — SODIUM CHLORIDE (PF) 0.9 % IJ SOLN
INTRAMUSCULAR | Status: AC
  Filled 2022-08-17: qty 50

## 2022-08-17 MED ORDER — IOHEXOL 300 MG/ML  SOLN
75.0000 mL | Freq: Once | INTRAMUSCULAR | Status: AC | PRN
  Administered 2022-08-17: 75 mL via INTRAVENOUS

## 2022-08-18 ENCOUNTER — Other Ambulatory Visit: Payer: Self-pay | Admitting: Physical Medicine & Rehabilitation

## 2022-08-18 ENCOUNTER — Other Ambulatory Visit: Payer: Self-pay | Admitting: Thoracic Surgery (Cardiothoracic Vascular Surgery)

## 2022-08-20 ENCOUNTER — Inpatient Hospital Stay: Payer: Medicare HMO | Admitting: Internal Medicine

## 2022-08-20 ENCOUNTER — Other Ambulatory Visit: Payer: Self-pay

## 2022-08-20 VITALS — BP 128/69 | HR 63 | Temp 97.8°F | Resp 16 | Ht 64.0 in | Wt 164.5 lb

## 2022-08-20 DIAGNOSIS — Z85118 Personal history of other malignant neoplasm of bronchus and lung: Secondary | ICD-10-CM | POA: Diagnosis present

## 2022-08-20 DIAGNOSIS — Z08 Encounter for follow-up examination after completed treatment for malignant neoplasm: Secondary | ICD-10-CM | POA: Diagnosis present

## 2022-08-20 DIAGNOSIS — C3490 Malignant neoplasm of unspecified part of unspecified bronchus or lung: Secondary | ICD-10-CM | POA: Diagnosis not present

## 2022-08-20 DIAGNOSIS — R0789 Other chest pain: Secondary | ICD-10-CM | POA: Diagnosis not present

## 2022-08-20 DIAGNOSIS — I517 Cardiomegaly: Secondary | ICD-10-CM | POA: Diagnosis not present

## 2022-08-20 NOTE — Progress Notes (Signed)
[facility] [phone]   Fax:([phone]) [place], [place] [address]  DIAGNOSIS: Stage IIB (T3, N0, M0) non-small cell lung cancer, adenocarcinoma involving the right upper lobe. The patient also has evidence for adenocarcinoma of the left upper lobe lung nodule as well as subcentimeter pulmonary nodules in the right lung concerning for stage IV lung cancer with multifocal disease bilaterally.  This was diagnosed in December  2021  Biomarker Findings  Microsatellite status - MS-Equivocal ? Tumor Mutational Burden - Cannot Be Determined Genomic Findings For a complete list of the genes assayed, please refer to the Appendix. MET [id number] AXL T343M TP53 I254V 7 Disease relevant genes with no reportable alterations: ALK, BRAF, EGFR, ERBB2, KRAS, RET, ROS1  PDL1 Expression 20 %.  PRIOR THERAPY:  1) status post right upper lobectomy with lymph node sampling on August 26, 2020 under the care of Dr. LKipp Brood 2) Adjuvant systemic chemotherapy with cisplatin 75 mg/M2 and Alimta 550/M2 every 3 weeks. Last dose 01/17/21.  Status post 4 cycles   CURRENT THERAPY: Observation   INTERVAL HISTORY: Tonya LEMBERGER76y.o. female returns to the clinic today for follow-up visit accompanied by her husband.  The patient is feeling fine today with no concerning complaints except for pain on the right side of the chest as well as right upper quadrant of the abdomen.  She has been using Tylenol and lidocaine patch with some improvement.  She denied having any shortness of breath, cough or hemoptysis.  She has no nausea, vomiting, diarrhea or constipation.  She has no headache or visual changes.  She denied having any fever or chills.  She is here today for evaluation with repeat CT scan of the chest for restaging of her disease.   MEDICAL HISTORY: Past Medical History:  Diagnosis Date   Adenoma of left  adrenal gland    [place]   Colon polyps    hyperplastic   Complication of anesthesia    Very emotional and cries after anesthesia   GERD (gastroesophageal reflux disease)    High blood pressure 03/25/2021   Kidney stone    Liver lesion    Microhematuria    Stroke ([facility])     ALLERGIES:  is allergic to other, penicillins, and tizanidine.  MEDICATIONS:  Current Outpatient Medications  Medication Sig Dispense Refill   acetaminophen (TYLENOL) 325 MG tablet Take 1-2 tablets (325-650 mg total) by mouth every 4 (four) hours as needed for mild pain.     albuterol (VENTOLIN HFA) 108 (90 Base) MCG/ACT inhaler Inhale 2 puffs into the lungs every 6 (six) hours as needed for wheezing or shortness of breath. 8 g 2   alendronate (FOSAMAX) 70 MG tablet Take 70 mg by mouth every Saturday.     ALPRAZolam (XANAX) 1 MG tablet Take 1 mg by mouth at bedtime as needed for sleep.     Calcium Carbonate-Vitamin D (CALCIUM + D PO) Take 1 tablet by mouth 2 (two) times daily.     folic acid (FOLVITE) 1 MG tablet TAKE 1 TABLET(1 MG) BY MOUTH DAILY 30 tablet 4   gabapentin (NEURONTIN) 100 MG capsule Take 1 capsule (100 mg total) by mouth 3 (three) times daily. 90 capsule 3   HYDROcodone-acetaminophen (NORCO) 10-325 MG tablet Take 1 tablet by mouth every 6 (six) hours as needed for moderate pain.     lidocaine (LIDODERM) 5 %  Place 3 patches onto the skin daily. Apply at 8 am and remove at 8 pm daily. (Patient taking differently: Place 3 patches onto the skin as needed. Apply at 8 am and remove at 8 pm daily.) 90 patch 0   magnesium oxide (MAG-OX) 400 (241.3 Mg) MG tablet Take 1 tablet (400 mg total) by mouth 2 (two) times daily. (Patient not taking: Reported on 11/06/2021) 60 tablet 1   Menthol-Methyl Salicylate (MUSCLE RUB) 10-15 % CREA Apply 1 application topically 2 (two) times daily. (Patient taking differently: Apply 1 application topically daily as needed for muscle pain.)  0   NARCAN 4 MG/0.1ML LIQD  nasal spray kit 1 spray once.     omeprazole (PRILOSEC) 40 MG capsule Take 40 mg by mouth daily.     rosuvastatin (CRESTOR) 10 MG tablet Take 10 mg by mouth at bedtime.     umeclidinium-vilanterol (ANORO ELLIPTA) 62.5-25 MCG/INH AEPB Inhale 1 puff into the lungs daily. (Patient not taking: Reported on 11/06/2021) 60 each 0   No current facility-administered medications for this visit.    SURGICAL HISTORY:  Past Surgical History:  Procedure Laterality Date   ABDOMINAL HYSTERECTOMY     APPENDECTOMY     BRONCHIAL BIOPSY  07/18/2020   Procedure: BRONCHIAL BIOPSIES;  Surgeon: [name], DO;  Location: [facility];  Service: Pulmonary;;   BRONCHIAL BRUSHINGS  07/18/2020   Procedure: BRONCHIAL BRUSHINGS;  Surgeon: [name], DO;  Location: [facility];  Service: Pulmonary;;   BRONCHIAL NEEDLE ASPIRATION BIOPSY  07/18/2020   Procedure: BRONCHIAL NEEDLE ASPIRATION BIOPSIES;  Surgeon: [name], DO;  Location: [facility];  Service: Pulmonary;;   BRONCHIAL WASHINGS  07/18/2020   Procedure: BRONCHIAL WASHINGS;  Surgeon: [name], DO;  Location: [facility];  Service: Pulmonary;;   FIDUCIAL MARKER PLACEMENT  07/18/2020   Procedure: FIDUCIAL MARKER PLACEMENT;  Surgeon: [name], DO;  Location: [facility];  Service: Pulmonary;;   INTERCOSTAL NERVE BLOCK Right 08/26/2020   Procedure: INTERCOSTAL NERVE BLOCK;  Surgeon: [name], MD;  Location: [facility];  Service: Thoracic;  Laterality: Right;   IR THORACENTESIS ASP PLEURAL SPACE W/IMG GUIDE  10/10/2020   NODE DISSECTION Right 08/26/2020   Procedure: NODE DISSECTION;  Surgeon: [name], MD;  Location: [facility];  Service: Thoracic;  Laterality: Right;   THORACENTESIS Right 11/04/2020   Procedure: [facility];  Surgeon: [name], DO;  Location: [facility];  Service: Pulmonary;  Laterality: Right;   THORACENTESIS Right 11/29/2020   Procedure: THORACENTESIS;  Surgeon: [name], DO;   Location: [facility];  Service: Pulmonary;  Laterality: Right;   VIDEO BRONCHOSCOPY WITH ENDOBRONCHIAL NAVIGATION N/A 07/18/2020   Procedure: VIDEO BRONCHOSCOPY WITH ENDOBRONCHIAL NAVIGATION;  Surgeon: [name], DO;  Location: [place];  Service: Pulmonary;  Laterality: N/A;   VIDEO BRONCHOSCOPY WITH ENDOBRONCHIAL ULTRASOUND  07/18/2020   Procedure: VIDEO BRONCHOSCOPY WITH ENDOBRONCHIAL ULTRASOUND;  Surgeon: [name], DO;  Location: [facility];  Service: Pulmonary;;    REVIEW OF SYSTEMS:  Constitutional: negative Eyes: negative Ears, nose, mouth, throat, and face: negative Respiratory: positive for pleurisy/chest pain Cardiovascular: negative Gastrointestinal: negative Genitourinary:negative Integument/breast: negative Hematologic/lymphatic: negative Musculoskeletal:negative Neurological: negative Behavioral/Psych: negative Endocrine: negative Allergic/Immunologic: negative   PHYSICAL EXAMINATION: General appearance: alert, cooperative, and no distress Head: Normocephalic, without obvious abnormality, atraumatic Neck: no adenopathy, no JVD, supple, symmetrical, trachea midline, and thyroid not enlarged, symmetric, no tenderness/mass/nodules Lymph nodes: Cervical, supraclavicular, and axillary nodes normal. Resp: clear to auscultation  bilaterally Back: symmetric, no curvature. ROM normal. No CVA tenderness. Cardio: regular rate and rhythm, S1, S2 normal, no murmur, click, rub or gallop GI: soft, non-tender; bowel sounds normal; no masses,  no organomegaly Extremities: extremities normal, atraumatic, no cyanosis or edema Neurologic: Alert and oriented X 3, normal strength and tone. Normal symmetric reflexes. Normal coordination and gait  ECOG PERFORMANCE STATUS: 1 - Symptomatic but completely ambulatory  Blood pressure 126/75, pulse 73, temperature 98.7 F (37.1 C), temperature source Oral, resp. rate 16, weight 161 lb 9 oz (73.3 kg), SpO2 100  %.  LABORATORY DATA: Lab Results  Component Value Date   WBC 5.5 02/13/2022   HGB 11.3 (L) 02/13/2022   HCT 34.9 (L) 02/13/2022   MCV 98.3 02/13/2022   PLT 198 02/13/2022      Chemistry      Component Value Date/Time   NA 140 02/13/2022 0953   K 4.5 02/13/2022 0953   CL 109 02/13/2022 0953   CO2 26 02/13/2022 0953   BUN 19 02/13/2022 0953   CREATININE 1.21 (H) 02/13/2022 0953      Component Value Date/Time   CALCIUM 9.7 02/13/2022 0953   ALKPHOS 88 02/13/2022 0953   AST 16 02/13/2022 0953   ALT 11 02/13/2022 0953   BILITOT 0.2 (L) 02/13/2022 0953       RADIOGRAPHIC STUDIES: CT Chest W Contrast  Result Date: 02/16/2022 CLINICAL DATA:  76 year old female with history of non-small cell lung cancer. Follow-up evaluation. * Tracking Code: BO * EXAM: CT CHEST WITH CONTRAST TECHNIQUE: Multidetector CT imaging of the chest was performed during intravenous contrast administration. RADIATION DOSE REDUCTION: This exam was performed according to the departmental dose-optimization program which includes automated exposure control, adjustment of the mA and/or kV according to patient size and/or use of iterative reconstruction technique. CONTRAST:  50m OMNIPAQUE IOHEXOL 300 MG/ML  SOLN COMPARISON:  Multiple priors, most recently chest CT 08/07/2021. FINDINGS: Cardiovascular: Heart size is mildly enlarged. There is no significant pericardial fluid, thickening or pericardial calcification. Aortic atherosclerosis. No definite coronary artery calcifications. Mediastinum/Nodes: No pathologically enlarged mediastinal or hilar lymph nodes. Esophagus is unremarkable in appearance. No axillary lymphadenopathy. Lungs/Pleura: Status post right upper lobectomy. Compensatory hyperexpansion of the right middle and lower lobes. Suture line along the posterior aspect of the right middle lobe with adjacent chronic soft tissue thickening, stable compared to prior examinations, most compatible with areas of  chronic scarring. 8 mm spiculated nodule in the left upper lobe near the apex (axial image 25 of series 7), stable compared to the prior study. Adjacent fiducial marker in the left upper lobe noted. No other new suspicious appearing pulmonary nodules or masses are noted. No acute consolidative airspace disease. No pleural effusions. Upper Abdomen: Aortic atherosclerosis. Well-defined low-attenuation lesion in the left kidney measuring 3.7 cm in diameter, incompletely visualized and therefore not characterized, but statistically likely a simple cyst (no imaging follow-up is recommended). Persistent dilatation of the common bile duct measuring 1.3 cm in diameter, with severe intrahepatic biliary ductal dilatation. Musculoskeletal: There are no aggressive appearing lytic or blastic lesions noted in the visualized portions of the skeleton. IMPRESSION: 1. Stable findings, including chronic postoperative scarring adjacent to a suture line in the posterior aspect of the right middle lobe, and stable small aggressive appearing nodule in the apex of the left upper lobe adjacent to fiducial markers. No new suspicious appearing pulmonary nodules or masses are noted. 2. 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 Further evaluation with repeat abdominal MRI with and without IV gadolinium with MRCP should be considered if there is any clinical evidence for biliary tract obstruction. 3. Aortic atherosclerosis. 4. Mild cardiomegaly. Aortic Atherosclerosis (ICD10-I70.0). Electronically Signed   By: Vinnie Langton M.D.   On: 02/16/2022 08:29     ASSESSMENT AND PLAN: This is a very pleasant 76 years old African-American female recently diagnosed with a stage IIb (T3, N0, M0) non-small cell lung cancer, adenocarcinoma involving the right upper lobe with 2  nodules. The patient also has evidence for adenocarcinoma of the left upper lobe and subcentimeter nodules in the right lung concerning for stage IV with multifocal disease bilaterally. She is status post right upper lobectomy with lymph node dissection on August 26, 2020 under the care of Dr. Kipp Brood. Her molecular studies by foundation 1 showed no actionable mutations. She has PD-L1 expression of 20%. She underwent adjuvant treatment with systemic chemotherapy with cisplatin 75 mg/M2 and Alimta 500 mg/M2 every 3 weeks status post 4 cycles, completed Jan 17, 2021. The patient is currently on observation and she is feeling fine with no concerning complaints except for the intermittent right-sided chest pain that has been going on for a while and she is using lidocaine patch and Tylenol.  This is likely from the surgical scar or the gallbladder stone. She had repeat CT scan of the chest performed recently.  I personally and independently reviewed the scan and discussed results with the patient and her husband today. I recommended for the patient to continue on observation with repeat CT scan of the chest in 6 months. For the periductal dilatation, I will refer the patient to [facility] gastroenterology for evaluation and management of her condition. She was advised to call immediately if she has any other concerning symptoms in the interval. The patient voices understanding of current disease status and treatment options and is in agreement with the current care plan.  All questions were answered. The patient knows to call the clinic with any problems, questions or concerns. We can certainly see the patient much sooner if necessary. The total time spent in the appointment was 30 minutes.  Disclaimer: This note was dictated with voice recognition software. Similar sounding words can inadvertently be transcribed and may not be corrected upon review.

## 2022-08-28 ENCOUNTER — Other Ambulatory Visit: Payer: Self-pay | Admitting: Physical Medicine & Rehabilitation

## 2022-08-28 ENCOUNTER — Other Ambulatory Visit: Payer: Self-pay | Admitting: Thoracic Surgery (Cardiothoracic Vascular Surgery)

## 2022-10-06 ENCOUNTER — Telehealth: Payer: Self-pay | Admitting: Physical Medicine & Rehabilitation

## 2022-10-06 NOTE — Telephone Encounter (Signed)
Was given information by physician for a driving school to assist with driving after a stroke, and would like that information again.

## 2022-10-08 NOTE — Telephone Encounter (Signed)
Responded to pt in MyChart message

## 2022-11-01 ENCOUNTER — Other Ambulatory Visit: Payer: Self-pay | Admitting: Physical Medicine & Rehabilitation

## 2022-11-10 ENCOUNTER — Other Ambulatory Visit: Payer: Self-pay | Admitting: Physical Medicine & Rehabilitation

## 2023-02-16 ENCOUNTER — Other Ambulatory Visit: Payer: Self-pay | Admitting: Physical Medicine & Rehabilitation

## 2023-02-19 ENCOUNTER — Other Ambulatory Visit: Payer: Self-pay

## 2023-02-19 ENCOUNTER — Inpatient Hospital Stay: Payer: Medicare HMO | Attending: Internal Medicine

## 2023-02-19 ENCOUNTER — Ambulatory Visit (HOSPITAL_COMMUNITY)
Admission: RE | Admit: 2023-02-19 | Discharge: 2023-02-19 | Disposition: A | Discharge status: Home / Self Care | Payer: Medicare HMO | Source: Ambulatory Visit | Attending: Internal Medicine | Admitting: Internal Medicine

## 2023-02-19 DIAGNOSIS — Z08 Encounter for follow-up examination after completed treatment for malignant neoplasm: Secondary | ICD-10-CM | POA: Insufficient documentation

## 2023-02-19 DIAGNOSIS — C3490 Malignant neoplasm of unspecified part of unspecified bronchus or lung: Secondary | ICD-10-CM

## 2023-02-19 DIAGNOSIS — Z85118 Personal history of other malignant neoplasm of bronchus and lung: Secondary | ICD-10-CM | POA: Insufficient documentation

## 2023-02-19 LAB — CMP (CANCER CENTER ONLY)
ALT: 9 U/L (ref 0–44)
AST: 12 U/L — ABNORMAL LOW (ref 15–41)
Albumin: 4.1 g/dL (ref 3.5–5.0)
Alkaline Phosphatase: 92 U/L (ref 38–126)
Anion gap: 9 (ref 5–15)
BUN: 11 mg/dL (ref 8–23)
CO2: 27 mmol/L (ref 22–32)
Calcium: 10 mg/dL (ref 8.9–10.3)
Chloride: 108 mmol/L (ref 98–111)
Creatinine: 1.04 mg/dL — ABNORMAL HIGH (ref 0.44–1.00)
GFR, Estimated: 56 mL/min — ABNORMAL LOW (ref >60)
Glucose, Bld: 104 mg/dL — ABNORMAL HIGH (ref 70–99)
Potassium: 4.3 mmol/L (ref 3.5–5.1)
Sodium: 144 mmol/L (ref 135–145)
Total Bilirubin: 0.4 mg/dL (ref 0.3–1.2)
Total Protein: 7.4 g/dL (ref 6.5–8.1)

## 2023-02-19 LAB — CBC WITH DIFFERENTIAL (CANCER CENTER ONLY)
Abs Immature Granulocytes: 0.02 10*3/uL (ref 0.00–0.07)
Basophils Absolute: 0.1 10*3/uL (ref 0.0–0.1)
Basophils Relative: 1 %
Eosinophils Absolute: 1.2 10*3/uL — ABNORMAL HIGH (ref 0.0–0.5)
Eosinophils Relative: 17 %
HCT: 37.8 % (ref 36.0–46.0)
Hemoglobin: 12.1 g/dL (ref 12.0–15.0)
Immature Granulocytes: 0 %
Lymphocytes Relative: 17 %
Lymphs Abs: 1.2 10*3/uL (ref 0.7–4.0)
MCH: 31.3 pg (ref 26.0–34.0)
MCHC: 32 g/dL (ref 30.0–36.0)
MCV: 97.9 fL (ref 80.0–100.0)
Monocytes Absolute: 0.7 10*3/uL (ref 0.1–1.0)
Monocytes Relative: 9 %
Neutro Abs: 4 10*3/uL (ref 1.7–7.7)
Neutrophils Relative %: 56 %
Platelet Count: 254 10*3/uL (ref 150–400)
RBC: 3.86 MIL/uL — ABNORMAL LOW (ref 3.87–5.11)
RDW: 11.9 % (ref 11.5–15.5)
WBC Count: 7.2 10*3/uL (ref 4.0–10.5)
nRBC: 0 % (ref 0.0–0.2)

## 2023-02-19 MED ORDER — SODIUM CHLORIDE (PF) 0.9 % IJ SOLN
INTRAMUSCULAR | Status: AC
  Filled 2023-02-19: qty 50

## 2023-02-19 MED ORDER — IOHEXOL 300 MG/ML  SOLN
75.0000 mL | Freq: Once | INTRAMUSCULAR | Status: AC | PRN
  Administered 2023-02-19: 75 mL via INTRAVENOUS

## 2023-02-25 ENCOUNTER — Inpatient Hospital Stay: Payer: Medicare HMO | Admitting: Internal Medicine

## 2023-02-25 ENCOUNTER — Other Ambulatory Visit: Payer: Self-pay

## 2023-02-25 VITALS — BP 132/71 | HR 53 | Temp 98.3°F | Resp 17 | Ht 64.0 in | Wt 174.6 lb

## 2023-02-25 DIAGNOSIS — Z85118 Personal history of other malignant neoplasm of bronchus and lung: Secondary | ICD-10-CM | POA: Diagnosis present

## 2023-02-25 DIAGNOSIS — Z08 Encounter for follow-up examination after completed treatment for malignant neoplasm: Secondary | ICD-10-CM | POA: Diagnosis not present

## 2023-02-25 DIAGNOSIS — C3490 Malignant neoplasm of unspecified part of unspecified bronchus or lung: Secondary | ICD-10-CM

## 2023-02-25 NOTE — Progress Notes (Signed)
[facility] [phone]   [phone]  OFFICE PROGRESS NOTE  Tonya Able, MD [address] [place] [address]  DIAGNOSIS: Stage IIB (T3, N0, M0) non-small cell lung cancer, adenocarcinoma involving the right upper lobe. The patient also has evidence for adenocarcinoma of the left upper lobe lung nodule as well as subcentimeter pulmonary nodules in the right lung concerning for stage IV lung cancer with multifocal disease bilaterally.  This was diagnosed in December  2021  Biomarker Findings  Microsatellite status - MS-Equivocal ? Tumor Mutational Burden - Cannot Be Determined Genomic Findings For a complete list of the genes assayed, please refer to the Appendix. MET D105fs*2 AXL T343M TP53 I254V 7 Disease relevant genes with no reportable alterations: ALK, BRAF, EGFR, ERBB2, KRAS, RET, ROS1  PDL1 Expression 20 %.  PRIOR THERAPY:  1) status post right upper lobectomy with lymph node sampling on August 26, 2020 under the care of Dr. Cliffton Asters. 2) Adjuvant systemic chemotherapy with cisplatin 75 mg/M2 and Alimta 550/M2 every 3 weeks. Last dose 01/17/21.  Status post 4 cycles   CURRENT THERAPY: Observation   INTERVAL HISTORY: Tonya [name] 77 y.o. female returns to the clinic today for follow-up visit accompanied by her husband.  The patient is feeling fine today with no concerning complaints.  She denied having any current chest pain, shortness of breath, cough or hemoptysis.  She has no nausea, vomiting, diarrhea or constipation.  She has no headache or visual changes.  She has no recent weight loss or night sweats.  She is here today for evaluation with repeat CT scan of the chest for restaging of her disease.  MEDICAL HISTORY: Past Medical History:  Diagnosis Date   Adenoma of left adrenal gland    Cancer (HCC)    Lung   Colon polyps    hyperplastic   Complication of anesthesia    Very emotional and cries after  anesthesia   GERD (gastroesophageal reflux disease)    High blood pressure 03/25/2021   Kidney stone    Liver lesion    Microhematuria    Stroke (HCC)     ALLERGIES:  is allergic to other, penicillins, and tizanidine.  MEDICATIONS:  Current Outpatient Medications  Medication Sig Dispense Refill   acetaminophen (TYLENOL) 325 MG tablet Take 1-2 tablets (325-650 mg total) by mouth every 4 (four) hours as needed for mild pain.     albuterol (VENTOLIN HFA) 108 (90 Base) MCG/ACT inhaler Inhale 2 puffs into the lungs every 6 (six) hours as needed for wheezing or shortness of breath. 8 g 2   alendronate (FOSAMAX) 70 MG tablet Take 70 mg by mouth every Saturday.     ALPRAZolam (XANAX) 1 MG tablet Take 1 mg by mouth at bedtime as needed for sleep.     Calcium Carbonate-Vitamin D (CALCIUM + D PO) Take 1 tablet by mouth 2 (two) times daily.     folic acid (FOLVITE) 1 MG tablet TAKE 1 TABLET(1 MG) BY MOUTH DAILY 30 tablet 4   gabapentin (NEURONTIN) 300 MG capsule TAKE 1 CAPSULE(300 MG) BY MOUTH THREE TIMES DAILY 90 capsule 0   lidocaine (LIDODERM) 5 % Place 3 patches onto the skin daily. Apply at 8 am and remove at 8 pm daily. (Patient taking differently: Place 3 patches onto the skin as needed. Apply at 8 am and remove at 8 pm daily.) 90 patch 0   Menthol-Methyl Salicylate (MUSCLE RUB) 10-15 % CREA Apply 1  application topically 2 (two) times daily. (Patient taking differently: Apply 1 application  topically daily as needed for muscle pain.)  0   NARCAN 4 MG/0.1ML LIQD nasal spray kit 1 spray once.     omeprazole (PRILOSEC) 40 MG capsule Take 40 mg by mouth daily.     rosuvastatin (CRESTOR) 10 MG tablet Take 10 mg by mouth at bedtime.     umeclidinium-vilanterol (ANORO ELLIPTA) 62.5-25 MCG/INH AEPB Inhale 1 puff into the lungs daily. 60 each 0   No current facility-administered medications for this visit.    SURGICAL HISTORY:  Past Surgical History:  Procedure Laterality Date   ABDOMINAL  HYSTERECTOMY     APPENDECTOMY     BRONCHIAL BIOPSY  07/18/2020   Procedure: BRONCHIAL BIOPSIES;  Surgeon: Josephine Igo, DO;  Location: [facility];  Service: Pulmonary;;   BRONCHIAL BRUSHINGS  07/18/2020   Procedure: BRONCHIAL BRUSHINGS;  Surgeon: Josephine Igo, DO;  Location: [facility];  Service: Pulmonary;;   BRONCHIAL NEEDLE ASPIRATION BIOPSY  07/18/2020   Procedure: BRONCHIAL NEEDLE ASPIRATION BIOPSIES;  Surgeon: Josephine Igo, DO;  Location: [facility];  Service: Pulmonary;;   BRONCHIAL WASHINGS  07/18/2020   Procedure: BRONCHIAL WASHINGS;  Surgeon: Josephine Igo, DO;  Location: [facility];  Service: Pulmonary;;   FIDUCIAL MARKER PLACEMENT  07/18/2020   Procedure: FIDUCIAL MARKER PLACEMENT;  Surgeon: Josephine Igo, DO;  Location: [facility];  Service: Pulmonary;;   INTERCOSTAL NERVE BLOCK Right 08/26/2020   Procedure: INTERCOSTAL NERVE BLOCK;  Surgeon: Corliss Skains, MD;  Location: [facility];  Service: Thoracic;  Laterality: Right;   IR THORACENTESIS ASP PLEURAL SPACE W/IMG GUIDE  10/10/2020   NODE DISSECTION Right 08/26/2020   Procedure: NODE DISSECTION;  Surgeon: Corliss Skains, MD;  Location: [facility];  Service: Thoracic;  Laterality: Right;   THORACENTESIS Right 11/04/2020   Procedure: Alanson Puls;  Surgeon: Josephine Igo, DO;  Location: [facility];  Service: Pulmonary;  Laterality: Right;   THORACENTESIS Right 11/29/2020   Procedure: THORACENTESIS;  Surgeon: Josephine Igo, DO;  Location[facility];  Service: Pulmonary;  Laterality: Right;   VIDEO BRONCHOSCOPY WITH ENDOBRONCHIAL NAVIGATION N/A 07/18/2020   Procedure: VIDEO BRONCHOSCOPY WITH ENDOBRONCHIAL NAVIGATION;  Surgeon: Josephine Igo, DO;  Location: [facility];  Service: Pulmonary;  Laterality: N/A;   VIDEO BRONCHOSCOPY WITH ENDOBRONCHIAL ULTRASOUND  07/18/2020   Procedure: VIDEO BRONCHOSCOPY WITH ENDOBRONCHIAL ULTRASOUND;  Surgeon: Josephine Igo, DO;  Location: [facility];   Service: Pulmonary;;    REVIEW OF SYSTEMS:  A comprehensive review of systems was negative except for: Respiratory: positive for pleurisy/chest pain   PHYSICAL EXAMINATION: General appearance: alert, cooperative, and no distress Head: Normocephalic, without obvious abnormality, atraumatic Neck: no adenopathy, no JVD, supple, symmetrical, trachea midline, and thyroid not enlarged, symmetric, no tenderness/mass/nodules Lymph nodes: Cervical, supraclavicular, and axillary nodes normal. Resp: clear to auscultation bilaterally Back: symmetric, no curvature. ROM normal. No CVA tenderness. Cardio: regular rate and rhythm, S1, S2 normal, no murmur, click, rub or gallop GI: soft, non-tender; bowel sounds normal; no masses,  no organomegaly Extremities: extremities normal, atraumatic, no cyanosis or edema  ECOG PERFORMANCE STATUS: 1 - Symptomatic but completely ambulatory  Blood pressure 132/71, pulse (!) 53, temperature 98.3 F (36.8 C), temperature source Oral, resp. rate 17, height 5\' 4"  (1.626 m), weight 174 lb 9.6 oz (79.2 kg), SpO2 98 %.  LABORATORY DATA: Lab Results  Component Value Date   WBC 7.2 02/19/2023   HGB 12.1 02/19/2023   HCT  37.8 02/19/2023   MCV 97.9 02/19/2023   PLT 254 02/19/2023      Chemistry      Component Value Date/Time   NA 144 02/19/2023 0802   K 4.3 02/19/2023 0802   CL 108 02/19/2023 0802   CO2 27 02/19/2023 0802   BUN 11 02/19/2023 0802   CREATININE 1.04 (H) 02/19/2023 0802      Component Value Date/Time   CALCIUM 10.0 02/19/2023 0802   ALKPHOS 92 02/19/2023 0802   AST 12 (L) 02/19/2023 0802   ALT 9 02/19/2023 0802   BILITOT 0.4 02/19/2023 0802       RADIOGRAPHIC STUDIES: CT Chest W Contrast  Result Date: 02/25/2023 CLINICAL DATA:  Non-small cell lung cancer restaging. * Tracking Code: BO * EXAM: CT CHEST WITH CONTRAST TECHNIQUE: Multidetector CT imaging of the chest was performed during intravenous contrast administration. RADIATION DOSE  REDUCTION: This exam was performed according to the departmental dose-optimization program which includes automated exposure control, adjustment of the mA and/or kV according to patient size and/or use of iterative reconstruction technique. CONTRAST:  75mL OMNIPAQUE IOHEXOL 300 MG/ML  SOLN COMPARISON:  08/17/2022 FINDINGS: Cardiovascular: Normal heart size. No pericardial effusion. Aortic atherosclerosis. Mediastinum/Nodes: No enlarged mediastinal, hilar, or axillary lymph nodes. Thyroid gland, trachea, and esophagus demonstrate no significant findings. Lungs/Pleura: Emphysema. Status post right upper lobectomy. There is no pleural effusion, airspace consolidation, atelectasis, or pneumothorax. Soft tissue thickening along the posterior right middle lobe along the suture line appears stable from the previous exam, image 63/6. Peripheral banding in the lateral right lower lobe is stable. -spiculated nodule, with nearby fiducial marker, within the apical segment of the left upper lobe measures 0.7 x 0.8 cm, image 22/6. This is unchanged from the previous exam. -Within the anteromedial left apex there is a 4 mm lung nodule, image 19/6. Unchanged from previous exam. -Punctate nodule in the medial right apex measures 2 mm and is stable, image 19/6. -No new or suspicious lung nodules. Upper Abdomen: No acute abnormality. Intrahepatic and common bile duct dilatation appears stable from the prior examination. The common bile duct measures 0.8 cm, image 140/2. Unchanged from previous exam. Left adrenal nodule measures 1.1 cm. This is stable from and compatible with a benign adenoma, image 137/2. No follow-up imaging recommended. Simple appearing left kidney cyst measures 4 cm, image 139/2. This is only partially visualized. No specific follow-up imaging recommended. Musculoskeletal: Thoracic scoliosis deformity is convex towards the left. No acute or suspicious osseous findings. IMPRESSION: 1. Stable CT of the chest status  post right upper lobectomy. 2. Stable appearance of spiculated nodule within the apical segment of the left upper lobe with nearby fiducial marker. 3. No new or suspicious lung nodules identified. 4. Stable appearance of intrahepatic and common bile duct dilatation. 5. Aortic Atherosclerosis (ICD10-I70.0) and Emphysema (ICD10-J43.9). Electronically Signed   By: Signa Kell M.D.   On: 02/25/2023 07:19     ASSESSMENT AND PLAN: This is a very pleasant 77 years old African-American female recently diagnosed with a stage IIb (T3, N0, M0) non-small cell lung cancer, adenocarcinoma involving the right upper lobe with 2 nodules. The patient also has evidence for adenocarcinoma of the left upper lobe and subcentimeter nodules in the right lung concerning for stage IV with multifocal disease bilaterally. She is status post right upper lobectomy with lymph node dissection on August 26, 2020 under the care of Dr. Cliffton Asters. Her molecular studies by [facility] 1 showed no actionable mutations. She has PD-L1 expression of  20%. She underwent adjuvant treatment with systemic chemotherapy with cisplatin 75 mg/M2 and Alimta 500 mg/M2 every 3 weeks status post 4 cycles, completed Jan 17, 2021. The patient is currently on observation and she is feeling fine with no concerning complaints except for intermittent soreness on the right side of the chest from the surgical scar. She had repeat CT scan of the chest performed recently.  I personally and independently reviewed the scan and discussed the result with the patient and her husband. Her scan showed no concerning findings for disease recurrence or metastasis. I recommended for her to continue on observation with repeat CT scan of the chest in 6 months. The patient was advised to call immediately if she has any other concerning symptoms in the interval. The patient voices understanding of current disease status and treatment options and is in agreement with the current  care plan.  All questions were answered. The patient knows to call the clinic with any problems, questions or concerns. We can certainly see the patient much sooner if necessary. The total time spent in the appointment was 20 minutes.  Disclaimer: This note was dictated with voice recognition software. Similar sounding words can inadvertently be transcribed and may not be corrected upon review.

## 2023-04-22 ENCOUNTER — Other Ambulatory Visit: Payer: Self-pay | Admitting: Obstetrics and Gynecology

## 2023-04-22 DIAGNOSIS — E041 Nontoxic single thyroid nodule: Secondary | ICD-10-CM

## 2023-04-28 ENCOUNTER — Ambulatory Visit: Admission: RE | Admit: 2023-04-28 | Payer: Medicare HMO | Source: Ambulatory Visit

## 2023-04-28 DIAGNOSIS — E041 Nontoxic single thyroid nodule: Secondary | ICD-10-CM

## 2023-05-05 ENCOUNTER — Other Ambulatory Visit: Payer: Self-pay | Admitting: Obstetrics and Gynecology

## 2023-05-05 DIAGNOSIS — E041 Nontoxic single thyroid nodule: Secondary | ICD-10-CM

## 2023-06-23 ENCOUNTER — Other Ambulatory Visit: Payer: Self-pay | Admitting: Obstetrics and Gynecology

## 2023-06-23 ENCOUNTER — Ambulatory Visit
Admission: RE | Admit: 2023-06-23 | Discharge: 2023-06-23 | Disposition: A | Discharge status: Home / Self Care | Payer: Medicare HMO | Source: Ambulatory Visit | Attending: Obstetrics and Gynecology | Admitting: Obstetrics and Gynecology

## 2023-06-23 DIAGNOSIS — E041 Nontoxic single thyroid nodule: Secondary | ICD-10-CM

## 2023-08-20 ENCOUNTER — Inpatient Hospital Stay: Payer: Medicare HMO | Attending: Internal Medicine

## 2023-08-20 ENCOUNTER — Ambulatory Visit (HOSPITAL_COMMUNITY)
Admission: RE | Admit: 2023-08-20 | Discharge: 2023-08-20 | Disposition: A | Discharge status: Home / Self Care | Payer: Medicare HMO | Source: Ambulatory Visit | Attending: Internal Medicine | Admitting: Internal Medicine

## 2023-08-20 DIAGNOSIS — C3490 Malignant neoplasm of unspecified part of unspecified bronchus or lung: Secondary | ICD-10-CM

## 2023-08-20 DIAGNOSIS — Z85118 Personal history of other malignant neoplasm of bronchus and lung: Secondary | ICD-10-CM | POA: Insufficient documentation

## 2023-08-20 DIAGNOSIS — Z08 Encounter for follow-up examination after completed treatment for malignant neoplasm: Secondary | ICD-10-CM | POA: Diagnosis present

## 2023-08-20 LAB — CBC WITH DIFFERENTIAL (CANCER CENTER ONLY)
Abs Immature Granulocytes: 0.02 10*3/uL (ref 0.00–0.07)
Basophils Absolute: 0 10*3/uL (ref 0.0–0.1)
Basophils Relative: 0 %
Eosinophils Absolute: 1.1 10*3/uL — ABNORMAL HIGH (ref 0.0–0.5)
Eosinophils Relative: 17 %
HCT: 36.6 % (ref 36.0–46.0)
Hemoglobin: 11.6 g/dL — ABNORMAL LOW (ref 12.0–15.0)
Immature Granulocytes: 0 %
Lymphocytes Relative: 24 %
Lymphs Abs: 1.5 10*3/uL (ref 0.7–4.0)
MCH: 30 pg (ref 26.0–34.0)
MCHC: 31.7 g/dL (ref 30.0–36.0)
MCV: 94.6 fL (ref 80.0–100.0)
Monocytes Absolute: 0.5 10*3/uL (ref 0.1–1.0)
Monocytes Relative: 8 %
Neutro Abs: 3.2 10*3/uL (ref 1.7–7.7)
Neutrophils Relative %: 51 %
Platelet Count: 237 10*3/uL (ref 150–400)
RBC: 3.87 MIL/uL (ref 3.87–5.11)
RDW: 12.6 % (ref 11.5–15.5)
WBC Count: 6.3 10*3/uL (ref 4.0–10.5)
nRBC: 0 % (ref 0.0–0.2)

## 2023-08-20 LAB — CMP (CANCER CENTER ONLY)
ALT: 7 U/L (ref 0–44)
AST: 10 U/L — ABNORMAL LOW (ref 15–41)
Albumin: 4 g/dL (ref 3.5–5.0)
Alkaline Phosphatase: 84 U/L (ref 38–126)
Anion gap: 5 (ref 5–15)
BUN: 10 mg/dL (ref 8–23)
CO2: 28 mmol/L (ref 22–32)
Calcium: 9.4 mg/dL (ref 8.9–10.3)
Chloride: 109 mmol/L (ref 98–111)
Creatinine: 1.01 mg/dL — ABNORMAL HIGH (ref 0.44–1.00)
GFR, Estimated: 58 mL/min — ABNORMAL LOW (ref >60)
Glucose, Bld: 89 mg/dL (ref 70–99)
Potassium: 4.2 mmol/L (ref 3.5–5.1)
Sodium: 142 mmol/L (ref 135–145)
Total Bilirubin: 0.4 mg/dL (ref <1.2)
Total Protein: 7.3 g/dL (ref 6.5–8.1)

## 2023-08-20 MED ORDER — IOHEXOL 300 MG/ML  SOLN
75.0000 mL | Freq: Once | INTRAMUSCULAR | Status: AC | PRN
  Administered 2023-08-20: 75 mL via INTRAVENOUS

## 2023-08-25 ENCOUNTER — Inpatient Hospital Stay: Payer: Medicare HMO | Admitting: Internal Medicine

## 2023-08-25 VITALS — BP 139/65 | HR 61 | Temp 97.9°F | Resp 13 | Wt 176.0 lb

## 2023-08-25 DIAGNOSIS — C3490 Malignant neoplasm of unspecified part of unspecified bronchus or lung: Secondary | ICD-10-CM | POA: Diagnosis not present

## 2023-08-25 DIAGNOSIS — Z08 Encounter for follow-up examination after completed treatment for malignant neoplasm: Secondary | ICD-10-CM | POA: Diagnosis not present

## 2023-08-25 NOTE — Progress Notes (Signed)
[facility] Telephone:([phone]   [phone]  OFFICE PROGRESS NOTE  [name], MD [address] [place] [address]  DIAGNOSIS: Stage IIB (T3, N0, M0) non-small cell lung cancer, adenocarcinoma involving the right upper lobe. The patient also has evidence for adenocarcinoma of the left upper lobe lung nodule as well as subcentimeter pulmonary nodules in the right lung concerning for stage IV lung cancer with multifocal disease bilaterally.  This was diagnosed in December  2021  Biomarker Findings  Microsatellite status - MS-Equivocal ? Tumor Mutational Burden - Cannot Be Determined Genomic Findings For a complete list of the genes assayed, please refer to the Appendix. MET [id number]*2 AXL T343M TP53 I254V 7 Disease relevant genes with no reportable alterations: ALK, BRAF, EGFR, ERBB2, KRAS, RET, ROS1  PDL1 Expression 20 %.  PRIOR THERAPY:  1) status post right upper lobectomy with lymph node sampling on August 26, 2020 under the care of Dr. Cliffton Asters. 2) Adjuvant systemic chemotherapy with cisplatin 75 mg/M2 and Alimta 550/M2 every 3 weeks. Last dose 01/17/21.  Status post 4 cycles   CURRENT THERAPY: Observation   INTERVAL HISTORY: Tonya [name] 77 y.o. female returns to the clinic today for 82-month follow-up visit accompanied by her husband. Discussed the use of AI scribe software for clinical note transcription with the patient, who gave verbal consent to proceed.  History of Present Illness   The patient, under observation for Stage IIB (T3, N0, M0) non-small cell lung cancer, adenocarcinoma involving the right upper lobe. The patient also has evidence for adenocarcinoma of the left upper lobe lung nodule as well as subcentimeter pulmonary nodules in the right lung concerning for stage IV lung cancer with multifocal disease bilaterally. This was diagnosed in December 2021. She reports no new symptoms since the last visit six  months ago. She denies experiencing chest pain, shortness of breath, cough, hemoptysis, nausea, vomiting, diarrhea, or jaundice. However, she continues to experience pain at the site of a surgical scar. This pain is managed with gabapentin and lidocaine patches, but it persists intermittently, lasting for three to four days before disappearing. The patient notes that the pain is worse during the winter months. Despite this, the patient reports feeling generally well.       MEDICAL HISTORY: Past Medical History:  Diagnosis Date   Adenoma of left adrenal gland    Cancer (HCC)    Lung   Colon polyps    hyperplastic   Complication of anesthesia    Very emotional and cries after anesthesia   GERD (gastroesophageal reflux disease)    High blood pressure 03/25/2021   Kidney stone    Liver lesion    Microhematuria    Stroke (HCC)     ALLERGIES:  is allergic to other, penicillins, and tizanidine.  MEDICATIONS:  Current Outpatient Medications  Medication Sig Dispense Refill   acetaminophen (TYLENOL) 325 MG tablet Take 1-2 tablets (325-650 mg total) by mouth every 4 (four) hours as needed for mild pain.     albuterol (VENTOLIN HFA) 108 (90 Base) MCG/ACT inhaler Inhale 2 puffs into the lungs every 6 (six) hours as needed for wheezing or shortness of breath. 8 g 2   alendronate (FOSAMAX) 70 MG tablet Take 70 mg by mouth every Saturday.     ALPRAZolam (XANAX) 1 MG tablet Take 1 mg by mouth at bedtime as needed for sleep.     Calcium Carbonate-Vitamin D (CALCIUM + D PO) Take 1 tablet  by mouth 2 (two) times daily.     folic acid (FOLVITE) 1 MG tablet TAKE 1 TABLET(1 MG) BY MOUTH DAILY 30 tablet 4   gabapentin (NEURONTIN) 300 MG capsule TAKE 1 CAPSULE(300 MG) BY MOUTH THREE TIMES DAILY 90 capsule 0   lidocaine (LIDODERM) 5 % Place 3 patches onto the skin daily. Apply at 8 am and remove at 8 pm daily. (Patient taking differently: Place 3 patches onto the skin as needed. Apply at 8 am and remove at 8  pm daily.) 90 patch 0   Menthol-Methyl Salicylate (MUSCLE RUB) 10-15 % CREA Apply 1 application topically 2 (two) times daily. (Patient taking differently: Apply 1 application  topically daily as needed for muscle pain.)  0   NARCAN 4 MG/0.1ML LIQD nasal spray kit 1 spray once.     omeprazole (PRILOSEC) 40 MG capsule Take 40 mg by mouth daily.     rosuvastatin (CRESTOR) 10 MG tablet Take 10 mg by mouth at bedtime.     umeclidinium-vilanterol (ANORO ELLIPTA) 62.5-25 MCG/INH AEPB Inhale 1 puff into the lungs daily. 60 each 0   No current facility-administered medications for this visit.    SURGICAL HISTORY:  Past Surgical History:  Procedure Laterality Date   ABDOMINAL HYSTERECTOMY     APPENDECTOMY     BRONCHIAL BIOPSY  07/18/2020   Procedure: BRONCHIAL BIOPSIES;  Surgeon: Josephine Igo, DO;  [facility];  Service: Pulmonary;;   BRONCHIAL BRUSHINGS  07/18/2020   Procedure: BRONCHIAL BRUSHINGS;  Surgeon: Josephine Igo, DO;  [facility];  Service: Pulmonary;;   BRONCHIAL NEEDLE ASPIRATION BIOPSY  07/18/2020   Procedure: BRONCHIAL NEEDLE ASPIRATION BIOPSIES;  Surgeon: Josephine Igo, DO;  [facility];  Service: Pulmonary;;   BRONCHIAL WASHINGS  07/18/2020   Procedure: BRONCHIAL WASHINGS;  Surgeon: Josephine Igo, DO;  [facility];  Service: Pulmonary;;   FIDUCIAL MARKER PLACEMENT  07/18/2020   Procedure: FIDUCIAL MARKER PLACEMENT;  Surgeon: Josephine Igo, DO;  [facility];  Service: Pulmonary;;   INTERCOSTAL NERVE BLOCK Right 08/26/2020   Procedure: INTERCOSTAL NERVE BLOCK;  Surgeon: Corliss Skains, MD;  [facility];  Service: Thoracic;  Laterality: Right;   IR THORACENTESIS ASP PLEURAL SPACE W/IMG GUIDE  10/10/2020   NODE DISSECTION Right 08/26/2020   Procedure: NODE DISSECTION;  Surgeon: Corliss Skains, MD;  [facility]: [facility];  Service: Thoracic;  Laterality: Right;   THORACENTESIS Right 11/04/2020   Procedure:  Alanson Puls;  Surgeon: Josephine Igo, DO;  [facility];  Service: Pulmonary;  Laterality: Right;   THORACENTESIS Right 11/29/2020   Procedure: THORACENTESIS;  Surgeon: Josephine Igo, DO;  Location: [facility] ENDOSCOPY;  Service: Pulmonary;  Laterality: Right;   VIDEO BRONCHOSCOPY WITH ENDOBRONCHIAL NAVIGATION N/A 07/18/2020   Procedure: VIDEO BRONCHOSCOPY WITH ENDOBRONCHIAL NAVIGATION;  Surgeon: Josephine Igo, DO;  [facility];  Service: Pulmonary;  Laterality: N/A;   VIDEO BRONCHOSCOPY WITH ENDOBRONCHIAL ULTRASOUND  07/18/2020   Procedure: VIDEO BRONCHOSCOPY WITH ENDOBRONCHIAL ULTRASOUND;  Surgeon: Josephine Igo, DO;  [facility];  Service: Pulmonary;;    REVIEW OF SYSTEMS:  A comprehensive review of systems was negative except for: Respiratory: positive for pleurisy/chest pain   PHYSICAL EXAMINATION: General appearance: alert, cooperative, and no distress Head: Normocephalic, without obvious abnormality, atraumatic Neck: no adenopathy, no JVD, supple, symmetrical, trachea midline, and thyroid not enlarged, symmetric, no tenderness/mass/nodules Lymph nodes: Cervical, supraclavicular, and axillary nodes normal. Resp: clear to auscultation bilaterally Back: symmetric, no curvature.  ROM normal. No CVA tenderness. Cardio: regular rate and rhythm, S1, S2 normal, no murmur, click, rub or gallop GI: soft, non-tender; bowel sounds normal; no masses,  no organomegaly Extremities: extremities normal, atraumatic, no cyanosis or edema  ECOG PERFORMANCE STATUS: 1 - Symptomatic but completely ambulatory  Blood pressure 139/65, pulse 61, temperature 97.9 F (36.6 C), temperature source Temporal, resp. rate 13, weight 176 lb (79.8 kg), SpO2 97%.  LABORATORY DATA: Lab Results  Component Value Date   WBC 6.3 08/20/2023   HGB 11.6 (L) 08/20/2023   HCT 36.6 08/20/2023   MCV 94.6 08/20/2023   PLT 237 08/20/2023      Chemistry      Component Value Date/Time    NA 142 08/20/2023 0842   K 4.2 08/20/2023 0842   CL 109 08/20/2023 0842   CO2 28 08/20/2023 0842   BUN 10 08/20/2023 0842   CREATININE 1.01 (H) 08/20/2023 0842      Component Value Date/Time   CALCIUM 9.4 08/20/2023 0842   ALKPHOS 84 08/20/2023 0842   AST 10 (L) 08/20/2023 0842   ALT 7 08/20/2023 0842   BILITOT 0.4 08/20/2023 0842       RADIOGRAPHIC STUDIES: CT Chest W Contrast Result Date: 08/24/2023 CLINICAL DATA:  Staging non-small-cell lung cancer right lower lobe. Chemo therapy is finished. * Tracking Code: BO *. EXAM: CT CHEST WITH CONTRAST TECHNIQUE: Multidetector CT imaging of the chest was performed during intravenous contrast administration. RADIATION DOSE REDUCTION: This exam was performed according to the departmental dose-optimization program which includes automated exposure control, adjustment of the mA and/or kV according to patient size and/or use of iterative reconstruction technique. CONTRAST:  75mL OMNIPAQUE IOHEXOL 300 MG/ML  SOLN COMPARISON:  CT 02/19/2023 and older. FINDINGS: Cardiovascular: Thoracic aorta has a normal course and caliber with scattered atherosclerotic calcified plaque. Heart is nonenlarged. No pericardial effusion. Mediastinum/Nodes: Heterogeneous thyroid gland. Slightly patulous esophagus. No specific abnormal lymph node enlargement identified in the axillary regions, supraclavicular, chest wall, internal mammary chain, hilum or mediastinum. Lungs/Pleura: Centrilobular emphysematous changes are identified. There is scattered breathing motion. No pleural effusion or pneumothorax identified. In addition on the left there is some lower lobe bronchiectasis with some scarring and atelectatic changes. Metallic foci in the left upper lobe identified once again, unchanged from previous examination. Small spiculated left upper lobe, apical nodule identified previously measuring 7 x 8 mm, today on series 5, image 23 this measures 6 by 6 mm. Left anterior apical  nodule measures 19 mm on series 5, image 19, unchanged from previous. Stable left lower lobe nodule measuring 3 mm on series 5, image 79 as well. Surgical changes in the right lung with some thickening along the suture line and components of nodularity which are stable from previous such as series 5, image 62. Changes from upper lobectomy. Volume loss. Basilar scarring and atelectatic changes. No new right-sided lung nodularity. The 2 mm medial apical right-sided nodule is stable today on series 5, image 18 when adjusted for technique. Upper Abdomen: The adrenal glands on the right is preserved and left shows a focal nodule, unchanged from previous measuring a proximally 10 mm. Biliary ductal dilatation identified once again. Previously common duct of 8 mm and today 11 mm. Please correlate for etiology this finding and further workup when clinically appropriate. Left-sided renal cyst incompletely included in the imaging field but what is seen appears simple. Musculoskeletal: Moderate curvature of the spine with scattered degenerative changes. IMPRESSION: Stable surgical changes of right upper lobectomy  with the adjacent scarring, thickening and fibrotic changes. Scattered tiny bilateral lung nodules including the dominant focus left upper lobe with spiculated 6 mm area, stable. Adjacent fiduciary markers. No developing new lymph node enlargement in the thorax. Stable left adrenal nodule. Increasing dilatation of the visualized portions of the biliary tree of uncertain etiology. Please correlate for history and recommend dedicated further workup of the abdomen when clinically appropriate such as ultrasound or CT scan. Aortic Atherosclerosis (ICD10-I70.0) and Emphysema (ICD10-J43.9). Electronically Signed   By: Karen Kays M.D.   On: 08/24/2023 17:03     ASSESSMENT AND PLAN: This is a very pleasant 77 years old African-American female recently diagnosed with a stage IIb (T3, N0, M0) non-small cell lung cancer,  adenocarcinoma involving the right upper lobe with 2 nodules. The patient also has evidence for adenocarcinoma of the left upper lobe and subcentimeter nodules in the right lung concerning for stage IV with multifocal disease bilaterally. She is status post right upper lobectomy with lymph node dissection on August 26, 2020 under the care of Dr. Cliffton Asters. Her molecular studies by foundation 1 showed no actionable mutations. She has PD-L1 expression of 20%. She underwent adjuvant treatment with systemic chemotherapy with cisplatin 75 mg/M2 and Alimta 500 mg/M2 every 3 weeks status post 4 cycles, completed Jan 17, 2021. The patient is currently on observation and she is feeling fine.    Stage IIB (T3, N0, M0) non-small cell lung cancer, adenocarcinoma involving the right upper lobe. The patient also has evidence for adenocarcinoma of the left upper lobe lung nodule as well as subcentimeter pulmonary nodules in the right lung concerning for stage IV lung cancer with multifocal disease bilaterally.  This was diagnosed in December  2021.  - She is status post right upper lobectomy with lymph node sampling on August 26, 2020 under the care of Dr. Cliffton Asters followed by Adjuvant systemic chemotherapy with cisplatin 75 mg/M2 and Alimta 550/M2 every 3 weeks. Last dose 01/17/21.  Status post 4 cycles. - She had repeat CT scan of the chest performed recently.  I personally and independently reviewed the scan and discussed the result with the patient and her husband.  Her scan showed no concerning findings for disease recurrence or metastasis. -  I recommended for her to continue on observation with repeat CT scan of the chest in 6 months.      Biliary Tree Dilation   Dilatation of a portion of the biliary tree noted on imaging. No current symptoms such as jaundice, nausea, vomiting, or right upper quadrant pain. Advised to seek medical attention if these symptoms develop, as she could indicate a gallbladder  issue or other biliary pathology.   - Monitor for symptoms such as jaundice, nausea, vomiting, and right upper quadrant pain.    Post-Surgical Scar Pain   Chronic intermittent pain at the surgical scar site, exacerbated during winter. Managed with gabapentin and lidocaine patches, providing some relief. No new symptoms such as chest pain, dyspnea, or cough.   - Continue gabapentin   - Continue lidocaine patches    Follow-up   - Schedule follow-up visit in six months   - Order scan and lab work one week before the next visit.       The patient voices understanding of current disease status and treatment options and is in agreement with the current care plan.  All questions were answered. The patient knows to call the clinic with any problems, questions or concerns. We can certainly see the  patient much sooner if necessary. The total time spent in the appointment was 20 minutes.  Disclaimer: This note was dictated with voice recognition software. Similar sounding words can inadvertently be transcribed and may not be corrected upon review.

## 2023-09-10 ENCOUNTER — Encounter: Payer: Self-pay | Admitting: Internal Medicine

## 2023-09-10 ENCOUNTER — Telehealth: Payer: Self-pay | Admitting: Internal Medicine

## 2023-09-10 NOTE — Telephone Encounter (Signed)
 Tonya [name],  Patient called to schedule recall due now in February, please advise on scheduling.  Thank you

## 2023-09-10 NOTE — Telephone Encounter (Signed)
 Given her current age and medical history, she needs an office visit to discuss.  Thanks

## 2023-10-22 ENCOUNTER — Encounter: Payer: Self-pay | Admitting: Internal Medicine

## 2023-11-16 ENCOUNTER — Encounter: Payer: Self-pay | Admitting: Internal Medicine

## 2023-11-23 ENCOUNTER — Ambulatory Visit: Payer: Medicare HMO | Admitting: Internal Medicine

## 2023-11-23 ENCOUNTER — Encounter: Payer: Self-pay | Admitting: Internal Medicine

## 2023-11-23 VITALS — BP 110/70 | HR 71 | Ht 64.0 in | Wt 177.0 lb

## 2023-11-23 DIAGNOSIS — Z860101 Personal history of adenomatous and serrated colon polyps: Secondary | ICD-10-CM | POA: Diagnosis not present

## 2023-11-23 DIAGNOSIS — K625 Hemorrhage of anus and rectum: Secondary | ICD-10-CM

## 2023-11-23 DIAGNOSIS — K6289 Other specified diseases of anus and rectum: Secondary | ICD-10-CM

## 2023-11-23 DIAGNOSIS — Z1211 Encounter for screening for malignant neoplasm of colon: Secondary | ICD-10-CM

## 2023-11-23 MED ORDER — SUTAB 1479-225-188 MG PO TABS: ORAL_TABLET | ORAL | 0 refills | Status: DC

## 2023-11-23 NOTE — Progress Notes (Signed)
 HISTORY OF PRESENT ILLNESS:  Tonya [name] is a 78 y.o. female with past medical history assisted below including a remote history of lung cancer and prior stroke.  Not on anticoagulation.  She presents today regarding rectal bleeding and the need for colonoscopy.  She is accompanied today by her husband Zollie Beckers.  I saw the patient last on October 11, 2013 when she underwent colonoscopy for history of nonadvanced adenomatous colon polyps on index exam 2009.  Colonoscopy revealed diminutive sigmoid colon polyp which was found to plastic.  Exam was normal.  Follow-up in 10 years recommended.  Patient tells me that occasionally she has symptomatic hemorrhoids that may result in some localized discomfort and occasional minor bleeding.  Typically treated with suppositories or gel with improvement.  Recently she has had more persistent symptoms.  No change in bowel habits.  She acknowledges being due for colonoscopy.  She also has GERD for which she takes omeprazole which controls symptoms nicely.  Review of blood work from August 20, 2023 shows unremarkable comprehensive metabolic panel.  Unremarkable CBC with hemoglobin 11.6.  REVIEW OF SYSTEMS:  All non-GI ROS negative unless otherwise HPI. Past Medical History:  Diagnosis Date   Adenoma of left adrenal gland    Cancer (HCC)    Lung   Colon polyps    hyperplastic   Complication of anesthesia    Very emotional and cries after anesthesia   GERD (gastroesophageal reflux disease)    High blood pressure 03/25/2021   Kidney stone    Liver lesion    Microhematuria    Stroke [facility])     Past Surgical History:  Procedure Laterality Date   ABDOMINAL HYSTERECTOMY     APPENDECTOMY     BRONCHIAL BIOPSY  07/18/2020   Procedure: BRONCHIAL BIOPSIES;  Surgeon: Josephine Igo, DO;  [facility];  Service: Pulmonary;;   BRONCHIAL BRUSHINGS  07/18/2020   Procedure: BRONCHIAL BRUSHINGS;  Surgeon: Josephine Igo, DO;  [facility];  Service: Pulmonary;;   BRONCHIAL NEEDLE ASPIRATION BIOPSY  07/18/2020   Procedure: BRONCHIAL NEEDLE ASPIRATION BIOPSIES;  Surgeon: Josephine Igo, DO;  [facility];  Service: Pulmonary;;   BRONCHIAL WASHINGS  07/18/2020   Procedure: BRONCHIAL WASHINGS;  Surgeon: Josephine Igo, DO;  [facility];  Service: Pulmonary;;   FIDUCIAL MARKER PLACEMENT  07/18/2020   Procedure: FIDUCIAL MARKER PLACEMENT;  Surgeon: Josephine Igo, DO;  [facility];  Service: Pulmonary;;   INTERCOSTAL NERVE BLOCK Right 08/26/2020   Procedure: INTERCOSTAL NERVE BLOCK;  Surgeon: Corliss Skains, MD;  [facility];  Service: Thoracic;  Laterality: Right;   IR THORACENTESIS ASP PLEURAL SPACE W/IMG GUIDE  10/10/2020   NODE DISSECTION Right 08/26/2020   Procedure: NODE DISSECTION;  Surgeon: Corliss Skains, MD;  [facility];  Service: Thoracic;  Laterality: Right;   THORACENTESIS Right 11/04/2020   Procedure: Alanson Puls;  Surgeon: Josephine Igo, DO;  [facility];  Service: Pulmonary;  Laterality: Right;   THORACENTESIS Right 11/29/2020   Procedure: THORACENTESIS;  Surgeon: Josephine Igo, DO;  Location: [facility] ENDOSCOPY;  Service: Pulmonary;  Laterality: Right;   VIDEO BRONCHOSCOPY WITH ENDOBRONCHIAL NAVIGATION N/A 07/18/2020   Procedure: VIDEO BRONCHOSCOPY WITH ENDOBRONCHIAL NAVIGATION;  Surgeon: Josephine Igo, DO;  [facility];  Service: Pulmonary;  Laterality: N/A;   VIDEO BRONCHOSCOPY WITH ENDOBRONCHIAL ULTRASOUND  07/18/2020   Procedure: VIDEO BRONCHOSCOPY WITH ENDOBRONCHIAL ULTRASOUND;  Surgeon: Josephine Igo, DO;  [facility];  Service: Pulmonary;;    Social History Jolanta Cabeza Brown-Herbin  reports that she quit smoking about 3 years ago. Her smoking use included cigarettes. She started smoking about 63 years ago. She has a 18 pack-year smoking history. She has never used smokeless tobacco. She reports current  alcohol use. She reports that she does not use drugs.  family history includes Arrhythmia in her mother; Heart attack in her father; Heart disease in her sister.  Allergies  Allergen Reactions   Other Anaphylaxis    Spiders   Penicillins Hives    REACTION: 20 years   Tizanidine     Significant hypotension       PHYSICAL EXAMINATION: Vital signs: BP 110/70   Pulse 71   Ht 5\' 4"  (1.626 m)   Wt 177 lb (80.3 kg)   BMI 30.38 kg/m   Constitutional: generally well-appearing, no acute distress Psychiatric: alert and oriented x3, cooperative Eyes: extraocular movements intact, anicteric, conjunctiva pink Mouth: oral pharynx moist, no lesions Neck: supple no lymphadenopathy Cardiovascular: heart regular rate and rhythm, no murmur Lungs: clear to auscultation bilaterally Abdomen: soft, nontender, nondistended, no obvious ascites, no peritoneal signs, normal bowel sounds, no organomegaly Rectal: Deferred until colonoscopy Extremities: no clubbing, cyanosis, or lower extremity edema bilaterally Skin: no lesions on visible extremities Neuro: No focal deficits.  Cranial nerves intact  ASSESSMENT:  1.  Minor rectal discomfort and bleeding. 2.  History of nonadvanced adenomatous colon polyp. 3.  Previous colonoscopies 2009 and 2015 as described   PLAN:  1.  Schedule colonoscopy for colorectal neoplasia surveillance and to evaluate symptoms of minor bleeding.The nature of the procedure, as well as the risks, benefits, and alternatives were carefully and thoroughly reviewed with the patient. Ample time for discussion and questions allowed. The patient understood, was satisfied, and agreed to proceed. 2.  Further recommendations after the above results available

## 2023-11-23 NOTE — Patient Instructions (Signed)
 You have been scheduled for a colonoscopy. Please follow written instructions given to you at your visit today.   If you use inhalers (even only as needed), please bring them with you on the day of your procedure.  DO NOT TAKE 7 DAYS PRIOR TO TEST- Trulicity (dulaglutide) Ozempic, Wegovy (semaglutide) Mounjaro (tirzepatide) Bydureon Bcise (exanatide extended release)  DO NOT TAKE 1 DAY PRIOR TO YOUR TEST Rybelsus (semaglutide) Adlyxin (lixisenatide) Victoza (liraglutide) Byetta (exanatide) ___________________________________________________________________________  Tonya [name] will receive your bowel preparation through [facility], which ensures the lowest copay and home delivery, with outreach via text or call from an [phone] number. Please respond promptly to avoid rescheduling of your procedure. If you are interested in alternative options or have any questions regarding your prep, please contact them at [phone] ____________________________________________________________________________  Your Provider Has Sent Your Bowel Prep Regimen To [facility]   [facility] will contact you to verify your information and collect your copay, if applicable. Enjoy the comfort of your home while your prescription is mailed to you, FREE of any shipping charges.   [facility] accepts all major insurance benefits and applies discounts & coupons.  Have additional questions?   Chat: [email or web address] Call: [phone] Email: [email or web address] NCPDP: [id number]  How will [facility] contact you?  With a Welcome phone call,  a Welcome text and a checkout link in text form.  Texts you receive from [phone] Are NOT Spam.  *To set up delivery, you must complete the checkout process via link or speak to one of the patient care representatives. If [facility] is unable to reach you, your prescription may be delayed.  To avoid long hold times on the phone, you may also utilize the secure chat  feature on the [facility] website to request that they call you back for transaction completion or to expedite your concerns. _______________________________________________________  If your blood pressure at your visit was 140/90 or greater, please contact your primary care physician to follow up on this.  _______________________________________________________  If you are age 76 or older, your body mass index should be between 23-30. Your Body mass index is 30.38 kg/m. If this is out of the aforementioned range listed, please consider follow up with your Primary Care Provider.  If you are age 46 or younger, your body mass index should be between 19-25. Your Body mass index is 30.38 kg/m. If this is out of the aformentioned range listed, please consider follow up with your Primary Care Provider.   ________________________________________________________  The [facility] providers would like to encourage you to use [facility] to communicate with providers for non-urgent requests or questions.  Due to long hold times on the telephone, sending your provider a message by [facility] may be a faster and more efficient way to get a response.  Please allow 48 business hours for a response.  Please remember that this is for non-urgent requests.  _______________________________________________________

## 2023-12-28 ENCOUNTER — Ambulatory Visit (AMBULATORY_SURGERY_CENTER): Admitting: Internal Medicine

## 2023-12-28 ENCOUNTER — Encounter: Payer: Self-pay | Admitting: Internal Medicine

## 2023-12-28 VITALS — BP 120/72 | HR 74 | Temp 97.7°F | Resp 16 | Ht 64.0 in | Wt 177.0 lb

## 2023-12-28 DIAGNOSIS — K648 Other hemorrhoids: Secondary | ICD-10-CM

## 2023-12-28 DIAGNOSIS — Z1211 Encounter for screening for malignant neoplasm of colon: Secondary | ICD-10-CM | POA: Diagnosis present

## 2023-12-28 DIAGNOSIS — K625 Hemorrhage of anus and rectum: Secondary | ICD-10-CM

## 2023-12-28 DIAGNOSIS — Z860100 Personal history of colon polyps, unspecified: Secondary | ICD-10-CM

## 2023-12-28 MED ORDER — SODIUM CHLORIDE 0.9 % IV SOLN
500.0000 mL | Freq: Once | INTRAVENOUS | Status: DC
Start: 2023-12-28 — End: 2023-12-28

## 2023-12-28 NOTE — Progress Notes (Signed)
 A/O x 3, gd SR's, VSS, report to RN

## 2023-12-28 NOTE — Progress Notes (Signed)
 Expand All Collapse All HISTORY OF PRESENT ILLNESS:   Tonya [name] is a 78 y.o. female with past medical history assisted below including a remote history of lung cancer and prior stroke.  Not on anticoagulation.  She presents today regarding rectal bleeding and the need for colonoscopy.  She is accompanied today by her husband Marlou Sims.   I saw the patient last on October 11, 2013 when she underwent colonoscopy for history of nonadvanced adenomatous colon polyps on index exam 2009.  Colonoscopy revealed diminutive sigmoid colon polyp which was found to plastic.  Exam was normal.  Follow-up in 10 years recommended.   Patient tells me that occasionally she has symptomatic hemorrhoids that may result in some localized discomfort and occasional minor bleeding.  Typically treated with suppositories or gel with improvement.  Recently she has had more persistent symptoms.  No change in bowel habits.  She acknowledges being due for colonoscopy.  She also has GERD for which she takes omeprazole which controls symptoms nicely.   Review of blood work from August 20, 2023 shows unremarkable comprehensive metabolic panel.  Unremarkable CBC with hemoglobin 11.6.   REVIEW OF SYSTEMS:   All non-GI ROS negative unless otherwise HPI.     Past Medical History:  Diagnosis Date   Adenoma of left adrenal gland     Cancer (HCC)      Lung   Colon polyps      hyperplastic   Complication of anesthesia      Very emotional and cries after anesthesia   GERD (gastroesophageal reflux disease)     High blood pressure 03/25/2021   Kidney stone     Liver lesion     Microhematuria     Stroke [facility])                 Past Surgical History:  Procedure Laterality Date   ABDOMINAL HYSTERECTOMY       APPENDECTOMY       BRONCHIAL BIOPSY   07/18/2020    Procedure: BRONCHIAL BIOPSIES;  Surgeon: Prudy Brownie, DO;  Location: [facility];  Service: Pulmonary;;   BRONCHIAL BRUSHINGS   07/18/2020     Procedure: BRONCHIAL BRUSHINGS;  Surgeon: Prudy Brownie, DO;  Location: [facility];  Service: Pulmonary;;   BRONCHIAL NEEDLE ASPIRATION BIOPSY   07/18/2020    Procedure: BRONCHIAL NEEDLE ASPIRATION BIOPSIES;  Surgeon: Prudy Brownie, DO;  Location: [facility];  Service: Pulmonary;;   BRONCHIAL WASHINGS   07/18/2020    Procedure: BRONCHIAL WASHINGS;  Surgeon: Prudy Brownie, DO;  Location: [facility];  Service: Pulmonary;;   FIDUCIAL MARKER PLACEMENT   07/18/2020    Procedure: FIDUCIAL MARKER PLACEMENT;  Surgeon: Prudy Brownie, DO;  Location: [facility];  Service: Pulmonary;;   INTERCOSTAL NERVE BLOCK Right 08/26/2020    Procedure: INTERCOSTAL NERVE BLOCK;  Surgeon: Hilarie Lovely, MD;  Location: [facility];  Service: Thoracic;  Laterality: Right;   IR THORACENTESIS ASP PLEURAL SPACE W/IMG GUIDE   10/10/2020   NODE DISSECTION Right 08/26/2020    Procedure: NODE DISSECTION;  Surgeon: Hilarie Lovely, MD;  Location: [facility];  Service: Thoracic;  Laterality: Right;   THORACENTESIS Right 11/04/2020    Procedure: Antoine Kirsch;  Surgeon: Prudy Brownie, DO;  Location: [facility];  Service: Pulmonary;  Laterality: Right;   THORACENTESIS Right 11/29/2020    Procedure: THORACENTESIS;  Surgeon: Prudy Brownie, DO;  Location: [facility];  Service: Pulmonary;  Laterality: Right;   VIDEO BRONCHOSCOPY WITH  ENDOBRONCHIAL NAVIGATION N/A 07/18/2020    Procedure: VIDEO BRONCHOSCOPY WITH ENDOBRONCHIAL NAVIGATION;  Surgeon: Prudy Brownie, DO;  Location: [facility];  Service: Pulmonary;  Laterality: N/A;   VIDEO BRONCHOSCOPY WITH ENDOBRONCHIAL ULTRASOUND   07/18/2020    Procedure: VIDEO BRONCHOSCOPY WITH ENDOBRONCHIAL ULTRASOUND;  Surgeon: Prudy Brownie, DO;  Location: [facility];  Service: Pulmonary;;          Social History Pinkey Brier Brown-Herbin  reports that she quit smoking about 3 years ago. Her smoking use included cigarettes. She started smoking about 63 years ago.  She has a 18 pack-year smoking history. She has never used smokeless tobacco. She reports current alcohol use. She reports that she does not use drugs.   family history includes Arrhythmia in her mother; Heart attack in her father; Heart disease in her sister.   Allergies       Allergies  Allergen Reactions   Other Anaphylaxis      Spiders   Penicillins Hives      REACTION: 20 years   Tizanidine         Significant hypotension            PHYSICAL EXAMINATION: Vital signs: BP 110/70   Pulse 71   Ht 5\' 4"  (1.626 m)   Wt 177 lb (80.3 kg)   BMI 30.38 kg/m   Constitutional: generally well-appearing, no acute distress Psychiatric: alert and oriented x3, cooperative Eyes: extraocular movements intact, anicteric, conjunctiva pink Mouth: oral pharynx moist, no lesions Neck: supple no lymphadenopathy Cardiovascular: heart regular rate and rhythm, no murmur Lungs: clear to auscultation bilaterally Abdomen: soft, nontender, nondistended, no obvious ascites, no peritoneal signs, normal bowel sounds, no organomegaly Rectal: Deferred until colonoscopy Extremities: no clubbing, cyanosis, or lower extremity edema bilaterally Skin: no lesions on visible extremities Neuro: No focal deficits.  Cranial nerves intact   ASSESSMENT:   1.  Minor rectal discomfort and bleeding. 2.  History of nonadvanced adenomatous colon polyp. 3.  Previous colonoscopies 2009 and 2015 as described     PLAN:   1.  Schedule colonoscopy for colorectal neoplasia surveillance and to evaluate symptoms of minor bleeding.The nature of the procedure, as well as the risks, benefits, and alternatives were carefully and thoroughly reviewed with the patient. Ample time for discussion and questions allowed. The patient understood, was satisfied, and agreed to proceed. 2.  Further recommendations after the above results available

## 2023-12-28 NOTE — Patient Instructions (Signed)
 Handout provided about hemorrhoids.  Resume previous diet.  Continue present medications.  Repeat colonoscopy is not recommended for surveillance.  YOU HAD AN ENDOSCOPIC PROCEDURE TODAY AT THE [facility]:   Refer to the procedure report that was given to you for any specific questions about what was found during the examination.  If the procedure report does not answer your questions, please call your gastroenterologist to clarify.  If you requested that your care partner not be given the details of your procedure findings, then the procedure report has been included in a sealed envelope for you to review at your convenience later.  YOU SHOULD EXPECT: Some feelings of bloating in the abdomen. Passage of more gas than usual.  Walking can help get rid of the air that was put into your GI tract during the procedure and reduce the bloating. If you had a lower endoscopy (such as a colonoscopy or flexible sigmoidoscopy) you may notice spotting of blood in your stool or on the toilet paper. If you underwent a bowel prep for your procedure, you may not have a normal bowel movement for a few days.  Please Note:  You might notice some irritation and congestion in your nose or some drainage.  This is from the oxygen used during your procedure.  There is no need for concern and it should clear up in a day or so.  SYMPTOMS TO REPORT IMMEDIATELY:  Following lower endoscopy (colonoscopy or flexible sigmoidoscopy):  Excessive amounts of blood in the stool  Significant tenderness or worsening of abdominal pains  Swelling of the abdomen that is new, acute  Fever of 100F or higher   For urgent or emergent issues, a gastroenterologist can be reached at any hour by calling [phone]. Do not use MyChart messaging for urgent concerns.    DIET:  We do recommend a small meal at first, but then you may proceed to your regular diet.  Drink plenty of fluids but you should avoid alcoholic beverages  for 24 hours.  ACTIVITY:  You should plan to take it easy for the rest of today and you should NOT DRIVE or use heavy machinery until tomorrow (because of the sedation medicines used during the test).    FOLLOW UP: Our staff will call the number listed on your records the next business day following your procedure.  We will call around 7:15- 8:00 am to check on you and address any questions or concerns that you may have regarding the information given to you following your procedure. If we do not reach you, we will leave a message.     If any biopsies were taken you will be contacted by phone or by letter within the next 1-3 weeks.  Please call us  at [phone] if you have not heard about the biopsies in 3 weeks.    SIGNATURES/CONFIDENTIALITY: You and/or your care partner have signed paperwork which will be entered into your electronic medical record.  These signatures attest to the fact that that the information above on your After Visit Summary has been reviewed and is understood.  Full responsibility of the confidentiality of this discharge information lies with you and/or your care-partner.

## 2023-12-28 NOTE — Op Note (Signed)
 [facility] Patient Name: Tonya [name] Procedure Date: 12/28/2023 7:15 AM MRN: [id number] Endoscopist: Murel Arlington. Elvin Hammer , MD, [id number] Age: 78 Referring MD:  Date of Birth: 03/10/1946 Gender: Female Account #: [id number] Procedure:                Colonoscopy Indications:              High risk colon cancer surveillance: Personal                            history of non-advanced adenoma. Minor rectal                            bleeding. Previous examinations 2009, 2015 Medicines:                Monitored Anesthesia Care Procedure:                Pre-Anesthesia Assessment:                           - Prior to the procedure, a History and Physical                            was performed, and patient medications and                            allergies were reviewed. The patient's tolerance of                            previous anesthesia was also reviewed. The risks                            and benefits of the procedure and the sedation                            options and risks were discussed with the patient.                            All questions were answered, and informed consent                            was obtained. Prior Anticoagulants: The patient has                            taken no anticoagulant or antiplatelet agents.                            After reviewing the risks and benefits, the patient                            was deemed in satisfactory condition to undergo the                            procedure.  After obtaining informed consent, the colonoscope                            was passed under direct vision. Throughout the                            procedure, the patient's blood pressure, pulse, and                            oxygen saturations were monitored continuously. The                            Olympus Scope SN [id number] was introduced through the                            anus and advanced to the  the cecum, identified by                            appendiceal orifice and ileocecal valve. The                            ileocecal valve, appendiceal orifice, and rectum                            were photographed. The quality of the bowel                            preparation was excellent. The colonoscopy was                            performed without difficulty. The patient tolerated                            the procedure well. The bowel preparation used was                            SUPREP via split dose instruction. Scope In: 8:32:50 AM Scope Out: 8:49:50 AM Scope Withdrawal Time: 0 hours 10 minutes 52 seconds  Total Procedure Duration: 0 hours 17 minutes 0 seconds  Findings:                 Internal hemorrhoids were found during                            retroflexion. The hemorrhoids were moderate.                           The exam was otherwise without abnormality on                            direct and retroflexion views. Complications:            No immediate complications. Estimated blood loss:  None. Estimated Blood Loss:     Estimated blood loss: none. Impression:               - Internal hemorrhoids.                           - The examination was otherwise normal on direct                            and retroflexion views.                           - No specimens collected. Recommendation:           - Repeat colonoscopy is not recommended for                            surveillance.                           - Patient has a contact number available for                            emergencies. The signs and symptoms of potential                            delayed complications were discussed with the                            patient. Return to normal activities tomorrow.                            Written discharge instructions were provided to the                            patient.                           - Resume previous  diet.                           - Continue present medications. Murel Arlington. Elvin Hammer, MD 12/28/2023 9:04:54 AM This report has been signed electronically.

## 2023-12-28 NOTE — Progress Notes (Signed)
 Pt's states no medical or surgical changes since previsit or office visit.

## 2023-12-29 ENCOUNTER — Telehealth: Payer: Self-pay

## 2023-12-29 NOTE — Telephone Encounter (Signed)
 Left message on follow up call.

## 2024-02-15 ENCOUNTER — Ambulatory Visit (HOSPITAL_COMMUNITY)
Admission: RE | Admit: 2024-02-15 | Discharge: 2024-02-15 | Disposition: A | Discharge status: Home / Self Care | Payer: Medicare HMO | Source: Ambulatory Visit | Attending: Internal Medicine | Admitting: Internal Medicine

## 2024-02-15 ENCOUNTER — Inpatient Hospital Stay: Payer: Medicare HMO | Attending: Internal Medicine

## 2024-02-15 DIAGNOSIS — Z85118 Personal history of other malignant neoplasm of bronchus and lung: Secondary | ICD-10-CM | POA: Diagnosis present

## 2024-02-15 DIAGNOSIS — C3490 Malignant neoplasm of unspecified part of unspecified bronchus or lung: Secondary | ICD-10-CM | POA: Insufficient documentation

## 2024-02-15 DIAGNOSIS — Z08 Encounter for follow-up examination after completed treatment for malignant neoplasm: Secondary | ICD-10-CM | POA: Insufficient documentation

## 2024-02-15 LAB — CBC WITH DIFFERENTIAL (CANCER CENTER ONLY)
Abs Immature Granulocytes: 0.01 10*3/uL (ref 0.00–0.07)
Basophils Absolute: 0 10*3/uL (ref 0.0–0.1)
Basophils Relative: 1 %
Eosinophils Absolute: 0.9 10*3/uL — ABNORMAL HIGH (ref 0.0–0.5)
Eosinophils Relative: 16 %
HCT: 34.7 % — ABNORMAL LOW (ref 36.0–46.0)
Hemoglobin: 11.5 g/dL — ABNORMAL LOW (ref 12.0–15.0)
Immature Granulocytes: 0 %
Lymphocytes Relative: 25 %
Lymphs Abs: 1.4 10*3/uL (ref 0.7–4.0)
MCH: 30.3 pg (ref 26.0–34.0)
MCHC: 33.1 g/dL (ref 30.0–36.0)
MCV: 91.3 fL (ref 80.0–100.0)
Monocytes Absolute: 0.5 10*3/uL (ref 0.1–1.0)
Monocytes Relative: 9 %
Neutro Abs: 2.7 10*3/uL (ref 1.7–7.7)
Neutrophils Relative %: 49 %
Platelet Count: 211 10*3/uL (ref 150–400)
RBC: 3.8 MIL/uL — ABNORMAL LOW (ref 3.87–5.11)
RDW: 12.6 % (ref 11.5–15.5)
WBC Count: 5.5 10*3/uL (ref 4.0–10.5)
nRBC: 0 % (ref 0.0–0.2)

## 2024-02-15 LAB — CMP (CANCER CENTER ONLY)
ALT: 8 U/L (ref 0–44)
AST: 14 U/L — ABNORMAL LOW (ref 15–41)
Albumin: 4.1 g/dL (ref 3.5–5.0)
Alkaline Phosphatase: 87 U/L (ref 38–126)
Anion gap: 7 (ref 5–15)
BUN: 9 mg/dL (ref 8–23)
CO2: 27 mmol/L (ref 22–32)
Calcium: 9.7 mg/dL (ref 8.9–10.3)
Chloride: 109 mmol/L (ref 98–111)
Creatinine: 1.1 mg/dL — ABNORMAL HIGH (ref 0.44–1.00)
GFR, Estimated: 52 mL/min — ABNORMAL LOW (ref >60)
Glucose, Bld: 97 mg/dL (ref 70–99)
Potassium: 3.7 mmol/L (ref 3.5–5.1)
Sodium: 143 mmol/L (ref 135–145)
Total Bilirubin: 0.7 mg/dL (ref 0.0–1.2)
Total Protein: 7.5 g/dL (ref 6.5–8.1)

## 2024-02-15 MED ORDER — IOHEXOL 300 MG/ML  SOLN
75.0000 mL | Freq: Once | INTRAMUSCULAR | Status: AC | PRN
  Administered 2024-02-15: 75 mL via INTRAVENOUS

## 2024-02-15 MED ORDER — SODIUM CHLORIDE (PF) 0.9 % IJ SOLN
INTRAMUSCULAR | Status: AC
  Filled 2024-02-15: qty 50

## 2024-02-22 ENCOUNTER — Inpatient Hospital Stay: Payer: Medicare HMO | Admitting: Internal Medicine

## 2024-02-22 VITALS — BP 130/65 | HR 60 | Temp 98.7°F | Resp 17 | Wt 174.4 lb

## 2024-02-22 DIAGNOSIS — C3490 Malignant neoplasm of unspecified part of unspecified bronchus or lung: Secondary | ICD-10-CM | POA: Diagnosis not present

## 2024-02-22 DIAGNOSIS — Z08 Encounter for follow-up examination after completed treatment for malignant neoplasm: Secondary | ICD-10-CM | POA: Diagnosis not present

## 2024-02-22 NOTE — Progress Notes (Signed)
 [facility] [phone]   [phone]  OFFICE PROGRESS NOTE  Danella Dunn, MD [address] [place] [address]  DIAGNOSIS: Stage IIB (T3, N0, M0) non-small cell lung cancer, adenocarcinoma involving the right upper lobe. The patient also has evidence for adenocarcinoma of the left upper lobe lung nodule as well as subcentimeter pulmonary nodules in the right lung concerning for stage IV lung cancer with multifocal disease bilaterally.  This was diagnosed in December  2021  Biomarker Findings  Microsatellite status - MS-Equivocal ? Tumor Mutational Burden - Cannot Be Determined Genomic Findings For a complete list of the genes assayed, please refer to the Appendix. MET [id number]*2 AXL T343M TP53 I254V 7 Disease relevant genes with no reportable alterations: ALK, BRAF, EGFR, ERBB2, KRAS, RET, ROS1  PDL1 Expression 20 %.  PRIOR THERAPY:  1) status post right upper lobectomy with lymph node sampling on August 26, 2020 under the care of Dr. Deloise Ferries. 2) Adjuvant systemic chemotherapy with cisplatin  75 mg/M2 and Alimta  500/M2 every 3 weeks. Last dose 01/17/21.  Status post 4 cycles   CURRENT THERAPY: Observation   INTERVAL HISTORY: PEACHES VANOVERBEKE 78 y.o. female returns to the clinic today for 34-month follow-up visit accompanied by her husband. Discussed the use of AI scribe software for clinical note transcription with the patient, who gave verbal consent to proceed.  History of Present Illness   YULIZA CARA is a 78 year old female with stage 2B non-small cell lung cancer who presents for evaluation and repeat CT scan for restaging of her disease. She is accompanied by her husband.  She was diagnosed with stage 2B non-small cell lung cancer, adenocarcinoma, in December 2021. She underwent a right upper lobectomy followed by four cycles of adjuvant systemic chemotherapy with cisplatin  and Alimta , with the last dose  administered in May 2022. Since then, she has been under observation.  She uses a Lanacane patch for discomfort at the surgery site, which she describes as her 'new life.' No chest pain, shortness of breath, hemoptysis, nausea, vomiting, or diarrhea. She has not noticed any significant weight loss, although there is a slight decrease from 177 pounds in April to 174 pounds currently.       MEDICAL HISTORY: Past Medical History:  Diagnosis Date   Adenoma of left adrenal gland    Cancer (HCC)    Lung   Colon polyps    hyperplastic   Complication of anesthesia    Very emotional and cries after anesthesia   GERD (gastroesophageal reflux disease)    High blood pressure 03/25/2021   Kidney stone    Liver lesion    Microhematuria    Stroke (HCC)     ALLERGIES:  is allergic to other, tizanidine , and penicillins.  MEDICATIONS:  Current Outpatient Medications  Medication Sig Dispense Refill   acetaminophen  (TYLENOL ) 325 MG tablet Take 1-2 tablets (325-650 mg total) by mouth every 4 (four) hours as needed for mild pain.     albuterol  (VENTOLIN  HFA) 108 (90 Base) MCG/ACT inhaler Inhale 2 puffs into the lungs every 6 (six) hours as needed for wheezing or shortness of breath. 8 g 2   alendronate (FOSAMAX) 70 MG tablet Take 70 mg by mouth every Saturday.     ALPRAZolam  (XANAX ) 1 MG tablet Take 1 mg by mouth at bedtime as needed for sleep.     Calcium  Carbonate-Vitamin D (CALCIUM  + D PO) Take 1 tablet by mouth 2 (two) times  daily.     clotrimazole-betamethasone (LOTRISONE) cream Apply topically.     fluticasone (FLONASE) 50 MCG/ACT nasal spray Place 2 sprays into both nostrils daily.     folic acid  (FOLVITE ) 1 MG tablet TAKE 1 TABLET(1 MG) BY MOUTH DAILY 30 tablet 4   gabapentin  (NEURONTIN ) 300 MG capsule TAKE 1 CAPSULE(300 MG) BY MOUTH THREE TIMES DAILY 90 capsule 0   hydrOXYzine (ATARAX) 25 MG tablet Take by mouth.     lidocaine  (LIDODERM ) 5 % Place 3 patches onto the skin daily. Apply at 8  am and remove at 8 pm daily. (Patient taking differently: Place 3 patches onto the skin as needed. Apply at 8 am and remove at 8 pm daily.) 90 patch 0   NARCAN 4 MG/0.1ML LIQD nasal spray kit 1 spray once.     omeprazole (PRILOSEC) 40 MG capsule Take 40 mg by mouth daily.     Oxycodone  HCl 10 MG TABS Take 10 mg by mouth 3 (three) times daily.     rosuvastatin  (CRESTOR ) 10 MG tablet Take 10 mg by mouth at bedtime.     umeclidinium-vilanterol (ANORO ELLIPTA ) 62.5-25 MCG/INH AEPB Inhale 1 puff into the lungs daily. 60 each 0   No current facility-administered medications for this visit.    SURGICAL HISTORY:  Past Surgical History:  Procedure Laterality Date   ABDOMINAL HYSTERECTOMY     APPENDECTOMY     BRONCHIAL BIOPSY  07/18/2020   Procedure: BRONCHIAL BIOPSIES;  Surgeon: Prudy Brownie, DO;  [facility]: [facility];  Service: Pulmonary;;   BRONCHIAL BRUSHINGS  07/18/2020   Procedure: BRONCHIAL BRUSHINGS;  Surgeon: Prudy Brownie, DO;  [facility];  Service: Pulmonary;;   BRONCHIAL NEEDLE ASPIRATION BIOPSY  07/18/2020   Procedure: BRONCHIAL NEEDLE ASPIRATION BIOPSIES;  Surgeon: Prudy Brownie, DO;  [facility];  Service: Pulmonary;;   BRONCHIAL WASHINGS  07/18/2020   Procedure: BRONCHIAL WASHINGS;  Surgeon: Prudy Brownie, DO;  [facility];  Service: Pulmonary;;   FIDUCIAL MARKER PLACEMENT  07/18/2020   Procedure: FIDUCIAL MARKER PLACEMENT;  Surgeon: Prudy Brownie, DO;  [facility]: [facility];  Service: Pulmonary;;   INTERCOSTAL NERVE BLOCK Right 08/26/2020   Procedure: INTERCOSTAL NERVE BLOCK;  Surgeon: Hilarie Lovely, MD;  [facility]: [facility];  Service: Thoracic;  Laterality: Right;   IR THORACENTESIS ASP PLEURAL SPACE W/IMG GUIDE  10/10/2020   NODE DISSECTION Right 08/26/2020   Procedure: NODE DISSECTION;  Surgeon: Hilarie Lovely, MD;  [facility]: [facility];  Service: Thoracic;  Laterality: Right;   THORACENTESIS Right 11/04/2020   Procedure:  Antoine Kirsch;  Surgeon: Prudy Brownie, DO;  [facility];  Service: Pulmonary;  Laterality: Right;   THORACENTESIS Right 11/29/2020   Procedure: THORACENTESIS;  Surgeon: Prudy Brownie, DO;  Location[facility];  Service: Pulmonary;  Laterality: Right;   VIDEO BRONCHOSCOPY WITH ENDOBRONCHIAL NAVIGATION N/A 07/18/2020   Procedure: VIDEO BRONCHOSCOPY WITH ENDOBRONCHIAL NAVIGATION;  Surgeon: Prudy Brownie, DO;  [facility];  Service: Pulmonary;  Laterality: N/A;   VIDEO BRONCHOSCOPY WITH ENDOBRONCHIAL ULTRASOUND  07/18/2020   Procedure: VIDEO BRONCHOSCOPY WITH ENDOBRONCHIAL ULTRASOUND;  Surgeon: Prudy Brownie, DO;  [facility];  Service: Pulmonary;;    REVIEW OF SYSTEMS:  A comprehensive review of systems was negative except for: Respiratory: positive for pleurisy/chest pain   PHYSICAL EXAMINATION: General appearance: alert, cooperative, and no distress Head: Normocephalic, without obvious abnormality, atraumatic Neck: no adenopathy, no JVD, supple, symmetrical, trachea midline, and thyroid  not enlarged, symmetric, no  tenderness/mass/nodules Lymph nodes: Cervical, supraclavicular, and axillary nodes normal. Resp: clear to auscultation bilaterally Back: symmetric, no curvature. ROM normal. No CVA tenderness. Cardio: regular rate and rhythm, S1, S2 normal, no murmur, click, rub or gallop GI: soft, non-tender; bowel sounds normal; no masses,  no organomegaly Extremities: extremities normal, atraumatic, no cyanosis or edema  ECOG PERFORMANCE STATUS: 1 - Symptomatic but completely ambulatory  Blood pressure 130/65, pulse 60, temperature 98.7 F (37.1 C), temperature source Oral, resp. rate 17, weight 174 lb 6.4 oz (79.1 kg), SpO2 97%.  LABORATORY DATA: Lab Results  Component Value Date   WBC 5.5 02/15/2024   HGB 11.5 (L) 02/15/2024   HCT 34.7 (L) 02/15/2024   MCV 91.3 02/15/2024   PLT 211 02/15/2024      Chemistry      Component Value  Date/Time   NA 143 02/15/2024 0857   K 3.7 02/15/2024 0857   CL 109 02/15/2024 0857   CO2 27 02/15/2024 0857   BUN 9 02/15/2024 0857   CREATININE 1.10 (H) 02/15/2024 0857      Component Value Date/Time   CALCIUM  9.7 02/15/2024 0857   ALKPHOS 87 02/15/2024 0857   AST 14 (L) 02/15/2024 0857   ALT 8 02/15/2024 0857   BILITOT 0.7 02/15/2024 0857       RADIOGRAPHIC STUDIES: CT Chest W Contrast Result Date: 02/19/2024 EXAM: CT CHEST WITH CONTRAST 02/15/2024 10:19:00 AM TECHNIQUE: CT of the chest was performed with the administration of intravenous contrast. Multiplanar reformatted images are provided for review. Automated exposure control, iterative reconstruction, and/or weight based adjustment of the mA/kV was utilized to reduce the radiation dose to as low as reasonably achievable. COMPARISON: 08/20/2023 CLINICAL HISTORY: Non-small cell lung cancer (NSCLC), staging. NSCLC, staging. Pt reports completing chemo. Scan is 6-mo F/U. Pt reports no new symptoms since prior scan. FINDINGS: MEDIASTINUM: Heart and pericardium are unremarkable. The central airways are clear. Thoracic aortic atherosclerosis. LYMPH NODES: No mediastinal, hilar or axillary lymphadenopathy. LUNGS AND PLEURA: Status post right upper lobectomy. Stable scarring / postsurgical changes posteriorly in the right middle lobe. Stable 6 mm irregular nodule in the left lung apex (image 27) with adjacent fiducial marker. While suspicious, this remains unchanged. Additional small left lung nodules measuring up to 4 mm. Stable 5 mm ground glass nodular opacity at the right lung base (image 82). No pleural effusion or pneumothorax. SOFT TISSUES/BONES: Mild s-shaped thoracolumbar scoliosis with lower thoracic degenerative changes. No acute abnormality of the soft tissues. UPPER ABDOMEN: Stable moderate intrahepatic ductal dilatation. 2.6 cm simple left upper pole renal cyst (image 143), benign (Bosniak 1). Per consensus, no follow-up is needed  for simple Bosniak type 1 and 2 renal cysts, unless the patient has a malignancy history or risk factors. Atherosclerotic calcifications of the abdominal aorta. IMPRESSION: 1. Status post right upper lobectomy. 2. Stable 6 mm irregular left upper lobe nodule, suspicious, but unchanged. 3. Additional stable ancillary findings as above. Electronically signed by: Zadie Herter MD 02/19/2024 01:27 AM EDT RP Workstation: [id number]     ASSESSMENT AND PLAN: This is a very pleasant 78 years old African-American female recently diagnosed with a stage IIb (T3, N0, M0) non-small cell lung cancer, adenocarcinoma involving the right upper lobe with 2 nodules. The patient also has evidence for adenocarcinoma of the left upper lobe and subcentimeter nodules in the right lung concerning for stage IV with multifocal disease bilaterally. She is status post right upper lobectomy with lymph node dissection on August 26, 2020 under the care  of Dr. Deloise Ferries. Her molecular studies by foundation 1 showed no actionable mutations. She has PD-L1 expression of 20%. She underwent adjuvant treatment with systemic chemotherapy with cisplatin  75 mg/M2 and Alimta  500 mg/M2 every 3 weeks status post 4 cycles, completed Jan 17, 2021. The patient had repeat CT scan of the chest performed recently.  I personally independently reviewed the scan and discussed the result with the patient today. Her scan showed no concerning findings for disease recurrence or metastasis and she continues to have a stable 6 mm left upper lobe nodule. Assessment and Plan    Non-small cell lung cancer, adenocarcinoma, Stage 2B Status post right upper lobectomy and adjuvant chemotherapy with cisplatin  and Alimta . Last chemotherapy dose in May 2022. Currently asymptomatic with no chest pain, dyspnea, hemoptysis, or significant weight loss. Slight weight loss since April, not concerning. No nausea, vomiting, or diarrhea. Recent CT scan shows stable disease  with no change in the left lung nodule. - Schedule follow-up CT scan in six months.  Lung nodule 6 mm nodule in the left lung, unchanged over the past six months. Suspicious but no evidence of progression. - Follow-up imaging in six months.   She was advised to call immediately if she has any other concerning symptoms in the interval. The patient voices understanding of current disease status and treatment options and is in agreement with the current care plan.  All questions were answered. The patient knows to call the clinic with any problems, questions or concerns. We can certainly see the patient much sooner if necessary. The total time spent in the appointment was 20 minutes.  Disclaimer: This note was dictated with voice recognition software. Similar sounding words can inadvertently be transcribed and may not be corrected upon review.

## 2024-07-26 ENCOUNTER — Other Ambulatory Visit: Payer: Self-pay

## 2024-07-26 ENCOUNTER — Emergency Department (HOSPITAL_COMMUNITY)
Admission: EM | Admit: 2024-07-26 | Discharge: 2024-07-26 | Disposition: A | Discharge status: Home / Self Care | Attending: Emergency Medicine | Admitting: Emergency Medicine

## 2024-07-26 ENCOUNTER — Emergency Department (HOSPITAL_COMMUNITY)

## 2024-07-26 ENCOUNTER — Encounter (HOSPITAL_COMMUNITY): Payer: Self-pay | Admitting: Emergency Medicine

## 2024-07-26 DIAGNOSIS — I10 Essential (primary) hypertension: Secondary | ICD-10-CM | POA: Insufficient documentation

## 2024-07-26 DIAGNOSIS — R519 Headache, unspecified: Secondary | ICD-10-CM | POA: Diagnosis present

## 2024-07-26 DIAGNOSIS — R29818 Other symptoms and signs involving the nervous system: Secondary | ICD-10-CM | POA: Diagnosis not present

## 2024-07-26 DIAGNOSIS — Z8673 Personal history of transient ischemic attack (TIA), and cerebral infarction without residual deficits: Secondary | ICD-10-CM | POA: Insufficient documentation

## 2024-07-26 DIAGNOSIS — M79604 Pain in right leg: Secondary | ICD-10-CM

## 2024-07-26 DIAGNOSIS — R531 Weakness: Secondary | ICD-10-CM

## 2024-07-26 DIAGNOSIS — K219 Gastro-esophageal reflux disease without esophagitis: Secondary | ICD-10-CM | POA: Diagnosis not present

## 2024-07-26 DIAGNOSIS — Z85118 Personal history of other malignant neoplasm of bronchus and lung: Secondary | ICD-10-CM | POA: Insufficient documentation

## 2024-07-26 DIAGNOSIS — Z87891 Personal history of nicotine dependence: Secondary | ICD-10-CM | POA: Diagnosis not present

## 2024-07-26 DIAGNOSIS — Z79899 Other long term (current) drug therapy: Secondary | ICD-10-CM | POA: Diagnosis not present

## 2024-07-26 LAB — HEPATIC FUNCTION PANEL
ALT: 13 U/L (ref 0–44)
AST: 25 U/L (ref 15–41)
Albumin: 3.8 g/dL (ref 3.5–5.0)
Alkaline Phosphatase: 69 U/L (ref 38–126)
Bilirubin, Direct: 0.1 mg/dL (ref 0.0–0.2)
Indirect Bilirubin: 1 mg/dL — ABNORMAL HIGH (ref 0.3–0.9)
Total Bilirubin: 1.1 mg/dL (ref 0.0–1.2)
Total Protein: 7.7 g/dL (ref 6.5–8.1)

## 2024-07-26 LAB — DIFFERENTIAL
Abs Immature Granulocytes: 0.02 K/uL (ref 0.00–0.07)
Basophils Absolute: 0.1 K/uL (ref 0.0–0.1)
Basophils Relative: 1 %
Eosinophils Absolute: 0.7 K/uL — ABNORMAL HIGH (ref 0.0–0.5)
Eosinophils Relative: 10 %
Immature Granulocytes: 0 %
Lymphocytes Relative: 26 %
Lymphs Abs: 1.9 K/uL (ref 0.7–4.0)
Monocytes Absolute: 0.6 K/uL (ref 0.1–1.0)
Monocytes Relative: 8 %
Neutro Abs: 4 K/uL (ref 1.7–7.7)
Neutrophils Relative %: 55 %

## 2024-07-26 LAB — URINALYSIS, W/ REFLEX TO CULTURE (INFECTION SUSPECTED)
Bilirubin Urine: NEGATIVE
Glucose, UA: NEGATIVE mg/dL
Hgb urine dipstick: NEGATIVE
Ketones, ur: NEGATIVE mg/dL
Leukocytes,Ua: NEGATIVE
Nitrite: NEGATIVE
Protein, ur: NEGATIVE mg/dL
Specific Gravity, Urine: >1.046 — ABNORMAL HIGH (ref 1.005–1.030)
pH: 6 (ref 5.0–8.0)

## 2024-07-26 LAB — PROTIME-INR
INR: 1.1 (ref 0.8–1.2)
Prothrombin Time: 14.8 s (ref 11.4–15.2)

## 2024-07-26 LAB — COMPREHENSIVE METABOLIC PANEL WITH GFR
ALT: 15 U/L (ref 0–44)
AST: 22 U/L (ref 15–41)
Albumin: 3.9 g/dL (ref 3.5–5.0)
Alkaline Phosphatase: 70 U/L (ref 38–126)
Anion gap: 17 — ABNORMAL HIGH (ref 5–15)
BUN: 8 mg/dL (ref 8–23)
CO2: 19 mmol/L — ABNORMAL LOW (ref 22–32)
Calcium: 10.9 mg/dL — ABNORMAL HIGH (ref 8.9–10.3)
Chloride: 107 mmol/L (ref 98–111)
Creatinine, Ser: 1 mg/dL (ref 0.44–1.00)
GFR, Estimated: 58 mL/min — ABNORMAL LOW (ref >60)
Glucose, Bld: 116 mg/dL — ABNORMAL HIGH (ref 70–99)
Potassium: 3.9 mmol/L (ref 3.5–5.1)
Sodium: 143 mmol/L (ref 135–145)
Total Bilirubin: 0.8 mg/dL (ref 0.0–1.2)
Total Protein: 7.5 g/dL (ref 6.5–8.1)

## 2024-07-26 LAB — I-STAT CHEM 8, ED
BUN: 8 mg/dL (ref 8–23)
Calcium, Ion: 1.26 mmol/L (ref 1.15–1.40)
Chloride: 110 mmol/L (ref 98–111)
Creatinine, Ser: 1.1 mg/dL — ABNORMAL HIGH (ref 0.44–1.00)
Glucose, Bld: 115 mg/dL — ABNORMAL HIGH (ref 70–99)
HCT: 39 % (ref 36.0–46.0)
Hemoglobin: 13.3 g/dL (ref 12.0–15.0)
Potassium: 3.8 mmol/L (ref 3.5–5.1)
Sodium: 142 mmol/L (ref 135–145)
TCO2: 23 mmol/L (ref 22–32)

## 2024-07-26 LAB — APTT: aPTT: 32 s (ref 24–36)

## 2024-07-26 LAB — CBC
HCT: 38.8 % (ref 36.0–46.0)
Hemoglobin: 12.6 g/dL (ref 12.0–15.0)
MCH: 30.6 pg (ref 26.0–34.0)
MCHC: 32.5 g/dL (ref 30.0–36.0)
MCV: 94.2 fL (ref 80.0–100.0)
Platelets: 246 K/uL (ref 150–400)
RBC: 4.12 MIL/uL (ref 3.87–5.11)
RDW: 13.1 % (ref 11.5–15.5)
WBC: 7.3 K/uL (ref 4.0–10.5)
nRBC: 0 % (ref 0.0–0.2)

## 2024-07-26 LAB — TROPONIN I (HIGH SENSITIVITY): Troponin I (High Sensitivity): 7 ng/L (ref <18)

## 2024-07-26 LAB — CBG MONITORING, ED: Glucose-Capillary: 111 mg/dL — ABNORMAL HIGH (ref 70–99)

## 2024-07-26 LAB — ETHANOL: Alcohol, Ethyl (B): <15 mg/dL (ref <15)

## 2024-07-26 MED ORDER — PROCHLORPERAZINE EDISYLATE 10 MG/2ML IJ SOLN
5.0000 mg | Freq: Once | INTRAMUSCULAR | Status: AC
  Administered 2024-07-26: 5 mg via INTRAVENOUS
  Filled 2024-07-26: qty 2

## 2024-07-26 MED ORDER — KETOROLAC TROMETHAMINE 15 MG/ML IJ SOLN
15.0000 mg | Freq: Once | INTRAMUSCULAR | Status: AC
  Administered 2024-07-26: 15 mg via INTRAVENOUS
  Filled 2024-07-26: qty 1

## 2024-07-26 MED ORDER — LIDOCAINE 5 % EX PTCH
1.0000 | MEDICATED_PATCH | Freq: Once | CUTANEOUS | Status: DC
  Administered 2024-07-26: 1 via TRANSDERMAL
  Filled 2024-07-26: qty 1

## 2024-07-26 MED ORDER — ACETAMINOPHEN 500 MG PO TABS
1000.0000 mg | ORAL_TABLET | Freq: Once | ORAL | Status: AC
  Administered 2024-07-26: 1000 mg via ORAL
  Filled 2024-07-26: qty 2

## 2024-07-26 MED ORDER — IOHEXOL 350 MG/ML SOLN
100.0000 mL | Freq: Once | INTRAVENOUS | Status: AC | PRN
Start: 2024-07-26 — End: 2024-07-26
  Administered 2024-07-26: 100 mL via INTRAVENOUS

## 2024-07-26 MED ORDER — SODIUM CHLORIDE 0.9% FLUSH: 3.0000 mL | Freq: Once | INTRAVENOUS | Status: DC

## 2024-07-26 MED ORDER — LACTATED RINGERS IV BOLUS
1000.0000 mL | Freq: Once | INTRAVENOUS | Status: AC
  Administered 2024-07-26: 1000 mL via INTRAVENOUS

## 2024-07-26 NOTE — Consult Note (Signed)
 NEUROLOGY CONSULT NOTE   Date of service: July 26, 2024 Patient Name: Tonya [name] MRN:  [id number] DOB:  [birth year] Chief Complaint: Code stroke  Requesting Provider: Rogelia Jerilynn RAMAN, MD  History of Present Illness  Tonya [name] is a 78 y.o. female with hx of lung cancer, HTN, GERD, prior ICH who presents via EMS for acute onset of shooting pain in her right leg, left leg weakness and pressure pain under left breast. LKW 0630. Patient endorses having a headache since Monday. NIHSS 4 CT head with no acute process. CTA head and neck delayed due to inability to obtain PIV. Due to pressure pain under left breast obtained CT chest abd and pelvis to rule out dissection . CTA head and neck with no LVO   LKW: 0630 Modified rankin score: 1-No significant post stroke disability and can perform usual duties with stroke symptoms IV Thrombolysis:  No not a stroke  EVT: NO LVO    NIHSS components Score: Comment  1a Level of Conscious 0[]  1[]  2[]  3[]      1b LOC Questions 0[]  1[]  2[]       1c LOC Commands 0[]  1[]  2[]       2 Best Gaze 0[]  1[]  2[]       3 Visual 0[]  1[]  2[]  3[]      4 Facial Palsy 0[]  1[x]  2[]  3[]      5a Motor Arm - left 0[]  1[x]  2[]  3[]  4[]  UN[]    5b Motor Arm - Right 0[]  1[]  2[]  3[]  4[]  UN[]    6a Motor Leg - Left 0[]  1[x]  2[]  3[]  4[]  UN[]    6b Motor Leg - Right 0[]  1[]  2[]  3[]  4[]  UN[]    7 Limb Ataxia 0[]  1[]  2[]  UN[]      8 Sensory 0[]  1[x]  2[]  UN[]      9 Best Language 0[]  1[]  2[]  3[]      10 Dysarthria 0[]  1[]  2[]  UN[]      11 Extinct. and Inattention 0[]  1[]  2[]       TOTAL: 4      ROS  Comprehensive ROS performed and pertinent positives documented in HPI   Past History   Past Medical History:  Diagnosis Date   Adenoma of left adrenal gland    Cancer (HCC)    Lung   Colon polyps    hyperplastic   Complication of anesthesia    Very emotional and cries after anesthesia   GERD (gastroesophageal reflux disease)    High blood pressure  03/25/2021   Kidney stone    Liver lesion    Microhematuria    Stroke [facility])     Past Surgical History:  Procedure Laterality Date   ABDOMINAL HYSTERECTOMY     APPENDECTOMY     BRONCHIAL BIOPSY  07/18/2020   Procedure: BRONCHIAL BIOPSIES;  Surgeon: Brenna Adine CROME, DO;  [facility];  Service: Pulmonary;;   BRONCHIAL BRUSHINGS  07/18/2020   Procedure: BRONCHIAL BRUSHINGS;  Surgeon: Brenna Adine CROME, DO;  [facility];  Service: Pulmonary;;   BRONCHIAL NEEDLE ASPIRATION BIOPSY  07/18/2020   Procedure: BRONCHIAL NEEDLE ASPIRATION BIOPSIES;  Surgeon: Brenna Adine CROME, DO;  [facility];  Service: Pulmonary;;   BRONCHIAL WASHINGS  07/18/2020   Procedure: BRONCHIAL WASHINGS;  Surgeon: Brenna Adine CROME, DO;  [facility];  Service: Pulmonary;;   FIDUCIAL MARKER PLACEMENT  07/18/2020   Procedure: FIDUCIAL MARKER PLACEMENT;  Surgeon: Brenna Adine CROME, DO;  [facility];  Service: Pulmonary;;   INTERCOSTAL NERVE BLOCK Right 08/26/2020  Procedure: INTERCOSTAL NERVE BLOCK;  Surgeon: Shyrl Linnie KIDD, MD;  [facility];  Service: Thoracic;  Laterality: Right;   IR THORACENTESIS ASP PLEURAL SPACE W/IMG GUIDE  10/10/2020   NODE DISSECTION Right 08/26/2020   Procedure: NODE DISSECTION;  Surgeon: Shyrl Linnie KIDD, MD;  [facility];  Service: Thoracic;  Laterality: Right;   THORACENTESIS Right 11/04/2020   Procedure: MILANA;  Surgeon: Brenna Adine CROME, DO;  [facility];  Service: Pulmonary;  Laterality: Right;   THORACENTESIS Right 11/29/2020   Procedure: THORACENTESIS;  Surgeon: Brenna Adine CROME, DO;  Location: [facility] ENDOSCOPY;  Service: Pulmonary;  Laterality: Right;   VIDEO BRONCHOSCOPY WITH ENDOBRONCHIAL NAVIGATION N/A 07/18/2020   Procedure: VIDEO BRONCHOSCOPY WITH ENDOBRONCHIAL NAVIGATION;  Surgeon: Brenna Adine CROME, DO;  [facility];  Service: Pulmonary;  Laterality: N/A;   VIDEO BRONCHOSCOPY WITH  ENDOBRONCHIAL ULTRASOUND  07/18/2020   Procedure: VIDEO BRONCHOSCOPY WITH ENDOBRONCHIAL ULTRASOUND;  Surgeon: Brenna Adine CROME, DO;  [facility];  Service: Pulmonary;;    Family History: Family History  Problem Relation Age of Onset   Arrhythmia Mother        s/p Pacer    Heart attack Father    Heart disease Sister    Colon cancer Neg Hx    Colon polyps Neg Hx    Esophageal cancer Neg Hx    Stomach cancer Neg Hx    Rectal cancer Neg Hx     Social History  reports that she quit smoking about 4 years ago. Her smoking use included cigarettes. She started smoking about 64 years ago. She has a 18 pack-year smoking history. She has never used smokeless tobacco. She reports current alcohol use. She reports that she does not use drugs.  Allergies  Allergen Reactions   Other Anaphylaxis    Spiders   Tizanidine  Other (See Comments)    Significant hypotension   Penicillins Hives    REACTION: 20 years    Medications   Current Facility-Administered Medications:    sodium chloride  flush (NS) 0.9 % injection 3 mL, 3 mL, Intravenous, Once, Rogelia Jerilynn RAMAN, MD  Current Outpatient Medications:    acetaminophen  (TYLENOL ) 325 MG tablet, Take 1-2 tablets (325-650 mg total) by mouth every 4 (four) hours as needed for mild pain., Disp: , Rfl:    albuterol  (VENTOLIN  HFA) 108 (90 Base) MCG/ACT inhaler, Inhale 2 puffs into the lungs every 6 (six) hours as needed for wheezing or shortness of breath., Disp: 8 g, Rfl: 2   alendronate (FOSAMAX) 70 MG tablet, Take 70 mg by mouth every Saturday., Disp: , Rfl:    ALPRAZolam  (XANAX ) 1 MG tablet, Take 1 mg by mouth at bedtime as needed for sleep., Disp: , Rfl:    Calcium  Carbonate-Vitamin D (CALCIUM  + D PO), Take 1 tablet by mouth 2 (two) times daily., Disp: , Rfl:    clotrimazole-betamethasone (LOTRISONE) cream, Apply topically., Disp: , Rfl:    fluticasone (FLONASE) 50 MCG/ACT nasal spray, Place 2 sprays into both nostrils daily., Disp: ,  Rfl:    folic acid  (FOLVITE ) 1 MG tablet, TAKE 1 TABLET(1 MG) BY MOUTH DAILY, Disp: 30 tablet, Rfl: 4   gabapentin  (NEURONTIN ) 300 MG capsule, TAKE 1 CAPSULE(300 MG) BY MOUTH THREE TIMES DAILY, Disp: 90 capsule, Rfl: 0   hydrOXYzine (ATARAX) 25 MG tablet, Take by mouth., Disp: , Rfl:    lidocaine  (LIDODERM ) 5 %, Place 3 patches onto the skin daily. Apply at 8 am and remove at 8 pm daily. (Patient  taking differently: Place 3 patches onto the skin as needed. Apply at 8 am and remove at 8 pm daily.), Disp: 90 patch, Rfl: 0   NARCAN 4 MG/0.1ML LIQD nasal spray kit, 1 spray once., Disp: , Rfl:    omeprazole (PRILOSEC) 40 MG capsule, Take 40 mg by mouth daily., Disp: , Rfl:    Oxycodone  HCl 10 MG TABS, Take 10 mg by mouth 3 (three) times daily., Disp: , Rfl:    rosuvastatin  (CRESTOR ) 10 MG tablet, Take 10 mg by mouth at bedtime., Disp: , Rfl:    umeclidinium-vilanterol (ANORO ELLIPTA ) 62.5-25 MCG/INH AEPB, Inhale 1 puff into the lungs daily., Disp: 60 each, Rfl: 0  Vitals   Vitals:   2024-08-01 0728  BP: (!) 157/80  Pulse: 72  Resp: 15  SpO2: 98%    There is no height or weight on file to calculate BMI.   Physical Exam   Constitutional: Appears well-developed and well-nourished.  Psych: Affect appropriate to situation.  Eyes: No scleral injection.  HENT: No OP obstruction.  Head: Normocephalic.  Cardiovascular: Normal rate and regular rhythm.  Respiratory: Effort normal, non-labored breathing.  GI: Soft.  No distension. There is no tenderness.  Skin: WDI.   Neurologic Examination   Mental Status -  Level of arousal and orientation to time, place, and person were intact. Language including expression, naming, repetition, comprehension was assessed and found intact. Attention span and concentration were normal. Recent and remote memory were intact. Fund of Knowledge was assessed and was intact.  Cranial Nerves II - XII - II - Visual field intact OU. III, IV, VI - Extraocular  movements intact. V - Facial sensation intact bilaterally. VII - flattening right nasolabial fold  VIII - Hearing & vestibular intact bilaterally . X - Palate elevates symmetrically . XI - Chin turning & shoulder shrug intact bilaterally . XII - Tongue protrusion intact.  Motor Strength - The patient's strength was normal in right arm and right leg.  Left arm and left leg with drift  Bulk was normal and fasciculations were absent .   Motor Tone - Muscle tone was assessed at the neck and appendages and was normal. Sensory - decreased on left   Coordination - The patient had normal movements in the hands and feet with no ataxia or dysmetria.  Tremor was absent .  Gait and Station - deferred.  Labs/Imaging/Neurodiagnostic studies   CBC:  Recent Labs  Lab 08-01-2024 0725  WBC 7.3  NEUTROABS 4.0  HGB 12.6  HCT 38.8  MCV 94.2  PLT 246   Basic Metabolic Panel:  Lab Results  Component Value Date   NA 143 02/15/2024   K 3.7 02/15/2024   CO2 27 02/15/2024   GLUCOSE 97 02/15/2024   BUN 9 02/15/2024   CREATININE 1.10 (H) 02/15/2024   CALCIUM  9.7 02/15/2024   GFRNONAA 52 (L) 02/15/2024   GFRAA >60 04/01/2020   Lipid Panel:  Lab Results  Component Value Date   LDLCALC 101 (H) 02/23/2021   HgbA1c:  Lab Results  Component Value Date   HGBA1C 5.4 02/23/2021   Urine Drug Screen:     Component Value Date/Time   LABOPIA POSITIVE (A) 08/15/2021 1155   COCAINSCRNUR NONE DETECTED 08/15/2021 1155   LABBENZ POSITIVE (A) 08/15/2021 1155   AMPHETMU NONE DETECTED 08/15/2021 1155   THCU NONE DETECTED 08/15/2021 1155   LABBARB NONE DETECTED 08/15/2021 1155    Alcohol Level     Component Value Date/Time   ETH <10  08/15/2021 1220   INR  Lab Results  Component Value Date   INR 1.0 08/15/2021   APTT  Lab Results  Component Value Date   APTT 28 08/15/2021   AED levels: No results found for: PHENYTOIN, ZONISAMIDE, LAMOTRIGINE, LEVETIRACETA  CT Head without  contrast(Personally reviewed): No acute process  CT angio Head and Neck with contrast(Personally reviewed): No LVO    ASSESSMENT   Tonya [name] is a 78 y.o. female  hx of lung cancer, HTN, GERD, prior ICH who presents via EMS for acute onset of shooting pain in her right leg, left leg weakness and pressure pain under left breast. LKW 0630. Patient endorses having a headache since Monday. NIHSS 4  RECOMMENDATIONS  - obtain MRI brain. If positive for stroke will pursue stroke workup  - Consider infectious workup  ______________________________________________________________________    Signed, Karna DELENA Geralds, NP [facility]   Attending Neurohospitalist Addendum Patient seen and examined with APP/Resident. Agree with the history and physical as documented above. Agree with the plan as documented, which I helped formulate. I have edited the note above to reflect my full findings and recommendations. I have independently reviewed the chart, obtained history, review of systems and examined the patient.I have personally reviewed pertinent head/neck/spine imaging (CT/MRI). Please feel free to call with any questions.  MRI brain no acute process personal review. Patient's sx improved and she was discharged from ED. Presentation (esp with pain) not c/w TIA. No further ED or inpatient neurologic workup recommended at this time.  -- Elida Ross, MD [facility] [phone]  If 7pm- 7am, please page neurology on call as listed in AMION.

## 2024-07-26 NOTE — ED Provider Notes (Signed)
 [place] EMERGENCY DEPARTMENT AT [facility] Provider Note   CSN: [id number] Arrival date & time: 07/26/24  9279  An emergency department physician performed an initial assessment on this suspected stroke patient at 0720.  History Chief Complaint  Patient presents with   Code Stroke    HPI: Tonya [name] is a 78 y.o. female with history pertinent for prior ICH, complex migraines, prior CVA, HTN, GERD, lung cancer who presents complaining of neurologic deficits. Patient arrived via EMS from home.  History provided by patient and EMS.  No interpreter required during this encounter.  Patient reports that she has had 3 days of headache since 11/17.  Woke in her normal state of health at 630, however thereafter sharp/shooting pain in her right lower extremity, as well as chest pain/pressure which radiated into her left upper extremity.  Also reports weakness in her left leg.  Denies fever, chills, shortness of breath, nausea, vomiting, diarrhea.  Patient's recorded medical, surgical, social, medication list and allergies were reviewed in the Snapshot window as part of the initial history.   Prior to Admission medications   Medication Sig Start Date End Date Taking? Authorizing Provider  acetaminophen  (TYLENOL ) 325 MG tablet Take 1-2 tablets (325-650 mg total) by mouth every 4 (four) hours as needed for mild pain. 03/25/21   Love, Sharlet RAMAN, PA-C  albuterol  (VENTOLIN  HFA) 108 (90 Base) MCG/ACT inhaler Inhale 2 puffs into the lungs every 6 (six) hours as needed for wheezing or shortness of breath. 01/15/21   Brenna Adine CROME, DO  alendronate (FOSAMAX) 70 MG tablet Take 70 mg by mouth every Saturday. 07/04/20   [provider]  ALPRAZolam  (XANAX ) 1 MG tablet Take 1 mg by mouth at bedtime as needed for sleep.    [provider]  Calcium  Carbonate-Vitamin D (CALCIUM  + D PO) Take 1 tablet by mouth 2 (two) times daily.    [provider]   clotrimazole-betamethasone (LOTRISONE) cream Apply topically.    [provider]  fluticasone (FLONASE) 50 MCG/ACT nasal spray Place 2 sprays into both nostrils daily. 09/02/23   [provider]  folic acid  (FOLVITE ) 1 MG tablet TAKE 1 TABLET(1 MG) BY MOUTH DAILY 03/07/21   Sherrod Sherrod, MD  gabapentin  (NEURONTIN ) 300 MG capsule TAKE 1 CAPSULE(300 MG) BY MOUTH THREE TIMES DAILY 11/02/22   Kirsteins, Prentice BRAVO, MD  hydrOXYzine (ATARAX) 25 MG tablet Take by mouth.    [provider]  lidocaine  (LIDODERM ) 5 % Place 3 patches onto the skin daily. Apply at 8 am and remove at 8 pm daily. Patient taking differently: Place 3 patches onto the skin as needed. Apply at 8 am and remove at 8 pm daily. 03/25/21   Love, Sharlet RAMAN, PA-C  NARCAN 4 MG/0.1ML LIQD nasal spray kit 1 spray once. 05/17/21   [provider]  omeprazole (PRILOSEC) 40 MG capsule Take 40 mg by mouth daily.    [provider]  Oxycodone  HCl 10 MG TABS Take 10 mg by mouth 3 (three) times daily.    [provider]  rosuvastatin  (CRESTOR ) 10 MG tablet Take 10 mg by mouth at bedtime. 10/25/20   [provider]  umeclidinium-vilanterol (ANORO ELLIPTA ) 62.5-25 MCG/INH AEPB Inhale 1 puff into the lungs daily. 03/26/21   Maurice Sharlet RAMAN, PA-C     Allergies: Other, Tizanidine , and Penicillins   Review of Systems   ROS as per HPI  Physical Exam Updated Vital Signs BP 130/72   Pulse 66  Temp 98 F (36.7 C) (Oral)   Resp 16   SpO2 100%  Physical Exam Vitals and nursing note reviewed.  Constitutional:      General: She is not in acute distress.    Appearance: She is well-developed.  HENT:     Head: Normocephalic and atraumatic.  Eyes:     Conjunctiva/sclera: Conjunctivae normal.  Cardiovascular:     Rate and Rhythm: Normal rate and regular rhythm.     Heart sounds: No murmur heard. Pulmonary:     Effort: Pulmonary effort is normal. No respiratory distress.     Breath  sounds: Normal breath sounds.  Abdominal:     Palpations: Abdomen is soft.     Tenderness: There is no abdominal tenderness.  Musculoskeletal:        General: No swelling.     Cervical back: Neck supple.  Skin:    General: Skin is warm and dry.     Capillary Refill: Capillary refill takes less than 2 seconds.  Neurological:     Mental Status: She is alert.     Cranial Nerves: Cranial nerve deficit (Flattening of the left nasolabial fold) present.     Sensory: Sensory deficit (Sensory extinction in the left lower extremity) present.     Motor: Weakness (Slight weakness in the left hemibody) present.  Psychiatric:        Mood and Affect: Mood normal.     ED Course/ Medical Decision Making/ A&P    Procedures .Critical Care  Performed by: Rogelia Jerilynn RAMAN, MD Authorized by: Rogelia Jerilynn RAMAN, MD   Critical care provider statement:    Critical care time (minutes):  30   Critical care was necessary to treat or prevent imminent or life-threatening deterioration of the following conditions:  CNS failure or compromise   Critical care was time spent personally by me on the following activities:  Development of treatment plan with patient or surrogate, discussions with consultants, evaluation of patient's response to treatment, examination of patient, ordering and review of laboratory studies, ordering and review of radiographic studies, ordering and performing treatments and interventions, pulse oximetry, re-evaluation of patient's condition and review of old charts   I assumed direction of critical care for this patient from another provider in my specialty: no      Medications Ordered in ED Medications  sodium chloride  flush (NS) 0.9 % injection 3 mL (has no administration in time range)  lidocaine  (LIDODERM ) 5 % 1 patch (1 patch Transdermal Patch Applied 07/26/24 1137)  iohexol  (OMNIPAQUE ) 350 MG/ML injection 100 mL (100 mLs Intravenous Contrast Given 07/26/24 0740)  lactated  ringers  bolus 1,000 mL (0 mLs Intravenous Stopped 07/26/24 1346)  ketorolac  (TORADOL ) 15 MG/ML injection 15 mg (15 mg Intravenous Given 07/26/24 1131)  acetaminophen  (TYLENOL ) tablet 1,000 mg (1,000 mg Oral Given 07/26/24 1133)  prochlorperazine  (COMPAZINE ) injection 5 mg (5 mg Intravenous Given 07/26/24 1132)    Medical Decision Making:   MATHA MASSE is a 78 y.o. female who presents for neurologic deficits as per above.  Physical exam is pertinent for left hemibody weakness, sensory extinction, flattening of nasolabial fold.   The differential includes but is not limited to stroke, ICH, dissection, complex migraine.  Independent historian: EMS  External data reviewed: No pertinent external data  Initial Plan:  Screening labs including CBC and Metabolic panel to evaluate for infectious or metabolic etiology of disease.  Coags, ethanol given code stroke Urinalysis with reflex culture ordered to evaluate for UTI or relevant urologic/nephrologic  pathology.  CT code stroke imaging to evaluate for stroke, ICH CT dissection study EKG and serial troponin to evaluate for cardiac pathology. Objective evaluation as below reviewed   Labs: Ordered, Independent interpretation, and Details: Ethanol undetectable, glucose WNL.  CBC without leukocytosis, anemia, thrombocytopenia.  Coags reassuring. CMP without AKI, emergent electrolyte derangement, emergent LFT abnormality.  Troponin WNL at 7.  UA without UTI  Radiology: Ordered, Independent interpretation, Details: Personally reviewed CT of the head, do not appreciate ICH, displaced fracture, MLS, mass lesion, loss of gray/white matter differentiation.  CT dissection and CTA head and neck without large dissection or overt LVO.  MRI without focal abnormal intensity, and All images reviewed independently.  Agree with radiology report at this time.   MR BRAIN WO CONTRAST Result Date: 07/26/2024 EXAM: MRI BRAIN WITHOUT CONTRAST 07/26/2024  09:08:46 AM TECHNIQUE: Multiplanar multisequence MRI of the head/brain was performed without the administration of intravenous contrast. COMPARISON: Head CT 07/26/2024 and MRI 08/15/2021. CLINICAL HISTORY: Stroke, follow up. FINDINGS: LIMITATIONS: The examination is mildly to moderately motion degraded. BRAIN AND VENTRICLES: No acute infarct, mass, midline shift, hydrocephalus, or extra-axial fluid collection is identified. A moderate sized region of encephalomalacia with hemosiderin staining is again noted in the right parietal lobe related to a remote hemorrhage, and there is associated ex vacuo dilatation of the atrium of the right lateral ventricle. There is also unchanged mild superficial siderosis over the right frontal convexity consistent with remote subarachnoid hemorrhage. There is mild cerebral atrophy. T2 hyperintensities elsewhere in the cerebral white matter are similar to the prior MRI and are nonspecific but compatible with minimal chronic small vessel ischemic disease. Major intracranial vascular flow voids are preserved. ORBITS: No acute abnormality. SINUSES AND MASTOIDS: Small right mastoid effusion. Clear paranasal sinuses. BONES AND SOFT TISSUES: Normal marrow signal. No acute soft tissue abnormality. IMPRESSION: 1. No acute intracranial abnormality. 2. Right parietal encephalomalacia. Electronically signed by: Dasie Hamburg MD 07/26/2024 09:55 AM EST RP Workstation: [id number]   CT ANGIO CODE STROKE CHEST/ABD/PEL FOR DISSECTION W &/OR WO CONTRAST Result Date: 07/26/2024 EXAM: CTA CHEST, ABDOMEN AND PELVIS WITHOUT AND WITH CONTRAST 07/26/2024 08:00:00 AM TECHNIQUE: CTA of the chest was performed without and with the administration of intravenous contrast. CTA of the abdomen and pelvis was performed without and with the administration of intravenous contrast. Multiplanar reformatted images are provided for review. MIP images are provided for review. Automated exposure control, iterative  reconstruction, and/or weight based adjustment of the mA/kV was utilized to reduce the radiation dose to as low as reasonably achievable. COMPARISON: Chest CT with contrast 02/15/2024. CT abdomen and pelvis 02/18/2015. Comparison chest CT 02/13/2022. CLINICAL HISTORY: 78 year old female with acute neuro deficit, stroke suspected, and transient ischemic attack (TIA). FINDINGS: VASCULATURE: AORTA: Mildly tortuous and moderately atherosclerotic thoracic aorta, negative for thoracic aortic dissection or aneurysm. Abdominal aortoiliac calcified atherosclerosis is moderate. Tortuous abdominal aorta, negative for abdominal aortic aneurysm or dissection. major arterial branches in the abdomen and pelvis are patent . Major visible pulmonary arteries appear patent and stable. CHEST: MEDIASTINUM: No mediastinal lymphadenopathy. Heart size is stable with suggestion of chronic left atrial enlargement. No pericardial effusion. No obvious calcified coronary artery atherosclerosis. The heart and pericardium demonstrate no acute abnormality. LUNGS AND PLEURA: Chronic right upper lobectomy postoperative changes in the right lung and at the right hilum are stable with chronic lung and soft tissue thickening but no progressive or suspicious features. Left upper lobe bronchoscopic biopsy type surgical clips and spiculated left apical lung nodule appears  stable (series 6 image 22) this does not appear significantly changed from comparison chest CT 02/13/2022, favoring benign etiology (series 7 image 26 of that exam). Mild respiratory motion. Evidence of underlying centrilobular emphysema. No pleural effusion or acute lung opacity. Residual major airways are patent. THORACIC BONES AND SOFT TISSUES: Thoracic spine kyphoscoliosis associated degenerative thoracic interbody and posterior elements of ankylosis. Chronic right upper lobectomy postoperative changes in the right lung and at the right hilum are stable with chronic lung and soft  tissue thickening but no progressive or suspicious features. No acute or suspicious osseous abnormality in the chest. ABDOMEN AND PELVIS: LIVER: Liver enhancement is stable since 2016, with minimal limits. GALLBLADDER AND BILE DUCTS: Abnormally dilated intra and extrahepatic bile ducts. The CBD is dilated 12 to 13 mm diameter and there is an abrupt termination of the dilated bile duct near the papilla on series 13 image 87. Gallbladder appears distended. No pericholecystic inflammation. No discrete biliary stone by CT. SPLEEN: The spleen is unremarkable. PANCREAS: Stable pancreas since 2016, nondilated main pancreatic duct. 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 KIDNEYS, URETERS AND BLADDER: Chronic and benign left renal mid pole cyst (no follow up imaging recommended). No stones in the kidneys or ureters. No hydronephrosis. No perinephric or periureteral stranding. Decompressed urinary bladder. GI AND BOWEL: Redundant but decompressed sigmoid colon in the pelvis. No abnormally dilated or inflamed large bowel. Appendix not identified, diminutive or absent. Nondilated small bowel. Decompressed stomach and duodenum. Chronic rectus muscle diastasis at the umbilicus, no overt ventral abdominal hernia. No pneumoperitoneum. No free air. REPRODUCTIVE: Chronically absent uterus, diminutive or absent ovaries. PERITONEUM AND RETROPERITONEUM: No ascites or free air. LYMPH NODES: No lymphadenopathy. ABDOMINAL BONES AND SOFT TISSUES: Chronic L4 lumbar vertebral body heterogeneity appears related to benign hemangioma. Chronic pubic symphysis degeneration. No acute or suspicious osseous lesion identified. Chronic rectus muscle diastasis at the umbilicus, no overt ventral abdominal hernia. IMPRESSION: 1. Abnormally dilated intra- and extrahepatic bile ducts with abrupt termination of the CBD near the papilla. ERCP would likely be the  most valuable next step. 2. Aortic atherosclerosis and tortuosity.negative for aortic aneurysm or dissection. 3. No other acute or inflammatory process identified. Stable post treatment appearance of the lungs. Electronically signed by: Helayne Hurst MD 07/26/2024 08:35 AM EST RP Workstation: [id number]   CT ANGIO HEAD NECK W WO CM (CODE STROKE) Result Date: 07/26/2024 EXAM: CTA HEAD AND NECK WITH AND WITHOUT 07/26/2024 08:00:00 AM TECHNIQUE: CTA of the head and neck was performed with and without the administration of intravenous contrast. Multiplanar 2D and/or 3D reformatted images are provided for review. Automated exposure control, iterative reconstruction, and/or weight based adjustment of the mA/kV was utilized to reduce the radiation dose to as low as reasonably achievable. Stenosis of the internal carotid arteries measured using NASCET criteria. COMPARISON: Head CT reported separately today. CTA chest reported separately today. CLINICAL HISTORY: 78 year old female. Neuro deficit, acute, stroke suspected. Code straight presentation. FINDINGS: CTA NECK: AORTIC ARCH AND ARCH VESSELS: Calcified aortic arch. 3 vessel arch configuration. No dissection or arterial injury. No significant stenosis of the brachiocephalic or subclavian arteries. Proximal left subclavian artery atherosclerosis without stenosis. CERVICAL CAROTID ARTERIES: Tortuous right CCA and right ICA with minimal atherosclerosis, no stenosis. Calcified plaque at the left carotid bifurcation and left ICA bulb. No associated stenosis. Highly tortuous right ICA just below the skull base. Patent right ICA siphon with mild calcified  plaque and no stenosis. No dissection or arterial injury. No hemodynamically significant stenosis by NASCET criteria. CERVICAL VERTEBRAL ARTERIES: Normal right vertebral artery origin. Patent right vertebral artery to the vertebrobasilar junction without stenosis. Mild V4 segment atherosclerosis. Normal right PICA origin.  Nondominant left vertebral artery. Proximal left vertebral artery venous contrast reflux. No left vertebral artery stenosis. No dissection or arterial injury. SOFT TISSUES: No acute abnormality. BONES: Absent and carious dentition. Mild for age spine degeneration. 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 CTA HEAD: ANTERIOR CIRCULATION: Normal right posterior communicating artery origin. Normal MCA and ACA origins. Normal anterior communicating artery. No significant stenosis of the internal carotid arteries. No significant stenosis of the anterior cerebral arteries. No significant stenosis of the middle cerebral arteries. No aneurysm. POSTERIOR CIRCULATION: Patent basilar artery is tortuous, mildly irregular. No basilar stenosis. Normal SCA and left PCA origins. Fetal type right PCA. Left posterior communicating artery diminutive or absent. No significant stenosis of the posterior cerebral arteries. No significant stenosis of the basilar artery. No significant stenosis of the vertebral arteries. No aneurysm. OTHER: Necklace artifact. IMPRESSION: 1. Negative for large vessel occlusion. 2. Arterial tortuosity in the head and neck with minimal atherosclerosis for age. No arterial stenosis. 3. CTA Chest reported separately. Electronically signed by: Helayne Hurst MD 07/26/2024 08:19 AM EST RP Workstation: [id number]   CT HEAD CODE STROKE WO CONTRAST Result Date: 07/26/2024 EXAM: CT HEAD WITHOUT 08/16/2022 TECHNIQUE: CT of the head was performed without the administration of intravenous contrast. Automated exposure control, iterative reconstruction, and/or weight based adjustment of the mA/kV was utilized to reduce the radiation dose to as low as reasonably achievable. COMPARISON: Brain MRI 08/15/2021. Head CT 08/15/2021. CLINICAL HISTORY: 78 year old female. FINDINGS: BRAIN AND VENTRICLES: No acute intracranial hemorrhage. No mass effect or midline shift. No extra-axial fluid collection. No  evidence of acute infarct. No hydrocephalus. Chronic superior peri-Rolandic encephalomalacia on the right is stable. Stable underlying brain volume. Mild ex vacuo enlargement of the right lateral ventricle. Calcified atherosclerosis at the skull base. No suspicious intracranial vascular hyperdensity. Elsewhere gray white differentiation is stable and normal for age. No gaze deviation. ORBITS: No acute abnormality. SINUSES AND MASTOIDS: Visible paranasal sinuses, middle ears and mastoids remain well aerated. SOFT TISSUES AND SKULL: No acute skull fracture. No acute soft tissue abnormality. 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 [place] stroke program early CT score (aspects) ----- Ganglionic (caudate, ic, lentiform nucleus, insula, M1-m3): 7 Supraganglionic (m4-m6): 3 Total: 10 IMPRESSION: 1. No acute intracranial abnormality.  ASPECTS 10. 2. Stable chronic right superior peri-Rolandic encephalomalacia. 3. These results were communicated to Dr. Matthews at 07:34 on 07/26/2024 by text page via the [facility] messaging system. Electronically signed by: Helayne Hurst MD 07/26/2024 07:35 AM EST RP Workstation: [id number]    EKG/Medicine tests: Ordered and Independent interpretation EKG Interpretation: Sinus rhythm Nonspecific T abnormalities, diffuse leads Confirmed by Rogelia Satterfield ([id number]) on 07/26/2024 12:18:19 PM                Interventions: Lidocaine  patch  See the EMR for full details regarding lab and imaging results.  Patient presents as a code stroke which was activated prior to arrival, neurology evaluated the patient contemporaneously upon arrival to the ED.  Patient protecting her airway, therefore proceeded directly to the CT scanner.  Given her chest pain and neurosensory deficits on the left side but pain in the right lower extremity, do feel that patient warrants CT dissection study in addition to CT code  stroke imaging.  Controlled imaging does not reveal acute pathology,  therefore MRI ordered by neurology.  MRI does not reveal evidence of stroke, labs reassuring.  On reassessment, patient reports continued headache, however reports that her neurologic symptoms have resolved, reports that symptoms are currently isolated to only a right-sided headache.  Will administer migraine cocktail with Compazine , LR bolus, Toradol , Tylenol  and reassess.  Notably, patient does have abnormally dilated intra- and extrahepatic bile ducts with abrupt termination of the CBD near the papilla. ERCP would likely be the most valuable next step.  Patient without elevation of LFTs on CMP, nor abdominal symptoms, therefore feel that patient is most appropriate for referral to gastroenterology.  Patient did request a lidocaine  patch for her chronic sciatica that she states was worsened by the uncomfortable ED bed.  On reevaluation after headache cocktail, patient reports resolution of all of her symptoms, given she continues to be free of neurologic symptoms, and headache is resolved, feel that patient is appropriate for discharge and outpatient follow-up.  Presentation is most consistent with acute life/limb-threatening illness  Discussion of management or test interpretations with external provider(s): Dr. Matthews, neurology  Risk Drugs:OTC drugs and Prescription drug management Critical Care: 30 minutes  Disposition: DISCHARGE: I believe that the patient is safe for discharge home with outpatient follow-up. Patient was informed of all pertinent physical exam, laboratory, and imaging findings. Patient's suspected etiology of their symptom presentation was discussed with the patient and all questions were answered. We discussed following up with PCP. I provided thorough ED return precautions. The patient feels safe and comfortable with this plan.  MDM generated using voice dictation software and may contain dictation errors.  Please contact me for any clarification or with any  questions.  Clinical Impression:  1. Acute nonintractable headache, unspecified headache type   2. Neurological deficit present      Discharge   Final Clinical Impression(s) / ED Diagnoses Final diagnoses:  Acute nonintractable headache, unspecified headache type  Neurological deficit present    Rx / DC Orders ED Discharge Orders          Ordered    Ambulatory referral to Gastroenterology        07/26/24 1054             Rogelia Jerilynn RAMAN, MD 07/26/24 1531

## 2024-07-26 NOTE — ED Triage Notes (Addendum)
 Pt BIB [facility] as a Code Stroke.  Pt has hx of ICH in 2022.  Pt woke up normal at 6:30 feeling fine, then had suddnen pain in right leg. After that she developed weakness and decreased sensation in her left arm and leg. She also endorsed L. Side HA beginning on Monday. EMS endorsed ataxic gait.  Pt has a significant tremor that neurology is aware of.   160/86 96% HR 80 CBG 111

## 2024-07-26 NOTE — Discharge Instructions (Addendum)
 Tonya [name]  Thank you for allowing us  to take care of you today.  You came to the Emergency Department today because you had a headache and difficulty moving your body. You have no evidence of stroke on your images and your labs look good. Your CT did show that the tubes in your liver that carry your digestive fluids abnormally dilated intra- and extrahepatic bile ducts with abrupt termination of the CBD near the papilla. ERCP would likely be the most valuable next step. were a bit big so we are reffering your to a GI doctor. To-Do: 1. Please follow-up with your primary doctor within 1 - 2 weeks / as soon as possible.   Please return to the Emergency Department or call 911 if you experience have worsening of your symptoms, or do not get better, your eyes or skin turn yellow, chest pain, shortness of breath, severe or significantly worsening pain, high fever, severe confusion, pass out or have any reason to think that you need emergency medical care.   We hope you feel better soon.   Mitzie Later, MD Department of Emergency Medicine [facility]

## 2024-07-26 NOTE — ED Notes (Signed)
 Patient transported to MRI

## 2024-07-26 NOTE — Code Documentation (Addendum)
 Stroke Response Nurse Documentation  Code Documentation  Tonya [name] is a 78 y.o. female arriving to [facility]  via [facility] on 07/26/2024 with past medical hx of ICH and cancer. On No antithrombotic. Code stroke was activated by [facility].   Patient from home, where she was LKW at 0630 and now complaining of headache, and bilateral weakness, with more weakness on the left.  Stroke team at the bedside on patient arrival. Labs drawn and patient cleared for CT by Dr. Rogelia and Dr. Emil. Patient to CT with team. NIHSS 4, see documentation for details and code stroke times. Patient with right facial droop, left arm weakness, left leg weakness, and left decreased sensation on exam. T  The following imaging was completed:  CT Head and CTA.   Patient is not a candidate for IV Thrombolytic due to prior history of ICH. Patient is not a candidate for IR.  Care Plan: Q2hr NIHSS/VS, with metabolic panel work up.   Process Delays Noted: IV access-- had to use ultrasound to establish access before CTA  Bedside handoff with ED RN Josh.    Yaser Harvill M Arshad Oberholzer  Stroke Response RN

## 2024-08-15 ENCOUNTER — Ambulatory Visit (HOSPITAL_COMMUNITY)
Admission: RE | Admit: 2024-08-15 | Discharge: 2024-08-15 | Disposition: A | Source: Ambulatory Visit | Attending: Internal Medicine | Admitting: Internal Medicine

## 2024-08-15 ENCOUNTER — Inpatient Hospital Stay: Attending: Internal Medicine

## 2024-08-15 DIAGNOSIS — Z08 Encounter for follow-up examination after completed treatment for malignant neoplasm: Secondary | ICD-10-CM | POA: Insufficient documentation

## 2024-08-15 DIAGNOSIS — Z85118 Personal history of other malignant neoplasm of bronchus and lung: Secondary | ICD-10-CM | POA: Insufficient documentation

## 2024-08-15 DIAGNOSIS — C3490 Malignant neoplasm of unspecified part of unspecified bronchus or lung: Secondary | ICD-10-CM

## 2024-08-15 DIAGNOSIS — R7989 Other specified abnormal findings of blood chemistry: Secondary | ICD-10-CM | POA: Diagnosis not present

## 2024-08-15 LAB — CBC WITH DIFFERENTIAL (CANCER CENTER ONLY)
Abs Immature Granulocytes: 0.01 K/uL (ref 0.00–0.07)
Basophils Absolute: 0.1 K/uL (ref 0.0–0.1)
Basophils Relative: 1 %
Eosinophils Absolute: 0.5 K/uL (ref 0.0–0.5)
Eosinophils Relative: 9 %
HCT: 37 % (ref 36.0–46.0)
Hemoglobin: 12.2 g/dL (ref 12.0–15.0)
Immature Granulocytes: 0 %
Lymphocytes Relative: 34 %
Lymphs Abs: 2 K/uL (ref 0.7–4.0)
MCH: 30.8 pg (ref 26.0–34.0)
MCHC: 33 g/dL (ref 30.0–36.0)
MCV: 93.4 fL (ref 80.0–100.0)
Monocytes Absolute: 0.6 K/uL (ref 0.1–1.0)
Monocytes Relative: 11 %
Neutro Abs: 2.7 K/uL (ref 1.7–7.7)
Neutrophils Relative %: 45 %
Platelet Count: 256 K/uL (ref 150–400)
RBC: 3.96 MIL/uL (ref 3.87–5.11)
RDW: 13.1 % (ref 11.5–15.5)
WBC Count: 5.9 K/uL (ref 4.0–10.5)
nRBC: 0 % (ref 0.0–0.2)

## 2024-08-15 LAB — CMP (CANCER CENTER ONLY)
ALT: 13 U/L (ref 0–44)
AST: 21 U/L (ref 15–41)
Albumin: 4 g/dL (ref 3.5–5.0)
Alkaline Phosphatase: 77 U/L (ref 38–126)
Anion gap: 11 (ref 5–15)
BUN: 7 mg/dL — ABNORMAL LOW (ref 8–23)
CO2: 24 mmol/L (ref 22–32)
Calcium: 10.1 mg/dL (ref 8.9–10.3)
Chloride: 108 mmol/L (ref 98–111)
Creatinine: 1.02 mg/dL — ABNORMAL HIGH (ref 0.44–1.00)
GFR, Estimated: 57 mL/min — ABNORMAL LOW (ref >60)
Glucose, Bld: 90 mg/dL (ref 70–99)
Potassium: 3.7 mmol/L (ref 3.5–5.1)
Sodium: 142 mmol/L (ref 135–145)
Total Bilirubin: 0.5 mg/dL (ref 0.0–1.2)
Total Protein: 7.2 g/dL (ref 6.5–8.1)

## 2024-08-15 MED ORDER — SODIUM CHLORIDE (PF) 0.9 % IJ SOLN
INTRAMUSCULAR | Status: AC
  Filled 2024-08-15: qty 50

## 2024-08-15 MED ORDER — IOHEXOL 300 MG/ML  SOLN
75.0000 mL | Freq: Once | INTRAMUSCULAR | Status: AC | PRN
  Administered 2024-08-15: 75 mL via INTRAVENOUS

## 2024-08-22 ENCOUNTER — Inpatient Hospital Stay: Admitting: Internal Medicine

## 2024-08-22 VITALS — BP 139/73 | HR 77 | Temp 97.6°F | Resp 17 | Ht 64.0 in | Wt 172.0 lb

## 2024-08-22 DIAGNOSIS — C3490 Malignant neoplasm of unspecified part of unspecified bronchus or lung: Secondary | ICD-10-CM

## 2024-08-22 DIAGNOSIS — Z08 Encounter for follow-up examination after completed treatment for malignant neoplasm: Secondary | ICD-10-CM | POA: Diagnosis not present

## 2024-08-22 NOTE — Progress Notes (Signed)
 [facility] [phone]   [phone]  OFFICE PROGRESS NOTE  Ilah Crigler, MD [address] [place] [address]  DIAGNOSIS: Stage IIB (T3, N0, M0) non-small cell lung cancer, adenocarcinoma involving the right upper lobe. The patient also has evidence for adenocarcinoma of the left upper lobe lung nodule as well as subcentimeter pulmonary nodules in the right lung concerning for stage IV lung cancer with multifocal disease bilaterally.  This was diagnosed in December  2021  Biomarker Findings  Microsatellite status - MS-Equivocal ? Tumor Mutational Burden - Cannot Be Determined Genomic Findings For a complete list of the genes assayed, please refer to the Appendix. MET D1002fs*2 AXL T343M TP53 I254V 7 Disease relevant genes with no reportable alterations: ALK, BRAF, EGFR, ERBB2, KRAS, RET, ROS1  PDL1 Expression 20 %.  PRIOR THERAPY:  1) status post right upper lobectomy with lymph node sampling on August 26, 2020 under the care of Dr. Shyrl. 2) Adjuvant systemic chemotherapy with cisplatin  75 mg/M2 and Alimta  500/M2 every 3 weeks. Last dose 01/17/21.  Status post 4 cycles   CURRENT THERAPY: Observation   INTERVAL HISTORY: Tonya [name] 78 y.o. female returns to the clinic today for 73-month follow-up visit. Discussed the use of AI scribe software for clinical note transcription with the patient, who gave verbal consent to proceed.  History of Present Illness Tonya [name] is a 78 year old female with stage IIB left upper lobe lung adenocarcinoma, status post lobectomy and adjuvant chemotherapy, who presents for routine surveillance and restaging.  She was diagnosed with stage IIB left upper lobe lung adenocarcinoma in December 2021 and subsequently underwent left upper lobectomy followed by four cycles of adjuvant cisplatin  and pemetrexed , completed in March 2022. She has been under surveillance since  completion of therapy.  She remains asymptomatic from a pulmonary perspective, denying chest pain, dyspnea, nausea, vomiting, or diarrhea. She reports persistent discomfort at the surgical incision site, managed with regular use of lidocaine  patches.  Serial chest CT imaging demonstrates a stable 6 mm nodule in the left upper lobe without interval change.  Laboratory studies are notable for mildly elevated creatinine, which is asymptomatic. 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  SABRA    MEDICAL HISTORY: Past Medical History:  Diagnosis Date   Adenoma of left adrenal gland    Cancer (HCC)    Lung   Colon polyps    hyperplastic   Complication of anesthesia    Very emotional and cries after anesthesia   GERD (gastroesophageal reflux disease)    High blood pressure 03/25/2021   Kidney stone    Liver lesion    Microhematuria    Stroke (HCC)     ALLERGIES:  is allergic to other, tizanidine , and penicillins.  MEDICATIONS:  Current Outpatient Medications  Medication Sig Dispense Refill   acetaminophen  (TYLENOL ) 325 MG tablet Take 1-2 tablets (325-650 mg total) by mouth every 4 (four) hours as needed for mild pain.     albuterol  (VENTOLIN  HFA) 108 (90 Base) MCG/ACT inhaler Inhale 2 puffs into the lungs every 6 (six) hours as needed for wheezing or shortness of breath. 8 g 2   alendronate (FOSAMAX) 70 MG tablet Take 70 mg by mouth every Saturday.     ALPRAZolam  (XANAX ) 1 MG tablet Take 1 mg by mouth at bedtime as needed for sleep.     Calcium  Carbonate-Vitamin D (CALCIUM  + D  PO) Take 1 tablet by mouth 2 (two) times daily.     clotrimazole-betamethasone (LOTRISONE) cream Apply topically.     fluticasone (FLONASE) 50 MCG/ACT nasal spray Place 2 sprays into both nostrils daily.     folic acid  (FOLVITE ) 1 MG tablet TAKE 1 TABLET(1 MG) BY MOUTH DAILY 30 tablet 4   gabapentin  (NEURONTIN ) 300  MG capsule TAKE 1 CAPSULE(300 MG) BY MOUTH THREE TIMES DAILY 90 capsule 0   hydrOXYzine (ATARAX) 25 MG tablet Take by mouth.     lidocaine  (LIDODERM ) 5 % Place 3 patches onto the skin daily. Apply at 8 am and remove at 8 pm daily. (Patient taking differently: Place 3 patches onto the skin as needed. Apply at 8 am and remove at 8 pm daily.) 90 patch 0   NARCAN 4 MG/0.1ML LIQD nasal spray kit 1 spray once.     omeprazole (PRILOSEC) 40 MG capsule Take 40 mg by mouth daily.     Oxycodone  HCl 10 MG TABS Take 10 mg by mouth 3 (three) times daily.     rosuvastatin  (CRESTOR ) 10 MG tablet Take 10 mg by mouth at bedtime.     umeclidinium-vilanterol (ANORO ELLIPTA ) 62.5-25 MCG/INH AEPB Inhale 1 puff into the lungs daily. 60 each 0   No current facility-administered medications for this visit.    SURGICAL HISTORY:  Past Surgical History:  Procedure Laterality Date   ABDOMINAL HYSTERECTOMY     APPENDECTOMY     BRONCHIAL BIOPSY  07/18/2020   Procedure: BRONCHIAL BIOPSIES;  Surgeon: Brenna Adine CROME, DO;  Location: [facility];  Service: Pulmonary;;   BRONCHIAL BRUSHINGS  07/18/2020   Procedure: BRONCHIAL BRUSHINGS;  Surgeon: Brenna Adine CROME, DO;  Location: [facility];  Service: Pulmonary;;   BRONCHIAL NEEDLE ASPIRATION BIOPSY  07/18/2020   Procedure: BRONCHIAL NEEDLE ASPIRATION BIOPSIES;  Surgeon: Brenna Adine CROME, DO;  Location: [facility];  Service: Pulmonary;;   BRONCHIAL WASHINGS  07/18/2020   Procedure: BRONCHIAL WASHINGS;  Surgeon: Brenna Adine CROME, DO;  Location: [facility];  Service: Pulmonary;;   FIDUCIAL MARKER PLACEMENT  07/18/2020   Procedure: FIDUCIAL MARKER PLACEMENT;  Surgeon: Brenna Adine CROME, DO;  Location: [facility];  Service: Pulmonary;;   INTERCOSTAL NERVE BLOCK Right 08/26/2020   Procedure: INTERCOSTAL NERVE BLOCK;  Surgeon: Shyrl Linnie KIDD, MD;  Location: [facility] OR;  Service: Thoracic;  Laterality: Right;   IR THORACENTESIS ASP PLEURAL SPACE W/IMG GUIDE  10/10/2020    NODE DISSECTION Right 08/26/2020   Procedure: NODE DISSECTION;  Surgeon: Shyrl Linnie KIDD, MD;  Location: [facility] OR;  Service: Thoracic;  Laterality: Right;   THORACENTESIS Right 11/04/2020   Procedure: MILANA;  Surgeon: Brenna Adine CROME, DO;  Location: [facility];  Service: Pulmonary;  Laterality: Right;   THORACENTESIS Right 11/29/2020   Procedure: THORACENTESIS;  Surgeon: Brenna Adine CROME, DO;  Location: [facility]COPY;  Service: Pulmonary;  Laterality: Right;   VIDEO BRONCHOSCOPY WITH ENDOBRONCHIAL NAVIGATION N/A 07/18/2020   Procedure: VIDEO BRONCHOSCOPY WITH ENDOBRONCHIAL NAVIGATION;  Surgeon: Brenna Adine CROME, DO;  Location: [facility];  Service: Pulmonary;  Laterality: N/A;   VIDEO BRONCHOSCOPY WITH ENDOBRONCHIAL ULTRASOUND  07/18/2020   Procedure: VIDEO BRONCHOSCOPY WITH ENDOBRONCHIAL ULTRASOUND;  Surgeon: Brenna Adine CROME, DO;  Location: [facility];  Service: Pulmonary;;    REVIEW OF SYSTEMS:  A comprehensive review of systems was negative except for: Respiratory: positive for pleurisy/chest pain   PHYSICAL EXAMINATION: General appearance: alert, cooperative, and no distress Head: Normocephalic, without obvious abnormality, atraumatic Neck: no adenopathy, no JVD, supple,  symmetrical, trachea midline, and thyroid  not enlarged, symmetric, no tenderness/mass/nodules Lymph nodes: Cervical, supraclavicular, and axillary nodes normal. Resp: clear to auscultation bilaterally Back: symmetric, no curvature. ROM normal. No CVA tenderness. Cardio: regular rate and rhythm, S1, S2 normal, no murmur, click, rub or gallop GI: soft, non-tender; bowel sounds normal; no masses,  no organomegaly Extremities: extremities normal, atraumatic, no cyanosis or edema  ECOG PERFORMANCE STATUS: 1 - Symptomatic but completely ambulatory  Blood pressure 139/73, pulse 77, temperature 97.6 F (36.4 C), temperature source Temporal, resp. rate 17, height 5' 4 (1.626 m), weight 172 lb (78 kg), SpO2  99%.  LABORATORY DATA: Lab Results  Component Value Date   WBC 5.9 08/15/2024   HGB 12.2 08/15/2024   HCT 37.0 08/15/2024   MCV 93.4 08/15/2024   PLT 256 08/15/2024      Chemistry      Component Value Date/Time   NA 142 08/15/2024 1133   K 3.7 08/15/2024 1133   CL 108 08/15/2024 1133   CO2 24 08/15/2024 1133   BUN 7 (L) 08/15/2024 1133   CREATININE 1.02 (H) 08/15/2024 1133      Component Value Date/Time   CALCIUM  10.1 08/15/2024 1133   ALKPHOS 77 08/15/2024 1133   AST 21 08/15/2024 1133   ALT 13 08/15/2024 1133   BILITOT 0.5 08/15/2024 1133       RADIOGRAPHIC STUDIES: CT Chest W Contrast Result Date: 08/18/2024 CLINICAL DATA:  Non-small cell lung cancer.  * Tracking Code: BO * EXAM: CT CHEST WITH CONTRAST TECHNIQUE: Multidetector CT imaging of the chest was performed during intravenous contrast administration. RADIATION DOSE REDUCTION: This exam was performed according to the departmental dose-optimization program which includes automated exposure control, adjustment of the mA and/or kV according to patient size and/or use of iterative reconstruction technique. CONTRAST:  75mL OMNIPAQUE  IOHEXOL  300 MG/ML  SOLN COMPARISON:  07/26/2024 and 08/20/2023. FINDINGS: Cardiovascular: Atherosclerotic calcification of the aorta. Heart is at the upper limits of normal in size to mildly enlarged. No pericardial effusion. Mediastinum/Nodes: Heterogeneous and nodular thyroid . Previous thyroid  ultrasound 06/23/2023, at which time no further follow-up was recommended. No pathologically enlarged mediastinal, hilar or axillary lymph nodes. Esophagus is grossly unremarkable. Lungs/Pleura: Centrilobular emphysema. Right upper lobectomy. Similar volume loss in the lateral segment right middle lobe. Additional linear scarring and volume loss in the right lower lobe. Spiculated apical left upper lobe nodule measures 6 mm (5/21), stable. Fiducial marker slightly more inferiorly within the left upper lobe.  No pleural fluid. Airway is otherwise unremarkable. Upper Abdomen: Elevated right hemidiaphragm. Blush of hyperattenuation in the posterior right hepatic lobe, possibly a perfusion anomaly. Intrahepatic and extrahepatic biliary ductal dilatation appears chronic. Low-attenuation lesion in the left kidney, partially imaged. No specific follow-up necessary. Visualized portions of the liver, gallbladder, adrenal glands, kidneys, spleen, pancreas, stomach and bowel are otherwise grossly unremarkable. No upper abdominal adenopathy. Musculoskeletal: Osteopenia.  Degenerative changes in the spine. IMPRESSION: 1. Stable spiculated left upper lobe nodule. Recommend continued attention on follow-up as adenocarcinoma can have this appearance. 2. Right upper lobectomy with associated scarring. 3. 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 4.  Emphysema (ICD10-J43.9). 5.  Aortic atherosclerosis (ICD10-I70.0). Electronically Signed   By: Newell Eke M.D.   On: 08/18/2024 13:40   MR BRAIN WO CONTRAST Result Date: 07/26/2024 EXAM: MRI BRAIN WITHOUT CONTRAST 07/26/2024 09:08:46 AM TECHNIQUE: Multiplanar multisequence MRI of the head/brain was performed without the administration of intravenous contrast. COMPARISON: Head CT 07/26/2024 and MRI 08/15/2021. CLINICAL HISTORY: Stroke, follow  up. FINDINGS: LIMITATIONS: The examination is mildly to moderately motion degraded. BRAIN AND VENTRICLES: No acute infarct, mass, midline shift, hydrocephalus, or extra-axial fluid collection is identified. A moderate sized region of encephalomalacia with hemosiderin staining is again noted in the right parietal lobe related to a remote hemorrhage, and there is associated ex vacuo dilatation of the atrium of the right lateral ventricle. There is also unchanged mild superficial siderosis over the right frontal convexity consistent with remote subarachnoid hemorrhage. There is mild cerebral atrophy. T2 hyperintensities  elsewhere in the cerebral white matter are similar to the prior MRI and are nonspecific but compatible with minimal chronic small vessel ischemic disease. Major intracranial vascular flow voids are preserved. ORBITS: No acute abnormality. SINUSES AND MASTOIDS: Small right mastoid effusion. Clear paranasal sinuses. BONES AND SOFT TISSUES: Normal marrow signal. No acute soft tissue abnormality. IMPRESSION: 1. No acute intracranial abnormality. 2. Right parietal encephalomalacia. Electronically signed by: Dasie Hamburg MD 07/26/2024 09:55 AM EST RP Workstation: [id number]   CT ANGIO CODE STROKE CHEST/ABD/PEL FOR DISSECTION W &/OR WO CONTRAST Result Date: 07/26/2024 EXAM: CTA CHEST, ABDOMEN AND PELVIS WITHOUT AND WITH CONTRAST 07/26/2024 08:00:00 AM TECHNIQUE: CTA of the chest was performed without and with the administration of intravenous contrast. CTA of the abdomen and pelvis was performed without and with the administration of intravenous contrast. Multiplanar reformatted images are provided for review. MIP images are provided for review. Automated exposure control, iterative reconstruction, and/or weight based adjustment of the mA/kV was utilized to reduce the radiation dose to as low as reasonably achievable. COMPARISON: Chest CT with contrast 02/15/2024. CT abdomen and pelvis 02/18/2015. Comparison chest CT 02/13/2022. CLINICAL HISTORY: 78 year old female with acute neuro deficit, stroke suspected, and transient ischemic attack (TIA). FINDINGS: VASCULATURE: AORTA: Mildly tortuous and moderately atherosclerotic thoracic aorta, negative for thoracic aortic dissection or aneurysm. Abdominal aortoiliac calcified atherosclerosis is moderate. Tortuous abdominal aorta, negative for abdominal aortic aneurysm or dissection. major arterial branches in the abdomen and pelvis are patent . Major visible pulmonary arteries appear patent and stable. CHEST: MEDIASTINUM: No mediastinal lymphadenopathy. Heart size is stable  with suggestion of chronic left atrial enlargement. No pericardial effusion. No obvious calcified coronary artery atherosclerosis. The heart and pericardium demonstrate no acute abnormality. LUNGS AND PLEURA: Chronic right upper lobectomy postoperative changes in the right lung and at the right hilum are stable with chronic lung and soft tissue thickening but no progressive or suspicious features. Left upper lobe bronchoscopic biopsy type surgical clips and spiculated left apical lung nodule appears stable (series 6 image 22) this does not appear significantly changed from comparison chest CT 02/13/2022, favoring benign etiology (series 7 image 26 of that exam). Mild respiratory motion. Evidence of underlying centrilobular emphysema. No pleural effusion or acute lung opacity. Residual major airways are patent. THORACIC BONES AND SOFT TISSUES: Thoracic spine kyphoscoliosis associated degenerative thoracic interbody and posterior elements of ankylosis. Chronic right upper lobectomy postoperative changes in the right lung and at the right hilum are stable with chronic lung and soft tissue thickening but no progressive or suspicious features. No acute or suspicious osseous abnormality in the chest. ABDOMEN AND PELVIS: LIVER: Liver enhancement is stable since 2016, with minimal limits. GALLBLADDER AND BILE DUCTS: Abnormally dilated intra and extrahepatic bile ducts. The CBD is dilated 12 to 13 mm diameter and there is an abrupt termination of the dilated bile duct near the papilla on series 13 image 87. Gallbladder appears distended. No pericholecystic inflammation. No discrete biliary stone by CT. SPLEEN: The spleen  is unremarkable. PANCREAS: Stable pancreas since 2016, nondilated main pancreatic duct. 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 KIDNEYS, URETERS AND BLADDER: Chronic and benign left renal mid pole cyst (no follow  up imaging recommended). No stones in the kidneys or ureters. No hydronephrosis. No perinephric or periureteral stranding. Decompressed urinary bladder. GI AND BOWEL: Redundant but decompressed sigmoid colon in the pelvis. No abnormally dilated or inflamed large bowel. Appendix not identified, diminutive or absent. Nondilated small bowel. Decompressed stomach and duodenum. Chronic rectus muscle diastasis at the umbilicus, no overt ventral abdominal hernia. No pneumoperitoneum. No free air. REPRODUCTIVE: Chronically absent uterus, diminutive or absent ovaries. PERITONEUM AND RETROPERITONEUM: No ascites or free air. LYMPH NODES: No lymphadenopathy. ABDOMINAL BONES AND SOFT TISSUES: Chronic L4 lumbar vertebral body heterogeneity appears related to benign hemangioma. Chronic pubic symphysis degeneration. No acute or suspicious osseous lesion identified. Chronic rectus muscle diastasis at the umbilicus, no overt ventral abdominal hernia. IMPRESSION: 1. Abnormally dilated intra- and extrahepatic bile ducts with abrupt termination of the CBD near the papilla. ERCP would likely be the most valuable next step. 2. Aortic atherosclerosis and tortuosity.negative for aortic aneurysm or dissection. 3. No other acute or inflammatory process identified. Stable post treatment appearance of the lungs. Electronically signed by: Helayne Hurst MD 07/26/2024 08:35 AM EST RP Workstation: [id number]   CT ANGIO HEAD NECK W WO CM (CODE STROKE) Result Date: 07/26/2024 EXAM: CTA HEAD AND NECK WITH AND WITHOUT 07/26/2024 08:00:00 AM TECHNIQUE: CTA of the head and neck was performed with and without the administration of intravenous contrast. Multiplanar 2D and/or 3D reformatted images are provided for review. Automated exposure control, iterative reconstruction, and/or weight based adjustment of the mA/kV was utilized to reduce the radiation dose to as low as reasonably achievable. Stenosis of the internal carotid arteries measured using  NASCET criteria. COMPARISON: Head CT reported separately today. CTA chest reported separately today. CLINICAL HISTORY: 78 year old female. Neuro deficit, acute, stroke suspected. Code straight presentation. FINDINGS: CTA NECK: AORTIC ARCH AND ARCH VESSELS: Calcified aortic arch. 3 vessel arch configuration. No dissection or arterial injury. No significant stenosis of the brachiocephalic or subclavian arteries. Proximal left subclavian artery atherosclerosis without stenosis. CERVICAL CAROTID ARTERIES: Tortuous right CCA and right ICA with minimal atherosclerosis, no stenosis. Calcified plaque at the left carotid bifurcation and left ICA bulb. No associated stenosis. Highly tortuous right ICA just below the skull base. Patent right ICA siphon with mild calcified plaque and no stenosis. No dissection or arterial injury. No hemodynamically significant stenosis by NASCET criteria. CERVICAL VERTEBRAL ARTERIES: Normal right vertebral artery origin. Patent right vertebral artery to the vertebrobasilar junction without stenosis. Mild V4 segment atherosclerosis. Normal right PICA origin. Nondominant left vertebral artery. Proximal left vertebral artery venous contrast reflux. No left vertebral artery stenosis. No dissection or arterial injury. SOFT TISSUES: No acute abnormality. BONES: Absent and carious dentition. Mild for age spine degeneration. 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 CTA HEAD: ANTERIOR CIRCULATION: Normal right posterior communicating artery origin. Normal MCA and ACA origins. Normal anterior communicating artery. No significant stenosis of the internal carotid arteries. No significant stenosis of the anterior cerebral arteries. No significant stenosis of the middle cerebral arteries. No aneurysm. POSTERIOR CIRCULATION: Patent basilar artery is tortuous, mildly irregular. No basilar stenosis. Normal SCA and left PCA origins. Fetal type right PCA. Left posterior communicating  artery diminutive or absent. No significant stenosis of the posterior cerebral  arteries. No significant stenosis of the basilar artery. No significant stenosis of the vertebral arteries. No aneurysm. OTHER: Necklace artifact. IMPRESSION: 1. Negative for large vessel occlusion. 2. Arterial tortuosity in the head and neck with minimal atherosclerosis for age. No arterial stenosis. 3. CTA Chest reported separately. Electronically signed by: Helayne Hurst MD 07/26/2024 08:19 AM EST RP Workstation: [id number]   CT HEAD CODE STROKE WO CONTRAST Result Date: 07/26/2024 EXAM: CT HEAD WITHOUT 08/16/2022 TECHNIQUE: CT of the head was performed without the administration of intravenous contrast. Automated exposure control, iterative reconstruction, and/or weight based adjustment of the mA/kV was utilized to reduce the radiation dose to as low as reasonably achievable. COMPARISON: Brain MRI 08/15/2021. Head CT 08/15/2021. CLINICAL HISTORY: 78 year old female. FINDINGS: BRAIN AND VENTRICLES: No acute intracranial hemorrhage. No mass effect or midline shift. No extra-axial fluid collection. No evidence of acute infarct. No hydrocephalus. Chronic superior peri-Rolandic encephalomalacia on the right is stable. Stable underlying brain volume. Mild ex vacuo enlargement of the right lateral ventricle. Calcified atherosclerosis at the skull base. No suspicious intracranial vascular hyperdensity. Elsewhere gray white differentiation is stable and normal for age. No gaze deviation. ORBITS: No acute abnormality. SINUSES AND MASTOIDS: Visible paranasal sinuses, middle ears and mastoids remain well aerated. SOFT TISSUES AND SKULL: No acute skull fracture. No acute soft tissue abnormality. 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 alberta stroke program early CT score (aspects) ----- Ganglionic (caudate, ic, lentiform nucleus, insula, M1-m3): 7 Supraganglionic (m4-m6): 3 Total: 10 IMPRESSION: 1. No acute  intracranial abnormality.  ASPECTS 10. 2. Stable chronic right superior peri-Rolandic encephalomalacia. 3. These results were communicated to Dr. Matthews at 07:34 on 07/26/2024 by text page via the [facility] messaging system. Electronically signed by: Helayne Hurst MD 07/26/2024 07:35 AM EST RP Workstation: [id number]     ASSESSMENT AND PLAN: This is a very pleasant 78 years old African-American female recently diagnosed with a stage IIb (T3, N0, M0) non-small cell lung cancer, adenocarcinoma involving the right upper lobe with 2 nodules. The patient also has evidence for adenocarcinoma of the left upper lobe and subcentimeter nodules in the right lung concerning for stage IV with multifocal disease bilaterally. She is status post right upper lobectomy with lymph node dissection on August 26, 2020 under the care of Dr. Shyrl. Her molecular studies by [facility] showed no actionable mutations. She has PD-L1 expression of 20%. She underwent adjuvant treatment with systemic chemotherapy with cisplatin  75 mg/M2 and Alimta  500 mg/M2 every 3 weeks.  Last dose of treatment was in May 2022 and the patient has been in observation since that time. She had repeat CT scan of the chest performed recently.  I personally and independently reviewed the scan and discussed the result with the patient today.  Her scan showed no concerning findings for disease recurrence or metastasis. Assessment and Plan Assessment & Plan Stage IIB adenocarcinoma of the left upper lobe of the lung, status post lobectomy and adjuvant chemotherapy She remains under surveillance following lobectomy and adjuvant chemotherapy completed in March 2022. Chest CT demonstrates a stable 6 mm nodule in the left upper lobe without evidence of new or progressive disease. She is asymptomatic from a pulmonary perspective and reports persistent discomfort at the surgical incision site, managed with lidocaine  patches. Laboratory studies are unremarkable  except for mildly elevated creatinine, which is not clinically significant. - Reviewed recent chest CT and laboratory results. - Recommended continued surveillance with chest imaging every six months. - Scheduled follow-up visit in six  months. The patient was advised to call immediately if she has any other concerning symptoms in the interval. The patient voices understanding of current disease status and treatment options and is in agreement with the current care plan.  All questions were answered. The patient knows to call the clinic with any problems, questions or concerns. We can certainly see the patient much sooner if necessary. The total time spent in the appointment was 20 minutes.  Disclaimer: This note was dictated with voice recognition software. Similar sounding words can inadvertently be transcribed and may not be corrected upon review.

## 2024-08-28 ENCOUNTER — Ambulatory Visit: Admitting: Gastroenterology

## 2024-09-14 ENCOUNTER — Encounter: Payer: Self-pay | Admitting: Internal Medicine

## 2024-09-20 ENCOUNTER — Encounter: Payer: Self-pay | Admitting: Internal Medicine

## 2024-09-26 ENCOUNTER — Other Ambulatory Visit: Payer: Self-pay | Admitting: Obstetrics and Gynecology

## 2024-09-26 DIAGNOSIS — R928 Other abnormal and inconclusive findings on diagnostic imaging of breast: Secondary | ICD-10-CM

## 2024-09-27 ENCOUNTER — Encounter: Payer: Self-pay | Admitting: Gastroenterology

## 2024-09-27 ENCOUNTER — Ambulatory Visit: Admitting: Gastroenterology

## 2024-09-27 VITALS — BP 140/70 | HR 72 | Ht 63.5 in | Wt 164.0 lb

## 2024-09-27 DIAGNOSIS — C3412 Malignant neoplasm of upper lobe, left bronchus or lung: Secondary | ICD-10-CM

## 2024-09-27 DIAGNOSIS — R935 Abnormal findings on diagnostic imaging of other abdominal regions, including retroperitoneum: Secondary | ICD-10-CM

## 2024-09-27 DIAGNOSIS — C3491 Malignant neoplasm of unspecified part of right bronchus or lung: Secondary | ICD-10-CM

## 2024-09-27 DIAGNOSIS — K838 Other specified diseases of biliary tract: Secondary | ICD-10-CM | POA: Diagnosis not present

## 2024-09-27 DIAGNOSIS — R932 Abnormal findings on diagnostic imaging of liver and biliary tract: Secondary | ICD-10-CM | POA: Diagnosis not present

## 2024-09-27 DIAGNOSIS — K839 Disease of biliary tract, unspecified: Secondary | ICD-10-CM

## 2024-09-27 NOTE — Progress Notes (Signed)
 "  Chief Complaint: Abnormal CT Primary GI MD: Dr. Abran  HPI: Discussed the use of AI scribe software for clinical note transcription with the patient, who gave verbal consent to proceed.  History of Present Illness  Tonya [name] is a 79 year old female with stage IV non-small cell lung cancer and prior biliary ductal dilation who presents for evaluation of abnormal biliary ductal dilation on recent imaging.  Biliary ductal dilation was first identified in 2016 on CT, with subsequent MRCP showing mild intrahepatic and extrahepatic ductal dilation and a common bile duct measuring 9 to 10 mm. No choledocholithiasis was seen and the pancreas appeared normal. ERCP was not performed at that time.  In November 2025, she presented to the emergency department with intractable nausea, vomiting, and headache. Imaging at that time revealed abnormally dilated intra- and extrahepatic bile ducts, with the common bile duct measuring 12 to 13 mm and abrupt termination of the distal bile duct near the papilla. The gallbladder was distended. Currently, she denies abdominal pain, nausea, vomiting, changes in bowel habits, jaundice, or dark urine. Weight fluctuates and she has lost a few pounds, which she states is typical for her.  Non-small cell lung cancer (adenocarcinoma) was diagnosed in December 2021, involving the right upper lobe with additional adenocarcinoma in a left upper lobe nodule and subcentimeter right lung nodules, consistent with stage IV multifocal disease. She is status post right upper lobectomy with lymph node dissection and has received chemotherapy. Oncology follow-up occurs every six months. She is also undergoing evaluation for a left breast abnormality.    PREVIOUS GI WORKUP   Colonoscopy/2025 - Moderate hemorrhoids - Otherwise normal  Past Medical History:  Diagnosis Date   Adenoma of left adrenal gland    Cancer (HCC)    Lung   Colon polyps    hyperplastic    Complication of anesthesia    Very emotional and cries after anesthesia   GERD (gastroesophageal reflux disease)    High blood pressure 03/25/2021   Kidney stone    Liver lesion    Microhematuria    Stroke [facility])     Past Surgical History:  Procedure Laterality Date   ABDOMINAL HYSTERECTOMY     APPENDECTOMY     BRONCHIAL BIOPSY  07/18/2020   Procedure: BRONCHIAL BIOPSIES;  Surgeon: Brenna Adine CROME, DO;  Location: [facility] ENDOSCOPY;  Service: Pulmonary;;   BRONCHIAL BRUSHINGS  07/18/2020   Procedure: BRONCHIAL BRUSHINGS;  Surgeon: Brenna Adine CROME, DO;  Location: [facility];  Service: Pulmonary;;   BRONCHIAL NEEDLE ASPIRATION BIOPSY  07/18/2020   Procedure: BRONCHIAL NEEDLE ASPIRATION BIOPSIES;  Surgeon: Brenna Adine CROME, DO;  Location: [facility];  Service: Pulmonary;;   BRONCHIAL WASHINGS  07/18/2020   Procedure: BRONCHIAL WASHINGS;  Surgeon: Brenna Adine CROME, DO;  Location: [facility];  Service: Pulmonary;;   FIDUCIAL MARKER PLACEMENT  07/18/2020   Procedure: FIDUCIAL MARKER PLACEMENT;  Surgeon: Brenna Adine CROME, DO;  Location: [facility] ENDOS[facility];  Service: Pulmonary;;   INTERCOSTAL NERVE BLOCK Right 08/26/2020   Procedure: INTERCOSTAL NERVE BLOCK;  Surgeon: Shyrl Linnie KIDD, MD;  Location: [facility] OR;  Service: Thoracic;  Laterality: Right;   IR THORACENTESIS RIGHT ASP PLEURAL SPACE W/IMG GUIDE  10/10/2020   NODE DISSECTION Right 08/26/2020   Procedure: NODE DISSECTION;  Surgeon: Shyrl Linnie KIDD, MD;  Location: [facility] OR;  Service: Thoracic;  Laterality: Right;   THORACENTESIS Right 11/04/2020   Procedure: MILANA;  Surgeon: Brenna Adine CROME, DO;  Location: [facility];  Service: Pulmonary;  Laterality: Right;   THORACENTESIS Right 11/29/2020   Procedure: THORACENTESIS;  Surgeon: Brenna Adine CROME, DO;  Location: [facility] ENDOSCOPY;  Service: Pulmonary;  Laterality: Right;   VIDEO BRONCHOSCOPY WITH ENDOBRONCHIAL NAVIGATION N/A 07/18/2020   Procedure: VIDEO BRONCHOSCOPY WITH ENDOBRONCHIAL  NAVIGATION;  Surgeon: Brenna Adine CROME, DO;  Location: [facility];  Service: Pulmonary;  Laterality: N/A;   VIDEO BRONCHOSCOPY WITH ENDOBRONCHIAL ULTRASOUND  07/18/2020   Procedure: VIDEO BRONCHOSCOPY WITH ENDOBRONCHIAL ULTRASOUND;  Surgeon: Brenna Adine CROME, DO;  Location: [facility]OS[facility];  Service: Pulmonary;;    Current Outpatient Medications  Medication Sig Dispense Refill   acetaminophen  (TYLENOL ) 325 MG tablet Take 1-2 tablets (325-650 mg total) by mouth every 4 (four) hours as needed for mild pain.     albuterol  (VENTOLIN  HFA) 108 (90 Base) MCG/ACT inhaler Inhale 2 puffs into the lungs every 6 (six) hours as needed for wheezing or shortness of breath. 8 g 2   ALPRAZolam  (XANAX ) 1 MG tablet Take 1 mg by mouth at bedtime as needed for sleep.     Calcium  Carbonate-Vitamin D (CALCIUM  + D PO) Take 1 tablet by mouth 2 (two) times daily.     clotrimazole-betamethasone (LOTRISONE) cream Apply topically.     lidocaine  (LIDODERM ) 5 % Place 3 patches onto the skin daily. Apply at 8 am and remove at 8 pm daily. 90 patch 0   omeprazole (PRILOSEC) 40 MG capsule Take 40 mg by mouth daily.     alendronate (FOSAMAX) 70 MG tablet Take 70 mg by mouth every Saturday. (Patient not taking: Reported on 09/27/2024)     rosuvastatin  (CRESTOR ) 10 MG tablet Take 10 mg by mouth at bedtime. (Patient not taking: Reported on 09/27/2024)     No current facility-administered medications for this visit.    Allergies as of 09/27/2024 - Review Complete 09/27/2024  Allergen Reaction Noted   Other Anaphylaxis 11/19/2020   Tizanidine  Other (See Comments) 03/18/2021   Penicillins Hives 07/30/2008    Family History  Problem Relation Age of Onset   Arrhythmia Mother        s/p Pacer    Heart attack Father    Heart disease Sister    Colon cancer Neg Hx    Colon polyps Neg Hx    Esophageal cancer Neg Hx    Stomach cancer Neg Hx    Rectal cancer Neg Hx     Social History   Socioeconomic History   Marital status:  Married    Spouse name: Not on file   Number of children: 3   Years of education: Not on file   Highest education level: Not on file  Occupational History   Occupation: retired  Tobacco Use   Smoking status: Former    Current packs/day: 0.00    Average packs/day: 0.3 packs/day for 60.0 years (18.0 ttl pk-yrs)    Types: Cigarettes    Start date: 06/11/1960    Quit date: 06/11/2020    Years since quitting: 4.2   Smokeless tobacco: Never  Vaping Use   Vaping status: Never Used  Substance and Sexual Activity   Alcohol use: Yes    Comment: 0.5 drink per month   Drug use: No   Sexual activity: Not Currently    Birth control/protection: Surgical    Comment: Hysterectomy  Other Topics Concern   Not on file  Social History Narrative   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    Social Drivers of Health   Tobacco Use: Medium Risk (09/27/2024)   Patient  History    Smoking Tobacco Use: Former    Smokeless Tobacco Use: Never    Passive Exposure: Not on Actuary Strain: Not on file  Food Insecurity: Not on file  Transportation Needs: Not on file  Physical Activity: Not on file  Stress: Not on file  Social Connections: Unknown (01/20/2022)   [facility]   Social Network    Social Network: Not on file  Intimate Partner Violence: Unknown (12/12/2021)   Received from [facility]   HITS    Physically Hurt: Not on file    Insult or Talk Down To: Not on file    Threaten Physical Harm: Not on file    Scream or Curse: Not on file  Depression (PHQ2-9): Low Risk (11/06/2021)   Depression (PHQ2-9)    PHQ-2 Score: 0  Alcohol Screen: Not on file  Housing: Not on file  Utilities: Not on file  Health Literacy: Not on file    Review of Systems:    Constitutional: No weight loss, fever, chills, weakness or fatigue HEENT: Eyes: No change in vision               Ears, Nose, Throat:  No change in hearing or congestion Skin: No rash or itching Cardiovascular: No chest pain,  chest pressure or palpitations   Respiratory: No SOB or cough Gastrointestinal: See HPI and otherwise negative Genitourinary: No dysuria or change in urinary frequency Neurological: No headache, dizziness or syncope Musculoskeletal: No new muscle or joint pain Hematologic: No bleeding or bruising Psychiatric: No history of depression or anxiety    Physical Exam:  Vital signs: BP (!) 140/70 (BP Location: Left Arm, Patient Position: Sitting, Cuff Size: Normal)   Pulse 72   Ht 5' 3.5 (1.613 m) Comment: height measured without shoes  Wt 164 lb (74.4 kg)   BMI 28.60 kg/m   Constitutional: NAD, alert and cooperative.  Appears significantly younger than stated age Head:  Normocephalic and atraumatic. Eyes:   PEERL, EOMI. No icterus. Conjunctiva pink. Respiratory: Respirations even and unlabored. Lungs clear to auscultation bilaterally.   No wheezes, crackles, or rhonchi.  Cardiovascular:  Regular rate and rhythm. No peripheral edema, cyanosis or pallor.  Gastrointestinal:  Soft, nondistended, nontender. No rebound or guarding. Normal bowel sounds. No appreciable masses or hepatomegaly. Rectal:  Declines Msk:  Symmetrical without gross deformities. Without edema, no deformity or joint abnormality.  Neurologic:  Alert and  oriented x4;  grossly normal neurologically.  Skin:   Dry and intact without significant lesions or rashes. Psychiatric: Oriented to person, place and time. Demonstrates good judgement and reason without abnormal affect or behaviors.  Physical Exam    RELEVANT LABS AND IMAGING: CBC    Component Value Date/Time   WBC 5.9 08/15/2024 1133   WBC 7.3 07/26/2024 0725   RBC 3.96 08/15/2024 1133   HGB 12.2 08/15/2024 1133   HCT 37.0 08/15/2024 1133   PLT 256 08/15/2024 1133   MCV 93.4 08/15/2024 1133   MCH 30.8 08/15/2024 1133   MCHC 33.0 08/15/2024 1133   RDW 13.1 08/15/2024 1133   LYMPHSABS 2.0 08/15/2024 1133   MONOABS 0.6 08/15/2024 1133   EOSABS 0.5  08/15/2024 1133   BASOSABS 0.1 08/15/2024 1133    CMP     Component Value Date/Time   NA 142 08/15/2024 1133   K 3.7 08/15/2024 1133   CL 108 08/15/2024 1133   CO2 24 08/15/2024 1133   GLUCOSE 90 08/15/2024 1133  BUN 7 (L) 08/15/2024 1133   CREATININE 1.02 (H) 08/15/2024 1133   CALCIUM  10.1 08/15/2024 1133   PROT 7.2 08/15/2024 1133   ALBUMIN 4.0 08/15/2024 1133   AST 21 08/15/2024 1133   ALT 13 08/15/2024 1133   ALKPHOS 77 08/15/2024 1133   BILITOT 0.5 08/15/2024 1133   GFRNONAA 57 (L) 08/15/2024 1133   GFRAA >60 04/01/2020 1706     Assessment/Plan:   Abnormal CT with dilated intra/extrahepatic bile ducts MRCP 2016 with common bile duct measuring 9 to 10 mm with mild intra/extrahepatic duct dilation. CTA chest/abdomen/pelvis 07/2024 with CBD dilated 12 to 13 mm with abrupt termination of the dilated bile duct near the papilla.  Distended gallbladder.  Nondilated pancreatic duct. Normal LFTs Extensive discussion with patient about finding on CTA of dilated CBD.  Patient does not have elevated LFTs or any symptoms.  Discussed option of MRCP for further characterization.  With everything patient has going on in regards to her lung cancer treatment and potential breast biopsy she feels like she would like to think about the decision to even pursue MRCP for further characterization and she decided she would like to call us  back.  She also declined labs today. 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  Under surveillance following lobectomy and chemotherapy completed in March 2022. - Follows with oncology with surveillance every 6 months  Tyhir Schwan Mollie RIGGERS [facility] 09/27/2024, 10:10  AM  Cc: Ilah Crigler, MD "

## 2024-09-27 NOTE — Progress Notes (Signed)
 Reviewed. 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  Slightly increased on recent CT compared to remote MRI. Liver tests are normal.  Given these 2 facts, the clinical concern for something serious is relatively low. However, if there were to be further interrogation of this abnormality, it would be endoscopic ultrasound (not repeat MRI or ERCP straightaway). If endoscopic ultrasound sought, coordinate with Dr. Wilhelmenia. Thanks Dr. Abran

## 2024-10-04 ENCOUNTER — Ambulatory Visit
Admission: RE | Admit: 2024-10-04 | Discharge: 2024-10-04 | Disposition: A | Source: Ambulatory Visit | Attending: Obstetrics and Gynecology | Admitting: Obstetrics and Gynecology

## 2024-10-04 ENCOUNTER — Other Ambulatory Visit: Payer: Self-pay | Admitting: Obstetrics and Gynecology

## 2024-10-04 DIAGNOSIS — N6321 Unspecified lump in the left breast, upper outer quadrant: Secondary | ICD-10-CM

## 2024-10-04 DIAGNOSIS — R928 Other abnormal and inconclusive findings on diagnostic imaging of breast: Secondary | ICD-10-CM

## 2024-10-06 ENCOUNTER — Ambulatory Visit
Admission: RE | Admit: 2024-10-06 | Discharge: 2024-10-06 | Disposition: A | Source: Ambulatory Visit | Attending: Obstetrics and Gynecology | Admitting: Obstetrics and Gynecology

## 2024-10-06 DIAGNOSIS — N6321 Unspecified lump in the left breast, upper outer quadrant: Secondary | ICD-10-CM

## 2024-10-10 LAB — SURGICAL PATHOLOGY

## 2025-02-12 ENCOUNTER — Inpatient Hospital Stay

## 2025-02-19 ENCOUNTER — Inpatient Hospital Stay: Admitting: Internal Medicine
